# Patient Record
Sex: Male | Born: 1960 | Race: White | Hispanic: No | Smoking: Former smoker
Health system: Southern US, Community
[De-identification: ages and names within clinical notes are randomized; demographics above are authoritative.]

## PROBLEM LIST (undated history)

## (undated) DIAGNOSIS — F32A Depression, unspecified: Secondary | ICD-10-CM

## (undated) DIAGNOSIS — G8929 Other chronic pain: Secondary | ICD-10-CM

## (undated) DIAGNOSIS — E1142 Type 2 diabetes mellitus with diabetic polyneuropathy: Secondary | ICD-10-CM

## (undated) DIAGNOSIS — F419 Anxiety disorder, unspecified: Secondary | ICD-10-CM

## (undated) DIAGNOSIS — K219 Gastro-esophageal reflux disease without esophagitis: Secondary | ICD-10-CM

## (undated) DIAGNOSIS — M545 Low back pain, unspecified: Secondary | ICD-10-CM

## (undated) DIAGNOSIS — Z8679 Personal history of other diseases of the circulatory system: Secondary | ICD-10-CM

## (undated) DIAGNOSIS — I10 Essential (primary) hypertension: Secondary | ICD-10-CM

## (undated) DIAGNOSIS — I499 Cardiac arrhythmia, unspecified: Secondary | ICD-10-CM

## (undated) DIAGNOSIS — F410 Panic disorder [episodic paroxysmal anxiety] without agoraphobia: Secondary | ICD-10-CM

## (undated) DIAGNOSIS — K5900 Constipation, unspecified: Secondary | ICD-10-CM

## (undated) DIAGNOSIS — R7303 Prediabetes: Secondary | ICD-10-CM

## (undated) DIAGNOSIS — E559 Vitamin D deficiency, unspecified: Secondary | ICD-10-CM

## (undated) DIAGNOSIS — F909 Attention-deficit hyperactivity disorder, unspecified type: Secondary | ICD-10-CM

## (undated) DIAGNOSIS — M6702 Short Achilles tendon (acquired), left ankle: Secondary | ICD-10-CM

## (undated) DIAGNOSIS — M1990 Unspecified osteoarthritis, unspecified site: Secondary | ICD-10-CM

## (undated) DIAGNOSIS — M2550 Pain in unspecified joint: Secondary | ICD-10-CM

## (undated) DIAGNOSIS — I209 Angina pectoris, unspecified: Secondary | ICD-10-CM

## (undated) DIAGNOSIS — R519 Headache, unspecified: Secondary | ICD-10-CM

## (undated) DIAGNOSIS — E781 Pure hyperglyceridemia: Secondary | ICD-10-CM

## (undated) DIAGNOSIS — Z9889 Other specified postprocedural states: Secondary | ICD-10-CM

## (undated) DIAGNOSIS — K594 Anal spasm: Secondary | ICD-10-CM

## (undated) DIAGNOSIS — E119 Type 2 diabetes mellitus without complications: Secondary | ICD-10-CM

## (undated) DIAGNOSIS — R51 Headache: Secondary | ICD-10-CM

## (undated) DIAGNOSIS — F329 Major depressive disorder, single episode, unspecified: Secondary | ICD-10-CM

## (undated) DIAGNOSIS — G629 Polyneuropathy, unspecified: Secondary | ICD-10-CM

## (undated) DIAGNOSIS — E7800 Pure hypercholesterolemia, unspecified: Secondary | ICD-10-CM

## (undated) DIAGNOSIS — G4733 Obstructive sleep apnea (adult) (pediatric): Secondary | ICD-10-CM

## (undated) DIAGNOSIS — Z9989 Dependence on other enabling machines and devices: Secondary | ICD-10-CM

## (undated) DIAGNOSIS — F319 Bipolar disorder, unspecified: Secondary | ICD-10-CM

## (undated) DIAGNOSIS — K611 Rectal abscess: Secondary | ICD-10-CM

## (undated) HISTORY — DX: Pain in unspecified joint: M25.50

## (undated) HISTORY — DX: Anal spasm: K59.4

## (undated) HISTORY — DX: Short Achilles tendon (acquired), left ankle: M67.02

## (undated) HISTORY — PX: COLONOSCOPY: SHX174

## (undated) HISTORY — DX: Vitamin D deficiency, unspecified: E55.9

## (undated) HISTORY — DX: Essential (primary) hypertension: I10

## (undated) HISTORY — DX: Attention-deficit hyperactivity disorder, unspecified type: F90.9

## (undated) HISTORY — DX: Rectal abscess: K61.1

## (undated) HISTORY — PX: HERNIA REPAIR: SHX51

## (undated) HISTORY — DX: Type 2 diabetes mellitus with diabetic polyneuropathy: E11.42

## (undated) HISTORY — DX: Prediabetes: R73.03

## (undated) HISTORY — PX: SHOULDER ARTHROSCOPY: SHX128

## (undated) HISTORY — DX: Constipation, unspecified: K59.00

---

## 1982-12-28 HISTORY — PX: APPENDECTOMY: SHX54

## 1999-12-29 DIAGNOSIS — K594 Anal spasm: Secondary | ICD-10-CM

## 1999-12-29 HISTORY — DX: Anal spasm: K59.4

## 2000-05-27 ENCOUNTER — Emergency Department (HOSPITAL_COMMUNITY): Admission: EM | Admit: 2000-05-27 | Discharge: 2000-05-27 | Payer: Self-pay | Admitting: *Deleted

## 2000-08-05 ENCOUNTER — Ambulatory Visit (HOSPITAL_COMMUNITY): Admission: RE | Admit: 2000-08-05 | Discharge: 2000-08-05 | Payer: Self-pay | Admitting: General Surgery

## 2000-08-05 ENCOUNTER — Encounter (INDEPENDENT_AMBULATORY_CARE_PROVIDER_SITE_OTHER): Payer: Self-pay | Admitting: Specialist

## 2000-08-05 HISTORY — PX: ANAL FISSURE REPAIR: SHX2312

## 2000-10-01 ENCOUNTER — Encounter: Admission: RE | Admit: 2000-10-01 | Discharge: 2000-10-01 | Payer: Self-pay | Admitting: Gastroenterology

## 2000-10-01 ENCOUNTER — Encounter: Payer: Self-pay | Admitting: Gastroenterology

## 2000-10-25 ENCOUNTER — Ambulatory Visit (HOSPITAL_COMMUNITY): Admission: RE | Admit: 2000-10-25 | Discharge: 2000-10-25 | Payer: Self-pay | Admitting: Gastroenterology

## 2000-10-25 ENCOUNTER — Encounter (INDEPENDENT_AMBULATORY_CARE_PROVIDER_SITE_OTHER): Payer: Self-pay

## 2000-12-09 ENCOUNTER — Encounter: Payer: Self-pay | Admitting: Gastroenterology

## 2000-12-09 ENCOUNTER — Ambulatory Visit (HOSPITAL_COMMUNITY): Admission: RE | Admit: 2000-12-09 | Discharge: 2000-12-09 | Payer: Self-pay | Admitting: Gastroenterology

## 2001-02-14 ENCOUNTER — Encounter: Payer: Self-pay | Admitting: Family Medicine

## 2001-02-14 ENCOUNTER — Ambulatory Visit (HOSPITAL_COMMUNITY): Admission: RE | Admit: 2001-02-14 | Discharge: 2001-02-14 | Payer: Self-pay | Admitting: Family Medicine

## 2002-09-16 ENCOUNTER — Encounter: Payer: Self-pay | Admitting: Specialist

## 2002-09-16 ENCOUNTER — Ambulatory Visit (HOSPITAL_COMMUNITY): Admission: RE | Admit: 2002-09-16 | Discharge: 2002-09-16 | Payer: Self-pay | Admitting: Specialist

## 2002-11-22 ENCOUNTER — Encounter: Payer: Self-pay | Admitting: Family Medicine

## 2002-11-22 ENCOUNTER — Ambulatory Visit (HOSPITAL_COMMUNITY): Admission: RE | Admit: 2002-11-22 | Discharge: 2002-11-22 | Payer: Self-pay | Admitting: Family Medicine

## 2003-01-23 ENCOUNTER — Encounter: Payer: Self-pay | Admitting: Family Medicine

## 2003-01-23 ENCOUNTER — Ambulatory Visit (HOSPITAL_COMMUNITY): Admission: RE | Admit: 2003-01-23 | Discharge: 2003-01-23 | Payer: Self-pay | Admitting: Family Medicine

## 2003-02-10 ENCOUNTER — Ambulatory Visit (HOSPITAL_COMMUNITY): Admission: RE | Admit: 2003-02-10 | Discharge: 2003-02-10 | Payer: Self-pay | Admitting: Orthopedic Surgery

## 2003-03-13 ENCOUNTER — Ambulatory Visit (HOSPITAL_COMMUNITY): Admission: RE | Admit: 2003-03-13 | Discharge: 2003-03-13 | Payer: Self-pay | Admitting: Orthopedic Surgery

## 2003-03-13 ENCOUNTER — Encounter: Payer: Self-pay | Admitting: Orthopedic Surgery

## 2003-06-14 ENCOUNTER — Emergency Department (HOSPITAL_COMMUNITY): Admission: EM | Admit: 2003-06-14 | Discharge: 2003-06-15 | Payer: Self-pay

## 2007-03-10 ENCOUNTER — Ambulatory Visit (HOSPITAL_BASED_OUTPATIENT_CLINIC_OR_DEPARTMENT_OTHER): Admission: RE | Admit: 2007-03-10 | Discharge: 2007-03-10 | Payer: Self-pay | Admitting: Otolaryngology

## 2007-03-13 ENCOUNTER — Ambulatory Visit: Payer: Self-pay | Admitting: Internal Medicine

## 2007-03-14 ENCOUNTER — Encounter: Admission: RE | Admit: 2007-03-14 | Discharge: 2007-03-14 | Payer: Self-pay | Admitting: Otolaryngology

## 2007-04-14 ENCOUNTER — Emergency Department (HOSPITAL_COMMUNITY): Admission: EM | Admit: 2007-04-14 | Discharge: 2007-04-15 | Payer: Self-pay | Admitting: Emergency Medicine

## 2007-08-08 ENCOUNTER — Ambulatory Visit (HOSPITAL_COMMUNITY): Admission: RE | Admit: 2007-08-08 | Discharge: 2007-08-09 | Payer: Self-pay | Admitting: Orthopedic Surgery

## 2007-08-08 HISTORY — PX: SHOULDER ARTHROSCOPY W/ LABRAL REPAIR: SHX2399

## 2007-08-10 ENCOUNTER — Emergency Department (HOSPITAL_COMMUNITY): Admission: EM | Admit: 2007-08-10 | Discharge: 2007-08-10 | Payer: Self-pay | Admitting: Emergency Medicine

## 2008-09-19 ENCOUNTER — Ambulatory Visit: Payer: Self-pay | Admitting: Orthopedic Surgery

## 2008-09-19 DIAGNOSIS — M546 Pain in thoracic spine: Secondary | ICD-10-CM | POA: Insufficient documentation

## 2008-09-19 DIAGNOSIS — M545 Low back pain, unspecified: Secondary | ICD-10-CM | POA: Insufficient documentation

## 2008-09-19 DIAGNOSIS — M542 Cervicalgia: Secondary | ICD-10-CM

## 2008-09-19 DIAGNOSIS — M479 Spondylosis, unspecified: Secondary | ICD-10-CM | POA: Insufficient documentation

## 2008-09-19 HISTORY — DX: Spondylosis, unspecified: M47.9

## 2008-09-19 HISTORY — DX: Pain in thoracic spine: M54.6

## 2008-09-19 HISTORY — DX: Low back pain, unspecified: M54.50

## 2008-09-21 ENCOUNTER — Telehealth: Payer: Self-pay | Admitting: Orthopedic Surgery

## 2008-09-24 ENCOUNTER — Encounter: Payer: Self-pay | Admitting: Orthopedic Surgery

## 2008-10-04 ENCOUNTER — Ambulatory Visit: Payer: Self-pay | Admitting: Orthopedic Surgery

## 2008-10-04 ENCOUNTER — Telehealth: Payer: Self-pay | Admitting: Orthopedic Surgery

## 2008-10-09 ENCOUNTER — Encounter: Payer: Self-pay | Admitting: Orthopedic Surgery

## 2008-10-09 ENCOUNTER — Telehealth: Payer: Self-pay | Admitting: Orthopedic Surgery

## 2008-10-09 ENCOUNTER — Encounter: Admission: RE | Admit: 2008-10-09 | Discharge: 2008-12-07 | Payer: Self-pay | Admitting: Orthopedic Surgery

## 2008-10-10 ENCOUNTER — Encounter: Payer: Self-pay | Admitting: Orthopedic Surgery

## 2008-10-11 ENCOUNTER — Encounter: Admission: RE | Admit: 2008-10-11 | Discharge: 2008-10-11 | Payer: Self-pay | Admitting: Orthopedic Surgery

## 2008-10-11 ENCOUNTER — Telehealth: Payer: Self-pay | Admitting: Orthopedic Surgery

## 2008-10-12 ENCOUNTER — Encounter: Payer: Self-pay | Admitting: Orthopedic Surgery

## 2008-10-15 ENCOUNTER — Telehealth: Payer: Self-pay | Admitting: Orthopedic Surgery

## 2008-10-16 ENCOUNTER — Telehealth: Payer: Self-pay | Admitting: Orthopedic Surgery

## 2008-10-17 ENCOUNTER — Telehealth: Payer: Self-pay | Admitting: Orthopedic Surgery

## 2008-10-22 ENCOUNTER — Telehealth: Payer: Self-pay | Admitting: Orthopedic Surgery

## 2008-10-23 ENCOUNTER — Telehealth: Payer: Self-pay | Admitting: Orthopedic Surgery

## 2008-10-24 ENCOUNTER — Encounter: Payer: Self-pay | Admitting: Orthopedic Surgery

## 2008-10-26 ENCOUNTER — Encounter: Payer: Self-pay | Admitting: Orthopedic Surgery

## 2008-11-02 ENCOUNTER — Telehealth: Payer: Self-pay | Admitting: Orthopedic Surgery

## 2008-11-07 ENCOUNTER — Encounter: Payer: Self-pay | Admitting: Orthopedic Surgery

## 2008-11-16 ENCOUNTER — Encounter: Payer: Self-pay | Admitting: Orthopedic Surgery

## 2008-11-23 ENCOUNTER — Encounter: Payer: Self-pay | Admitting: Orthopedic Surgery

## 2008-12-03 ENCOUNTER — Ambulatory Visit (HOSPITAL_COMMUNITY): Admission: RE | Admit: 2008-12-03 | Discharge: 2008-12-03 | Payer: Self-pay | Admitting: Neurological Surgery

## 2008-12-07 ENCOUNTER — Encounter: Payer: Self-pay | Admitting: Orthopedic Surgery

## 2009-01-08 ENCOUNTER — Encounter: Admission: RE | Admit: 2009-01-08 | Discharge: 2009-01-08 | Payer: Self-pay | Admitting: Neurological Surgery

## 2009-01-15 ENCOUNTER — Telehealth: Payer: Self-pay | Admitting: Orthopedic Surgery

## 2009-01-24 ENCOUNTER — Telehealth: Payer: Self-pay | Admitting: Orthopedic Surgery

## 2009-02-04 ENCOUNTER — Telehealth: Payer: Self-pay | Admitting: Orthopedic Surgery

## 2009-02-07 ENCOUNTER — Encounter: Payer: Self-pay | Admitting: Orthopedic Surgery

## 2009-02-20 ENCOUNTER — Encounter: Payer: Self-pay | Admitting: Orthopedic Surgery

## 2009-03-13 ENCOUNTER — Encounter: Payer: Self-pay | Admitting: Orthopedic Surgery

## 2009-10-24 ENCOUNTER — Encounter: Payer: Self-pay | Admitting: Orthopedic Surgery

## 2009-10-31 ENCOUNTER — Telehealth: Payer: Self-pay | Admitting: Orthopedic Surgery

## 2009-12-24 ENCOUNTER — Ambulatory Visit: Payer: Self-pay | Admitting: Diagnostic Radiology

## 2009-12-24 ENCOUNTER — Emergency Department (HOSPITAL_BASED_OUTPATIENT_CLINIC_OR_DEPARTMENT_OTHER): Admission: EM | Admit: 2009-12-24 | Discharge: 2009-12-24 | Payer: Self-pay | Admitting: Emergency Medicine

## 2009-12-28 HISTORY — PX: COLONOSCOPY: SHX174

## 2010-05-11 ENCOUNTER — Encounter: Payer: Self-pay | Admitting: Orthopedic Surgery

## 2010-10-02 ENCOUNTER — Telehealth: Payer: Self-pay | Admitting: Orthopedic Surgery

## 2010-10-08 ENCOUNTER — Ambulatory Visit: Payer: Self-pay | Admitting: Orthopedic Surgery

## 2010-10-08 ENCOUNTER — Encounter (INDEPENDENT_AMBULATORY_CARE_PROVIDER_SITE_OTHER): Payer: Self-pay | Admitting: *Deleted

## 2010-10-09 ENCOUNTER — Encounter: Payer: Self-pay | Admitting: Orthopedic Surgery

## 2010-10-15 ENCOUNTER — Telehealth: Payer: Self-pay | Admitting: Orthopedic Surgery

## 2010-10-20 ENCOUNTER — Telehealth (INDEPENDENT_AMBULATORY_CARE_PROVIDER_SITE_OTHER): Payer: Self-pay | Admitting: *Deleted

## 2010-10-21 ENCOUNTER — Telehealth: Payer: Self-pay | Admitting: Orthopedic Surgery

## 2010-10-24 ENCOUNTER — Encounter: Admission: RE | Admit: 2010-10-24 | Discharge: 2010-10-24 | Payer: Self-pay | Admitting: Orthopedic Surgery

## 2010-10-27 ENCOUNTER — Encounter: Payer: Self-pay | Admitting: Orthopedic Surgery

## 2010-10-27 ENCOUNTER — Ambulatory Visit (HOSPITAL_COMMUNITY): Admission: RE | Admit: 2010-10-27 | Discharge: 2010-10-27 | Payer: Self-pay | Admitting: General Surgery

## 2010-10-27 HISTORY — PX: UMBILICAL HERNIA REPAIR: SHX196

## 2010-10-28 ENCOUNTER — Telehealth: Payer: Self-pay | Admitting: Orthopedic Surgery

## 2010-11-03 ENCOUNTER — Ambulatory Visit: Payer: Self-pay | Admitting: Diagnostic Radiology

## 2010-11-03 ENCOUNTER — Encounter: Payer: Self-pay | Admitting: Emergency Medicine

## 2010-11-03 ENCOUNTER — Observation Stay (HOSPITAL_COMMUNITY): Admission: EM | Admit: 2010-11-03 | Discharge: 2010-11-04 | Payer: Self-pay | Admitting: Emergency Medicine

## 2010-11-04 ENCOUNTER — Encounter (INDEPENDENT_AMBULATORY_CARE_PROVIDER_SITE_OTHER): Payer: Self-pay | Admitting: Emergency Medicine

## 2010-11-12 ENCOUNTER — Encounter: Admission: RE | Admit: 2010-11-12 | Discharge: 2010-11-12 | Payer: Self-pay | Admitting: Orthopedic Surgery

## 2011-01-18 ENCOUNTER — Encounter: Payer: Self-pay | Admitting: Family Medicine

## 2011-01-27 NOTE — Progress Notes (Signed)
Summary: followup on MRI  Phone Note Call from Patient   Summary of Call: Gary Grimes left a message for you to call him to followup on MRI results. Per carol, report is on your desk. Gary Grimes has the film. His # [id number] Initial call taken by: Gary Grimes,  October 15, 2010 2:14 PM

## 2011-01-27 NOTE — Progress Notes (Signed)
Summary: Having severe back pain  Phone Note Call from Patient   Caller: Patient Call For: Dr. Romeo Apple Summary of Call: Patient is having a bad back episode since Monday, with alot of pain. He has been taking Ibuprofen, Tramadol, and using a Tens unit. He has [employer] and he thinks that Dr. Danielle Dess needs a new referral. I told him to call Dr. Verlee Rossetti office to see if they could do anything for him until you could. His # is [phone]. I told him you would not be in the office until Monday. Initial call taken by: Waldon Reining,  October 02, 2010 4:54 PM

## 2011-01-27 NOTE — Progress Notes (Signed)
Summary: call back from patient about order for [facility]  Phone Note Call from Patient   Caller: Patient Summary of Call: Patient called to relay that per Dr Harrison's call to him over the weekend, Dr Romeo Apple advised  "call to have the order writen for epidural injections"  at [facility].   Please advise. Patient contact phone/Cell ph [phone] Initial call taken by: Cammie Sickle,  October 20, 2010 1:32 PM  Follow-up for Phone Call        schedule for Lumbar ESI series as needed @ L2-3  (To schedule with [facility] for F/U ref back   nd neck) Follow-up by: Fuller Canada MD,  October 20, 2010 1:59 PM  Additional Follow-up for Phone Call Additional follow up Details #1::        Okey Regal, I don't know or have any info to refer to [facility], do you have a book? Additional Follow-up by: Waldon Reining,  October 21, 2010 10:49 AM    Additional Follow-up for Phone Call Additional follow up Details #2::    On 10/21/10 - I ordered a [facility] referral book.  Also printed information from from website [facility], Neurosurgery Dept. Also contacted patient on 10/21/10 to inquire about any particular specialists there, as Dr Romeo Apple noted that patient may know which physician he wants to see.  Patient said no certain physician.  10/22/10, message left w/ Neuro clinic, ph [phone] re: referral process. Also tried Ph [phone] - direct referral #, person out of office until tomorrow. Follow-up by: Cammie Sickle,  October 22, 2010 4:34 PM  Additional Follow-up for Phone Call Additional follow up Details #3:: Details for Additional Follow-up Action Taken: 10/24/10 spoke with new patient referral coordinator, Shama.  Faxed referral and notes to [phone] to her attn, at Ph [phone].  Their nurse reviewer will contact patient fol'g review of notes. Additional Follow-up by: Cammie Sickle,  October 24, 2010 12:44 PM

## 2011-01-27 NOTE — Progress Notes (Signed)
Summary: ESI referral.  Phone Note Outgoing Call   Call placed by: Waldon Reining,  October 21, 2010 11:12 AM Call placed to: Specialist Action Taken: Information Sent Summary of Call: I faxed a referral to [facility] for patient to have ESI's.

## 2011-01-27 NOTE — Miscellaneous (Signed)
Summary: called insurer MRI pre-cert rec'd,order faxed to [facility]  Clinical Lists Changes  Contacted [employer] to request pre-cert for MRI. ph [phone]; spoke w/Ms. Jean Rosenthal, clinical reviewer support rep, e:  MRI C-spine CPT [id number] - approved Notif # [phone]                       MRI L-spine CPT [phone] - approved, Notif # [phone]      pre-auth expiration 11/23/10  Patient requests MRI schedule at either [facility] or [facility]. I called and scheduled both at [facility] (pt has had imaging there previously) ph [phone] / fax [phone]: Order faxed.   Appointment today, 10/09/10 at 8:00pm for both.  Called patient and left voice msg. [facility] [phone])  ** Patient returned call.  Reminded to obtain copy of films. [facility] to call or fax results.

## 2011-01-27 NOTE — Miscellaneous (Signed)
  called in c/o severe back pain bilateral leg pain weakness and inability to stand up   pain radiating to c spine   Clinical Lists Changes

## 2011-01-27 NOTE — Letter (Signed)
Summary: [employer] CMN TENS NMES device  [employer] CM[employer] TENS NMES device   Imported By: Cammie Sickle 05/22/2010 19:16:06  _____________________________________________________________________  External Attachment:    Type:   Image     Comment:   External Document

## 2011-01-27 NOTE — Letter (Signed)
Summary: [facility] Dept Neurosurgery notice  [facility] Dept Neurosurgery notice   Imported By: Cammie Sickle 10/27/2010 13:08:54  _____________________________________________________________________  External Attachment:    Type:   Image     Comment:   External Document

## 2011-01-27 NOTE — Letter (Signed)
Summary: Records and film transfer request  Records and film transfer request   Imported By: Cammie Sickle 04/24/2010 19:15:58  _____________________________________________________________________  External Attachment:    Type:   Image     Comment:   External Document

## 2011-01-27 NOTE — Letter (Signed)
Summary: [employer] prior notification  [facility] prior notification   Imported By: Cammie Sickle 10/10/2010 10:13:12  _____________________________________________________________________  External Attachment:    Type:   Image     Comment:   External Document

## 2011-01-27 NOTE — Letter (Signed)
Summary: MRI order  MRI order   Imported By: Cammie Sickle 10/08/2010 19:39:41  _____________________________________________________________________  External Attachment:    Type:   Image     Comment:   External Document

## 2011-01-27 NOTE — Progress Notes (Signed)
Summary: call to patient response to referral  Phone Note Outgoing Call   Call placed to: Patient Summary of Call: Called patient to relay referral response from [facility]) Neurosurgery following review. Lft voice mail message cell #[phone]. Copy of notice scanned into chart.  States referral reviewed by nurse.  No appointment scheduled.  Their recommendation is physical therapy and/or medication.  Dr Romeo Apple reviewed and signed their note.   Initial call taken by: Cammie Sickle,  October 28, 2010 10:03 AM  Follow-up for Phone Call        Patient returned call.  I relayed information that no referral appointment is being scheduled per [facility].  Patient states he had his first epidural injection Fri 10/24/10 at [facility] imaging and is feeling much relief, "like having a new back."  States he is scheduled for 2nd injection 11/10/10.   Follow-up by: Cammie Sickle,  October 28, 2010 10:30 AM

## 2011-03-10 LAB — POCT CARDIAC MARKERS
CKMB, poc: 2.3 ng/mL (ref 1.0–8.0)
CKMB, poc: 3.2 ng/mL (ref 1.0–8.0)
CKMB, poc: 2.6 ng/mL (ref 1.0–8.0)
Myoglobin, poc: 131 ng/mL (ref 12–200)
Myoglobin, poc: 137 ng/mL (ref 12–200)
Myoglobin, poc: 120 ng/mL (ref 12–200)
Troponin i, poc: <0.05 ng/mL (ref 0.00–0.09)
Troponin i, poc: <0.05 ng/mL (ref 0.00–0.09)
Troponin i, poc: <0.05 ng/mL (ref 0.00–0.09)

## 2011-03-10 LAB — BASIC METABOLIC PANEL
BUN: 19 mg/dL (ref 6–23)
Chloride: 101 mEq/L (ref 96–112)
GFR calc non Af Amer: >60 mL/min (ref >60)
Glucose, Bld: 105 mg/dL — ABNORMAL HIGH (ref 70–99)
Potassium: 4.6 mEq/L (ref 3.5–5.1)

## 2011-03-10 LAB — D-DIMER, QUANTITATIVE: D-Dimer, Quant: <0.22 ug/mL-FEU (ref 0.00–0.48)

## 2011-03-10 LAB — CBC
HCT: 45.8 % (ref 39.0–52.0)
MCHC: 35.3 g/dL (ref 30.0–36.0)
MCV: 89.7 fL (ref 78.0–100.0)
RDW: 12.2 % (ref 11.5–15.5)

## 2011-03-11 LAB — BASIC METABOLIC PANEL
Chloride: 101 mEq/L (ref 96–112)
GFR calc Af Amer: >60 mL/min (ref >60)
Potassium: 3.8 mEq/L (ref 3.5–5.1)

## 2011-03-11 LAB — SURGICAL PCR SCREEN: MRSA, PCR: NEGATIVE

## 2011-04-28 ENCOUNTER — Other Ambulatory Visit: Payer: Self-pay | Admitting: Neurological Surgery

## 2011-04-28 DIAGNOSIS — M542 Cervicalgia: Secondary | ICD-10-CM

## 2011-04-29 ENCOUNTER — Ambulatory Visit
Admission: RE | Admit: 2011-04-29 | Discharge: 2011-04-29 | Disposition: A | Discharge status: Home / Self Care | Payer: 59 | Source: Ambulatory Visit | Attending: Neurological Surgery | Admitting: Neurological Surgery

## 2011-04-29 DIAGNOSIS — M542 Cervicalgia: Secondary | ICD-10-CM

## 2011-05-12 NOTE — Op Note (Signed)
Gary Grimes, Gary Grimes               ACCOUNT NO.:  [id number]   MEDICAL RECORD NO.:  [id number]          PATIENT TYPE:  OIB   LOCATION:  5040                         FACILITY:  [facility]   PHYSICIAN:  Almedia Balls. Ranell Patrick, M.D. DATE OF BIRTH:  08/02/1961   DATE OF PROCEDURE:  08/08/2007  DATE OF DISCHARGE:                               OPERATIVE REPORT   PREOPERATIVE DIAGNOSIS:  Left shoulder impingement, osteoarthritis and  acromioclavicular joint arthritis.   POSTOPERATIVE DIAGNOSIS:  Left shoulder impingement, partial-thickness  rotator cuff tear, superior labral tear anterior to posterior,  acromioclavicular joint degenerative joint disease, glenohumeral joint  degenerative joint disease.   PROCEDURE PERFORMED:  Left shoulder examination under anesthesia,  shoulder arthroscopy with extensive intra-articular debridement  including debridement of partial-thickness rotator cuff tear, labral  debridement including SLAP debridement as well as debridement of  posterior inferior labral tear, arthroscopic biceps tenotomy,  arthroscopic subacromial decompression, mini open biceps tenodesis in  the groove, followed by open distal clavicle excision.   ASSISTANT SURGEON:  Almedia Balls. Ranell Patrick, M.D.   ASSISTANT SURGEON:  Donnie Coffin. Durwin Nora, P.A.   ANESTHESIA:  General anesthesia plus interscalene block anesthesia was  used.   ESTIMATED BLOOD LOSS:  Minimal.   FLUID REPLACEMENT:  1200 mL crystalloid.   SURGICAL COUNTS:  Instrument counts were correct.   COMPLICATIONS:  None.   MEDICATIONS UTILIZED:  Perioperative antibiotics were given.   INDICATIONS FOR PROCEDURE:  The patient is a 50 year old male with a  history of worsening left shoulder pain secondary to osteoarthritis in  the [facility] joint as well as some impingement findings.  The patient has  failed conservative management consisting of multiple injections,  activity modifications, antiinflammatories, and the patient presents now  for  operative treatment given his continued debilitating pain and  impaired function.  Informed consent was obtained.   DESCRIPTION OF PROCEDURE:  After an adequate level of anesthesia was  achieved, the patient was positioned in the modified beach-chair  position.  All neurovascular structures were padded appropriately.  The  left shoulder was sterilely prepped and draped in the usual manner.  The  patient was noted on EUA to have normal passive range of motion of the  shoulder with no instability, no undue tightness.  Three standard  arthroscopic portals were created in the usual manner, including  infiltration of the skin with 0.25% Marcaine with epinephrine, followed  by incision with the #11 blade scalpel and introduction of a cannula  into the joint using blunt obturators.   Diagnostic arthroscopy revealed torn superior labrum, anterior to  posterior, with extensive tearing in the biceps.  The biceps was  released using basket forceps and a motorized shaver.  A thorough labral  debridement was performed using basket forceps and the motorized shaver.  The patient had tearing into the anterior inferior labrum and extensive  posterior labral tearing, all debrided using the motorized shaver and  basket forceps.  The middle glenohumeral ligament was normal.  The  subscapularis was normal.  The rotator cuff had a tear which was an  undersurface articular-sided tear of less than 25% of  the thickness of  the tendon.  We performed debridement of that tendon tear, including  scuffing up the rotator cuff footprint to encourage healing.  The  posterior portion of the cuff was normal.   The glenohumeral articular cartilage showed grade III and grade IV  changes with some complete eburnated bone on the posterior inferior  portion of the glenoid, adjacent a degenerative tear in the posterior  inferior labrum which was debrided using basket forceps and the  motorized shaver.  Following completion of  the labral debridement,  biceps tenotomy and the debridement of the partial-thickness rotator  cuff tear, we placed the scope in the subacromial space.  Thorough  bursectomy and acromioplasty were performed, including release of the CA  ligament.  The rotator cuff appeared pristine from the bursal surface.  We then performed a saber incision overlying the AC joint.  Dissection  was carried sharply down through subcutaneous tissues.  The  deltotrapezius fascia was split along with the distal clavicle.  Subperiosteal dissection of the distal clavicle was performed followed  by excision of the distal 5 mm of the clavicle using the oscillating  saw.  Thorough irrigation of the AC interval was performed, followed by  application of bone wax in clavicle.  We then repaired the  deltotrapezius fascia using interrupted figure-of-eight 0 Vicryl suture;  followed by 2-0 Vicryl for the subcutaneous closure; and 4-0 Monocryl  for the skin.   Next the deltoid was split in its raphe between the anterolateral heads.  Dissection was carried sharply down through subcutaneous tissues to the  biceps groove where we identified the soft tissue overlying the biceps  groove and incised that using Bovie electrocautery, delivered the  tendon, whipstitched the biceps tendon using FiberWire suture; and then  tenodesed at mid tension with the elbow at 90 degrees using a 7 x 23 mm  Arthrex arthrodesis screw, gaining purchase on the tendon.  Next we went  ahead and thoroughly irrigated and then closed the deltoid to itself  with interrupted 0 Vicryl suture in the simple fashion, followed by 2-0  Vicryl subcutaneous closure and 4-0 Monocryl for the skin.  Steri-Strips  were applied, followed by a sterile dressing.  The patient tolerated the  surgery well.      Almedia Balls. Ranell Patrick, M.D.  Electronically Signed     SRN/MEDQ  D:  08/08/2007  T:  08/08/2007  Job:  [id number]

## 2011-05-12 NOTE — Discharge Summary (Signed)
Gary Grimes, Gary Grimes               ACCOUNT NO.:  [id number]   MEDICAL RECORD NO.:  [id number]          PATIENT TYPE:  OIB   LOCATION:  5040                         FACILITY:  [facility]   PHYSICIAN:  Almedia Balls. Ranell Patrick, M.D. DATE OF BIRTH:  07/12/1961   DATE OF ADMISSION:  08/08/2007  DATE OF DISCHARGE:                               DISCHARGE SUMMARY   ADMISSION DIAGNOSIS:  Left shoulder pain and impingement.   DISCHARGE DIAGNOSES:  1. Left shoulder impingement.  2. Partial-thickness rotator cuff tear.  3. Superior labrum from anterior to posterior lesion.   BRIEF HISTORY:  The patient is a 50 year old male with worsening pain to  the left shoulder.  The patient has elected to have a left shoulder  arthroscopy by Dr. Almedia Balls. Norris.   PROCEDURES:  1. The patient had a left shoulder arthroscopy.  2. Biceps tenesmus and debridement of a partial-thickness rotator cuff      tear on August 08, 2007.   ASSISTANT:  Donnie Coffin. Dixon, P.A.-C.   ANESTHESIA:  General anesthesia was used with an interscalene block.   COMPLICATIONS:  None.   ESTIMATED BLOOD LOSS:  Minimal.   HOSPITAL COURSE:  The patient was admitted on August 08, 2007, for the  above stated procedure which is tolerated well after adequate post  anesthesia care and he was transferred up to 5000.  On postoperative day  number one, the patient complained about some moderate pain to that left  shoulder but, otherwise, was doing okay.  He had a fairly restful night.  Neurovascularly he was intact.  Sling and dressing were in place.  He  went through gentle range of motion before discharge home.   DISCHARGE PLAN:  The patient will be discharged home on August 09, 2007.   CONDITION ON DISCHARGE:  Stable.   DIET:  Regular.   The patient had no known drug allergies.   DISCHARGE MEDICATIONS:  1. Wellbutrin 150 mg p.o. q.d.  2. Robaxin 500 mg p.o. q.6h.  3. Trazodone 75 mg p.o. q. h.s.  4. Percocet one to two tabs q.4-6h.  p.r.n. pain and the dose is 5/325.      Thomas B. Durwin Nora, P.A.      Almedia Balls. Ranell Patrick, M.D.  Electronically Signed    TBD/MEDQ  D:  08/09/2007  T:  08/09/2007  Job:  [id number]

## 2011-05-13 ENCOUNTER — Emergency Department (HOSPITAL_BASED_OUTPATIENT_CLINIC_OR_DEPARTMENT_OTHER)
Admission: EM | Admit: 2011-05-13 | Discharge: 2011-05-13 | Disposition: A | Discharge status: Home / Self Care | Payer: 59 | Attending: Emergency Medicine | Admitting: Emergency Medicine

## 2011-05-13 ENCOUNTER — Emergency Department (INDEPENDENT_AMBULATORY_CARE_PROVIDER_SITE_OTHER): Payer: 59

## 2011-05-13 DIAGNOSIS — R079 Chest pain, unspecified: Secondary | ICD-10-CM | POA: Insufficient documentation

## 2011-05-13 DIAGNOSIS — M79609 Pain in unspecified limb: Secondary | ICD-10-CM | POA: Insufficient documentation

## 2011-05-13 DIAGNOSIS — I10 Essential (primary) hypertension: Secondary | ICD-10-CM | POA: Insufficient documentation

## 2011-05-13 LAB — DIFFERENTIAL
Basophils Relative: 0 % (ref 0–1)
Eosinophils Relative: 1 % (ref 0–5)
Lymphocytes Relative: 19 % (ref 12–46)
Monocytes Relative: 10 % (ref 3–12)
Neutrophils Relative %: 70 % (ref 43–77)

## 2011-05-13 LAB — COMPREHENSIVE METABOLIC PANEL
ALT: 24 U/L (ref 0–53)
Albumin: 3.8 g/dL (ref 3.5–5.2)
Calcium: 9.3 mg/dL (ref 8.4–10.5)
Glucose, Bld: 131 mg/dL — ABNORMAL HIGH (ref 70–99)
Potassium: 3.9 mEq/L (ref 3.5–5.1)
Sodium: 137 mEq/L (ref 135–145)
Total Protein: 6.5 g/dL (ref 6.0–8.3)

## 2011-05-13 LAB — LIPASE, BLOOD: Lipase: 55 U/L (ref 11–59)

## 2011-05-13 LAB — CARDIAC PANEL(CRET KIN+CKTOT+MB+TROPI)
Relative Index: 1.9 (ref 0.0–2.5)
Total CK: 338 U/L — ABNORMAL HIGH (ref 7–232)
Troponin I: <0.3 ng/mL (ref <0.30)

## 2011-05-13 LAB — CBC
HCT: 41.7 % (ref 39.0–52.0)
MCV: 86 fL (ref 78.0–100.0)
Platelets: 292 10*3/uL (ref 150–400)
RBC: 4.85 MIL/uL (ref 4.22–5.81)
WBC: 11.2 10*3/uL — ABNORMAL HIGH (ref 4.0–10.5)

## 2011-05-13 LAB — D-DIMER, QUANTITATIVE: D-Dimer, Quant: <0.22 ug/mL-FEU (ref 0.00–0.48)

## 2011-05-15 NOTE — Procedures (Signed)
NAME:  Gary Grimes, Gary Grimes NO.:  [id number]   MEDICAL RECORD NO.:  [id number]          PATIENT TYPE:  OUT   LOCATION:  SLEEP CENTER                 FACILITY:  [facility]   PHYSICIAN:  Clinton D. Maple Hudson, MD, FCCP, FACPDATE OF BIRTH:  10/31/[birth year]   DATE OF STUDY:                            NOCTURNAL POLYSOMNOGRAM   REFERRING PHYSICIAN:  Cristal Deer E. Ezzard Standing, M.D.   INDICATION FOR STUDY:  Hypersomnia with sleep apnea.  Epworth's  sleepiness score 7/24.   SLEEP ARCHITECTURE:  Total sleep time 314 minutes with sleep efficiency  69%.  Stage I was 12%, stage II 63%, stages III and IV 4%, REM 21% of  total sleep time.  Sleep latency 7 minutes, REM latency 215 minutes,  awake after sleep onset 136 minutes.  Arousal index 11.6.  NyQuil was  taken at 9:30 p.m.   RESPIRATORY DATA:  Split study protocol.  Apnea/hypopnea index (AHI,  RDI) 89.3 obstructive events per hour indicating severe obstructive  sleep apnea/hypopnea syndrome before CPAP.  There were 105 obstructive  apneas and 81 hypopneas before CPAP.  Events were not positional.  REM  AHI 2.7.  CPAP was titrated to 19 CWP, AHI 2.2 per hour.  The patient  chose a large Respironics comfort full mask with heated humidifier.   OXYGEN DATA:  Moderate to loud snoring with oxygen desaturation to a  nadir of 79%.  Mean oxygen saturation after CPAP control was 95% on room  air.   CARDIAC DATA:  Normal sinus rhythm.   MOVEMENT/PARASOMNIA:  Occasional limb jerk with little effect on sleep.   IMPRESSION/RECOMMENDATIONS:  1. Severe obstructive sleep apnea/hypopnea syndrome, AHI 89.3 per hour      with nonpositional events, moderate to loud snoring, and oxygen      desaturation to 79%.  2. Successful CPAP titration to 19 CWP, AHI 2.2 per hour.  A large      comfort full mask was used with heated humidifier.      Clinton D. Maple Hudson, MD, [facility], FACP  Diplomate, Biomedical engineer of Sleep Medicine  Electronically Signed     CDY/MEDQ   D:  03/13/2007 15:52:33  T:  03/14/2007 09:43:03  Job:  [id number]

## 2011-05-15 NOTE — Procedures (Signed)
[facility]  Patient:    Gary Grimes, Gary Grimes                      MRN: [id number] Proc. Date: 10/25/00 Adm. Date:  [id number] Attending:  Rich Brave CC:         Sheppard Plumber. Earlene Plater, M.D.  Robert A. Eliezer Lofts., M.D.   Procedure Report  PROCEDURE:  Colonoscopy with biopsies.  INDICATION:  A 50 year old with intermittent diarrhea suggestive of irritable bowel syndrome.  FINDINGS:  Normal examination to the terminal ileum.  DESCRIPTION OF PROCEDURE:  The patient provided written consent for the procedure.  Sedation was droperidol 5 mg, fentanyl 75 mcg, and Versed 10 mg IV, without arrhythmias or desaturation.  The Olympus adult video colonoscope was advanced without difficulty to the terminal ileum, which had a normal appearance.  Pullback was then performed. The quality of the prep was excellent, and it was felt that all areas were well-seen.  Random mucosal biopsies were obtained from the terminal ileum and also along the length of the colon.  Retroflexion in the rectum was unremarkable.  This was a normal examination.  No polyps, cancer, colitis, vascular malformations, or diverticular disease were observed.  The patient tolerated the procedure well, and there were no apparent complications.  IMPRESSION:  Normal colonoscopic evaluation, without source of patients diarrhea or proctalgia symptoms endoscopically evident.  PLAN:  Await pathology on random mucosal biopsies. DD:  10/25/00 TD:  10/26/00 Job: [id number]

## 2011-05-15 NOTE — Op Note (Signed)
[facility]  Patient:    Gary Grimes, Gary Grimes                      MRN: [id number] Proc. Date: 08/05/00 Adm. Date:  [id number] Attending:  Carson Myrtle                           Operative Report  PREOPERATIVE DIAGNOSIS:  Anal fissure, rule out abscess.  POSTOPERATIVE DIAGNOSIS:  Anal fissure.  OPERATION PERFORMED:  Examination under anesthesia, proctoscopy and repair of anal fissure.  SURGEON:  Kendrick Ranch, M.D.  ANESTHESIA:  General.  HISTORY OF PRESENT ILLNESS:  Mr. Karge is a very stressed and busy 50 year old Caucasian male who has had an anal fissure intermittently for the past 6 months. He had an extreme increase in pain and discomfort in the past 2 weeks. When seen in the office with minimal examination, it was felt he had an abscess probably complicating an anal fissure. He was advised to have surgery urgently and started on Cipro and Percocet and presents now for definitive surgery.  DESCRIPTION OF PROCEDURE:  The patient was brought to the operating room, placed supine, LMA anesthesia provided. He was placed in lithotomy, perianal area inspected, proctoscopy accomplished to 20 cm and was normal except for solid stool some of which was removed. The scope was withdrawn. The area was prepped and draped in the usual fashion. A deep angry posterior anal fissure was present but there was no evidence of abscesses. The fissure was debrided and well cauterized out to the perianal skin. A left posterior internal sphincterotomy accomplished percutaneously with a 15 blade. The external sphincter was intact. There was no complication. No other pathology. Gelfoam gauze and dry sterile dressing applied. A 0.5% Marcaine with epinephrine had been used throughout for local anesthesia. A dry sterile dressing applied. He was awakened and taken to the recovery room in good condition.  Careful written and verbal instructions were given to him and his wife.  He has Percocet and Cipro which he will continue and he will be followed in the office. DD:  08/05/00 TD:  08/05/00 Job: [id number]

## 2011-07-23 ENCOUNTER — Encounter (INDEPENDENT_AMBULATORY_CARE_PROVIDER_SITE_OTHER): Payer: Self-pay | Admitting: General Surgery

## 2011-07-23 ENCOUNTER — Ambulatory Visit (INDEPENDENT_AMBULATORY_CARE_PROVIDER_SITE_OTHER): Payer: 59 | Admitting: General Surgery

## 2011-07-23 VITALS — BP 138/86 | HR 78 | Temp 98.2°F | Ht 71.0 in | Wt 259.2 lb

## 2011-07-23 DIAGNOSIS — K439 Ventral hernia without obstruction or gangrene: Secondary | ICD-10-CM | POA: Insufficient documentation

## 2011-07-23 NOTE — Progress Notes (Signed)
Patient returns for followup of his recurrent ventral hernia with history of open umbilical hernia repair with ventral patch. He thinks the hernia is getting gradually a little bit larger.He would like to get this repaired of this fall as we had previously planned.  On examination today is a small to moderate sized ventral hernia presenting above the umbilicus. It is soft nontender and reducible.  Assessment and plan: Recurrent ventral hernia. I have recommended laparoscopic repair with a larger piece of mesh. We discussed the nature of the surgery and risks of anesthetic complications, bleeding, infection, intestinal injury, and recurrent hernia. We will schedule this at his convenience.

## 2011-07-23 NOTE — Patient Instructions (Signed)
Office will call schedule surgery

## 2011-08-05 ENCOUNTER — Other Ambulatory Visit (HOSPITAL_COMMUNITY): Payer: 59

## 2011-08-11 ENCOUNTER — Ambulatory Visit (HOSPITAL_COMMUNITY): Admission: RE | Admit: 2011-08-11 | Payer: 59 | Source: Ambulatory Visit | Admitting: General Surgery

## 2011-10-12 LAB — DIFFERENTIAL
Lymphocytes Relative: 19
Lymphs Abs: 2
Neutrophils Relative %: 68

## 2011-10-12 LAB — CBC
MCHC: 34.6
Platelets: 318
RBC: 5.07
RBC: 5.39
WBC: 10.3
WBC: 9.9

## 2011-10-12 LAB — BASIC METABOLIC PANEL
BUN: 17
Calcium: 9.6
Creatinine, Ser: 1.17
Creatinine, Ser: 1.21
GFR calc Af Amer: >60
GFR calc Af Amer: >60
GFR calc non Af Amer: >60
GFR calc non Af Amer: >60
Potassium: 4.3

## 2011-10-12 LAB — PROTIME-INR
INR: 1
Prothrombin Time: 12.9

## 2011-10-12 LAB — D-DIMER, QUANTITATIVE: D-Dimer, Quant: 0.26

## 2011-10-12 LAB — APTT: aPTT: 28

## 2012-01-26 ENCOUNTER — Telehealth: Payer: Self-pay | Admitting: Orthopedic Surgery

## 2012-01-26 NOTE — Telephone Encounter (Signed)
Patient called to relay that he has had a flare-up with his low back, no injury; states he is not resting at night.  Asking about scheduling an appointment here and possibly having repeat epidural steroid injection. He was last seen 11/12/10 and had ESI on 04/29/11 at [facility].  His cel # is [phone].   Please advise.

## 2012-02-18 ENCOUNTER — Encounter (HOSPITAL_COMMUNITY)
Admission: AD | Disposition: A | Discharge status: Home Health | Payer: Self-pay | Source: Ambulatory Visit | Attending: Surgery

## 2012-02-18 ENCOUNTER — Ambulatory Visit (INDEPENDENT_AMBULATORY_CARE_PROVIDER_SITE_OTHER): Payer: Medicare HMO | Admitting: Surgery

## 2012-02-18 ENCOUNTER — Other Ambulatory Visit: Payer: Self-pay

## 2012-02-18 ENCOUNTER — Ambulatory Visit (HOSPITAL_COMMUNITY): Payer: Managed Care, Other (non HMO) | Admitting: Anesthesiology

## 2012-02-18 ENCOUNTER — Encounter (HOSPITAL_COMMUNITY): Payer: Self-pay | Admitting: *Deleted

## 2012-02-18 ENCOUNTER — Encounter (HOSPITAL_COMMUNITY): Payer: Self-pay | Admitting: Anesthesiology

## 2012-02-18 ENCOUNTER — Ambulatory Visit (HOSPITAL_COMMUNITY)
Admission: AD | Admit: 2012-02-18 | Discharge: 2012-02-18 | Disposition: A | Discharge status: Home Health | Payer: Managed Care, Other (non HMO) | Source: Ambulatory Visit | Attending: Surgery | Admitting: Surgery

## 2012-02-18 ENCOUNTER — Encounter (INDEPENDENT_AMBULATORY_CARE_PROVIDER_SITE_OTHER): Payer: Self-pay | Admitting: Surgery

## 2012-02-18 VITALS — BP 132/90 | HR 72 | Temp 97.9°F | Resp 18 | Ht 72.0 in | Wt 255.4 lb

## 2012-02-18 DIAGNOSIS — R609 Edema, unspecified: Secondary | ICD-10-CM | POA: Insufficient documentation

## 2012-02-18 DIAGNOSIS — Z01810 Encounter for preprocedural cardiovascular examination: Secondary | ICD-10-CM | POA: Insufficient documentation

## 2012-02-18 DIAGNOSIS — K612 Anorectal abscess: Secondary | ICD-10-CM | POA: Insufficient documentation

## 2012-02-18 DIAGNOSIS — I10 Essential (primary) hypertension: Secondary | ICD-10-CM | POA: Insufficient documentation

## 2012-02-18 DIAGNOSIS — Z79899 Other long term (current) drug therapy: Secondary | ICD-10-CM | POA: Insufficient documentation

## 2012-02-18 DIAGNOSIS — K6289 Other specified diseases of anus and rectum: Secondary | ICD-10-CM | POA: Insufficient documentation

## 2012-02-18 DIAGNOSIS — K611 Rectal abscess: Secondary | ICD-10-CM | POA: Insufficient documentation

## 2012-02-18 HISTORY — PX: IRRIGATION AND DEBRIDEMENT ABSCESS: SHX5252

## 2012-02-18 LAB — BASIC METABOLIC PANEL
BUN: 14 mg/dL (ref 6–23)
Creatinine, Ser: 1.06 mg/dL (ref 0.50–1.35)
GFR calc Af Amer: >90 mL/min (ref >90)
GFR calc non Af Amer: 79 mL/min — ABNORMAL LOW (ref >90)
Glucose, Bld: 89 mg/dL (ref 70–99)
Potassium: 4.1 mEq/L (ref 3.5–5.1)

## 2012-02-18 LAB — DIFFERENTIAL
Basophils Absolute: 0 10*3/uL (ref 0.0–0.1)
Basophils Relative: 0 % (ref 0–1)
Eosinophils Absolute: 0.3 10*3/uL (ref 0.0–0.7)
Monocytes Absolute: 1 10*3/uL (ref 0.1–1.0)
Monocytes Relative: 10 % (ref 3–12)
Neutrophils Relative %: 66 % (ref 43–77)

## 2012-02-18 LAB — CBC
Hemoglobin: 14.5 g/dL (ref 13.0–17.0)
MCH: 31 pg (ref 26.0–34.0)
MCHC: 35.1 g/dL (ref 30.0–36.0)
RDW: 13.1 % (ref 11.5–15.5)

## 2012-02-18 SURGERY — INCISION AND DRAINAGE, ABSCESS, PERIRECTAL
Anesthesia: General | Site: Anus | Wound class: Dirty or Infected

## 2012-02-18 MED ORDER — BUPIVACAINE LIPOSOME 1.3 % IJ SUSP
INTRAMUSCULAR | Status: DC | PRN
  Administered 2012-02-18: 20 mL

## 2012-02-18 MED ORDER — BUPIVACAINE LIPOSOME 1.3 % IJ SUSP
20.0000 mL | Freq: Once | INTRAMUSCULAR | Status: DC
  Filled 2012-02-18: qty 20

## 2012-02-18 MED ORDER — ACETAMINOPHEN 10 MG/ML IV SOLN
INTRAVENOUS | Status: DC | PRN
  Administered 2012-02-18: 1000 mg via INTRAVENOUS

## 2012-02-18 MED ORDER — FENTANYL CITRATE 0.05 MG/ML IJ SOLN: 25.0000 ug | INTRAMUSCULAR | Status: DC | PRN

## 2012-02-18 MED ORDER — OXYCODONE HCL 5 MG PO TABS
5.0000 mg | ORAL_TABLET | ORAL | Status: DC | PRN
Start: 2012-02-18 — End: 2012-02-19
  Administered 2012-02-18: 5 mg via ORAL

## 2012-02-18 MED ORDER — PROPOFOL 10 MG/ML IV EMUL
INTRAVENOUS | Status: DC | PRN
  Administered 2012-02-18: 200 mg via INTRAVENOUS

## 2012-02-18 MED ORDER — LACTATED RINGERS IV SOLN
INTRAVENOUS | Status: DC | PRN
  Administered 2012-02-18: 19:00:00 via INTRAVENOUS

## 2012-02-18 MED ORDER — LIDOCAINE HCL 1 % IJ SOLN
INTRAMUSCULAR | Status: DC | PRN
  Administered 2012-02-18: 100 mg via INTRADERMAL

## 2012-02-18 MED ORDER — METOCLOPRAMIDE HCL 5 MG/ML IJ SOLN
INTRAMUSCULAR | Status: DC | PRN
  Administered 2012-02-18: 5 mg via INTRAVENOUS

## 2012-02-18 MED ORDER — MIDAZOLAM HCL 5 MG/5ML IJ SOLN
INTRAMUSCULAR | Status: DC | PRN
  Administered 2012-02-18: 1 mg via INTRAVENOUS

## 2012-02-18 MED ORDER — BUPIVACAINE HCL (PF) 0.5 % IJ SOLN
INTRAMUSCULAR | Status: AC
  Filled 2012-02-18: qty 30

## 2012-02-18 MED ORDER — ACETAMINOPHEN 10 MG/ML IV SOLN
INTRAVENOUS | Status: AC
  Filled 2012-02-18: qty 100

## 2012-02-18 MED ORDER — ERTAPENEM SODIUM 1 G IJ SOLR: 1.0000 g | INTRAMUSCULAR | Status: DC

## 2012-02-18 MED ORDER — FENTANYL CITRATE 0.05 MG/ML IJ SOLN
INTRAMUSCULAR | Status: DC | PRN
  Administered 2012-02-18 (×2): 50 ug via INTRAVENOUS

## 2012-02-18 MED ORDER — PROMETHAZINE HCL 25 MG/ML IJ SOLN: 6.2500 mg | INTRAMUSCULAR | Status: DC | PRN

## 2012-02-18 MED ORDER — HYDROCODONE-ACETAMINOPHEN 7.5-325 MG PO TABS: 1.0000 | ORAL_TABLET | ORAL | Status: AC | PRN

## 2012-02-18 MED ORDER — DEXAMETHASONE SODIUM PHOSPHATE 10 MG/ML IJ SOLN
INTRAMUSCULAR | Status: DC | PRN
  Administered 2012-02-18: 10 mg via INTRAVENOUS

## 2012-02-18 MED ORDER — ONDANSETRON HCL 4 MG/2ML IJ SOLN
INTRAMUSCULAR | Status: DC | PRN
  Administered 2012-02-18 (×2): 2 mg via INTRAVENOUS

## 2012-02-18 MED ORDER — OXYCODONE HCL 5 MG PO TABS
ORAL_TABLET | ORAL | Status: AC
  Filled 2012-02-18: qty 1

## 2012-02-18 MED ORDER — LACTATED RINGERS IV SOLN
INTRAVENOUS | Status: DC
  Administered 2012-02-18: 20:00:00 via INTRAVENOUS

## 2012-02-18 SURGICAL SUPPLY — 34 items
BLADE HEX COATED 2.75 (ELECTRODE) ×2 IMPLANT
BLADE SURG 15 STRL LF DISP TIS (BLADE) ×1 IMPLANT
BLADE SURG 15 STRL SS (BLADE) ×2
CANISTER SUCTION 2500CC (MISCELLANEOUS) ×2 IMPLANT
CLOTH BEACON ORANGE TIMEOUT ST (SAFETY) ×2 IMPLANT
DRESSING ADAPTIC 1/2  N-ADH (PACKING) ×1 IMPLANT
DRSG PAD ABDOMINAL 8X10 ST (GAUZE/BANDAGES/DRESSINGS) ×1 IMPLANT
ELECT REM PT RETURN 9FT ADLT (ELECTROSURGICAL) ×2
ELECTRODE REM PT RTRN 9FT ADLT (ELECTROSURGICAL) ×1 IMPLANT
GAUZE PACKING 1/2 X5 YD (GAUZE/BANDAGES/DRESSINGS) ×1 IMPLANT
GAUZE PACKING IODOFORM 1/4X5 (PACKING) IMPLANT
GAUZE SPONGE 4X4 12PLY STRL LF (GAUZE/BANDAGES/DRESSINGS) ×1 IMPLANT
GAUZE SPONGE 4X4 16PLY XRAY LF (GAUZE/BANDAGES/DRESSINGS) ×2 IMPLANT
GLOVE BIO SURGEON STRL SZ7 (GLOVE) ×4 IMPLANT
GLOVE BIOGEL PI IND STRL 7.0 (GLOVE) ×1 IMPLANT
GLOVE BIOGEL PI INDICATOR 7.0 (GLOVE) ×1
GOWN STRL NON-REIN LRG LVL3 (GOWN DISPOSABLE) ×2 IMPLANT
GOWN STRL REIN XL XLG (GOWN DISPOSABLE) ×4 IMPLANT
KIT BASIN OR (CUSTOM PROCEDURE TRAY) ×2 IMPLANT
LUBRICANT JELLY K Y 4OZ (MISCELLANEOUS) ×2 IMPLANT
NDL HYPO 25X1 1.5 SAFETY (NEEDLE) IMPLANT
NEEDLE HYPO 25X1 1.5 SAFETY (NEEDLE) IMPLANT
PACK LITHOTOMY IV (CUSTOM PROCEDURE TRAY) ×2 IMPLANT
PAD ABD 7.5X8 STRL (GAUZE/BANDAGES/DRESSINGS) IMPLANT
PENCIL BUTTON HOLSTER BLD 10FT (ELECTRODE) ×2 IMPLANT
SOL PREP PROV IODINE SCRUB 4OZ (MISCELLANEOUS) ×2 IMPLANT
SPONGE GAUZE 4X4 12PLY (GAUZE/BANDAGES/DRESSINGS) ×2 IMPLANT
SUT CHROMIC 2 0 SH (SUTURE) IMPLANT
SUT CHROMIC 3 0 SH 27 (SUTURE) IMPLANT
SWAB COLLECTION DEVICE MRSA (MISCELLANEOUS) IMPLANT
SYR CONTROL 10ML LL (SYRINGE) IMPLANT
TOWEL OR 17X26 10 PK STRL BLUE (TOWEL DISPOSABLE) ×2 IMPLANT
UNDERPAD 30X30 INCONTINENT (UNDERPADS AND DIAPERS) IMPLANT
YANKAUER SUCT BULB TIP 10FT TU (MISCELLANEOUS) ×2 IMPLANT

## 2012-02-18 NOTE — H&P (View-Only) (Signed)
Chief Complaint  Patient presents with  . Rectal Problems    perirectal abscess - referral from [facility]    HISTORY: Patient is a 51-year-old white male referred from urgent care with a 5 day history of perirectal abscess. Patient first developed pain 5 days ago. This has gradually increased. He is noted swelling in the right buttock. He has noted pain with sitting and with ambulation.  He denies any drainage. He denies any bleeding per rectum. He denies any prior anorectal abscesses.  Patient is now referred for surgical management of perirectal abscess.  Past Medical History  Diagnosis Date  . Hypertension   . Levator syndrome 2001    history   . Perirectal abscess      Current Outpatient Prescriptions  Medication Sig Dispense Refill  . cephALEXin (KEFLEX) 500 MG capsule Three times a day.      . HYDROcodone-acetaminophen (NORCO) 7.5-325 MG per tablet as needed.      . ABILIFY 5 MG tablet       . albuterol (PROVENTIL) (2.5 MG/3ML) 0.083% nebulizer solution       . amitriptyline (ELAVIL) 25 MG tablet       . amoxicillin-clavulanate (AUGMENTIN) 875-125 MG per tablet       . amphetamine-dextroamphetamine (ADDERALL) 10 MG tablet       . cefUROXime (CEFTIN) 500 MG tablet       . citalopram (CELEXA) 40 MG tablet       . fluticasone (FLONASE) 50 MCG/ACT nasal spray       . lamoTRIgine (LAMICTAL) 200 MG tablet       . losartan-hydrochlorothiazide (HYZAAR) 100-25 MG per tablet       . methocarbamol (ROBAXIN) 500 MG tablet       . methylPREDNIsolone (MEDROL DOSPACK) 4 MG tablet       . naproxen sodium (ANAPROX) 550 MG tablet       . QUEtiapine (SEROQUEL) 25 MG tablet       . STRATTERA 60 MG capsule       . traMADol (ULTRAM) 50 MG tablet          No Known Allergies   History reviewed. No pertinent family history.   History   Social History  . Marital Status: Widowed    Spouse Name: N/A    Number of Children: N/A  . Years of Education: N/A   Social History  Main Topics  . Smoking status: Never Smoker   . Smokeless tobacco: Never Used  . Alcohol Use: 0.5 oz/week    1 drink(s) per week  . Drug Use: No  . Sexually Active: None   Other Topics Concern  . None   Social History Narrative  . None     REVIEW OF SYSTEMS - PERTINENT POSITIVES ONLY: Moderate to severe pain, worse with ambulation and sitting. No drainage. Denies fevers or chills. Normal bowel movement yesterday.  EXAM: Filed Vitals:   02/18/12 1600  BP: 132/90  Pulse: 72  Temp: 97.9 F (36.6 C)  Resp: 18    HEENT: normocephalic; pupils equal and reactive; sclerae clear; dentition good; mucous membranes moist NECK:  symmetric on extension; no palpable anterior or posterior cervical lymphadenopathy; no supraclavicular masses; no tenderness CHEST: clear to auscultation bilaterally without rales, rhonchi, or wheezes CARDIAC: regular rate and rhythm without significant murmur; peripheral pulses are full ABDOMEN: soft without distension; bowel sounds present; no mass; no hepatosplenomegaly; no hernia RECTAL: Area of induration and superficial ulceration right medial buttock. Small   purulent drainage. Exquisite tenderness. EXT:  non-tender without edema; no deformity NEURO: no gross focal deficits; no sign of tremor   LABORATORY RESULTS: See [facility] (CHL-Epic) for most recent results   RADIOLOGY RESULTS: See [facility] (CHL-Epic) for most recent results  Procedure: Under aseptic conditions, local anesthetic is infiltrated into the area around the ulceration and drainage. This is poorly tolerated by the patient. After established in an area of anesthesia, the wound is probed with a Q-tip and a hemostat. Again the patient experienced exquisite tenderness and is unable to tolerate the procedure here in the office without anesthesia. Procedure is discontinued at this time and a dressing was placed.  IMPRESSION: Right perirectal abscess  PLAN: An attempt was made  to perform incision and drainage here in the office. This was poorly tolerated by the patient and he experienced significant discomfort. The procedure was stopped and the patient will be scheduled for surgery this evening at the hospital with general anesthesia. I have discussed the case with my partner who is on call at the hospital and we'll perform the procedure.  The risks and benefits of the procedure have been discussed at length with the patient.  The patient understands the proposed procedure, potential alternative treatments, and the course of recovery to be expected.  All of the patient's questions have been answered at this time.  The patient wishes to proceed with surgery and will schedule a date for the procedure through our office staff.   Shiron Whetsel M. Kirstin Kugler, MD, FACS General & Endocrine Surgery [facility]   Visit Diagnoses: 1. Perirectal abscess     Primary Care Physician: HARRIS, WILLIAM, MD, MD  [facility]:  Dr. Samuel Kelly  

## 2012-02-18 NOTE — Preoperative (Signed)
Beta Blockers   Reason not to administer Beta Blockers:Not Applicable 

## 2012-02-18 NOTE — Op Note (Signed)
02/18/2012  7:37 PM  PATIENT:  Gary Grimes  51 y.o. male  PRE-OPERATIVE DIAGNOSIS:  perirectal abscess  POST-OPERATIVE DIAGNOSIS:  * No post-op diagnosis entered *  PROCEDURE:  Procedure(s) (LRB): IRRIGATION AND DEBRIDEMENT PERIRECTAL ABSCESS (N/A)  SURGEON:  Surgeon(s) and Role:    * Shelly Rubenstein, MD - Primary  PHYSICIAN ASSISTANT:   ASSISTANTS: none   ANESTHESIA:   general  EBL:     BLOOD ADMINISTERED:none  DRAINS: none   LOCAL MEDICATIONS USED:  BUPIVICAINE   SPECIMEN:  No Specimen  DISPOSITION OF SPECIMEN:  N/A  COUNTS:  YES  TOURNIQUET:  * No tourniquets in log *  DICTATION: Reubin Milan Dictation Indications: This is a 51 year old gentleman that presented to our urgent office with a perirectal abscess. An attempt was made to drain this in the office. Because of his discomfort, this was abandoned and plans were made to drain the abscess in the operating room.  Findings: All of the purulence or drainage from the abscess cavity. It did not appear to extend into the sphincter mechanism. It appeared superficial.  Procedure: The patient was brought to the operating room and identified as the correct patient. He was placed supine on the operating room table and general anesthesia was induced. The patient was then placed in the lithotomy position. His perianal area was then prepped and draped in the usual sterile fashion. The patient our had an open incision at the 7:00 position. I made this incision larger with the cautery. I then probed deeply with a hemostat in all directions. There was a moderate sized abscess cavity but no purulence remaining. It did not appear to traverse the sphincter mechanisms. There was a question of a small opening in the anal canal but it was difficult to tell. I irrigated with saline. I then anesthetized the wound with bupivacaine. I then packed the wound with a small piece of Gelfoam and half-inch packing gauze. Dry gauze and mesh underwear  were then placed over the wound. The patient tolerated the procedure well. All the counts were correct at the end of the procedure. The patient was then extubated in the operating room and taken in stable condition to the recovery room. PLAN OF CARE: Discharge to home after PACU  PATIENT DISPOSITION:  PACU - hemodynamically stable.   Delay start of Pharmacological VTE agent (>24hrs) due to surgical blood loss or risk of bleeding: not applicable

## 2012-02-18 NOTE — Anesthesia Preprocedure Evaluation (Addendum)
Anesthesia Evaluation  Patient identified by MRN, date of birth, ID band Patient awake    Reviewed: Allergy & Precautions, H&P , NPO status , Patient's Chart, lab work & pertinent test results, reviewed documented beta blocker date and time   Airway Mallampati: II TM Distance: >3 FB Neck ROM: full    Dental No notable dental hx. (+) Teeth Intact and Dental Advisory Given   Pulmonary sleep apnea ,  clear to auscultation  Pulmonary exam normal       Cardiovascular Exercise Tolerance: Good hypertension, Pt. on medications regular Normal    Neuro/Psych Negative Neurological ROS  Negative Psych ROS   GI/Hepatic negative GI ROS, Neg liver ROS,   Endo/Other  Negative Endocrine ROS  Renal/GU negative Renal ROS  Genitourinary negative   Musculoskeletal   Abdominal   Peds  Hematology negative hematology ROS (+)   Anesthesia Other Findings   Reproductive/Obstetrics negative OB ROS                          Anesthesia Physical Anesthesia Plan  ASA: III  Anesthesia Plan: General   Post-op Pain Management:    Induction:   Airway Management Planned: LMA  Additional Equipment:   Intra-op Plan:   Post-operative Plan:   Informed Consent: I have reviewed the patients History and Physical, chart, labs and discussed the procedure including the risks, benefits and alternatives for the proposed anesthesia with the patient or authorized representative who has indicated his/her understanding and acceptance.   Dental Advisory Given  Plan Discussed with: CRNA and Surgeon  Anesthesia Plan Comments:        Anesthesia Quick Evaluation

## 2012-02-18 NOTE — Patient Instructions (Signed)
For incision and drainage in OR at [facility].  tmg

## 2012-02-18 NOTE — Discharge Instructions (Signed)
[facility] _______[facility] ° °RECTAL SURGERY POST OP INSTRUCTIONS: POST OP INSTRUCTIONS ° °Always review your discharge instruction sheet given to you by the facility where your surgery was performed. °IF YOU HAVE DISABILITY OR FAMILY LEAVE FORMS, YOU MUST BRING THEM TO THE OFFICE FOR PROCESSING.   °DO NOT GIVE THEM TO YOUR DOCTOR. ° °1. A  prescription for pain medication may be given to you upon discharge.  Take your pain medication as prescribed, if needed.  If narcotic pain medicine is not needed, then you may take acetaminophen (Tylenol) or ibuprofen (Advil) as needed. °2. Take your usually prescribed medications unless otherwise directed. °3. If you need a refill on your pain medication, please contact your pharmacy.  They will contact our office to request authorization. Prescriptions will not be filled after 5 pm or on week-ends. °4. You should follow a light diet the first 48 hours after arrival home, such as soup and crackers, etc.  Be sure to include lots of fluids daily.  Resume your normal diet 2-3 days after surgery.. °5. Most patients will experience some swelling and discomfort in the rectal area. Ice packs, reclining and warm tub soaks will help.  Swelling and discomfort can take several days to resolve.  °6. It is common to experience some constipation if taking pain medication after surgery.  Increasing fluid intake and taking a stool softener (such as Colace) will usually help or prevent this problem from occurring.  A mild laxative (Milk of Magnesia or Miralax) should be taken according to package directions if there are no bowel movements after 48 hours. °7. Unless discharge instructions indicate otherwise, leave your bandage dry and in place for 24 hours, or remove the bandage if you have a bowel movement. You may notice a small amount of bleeding with bowel movements for the first few days. You may have some packing in the rectum which will come out over the first day or two. You  will need to wear an absorbent pad or soft cotton gauze in your underwear until the drainage stops.it. °8. ACTIVITIES:  You may resume regular (light) daily activities beginning the next day--such as daily self-care, walking, climbing stairs--gradually increasing activities as tolerated.  You may have sexual intercourse when it is comfortable.  Refrain from any heavy lifting or straining until approved by your doctor. °a. You may drive when you are no longer taking prescription pain medication, you can comfortably wear a seatbelt, and you can safely maneuver your car and apply brakes. °b. RETURN TO WORK: : ____________________ °c.  °9. You should see your doctor in the office for a follow-up appointment approximately 2-3 weeks after your surgery.  Make sure that you call for this appointment within a day or two after you arrive home to insure a convenient appointment time. °10. OTHER INSTRUCTIONS:  __________________________________________________________________________________________________________________________________________________________________________________________  °WHEN TO CALL YOUR DOCTOR: °1. Fever over 101.0 °2. Inability to urinate °3. Nausea and/or vomiting °4. Extreme swelling or bruising °5. Continued bleeding from rectum. °6. Increased pain, redness, or drainage from the incision °7. Constipation ° °The clinic staff is available to answer your questions during regular business hours.  Please don’t hesitate to call and ask to speak to one of the nurses for clinical concerns.  If you have a medical emergency, go to the nearest emergency room or call 911.  A surgeon from [facility] is always on call at the hospital ° ° °[address], [address], [place], [place]  [address] ? °   [address] [address], [place], [place]   [address] °[phone] ? [phone] ? FAX [phone] °Web site: [email or web address] ° °

## 2012-02-18 NOTE — Progress Notes (Signed)
Chief Complaint  Patient presents with  . Rectal Problems    perirectal abscess - referral from [facility]    HISTORY: Patient is a 51 year old white male referred from urgent care with a 5 day history of perirectal abscess. Patient first developed pain 5 days ago. This has gradually increased. He is noted swelling in the right buttock. He has noted pain with sitting and with ambulation.  He denies any drainage. He denies any bleeding per rectum. He denies any prior anorectal abscesses.  Patient is now referred for surgical management of perirectal abscess.  Past Medical History  Diagnosis Date  . Hypertension   . Levator syndrome 2001    history   . Perirectal abscess      Current Outpatient Prescriptions  Medication Sig Dispense Refill  . cephALEXin (KEFLEX) 500 MG capsule Three times a day.      Marland Kitchen HYDROcodone-acetaminophen (NORCO) 7.5-325 MG per tablet as needed.      . ABILIFY 5 MG tablet       . albuterol (PROVENTIL) (2.5 MG/3ML) 0.083% nebulizer solution       . amitriptyline (ELAVIL) 25 MG tablet       . amoxicillin-clavulanate (AUGMENTIN) 875-125 MG per tablet       . amphetamine-dextroamphetamine (ADDERALL) 10 MG tablet       . cefUROXime (CEFTIN) 500 MG tablet       . citalopram (CELEXA) 40 MG tablet       . fluticasone (FLONASE) 50 MCG/ACT nasal spray       . lamoTRIgine (LAMICTAL) 200 MG tablet       . losartan-hydrochlorothiazide (HYZAAR) 100-25 MG per tablet       . methocarbamol (ROBAXIN) 500 MG tablet       . methylPREDNIsolone (MEDROL DOSPACK) 4 MG tablet       . naproxen sodium (ANAPROX) 550 MG tablet       . QUEtiapine (SEROQUEL) 25 MG tablet       . STRATTERA 60 MG capsule       . traMADol (ULTRAM) 50 MG tablet          No Known Allergies   History reviewed. No pertinent family history.   History   Social History  . Marital Status: Widowed    Spouse Name: N/A    Number of Children: N/A  . Years of Education: N/A   Social History  Main Topics  . Smoking status: Never Smoker   . Smokeless tobacco: Never Used  . Alcohol Use: 0.5 oz/week    1 drink(s) per week  . Drug Use: No  . Sexually Active: None   Other Topics Concern  . None   Social History Narrative  . None     REVIEW OF SYSTEMS - PERTINENT POSITIVES ONLY: Moderate to severe pain, worse with ambulation and sitting. No drainage. Denies fevers or chills. Normal bowel movement yesterday.  EXAM: Filed Vitals:   02/18/12 1600  BP: 132/90  Pulse: 72  Temp: 97.9 F (36.6 C)  Resp: 18    HEENT: normocephalic; pupils equal and reactive; sclerae clear; dentition good; mucous membranes moist NECK:  symmetric on extension; no palpable anterior or posterior cervical lymphadenopathy; no supraclavicular masses; no tenderness CHEST: clear to auscultation bilaterally without rales, rhonchi, or wheezes CARDIAC: regular rate and rhythm without significant murmur; peripheral pulses are full ABDOMEN: soft without distension; bowel sounds present; no mass; no hepatosplenomegaly; no hernia RECTAL: Area of induration and superficial ulceration right medial buttock. Small  purulent drainage. Exquisite tenderness. EXT:  non-tender without edema; no deformity NEURO: no gross focal deficits; no sign of tremor   LABORATORY RESULTS: See [facility] (CHL-Epic) for most recent results   RADIOLOGY RESULTS: See [facility] (CHL-Epic) for most recent results  Procedure: Under aseptic conditions, local anesthetic is infiltrated into the area around the ulceration and drainage. This is poorly tolerated by the patient. After established in an area of anesthesia, the wound is probed with a Q-tip and a hemostat. Again the patient experienced exquisite tenderness and is unable to tolerate the procedure here in the office without anesthesia. Procedure is discontinued at this time and a dressing was placed.  IMPRESSION: Right perirectal abscess  PLAN: An attempt was made  to perform incision and drainage here in the office. This was poorly tolerated by the patient and he experienced significant discomfort. The procedure was stopped and the patient will be scheduled for surgery this evening at the hospital with general anesthesia. I have discussed the case with my partner who is on call at the hospital and we'll perform the procedure.  The risks and benefits of the procedure have been discussed at length with the patient.  The patient understands the proposed procedure, potential alternative treatments, and the course of recovery to be expected.  All of the patient's questions have been answered at this time.  The patient wishes to proceed with surgery and will schedule a date for the procedure through our office staff.   Velora Heckler, MD, FACS General & Endocrine Surgery [facility]   Visit Diagnoses: 1. Perirectal abscess     Primary Care Physician: Johny Blamer, MD, MD  [facility]:  Dr. Jenita Seashore

## 2012-02-18 NOTE — Interval H&P Note (Signed)
History and Physical Interval Note:  He has had no change in his history or exam.  I have discussed this with Dr. Gerrit Friends  02/18/2012 5:56 PM  Gary Grimes  has presented today for surgery, with the diagnosis of perirectal abscess  The various methods of treatment have been discussed with the patient and family. After consideration of risks, benefits and other options for treatment, the patient has consented to  Procedure(s) (LRB): IRRIGATION AND DEBRIDEMENT PERIRECTAL ABSCESS (N/A) as a surgical intervention .  The patients' history has been reviewed, patient examined, no change in status, stable for surgery.  I have reviewed the patients' chart and labs.  Questions were answered to the patient's satisfaction.     [name] A

## 2012-02-18 NOTE — Anesthesia Postprocedure Evaluation (Signed)
  Anesthesia Post-op Note  Patient: [name]  Procedure(s) Performed: Procedure(s) (LRB): IRRIGATION AND DEBRIDEMENT PERIRECTAL ABSCESS (N/A)  Patient Location: PACU  Anesthesia Type: General  Level of Consciousness: awake and alert   Airway and Oxygen Therapy: Patient Spontanous Breathing  Post-op Pain: mild  Post-op Assessment: Post-op Vital signs reviewed, Patient's Cardiovascular Status Stable, Respiratory Function Stable, Patent Airway and No signs of Nausea or vomiting  Post-op Vital Signs: stable  Complications: No apparent anesthesia complications

## 2012-02-18 NOTE — Transfer of Care (Signed)
Immediate Anesthesia Transfer of Care Note  Patient: Gary Grimes  Procedure(s) Performed: Procedure(s) (LRB): IRRIGATION AND DEBRIDEMENT PERIRECTAL ABSCESS (N/A)  Patient Location: PACU  Anesthesia Type: General  Level of Consciousness: awake, alert , oriented and patient cooperative  Airway & Oxygen Therapy: Patient Spontanous Breathing and Patient connected to face mask oxygen  Post-op Assessment: Report given to PACU RN, Post -op Vital signs reviewed and stable and Patient moving all extremities  Post vital signs: Reviewed and stable  Complications: No apparent anesthesia complications

## 2012-02-19 ENCOUNTER — Encounter (INDEPENDENT_AMBULATORY_CARE_PROVIDER_SITE_OTHER): Payer: Self-pay | Admitting: Surgery

## 2012-02-19 MED FILL — Mupirocin Oint 2%: CUTANEOUS | Qty: 22 | Status: AC

## 2012-02-22 ENCOUNTER — Ambulatory Visit (INDEPENDENT_AMBULATORY_CARE_PROVIDER_SITE_OTHER): Payer: Managed Care, Other (non HMO) | Admitting: Surgery

## 2012-02-22 ENCOUNTER — Encounter (INDEPENDENT_AMBULATORY_CARE_PROVIDER_SITE_OTHER): Payer: Self-pay | Admitting: Surgery

## 2012-02-22 VITALS — BP 142/100 | HR 68 | Temp 98.2°F | Resp 16 | Ht 72.0 in | Wt 256.0 lb

## 2012-02-22 DIAGNOSIS — Z09 Encounter for follow-up examination after completed treatment for conditions other than malignant neoplasm: Secondary | ICD-10-CM

## 2012-02-22 NOTE — Progress Notes (Signed)
Subjective:     Patient ID: Gary Grimes, male   DOB: [birth year], 51 y.o.   MRN: [id number]  HPI  He is here for his first postoperative visit status post incision and drainage of a perirectal abscess. He is doing very well and has minimal discomfort. He is finishing his course of antibiotics. He was able to allow for packing with minimal discomfort Review of Systems     Objective:   Physical Exam    On exam, the abscess cavity appears to be healing. There is minimal induration and no purulence Assessment:     Patient status post incision and drainage of perirectal abscess    Plan:     He will finish his course of antibiotics. He will come back and see [name] as needed

## 2012-02-29 ENCOUNTER — Encounter (HOSPITAL_COMMUNITY): Payer: Self-pay | Admitting: Surgery

## 2012-04-12 ENCOUNTER — Emergency Department (HOSPITAL_COMMUNITY)
Admission: EM | Admit: 2012-04-12 | Discharge: 2012-04-14 | Disposition: A | Discharge status: Home / Self Care | Payer: Managed Care, Other (non HMO) | Attending: Emergency Medicine | Admitting: Emergency Medicine

## 2012-04-12 ENCOUNTER — Emergency Department (HOSPITAL_COMMUNITY): Payer: Managed Care, Other (non HMO)

## 2012-04-12 ENCOUNTER — Encounter (HOSPITAL_COMMUNITY): Payer: Self-pay

## 2012-04-12 DIAGNOSIS — M25519 Pain in unspecified shoulder: Secondary | ICD-10-CM | POA: Insufficient documentation

## 2012-04-12 DIAGNOSIS — F4322 Adjustment disorder with anxiety: Secondary | ICD-10-CM | POA: Insufficient documentation

## 2012-04-12 DIAGNOSIS — M25559 Pain in unspecified hip: Secondary | ICD-10-CM | POA: Insufficient documentation

## 2012-04-12 DIAGNOSIS — M25579 Pain in unspecified ankle and joints of unspecified foot: Secondary | ICD-10-CM | POA: Insufficient documentation

## 2012-04-12 DIAGNOSIS — I10 Essential (primary) hypertension: Secondary | ICD-10-CM | POA: Insufficient documentation

## 2012-04-12 DIAGNOSIS — R079 Chest pain, unspecified: Secondary | ICD-10-CM | POA: Insufficient documentation

## 2012-04-12 DIAGNOSIS — R000 Tachycardia, unspecified: Secondary | ICD-10-CM | POA: Insufficient documentation

## 2012-04-12 DIAGNOSIS — Z79899 Other long term (current) drug therapy: Secondary | ICD-10-CM | POA: Insufficient documentation

## 2012-04-12 DIAGNOSIS — IMO0002 Reserved for concepts with insufficient information to code with codable children: Secondary | ICD-10-CM | POA: Insufficient documentation

## 2012-04-12 HISTORY — DX: Pure hyperglyceridemia: E78.1

## 2012-04-12 HISTORY — DX: Pure hypercholesterolemia, unspecified: E78.00

## 2012-04-12 LAB — DIFFERENTIAL
Basophils Relative: 0 % (ref 0–1)
Eosinophils Absolute: 0.1 10*3/uL (ref 0.0–0.7)
Eosinophils Relative: 1 % (ref 0–5)
Lymphs Abs: 1.6 10*3/uL (ref 0.7–4.0)
Monocytes Relative: 10 % (ref 3–12)
Neutrophils Relative %: 72 % (ref 43–77)

## 2012-04-12 LAB — BASIC METABOLIC PANEL
BUN: 21 mg/dL (ref 6–23)
CO2: 26 mEq/L (ref 19–32)
Glucose, Bld: 144 mg/dL — ABNORMAL HIGH (ref 70–99)
Potassium: 3.8 mEq/L (ref 3.5–5.1)
Sodium: 141 mEq/L (ref 135–145)

## 2012-04-12 LAB — CBC
MCH: 31 pg (ref 26.0–34.0)
MCHC: 35.5 g/dL (ref 30.0–36.0)
MCV: 87.3 fL (ref 78.0–100.0)
Platelets: 302 10*3/uL (ref 150–400)
RBC: 4.87 MIL/uL (ref 4.22–5.81)

## 2012-04-12 LAB — CARDIAC PANEL(CRET KIN+CKTOT+MB+TROPI): Total CK: 1475 U/L — ABNORMAL HIGH (ref 7–232)

## 2012-04-12 MED ORDER — KETOROLAC TROMETHAMINE 30 MG/ML IJ SOLN
30.0000 mg | Freq: Once | INTRAMUSCULAR | Status: AC
  Administered 2012-04-12: 30 mg via INTRAVENOUS
  Filled 2012-04-12: qty 1

## 2012-04-12 MED ORDER — MORPHINE SULFATE 4 MG/ML IJ SOLN
4.0000 mg | Freq: Once | INTRAMUSCULAR | Status: AC
  Administered 2012-04-12: 4 mg via INTRAVENOUS
  Filled 2012-04-12: qty 1

## 2012-04-12 MED ORDER — SODIUM CHLORIDE 0.9 % IV BOLUS (SEPSIS)
1000.0000 mL | Freq: Once | INTRAVENOUS | Status: AC
  Administered 2012-04-12: 1000 mL via INTRAVENOUS

## 2012-04-12 NOTE — ED Notes (Signed)
Patient refused to get in a gown, or allow me to do an ekg.  Dr. Adriana Simas notifed.  Patient states he wants his son called and wants an attorney.

## 2012-04-12 NOTE — ED Notes (Signed)
Son's phone number if staff need to speak with him. Adam [phone]. Son does not want pt calling him or knowing that he has spoken with staff

## 2012-04-12 NOTE — ED Notes (Signed)
Telepsych complete and waiting fax for recommendation.
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
## 2012-04-12 NOTE — ED Notes (Signed)
Telepsych called Dr. Preston Fleeting and are ready to evaluate the pt.

## 2012-04-12 NOTE — ED Notes (Signed)
MD at bedside. 
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
## 2012-04-12 NOTE — ED Notes (Signed)
Nurse explained to patient the need for a urine specimen for a urinalysis and urine drug screen.  Verbalized understanding.

## 2012-04-12 NOTE — ED Notes (Signed)
Patient stated need additional pain medication. EDP notified.  Nurse entered room with pain medication patient and attorny verbalized understanding. Patient stated wait for pain medication until finishing speaking with his attorney who just arrived to ED.

## 2012-04-12 NOTE — ED Notes (Signed)
Telepsych report received and handed to Dr. Preston Fleeting.

## 2012-04-12 NOTE — ED Notes (Signed)
Patient states pain now 7/10 chest pain

## 2012-04-12 NOTE — ED Notes (Signed)
Patient states ready for pain medication.

## 2012-04-12 NOTE — ED Notes (Signed)
Telepsych called to follow up on delay of report, was told that the Dr. Was faxing over in a few minutes.

## 2012-04-12 NOTE — ED Notes (Signed)
Social work to  [employer]

## 2012-04-12 NOTE — ED Notes (Signed)
Pt is on the phone talking to the law firm where his son works and is not answering any questions by this Char[employer] fundraiser. Dr. Adriana Simas made aware.
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
## 2012-04-12 NOTE — ED Notes (Signed)
House coverage made aware of plans for IVC

## 2012-04-12 NOTE — ED Notes (Signed)
Telepsych called and paperwork faxed. 

## 2012-04-13 LAB — RAPID URINE DRUG SCREEN, HOSP PERFORMED
Amphetamines: NOT DETECTED
Benzodiazepines: NOT DETECTED
Cocaine: NOT DETECTED
Opiates: POSITIVE — AB
Tetrahydrocannabinol: NOT DETECTED

## 2012-04-13 LAB — URINALYSIS, ROUTINE W REFLEX MICROSCOPIC
Bilirubin Urine: NEGATIVE
Glucose, UA: NEGATIVE mg/dL
Hgb urine dipstick: NEGATIVE
Specific Gravity, Urine: 1.02 (ref 1.005–1.030)

## 2012-04-13 LAB — CARDIAC PANEL(CRET KIN+CKTOT+MB+TROPI)
CK, MB: 8.1 ng/mL (ref 0.3–4.0)
Troponin I: <0.3 ng/mL (ref <0.30)

## 2012-04-13 MED ORDER — SODIUM CHLORIDE 0.9 % IV BOLUS (SEPSIS)
1000.0000 mL | Freq: Once | INTRAVENOUS | Status: AC
  Administered 2012-04-13: 1000 mL via INTRAVENOUS

## 2012-04-13 MED ORDER — LOSARTAN POTASSIUM-HCTZ 100-25 MG PO TABS: 1.0000 | ORAL_TABLET | Freq: Every day | ORAL | Status: DC

## 2012-04-13 MED ORDER — ZOLPIDEM TARTRATE 5 MG PO TABS
5.0000 mg | ORAL_TABLET | Freq: Every evening | ORAL | Status: DC | PRN
  Administered 2012-04-13 (×2): 5 mg via ORAL
  Filled 2012-04-13 (×2): qty 1

## 2012-04-13 MED ORDER — AMITRIPTYLINE HCL 25 MG PO TABS
25.0000 mg | ORAL_TABLET | Freq: Every day | ORAL | Status: DC
  Administered 2012-04-13: 25 mg via ORAL
  Filled 2012-04-13: qty 1

## 2012-04-13 MED ORDER — ALUM & MAG HYDROXIDE-SIMETH 200-200-20 MG/5ML PO SUSP: 30.0000 mL | ORAL | Status: DC | PRN

## 2012-04-13 MED ORDER — IBUPROFEN 200 MG PO TABS
600.0000 mg | ORAL_TABLET | Freq: Three times a day (TID) | ORAL | Status: DC | PRN
  Administered 2012-04-13: 600 mg via ORAL
  Filled 2012-04-13: qty 3

## 2012-04-13 MED ORDER — LORAZEPAM 1 MG PO TABS
1.0000 mg | ORAL_TABLET | Freq: Three times a day (TID) | ORAL | Status: DC | PRN
  Administered 2012-04-13 (×2): 1 mg via ORAL
  Filled 2012-04-13 (×2): qty 1

## 2012-04-13 MED ORDER — AMLODIPINE BESYLATE 5 MG PO TABS
5.0000 mg | ORAL_TABLET | Freq: Every day | ORAL | Status: DC
  Administered 2012-04-13 – 2012-04-14 (×2): 5 mg via ORAL
  Filled 2012-04-13 (×3): qty 1

## 2012-04-13 MED ORDER — ONDANSETRON HCL 8 MG PO TABS: 4.0000 mg | ORAL_TABLET | Freq: Three times a day (TID) | ORAL | Status: DC | PRN

## 2012-04-13 MED ORDER — FLUTICASONE PROPIONATE 50 MCG/ACT NA SUSP
2.0000 | Freq: Every day | NASAL | Status: DC
  Administered 2012-04-13 – 2012-04-14 (×2): 2 via NASAL
  Filled 2012-04-13 (×2): qty 16

## 2012-04-13 MED ORDER — HYDROCHLOROTHIAZIDE 25 MG PO TABS
25.0000 mg | ORAL_TABLET | Freq: Every day | ORAL | Status: DC
  Administered 2012-04-13 – 2012-04-14 (×2): 25 mg via ORAL
  Filled 2012-04-13 (×3): qty 1

## 2012-04-13 MED ORDER — ACETAMINOPHEN 325 MG PO TABS
650.0000 mg | ORAL_TABLET | ORAL | Status: DC | PRN
  Administered 2012-04-13: 650 mg via ORAL
  Filled 2012-04-13: qty 2

## 2012-04-13 MED ORDER — LOSARTAN POTASSIUM 50 MG PO TABS
100.0000 mg | ORAL_TABLET | Freq: Every day | ORAL | Status: DC
  Administered 2012-04-13 – 2012-04-14 (×2): 100 mg via ORAL
  Filled 2012-04-13 (×3): qty 2
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
## 2012-04-13 NOTE — ED Notes (Signed)
Brother called and wanted to leave number. [phone]

## 2012-04-13 NOTE — ED Notes (Signed)
Dinner tray ordered, [employer] nonsharp.
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
## 2012-04-13 NOTE — ED Notes (Signed)
Dinner tray delivered @1600 

## 2012-04-13 NOTE — ED Notes (Signed)
Patient requesting reevaluation by ACT team so that he could go home.  ACT team notified, patient was evaluated today, will be reevaluated tomorrow.  Patient's brother very concerned of patient's behavior in last two weeks.  Patient having behavior of being okay one day, then having behavior that is inappropriate and aggressive in nature the next.  Patient has been calm and cooperative here in department at this time.

## 2012-04-14 LAB — BASIC METABOLIC PANEL
BUN: 13 mg/dL (ref 6–23)
CO2: 27 mEq/L (ref 19–32)
GFR calc non Af Amer: >90 mL/min (ref >90)
Glucose, Bld: 101 mg/dL — ABNORMAL HIGH (ref 70–99)
Potassium: 4.1 mEq/L (ref 3.5–5.1)

## 2012-04-14 LAB — CARDIAC PANEL(CRET KIN+CKTOT+MB+TROPI)
CK, MB: 5.6 ng/mL — ABNORMAL HIGH (ref 0.3–4.0)
Total CK: 762 U/L — ABNORMAL HIGH (ref 7–232)

## 2012-04-14 MED ORDER — CITALOPRAM HYDROBROMIDE 20 MG PO TABS: 20.0000 mg | ORAL_TABLET | Freq: Every day | ORAL | Status: AC

## 2012-04-14 NOTE — ED Notes (Signed)
Pt to 4100 with sitter and security to take a shower.

## 2012-04-14 NOTE — ED Notes (Signed)
Dinner tray ordered, reg nonsharp 
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
## 2012-04-14 NOTE — ED Notes (Signed)
Pt waiting for his ride to arrive.

## 2012-04-14 NOTE — ED Notes (Signed)
Pt has been allowed to make several calls this morning.  Pt has also received multiple calls.  Informed pt that next call needed to be limited to 5 minutes.  Pt upset at this news and requests nurses first and last name.  Pt given nurse's first name and initial.

## 2012-04-14 NOTE — Discharge Instructions (Signed)
Adjustment Disorder  Most changes in life can cause stress. Getting used to changes may take a few months or longer. If feelings of stress, hopelessness, or worry continue, you may have an adjustment disorder. This stress-related mental health problem may affect your feelings, thinking and how you act. It occurs in both sexes and happens at any age.  SYMPTOMS   Some of the following problems may be seen and vary from person to person:   Sadness or depression.   Loss of enjoyment.   Thoughts of suicide.   Fighting.   Avoiding family and friends.   Poor school performance.   Hopelessness, sense of loss.   Trouble sleeping.   Vandalism.   Worry, weight loss or gain.   Crying spells.   Anxiety   Reckless driving.   Skipping school.   Poor work performance.   Nervousness.   Ignoring bills.   Poor attitude.  DIAGNOSIS   Your caregiver will ask what has happened in your life and do a physical exam. They will make a diagnosis of an adjustment disorder when they are sure another problem or medical illness causing your feelings does not exist.  TREATMENT   When problems caused by stress interfere with you daily life or last longer than a few months, you may need counseling for an adjustment disorder. Early treatment may diminish problems and help you to better cope with the stressful events in your life. Sometimes medication is necessary. Individual counseling and or support groups can be very helpful.  PROGNOSIS   Adjustment disorders usually last less than 3 to 6 months. The condition may persist if there is long lasting stress. This could include health problems, relationship problems, or job difficulties where you can not easily escape from what is causing the problem.  PREVENTION   Even the most mentally healthy, highly functioning people can suffer from an adjustment disorder given a significant blow from a life-changing event. There is no way to prevent pain and loss. Most people need help from time  to time. You are not alone.  SEEK MEDICAL CARE IF:   Your feelings or symptoms listed above do not improve or worsen.  Document Released: 08/18/2006 Document Revised: 12/03/2011 Document Reviewed: 11/09/2007  [employer] Patient Information 2012 [employer].

## 2012-04-14 NOTE — ED Provider Notes (Addendum)
Pt brought in for delusional and aggressive behavior.  Pt remains calm.  No distress this am.  Stating that he wants to go home to nursing staff.  Filed Vitals:   04/14/12 0632  BP: 134/87  Pulse: 69  Temp: 98.2 F (36.8 C)  Resp: 17   Car RRR  Lungs CTA Plan for reassessment.  Possible telepsych.  Pt's CK is decreasing as expected    Celene Kras, MD 04/14/12 1102  Pt has been requesting to go home. Tele psych consult was requested.  Pt at this time does not meet criteria to continue the involuntary hold.  Will dc.  Consult by Dr Jacky Kindle. Pt given rx for celexa.  Celene Kras, MD 04/14/12 [id number]

## 2012-04-14 NOTE — ED Notes (Signed)
Pt back from shower.  Sitter at bedside.

## 2012-04-14 NOTE — ED Notes (Signed)
Dinner tray delivered.
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
## 2012-04-14 NOTE — ED Notes (Signed)
Pt inquiring as to whether he will be arrested when he leaves.  Pt states he's not saying he's leaving now, but when he does will he be arrested?  I informed pt I was not arrow of his status with [employer].  I also reminded pt he is currently IVC and cannot leave at this time.  Pt is ready to go home and states he doesn't need to be here, but will be a "law abiding citizen" at this time.  Pt states he has not talked to his attorney, but will need to if he is being arrested.  According to notes on 04/16, pt had an attorney in his room in [facility].

## 2012-07-05 ENCOUNTER — Encounter (HOSPITAL_BASED_OUTPATIENT_CLINIC_OR_DEPARTMENT_OTHER): Payer: Self-pay | Admitting: *Deleted

## 2012-07-05 ENCOUNTER — Other Ambulatory Visit: Payer: Self-pay | Admitting: Orthopedic Surgery

## 2012-07-06 ENCOUNTER — Encounter (HOSPITAL_BASED_OUTPATIENT_CLINIC_OR_DEPARTMENT_OTHER)
Admission: RE | Admit: 2012-07-06 | Discharge: 2012-07-06 | Disposition: A | Discharge status: Home / Self Care | Payer: Managed Care, Other (non HMO) | Source: Ambulatory Visit | Attending: Orthopedic Surgery | Admitting: Orthopedic Surgery

## 2012-07-06 ENCOUNTER — Encounter (HOSPITAL_BASED_OUTPATIENT_CLINIC_OR_DEPARTMENT_OTHER): Payer: Self-pay | Admitting: *Deleted

## 2012-07-06 LAB — BASIC METABOLIC PANEL
BUN: 15 mg/dL (ref 6–23)
Chloride: 99 mEq/L (ref 96–112)
Creatinine, Ser: 1.15 mg/dL (ref 0.50–1.35)
GFR calc Af Amer: 82 mL/min — ABNORMAL LOW (ref >90)
Glucose, Bld: 114 mg/dL — ABNORMAL HIGH (ref 70–99)
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
## 2012-07-07 ENCOUNTER — Encounter (HOSPITAL_BASED_OUTPATIENT_CLINIC_OR_DEPARTMENT_OTHER): Payer: Self-pay | Admitting: *Deleted

## 2012-07-08 ENCOUNTER — Encounter (HOSPITAL_BASED_OUTPATIENT_CLINIC_OR_DEPARTMENT_OTHER): Payer: Self-pay

## 2012-07-08 ENCOUNTER — Encounter (HOSPITAL_BASED_OUTPATIENT_CLINIC_OR_DEPARTMENT_OTHER): Payer: Self-pay | Admitting: Anesthesiology

## 2012-07-08 ENCOUNTER — Ambulatory Visit (HOSPITAL_BASED_OUTPATIENT_CLINIC_OR_DEPARTMENT_OTHER)
Admission: RE | Admit: 2012-07-08 | Discharge: 2012-07-08 | Disposition: A | Discharge status: Home / Self Care | Payer: Managed Care, Other (non HMO) | Source: Ambulatory Visit | Attending: Orthopedic Surgery | Admitting: Orthopedic Surgery

## 2012-07-08 ENCOUNTER — Ambulatory Visit (HOSPITAL_BASED_OUTPATIENT_CLINIC_OR_DEPARTMENT_OTHER): Payer: Managed Care, Other (non HMO) | Admitting: Anesthesiology

## 2012-07-08 ENCOUNTER — Encounter (HOSPITAL_BASED_OUTPATIENT_CLINIC_OR_DEPARTMENT_OTHER)
Admission: RE | Disposition: A | Discharge status: Home / Self Care | Payer: Self-pay | Source: Ambulatory Visit | Attending: Orthopedic Surgery

## 2012-07-08 DIAGNOSIS — Z01812 Encounter for preprocedural laboratory examination: Secondary | ICD-10-CM | POA: Insufficient documentation

## 2012-07-08 DIAGNOSIS — I10 Essential (primary) hypertension: Secondary | ICD-10-CM | POA: Insufficient documentation

## 2012-07-08 DIAGNOSIS — G4730 Sleep apnea, unspecified: Secondary | ICD-10-CM | POA: Insufficient documentation

## 2012-07-08 DIAGNOSIS — M67919 Unspecified disorder of synovium and tendon, unspecified shoulder: Secondary | ICD-10-CM | POA: Insufficient documentation

## 2012-07-08 DIAGNOSIS — M719 Bursopathy, unspecified: Secondary | ICD-10-CM | POA: Insufficient documentation

## 2012-07-08 HISTORY — DX: Major depressive disorder, single episode, unspecified: F32.9

## 2012-07-08 HISTORY — DX: Unspecified osteoarthritis, unspecified site: M19.90

## 2012-07-08 HISTORY — DX: Depression, unspecified: F32.A

## 2012-07-08 HISTORY — DX: Anxiety disorder, unspecified: F41.9

## 2012-07-08 LAB — POCT HEMOGLOBIN-HEMACUE: Hemoglobin: 16.4 g/dL (ref 13.0–17.0)

## 2012-07-08 SURGERY — SHOULDER ARTHROSCOPY WITH SUBACROMIAL DECOMPRESSION
Anesthesia: General | Site: Shoulder | Laterality: Right | Wound class: Clean

## 2012-07-08 MED ORDER — ATROPINE SULFATE 0.4 MG/ML IJ SOLN: 0.4000 mg | Freq: Once | INTRAMUSCULAR | Status: DC | PRN

## 2012-07-08 MED ORDER — DEXAMETHASONE SODIUM PHOSPHATE 4 MG/ML IJ SOLN
INTRAMUSCULAR | Status: DC | PRN
  Administered 2012-07-08: 10 mg via INTRAVENOUS

## 2012-07-08 MED ORDER — SUCCINYLCHOLINE CHLORIDE 20 MG/ML IJ SOLN
INTRAMUSCULAR | Status: DC | PRN
  Administered 2012-07-08: 100 mg via INTRAVENOUS

## 2012-07-08 MED ORDER — EPHEDRINE SULFATE 50 MG/ML IJ SOLN
INTRAMUSCULAR | Status: DC | PRN
  Administered 2012-07-08 (×2): 10 mg via INTRAVENOUS
  Administered 2012-07-08: 5 mg via INTRAVENOUS

## 2012-07-08 MED ORDER — ROPIVACAINE HCL 5 MG/ML IJ SOLN
INTRAMUSCULAR | Status: DC | PRN
  Administered 2012-07-08: 25 mL

## 2012-07-08 MED ORDER — CEFAZOLIN SODIUM-DEXTROSE 2-3 GM-% IV SOLR: 2.0000 g | INTRAVENOUS | Status: DC

## 2012-07-08 MED ORDER — POVIDONE-IODINE 7.5 % EX SOLN: Freq: Once | CUTANEOUS | Status: DC

## 2012-07-08 MED ORDER — PROPOFOL 10 MG/ML IV EMUL
INTRAVENOUS | Status: DC | PRN
  Administered 2012-07-08: 250 mg via INTRAVENOUS

## 2012-07-08 MED ORDER — EPINEPHRINE HCL 1 MG/ML IJ SOLN
INTRAMUSCULAR | Status: DC | PRN
  Administered 2012-07-08: 3 mg

## 2012-07-08 MED ORDER — GLYCOPYRROLATE 0.2 MG/ML IJ SOLN
INTRAMUSCULAR | Status: DC | PRN
  Administered 2012-07-08: 0.2 mg via INTRAVENOUS

## 2012-07-08 MED ORDER — OXYCODONE-ACETAMINOPHEN 5-325 MG PO TABS: 1.0000 | ORAL_TABLET | Freq: Four times a day (QID) | ORAL | Status: AC | PRN

## 2012-07-08 MED ORDER — FENTANYL CITRATE 0.05 MG/ML IJ SOLN
50.0000 ug | INTRAMUSCULAR | Status: DC | PRN
  Administered 2012-07-08: 100 ug via INTRAVENOUS

## 2012-07-08 MED ORDER — LIDOCAINE HCL 1 % IJ SOLN
INTRAMUSCULAR | Status: DC | PRN
  Administered 2012-07-08: 2 mL via INTRADERMAL

## 2012-07-08 MED ORDER — LACTATED RINGERS IV SOLN
INTRAVENOUS | Status: DC
  Administered 2012-07-08 (×2): via INTRAVENOUS

## 2012-07-08 MED ORDER — MIDAZOLAM HCL 2 MG/2ML IJ SOLN
1.0000 mg | INTRAMUSCULAR | Status: DC | PRN
  Administered 2012-07-08: 2 mg via INTRAVENOUS

## 2012-07-08 MED ORDER — ONDANSETRON HCL 4 MG/2ML IJ SOLN
INTRAMUSCULAR | Status: DC | PRN
  Administered 2012-07-08: 4 mg via INTRAVENOUS

## 2012-07-08 MED ORDER — LIDOCAINE HCL (CARDIAC) 20 MG/ML IV SOLN
INTRAVENOUS | Status: DC | PRN
  Administered 2012-07-08: 50 mg via INTRAVENOUS

## 2012-07-08 MED ORDER — SODIUM CHLORIDE 0.9 % IR SOLN
Status: DC | PRN
  Administered 2012-07-08: 9000 mL

## 2012-07-08 SURGICAL SUPPLY — 77 items
APL SKNCLS STERI-STRIP NONHPOA (GAUZE/BANDAGES/DRESSINGS)
BENZOIN TINCTURE PRP APPL 2/3 (GAUZE/BANDAGES/DRESSINGS) IMPLANT
BLADE SURG 15 STRL LF DISP TIS (BLADE) IMPLANT
BLADE SURG 15 STRL SS (BLADE)
BLADE VORTEX 6.0 (BLADE) ×3 IMPLANT
CANISTER OMNI JUG 16 LITER (MISCELLANEOUS) ×3 IMPLANT
CANISTER SUCTION 2500CC (MISCELLANEOUS) IMPLANT
CANNULA 5.75X71 LONG (CANNULA) IMPLANT
CANNULA TWIST IN 8.25X7CM (CANNULA) IMPLANT
CLOTH BEACON ORANGE TIMEOUT ST (SAFETY) ×3 IMPLANT
CUTTER MENISCUS  4.2MM (BLADE) ×1
CUTTER MENISCUS 4.2MM (BLADE) ×2 IMPLANT
DECANTER SPIKE VIAL GLASS SM (MISCELLANEOUS) IMPLANT
DRAPE INCISE IOBAN 66X45 STRL (DRAPES) ×3 IMPLANT
DRAPE STERI 35X30 U-POUCH (DRAPES) ×3 IMPLANT
DRAPE SURG 17X23 STRL (DRAPES) ×3 IMPLANT
DRAPE U-SHAPE 47X51 STRL (DRAPES) ×3 IMPLANT
DRAPE U-SHAPE 76X120 STRL (DRAPES) ×6 IMPLANT
DRSG EMULSION OIL 3X3 NADH (GAUZE/BANDAGES/DRESSINGS) ×3 IMPLANT
DRSG PAD ABDOMINAL 8X10 ST (GAUZE/BANDAGES/DRESSINGS) ×6 IMPLANT
DURAPREP 26ML APPLICATOR (WOUND CARE) ×3 IMPLANT
ELECT REM PT RETURN 9FT ADLT (ELECTROSURGICAL) ×3
ELECTRODE REM PT RTRN 9FT ADLT (ELECTROSURGICAL) ×2 IMPLANT
FIBERSTICK 2 (SUTURE) IMPLANT
GLOVE BIOGEL M STRL SZ7.5 (GLOVE) ×2 IMPLANT
GLOVE BIOGEL PI IND STRL 8 (GLOVE) ×4 IMPLANT
GLOVE BIOGEL PI INDICATOR 8 (GLOVE) ×2
GLOVE ECLIPSE 7.5 STRL STRAW (GLOVE) ×6 IMPLANT
GOWN BRE IMP PREV XXLGXLNG (GOWN DISPOSABLE) ×5 IMPLANT
GOWN PREVENTION PLUS XLARGE (GOWN DISPOSABLE) ×3 IMPLANT
GOWN PREVENTION PLUS XXLARGE (GOWN DISPOSABLE) ×3 IMPLANT
KIT BIO-TENODESIS 3X8 DISP (MISCELLANEOUS)
KIT INSRT BABSR STRL DISP BTN (MISCELLANEOUS) IMPLANT
LASSO SUT 90 DEGREE (SUTURE) IMPLANT
NDL 1/2 CIR CATGUT .05X1.09 (NEEDLE) IMPLANT
NDL HYPO 18GX1.5 BLUNT FILL (NEEDLE) ×1 IMPLANT
NDL SUT 6 .5 CRC .975X.05 MAYO (NEEDLE) IMPLANT
NEEDLE 1/2 CIR CATGUT .05X1.09 (NEEDLE) IMPLANT
NEEDLE HYPO 18GX1.5 BLUNT FILL (NEEDLE) ×3 IMPLANT
NEEDLE MAYO TAPER (NEEDLE)
NS IRRIG 1000ML POUR BTL (IV SOLUTION) IMPLANT
PACK ARTHROSCOPY DSU (CUSTOM PROCEDURE TRAY) ×3 IMPLANT
PACK BASIN DAY SURGERY FS (CUSTOM PROCEDURE TRAY) ×3 IMPLANT
PASSER SUT SWANSON 36MM LOOP (INSTRUMENTS) IMPLANT
PENCIL BUTTON HOLSTER BLD 10FT (ELECTRODE) IMPLANT
SET IRRIG Y TYPE TUR BLADDER L (SET/KITS/TRAYS/PACK) ×3 IMPLANT
SLEEVE SCD COMPRESS KNEE MED (MISCELLANEOUS) ×3 IMPLANT
SLING ARM FOAM STRAP LRG (SOFTGOODS) IMPLANT
SLING ARM FOAM STRAP MED (SOFTGOODS) IMPLANT
SLING ARM FOAM STRAP XLG (SOFTGOODS) ×2 IMPLANT
SLING ARM IMMOBILIZER LRG (SOFTGOODS) IMPLANT
SPONGE GAUZE 4X4 12PLY (GAUZE/BANDAGES/DRESSINGS) ×3 IMPLANT
SPONGE LAP 4X18 X RAY DECT (DISPOSABLE) IMPLANT
STRIP CLOSURE SKIN 1/2X4 (GAUZE/BANDAGES/DRESSINGS) IMPLANT
SUCTION FRAZIER TIP 10 FR DISP (SUCTIONS) IMPLANT
SUT ETHIBOND 2 OS 4 DA (SUTURE) IMPLANT
SUT ETHILON 4 0 PS 2 18 (SUTURE) IMPLANT
SUT LASSO 45 DEGREE (SUTURE) IMPLANT
SUT LASSO 45 DEGREE LEFT (SUTURE) IMPLANT
SUT LASSO 45D RIGHT (SUTURE) IMPLANT
SUT MNCRL AB 3-0 PS2 18 (SUTURE) IMPLANT
SUT PDS AB 0 CT 36 (SUTURE) ×2 IMPLANT
SUT PROLENE 3 0 PS 2 (SUTURE) IMPLANT
SUT TICRON 1 T 12 (SUTURE) IMPLANT
SUT VIC AB 0 CT1 27 (SUTURE)
SUT VIC AB 0 CT1 27XBRD ANBCTR (SUTURE) IMPLANT
SUT VIC AB 1 CT1 27 (SUTURE)
SUT VIC AB 1 CT1 27XBRD ANBCTR (SUTURE) IMPLANT
SUT VIC AB 2-0 SH 27 (SUTURE)
SUT VIC AB 2-0 SH 27XBRD (SUTURE) IMPLANT
SYR 5ML LL (SYRINGE) ×3 IMPLANT
TOWEL OR 17X24 6PK STRL BLUE (TOWEL DISPOSABLE) ×3 IMPLANT
TOWEL OR NON WOVEN STRL DISP B (DISPOSABLE) ×3 IMPLANT
TUBE CONNECTING 20X1/4 (TUBING) IMPLANT
WAND STAR VAC 90 (SURGICAL WAND) ×3 IMPLANT
WATER STERILE IRR 1000ML POUR (IV SOLUTION) ×3 IMPLANT
YANKAUER SUCT BULB TIP NO VENT (SUCTIONS) IMPLANT

## 2012-07-08 NOTE — Transfer of Care (Signed)
Immediate Anesthesia Transfer of Care Note  Patient: Gary Grimes  Procedure(s) Performed: Procedure(s) (LRB): SHOULDER ARTHROSCOPY WITH SUBACROMIAL DECOMPRESSION (Right)  Patient Location: PACU  Anesthesia Type: GA combined with regional for post-op pain  Level of Consciousness: sedated and patient cooperative  Airway & Oxygen Therapy: Patient Spontanous Breathing and Patient connected to face mask oxygen  Post-op Assessment: Report given to PACU RN and Post -op Vital signs reviewed and stable  Post vital signs: Reviewed and stable  Complications: No apparent anesthesia complications

## 2012-07-08 NOTE — Anesthesia Postprocedure Evaluation (Signed)
Anesthesia Post Note  Patient: Gary Grimes  Procedure(s) Performed: Procedure(s) (LRB): SHOULDER ARTHROSCOPY WITH SUBACROMIAL DECOMPRESSION (Right) SHOULDER ARTHROSCOPY WITH DISTAL CLAVICLE RESECTION (Right)  Anesthesia type: General  Patient location: PACU  Post pain: Pain level controlled  Post assessment: Patient's Cardiovascular Status Stable  Last Vitals:  Filed Vitals:   07/08/12 1515  BP: 110/56  Pulse: 75  Temp:   Resp: 19    Post vital signs: Reviewed and stable  Level of consciousness: alert  Complications: No apparent anesthesia complications

## 2012-07-08 NOTE — Brief Op Note (Signed)
07/08/2012  2:59 PM  PATIENT:  Gary Grimes  51 y.o. male  PRE-OPERATIVE DIAGNOSIS:  right shoulder lateral tear and bicep problem  POST-OPERATIVE DIAGNOSIS:  right shoulder lateral tear and bicep problem  PROCEDURE:  Procedure(s) (LRB): SHOULDER ARTHROSCOPY WITH SUBACROMIAL DECOMPRESSION (Right) SHOULDER ARTHROSCOPY WITH DISTAL CLAVICLE RESECTION (Right)  SURGEON:  Surgeon(s) and Role:    * Harvie Junior, MD - Primary  PHYSICIAN ASSISTANT:   ASSISTANTS: bethune   ANESTHESIA:   general  EBL:  Total I/O In: 1800 [I.V.:1800] Out: -   BLOOD ADMINISTERED:none  DRAINS: none   LOCAL MEDICATIONS USED:  MARCAINE     SPECIMEN:  No Specimen  DISPOSITION OF SPECIMEN:  N/A  COUNTS:  YES  TOURNIQUET:  * No tourniquets in log *  DICTATION: .Other Dictation: Dictation Number [id number]  PLAN OF CARE: Discharge to home after PACU  PATIENT DISPOSITION:  PACU - hemodynamically stable.   Delay start of Pharmacological VTE agent (>24hrs) due to surgical blood loss or risk of bleeding: not applicable

## 2012-07-08 NOTE — Anesthesia Preprocedure Evaluation (Signed)
Anesthesia Evaluation  Patient identified by MRN, date of birth, ID band Patient awake    Reviewed: Allergy & Precautions, H&P , NPO status , Patient's Chart, lab work & pertinent test results, reviewed documented beta blocker date and time   Airway Mallampati: II TM Distance: >3 FB Neck ROM: full    Dental   Pulmonary sleep apnea ,          Cardiovascular hypertension,     Neuro/Psych PSYCHIATRIC DISORDERS  Neuromuscular disease    GI/Hepatic negative GI ROS, Neg liver ROS,   Endo/Other  negative endocrine ROS  Renal/GU negative Renal ROS  negative genitourinary   Musculoskeletal   Abdominal   Peds  Hematology negative hematology ROS (+)   Anesthesia Other Findings See surgeon's H&P   Reproductive/Obstetrics negative OB ROS                           Anesthesia Physical Anesthesia Plan  ASA: II  Anesthesia Plan: General   Post-op Pain Management:    Induction: Intravenous  Airway Management Planned: Oral ETT  Additional Equipment:   Intra-op Plan:   Post-operative Plan:   Informed Consent: I have reviewed the patients History and Physical, chart, labs and discussed the procedure including the risks, benefits and alternatives for the proposed anesthesia with the patient or authorized representative who has indicated his/her understanding and acceptance.   Dental Advisory Given  Plan Discussed with: CRNA and Surgeon  Anesthesia Plan Comments:         Anesthesia Quick Evaluation

## 2012-07-08 NOTE — Progress Notes (Signed)
Assisted Dr. Frederick with right, ultrasound guided, interscalene  block. Side rails up, monitors on throughout procedure. See vital signs in flow sheet. Tolerated Procedure well. 

## 2012-07-08 NOTE — H&P (Signed)
PREOPERATIVE H&P  Chief Complaint: r. Shoulder pain  HPI: Gary Grimes is a 51 y.o. male who presents for evaluation of r. Shoulder pain. It has been present for 3 mon and has been worsening. He has failed conservative measures. Pain is rated as severe.  Past Medical History  Diagnosis Date  . Hypertension   . Levator syndrome 2001    history   . Perirectal abscess   . High triglycerides   . High cholesterol   . Anxiety   . Depression     did get seen in er 4/13 for evaluation-psyc  . Arthritis   . Sleep apnea     does use a cpap-will bring dos   Past Surgical History  Procedure Date  . Appendectomy 1984  . Anal fissure repair 08/05/2000    proctoscopy  . Irrigation and debridement abscess 02/18/2012    peri-rectal  . Umbilical hernia repair 10/27/2010  . Shoulder arthroscopy w/ labral repair 08/08/2007    left   History   Social History  . Marital Status: Widowed    Spouse Name: N/A    Number of Children: N/A  . Years of Education: N/A   Social History Main Topics  . Smoking status: Former Smoker    Quit date: 07/06/1977  . Smokeless tobacco: Never Used  . 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 Drug Use: No  . Sexually Active: None   Other Topics Concern  . None   Social History Narrative  . None   Family History  Problem Relation Age of Onset  . Cancer Mother     breast and ovarian   No Known Allergies Prior to Admission medications   Medication Sig Start Date End Date Taking? Authorizing Provider  amphetamine-dextroamphetamine (ADDERALL) 10 MG tablet Take 10 mg by mouth 3 (three) times daily.  06/15/11  Yes Historical Provider, MD  losartan-hydrochlorothiazide (HYZAAR) 100-25 MG per tablet Take 1 tablet by mouth daily.  06/08/11  Yes Historical Provider, MD  amitriptyline (ELAVIL) 25 MG tablet Take 25 mg by mouth at bedtime as needed. For sleep 07/08/11   Historical Provider, MD  amLODipine (NORVASC) 5 MG tablet Take 5  mg by mouth daily.    Historical Provider, MD  citalopram (CELEXA) 20 MG tablet Take 1 tablet (20 mg total) by mouth daily. 04/14/12 04/14/13  Celene Kras, MD  fluticasone (FLONASE) 50 MCG/ACT nasal spray Place 2 sprays into the nose daily as needed. For congestion 04/19/11   Historical Provider, MD  methocarbamol (ROBAXIN) 500 MG tablet Take 500 mg by mouth every 6 (six) hours as needed. For sleep 05/17/11   Historical Provider, MD     Positive ROS: none  All other systems have been reviewed and were otherwise negative with the exception of those mentioned in the HPI and as above.  Physical Exam: Filed Vitals:   07/08/12 1151  BP: 137/84  Pulse: 62  Temp: 98.7 F (37.1 C)  Resp: 20    General: Alert, no acute distress Cardiovascular: No pedal edema Respiratory: No cyanosis, no use of accessory musculature GI: No organomegaly, abdomen is soft and non-tender Skin: No lesions in the area of chief complaint Neurologic: Sensation intact distally Psychiatric: Patient is competent for consent with normal mood and affect Lymphatic: No axillary or cervical lymphadenopathy  MUSCULOSKELETAL: TTP over AC joint // +impingement good ER strength  Assessment/Plan: right shoulder lateral tear and bicep problem Plan for Procedure(s): SHOULDER ARTHROSCOPY  WITH LABRAL REPAIR possible bicepts tenodesis  The risks benefits and alternatives were discussed with the patient including but not limited to the risks of nonoperative treatment, versus surgical intervention including infection, bleeding, nerve injury, malunion, nonunion, hardware prominence, hardware failure, need for hardware removal, blood clots, cardiopulmonary complications, morbidity, mortality, among others, and they were willing to proceed.  Predicted outcome is good, although there will be at least a six to nine month expected recovery.  Harvie Junior, MD 07/08/2012 12:42 PM

## 2012-07-08 NOTE — Anesthesia Procedure Notes (Addendum)
Anesthesia Regional Block:  Interscalene brachial plexus block  Pre-Anesthetic Checklist: ,, timeout performed, Correct Patient, Correct Site, Correct Laterality, Correct Procedure, Correct Position, site marked, Risks and benefits discussed,  Surgical consent,  Pre-op evaluation,  At surgeon's request and post-op pain management  Laterality: Right  Prep: chloraprep       Needles:   Needle Type: Other   (Arrow Echogenic)   Needle Length: 9cm  Needle Gauge: 21    Additional Needles:  Procedures: ultrasound guided Interscalene brachial plexus block Narrative:  Start time: 07/08/2012 12:33 PM End time: 07/08/2012 12:40 PM Injection made incrementally with aspirations every 5 mL.  Performed by: Personally  Anesthesiologist: Aldona Lento, MD  Additional Notes: Ultrasound guidance used to: id relevant anatomy, confirm needle position, local anesthetic spread, avoidance of vascular puncture. Picture saved. No complications. Block performed personally by Janetta Hora. Gelene Mink, MD    Interscalene brachial plexus block Procedure Name: Intubation Date/Time: 07/08/2012 1:01 PM Performed by: Gar Gibbon Pre-anesthesia Checklist: Patient identified, Emergency Drugs available, Suction available and Patient being monitored Patient Re-evaluated:Patient Re-evaluated prior to inductionOxygen Delivery Method: Circle System Utilized Preoxygenation: Pre-oxygenation with 100% oxygen Intubation Type: IV induction Ventilation: Mask ventilation without difficulty Laryngoscope Size: Miller and 2 Grade View: Grade II Tube type: Oral Tube size: 8.0 mm Number of attempts: 1 Airway Equipment and Method: stylet and oral airway Placement Confirmation: ETT inserted through vocal cords under direct vision,  positive ETCO2 and breath sounds checked- equal and bilateral Secured at: 23 cm Tube secured with: Tape Dental Injury: Teeth and Oropharynx as per pre-operative assessment
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
## 2012-07-14 ENCOUNTER — Encounter (HOSPITAL_BASED_OUTPATIENT_CLINIC_OR_DEPARTMENT_OTHER): Payer: Self-pay

## 2013-06-15 ENCOUNTER — Other Ambulatory Visit: Payer: Self-pay | Admitting: *Deleted

## 2013-06-15 ENCOUNTER — Telehealth: Payer: Self-pay | Admitting: Orthopedic Surgery

## 2013-06-15 DIAGNOSIS — M1990 Unspecified osteoarthritis, unspecified site: Secondary | ICD-10-CM

## 2013-06-15 MED ORDER — PREDNISONE 10 MG PO KIT: 10.0000 mg | PACK | ORAL | Status: DC

## 2013-06-15 MED ORDER — METHOCARBAMOL 750 MG PO TABS: 750.0000 mg | ORAL_TABLET | Freq: Three times a day (TID) | ORAL | Status: DC

## 2013-06-15 NOTE — Telephone Encounter (Signed)
Gary Grimes has had a flare of of severe neck  and low back pain, requesting to see you or have you send an order to [facility] for another series of  Injections.  Please advise His # [phone]

## 2013-06-15 NOTE — Telephone Encounter (Signed)
When was last mri ?  Call him in a steroid dose pack 12 days 10 mg  Call him in robaxin 800 tid

## 2013-06-19 NOTE — Telephone Encounter (Signed)
Called in medication 06/15/13 per Dr. Romeo Apple. Patient aware.

## 2013-06-20 ENCOUNTER — Telehealth: Payer: Self-pay | Admitting: Orthopedic Surgery

## 2013-06-20 NOTE — Telephone Encounter (Signed)
Patient has called back per last phone note, states the Prednisone dose pack is helping, however, he said that the other medication prescribed per Dr. Romeo Apple, the methocarbamol (ROBAXIN) 750 MG tablet [[id number]], comes in a 500mg  tablet, which he is asking about receiving, and "doubling" if needed?  [facility] is [facility], as noted in system.  Secondly, he said he would like to "move forward with an appointment, and MRI if possible."     - His Last Office Visit here was: 10/04/08.  He had been referred to neuro [facility])  He had, afterward, self-referred to [facility] neuro.   - His Last MRI, ordered by Dr. Romeo Apple, was:  10/09/12, at [facility] in [place], which [facility] does not show under imaging, but is in scanned documents.    Patient states he is not currently seeing a back specialist or neurosurgeon. His PCP in system is Johny Blamer, ph# [phone].   Please review and advise.

## 2013-06-20 NOTE — Telephone Encounter (Signed)
Ill see him when i get back do not double medicine

## 2013-06-20 NOTE — Telephone Encounter (Signed)
Forwarding to Dr. Harrison 

## 2013-06-20 NOTE — Telephone Encounter (Signed)
Left message for patient to return my call.

## 2013-06-21 NOTE — Telephone Encounter (Signed)
Appointment scheduled.  Also, nurse spoke with patient regarding medication.  Done.

## 2013-06-21 NOTE — Telephone Encounter (Signed)
Spoke with patient and made him an appointment for 07/05/13 at 10:45

## 2013-07-05 ENCOUNTER — Other Ambulatory Visit: Payer: Self-pay | Admitting: *Deleted

## 2013-07-05 ENCOUNTER — Ambulatory Visit (INDEPENDENT_AMBULATORY_CARE_PROVIDER_SITE_OTHER): Payer: BC Managed Care – PPO | Admitting: Orthopedic Surgery

## 2013-07-05 ENCOUNTER — Encounter: Payer: Self-pay | Admitting: Orthopedic Surgery

## 2013-07-05 ENCOUNTER — Telehealth: Payer: Self-pay | Admitting: *Deleted

## 2013-07-05 ENCOUNTER — Ambulatory Visit (INDEPENDENT_AMBULATORY_CARE_PROVIDER_SITE_OTHER): Payer: BC Managed Care – PPO

## 2013-07-05 VITALS — BP 140/86 | Ht 72.0 in | Wt 258.0 lb

## 2013-07-05 DIAGNOSIS — K439 Ventral hernia without obstruction or gangrene: Secondary | ICD-10-CM

## 2013-07-05 DIAGNOSIS — M5136 Other intervertebral disc degeneration, lumbar region: Secondary | ICD-10-CM

## 2013-07-05 DIAGNOSIS — M549 Dorsalgia, unspecified: Secondary | ICD-10-CM

## 2013-07-05 DIAGNOSIS — M5137 Other intervertebral disc degeneration, lumbosacral region: Secondary | ICD-10-CM

## 2013-07-05 DIAGNOSIS — M5030 Other cervical disc degeneration, unspecified cervical region: Secondary | ICD-10-CM

## 2013-07-05 MED ORDER — NAPROXEN 500 MG PO TABS: 500.0000 mg | ORAL_TABLET | Freq: Two times a day (BID) | ORAL | Status: DC

## 2013-07-05 NOTE — Telephone Encounter (Signed)
Faxed referral and office notes to Dr. Danielle Dess with [facility]. Awaiting appointment.

## 2013-07-05 NOTE — Progress Notes (Signed)
Patient ID: Gary Grimes, male   DOB: 05/14/1961, 52 y.o.   MRN: [id number] Chief Complaint  Patient presents with  . Back Pain    Back pain.   The patient complains of extreme lower back pain and pain at cervical 5-6 disc level. The patient complains of pain in his lower back when he stands for long periods or sits for long periods. The symptoms seem to have started 5 years ago and came on gradually with no history of injury. He's had medications for this in the past including naproxen oral steroids, methocarbamol and tramadol. He is currently on methocarbamol. The pain is sharp, throbbing, stabbing and burning and rates it as a 10. The pain is constant at times seems to come and go at times. His long periods of pain when he is on his feet or sits for a long time he said traction sometimes medication will make it better. He said tingling locking and catching.  Review of systems heartburn and joint pain with stiffness and muscle pain numbness and tingling with seasonal allergies other review of systems are negative including constitutional, blurred vision double vision, chest pain, shortness of breath, frequency, urgency, urination problems painful urination flank pain, skin changes poor healing, anxiety or depression easy bleeding or bruising excessive thirst or urination  Has no medical allergies  He has medical problems of hypertension and ADHD  He's had an appendectomy, he's had surgery on both shoulders.  His medications are Adderall, Strattera, citalopram, methocarbamol  Has a family history of asthma and diabetes  Social history he is widowed for several years. 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 He does not smoke or drink he does not use any street drugs.  VS BP 140/86  Ht 6' (1.829 m)  Wt 258 lb (117.028 kg)  BMI 34.98 kg/m2  General and hygiene are normal. Development and nutrition are normal. Body habitus mesomorphic Mood Affect are normal The patient is alert and  oriented x3 Ambulatory status normal  RUE (include skin) Inspection reveals no tenderness or swelling,  range of motion is normal. The joints are located without subluxation or laxity. Motor exam reveals grade 5 strength and the skin is without rash lesion or ulcer  LUE Inspection reveals no tenderness or swelling,  range of motion is normal. The joints are located without subluxation or laxity. Motor exam reveals grade 5 strength;  skin is without rash lesion or ulcer  RLE Inspection reveals no tenderness or swelling;  range of motion is normal. The joints are located without subluxation or laxity. Motor exam reveals grade 5 strength and the skin is without rash lesion or ulcer  LLE Inspection reveals no tenderness or swelling,  range of motion is normal. The joints are located without subluxation or laxity. Motor exam reveals grade 5 strength and  the skin is without rash lesion or ulcer  CDV peripheral pulses are intact without swelling or varicose veins  LYMPH are normal in all 4 extremities with no palpable nodes  DTR are equal and symmetric Balance  is normal  The lumbar spine is tender over the L4-5 interspace with negative straight leg raise. Spinal flexion was to just above the level of the toes and his tiptoe walking was normal. The cervical spine is tender at the base of the cervical spine with decreased range of motion.  The concerning thing for and he is that he has bilateral radicular leg pain down the backs of his legs into his feet with  numbness and tingling despite x-rays that show mild degenerative changes in the facet joints of his lumbar spine. He's had previous CT myelogram neck and lumbar spine by Dr. Danielle Dess.   At this time I can recommend symptomatic treatment with naproxen, muscle relaxers but this is out of the scope of my practice and I referred him to Dr. Danielle Dess again for further management  But did start physical therapy for him start him on Naprosyn as  well.   X-rays show degenerative facet joint changes nothing significant. He does have some listing to the right away from his left side.  Impression degenerative disc disease spine and lumbar with worsening symptoms.  Addendum the patient had an umbilical hernia repair a few years back just above the umbilicus he asked me to check a protuberance that was there and is depressed at he could hear bowel content rumbling. It was tender for him. I advised him to see Dr. Malvin Johns general surgery.

## 2013-07-05 NOTE — Patient Instructions (Addendum)
Referral to Dr Danielle Dess (CT done 2013; CT Myelogram 2009)   Back Pain, Adult Low back pain is very common. About 1 in 5 people have back pain.The cause of low back pain is rarely dangerous. The pain often gets better over time.About half of people with a sudden onset of back pain feel better in just 2 weeks. About 8 in 10 people feel better by 6 weeks.  CAUSES Some common causes of back pain include:  Strain of the muscles or ligaments supporting the spine.  Wear and tear (degeneration) of the spinal discs.  Arthritis.  Direct injury to the back. DIAGNOSIS Most of the time, the direct cause of low back pain is not known.However, back pain can be treated effectively even when the exact cause of the pain is unknown.Answering your caregiver's questions about your overall health and symptoms is one of the most accurate ways to make sure the cause of your pain is not dangerous. If your caregiver needs more information, he or she may order lab work or imaging tests (X-rays or MRIs).However, even if imaging tests show changes in your back, this usually does not require surgery. HOME CARE INSTRUCTIONS For many people, back pain returns.Since low back pain is rarely dangerous, it is often a condition that people can learn to [facility] their own.   Remain active. It is stressful on the back to sit or stand in one place. Do not sit, drive, or stand in one place for more than 30 minutes at a time. Take short walks on level surfaces as soon as pain allows.Try to increase the length of time you walk each day.  Do not stay in bed.Resting more than 1 or 2 days can delay your recovery.  Do not avoid exercise or work.Your body is made to move.It is not dangerous to be active, even though your back may hurt.Your back will likely heal faster if you return to being active before your pain is gone.  Pay attention to your body when you bend and lift. Many people have less discomfortwhen lifting if they  bend their knees, keep the load close to their bodies,and avoid twisting. Often, the most comfortable positions are those that put less stress on your recovering back.  Find a comfortable position to sleep. Use a firm mattress and lie on your side with your knees slightly bent. If you lie on your back, put a pillow under your knees.  Only take over-the-counter or prescription medicines as directed by your caregiver. Over-the-counter medicines to reduce pain and inflammation are often the most helpful.Your caregiver may prescribe muscle relaxant drugs.These medicines help dull your pain so you can more quickly return to your normal activities and healthy exercise.  Put ice on the injured area.  Put ice in a plastic bag.  Place a towel between your skin and the bag.  Leave the ice on for 15-20 minutes, 3-4 times a day for the first 2 to 3 days. After that, ice and heat may be alternated to reduce pain and spasms.  Ask your caregiver about trying back exercises and gentle massage. This may be of some benefit.  Avoid feeling anxious or stressed.Stress increases muscle tension and can worsen back pain.It is important to recognize when you are anxious or stressed and learn ways to manage it.Exercise is a great option. SEEK MEDICAL CARE IF:  You have pain that is not relieved with rest or medicine.  You have pain that does not improve in 1 week.  You have new symptoms.  You are generally not feeling well. SEEK IMMEDIATE MEDICAL CARE IF:   You have pain that radiates from your back into your legs.  You develop new bowel or bladder control problems.  You have unusual weakness or numbness in your arms or legs.  You develop nausea or vomiting.  You develop abdominal pain.  You feel faint. Document Released: 12/14/2005 Document Revised: 06/14/2012 Document Reviewed: 05/04/2011 [facility] Patient Information 2014 [place], [place]. Back Exercises These exercises may help you when  beginning to rehabilitate your injury. Your symptoms may resolve with or without further involvement from your physician, physical therapist or athletic trainer. While completing these exercises, remember:  Do 10 repetitions of each exercise, hold the position for 2-3 seconds perform one to 2 times per day  Restoring tissue flexibility helps normal motion to return to the joints. This allows healthier, less painful movement and activity.  An effective stretch should be held for at least 30 seconds.  A stretch should never be painful. You should only feel a gentle lengthening or release in the stretched tissue. STRETCH  Extension, Prone on Elbows   Lie on your stomach on the floor, a bed will be too soft. Place your palms about shoulder width apart and at the height of your head.  Place your elbows under your shoulders. If this is too painful, stack pillows under your chest.  Allow your body to relax so that your hips drop lower and make contact more completely with the floor.  Hold this position for __________ seconds.  Slowly return to lying flat on the floor. Repeat __________ times. Complete this exercise __________ times per day.  RANGE OF MOTION  Extension, Prone Press Ups   Lie on your stomach on the floor, a bed will be too soft. Place your palms about shoulder width apart and at the height of your head.  Keeping your back as relaxed as possible, slowly straighten your elbows while keeping your hips on the floor. You may adjust the placement of your hands to maximize your comfort. As you gain motion, your hands will come more underneath your shoulders.  Hold this position __________ seconds.  Slowly return to lying flat on the floor. Repeat __________ times. Complete this exercise __________ times per day.  RANGE OF MOTION- Quadruped, Neutral Spine   Assume a hands and knees position on a firm surface. Keep your hands under your shoulders and your knees under your hips. You may  place padding under your knees for comfort.  Drop your head and point your tail bone toward the ground below you. This will round out your low back like an angry cat. Hold this position for __________ seconds.  Slowly lift your head and release your tail bone so that your back sags into a large arch, like an old horse.  Hold this position for __________ seconds.  Repeat this until you feel limber in your low back.  Now, find your "sweet spot." This will be the most comfortable position somewhere between the two previous positions. This is your neutral spine. Once you have found this position, tense your stomach muscles to support your low back.  Hold this position for __________ seconds. Repeat __________ times. Complete this exercise __________ times per day.  STRETCH  Flexion, Single Knee to Chest   Lie on a firm bed or floor with both legs extended in front of you.  Keeping one leg in contact with the floor, bring your opposite knee to  your chest. Hold your leg in place by either grabbing behind your thigh or at your knee.  Pull until you feel a gentle stretch in your low back. Hold __________ seconds.  Slowly release your grasp and repeat the exercise with the opposite side. Repeat __________ times. Complete this exercise __________ times per day.  STRETCH - Hamstrings, Standing  Stand or sit and extend your right / left leg, placing your foot on a chair or foot stool  Keeping a slight arch in your low back and your hips straight forward.  Lead with your chest and lean forward at the waist until you feel a gentle stretch in the back of your right / left knee or thigh. (When done correctly, this exercise requires leaning only a small distance.)  Hold this position for __________ seconds. Repeat __________ times. Complete this stretch __________ times per day. STRENGTHENING  Deep Abdominals, Pelvic Tilt   Lie on a firm bed or floor. Keeping your legs in front of you, bend your  knees so they are both pointed toward the ceiling and your feet are flat on the floor.  Tense your lower abdominal muscles to press your low back into the floor. This motion will rotate your pelvis so that your tail bone is scooping upwards rather than pointing at your feet or into the floor.  With a gentle tension and even breathing, hold this position for __________ seconds. Repeat __________ times. Complete this exercise __________ times per day.  STRENGTHENING  Abdominals, Crunches   Lie on a firm bed or floor. Keeping your legs in front of you, bend your knees so they are both pointed toward the ceiling and your feet are flat on the floor. Cross your arms over your chest.  Slightly tip your chin down without bending your neck.  Tense your abdominals and slowly lift your trunk high enough to just clear your shoulder blades. Lifting higher can put excessive stress on the low back and does not further strengthen your abdominal muscles.  Control your return to the starting position. Repeat __________ times. Complete this exercise __________ times per day.  STRENGTHENING  Quadruped, Opposite UE/LE Lift   Assume a hands and knees position on a firm surface. Keep your hands under your shoulders and your knees under your hips. You may place padding under your knees for comfort.  Find your neutral spine and gently tense your abdominal muscles so that you can maintain this position. Your shoulders and hips should form a rectangle that is parallel with the floor and is not twisted.  Keeping your trunk steady, lift your right hand no higher than your shoulder and then your left leg no higher than your hip. Make sure you are not holding your breath. Hold this position __________ seconds.  Continuing to keep your abdominal muscles tense and your back steady, slowly return to your starting position. Repeat with the opposite arm and leg. Repeat __________ times. Complete this exercise __________ times  per day. Document Released: 01/01/2006 Document Revised: 03/07/2012 Document Reviewed: 03/28/2009 [facility] Patient Information 2014 [place], [place]. Current literature does not support the use of narcotic pain medications for back pain. Tylenol and anti-inflammatories have proven to work just as good without the potential for addiction or abuse

## 2013-07-25 ENCOUNTER — Telehealth: Payer: Self-pay | Admitting: Orthopedic Surgery

## 2013-07-25 ENCOUNTER — Ambulatory Visit: Payer: BC Managed Care – PPO | Attending: Orthopedic Surgery | Admitting: Physical Therapy

## 2013-07-25 DIAGNOSIS — M545 Low back pain, unspecified: Secondary | ICD-10-CM | POA: Insufficient documentation

## 2013-07-25 DIAGNOSIS — M542 Cervicalgia: Secondary | ICD-10-CM | POA: Insufficient documentation

## 2013-07-25 DIAGNOSIS — IMO0001 Reserved for inherently not codable concepts without codable children: Secondary | ICD-10-CM | POA: Insufficient documentation

## 2013-07-25 NOTE — Telephone Encounter (Signed)
Call from [place] at [facility]), ph # [phone], about referral appointment to Dr. Danielle Dess, stating that they have made multiple attempts to reach patient - states they have left 4 messages, and call has not been returned, therefore, no appointment has been scheduled.

## 2013-08-01 ENCOUNTER — Ambulatory Visit: Payer: BC Managed Care – PPO | Attending: Orthopedic Surgery | Admitting: Physical Therapy

## 2013-08-01 DIAGNOSIS — M542 Cervicalgia: Secondary | ICD-10-CM | POA: Insufficient documentation

## 2013-08-01 DIAGNOSIS — M545 Low back pain, unspecified: Secondary | ICD-10-CM | POA: Insufficient documentation

## 2013-08-01 DIAGNOSIS — IMO0001 Reserved for inherently not codable concepts without codable children: Secondary | ICD-10-CM | POA: Insufficient documentation

## 2013-08-03 ENCOUNTER — Ambulatory Visit: Payer: BC Managed Care – PPO | Admitting: Physical Therapy

## 2013-08-04 ENCOUNTER — Ambulatory Visit: Payer: BC Managed Care – PPO | Admitting: Physical Therapy

## 2013-08-09 ENCOUNTER — Ambulatory Visit: Payer: BC Managed Care – PPO | Admitting: Physical Therapy

## 2013-11-22 ENCOUNTER — Encounter (HOSPITAL_COMMUNITY): Payer: Self-pay | Admitting: Emergency Medicine

## 2013-11-22 ENCOUNTER — Emergency Department (HOSPITAL_COMMUNITY)
Admission: EM | Admit: 2013-11-22 | Discharge: 2013-11-22 | Disposition: A | Discharge status: Home / Self Care | Payer: BC Managed Care – PPO | Attending: Emergency Medicine | Admitting: Emergency Medicine

## 2013-11-22 DIAGNOSIS — Z8739 Personal history of other diseases of the musculoskeletal system and connective tissue: Secondary | ICD-10-CM | POA: Insufficient documentation

## 2013-11-22 DIAGNOSIS — F411 Generalized anxiety disorder: Secondary | ICD-10-CM | POA: Insufficient documentation

## 2013-11-22 DIAGNOSIS — Z87891 Personal history of nicotine dependence: Secondary | ICD-10-CM | POA: Insufficient documentation

## 2013-11-22 DIAGNOSIS — F3289 Other specified depressive episodes: Secondary | ICD-10-CM | POA: Insufficient documentation

## 2013-11-22 DIAGNOSIS — I10 Essential (primary) hypertension: Secondary | ICD-10-CM | POA: Insufficient documentation

## 2013-11-22 DIAGNOSIS — K089 Disorder of teeth and supporting structures, unspecified: Secondary | ICD-10-CM | POA: Insufficient documentation

## 2013-11-22 DIAGNOSIS — H9209 Otalgia, unspecified ear: Secondary | ICD-10-CM | POA: Insufficient documentation

## 2013-11-22 DIAGNOSIS — Z872 Personal history of diseases of the skin and subcutaneous tissue: Secondary | ICD-10-CM | POA: Insufficient documentation

## 2013-11-22 DIAGNOSIS — G8911 Acute pain due to trauma: Secondary | ICD-10-CM | POA: Insufficient documentation

## 2013-11-22 DIAGNOSIS — F329 Major depressive disorder, single episode, unspecified: Secondary | ICD-10-CM | POA: Insufficient documentation

## 2013-11-22 DIAGNOSIS — Z862 Personal history of diseases of the blood and blood-forming organs and certain disorders involving the immune mechanism: Secondary | ICD-10-CM | POA: Insufficient documentation

## 2013-11-22 DIAGNOSIS — Z8639 Personal history of other endocrine, nutritional and metabolic disease: Secondary | ICD-10-CM | POA: Insufficient documentation

## 2013-11-22 DIAGNOSIS — Z79899 Other long term (current) drug therapy: Secondary | ICD-10-CM | POA: Insufficient documentation

## 2013-11-22 DIAGNOSIS — K0889 Other specified disorders of teeth and supporting structures: Secondary | ICD-10-CM

## 2013-11-22 MED ORDER — PENICILLIN V POTASSIUM 500 MG PO TABS: 500.0000 mg | ORAL_TABLET | Freq: Four times a day (QID) | ORAL | Status: AC

## 2013-11-22 MED ORDER — HYDROCODONE-ACETAMINOPHEN 5-325 MG PO TABS: 1.0000 | ORAL_TABLET | Freq: Four times a day (QID) | ORAL | Status: DC | PRN

## 2013-11-22 NOTE — ED Provider Notes (Signed)
CSN: [id number]     Arrival date & time 11/22/13  1310 History   This chart was scribed for non-physician practitioner Raymon Mutton, PA-C, working with Toy Baker, MD by Dorothey Baseman, ED Scribe. This patient was seen in room WTR5/WTR5 and the patient's care was started at 2:18 PM.    Chief Complaint  Patient presents with  . Dental Pain   The history is provided by the patient. No language interpreter was used.   HPI Comments: Gary Grimes is a 52 y.o. male who presents to the Emergency Department complaining of a constant, severe, throbbing pain to the left, lower dentition secondary to a dental fracture that occurred a few weeks ago and has been progressively worsening over the past few days that is exacerbated with eating. He reports associated left-sided facial swelling and jaw pain with pain radiation into the left ear onset this morning. He reports using OTC Orajel and Goody Powders at home without relief. He reports that he was last seen by his dentist (Dr. Rozanna Boer) in August, 2014. He reports that he has attempted to contact his dentist and other oral surgeons, but was told he could not be seen until the first week of December, but that his pain became too severe. He denies fever, chills, diaphoresis, sore throat, trouble swallowing, chest pain, shortness of breath. Patient also has a history of HTN, Levator syndrome, and arthritis.   Past Medical History  Diagnosis Date  . Hypertension   . Levator syndrome 2001    history   . Perirectal abscess   . High triglycerides   . High cholesterol   . Anxiety   . Depression     did get seen in er 4/13 for evaluation-psyc  . Arthritis   . Sleep apnea     does use a cpap-will bring dos   Past Surgical History  Procedure Laterality Date  . Appendectomy  1984  . Anal fissure repair  08/05/2000    proctoscopy  . Irrigation and debridement abscess  02/18/2012    peri-rectal  . Umbilical hernia repair  10/27/2010  . Shoulder  arthroscopy w/ labral repair  08/08/2007    left   Family History  Problem Relation Age of Onset  . Cancer Mother     breast and ovarian   History  Substance Use Topics  . Smoking status: Former Smoker    Quit date: 07/06/1977  . Smokeless tobacco: Never Used  . 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  HENT: Positive for dental problem, ear pain and facial swelling. Negative for sore throat and trouble swallowing.   Respiratory: Negative for shortness of breath.   Cardiovascular: Negative for chest pain.  All other systems reviewed and are negative.    Allergies  Review of patient's allergies indicates no known allergies.  Home Medications   Current Outpatient Rx  Name  Route  Sig  Dispense  Refill  . amitriptyline (ELAVIL) 25 MG tablet   Oral   Take 25 mg by mouth at bedtime as needed. For sleep         . amLODipine (NORVASC) 5 MG tablet   Oral   Take 5 mg by mouth daily.         Marland Kitchen amphetamine-dextroamphetamine (ADDERALL) 10 MG tablet   Oral   Take 10 mg by mouth 3 (three) times daily.          Marland Kitchen  atomoxetine (STRATTERA) 60 MG capsule   Oral   Take 60 mg by mouth 2 (two) times daily with a meal.         . clonazePAM (KLONOPIN) 2 MG tablet   Oral   Take 2 mg by mouth daily as needed for anxiety.         Marland Kitchen losartan-hydrochlorothiazide (HYZAAR) 100-25 MG per tablet   Oral   Take 1 tablet by mouth daily.          . methocarbamol (ROBAXIN) 750 MG tablet   Oral   Take 1 tablet (750 mg total) by mouth 3 (three) times daily.   64 tablet   5   . traZODone (DESYREL) 100 MG tablet   Oral   Take 200 mg by mouth at bedtime.         Marland Kitchen HYDROcodone-acetaminophen (NORCO/VICODIN) 5-325 MG per tablet   Oral   Take 1 tablet by mouth every 6 (six) hours as needed.   7 tablet   0   . penicillin v potassium (VEETID) 500 MG tablet    Oral   Take 1 tablet (500 mg total) by mouth 4 (four) times daily.   40 tablet   0    Triage Vitals: BP 172/95  Pulse 92  Temp(Src) 98.6 F (37 C) (Oral)  Resp 17  SpO2 100%  Physical Exam  Nursing note and vitals reviewed. Constitutional: He is oriented to person, place, and time. He appears well-developed and well-nourished. No distress.  HENT:  Head: Normocephalic and atraumatic.  Mouth/Throat: Oropharynx is clear and moist. No oropharyngeal exudate.    Negative facial swelling.  Negative swelling, erythema, inflammation, lesions, sores noted to the posterior oropharynx and tonsils bilaterally. Uvula midline, symmetrical elevation. Negative sublingual lesions. Negative trismus.  Dentition identified, negative decay processes identified. Small tooth missing the 2nd molar of the left mandibular region.   Eyes: Conjunctivae are normal. Pupils are equal, round, and reactive to light.  Neck: Normal range of motion. Neck supple. No tracheal deviation present.  No C spine tenderness to palpation.   Cardiovascular: Normal rate, regular rhythm, normal heart sounds and intact distal pulses.  Exam reveals no gallop and no friction rub.   No murmur heard. Pulses:      Radial pulses are 2+ on the right side, and 2+ on the left side.  Pulmonary/Chest: Effort normal and breath sounds normal. No respiratory distress. He has no wheezes. He has no rales.  Patient is able to speak in full sentences without difficulty.   Abdominal: He exhibits no distension.  Musculoskeletal: Normal range of motion.  Lymphadenopathy:    He has no cervical adenopathy.  Neurological: He is alert and oriented to person, place, and time.  Skin: Skin is warm and dry.  Psychiatric: He has a normal mood and affect. His behavior is normal.    ED Course  Procedures (including critical care time)  DIAGNOSTIC STUDIES: Oxygen Saturation is 100% on room air, normal by my interpretation.    COORDINATION OF  CARE: 2:25 PM- Will discharge patient with antibiotics and pain medication to manage symptoms. Advised patient to follow up with the referred oral surgeon. Advised patient to return to the ED if there are any new or worsening symptoms, especially signs of infection. Discussed treatment plan with patient at bedside and patient verbalized agreement.     Labs Review Labs Reviewed - No data to display Imaging Review No results found.  EKG Interpretation   None  MDM   1. Pain, dental    Filed Vitals:   11/22/13 1316  BP: 172/95  Pulse: 92  Temp: 98.6 F (37 C)  TempSrc: Oral  Resp: 17  SpO2: 100%   I personally performed the services described in this documentation, which was scribed in my presence. The recorded information has been reviewed and is accurate.  Patient presenting to emergency department with dental pain that has been ongoing for the past couple of days. Alert and oriented. GCS 15. Negative facial swelling noted. Negative trismus. Uvula midline, symmetrical elevation. Unremarkable oral exam. Dentition in stable condition. Diagrammed second molar of left mandibular jawline noted with discomfort upon palpation-negative findings of abscess or cyst formation. Negative sublingual lesions. Doubt peritonsillar abscess. Doubt Ludwig's angina. Suspicion of dental pain secondary to trauma to tooth, diagrammed. Patient stable, afebrile. Discharged patient with antibiotics and pain medications-discussed course, precautions, disposal technique. Referred patient to dentist and oral surgeon. Dental service referral sheet given in discharge paperwork. Discussed with patient to rest and stay hydrated. Discussed with patient to closely monitor symptoms and if symptoms are to worsen or change report back to emergency department-strict return instructions given. Patient agreed to plan of care, understood, all questions answered.    Raymon Mutton, PA-C 11/23/13 1205

## 2013-11-22 NOTE — ED Notes (Signed)
Pt escorted to discharge window. Pt verbalized understanding discharge instructions. In no acute distress.  

## 2013-11-22 NOTE — ED Notes (Signed)
Bed: WTR5 Expected date:  Expected time:  Means of arrival:  Comments: 

## 2013-11-22 NOTE — ED Notes (Signed)
He c/o lower left back toothache.  He states he has had "partial root canal surgery" on this tooth in the past; and states recently one of the "roots broke".  He is in no distress.  He states "I just with they'd pull this stupid thing".

## 2013-11-24 NOTE — ED Provider Notes (Signed)
Medical screening examination/treatment/procedure(s) were performed by non-physician practitioner and as supervising physician I was immediately available for consultation/collaboration.   Aulton T Abbagale Goguen, MD 11/24/13 1536 

## 2015-01-17 ENCOUNTER — Other Ambulatory Visit (INDEPENDENT_AMBULATORY_CARE_PROVIDER_SITE_OTHER): Payer: Self-pay | Admitting: General Surgery

## 2015-01-25 ENCOUNTER — Encounter (HOSPITAL_COMMUNITY)
Admission: RE | Admit: 2015-01-25 | Discharge: 2015-01-25 | Disposition: A | Discharge status: Home / Self Care | Payer: BLUE CROSS/BLUE SHIELD | Source: Ambulatory Visit | Attending: General Surgery | Admitting: General Surgery

## 2015-01-25 ENCOUNTER — Ambulatory Visit (HOSPITAL_COMMUNITY)
Admission: RE | Admit: 2015-01-25 | Discharge: 2015-01-25 | Disposition: A | Discharge status: Home / Self Care | Payer: BLUE CROSS/BLUE SHIELD | Source: Ambulatory Visit | Attending: Anesthesiology | Admitting: Anesthesiology

## 2015-01-25 ENCOUNTER — Encounter (HOSPITAL_COMMUNITY): Payer: Self-pay

## 2015-01-25 DIAGNOSIS — Z01812 Encounter for preprocedural laboratory examination: Secondary | ICD-10-CM | POA: Diagnosis not present

## 2015-01-25 DIAGNOSIS — I10 Essential (primary) hypertension: Secondary | ICD-10-CM | POA: Insufficient documentation

## 2015-01-25 DIAGNOSIS — Z87891 Personal history of nicotine dependence: Secondary | ICD-10-CM | POA: Insufficient documentation

## 2015-01-25 DIAGNOSIS — Z01818 Encounter for other preprocedural examination: Secondary | ICD-10-CM | POA: Diagnosis not present

## 2015-01-25 DIAGNOSIS — K469 Unspecified abdominal hernia without obstruction or gangrene: Secondary | ICD-10-CM | POA: Insufficient documentation

## 2015-01-25 DIAGNOSIS — Z01810 Encounter for preprocedural cardiovascular examination: Secondary | ICD-10-CM | POA: Insufficient documentation

## 2015-01-25 HISTORY — DX: Gastro-esophageal reflux disease without esophagitis: K21.9

## 2015-01-25 HISTORY — DX: Angina pectoris, unspecified: I20.9

## 2015-01-25 LAB — CBC
HCT: 47.4 % (ref 39.0–52.0)
HEMOGLOBIN: 16.3 g/dL (ref 13.0–17.0)
MCH: 30.7 pg (ref 26.0–34.0)
MCHC: 34.4 g/dL (ref 30.0–36.0)
MCV: 89.3 fL (ref 78.0–100.0)
PLATELETS: 287 10*3/uL (ref 150–400)
RBC: 5.31 MIL/uL (ref 4.22–5.81)
RDW: 13.4 % (ref 11.5–15.5)
WBC: 7.3 10*3/uL (ref 4.0–10.5)

## 2015-01-25 LAB — BASIC METABOLIC PANEL
ANION GAP: 6 (ref 5–15)
BUN: 15 mg/dL (ref 6–23)
CALCIUM: 9.6 mg/dL (ref 8.4–10.5)
CHLORIDE: 106 mmol/L (ref 96–112)
CO2: 29 mmol/L (ref 19–32)
CREATININE: 0.9 mg/dL (ref 0.50–1.35)
Glucose, Bld: 103 mg/dL — ABNORMAL HIGH (ref 70–99)
Potassium: 4.3 mmol/L (ref 3.5–5.1)
Sodium: 141 mmol/L (ref 135–145)

## 2015-01-25 NOTE — Patient Instructions (Signed)
[place]  01/25/2015   Your procedure is scheduled on:    Tuesday January 29, 2015  Report to [facility] Main Entrance and follow signs to  [facility] aarive at 10:45 AM  Call this number if you have problems the morning of surgery [phone] or Presurgical Testing [phone].   Remember:  Do not eat food or drink liquids :After Midnight.  For Living Will and/or Health Care Power Attorney Forms: please provide copy for your medical record, may bring AM of surgery (forms should be already notarized-we do not provide this service).  For Cpap use: bring mask and tubing only.     Take these medicines the morning of surgery with A SIP OF WATER: NONE                               You may not have any metal on your body including hair pins and piercings  Do not wear jewelry, lotions, powders, colognes or deodorant.  Men may shave face and neck.               Do not bring valuables to the hospital. [place].  Contacts, dentures or bridgework may not be worn into surgery.  Leave suitcase in the car. After surgery it may be brought to your room.  For patients admitted to the hospital, checkout time is 11:00 AM the day of discharge.  ________________________________________________________________________  [facility] - Preparing for Surgery Before surgery, you can play an important role.  Because skin is not sterile, your skin needs to be as free of germs as possible.  You can reduce the number of germs on your skin by washing with CHG (chlorahexidine gluconate) soap before surgery.  CHG is an antiseptic cleaner which kills germs and bonds with the skin to continue killing germs even after washing. Please DO NOT use if you have an allergy to CHG or antibacterial soaps.  If your skin becomes reddened/irritated stop using the CHG and inform your nurse when you arrive at Short Stay. Do not shave (including legs and underarms) for at  least 48 hours prior to the first CHG shower.  You may shave your face/neck. Please follow these instructions carefully:  1.  Shower with CHG Soap the night before surgery and the  morning of Surgery.  2.  If you choose to wash your hair, wash your hair first as usual with your  normal  shampoo.  3.  After you shampoo, rinse your hair and body thoroughly to remove the  shampoo.                           4.  Use CHG as you would any other liquid soap.  You can apply chg directly  to the skin and wash                       Gently with a scrungie or clean washcloth.  5.  Apply the CHG Soap to your body ONLY FROM THE NECK DOWN.   Do not use on face/ open                           Wound or open sores. Avoid contact with eyes, ears mouth and genitals (private parts).  Wash face,  Genitals (private parts) with your normal soap.             6.  Wash thoroughly, paying special attention to the area where your surgery  will be performed.  7.  Thoroughly rinse your body with warm water from the neck down.  8.  DO NOT shower/wash with your normal soap after using and rinsing off  the CHG Soap.                9.  Pat yourself dry with a clean towel.            10.  Wear clean pajamas.            11.  Place clean sheets on your bed the night of your first shower and do not  sleep with pets. Day of Surgery : Do not apply any lotions/deodorants the morning of surgery.  Please wear clean clothes to the hospital/surgery center.  FAILURE TO FOLLOW THESE INSTRUCTIONS MAY RESULT IN THE CANCELLATION OF YOUR SURGERY PATIENT SIGNATURE_________________________________  NURSE SIGNATURE__________________________________  ________________________________________________________________________

## 2015-01-28 MED ORDER — DEXTROSE 5 % IV SOLN
3.0000 g | INTRAVENOUS | Status: AC
  Administered 2015-01-29: 3 g via INTRAVENOUS
  Filled 2015-01-28: qty 3000

## 2015-01-29 ENCOUNTER — Observation Stay (HOSPITAL_COMMUNITY)
Admission: RE | Admit: 2015-01-29 | Discharge: 2015-01-30 | Disposition: A | Discharge status: Home / Self Care | Payer: BLUE CROSS/BLUE SHIELD | Source: Ambulatory Visit | Attending: General Surgery | Admitting: General Surgery

## 2015-01-29 ENCOUNTER — Encounter (HOSPITAL_COMMUNITY)
Admission: RE | Disposition: A | Discharge status: Home / Self Care | Payer: Self-pay | Source: Ambulatory Visit | Attending: General Surgery

## 2015-01-29 ENCOUNTER — Encounter (HOSPITAL_COMMUNITY): Payer: Self-pay | Admitting: *Deleted

## 2015-01-29 ENCOUNTER — Ambulatory Visit (HOSPITAL_COMMUNITY): Payer: BLUE CROSS/BLUE SHIELD | Admitting: Certified Registered Nurse Anesthetist

## 2015-01-29 DIAGNOSIS — I10 Essential (primary) hypertension: Secondary | ICD-10-CM | POA: Diagnosis not present

## 2015-01-29 DIAGNOSIS — Z87891 Personal history of nicotine dependence: Secondary | ICD-10-CM | POA: Insufficient documentation

## 2015-01-29 DIAGNOSIS — K219 Gastro-esophageal reflux disease without esophagitis: Secondary | ICD-10-CM | POA: Diagnosis not present

## 2015-01-29 DIAGNOSIS — K432 Incisional hernia without obstruction or gangrene: Secondary | ICD-10-CM

## 2015-01-29 DIAGNOSIS — G4730 Sleep apnea, unspecified: Secondary | ICD-10-CM | POA: Diagnosis not present

## 2015-01-29 DIAGNOSIS — K436 Other and unspecified ventral hernia with obstruction, without gangrene: Secondary | ICD-10-CM | POA: Diagnosis not present

## 2015-01-29 DIAGNOSIS — K439 Ventral hernia without obstruction or gangrene: Secondary | ICD-10-CM | POA: Diagnosis present

## 2015-01-29 HISTORY — PX: INSERTION OF MESH: SHX5868

## 2015-01-29 HISTORY — PX: VENTRAL HERNIA REPAIR: SHX424

## 2015-01-29 HISTORY — DX: Incisional hernia without obstruction or gangrene: K43.2

## 2015-01-29 SURGERY — REPAIR, HERNIA, VENTRAL, LAPAROSCOPIC
Anesthesia: General | Site: Abdomen

## 2015-01-29 MED ORDER — SUCCINYLCHOLINE CHLORIDE 20 MG/ML IJ SOLN
INTRAMUSCULAR | Status: DC | PRN
  Administered 2015-01-29: 140 mg via INTRAVENOUS

## 2015-01-29 MED ORDER — ESCITALOPRAM OXALATE 20 MG PO TABS
20.0000 mg | ORAL_TABLET | Freq: Every day | ORAL | Status: DC
  Administered 2015-01-30: 20 mg via ORAL
  Filled 2015-01-29 (×2): qty 1

## 2015-01-29 MED ORDER — ROCURONIUM BROMIDE 100 MG/10ML IV SOLN
INTRAVENOUS | Status: DC | PRN
  Administered 2015-01-29 (×2): 10 mg via INTRAVENOUS
  Administered 2015-01-29: 40 mg via INTRAVENOUS

## 2015-01-29 MED ORDER — LIDOCAINE HCL (CARDIAC) 20 MG/ML IV SOLN
INTRAVENOUS | Status: DC | PRN
  Administered 2015-01-29: 100 mg via INTRAVENOUS

## 2015-01-29 MED ORDER — MORPHINE SULFATE 2 MG/ML IJ SOLN
2.0000 mg | INTRAMUSCULAR | Status: DC | PRN
  Administered 2015-01-29 – 2015-01-30 (×2): 2 mg via INTRAVENOUS
  Filled 2015-01-29 (×3): qty 1

## 2015-01-29 MED ORDER — KETOROLAC TROMETHAMINE 30 MG/ML IJ SOLN
30.0000 mg | Freq: Four times a day (QID) | INTRAMUSCULAR | Status: AC
Start: 2015-01-29 — End: 2015-01-30
  Administered 2015-01-29 – 2015-01-30 (×3): 30 mg via INTRAVENOUS
  Filled 2015-01-29 (×3): qty 1

## 2015-01-29 MED ORDER — LACTATED RINGERS IR SOLN
Status: DC | PRN
  Administered 2015-01-29: 1000 mL

## 2015-01-29 MED ORDER — OXYCODONE-ACETAMINOPHEN 5-325 MG PO TABS
1.0000 | ORAL_TABLET | ORAL | Status: DC | PRN
  Administered 2015-01-30: 2 via ORAL
  Filled 2015-01-29: qty 2

## 2015-01-29 MED ORDER — CHLORHEXIDINE GLUCONATE 4 % EX LIQD: 1.0000 "application " | Freq: Once | CUTANEOUS | Status: DC

## 2015-01-29 MED ORDER — GLYCOPYRROLATE 0.2 MG/ML IJ SOLN
INTRAMUSCULAR | Status: DC | PRN
  Administered 2015-01-29: .8 mg via INTRAVENOUS

## 2015-01-29 MED ORDER — PROPOFOL 10 MG/ML IV BOLUS
INTRAVENOUS | Status: AC
  Filled 2015-01-29: qty 20

## 2015-01-29 MED ORDER — DEXTROSE IN LACTATED RINGERS 5 % IV SOLN
INTRAVENOUS | Status: DC
  Administered 2015-01-29: 18:00:00 via INTRAVENOUS
  Administered 2015-01-30: 75 mL/h via INTRAVENOUS

## 2015-01-29 MED ORDER — MEPERIDINE HCL 50 MG/ML IJ SOLN: 6.2500 mg | INTRAMUSCULAR | Status: DC | PRN

## 2015-01-29 MED ORDER — LACTATED RINGERS IV SOLN
INTRAVENOUS | Status: DC
  Administered 2015-01-29: 1000 mL via INTRAVENOUS

## 2015-01-29 MED ORDER — PROMETHAZINE HCL 25 MG/ML IJ SOLN: 6.2500 mg | INTRAMUSCULAR | Status: DC | PRN

## 2015-01-29 MED ORDER — KETOROLAC TROMETHAMINE 30 MG/ML IJ SOLN
INTRAMUSCULAR | Status: AC
  Filled 2015-01-29: qty 1

## 2015-01-29 MED ORDER — BUPIVACAINE-EPINEPHRINE 0.25% -1:200000 IJ SOLN
INTRAMUSCULAR | Status: DC | PRN
  Administered 2015-01-29: 20 mL

## 2015-01-29 MED ORDER — MIDAZOLAM HCL 2 MG/2ML IJ SOLN
INTRAMUSCULAR | Status: AC
  Filled 2015-01-29: qty 2

## 2015-01-29 MED ORDER — ENOXAPARIN SODIUM 40 MG/0.4ML ~~LOC~~ SOLN
40.0000 mg | SUBCUTANEOUS | Status: DC
  Administered 2015-01-30: 40 mg via SUBCUTANEOUS
  Filled 2015-01-29 (×2): qty 0.4

## 2015-01-29 MED ORDER — MIDAZOLAM HCL 5 MG/5ML IJ SOLN
INTRAMUSCULAR | Status: DC | PRN
  Administered 2015-01-29: 2 mg via INTRAVENOUS

## 2015-01-29 MED ORDER — AMITRIPTYLINE HCL 25 MG PO TABS
25.0000 mg | ORAL_TABLET | Freq: Every evening | ORAL | Status: DC | PRN
  Filled 2015-01-29: qty 1

## 2015-01-29 MED ORDER — EPHEDRINE SULFATE 50 MG/ML IJ SOLN
INTRAMUSCULAR | Status: DC | PRN
  Administered 2015-01-29: 5 mg via INTRAVENOUS

## 2015-01-29 MED ORDER — FENTANYL CITRATE 0.05 MG/ML IJ SOLN
INTRAMUSCULAR | Status: DC | PRN
  Administered 2015-01-29 (×4): 50 ug via INTRAVENOUS

## 2015-01-29 MED ORDER — NEOSTIGMINE METHYLSULFATE 10 MG/10ML IV SOLN
INTRAVENOUS | Status: DC | PRN
  Administered 2015-01-29: 4 mg via INTRAVENOUS

## 2015-01-29 MED ORDER — ONDANSETRON HCL 4 MG/2ML IJ SOLN
INTRAMUSCULAR | Status: AC
  Filled 2015-01-29: qty 2

## 2015-01-29 MED ORDER — LOSARTAN POTASSIUM 50 MG PO TABS
100.0000 mg | ORAL_TABLET | Freq: Every evening | ORAL | Status: DC
  Filled 2015-01-29 (×2): qty 2

## 2015-01-29 MED ORDER — 0.9 % SODIUM CHLORIDE (POUR BTL) OPTIME
TOPICAL | Status: DC | PRN
  Administered 2015-01-29: 1000 mL

## 2015-01-29 MED ORDER — PROPOFOL 10 MG/ML IV BOLUS
INTRAVENOUS | Status: DC | PRN
  Administered 2015-01-29: 200 mg via INTRAVENOUS

## 2015-01-29 MED ORDER — TRAZODONE HCL 100 MG PO TABS
100.0000 mg | ORAL_TABLET | Freq: Every day | ORAL | Status: DC
  Administered 2015-01-30: 200 mg via ORAL
  Filled 2015-01-29 (×2): qty 2

## 2015-01-29 MED ORDER — KETOROLAC TROMETHAMINE 30 MG/ML IJ SOLN
INTRAMUSCULAR | Status: DC | PRN
  Administered 2015-01-29: 30 mg via INTRAVENOUS

## 2015-01-29 MED ORDER — FENTANYL CITRATE 0.05 MG/ML IJ SOLN
INTRAMUSCULAR | Status: AC
  Filled 2015-01-29: qty 5

## 2015-01-29 MED ORDER — BUPIVACAINE-EPINEPHRINE 0.25% -1:200000 IJ SOLN
INTRAMUSCULAR | Status: AC
  Filled 2015-01-29: qty 1

## 2015-01-29 MED ORDER — ONDANSETRON HCL 4 MG/2ML IJ SOLN: 4.0000 mg | Freq: Four times a day (QID) | INTRAMUSCULAR | Status: DC | PRN

## 2015-01-29 MED ORDER — AMPHETAMINE-DEXTROAMPHETAMINE 10 MG PO TABS
10.0000 mg | ORAL_TABLET | Freq: Three times a day (TID) | ORAL | Status: DC
  Administered 2015-01-30: 10 mg via ORAL
  Filled 2015-01-29: qty 1

## 2015-01-29 MED ORDER — GLYCOPYRROLATE 0.2 MG/ML IJ SOLN
INTRAMUSCULAR | Status: AC
  Filled 2015-01-29: qty 4

## 2015-01-29 MED ORDER — HYDROMORPHONE HCL 1 MG/ML IJ SOLN: 0.2500 mg | INTRAMUSCULAR | Status: DC | PRN

## 2015-01-29 MED ORDER — ONDANSETRON HCL 4 MG PO TABS: 4.0000 mg | ORAL_TABLET | Freq: Four times a day (QID) | ORAL | Status: DC | PRN

## 2015-01-29 SURGICAL SUPPLY — 59 items
ADH SKN CLS APL DERMABOND .7 (GAUZE/BANDAGES/DRESSINGS) ×1
APL SKNCLS STERI-STRIP NONHPOA (GAUZE/BANDAGES/DRESSINGS)
APPLIER CLIP 5 13 M/L LIGAMAX5 (MISCELLANEOUS)
APR CLP MED LRG 5 ANG JAW (MISCELLANEOUS)
BENZOIN TINCTURE PRP APPL 2/3 (GAUZE/BANDAGES/DRESSINGS) IMPLANT
BINDER ABDOMINAL 12 ML 46-62 (SOFTGOODS) ×1 IMPLANT
CHLORAPREP W/TINT 26ML (MISCELLANEOUS) ×2 IMPLANT
CLIP APPLIE 5 13 M/L LIGAMAX5 (MISCELLANEOUS) IMPLANT
COVER SURGICAL LIGHT HANDLE (MISCELLANEOUS) ×2 IMPLANT
DECANTER SPIKE VIAL GLASS SM (MISCELLANEOUS) ×2 IMPLANT
DERMABOND ADVANCED (GAUZE/BANDAGES/DRESSINGS) ×1
DERMABOND ADVANCED .7 DNX12 (GAUZE/BANDAGES/DRESSINGS) IMPLANT
DEVICE RELIATACK FIXATION (MISCELLANEOUS) ×1 IMPLANT
DEVICE SECURE STRAP 25 ABSORB (INSTRUMENTS) IMPLANT
DEVICE TROCAR PUNCTURE CLOSURE (ENDOMECHANICALS) IMPLANT
DISSECTOR BLUNT TIP ENDO 5MM (MISCELLANEOUS) IMPLANT
DRAPE LAPAROSCOPIC ABDOMINAL (DRAPES) ×2 IMPLANT
DRAPE UTILITY XL STRL (DRAPES) ×2 IMPLANT
DRSG PAD ABDOMINAL 8X10 ST (GAUZE/BANDAGES/DRESSINGS) ×1 IMPLANT
ELECT REM PT RETURN 9FT ADLT (ELECTROSURGICAL) ×2
ELECTRODE REM PT RTRN 9FT ADLT (ELECTROSURGICAL) ×1 IMPLANT
GLOVE BIO SURGEON STRL SZ7.5 (GLOVE) ×1 IMPLANT
GLOVE BIOGEL PI IND STRL 7.0 (GLOVE) ×1 IMPLANT
GLOVE BIOGEL PI IND STRL 7.5 (GLOVE) IMPLANT
GLOVE BIOGEL PI INDICATOR 7.0 (GLOVE) ×1
GLOVE BIOGEL PI INDICATOR 7.5 (GLOVE) ×3
GLOVE SURG SS PI 7.5 STRL IVOR (GLOVE) ×4 IMPLANT
GOWN STRL REUS W/TWL LRG LVL3 (GOWN DISPOSABLE) ×3 IMPLANT
GOWN STRL REUS W/TWL XL LVL3 (GOWN DISPOSABLE) ×4 IMPLANT
IV LACTATED RINGERS 1000ML (IV SOLUTION) ×1 IMPLANT
KIT BASIN OR (CUSTOM PROCEDURE TRAY) ×2 IMPLANT
LIQUID BAND (GAUZE/BANDAGES/DRESSINGS) ×1 IMPLANT
MARKER SKIN DUAL TIP RULER LAB (MISCELLANEOUS) ×2 IMPLANT
MESH VENTRALIGHT ST 4X6IN (Mesh General) ×1 IMPLANT
NDL SPNL 22GX3.5 QUINCKE BK (NEEDLE) ×1 IMPLANT
NEEDLE SPNL 22GX3.5 QUINCKE BK (NEEDLE) ×2 IMPLANT
NS IRRIG 1000ML POUR BTL (IV SOLUTION) ×2 IMPLANT
PENCIL BUTTON HOLSTER BLD 10FT (ELECTRODE) IMPLANT
SCISSORS LAP 5X35 DISP (ENDOMECHANICALS) ×1 IMPLANT
SET IRRIG TUBING LAPAROSCOPIC (IRRIGATION / IRRIGATOR) ×1 IMPLANT
SHEARS HARMONIC ACE PLUS 36CM (ENDOMECHANICALS) ×1 IMPLANT
SLEEVE ADV FIXATION 5X100MM (TROCAR) ×1 IMPLANT
SLEEVE Z-THREAD 5X100MM (TROCAR) IMPLANT
SOLUTION ANTI FOG 6CC (MISCELLANEOUS) ×2 IMPLANT
STRIP CLOSURE SKIN 1/2X4 (GAUZE/BANDAGES/DRESSINGS) IMPLANT
SUT MNCRL AB 4-0 PS2 18 (SUTURE) ×2 IMPLANT
SUT NOVA NAB GS-21 0 18 T12 DT (SUTURE) IMPLANT
SUT PROLENE 0 CT 1 CR/8 (SUTURE) IMPLANT
TACKER 5MM HERNIA 3.5CML NAB (ENDOMECHANICALS) ×1 IMPLANT
TOWEL OR 17X26 10 PK STRL BLUE (TOWEL DISPOSABLE) ×2 IMPLANT
TRAY FOLEY CATH 14FRSI W/METER (CATHETERS) IMPLANT
TRAY LAPAROSCOPIC (CUSTOM PROCEDURE TRAY) ×2 IMPLANT
TROCAR ADV FIXATION 11X100MM (TROCAR) IMPLANT
TROCAR ADV FIXATION 5X100MM (TROCAR) ×1 IMPLANT
TROCAR BLADELESS OPT 5 100 (ENDOMECHANICALS) ×2 IMPLANT
TROCAR BLADELESS OPT 5 75 (ENDOMECHANICALS) ×1 IMPLANT
TROCAR XCEL NON-BLD 11X100MML (ENDOMECHANICALS) IMPLANT
TROCAR XCEL UNIV SLVE 11M 100M (ENDOMECHANICALS) IMPLANT
TUBING INSUFFLATION 10FT LAP (TUBING) ×2 IMPLANT

## 2015-01-29 NOTE — H&P (Signed)
Gary Grimes 01/17/2015 9:57 AM Location: [facility] Patient #: [id number] DOB: [birth year] Widowed / Language: Gary Grimes / Race: White Male  History of Present Illness [name] Lurlean Kernen MD; 01/17/2015 10:17 AM) Patient words: hernia.  The patient is a 54 year old male who presents with an incisional hernia. He returns to the office with a history of open umbilical hernia repair performed with a 6 cm ventral patch in 2011. About a year later he presented with a recurrent hernia superior to this above the umbilicus. It was minimally symptomatic and he elected to forego repair. It however has gotten gradually bigger and a couple of weeks ago he had a significant episode of pain and swelling with a hernia. He says he has some occasional nausea associated with the discomfort and also some diarrhea and urgency of bowel movements. He had a colonoscopy and polypectomy about 5 years ago. No bleeding. No vomiting. It is worse with physical activity.   Other Problems [name], CMA; 9/38/1017 5:10 AM) Umbilical Hernia Repair  Past Surgical History [name], CMA; 01/17/2015 9:57 AM) Anal Fissure Repair Appendectomy Oral Surgery Vasectomy  Diagnostic Studies History [name], CMA; 01/17/2015 9:57 AM) Colonoscopy 1-5 years ago  Allergies [name], CMA; 01/17/2015 9:58 AM) No Known Drug Allergies01/21/2016  Medication History ([name], CMA; 01/17/2015 9:59 AM) Losartan Potassium (100MG  Tablet, Oral) Active.  Social History [name], [place]; 01/17/2015 9:57 AM) Alcohol use Occasional alcohol use. Caffeine use Coffee. No drug use Tobacco use Former smoker.  Family History [name], [place]; 01/17/2015 9:57 AM) Cancer Mother.  Review of Systems ([name]; 01/17/2015 9:57 AM) General Present- Weight Gain. Not Present- Appetite Loss, Chills, Fatigue, Fever, Night Sweats and Weight Loss. Skin Not Present- Change in Wart/Mole, Dryness, Hives,  Jaundice, New Lesions, Non-Healing Wounds, Rash and Ulcer. HEENT Not Present- Earache, Hearing Loss, Hoarseness, Nose Bleed, Oral Ulcers, Ringing in the Ears, Seasonal Allergies, Sinus Pain, Sore Throat, Visual Disturbances, Wears glasses/contact lenses and Yellow Eyes. Respiratory Not Present- Bloody sputum, Chronic Cough, Difficulty Breathing, Snoring and Wheezing. Breast Not Present- Breast Mass, Breast Pain, Nipple Discharge and Skin Changes. Cardiovascular Not Present- Chest Pain, Difficulty Breathing Lying Down, Leg Cramps, Palpitations, Rapid Heart Rate, Shortness of Breath and Swelling of Extremities. Gastrointestinal Present- Abdominal Pain and Change in Bowel Habits. Not Present- Bloating, Bloody Stool, Chronic diarrhea, Constipation, Difficulty Swallowing, Excessive gas, Gets full quickly at meals, Hemorrhoids, Indigestion, Nausea, Rectal Pain and Vomiting. Male Genitourinary Not Present- Blood in Urine, Change in Urinary Stream, Frequency, Impotence, Nocturia, Painful Urination, Urgency and Urine Leakage. Neurological Not Present- Decreased Memory, Fainting, Headaches, Numbness, Seizures, Tingling, Tremor, Trouble walking and Weakness. Endocrine Not Present- Cold Intolerance, Excessive Hunger, Hair Changes, Heat Intolerance, Hot flashes and New Diabetes. Hematology Not Present- Easy Bruising, Excessive bleeding, Gland problems, HIV and Persistent Infections.   Vitals ([name] CMA; 01/17/2015 9:58 AM) 01/17/2015 9:58 AM Weight: 263.8 lb Height: 72in Body Surface Area: 2.47 m Body Mass Index: 35.78 kg/m Pulse: 81 (Regular)  BP: 134/77 (Sitting, Left Arm, Standard)    Physical Exam [name] Natasja Niday MD; 01/17/2015 10:19 AM) The physical exam findings are as follows: Note:General: Alert, moderately overweight Caucasian male, in no distress Skin: Warm and dry without rash or infection. HEENT: No palpable masses or thyromegaly. Sclera nonicteric. Pupils equal round and  reactive. Lungs: Breath sounds clear and equal. No wheezing or increased work of breathing. Cardiovascular: Regular rate and rhythm without murmer. No JVD or edema. Peripheral pulses intact.  No carotid bruits. Abdomen: Nondistended. Soft and nontender. No masses palpable. No organomegaly. There is a partially reducible hernia several centimeters in size above the umbilicus several centimeters. It is mildly tender. Extremities: No edema or joint swelling or deformity. No chronic venous stasis changes. Neurologic: Alert and fully oriented. Gait normal. No focal weakness. Psychiatric: Normal mood and affect. Thought content appropriate with normal judgement and insight    Assessment & Plan [name] [name] MD; 01/17/2015 10:21 AM) HERNIA, VENTRAL ([phone]  K43.9) Impression: He has a ventral hernia that is presenting probably just above his previous umbilical hernia repair. As we had discussed a couple of years ago I think he would be best served with a laparoscopic repair will be to place a larger piece of mesh to cover a majority of the epigastrium. I discussed the indication for this and the nature of the repair as well as expected recovery and risks of general anesthesia, bleeding, infection, bowel injury and recurrence. All his questions were answered. He would like to proceed. Current Plans  Schedule for Surgery  Laparoscopic repair of ventral hernia with mesh and plan overnight hospitalization   Signed by Edward Jolly, MD (01/17/2015 10:24 AM)

## 2015-01-29 NOTE — Anesthesia Procedure Notes (Signed)
Procedure Name: Intubation Date/Time: 01/29/2015 2:32 PM Performed by: Montel Clock Pre-anesthesia Checklist: Patient identified, Emergency Drugs available, Suction available, Patient being monitored and Timeout performed Patient Re-evaluated:Patient Re-evaluated prior to inductionOxygen Delivery Method: Circle system utilized Preoxygenation: Pre-oxygenation with 100% oxygen Intubation Type: IV induction and Rapid sequence Ventilation: Mask ventilation without difficulty Laryngoscope Size: 3 and Glidescope Grade View: Grade I Tube type: Oral Tube size: 7.5 mm Number of attempts: 1 Airway Equipment and Method: Stylet Placement Confirmation: ETT inserted through vocal cords under direct vision,  positive ETCO2 and breath sounds checked- equal and bilateral Secured at: 24 cm Tube secured with: Tape Dental Injury: Teeth and Oropharynx as per pre-operative assessment  Comments: Elective glidescope

## 2015-01-29 NOTE — Anesthesia Postprocedure Evaluation (Signed)
Anesthesia Post Note  Patient: Gary Grimes  Procedure(s) Performed: Procedure(s) (LRB): LAPAROSCOPIC VENTRAL HERNIA (N/A) INSERTION OF MESH (N/A)  Anesthesia type: General  Patient location: PACU  Post pain: Pain level controlled  Post assessment: Post-op Vital signs reviewed  Last Vitals: BP 108/70 mmHg  Pulse 68  Temp(Src) 36.4 C (Oral)  Resp 18  Ht 6' (1.829 m)  Wt 267 lb 4 oz (121.224 kg)  BMI 36.24 kg/m2  SpO2 96%  Post vital signs: Reviewed  Level of consciousness: sedated  Complications: No apparent anesthesia complications

## 2015-01-29 NOTE — Transfer of Care (Signed)
Immediate Anesthesia Transfer of Care Note  Patient: Gary Grimes  Procedure(s) Performed: Procedure(s) (LRB): LAPAROSCOPIC VENTRAL HERNIA (N/A)  Patient Location: PACU  Anesthesia Type: General  Level of Consciousness: sedated, patient cooperative and responds to stimulation  Airway & Oxygen Therapy: Patient Spontanous Breathing and Patient connected to face mask oxgen  Post-op Assessment: Report given to PACU RN and Post -op Vital signs reviewed and stable  Post vital signs: Reviewed and stable  Complications: No apparent anesthesia complications

## 2015-01-29 NOTE — Anesthesia Preprocedure Evaluation (Signed)
Anesthesia Evaluation  Patient identified by MRN, date of birth, ID band Patient awake    Reviewed: Allergy & Precautions, H&P , NPO status , Patient's Chart, lab work & pertinent test results, reviewed documented beta blocker date and time   Airway Mallampati: II  TM Distance: >3 FB Neck ROM: full    Dental   Pulmonary sleep apnea , former smoker,          Cardiovascular hypertension, Pt. on medications + angina     Neuro/Psych PSYCHIATRIC DISORDERS Anxiety Depression  Neuromuscular disease    GI/Hepatic Neg liver ROS, GERD-  Medicated,  Endo/Other  negative endocrine ROS  Renal/GU negative Renal ROS  negative genitourinary   Musculoskeletal  (+) Arthritis -,   Abdominal   Peds  Hematology negative hematology ROS (+)   Anesthesia Other Findings See surgeon's H&P   Reproductive/Obstetrics negative OB ROS                             Anesthesia Physical  Anesthesia Plan  ASA: II  Anesthesia Plan: General   Post-op Pain Management:    Induction: Intravenous  Airway Management Planned: Oral ETT  Additional Equipment:   Intra-op Plan:   Post-operative Plan: Extubation in OR  Informed Consent: I have reviewed the patients History and Physical, chart, labs and discussed the procedure including the risks, benefits and alternatives for the proposed anesthesia with the patient or authorized representative who has indicated his/her understanding and acceptance.   Dental Advisory Given  Plan Discussed with: CRNA  Anesthesia Plan Comments:         Anesthesia Quick Evaluation

## 2015-01-29 NOTE — Op Note (Signed)
Preoperative Diagnosis: ventral hernia  Postoprative Diagnosis: ventral hernia  Procedure: Procedure(s): LAPAROSCOPIC VENTRAL HERNIA   Surgeon: Excell Seltzer T   Assistants: None  Anesthesia:  General endotracheal anesthesia  Indications: Patient is a 54 year old male who is status post open repair of an umbilical hernia with a 6 cm ventral patch approximately 3 years ago. He now presents with a new hernia in the midline several centimeters above the umbilicus. It is enlarging and symptomatic. There is a palpable nonreducible mass. I have recommended proceeding with laparoscopic repair so we can reinforce a larger area with an intra-abdominal piece of mesh. We discussed options and the nature of the surgery and indications and expected recovery as well as risks of anesthetic complications, bleeding, infection, intestinal injury and recurrence. He understands and desires to proceed.  Procedure Detail:  Patient was brought to the operating room, placed in the supine position on the operating table and general endotracheal anesthesia induced. The abdomen was widely sterilely prepped and draped. He received preoperative IV antibiotics. Patient timeout was performed and correct procedure verified. PAS were in place. Access was obtained without difficulty laterally in the left upper quadrant 5 mm Optiview trocar and pneumoperitoneum established. Under direct vision a 5 mm and 11 mm trocar were placed laterally in the left abdomen and left lower quadrant. There was omentum chronically incarcerated up through a defect just superior to the previously placed piece of mesh. Using blunt dissection a fairly large portion of omentum was reduced from the hernia defect which was quite small, 1-2 cm. Some further omental adhesions around the repair were taken down with the Harmonic scalpel and complete hemostasis obtained and the omentum. I chose a 15 x 20 cm Ventralex ST mesh for repair. 8 stay sutures of 0  Novafil were placed circumferentially around the mesh. The mesh was laid over the anterior abdominal wall with the hernia in the center of the mesh and a small corresponding stab incisions were made in the skin about a centimeter wide to the edge of the mesh to allow taut deployment intra-abdominally. The mesh was moistened and rolled and inserted into the abdomen and unrolled and oriented with the gel side towards the viscera. The sutures were then retrieved through each of the stab wounds with the Endo Close device and the mesh brought up to the anterior abdominal wall with nice taut broad deployment in all directions. The mesh was then tacked circumferentially and additionally with an inner ring. Absorbable tacks were used. This appeared to provide very broad coverage of the small defect. There was no bleeding. No evidence of injury or other problems. All CO2 was evacuated and trochars removed. Skin incisions were closed with subcutaneous Monocryl and Dermabond. Sponge needle and instrument counts were correct.    Findings: As above  Estimated Blood Loss:  Minimal         Drains: nnone  Blood Given: none          Specimens: None        Complications:  * No complications entered in OR log *         Disposition: PACU - hemodynamically stable.         Condition: stable

## 2015-01-29 NOTE — Interval H&P Note (Signed)
History and Physical Interval Note:  01/29/2015 1:12 PM  Gary Grimes  has presented today for surgery, with the diagnosis of ventral hernia  The various methods of treatment have been discussed with the patient and family. After consideration of risks, benefits and other options for treatment, the patient has consented to  Procedure(s): LAPAROSCOPIC VENTRAL HERNIA (N/A) as a surgical intervention .  The patient's history has been reviewed, patient examined, no change in status, stable for surgery.  I have reviewed the patient's chart and labs.  Questions were answered to the patient's satisfaction.     [name]

## 2015-01-30 ENCOUNTER — Encounter (HOSPITAL_COMMUNITY): Payer: Self-pay | Admitting: General Surgery

## 2015-01-30 DIAGNOSIS — K436 Other and unspecified ventral hernia with obstruction, without gangrene: Secondary | ICD-10-CM | POA: Diagnosis not present

## 2015-01-30 MED ORDER — OXYCODONE-ACETAMINOPHEN 5-325 MG PO TABS: 1.0000 | ORAL_TABLET | ORAL | Status: DC | PRN

## 2015-01-30 NOTE — Discharge Summary (Signed)
   Patient ID: Gary Grimes [id number] 54 y.o. 1961-01-03  01/29/2015  Discharge date and time: 01/30/2015   Admitting Physician: Excell Seltzer T  Discharge Physician: Excell Seltzer T  Admission Diagnoses: ventral hernia  Discharge Diagnoses: Same  Operations: Procedure(s): LAPAROSCOPIC VENTRAL HERNIA INSERTION OF MESH  Admission Condition: good  Discharged Condition: good  Indication for Admission: Patient has a history of open umbilical hernia repair several years ago with a ventral patch. He now has a new hernia above the umbilicus several centimeters enlarging and symptomatic. After extensive discussion detailed elsewhere he is electively admitted for overnight observation after laparoscopic repair of his hernia  Hospital Course: Patient underwent an uneventful laparoscopic repair of his ventral hernia with a 20 x 15 cm piece of Ventralex light mesh. Postoperatively he did well without complication. The following morning he has expected discomfort but is up and ambulatory, voiding well. Vital signs are normal. Abdomen is soft with appropriate tenderness and wounds are clean and dry he is felt ready for discharge   Disposition: Home  Patient Instructions:    Medication List    TAKE these medications        amitriptyline 25 MG tablet  Commonly known as:  ELAVIL  Take 25 mg by mouth at bedtime as needed for sleep. For sleep     amphetamine-dextroamphetamine 10 MG tablet  Commonly known as:  ADDERALL  Take 10 mg by mouth 3 (three) times daily.     BUDESONIDE NA  Place 1 each into the nose every morning.     escitalopram 20 MG tablet  Commonly known as:  LEXAPRO  Take 20 mg by mouth at bedtime.     losartan 100 MG tablet  Commonly known as:  COZAAR  Take 100 mg by mouth every evening.     oxyCODONE-acetaminophen 5-325 MG per tablet  Commonly known as:  PERCOCET/ROXICET  Take 1-2 tablets by mouth every 4 (four) hours as needed for moderate pain.     traZODone 100 MG tablet  Commonly known as:  DESYREL  Take 100-200 mg by mouth at bedtime.        Activity: no heavy lifting for 4 weeks Diet: regular diet Wound Care: none needed  Follow-up:  With Dr. Excell Seltzer in 3 weeks.  Signed: Edward Jolly MD, FACS  01/30/2015, 8:04 AM

## 2015-01-30 NOTE — Discharge Instructions (Signed)
[facility] _______[facility]  UMBILICAL OR INGUINAL HERNIA REPAIR: POST OP INSTRUCTIONS  Always review your discharge instruction sheet given to you by the facility where your surgery was performed. IF YOU HAVE DISABILITY OR FAMILY LEAVE FORMS, YOU MUST BRING THEM TO THE OFFICE FOR PROCESSING.   DO NOT GIVE THEM TO YOUR DOCTOR.  1. A  prescription for pain medication may be given to you upon discharge.  Take your pain medication as prescribed, if needed.  If narcotic pain medicine is not needed, then you may take acetaminophen (Tylenol) or ibuprofen (Advil) as needed. 2. Take your usually prescribed medications unless otherwise directed. 3. If you need a refill on your pain medication, please contact your pharmacy.  They will contact our office to request authorization. Prescriptions will not be filled after 5 pm or on week-ends. 4. You should follow a light diet the first 24 hours after arrival home, such as soup and crackers, etc.  Be sure to include lots of fluids daily.  Resume your normal diet the day after surgery. 5. Most patients will experience some swelling and bruising around the umbilicus or in the groin and scrotum.  Ice packs and reclining will help.  Swelling and bruising can take several days to resolve.  6. It is common to experience some constipation if taking pain medication after surgery.  Increasing fluid intake and taking a stool softener (such as Colace) will usually help or prevent this problem from occurring.  A mild laxative (Milk of Magnesia or Miralax) should be taken according to package directions if there are no bowel movements after 48 hours. 7. Unless discharge instructions indicate otherwise, you may remove your bandages 24-48 hours after surgery, and you may shower at that time.  You may have steri-strips (small skin tapes) in place directly over the incision.  These strips should be left on the skin for 7-10 days.  If your surgeon used skin glue on the  incision, you may shower in 24 hours.  The glue will flake off over the next 2-3 weeks.  Any sutures or staples will be removed at the office during your follow-up visit. 8. ACTIVITIES:  You may resume regular (light) daily activities beginning the next day--such as daily self-care, walking, climbing stairs--gradually increasing activities as tolerated.  You may have sexual intercourse when it is comfortable.  Refrain from any heavy lifting or straining until approved by your doctor. a. You may drive when you are no longer taking prescription pain medication, you can comfortably wear a seatbelt, and you can safely maneuver your car and apply brakes. b. RETURN TO WORK:  __________________________________________________________ 9. You should see your doctor in the office for a follow-up appointment approximately 2-3 weeks after your surgery.  Make sure that you call for this appointment within a day or two after you arrive home to insure a convenient appointment time. 10. OTHER INSTRUCTIONS:  __________________________________________________________________________________________________________________________________________________________________________________________  WHEN TO CALL YOUR DOCTOR: 1. Fever over 101.0 2. Inability to urinate 3. Nausea and/or vomiting 4. Extreme swelling or bruising 5. Continued bleeding from incision. 6. Increased pain, redness, or drainage from the incision  The clinic staff is available to answer your questions during regular business hours.  Please don't hesitate to call and ask to speak to one of the nurses for clinical concerns.  If you have a medical emergency, go to the nearest emergency room or call 911.  A surgeon from [facility] is always on call at the hospital     [address], [address], [place], [place]  [address] ?  [address] [address], [place], [place]   [address] [phone] ? [phone] ? FAX [phone] Web site:  [email or web address]  

## 2015-01-30 NOTE — Progress Notes (Signed)
UR completed 

## 2015-01-30 NOTE — Progress Notes (Signed)
Discharge instructions given along with prescription.  Questions answered 

## 2015-12-26 ENCOUNTER — Encounter (HOSPITAL_COMMUNITY): Payer: Self-pay

## 2015-12-26 ENCOUNTER — Emergency Department (HOSPITAL_COMMUNITY)
Admission: EM | Admit: 2015-12-26 | Discharge: 2015-12-27 | Disposition: A | Discharge status: Other Acute Inpatient Hospital | Payer: BLUE CROSS/BLUE SHIELD | Attending: Emergency Medicine | Admitting: Emergency Medicine

## 2015-12-26 DIAGNOSIS — Z79899 Other long term (current) drug therapy: Secondary | ICD-10-CM | POA: Insufficient documentation

## 2015-12-26 DIAGNOSIS — F329 Major depressive disorder, single episode, unspecified: Secondary | ICD-10-CM | POA: Insufficient documentation

## 2015-12-26 DIAGNOSIS — Z8669 Personal history of other diseases of the nervous system and sense organs: Secondary | ICD-10-CM | POA: Insufficient documentation

## 2015-12-26 DIAGNOSIS — Z87891 Personal history of nicotine dependence: Secondary | ICD-10-CM | POA: Insufficient documentation

## 2015-12-26 DIAGNOSIS — Z8719 Personal history of other diseases of the digestive system: Secondary | ICD-10-CM | POA: Insufficient documentation

## 2015-12-26 DIAGNOSIS — Z8639 Personal history of other endocrine, nutritional and metabolic disease: Secondary | ICD-10-CM | POA: Insufficient documentation

## 2015-12-26 DIAGNOSIS — M1990 Unspecified osteoarthritis, unspecified site: Secondary | ICD-10-CM | POA: Insufficient documentation

## 2015-12-26 DIAGNOSIS — R45851 Suicidal ideations: Secondary | ICD-10-CM

## 2015-12-26 DIAGNOSIS — F419 Anxiety disorder, unspecified: Secondary | ICD-10-CM | POA: Insufficient documentation

## 2015-12-26 DIAGNOSIS — F32A Depression, unspecified: Secondary | ICD-10-CM

## 2015-12-26 DIAGNOSIS — I10 Essential (primary) hypertension: Secondary | ICD-10-CM | POA: Insufficient documentation

## 2015-12-26 HISTORY — DX: Panic disorder (episodic paroxysmal anxiety): F41.0

## 2015-12-26 LAB — COMPREHENSIVE METABOLIC PANEL
ALT: 23 U/L (ref 17–63)
AST: 34 U/L (ref 15–41)
Albumin: 4.4 g/dL (ref 3.5–5.0)
Alkaline Phosphatase: 74 U/L (ref 38–126)
Anion gap: 10 (ref 5–15)
BUN: 12 mg/dL (ref 6–20)
CHLORIDE: 102 mmol/L (ref 101–111)
CO2: 27 mmol/L (ref 22–32)
CREATININE: 1.24 mg/dL (ref 0.61–1.24)
Calcium: 9.8 mg/dL (ref 8.9–10.3)
GFR calc non Af Amer: >60 mL/min (ref >60)
Glucose, Bld: 111 mg/dL — ABNORMAL HIGH (ref 65–99)
Potassium: 4.4 mmol/L (ref 3.5–5.1)
SODIUM: 139 mmol/L (ref 135–145)
Total Bilirubin: 1.2 mg/dL (ref 0.3–1.2)
Total Protein: 7 g/dL (ref 6.5–8.1)

## 2015-12-26 LAB — CBC
HCT: 46.4 % (ref 39.0–52.0)
HEMOGLOBIN: 16.1 g/dL (ref 13.0–17.0)
MCH: 30.3 pg (ref 26.0–34.0)
MCHC: 34.7 g/dL (ref 30.0–36.0)
MCV: 87.4 fL (ref 78.0–100.0)
Platelets: 274 10*3/uL (ref 150–400)
RBC: 5.31 MIL/uL (ref 4.22–5.81)
RDW: 13.5 % (ref 11.5–15.5)
WBC: 8.5 10*3/uL (ref 4.0–10.5)

## 2015-12-26 LAB — RAPID URINE DRUG SCREEN, HOSP PERFORMED
AMPHETAMINES: NOT DETECTED
BENZODIAZEPINES: NOT DETECTED
Barbiturates: NOT DETECTED
COCAINE: NOT DETECTED
OPIATES: NOT DETECTED
TETRAHYDROCANNABINOL: NOT DETECTED

## 2015-12-26 LAB — ETHANOL: Alcohol, Ethyl (B): <5 mg/dL (ref <5)

## 2015-12-26 LAB — ACETAMINOPHEN LEVEL: Acetaminophen (Tylenol), Serum: <10 ug/mL — ABNORMAL LOW (ref 10–30)

## 2015-12-26 LAB — SALICYLATE LEVEL

## 2015-12-26 MED ORDER — NICOTINE 21 MG/24HR TD PT24
21.0000 mg | MEDICATED_PATCH | Freq: Every day | TRANSDERMAL | Status: DC
  Filled 2015-12-26: qty 1

## 2015-12-26 MED ORDER — ACETAMINOPHEN 325 MG PO TABS: 650.0000 mg | ORAL_TABLET | ORAL | Status: DC | PRN

## 2015-12-26 MED ORDER — LORAZEPAM 1 MG PO TABS
1.0000 mg | ORAL_TABLET | Freq: Three times a day (TID) | ORAL | Status: DC | PRN
  Administered 2015-12-27: 1 mg via ORAL
  Filled 2015-12-26: qty 1

## 2015-12-26 MED ORDER — QUETIAPINE FUMARATE 25 MG PO TABS
300.0000 mg | ORAL_TABLET | Freq: Every day | ORAL | Status: DC
  Administered 2015-12-27: 300 mg via ORAL
  Filled 2015-12-26: qty 4

## 2015-12-26 MED ORDER — ONDANSETRON HCL 4 MG PO TABS: 4.0000 mg | ORAL_TABLET | Freq: Three times a day (TID) | ORAL | Status: DC | PRN

## 2015-12-26 MED ORDER — LOSARTAN POTASSIUM 50 MG PO TABS
100.0000 mg | ORAL_TABLET | Freq: Every evening | ORAL | Status: DC
  Administered 2015-12-27: 100 mg via ORAL
  Filled 2015-12-26 (×2): qty 2

## 2015-12-26 NOTE — ED Notes (Signed)
Pt has been wanded by security.

## 2015-12-26 NOTE — ED Notes (Signed)
Sitter at the bedside at this time.

## 2015-12-26 NOTE — ED Notes (Signed)
Pt here because he has been depressed for a while. He is widowed and under a lot of financial stress. Has filed for bankruptcy and having trouble paying that. States he feels so hopeless and helpless. Has 3 sons that live here and the oldest knows about the financial situation. He states he doesn't want to die but he doesn't want to live either. Pt is very tearful in triage. Pt says his plan would be to either walk out in traffic or hang himself. States his wife died 16 years ago.

## 2015-12-26 NOTE — ED Provider Notes (Signed)
CSN: [id number]     Arrival date & time 12/26/15  1901 History   First MD Initiated Contact with Patient 12/26/15 2203-04-11     Chief Complaint  Patient presents with  . Suicidal  . Depression     (Consider location/radiation/quality/duration/timing/severity/associated sxs/prior Treatment) HPI Comments: Patient is a 54 year old male with a history of hypertension, depression, sleep apnea, and anxiety. He presents to the emergency department for worsening depression and suicidal ideation. He states that his symptoms have been worsening over the past 2 months, but mostly over the past few days. He reports suicidal ideation with a plan to walk into traffic or hang himself. He reports a history of behavioral health hospitalization in 04/10/05 after his wife passed away. He was in treatment for approximately 23 weeks. He states that he is under a lot of financial stress and has recently filed for bankruptcy. He expresses feelings of helplessness and hopelessness. He states he has little desire to live anymore. No reported alcohol or illicit drug use. He states that he has been compliant with his citalopram and Seroquel. No reported alcohol or illicit drug use. No homicidal thoughts.  Patient is a 54 y.o. male presenting with depression. The history is provided by the patient. No language interpreter was used.  Depression    Past Medical History  Diagnosis Date  . Hypertension   . 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 Perirectal abscess   . High triglycerides   . High cholesterol   . Depression     did get seen in er 4/13 for evaluation-psyc  . Arthritis   . Anginal pain ([place])     ER visit 01/14/2014 visit on chart   . Sleep apnea     pt has CPAP machine but does not use due to mask   . Anxiety     pt denies  . GERD (gastroesophageal reflux disease)    Past Surgical History  Procedure Laterality Date  . Appendectomy  Apr 11, 1983  . Anal fissure repair  08/05/2000    proctoscopy  . Irrigation and  debridement abscess  02/18/2012    peri-rectal  . Umbilical hernia repair  10/27/2010  . Shoulder arthroscopy w/ labral repair  08/08/2007    left  . Colonscopy     . Ventral hernia repair N/A 01/29/2015    Procedure: LAPAROSCOPIC VENTRAL HERNIA;  Surgeon: Excell Seltzer, MD;  Location: [facility];  Service: General;  Laterality: N/A;  . Insertion of mesh N/A 01/29/2015    Procedure: INSERTION OF MESH;  Surgeon: Excell Seltzer, MD;  Location: [facility];  Service: General;  Laterality: N/A;   Family History  Problem Relation Age of Onset  . Cancer Mother     breast and ovarian   Social History  Substance Use Topics  . Smoking status: Former Smoker -- 0.50 packs/day for 1 years    Types: Cigarettes    Quit date: 07/06/1977  . Smokeless tobacco: Never Used  . 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  All other systems reviewed and are negative.   Allergies  Review of patient's allergies indicates no known allergies.  Home Medications   Prior to Admission medications   Medication Sig Start Date End Date Taking? Authorizing Provider  CITALOPRAM HYDROBROMIDE PO Take 1 tablet by mouth every evening.   Yes  Historical Provider, MD  losartan (COZAAR) 100 MG tablet Take 100 mg by mouth every evening.   Yes Historical Provider, MD  QUEtiapine (SEROQUEL) 300 MG tablet Take 300 mg by mouth at bedtime.   Yes Historical Provider, MD  oxyCODONE-acetaminophen (PERCOCET/ROXICET) 5-325 MG per tablet Take 1-2 tablets by mouth every 4 (four) hours as needed for moderate pain. Patient not taking: Reported on 12/26/2015 01/30/15   Excell Seltzer, MD   BP 138/102 mmHg  Pulse 72  Temp(Src) 98.5 F (36.9 C) (Oral)  Resp 22  Ht 6' (1.829 m)  Wt 122.471 kg  BMI 36.61 kg/m2  SpO2 97%   Physical Exam  Constitutional: He is oriented  to person, place, and time. He appears well-developed and well-nourished. No distress.  HENT:  Head: Normocephalic and atraumatic.  Eyes: Conjunctivae and EOM are normal. No scleral icterus.  Neck: Normal range of motion.  Pulmonary/Chest: Effort normal. No respiratory distress.  Musculoskeletal: Normal range of motion.  Neurological: He is alert and oriented to person, place, and time. He exhibits normal muscle tone. Coordination normal.  Skin: Skin is warm and dry. No rash noted. He is not diaphoretic. No erythema. No pallor.  Psychiatric: His speech is normal and behavior is normal. His mood appears anxious. He exhibits a depressed mood. He expresses suicidal ideation. He expresses no homicidal ideation. He expresses suicidal plans. He expresses no homicidal plans.  Nursing note and vitals reviewed.   ED Course  Procedures (including critical care time) Labs Review Labs Reviewed  COMPREHENSIVE METABOLIC PANEL - Abnormal; Notable for the following:    Glucose, Bld 111 (*)    All other components within normal limits  ACETAMINOPHEN LEVEL - Abnormal; Notable for the following:    Acetaminophen (Tylenol), Serum <10 (*)    All other components within normal limits  ETHANOL  SALICYLATE LEVEL  CBC  URINE RAPID DRUG SCREEN, HOSP PERFORMED    Imaging Review No results found.   I have personally reviewed and evaluated these images and lab results as part of my medical decision-making.   EKG Interpretation None      MDM   Final diagnoses:  Depression with suicidal ideation    Patient medically cleared. He is pending TTS evaluation. Anticipate inpatient placement for further management of the patient's depression and SI.    Antonietta Breach, PA-C 12/27/15 0006  Courteney Julio Alm, MD 12/27/15 [phone]
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
## 2015-12-26 NOTE — ED Notes (Signed)
Staffing called for a sitter 

## 2015-12-26 NOTE — ED Notes (Signed)
Security called to come wand pt  

## 2015-12-27 ENCOUNTER — Encounter (HOSPITAL_COMMUNITY): Payer: Self-pay | Admitting: *Deleted

## 2015-12-27 ENCOUNTER — Inpatient Hospital Stay (HOSPITAL_COMMUNITY)
Admission: AD | Admit: 2015-12-27 | Discharge: 2016-01-08 | DRG: 885 | Disposition: A | Discharge status: Home / Self Care | Payer: Federal, State, Local not specified - Other | Source: Intra-hospital | Attending: Psychiatry | Admitting: Psychiatry

## 2015-12-27 ENCOUNTER — Encounter (HOSPITAL_COMMUNITY): Payer: Self-pay | Admitting: Emergency Medicine

## 2015-12-27 DIAGNOSIS — R45851 Suicidal ideations: Secondary | ICD-10-CM | POA: Diagnosis not present

## 2015-12-27 DIAGNOSIS — Z87891 Personal history of nicotine dependence: Secondary | ICD-10-CM | POA: Diagnosis not present

## 2015-12-27 DIAGNOSIS — I10 Essential (primary) hypertension: Secondary | ICD-10-CM | POA: Diagnosis present

## 2015-12-27 DIAGNOSIS — F329 Major depressive disorder, single episode, unspecified: Secondary | ICD-10-CM | POA: Diagnosis present

## 2015-12-27 DIAGNOSIS — F332 Major depressive disorder, recurrent severe without psychotic features: Principal | ICD-10-CM | POA: Diagnosis present

## 2015-12-27 HISTORY — DX: Major depressive disorder, recurrent severe without psychotic features: F33.2

## 2015-12-27 MED ORDER — QUETIAPINE FUMARATE 300 MG PO TABS
300.0000 mg | ORAL_TABLET | Freq: Every day | ORAL | Status: DC
  Administered 2015-12-27 – 2015-12-30 (×4): 300 mg via ORAL
  Filled 2015-12-27 (×8): qty 1

## 2015-12-27 MED ORDER — ALUM & MAG HYDROXIDE-SIMETH 200-200-20 MG/5ML PO SUSP: 30.0000 mL | ORAL | Status: DC | PRN

## 2015-12-27 MED ORDER — LORAZEPAM 1 MG PO TABS
1.0000 mg | ORAL_TABLET | Freq: Three times a day (TID) | ORAL | Status: DC | PRN
  Administered 2015-12-27 – 2015-12-28 (×2): 1 mg via ORAL
  Filled 2015-12-27: qty 1

## 2015-12-27 MED ORDER — HYDROXYZINE HCL 25 MG PO TABS
25.0000 mg | ORAL_TABLET | Freq: Four times a day (QID) | ORAL | Status: DC | PRN
  Administered 2015-12-27 – 2016-01-06 (×11): 25 mg via ORAL
  Filled 2015-12-27 (×11): qty 1

## 2015-12-27 MED ORDER — LOSARTAN POTASSIUM 50 MG PO TABS
100.0000 mg | ORAL_TABLET | Freq: Every evening | ORAL | Status: DC
  Administered 2015-12-27 – 2016-01-07 (×13): 100 mg via ORAL
  Filled 2015-12-27: qty 2
  Filled 2015-12-27: qty 14
  Filled 2015-12-27 (×10): qty 2
  Filled 2015-12-27: qty 14
  Filled 2015-12-27: qty 2
  Filled 2015-12-27: qty 14
  Filled 2015-12-27 (×4): qty 2

## 2015-12-27 MED ORDER — CITALOPRAM HYDROBROMIDE 20 MG PO TABS
20.0000 mg | ORAL_TABLET | Freq: Every day | ORAL | Status: DC
  Administered 2015-12-27 – 2015-12-28 (×2): 20 mg via ORAL
  Filled 2015-12-27 (×5): qty 1

## 2015-12-27 MED ORDER — MAGNESIUM HYDROXIDE 400 MG/5ML PO SUSP: 30.0000 mL | Freq: Every day | ORAL | Status: DC | PRN

## 2015-12-27 MED ORDER — ACETAMINOPHEN 325 MG PO TABS
650.0000 mg | ORAL_TABLET | Freq: Four times a day (QID) | ORAL | Status: DC | PRN
  Administered 2016-01-01 – 2016-01-05 (×2): 650 mg via ORAL
  Filled 2015-12-27 (×2): qty 2

## 2015-12-27 MED ORDER — INFLUENZA VAC SPLIT QUAD 0.5 ML IM SUSY
0.5000 mL | PREFILLED_SYRINGE | INTRAMUSCULAR | Status: AC
  Administered 2015-12-29: 0.5 mL via INTRAMUSCULAR
  Filled 2015-12-27: qty 0.5

## 2015-12-27 MED ORDER — CITALOPRAM HYDROBROMIDE 10 MG PO TABS: 10.0000 mg | ORAL_TABLET | Freq: Every day | ORAL | Status: DC

## 2015-12-27 MED ORDER — LORAZEPAM 1 MG PO TABS
ORAL_TABLET | ORAL | Status: AC
  Filled 2015-12-27: qty 1

## 2015-12-27 NOTE — ED Notes (Signed)
Pt sleeping soundly.

## 2015-12-27 NOTE — Progress Notes (Signed)
CSW engaged with Patient at his bedside. CSW provided supportive counseling and emotional support. Patient reports feeling that his life is not worth living due to his current financial situation. Patient reports that he has filed bankrupty and fearful that he is going to lose his house and his car due to his inability to make the monthly payments. Patient reports that his suicidal ideation started within the last couple of days, particularly triggered by the holidays. Patient reports having 2 of his 3 sons as supports as well as his church family, grandchildren, and his girlfriend. 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 Patient reports an estranged relationship with his youngest son. Patient identified that his pride prevents him from asking his family for help. CSW encouraged patient to utilize positive reframing that consists of identifying and then disputing irrational or maladaptive thoughts. CSW and patient discussed with effects suicide would have on his family and supports. Patient agreed that he still has much to live for. Patient also agreed to focus on getting himself better at this time Patient reports that his biggest concern is the thought of being homeless and without transportation. CSW provided resources to the [employer], particularly information regarding their homelessness prevention program and their foreclosure prevention program. CSW also provided resources for the [employer]. Patient was very appreciative of CSW's assistance. CSW signing off at this time. Please contact if new need(s) arise.    Holly Bodily, [place]

## 2015-12-27 NOTE — Progress Notes (Signed)
Patient accepted to [facility]. Room 405-1.  Bed should be available this afternoon. Clayborne Dana, RN

## 2015-12-27 NOTE — ED Notes (Signed)
Pt is crying hysterically -- states "I just can't stop thinking about things, things are just going round and round, constantly thinking." Gary Grimes, [place] notified-- will talk with pt.

## 2015-12-27 NOTE — Progress Notes (Signed)
This Probation officer spoke with Levada Dy, RN to inform her of the accepting [facility] information to bed 405-1 and attending is Dr. Parke Poisson.  Call report to [phone].  Per Claiborne Billings, [facility] the patient can transfer now.      Chesley Noon, MSW, Darlyn Read [facility] Triage Specialist [phone]

## 2015-12-27 NOTE — Progress Notes (Signed)
D: Patient met lying in bed awake with his visitors. Patient stated he is not feeling all right. " I am just tired and weak" Appear depressed and sad. Endorses anxiety, depression and feelings of hopelessness. Denies pain, SI, AH/VH at this time. Patient stated all he wants do is to sleep.  A: Patient encouraged to participate in the group tonight. Offered support as needed. Due/PRN medications given as ordered. Every 15 minutes check for safety maintained. Will continue to monitor patient for safety and stability.  R: Patient verbalizes concerns and was receptive to nursing interventions.

## 2015-12-27 NOTE — ED Notes (Signed)
Report given to patty, rn at [facility].

## 2015-12-27 NOTE — ED Notes (Signed)
Patient was given a snack and drink. A regular diet order ordered with patient.

## 2015-12-27 NOTE — Tx Team (Signed)
Initial Interdisciplinary Treatment Plan   PATIENT STRESSORS: Educational concerns Financial difficulties Health problems Legal issue   PATIENT STRENGTHS: Ability for insight Active sense of humor Average or above average intelligence Capable of independent living Communication skills   PROBLEM LIST: Problem List/Patient Goals Date to be addressed Date deferred Reason deferred Estimated date of resolution  Depression with suicidality  12/27/2015     Anxiety 12/27/2015                 " Im tired of feeling like this" 12/27/2015     " Im o tired" 12/27/2015                        DISCHARGE CRITERIA:  Ability to meet basic life and health needs Adequate post-discharge living arrangements Improved stabilization in mood, thinking, and/or behavior Medical problems require only outpatient monitoring Motivation to continue treatment in a less acute level of care  PRELIMINARY DISCHARGE PLAN: Attend aftercare/continuing care group Attend PHP/IOP Outpatient therapy Participate in family therapy  PATIENT/FAMIILY INVOLVEMENT: This treatment plan has been presented to and reviewed with the patient, Gary Grimes, and/or family member, .  The patient and family have been given the opportunity to ask questions and make suggestions.  [name] 12/27/2015, 6:20 PM

## 2015-12-27 NOTE — ED Provider Notes (Signed)
Patient has been accepted to [facility] by Dr. Parke Poisson.  BP 104/48 mmHg  Pulse 61  Temp(Src) 98.8 F (37.1 C) (Oral)  Resp 18  Ht 6' (1.829 m)  Wt 270 lb (122.471 kg)  BMI 36.61 kg/m2  SpO2 97%   Ezequiel Essex, MD 12/27/15 1330

## 2015-12-27 NOTE — Progress Notes (Signed)
Adult Psychoeducational Group Note  Date:  12/27/2015 Time:  9:12 PM  Group Topic/Focus:  Wrap-Up Group:   The focus of this group is to help patients review their daily goal of treatment and discuss progress on daily workbooks.  Participation Level:  Active  Participation Quality:  Appropriate  Affect:  Appropriate  Cognitive:  Alert and Appropriate  Insight: Appropriate  Engagement in Group:  Engaged  Modes of Intervention:  Discussion  Additional Comments:  Patient stated having a rough day. Patient stated he was happy to see is brother and sister in law today.  Alane Hanssen L Tylee Yum 12/27/2015, 9:12 PM

## 2015-12-28 ENCOUNTER — Encounter (HOSPITAL_COMMUNITY): Payer: Self-pay | Admitting: Psychiatry

## 2015-12-28 MED ORDER — LORAZEPAM 1 MG PO TABS
ORAL_TABLET | ORAL | Status: AC
  Administered 2015-12-28: 14:00:00
  Filled 2015-12-28: qty 1

## 2015-12-28 MED ORDER — LORAZEPAM 1 MG PO TABS
1.0000 mg | ORAL_TABLET | ORAL | Status: DC | PRN
  Administered 2015-12-28 – 2015-12-29 (×4): 1 mg via ORAL
  Filled 2015-12-28 (×3): qty 1

## 2015-12-28 MED ORDER — CITALOPRAM HYDROBROMIDE 20 MG PO TABS
30.0000 mg | ORAL_TABLET | Freq: Every day | ORAL | Status: DC
  Administered 2015-12-29 – 2015-12-31 (×3): 30 mg via ORAL
  Filled 2015-12-28 (×5): qty 1

## 2015-12-28 NOTE — [facility] Suicide Risk Assessment (Signed)
[facility] Admission Suicide Risk Assessment   Nursing information obtained from:  Patient Demographic factors:  Male, Divorced or widowed, Caucasian, Low socioeconomic status, Living alone, Unemployed Current Mental Status:  Suicidal ideation indicated by patient Loss Factors:  Decrease in vocational status, Loss of significant relationship Historical Factors:  Prior suicide attempts, Family history of mental illness or substance abuse, Anniversary of important loss, Impulsivity Risk Reduction Factors:  Sense of responsibility to family, Positive social support, Positive therapeutic relationship Total Time spent with patient: 45 minutes Principal Problem: <principal problem not specified> Diagnosis:   Patient Active Problem List   Diagnosis Date Noted  . MDD (major depressive disorder), recurrent severe, without psychosis ([place]) [F33.2] 12/27/2015  . Ventral incisional hernia [K43.2] 01/29/2015  . Perirectal abscess [K61.1] 02/18/2012  . Ventral hernia [K43.9] 07/23/2011  . SPONDYLOSIS [M47.9] 09/19/2008  . CERVICALGIA [M54.2] 09/19/2008  . LOW BACK PAIN [M54.5] 09/19/2008     Continued Clinical Symptoms:  Alcohol Use Disorder Identification Test Final Score (AUDIT): 0 The "Alcohol Use Disorders Identification Test", Guidelines for Use in Primary Care, Second Edition.  World Pharmacologist [facility]). Score between 0-7:  no or low risk or alcohol related problems. Score between 8-15:  moderate risk of alcohol related problems. Score between 16-19:  high risk of alcohol related problems. Score 20 or above:  warrants further diagnostic evaluation for alcohol dependence and treatment.   CLINICAL FACTORS:   Depression:   Severe   Musculoskeletal: Strength & Muscle Tone: within normal limits Gait & Station: normal Patient leans: normal  Psychiatric Specialty Exam: Physical Exam  Review of Systems  Constitutional: Positive for malaise/fatigue.  HENT: Negative.   Eyes: Negative.    Respiratory: Negative.   Cardiovascular: Negative.   Gastrointestinal: Positive for heartburn and nausea.  Genitourinary: Negative.   Musculoskeletal: Positive for back pain.  Skin: Negative.   Neurological: Positive for weakness.  Endo/Heme/Allergies: Negative.   Psychiatric/Behavioral: Positive for depression and suicidal ideas. The patient is nervous/anxious and has insomnia.     Blood pressure 118/65, pulse 72, temperature 98.1 F (36.7 C), temperature source Oral, resp. rate 18, height 6' (1.829 m), weight 122.471 kg (270 lb), SpO2 100 %.Body mass index is 36.61 kg/(m^2).  General Appearance: Disheveled  Eye Sport and exercise psychologist::  Fair  Speech:  Clear and Coherent  Volume:  fluctuates  Mood:  Depressed and Dysphoric  Affect:  Depressed and Tearful  Thought Process:  Coherent and Goal Directed  Orientation:  Full (Time, Place, and Person)  Thought Content:  symptoms events worries concerns  Suicidal Thoughts:  Yes.  with intent/plan, can contract for safety  Homicidal Thoughts:  No  Memory:  Immediate;   Fair Recent;   Fair Remote;   Fair  Judgement:  Fair  Insight:  Present  Psychomotor Activity:  Restlessness  Concentration:  Fair  Recall:  [employer] of Knowledge:Fair  Language: Fair  Akathisia:  No  Handed:  Right  AIMS (if indicated):     Assets:  Desire for Improvement  Sleep:  Number of Hours: 6.75  Cognition: WNL  ADL's:  Intact     COGNITIVE FEATURES THAT CONTRIBUTE TO RISK:  Closed-mindedness, Polarized thinking and Thought constriction (tunnel vision)    SUICIDE RISK:   Moderate:  Frequent suicidal ideation with limited intensity, and duration, some specificity in terms of plans, no associated intent, good self-control, limited dysphoria/symptomatology, some risk factors present, and identifiable protective factors, including available and accessible social support. 54 Y/o male who has gotten into financial difficulties  after losing his job at the end of  September. He has declared bankruptcy and is close to losing his house and his car. Got very depressed suicidal. He is in training with [employer] but he has not been cleared to start working so he does not have any income no savings no retirement accounts. To make things worst he just found out from a friend that his GF of 7 years just broke up with him. He states that they live side by side. States he had been struggling for year and a half. Keeps applying to jobs, goes to interviews he is not chosen what increases his depression. He has past history of depression. He did well on Lexapro and Abilify but could not afford to stay on Abilify.  Has 3 grown up sons 82 27 59. Has been on citalopram for 6 weeks and he had been taking samples of Seroquel.  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 PLAN OF CARE: Supportive approach/coping skills Depression; will increase the Celexa to 30 mg daily Will continue to augment with Seroquel at is seems to be helping the anxiety the ruminations Will use Ativan to help with the acute anxiety Will work with CBT/mindfulness/help explore other options He is contracting for safety Medical Decision Making:  Review of Psycho-Social Stressors (1), Review or order clinical lab tests (1), Review of Medication Regimen & Side Effects (2) and Review of New Medication or Change in Dosage (2)  I certify that inpatient services furnished can reasonably be expected to improve the patient's condition.   Gary Grimes A 12/28/2015, 1:32 PM
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
## 2015-12-28 NOTE — [facility] Group Notes (Signed)
Adult Psychoeducational Group Note  Date:  12/28/2015 Time:  9:36 PM  Group Topic/Focus:  Wrap-Up Group:   The focus of this group is to help patients review their daily goal of treatment and discuss progress on daily workbooks.  Participation Level:  Minimal  Participation Quality:  Appropriate  Affect:  Flat  Cognitive:  Appropriate  Insight: Good  Engagement in Group:  Limited  Modes of Intervention:  Discussion  Additional Comments:  Patient stated his day was better than yesterday.  He stated he is getting acclimated to the unit and that he is fighting his depression.  He has no discharge plans yet.  His goal for the new year is for his life to come together and his depression to be beaten.  Gary Grimes A 12/28/2015, 9:36 PM
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
## 2015-12-28 NOTE — BHH Group Notes (Signed)
[facility] Group Notes:  (Nursing/MHT/Case Management/Adjunct)  Date:  12/28/2015  Time:  2:29 PM  Type of Therapy:  Psychoeducational Skills  Participation Level:  Active  Participation Quality:  Appropriate  Affect:  Appropriate  Cognitive:  Appropriate  Insight:  Appropriate  Engagement in Group:  Engaged  Modes of Intervention:  Discussion  Summary of Progress/Problems: Pt did attend self inventory group.   Benancio Deeds Shanta 12/28/2015, 2:29 PM

## 2015-12-28 NOTE — BHH Group Notes (Signed)
[facility] Group Notes:  (Clinical Social Work)   12/28/2015 10:00-11:00AM  Summary of Progress/Problems: In today's process group a decisional balance exercise was used to explore in depth the perceived benefits and costs of unhealthy coping techniques, as well as the benefits and costs of replacing these with healthy coping skills. Among the unhealthy coping techniques listed by the group were drinking, isolating, overtaking pain medications, directing anger inward, trying to control everything, stopping medications, denying grief, rumination, and anger outbursts. Motivational Interviewing and the whiteboard were utilized for the exercises. The patient stated that he needs to learn how to avoid isolation, stating that it leads to depression and to a hospitalization such as this one.  He was called out of the room by a practitioner about 20 minutes after group started.  Type of Therapy:  Group Therapy - Process   Participation Level:  Active  Participation Quality:  Attentive  Affect:  Depressed and Flat  Cognitive:  Appropriate  Insight:  Developing/Improving  Engagement in Therapy:  Engaged  Modes of Intervention:  Education, Motivational Interviewing  Selmer Dominion, LCSW 12/28/2015, 12:33 PM

## 2015-12-28 NOTE — Progress Notes (Signed)
D Feliberto is  having a   diff time tolerating his high levels  Of anxiety  . He remains stuck on the fact that his GF broke up with him and that " when I go home.Marland Kitchenibuprofen'll be all alone". He cries throughout the day...periodically and  He has gotten so emotional that his prn ativan was upped to  q 4 hrs.    He completed his daily assessment and on it he wrote he denied SI and he rated his depression, hopelessness and anxiety 8/8/9.   R Safety in place.

## 2015-12-28 NOTE — Clinical Social Work Note (Signed)
Clinical Social Work Note  CSW was informed by Therapist, sports that pt had just received notice via telephone that his girlfriend of 8 years was breaking up with him.  Spent about 15 minutes processing with him and getting him to agree to try to go to lunch with his peers.  He stated girlfriend said she "can't handle my depression anymore."  He repeatedly said "I want to die, I want to die" interspersed with "I shouldn't have come here and we'd still be together."  CSW pointed out that his suicidal plan was very specific and available to him, so his survival may well have depended on him coming to the hospital, and he was able to verbalize agreement.  He was able to calm down and verbalize also that he feels safe in the hospital and will not act on suicidal thoughts but will go to staff with such.  Selmer Dominion, LCSW 12/28/2015, 12:51 PM
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
## 2015-12-29 DIAGNOSIS — R45851 Suicidal ideations: Secondary | ICD-10-CM

## 2015-12-29 DIAGNOSIS — F332 Major depressive disorder, recurrent severe without psychotic features: Principal | ICD-10-CM

## 2015-12-29 MED ORDER — LORAZEPAM 0.5 MG PO TABS
0.5000 mg | ORAL_TABLET | Freq: Four times a day (QID) | ORAL | Status: DC | PRN
  Administered 2015-12-29 – 2015-12-31 (×6): 0.5 mg via ORAL
  Filled 2015-12-29 (×7): qty 1

## 2015-12-29 NOTE — Plan of Care (Signed)
Problem: Consults Goal: Anxiety Disorder Patient Education See Patient Education Module for eduction specifics.  Outcome: Progressing Nurse discussed anxiety/coping skills with patient.        

## 2015-12-29 NOTE — Progress Notes (Addendum)
D:  Patient's self inventory sheet, patient sleeps good, sleep medication is helpful.  Good appetite, low energy level, poor concentration.  Rated depression 8, anxiety 9.  Denied withdrawals.  SI, contracts for safety.  Denied physical problems.  Denied pain.  Goal is "to have peace".  Plans to "try to surrender".  No discharge plans. A:  Medications administered per MD orders.  Emotional support and encouragement. R:  SI, contracts for safety.  Denied HI.  Denied A/V hallucinations.  Safety maintained with 15 minute checks.  Patient tearful this afternoon, stated he has financial problems, fears he is all alone, no one to help him through his personal problems.  Also having problems with his girlfriend.  Patient has been cooperative throughout day.  Has been sitting in dayroom with peers to be around others and watch tv.  Emotional support and encouragement given patient.  Safety maintained with 15 minute checks.

## 2015-12-29 NOTE — Progress Notes (Signed)
D Pt. Denies SI and HI, no complaints of pain or discomfort noted at present time.    A Writer offered support and encouragement, discussed coping skills with pt.  R Pt.  Remains safe on the unit,  Rates his day a 6, his depression an 8 and his anxiety an 8,  Pt. States his learned coping skills at present is to learn to be in the moment instead of stressing about the future.

## 2015-12-29 NOTE — Progress Notes (Signed)
Writer has observed patient up in the dayroom watching tv with minimal interaction with peers. Writer spoke with him and he reports that he is trying to keep his emotions together and work on his depression. Support given and safety maintained on unit with 15 min checks.

## 2015-12-29 NOTE — BHH Group Notes (Signed)
[facility] Group Notes:  (Clinical Social Work)   12/29/2015    1:15-2:15PM  Summary of Progress/Problems:   The main focus of today's process group was to   1)  Discuss any resolutions or goals for the new year  2)  Identify current unhealthy supports and discuss how to set limits with them  3)  Discuss the importance of adding supports  An emphasis was placed on using counselor, doctor, therapy groups, and problem-specific support groups to expand supports.    The patient expressed full comprehension of the concepts presented, and agreed that there is a need to add more supports.  The patient stated that today has been a pretty hard day for him, as he has had a lot to deal with.  He stated he wants to work on "trusting the process."  He spoke little otherwise, and toward the end of group, left stating that his stomach was upset.  Type of Therapy:  Process Group with Motivational Interviewing  Participation Level:  Active  Participation Quality:  Attentive  Affect:  Blunted and Depressed  Cognitive:  Oriented  Insight:  Developing/Improving  Engagement in Therapy:  Engaged  Modes of Intervention:   Education, Support and Processing, Activity  Selmer Dominion, LCSW 12/29/2015    3:19 PM

## 2015-12-29 NOTE — Plan of Care (Signed)
Problem: Diagnosis: Increased Risk For Suicide Attempt Goal: STG-Patient Will Comply With Medication Regime Outcome: Progressing Patient is compliant with scheduled medications.     

## 2015-12-29 NOTE — BHH Group Notes (Signed)

## 2015-12-29 NOTE — Progress Notes (Signed)
[facility] MD Progress Note  12/29/2015 2:33 PM Gary Grimes  MRN:  [id number] Subjective:   Patient states "I'm very depressed. I just found out that my girlfriend broke up with me yesterday. I still have thoughts of walking into traffic. Everything started falling apart in September. I don't even know what car I'll drive or where I will live. 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 I'm not sure what is going to happen. My depression is a nine. I was doing pretty good before all this happened. My anxiety is so bad that I have had to ask for prn doses of ativan a lot."   Objective:  Patient seen and chart is reviewed. Mechel continues to report severe depressive symptoms with suicidal ideation. Johnnathan reports increasing anxiety about his stressors related to recent break up and finances. He appears very hopeless about his life situation improving anytime soon. His affect is very depressed throughout the assessment. Patient is noted to still be in bed during the late morning. He reports that ativan for anxiety has been making him sleepy. The patient from results of UDS indicate that he was not taking benzos before admission. He has been requesting this medication frequently during the day. Is requesting to have scheduled medication to help with anxiety and also has chronic back pain. Will decrease the dose/frequency of prn ativan and initiate low dose neurontin. Patient has numerous psychosocial stressors that are exacerbating his depression that also increase his risk for self harm.   Principal Problem: MDD (major depressive disorder), recurrent severe, without psychosis ([place]) Diagnosis:   Patient Active Problem List   Diagnosis Date Noted  . MDD (major depressive disorder), recurrent severe, without psychosis ([place]) [F33.2] 12/27/2015  . Ventral incisional hernia [K43.2] 01/29/2015  . Perirectal abscess [K61.1] 02/18/2012  . Ventral hernia [K43.9] 07/23/2011  . SPONDYLOSIS [M47.9] 09/19/2008  .  CERVICALGIA [M54.2] 09/19/2008  . LOW BACK PAIN [M54.5] 09/19/2008   Total Time spent with patient: 30 minutes  Past Psychiatric History: See H & P  Past Medical History:  Past Medical History  Diagnosis Date  . Hypertension   . Levator syndrome 2001    history   . Perirectal abscess   . High triglycerides   . High cholesterol   . Depression     did get seen in er 4/13 for evaluation-psyc  . Arthritis   . Anginal pain ([place])     ER visit 01/14/2014 visit on chart   . Sleep apnea     pt has CPAP machine but does not use due to mask   . Anxiety     pt denies  . GERD (gastroesophageal reflux disease)   . Panic attacks     Past Surgical History  Procedure Laterality Date  . Appendectomy  1984  . Anal fissure repair  08/05/2000    proctoscopy  . Irrigation and debridement abscess  02/18/2012    peri-rectal  . Umbilical hernia repair  10/27/2010  . Shoulder arthroscopy w/ labral repair  08/08/2007    left  . Colonscopy     . Ventral hernia repair N/A 01/29/2015    Procedure: LAPAROSCOPIC VENTRAL HERNIA;  Surgeon: Excell Seltzer, MD;  Location: [facility];  Service: General;  Laterality: N/A;  . Insertion of mesh N/A 01/29/2015    Procedure: INSERTION OF MESH;  Surgeon: Excell Seltzer, MD;  Location: [facility];  Service: General;  Laterality: N/A;   Family History:  Family History  Problem Relation Age of  Onset  . 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 Marital Status: Widowed    Spouse Name: N/A  . Number of Children: N/A  . Years of Education: N/A   Social History Main Topics  . Smoking status: Former Smoker -- 0.50 packs/day for 1 years    Types: Cigarettes    Quit date: 07/06/1977  . Smokeless tobacco: Never Used  . 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 Drug Use: No  . Sexual Activity: Not Asked   Other Topics Concern  . None   Social History Narrative   Additional Social History:    Pain Medications: none Prescriptions:  (N/A) History of alcohol / drug use?: No history of alcohol / drug abuse Longest period of sobriety (when/how long): N/A Negative Consequences of Use: Personal relationships Withdrawal Symptoms:  (denies previous withdrawal)                    Sleep: Fair  Appetite:  Fair  Current Medications: Current Facility-Administered Medications  Medication Dose Route Frequency Provider Last Rate Last Dose  . acetaminophen (TYLENOL) tablet 650 mg  650 mg Oral Q6H PRN Benjamine Mola, FNP      . alum & mag hydroxide-simeth (MAALOX/MYLANTA) 200-200-20 MG/5ML suspension 30 mL  30 mL Oral Q4H PRN Benjamine Mola, FNP      . citalopram (CELEXA) tablet 30 mg  30 mg Oral Daily Nicholaus Bloom, MD   30 mg at 12/29/15 [phone]  . hydrOXYzine (ATARAX/VISTARIL) tablet 25 mg  25 mg Oral Q6H PRN Benjamine Mola, FNP   25 mg at 12/28/15 2107  . LORazepam (ATIVAN) tablet 0.5 mg  0.5 mg Oral Q6H PRN Niel Hummer, NP      . losartan (COZAAR) tablet 100 mg  100 mg Oral QPM Benjamine Mola, FNP   100 mg at 12/28/15 1655  . magnesium hydroxide (MILK OF MAGNESIA) suspension 30 mL  30 mL Oral Daily PRN Benjamine Mola, FNP      . QUEtiapine (SEROQUEL) tablet 300 mg  300 mg Oral QHS Benjamine Mola, FNP   300 mg at 12/28/15 2107    Lab Results: No results found for this or any previous visit (from the past 48 hour(s)).  Physical Findings: AIMS: Facial and Oral Movements Muscles of Facial Expression: None, normal Lips and Perioral Area: None, normal Jaw: None, normal Tongue: None, normal,Extremity Movements Upper (arms, wrists, hands, fingers): None, normal Lower (legs, knees, ankles, toes): None, normal, Trunk Movements Neck, shoulders, hips: None, normal, Overall  Severity Severity of abnormal movements (highest score from questions above): None, normal Incapacitation due to abnormal movements: None, normal Patient's awareness of abnormal movements (rate only patient's report): No Awareness, Dental Status Current problems with teeth and/or dentures?: No Does patient usually wear dentures?: No  CIWA:  CIWA-Ar Total: 1 COWS:  COWS Total Score: 1  Musculoskeletal: Strength & Muscle Tone: within normal limits Gait & Station: normal Patient leans: N/A  Psychiatric Specialty Exam: Review of Systems  Constitutional: Positive for malaise/fatigue.  HENT: Negative.   Eyes: Negative.   Respiratory: Negative.  Cardiovascular: Negative.   Gastrointestinal: Negative.   Genitourinary: Negative.   Musculoskeletal: Positive for back pain.  Skin: Negative.   Neurological: Negative.   Endo/Heme/Allergies: Negative.   Psychiatric/Behavioral: Positive for depression and suicidal ideas. The patient is nervous/anxious and has insomnia.     Blood pressure 133/66, pulse 79, temperature 98.9 F (37.2 C), temperature source Oral, resp. rate 16, height 6' (1.829 m), weight 122.471 kg (270 lb), SpO2 100 %.Body mass index is 36.61 kg/(m^2).  General Appearance: Disheveled  Eye Sport and exercise psychologist::  Fair  Speech:  Clear and Coherent  Volume:  Decreased  Mood:  Dysphoric, Hopeless and Worthless  Affect:  Flat  Thought Process:  Coherent and Intact  Orientation:  Full (Time, Place, and Person)  Thought Content:  Rumination  Suicidal Thoughts:  Yes.  with intent/plan  Homicidal Thoughts:  No  Memory:  Immediate;   Good Recent;   Good Remote;   Good  Judgement:  Fair  Insight:  Fair  Psychomotor Activity:  Decreased  Concentration:  Fair  Recall:  [place] of Knowledge:Fair  Language: Good  Akathisia:  No  Handed:  Right  AIMS (if indicated):     Assets:  Communication Skills Desire for Improvement Leisure Time Physical  Health Resilience Vocational/Educational  ADL's:  Intact  Cognition: WNL  Sleep:  Number of Hours: 6.75   Treatment Plan Summary: Daily contact with patient to assess and evaluate symptoms and progress in treatment and Medication management  -Continue Celexa 30 mg daily for depression -Continue Seroquel 300 mg hs for mood control/insomnia  -Continue vistaril 25 mg every six hours as needed for anxiety -Decrease Ativan to 0.5 mg every six hours as needed as it is causing excessive sedation during the day and to decrease risk of patient developing a dependency  -Continue Cozaar 100 mg daily for Hypertension  -Start Neurontin 100 mg TID for anxiety/chronic pain   DAVIS, LAURA, NP-C 12/29/2015, 2:33 PM I agree with assessment and plan Geralyn Flash A. [name] Heck, M.D.

## 2015-12-30 MED ORDER — TRAZODONE HCL 50 MG PO TABS: 50.0000 mg | ORAL_TABLET | Freq: Every evening | ORAL | Status: DC | PRN

## 2015-12-30 MED ORDER — GABAPENTIN 100 MG PO CAPS
100.0000 mg | ORAL_CAPSULE | Freq: Three times a day (TID) | ORAL | Status: DC
  Administered 2015-12-30 – 2016-01-01 (×5): 100 mg via ORAL
  Filled 2015-12-30 (×11): qty 1

## 2015-12-30 MED ORDER — TRAZODONE HCL 50 MG PO TABS
50.0000 mg | ORAL_TABLET | Freq: Every evening | ORAL | Status: DC | PRN
Start: 2015-12-30 — End: 2015-12-31
  Administered 2015-12-30: 50 mg via ORAL
  Filled 2015-12-30 (×7): qty 1

## 2015-12-30 NOTE — BHH Group Notes (Signed)
[facility] LCSW Aftercare Discharge Planning Group Note  12/30/2015 8:45 AM  Participation Quality: Alert, Appropriate and Oriented  Mood/Affect: Flat and "Hopeless"  Depression Rating: 9  Anxiety Rating: 9  Thoughts of Suicide: Pt endorses passive SI "off and on"  Will you contract for safety? Yes  Current AVH: Pt denies  Plan for Discharge/Comments: Pt attended discharge planning group and actively participated in group. CSW discussed suicide prevention education with the group and encouraged them to discuss discharge planning and any relevant barriers. Pt reports that he is not doing well today; describes feeling anxious about stressors.  Transportation Means: Pt reports access to transportation  Supports: No supports mentioned at this time  Peri Maris, [place] 12/30/2015 10:09 AM

## 2015-12-30 NOTE — Progress Notes (Addendum)
10:25: Spoke with patient 1:1. Flat, sad and depressed both in affect, mood. Complaining of anxiety and reports worrying "about everything. It's not just one thing." Patient given support, reassurance. Medicated per orders and ativan prn given per his request. Reports some relief on reassess. Patient denies SI, thoughts to self harm. Verbally contracts with this Probation officer should those thoughts develop. No HI. Will continue to monitor every 15 mins, provide support as needed. Patient safe. [place]: patient at nurses's station. Expressing hopelessness. "I'm about to lose everything. I just don't know what to do. I have nothing. I don't want to live this life." Processed with patient 1:1 offering support. Vistaril prn given. Spiritual consult ordered as patient indicates prayer is helpful for him. Patient acknowledges he does have people who love him and that they are reasons to live. He continues to contract verbally for safety. Will continue to monitor closely. Jamie Kato

## 2015-12-30 NOTE — BHH Group Notes (Signed)
[place] LCSW Group Therapy  12/30/2015 1:15pm  Type of Therapy:  Group Therapy vercoming Obstacles  Participation Level:  Reserved  Participation Quality:  Appropriate   Affect:  Flat  Cognitive:  Appropriate and Oriented  Insight:  Developing/Improving and Improving  Engagement in Therapy:  Improving  Modes of Intervention:  Discussion, Exploration, Problem-solving and Support  Description of Group:   In this group patients will be encouraged to explore what they see as obstacles to their own wellness and recovery. They will be guided to discuss their thoughts, feelings, and behaviors related to these obstacles. The group will process together ways to cope with barriers, with attention given to specific choices patients can make. Each patient will be challenged to identify changes they are motivated to make in order to overcome their obstacles. This group will be process-oriented, with patients participating in exploration of their own experiences as well as giving and receiving support and challenge from other group members.  Summary of Patient Progress: Pt observed to be reserved during group discussion, however did identify his financial struggles as his biggest obstacle currently. Pt inquired of group, "what do you do when you have no motivation." Pt was receptive to feedback and thanked his peers for providing suggestions.   Therapeutic Modalities:   Cognitive Behavioral Therapy Solution Focused Therapy Motivational Interviewing Relapse Prevention Therapy   Peri Maris, LCSWA 12/30/2015 3:01 PM

## 2015-12-30 NOTE — Tx Team (Signed)
Interdisciplinary Treatment Plan Update (Adult) Date: 12/30/2015   Date: 12/30/2015 10:11 AM  Progress in Treatment:  Attending groups: Yes  Participating in groups: Yes  Taking medication as prescribed: Yes  Tolerating medication: Yes  Family/Significant othe contact made: No, CSW attempting to make contact with son Patient understands diagnosis: Yes AEB seeking help with depression Discussing patient identified problems/goals with staff: Yes  Medical problems stabilized or resolved: Yes  Denies suicidal/homicidal ideation: No endorses passive SI Patient has not harmed self or Others: Yes   New problem(s) identified: None identified at this time.   Discharge Plan or Barriers: Pt will discharge home and follow-up with [facility].  Additional comments:  Patient and CSW reviewed pt's identified goals and treatment plan. Patient verbalized understanding and agreed to treatment plan. CSW reviewed [facility] "Discharge Process and Patient Involvement" Form. Pt verbalized understanding of information provided and signed form.   Reason for Continuation of Hospitalization:  Anxiety Depression Medication stabilization Suicidal ideation  Estimated length of stay: 3-5 days  Review of initial/current patient goals per problem list:   1.  Goal(s): Patient will participate in aftercare plan  Met:  Yes  Target date: 3-5 days from date of admission   As evidenced by: Patient will participate within aftercare plan AEB aftercare provider and housing plan at discharge being identified.   12/30/15: Pt will DC home and follow-up with [facility]  2.  Goal (s): Patient will exhibit decreased depressive symptoms and suicidal ideations.  Met:  No  Target date: 3-5 days from date of admission   As evidenced by: Patient will utilize self rating of depression at 3 or below and demonstrate decreased signs of depression or be deemed stable for discharge by MD.  12/30/15: Pt rates depression at 9/10; endorses passive  SI  3.  Goal(s): Patient will demonstrate decreased signs and symptoms of anxiety.  Met:  No  Target date: 3-5 days from date of admission   As evidenced by: Patient will utilize self rating of anxiety at 3 or below and demonstrated decreased signs of anxiety, or be deemed stable for discharge by MD  12/30/15: Pt rates anxiety at 9/10; appears withdrawn in the milieu.  Attendees:  Patient:    Family:    Physician: Dr. Parke Poisson, MD  12/30/2015 10:11 AM  Nursing: Lars Pinks, RN Case manager  12/30/2015 10:11 AM  Clinical Social Worker Peri Maris, [place] 12/30/2015 10:11 AM  Other: Tilden Fossa, [place] 12/30/2015 10:11 AM  Clinical: Loletta Specter, RN 12/30/2015 10:11 AM  Other: , RN Charge Nurse 12/30/2015 10:11 AM  Other:     Peri Maris, [place] Social Work [phone]

## 2015-12-30 NOTE — Progress Notes (Signed)
[facility] MD Progress Note  12/30/2015  Gary Grimes  MRN:  [id number] Subjective:   Patient states "I'm so overwhelmed with everything going on. Bankruptcy alongside a breakup"  Objective: Pt seen and chart reviewed. Pt is alert/oriented x4, calm, cooperative, and appropriate to situation. Pt denies homicidal ideation and psychosis and does not appear to be responding to internal stimuli. Pt does report suicidal ideation without plan or intent.   Principal Problem: MDD (major depressive disorder), recurrent severe, without psychosis ([place]) Diagnosis:   Patient Active Problem List   Diagnosis Date Noted  . MDD (major depressive disorder), recurrent severe, without psychosis ([place]) [F33.2] 12/27/2015  . Ventral incisional hernia [K43.2] 01/29/2015  . Perirectal abscess [K61.1] 02/18/2012  . Ventral hernia [K43.9] 07/23/2011  . SPONDYLOSIS [M47.9] 09/19/2008  . CERVICALGIA [M54.2] 09/19/2008  . LOW BACK PAIN [M54.5] 09/19/2008   Total Time spent with patient: 15 minutes  Past Psychiatric History: See H & P  Past Medical History:  Past Medical History  Diagnosis Date  . Hypertension   . Levator syndrome 2001    history   . Perirectal abscess   . High triglycerides   . High cholesterol   . Depression     did get seen in er 4/13 for evaluation-psyc  . Arthritis   . Anginal pain ([place])     ER visit 01/14/2014 visit on chart   . Sleep apnea     pt has CPAP machine but does not use due to mask   . Anxiety     pt denies  . GERD (gastroesophageal reflux disease)   . Panic attacks     Past Surgical History  Procedure Laterality Date  . Appendectomy  1984  . Anal fissure repair  08/05/2000    proctoscopy  . Irrigation and debridement abscess  02/18/2012    peri-rectal  . Umbilical hernia repair  10/27/2010  . Shoulder arthroscopy w/ labral repair  08/08/2007    left  . Colonscopy     . Ventral hernia repair N/A 01/29/2015    Procedure: LAPAROSCOPIC VENTRAL HERNIA;  Surgeon: Excell Seltzer, MD;  Location: [facility];  Service: General;  Laterality: N/A;  . Insertion of mesh N/A 01/29/2015    Procedure: INSERTION OF MESH;  Surgeon: Excell Seltzer, MD;  Location: [facility];  Service: General;  Laterality: N/A;   Family History:  Family History  Problem Relation Age of Onset  . 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 Marital Status: Widowed    Spouse Name: N/A  . Number of Children: N/A  . Years of Education: N/A   Social History Main Topics  . Smoking status: Former Smoker -- 0.50 packs/day for 1 years    Types: Cigarettes    Quit date: 07/06/1977  . Smokeless tobacco: Never Used  . 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 Drug Use: No  . Sexual Activity: Not Asked   Other Topics Concern  . None   Social History Narrative   Additional Social History:    Pain Medications: none Prescriptions:  (N/A) History of alcohol / drug use?: No history of alcohol / drug  abuse Longest period of sobriety (when/how long): N/A Negative Consequences of Use: Personal relationships Withdrawal Symptoms:  (denies previous withdrawal)                    Sleep: Fair  Appetite:  Fair  Current Medications: Current Facility-Administered Medications  Medication Dose Route Frequency Provider Last Rate Last Dose  . acetaminophen (TYLENOL) tablet 650 mg  650 mg Oral Q6H PRN Benjamine Mola, FNP      . alum & mag hydroxide-simeth (MAALOX/MYLANTA) 200-200-20 MG/5ML suspension 30 mL  30 mL Oral Q4H PRN Benjamine Mola, FNP      . citalopram (CELEXA) tablet 30 mg  30 mg Oral Daily Nicholaus Bloom, MD   30 mg at 12/30/15 [id number]  . hydrOXYzine (ATARAX/VISTARIL) tablet 25 mg  25 mg Oral Q6H PRN Benjamine Mola,  FNP   25 mg at 12/30/15 1114  . LORazepam (ATIVAN) tablet 0.5 mg  0.5 mg Oral Q6H PRN Niel Hummer, NP   0.5 mg at 12/30/15 1534  . losartan (COZAAR) tablet 100 mg  100 mg Oral QPM Benjamine Mola, FNP   100 mg at 12/29/15 1711  . magnesium hydroxide (MILK OF MAGNESIA) suspension 30 mL  30 mL Oral Daily PRN Benjamine Mola, FNP      . QUEtiapine (SEROQUEL) tablet 300 mg  300 mg Oral QHS Benjamine Mola, FNP   300 mg at 12/29/15 2107    Lab Results: No results found for this or any previous visit (from the past 48 hour(s)).  Physical Findings: AIMS: Facial and Oral Movements Muscles of Facial Expression: None, normal Lips and Perioral Area: None, normal Jaw: None, normal Tongue: None, normal,Extremity Movements Upper (arms, wrists, hands, fingers): None, normal Lower (legs, knees, ankles, toes): None, normal, Trunk Movements Neck, shoulders, hips: None, normal, Overall Severity Severity of abnormal movements (highest score from questions above): None, normal Incapacitation due to abnormal movements: None, normal Patient's awareness of abnormal movements (rate only patient's report): No Awareness, Dental Status Current problems with teeth and/or dentures?: No Does patient usually wear dentures?: No  CIWA:  CIWA-Ar Total: 1 COWS:  COWS Total Score: 1  Musculoskeletal: Strength & Muscle Tone: within normal limits Gait & Station: normal Patient leans: N/A  Psychiatric Specialty Exam: Review of Systems  Constitutional: Positive for malaise/fatigue.  HENT: Negative.   Eyes: Negative.   Respiratory: Negative.   Cardiovascular: Negative.   Gastrointestinal: Negative.   Genitourinary: Negative.   Musculoskeletal: Positive for back pain.  Skin: Negative.   Neurological: Negative.   Endo/Heme/Allergies: Negative.   Psychiatric/Behavioral: Positive for depression and suicidal ideas. The patient is nervous/anxious and has insomnia.     Blood pressure 149/97, pulse 82, temperature 98.8  F (37.1 C), temperature source Oral, resp. rate 20, height 6' (1.829 m), weight 122.471 kg (270 lb), SpO2 100 %.Body mass index is 36.61 kg/(m^2).  General Appearance: Disheveled  Eye Contact::  Good  Speech:  Clear and Coherent  Volume:  Decreased  Mood:  Dysphoric, Hopeless and Worthless  Affect:  Depressed, tearful  Thought Process:  Coherent and Intact  Orientation:  Full (Time, Place, and Person)  Thought Content:  Rumination yet improving  Suicidal Thoughts:  Yes, but generalized, no plan/intent  Homicidal Thoughts:  No  Memory:  Immediate;   Good Recent;   Good Remote;   Good  Judgement:  Fair  Insight:  Fair  Psychomotor Activity:  Normal  Concentration:  Fair  Recall:  [place]  Language: Good  Akathisia:  No  Handed:  Right  AIMS (if indicated):     Assets:  Communication Skills Desire for Improvement Leisure Time Physical Health Resilience Vocational/Educational  ADL's:  Intact  Cognition: WNL  Sleep:  Number of Hours: 6.75   Treatment Plan Summary: Daily contact with patient to assess and evaluate symptoms and progress in treatment and Medication management  -Continue Celexa 30 mg daily for depression -Continue Seroquel 300 mg hs for mood control/insomnia  -Continue vistaril 25 mg every six hours as needed for anxiety -Decrease Ativan to 0.5 mg every six hours as needed as it is causing excessive sedation during the day and to decrease risk of patient developing a dependency  -Continue Cozaar 100 mg daily for Hypertension  -Continue Neurontin 100 mg TID for anxiety/chronic pain  -Start Trazodone 50mg  qhs prn insomnia  Benjamine Mola, FNP-BC 12/30/2015, 2:30PM

## 2015-12-30 NOTE — Progress Notes (Signed)
Adult Psychoeducational Group Note  Date:  12/30/2015 Time:  8:20 PM  Group Topic/Focus:  Wrap-Up Group:   The focus of this group is to help patients review their daily goal of treatment and discuss progress on daily workbooks.  Participation Level:  Active  Participation Quality:  Appropriate  Affect:  Appropriate  Cognitive:  Appropriate  Insight: Appropriate  Engagement in Group:  Engaged  Modes of Intervention:  Discussion  Additional Comments:  Pt rated overall day a 3 out of 10 because "it was a rough day". Pt reported that he did not achieve his goal for the day, which was to attend all groups. Pt reported that positive interaction and positive communication with other patients on the unit throughout the day was the highlight of his day.   Lincoln Brigham 12/30/2015, 9:21 PM

## 2015-12-30 NOTE — BHH Suicide Risk Assessment (Signed)
[place] INPATIENT:  Family/Significant Other Suicide Prevention Education  Suicide Prevention Education:  Education Completed; Diedrich Iacono, Pt's son [phone],  (name of family member/significant other) has been identified by the patient as the family member/significant other with whom the patient will be residing, and identified as the person(s) who will aid the patient in the event of a mental health crisis (suicidal ideations/suicide attempt).  With written consent from the patient, the family member/significant other has been provided the following suicide prevention education, prior to the and/or following the discharge of the patient.  The suicide prevention education provided includes the following:  Suicide risk factors  Suicide prevention and interventions  National Suicide Hotline telephone number  [facility] assessment telephone number  [facility] Emergency Assistance [place] and/or Residential Mobile Crisis Unit telephone number  Request made of family/significant other to:  Remove weapons (e.g., guns, rifles, knives), all items previously/currently identified as safety concern.    Remove drugs/medications (over-the-counter, prescriptions, illicit drugs), all items previously/currently identified as a safety concern.  The family member/significant other verbalizes understanding of the suicide prevention education information provided.  The family member/significant other agrees to remove the items of safety concern listed above.  Bo Mcclintock 12/30/2015, 1:14 PM

## 2015-12-30 NOTE — Plan of Care (Signed)
Problem: Alteration in mood Goal: STG-Patient reports thoughts of self-harm to staff Outcome: Progressing Patient denying SI, thoughts to self harm.  Problem: Ineffective individual coping Goal: STG: Patient will remain free from self harm Outcome: Progressing Patient has not engaged in self harm, denies thoughts to.

## 2015-12-30 NOTE — Progress Notes (Signed)
Recreation Therapy Notes  Date: 01.02.2017 Time: 9:30am Location: 300 Group Room   Group Topic: Stress Management  Goal Area(s) Addresses:  Patient will actively participate in stress management techniques presented during session.   Behavioral Response: Did not attend.   Laureen Ochs Ragena Fiola, LRT/CTRS        Jia Mohamed L 12/30/2015 10:22 AM

## 2015-12-31 MED ORDER — LORAZEPAM 0.5 MG PO TABS
0.5000 mg | ORAL_TABLET | Freq: Three times a day (TID) | ORAL | Status: DC | PRN
  Administered 2016-01-01 – 2016-01-06 (×6): 0.5 mg via ORAL
  Filled 2015-12-31 (×6): qty 1

## 2015-12-31 MED ORDER — MIRTAZAPINE 7.5 MG PO TABS
7.5000 mg | ORAL_TABLET | Freq: Every day | ORAL | Status: DC
Start: 2015-12-31 — End: 2016-01-01
  Administered 2015-12-31: 7.5 mg via ORAL
  Filled 2015-12-31 (×3): qty 1

## 2015-12-31 MED ORDER — LORAZEPAM 0.5 MG PO TABS: 0.5000 mg | ORAL_TABLET | Freq: Two times a day (BID) | ORAL | Status: DC | PRN

## 2015-12-31 MED ORDER — FLUOXETINE HCL 20 MG PO CAPS
20.0000 mg | ORAL_CAPSULE | Freq: Every day | ORAL | Status: DC
  Administered 2015-12-31 – 2016-01-06 (×7): 20 mg via ORAL
  Filled 2015-12-31 (×4): qty 1
  Filled 2015-12-31: qty 7
  Filled 2015-12-31 (×6): qty 1

## 2015-12-31 NOTE — Progress Notes (Addendum)
Patient ID: Gary Grimes, male   DOB: 01/17/1961, 55 y.o.   MRN: [id number] [facility] MD Progress Note  12/31/2015  Gary Grimes  MRN:  [id number] Subjective:   Patient reports ongoing depression, sadness, and feeling overwhelmed by stressors, mainly financial stressors. He states he is concerned that " I am going to end up being homeless ".   Objective: Patient seen , case discussed with staff. Patient reports ongoing depression, although does feel there has been some improvement compared to admission. Denies medication side effects at present . Continues to ruminate about stressors, mainly financial concerns. States that he had been unemployed for a period of time and recently got a job, but that it pays very little. Feels hopeless and overwhelmed. Also describes relationship stressors with GF. At present denies medication side effects but does not feel medications are helping . He has been on Seroquel ( " for sleep") and on Celexa up to 30 mgrs QDAY . Of note, he has gained about 20 lbs since he has been on Seroquel. Denies history of mania /hypomania, or psychosis.  States " they are not helping ". We discussed options and he agrees to new antidepressant trial . He states " I need it to be an affordable medication because otherwise I don't think I can afford it" Patient visible in milieu, day room. No disruptive or agitated behaviors on unit.   Principal Problem: MDD (major depressive disorder), recurrent severe, without psychosis ([place]) Diagnosis:   Patient Active Problem List   Diagnosis Date Noted  . MDD (major depressive disorder), recurrent severe, without psychosis ([place]) [F33.2] 12/27/2015  . Ventral incisional hernia [K43.2] 01/29/2015  . Perirectal abscess [K61.1] 02/18/2012  . Ventral hernia [K43.9] 07/23/2011  . SPONDYLOSIS [M47.9] 09/19/2008  . CERVICALGIA [M54.2] 09/19/2008  . LOW BACK PAIN [M54.5] 09/19/2008   Total Time spent with patient: 20 minutes   Past Psychiatric  History: See H & P  Past Medical History:  Past Medical History  Diagnosis Date  . Hypertension   . Levator syndrome 2001    history   . Perirectal abscess   . High triglycerides   . High cholesterol   . Depression     did get seen in er 4/13 for evaluation-psyc  . Arthritis   . Anginal pain ([place])     ER visit 01/14/2014 visit on chart   . Sleep apnea     pt has CPAP machine but does not use due to mask   . Anxiety     pt denies  . GERD (gastroesophageal reflux disease)   . Panic attacks     Past Surgical History  Procedure Laterality Date  . Appendectomy  1984  . Anal fissure repair  08/05/2000    proctoscopy  . Irrigation and debridement abscess  02/18/2012    peri-rectal  . Umbilical hernia repair  10/27/2010  . Shoulder arthroscopy w/ labral repair  08/08/2007    left  . Colonscopy     . Ventral hernia repair N/A 01/29/2015    Procedure: LAPAROSCOPIC VENTRAL HERNIA;  Surgeon: Excell Seltzer, MD;  Location: [facility] ORS;  Service: General;  Laterality: N/A;  . Insertion of mesh N/A 01/29/2015    Procedure: INSERTION OF MESH;  Surgeon: Excell Seltzer, MD;  Location: [facility] ORS;  Service: General;  Laterality: N/A;   Family History:  Family History  Problem Relation Age of Onset  . 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 Marital Status: Widowed    Spouse Name: N/A  . Number of Children: N/A  . Years of Education: N/A   Social History Main Topics  . Smoking status: Former Smoker -- 0.50 packs/day for 1 years    Types: Cigarettes    Quit date: 07/06/1977  . Smokeless tobacco: Never Used  . 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 Drug Use: No  . Sexual Activity: Not Asked   Other Topics Concern   . None   Social History Narrative   Additional Social History:    Pain Medications: none Prescriptions:  (N/A) History of alcohol / drug use?: No history of alcohol / drug abuse Longest period of sobriety (when/how long): N/A Negative Consequences of Use: Personal relationships Withdrawal Symptoms:  (denies previous withdrawal)  Sleep: Fair  Appetite:  Fair  Current Medications: Current Facility-Administered Medications  Medication Dose Route Frequency Provider Last Rate Last Dose  . acetaminophen (TYLENOL) tablet 650 mg  650 mg Oral Q6H PRN Benjamine Mola, FNP      . alum & mag hydroxide-simeth (MAALOX/MYLANTA) 200-200-20 MG/5ML suspension 30 mL  30 mL Oral Q4H PRN Benjamine Mola, FNP      . FLUoxetine (PROZAC) capsule 20 mg  20 mg Oral Daily Myer Peer Cobos, MD      . gabapentin (NEURONTIN) capsule 100 mg  100 mg Oral TID Benjamine Mola, FNP   100 mg at 12/31/15 1140  . hydrOXYzine (ATARAX/VISTARIL) tablet 25 mg  25 mg Oral Q6H PRN Benjamine Mola, FNP   25 mg at 12/31/15 1518  . LORazepam (ATIVAN) tablet 0.5 mg  0.5 mg Oral Q6H PRN Niel Hummer, NP   0.5 mg at 12/31/15 1002  . losartan (COZAAR) tablet 100 mg  100 mg Oral QPM Benjamine Mola, FNP   100 mg at 12/30/15 1706  . magnesium hydroxide (MILK OF MAGNESIA) suspension 30 mL  30 mL Oral Daily PRN Benjamine Mola, FNP      . mirtazapine (REMERON) tablet 7.5 mg  7.5 mg Oral QHS Myer Peer Cobos, MD      . traZODone (DESYREL) tablet 50 mg  50 mg Oral QHS,MR X 1 Benjamine Mola, FNP   50 mg at 12/30/15 2140    Lab Results: No results found for this or any previous visit (from the past 48 hour(s)).  Physical Findings: AIMS: Facial and Oral Movements Muscles of Facial Expression: None, normal Lips and Perioral Area: None, normal Jaw: None, normal Tongue: None, normal,Extremity Movements Upper (arms, wrists, hands, fingers): None, normal Lower (legs, knees, ankles, toes): None, normal, Trunk Movements Neck, shoulders, hips:  None, normal, Overall Severity Severity of abnormal movements (highest score from questions above): None, normal Incapacitation due to abnormal movements: None, normal Patient's awareness of abnormal movements (rate only patient's report): No Awareness, Dental Status Current problems with teeth and/or dentures?: No Does patient usually wear dentures?: No  CIWA:  CIWA-Ar Total: 1 COWS:  COWS Total Score: 1  Musculoskeletal: Strength & Muscle Tone: within normal limits Gait & Station: normal Patient leans: N/A  Psychiatric Specialty Exam: Review of Systems  Constitutional: Positive for malaise/fatigue.  HENT: Negative.  Eyes: Negative.   Respiratory: Negative.   Cardiovascular: Negative.   Gastrointestinal: Negative.   Genitourinary: Negative.   Musculoskeletal: Positive for back pain.  Skin: Negative.   Neurological: Negative.   Endo/Heme/Allergies: Negative.   Psychiatric/Behavioral: Positive for depression and suicidal ideas. The patient is nervous/anxious and has insomnia.     Blood pressure 103/67, pulse 78, temperature 98.3 F (36.8 C), temperature source Oral, resp. rate 12, height 6' (1.829 m), weight 270 lb (122.471 kg), SpO2 100 %.Body mass index is 36.61 kg/(m^2).  General Appearance: Fairly Groomed  Engineer, water::  Good  Speech:  Clear and Coherent  Volume:  Decreased  Mood:  Depressed   Affect:  Constricted, tearful when discussing stressors   Thought Process:  Coherent and Intact  Orientation:  Full (Time, Place, and Person)  Thought Content:   Ruminations about stressors, no psychotic symptoms  Suicidal Thoughts:   Today denies plan or intention of SI or of hurting himself and contracts for safety on the unit   Homicidal Thoughts:  No  Memory:  Immediate;   Good Recent;   Good Remote;   Good  Judgement:  Fair  Insight:  Fair  Psychomotor Activity:  Normal  Concentration:  Fair  Recall:  [place] of Knowledge:Fair  Language: Good  Akathisia:  No   Handed:  Right  AIMS (if indicated):     Assets:  Communication Skills Desire for Improvement Leisure Time Physical Health Resilience Vocational/Educational  ADL's:  Intact  Cognition: WNL  Sleep:  Number of Hours: 6.75  Assessment - patient remains depressed, constricted, ruminative and subjectively overwhelmed by his stressors. Not suicidal and able to contract for safety at this time. Has been on Celexa/Seroquel for several weeks/months, but reports little improvement and there has been significant weight gain on Seroquel. We discussed options and he is agreeing to medication change as does not think current ones are helping much. Agrees to PROZAC/REMERON trial. Side effects discussed . Of  Note, patient not currently endorsing history of psychosis or of mania . Treatment Plan Summary: Daily contact with patient to assess and evaluate symptoms and progress in treatment and Medication management -Encourage group participation, milieu participation to work on coping skills and symptom reduction. -D/C Celexa  -D/C Seroquel  - Start PROZAC 20 mgrs QDAY for depression -Start REMERON 7.5 mgrs QHS for depression and for insomnia  -Continue  vistaril 25 mg every six hours as needed for anxiety -Decrease Ativan to 0.5 mg every 8 hours  PRN as needed for severe anxiety -Continue Cozaar 100 mg daily for Hypertension  -Continue Neurontin 100 mg TID for anxiety/chronic pain    COBOS, Felicita Gage, MD  12/31/2015, 2:30PM

## 2015-12-31 NOTE — Progress Notes (Signed)
Adult Psychoeducational Group Note  Date:  12/31/2015 Time:  10:21 PM  Group Topic/Focus:  Wrap-Up Group:   The focus of this group is to help patients review their daily goal of treatment and discuss progress on daily workbooks.  Participation Level:  Active  Participation Quality:  Appropriate  Affect:  Appropriate  Cognitive:  Alert  Insight: Appropriate  Engagement in Group:  Engaged  Modes of Intervention:  Discussion  Additional Comments:  Patient stated his day started off rough, to where he wanted to sleep a lot. Patient stated other patient's made him feel a lot better by talking with him. Patient stated something positive that happened today was his doctor changing his medicine.   Philmore Lepore L Rhianne Soman 12/31/2015, 10:21 PM

## 2015-12-31 NOTE — Progress Notes (Signed)
   D: Pt approached the writer and stated, "It's been a long day". Asked the writer if he could receive his ativan. Stated that he has "a lot on his mind". Pt states he's wondering how he's going to cope when he's discharged. Pt mentioned his responsibilities.   A:  Support and encouragement was offered. 15 min checks continued for safety.  R: Pt remains safe.

## 2015-12-31 NOTE — B[facility] Group Notes (Signed)
[place] Group Notes:  (Nursing/MHT/Case Management/Adjunct)  Date:  12/31/2015  Time:  11:00 AM  Type of Therapy:  Psychoeducational Skills  Participation Level:  Minimal  Participation Quality:  Appropriate and Attentive  Affect:  Anxious and Appropriate  Cognitive:  Alert and Oriented  Insight:  Lacking  Engagement in Group:  Supportive  Modes of Intervention:  Discussion  Summary of Progress/Problems:  Pt. Attended group but made no comments or asked any questions.   He was alert and attentive and listened with interest as peers talked.  Pt. Appeared to have improved spirits after the group. Oretha Milch 12/31/2015, 11:00 AM

## 2015-12-31 NOTE — Progress Notes (Signed)
Recreation Therapy Notes  Animal-Assisted Activity (AAA) Program Checklist/Progress Notes Patient Eligibility Criteria Checklist & Daily Group note for Rec Tx Intervention  Date: 01.03.2017 Time: 2:45pm Location: [address]    AAA/T Program Assumption of Risk Form signed by Patient/ or Parent Legal Guardian yes  Patient is free of allergies or sever asthma yes  Patient reports no fear of animals yes  Patient reports no history of cruelty to animals yes  Patient understands his/her participation is voluntary yes  Patient washes hands before animal contact yes  Patient washes hands after animal contact yes  Behavioral Response: Appropriate   Education: Hand Washing, Appropriate Animal Interaction   Education Outcome: Acknowledges education.   Clinical Observations/Feedback: Patient engaged appropriately with therapy dog and peers during session. Additionally patient asked appropriate questions about therapy dog and his training.   Laureen Ochs Neelie Welshans, LRT/CTRS        Nitasha Jewel L 12/31/2015 2:07 PM

## 2015-12-31 NOTE — B[facility] Group Notes (Signed)
[place] LCSW Group Therapy 12/31/2015 1:15 PM  Type of Therapy: Group Therapy- Feelings about Diagnosis  Participation Level: Minimal  Participation Quality:  Reserved  Affect:  Appropriate  Cognitive: Alert and Oriented   Insight:  Developing   Engagement in Therapy: Developing/Improving   Modes of Intervention: Clarification, Confrontation, Discussion, Education, Exploration, Limit-setting, Orientation, Problem-solving, Rapport Building, Art therapist, Socialization and Support  Description of Group:   This group will allow patients to explore their thoughts and feelings about diagnoses they have received. Patients will be guided to explore their level of understanding and acceptance of these diagnoses. Facilitator will encourage patients to process their thoughts and feelings about the reactions of others to their diagnosis, and will guide patients in identifying ways to discuss their diagnosis with significant others in their lives. This group will be process-oriented, with patients participating in exploration of their own experiences as well as giving and receiving support and challenge from other group members.  Summary of Progress/Problems:  Pt expressed that having mental health concerns is difficult. He also reports that he does not know what his diagnosis is and CSW encouraged him to speak with the doctor about his diagnosis.   Therapeutic Modalities:   Cognitive Behavioral Therapy Solution Focused Therapy Motivational Interviewing Relapse Prevention Therapy  Peri Maris, LCSWA 12/31/2015 3:13 PM

## 2016-01-01 MED ORDER — IBUPROFEN 400 MG PO TABS
400.0000 mg | ORAL_TABLET | Freq: Four times a day (QID) | ORAL | Status: DC | PRN
  Administered 2016-01-01 – 2016-01-03 (×2): 400 mg via ORAL
  Filled 2016-01-01: qty 1

## 2016-01-01 MED ORDER — MIRTAZAPINE 15 MG PO TABS
15.0000 mg | ORAL_TABLET | Freq: Every day | ORAL | Status: DC
  Administered 2016-01-01 – 2016-01-07 (×7): 15 mg via ORAL
  Filled 2016-01-01: qty 1
  Filled 2016-01-01: qty 7
  Filled 2016-01-01 (×3): qty 1
  Filled 2016-01-01: qty 7
  Filled 2016-01-01 (×3): qty 1
  Filled 2016-01-01: qty 7

## 2016-01-01 MED ORDER — TRAZODONE HCL 50 MG PO TABS
50.0000 mg | ORAL_TABLET | Freq: Every evening | ORAL | Status: DC | PRN
  Administered 2016-01-01 – 2016-01-02 (×2): 50 mg via ORAL
  Filled 2016-01-01 (×8): qty 1

## 2016-01-01 MED ORDER — TRAZODONE HCL 50 MG PO TABS: 50.0000 mg | ORAL_TABLET | Freq: Every evening | ORAL | Status: DC | PRN

## 2016-01-01 MED ORDER — IBUPROFEN 400 MG PO TABS
ORAL_TABLET | ORAL | Status: AC
  Filled 2016-01-01: qty 1

## 2016-01-01 MED ORDER — GABAPENTIN 100 MG PO CAPS
200.0000 mg | ORAL_CAPSULE | Freq: Two times a day (BID) | ORAL | Status: DC
  Administered 2016-01-01 – 2016-01-06 (×10): 200 mg via ORAL
  Filled 2016-01-01 (×5): qty 2
  Filled 2016-01-01: qty 28
  Filled 2016-01-01 (×8): qty 2
  Filled 2016-01-01: qty 28

## 2016-01-01 NOTE — Progress Notes (Signed)
Recreation Therapy Notes  Date: 01.04.2017 Time: 9:30am Location: [address] Group Room   Group Topic: Stress Management  Goal Area(s) Addresses:  Patient will actively participate in stress management techniques presented during session.   Behavioral Response: Appropriate   Intervention: Stress management techniques  Activity :  Deep Breathing and Guided Imagery. LRT provided instruction and demonstration on practice of Guided Imagery. Technique was coupled with deep breathing.   Education:  Stress Management, Discharge Planning.   Education Outcome: Acknowledges education  Clinical Observations/Feedback: Patient actively engaged in technique introduced, expressed no concerns and demonstrated ability to practice independently post d/c.   Laureen Ochs Nikoloz Huy, LRT/CTRS  Lane Hacker 01/01/2016 12:51 PM

## 2016-01-01 NOTE — Progress Notes (Signed)
Adult Psychoeducational Group Note  Date:  01/01/2016 Time:  10:26 PM  Group Topic/Focus:  Wrap-Up Group:   The focus of this group is to help patients review their daily goal of treatment and discuss progress on daily workbooks.  Participation Level:  Active  Participation Quality:  Appropriate  Affect:  Appropriate  Cognitive:  Alert  Insight: Appropriate  Engagement in Group:  Engaged  Modes of Intervention:  Discussion  Additional Comments:  Patient stated his goal for today was to attend all groups. Patient rated his day as a 1, but as the day continued it got better. Patient stated the doctor tweaked his medicine.   Ayona Yniguez L Zimal Weisensel 01/01/2016, 10:26 PM

## 2016-01-01 NOTE — B[facility] Group Notes (Signed)
[facility] LCSW Aftercare Discharge Planning Group Note  01/01/2016 8:45 AM  Participation Quality: Alert, Appropriate and Oriented  Mood/Affect: Flat  Depression Rating: 6  Anxiety Rating: 6  Thoughts of Suicide: Pt denies SI/HI  Will you contract for safety? Yes  Current AVH: Pt denies  Plan for Discharge/Comments: Pt attended discharge planning group and actively participated in group. CSW discussed suicide prevention education with the group and encouraged them to discuss discharge planning and any relevant barriers. Pt reports that he is "doing okay" and describes not sleeping well last night. No other needs identified.  Transportation Means: Pt reports access to transportation  Supports: No supports mentioned at this time  Peri Maris, [place] 01/01/2016 9:53 AM

## 2016-01-01 NOTE — Progress Notes (Signed)
   D: Pt still appears to be flat and depressed. Stated, "my day wasn't that great". When asked if there was anything that we could have done to make it better, pt stated, "No, it's situational circumstances". Pt has no other questions or concerns.   A:  Support and encouragement was offered. 15 min checks continued for safety.  R: Pt remains safe.

## 2016-01-01 NOTE — Progress Notes (Signed)
Patient ID: Gary Grimes, male   DOB: 10-May-1961, 55 y.o.   MRN: [id number] D: Client seen dayroom this evening watching TV. Client reports anxiety and depression "3" of 10. "later part of the day better with increased medications. A: Writer provided emotional support, reviewed medications, administered as ordered. Staff will monitor q89mn for safety. R:Client is safe on the unit, attended group.               a

## 2016-01-01 NOTE — Plan of Care (Signed)
Problem: Diagnosis: Increased Risk For Suicide Attempt Goal: STG-Patient will be able to identify reason for suicidal STG-Patient will be able to identify reason for suicidal ideation  Outcome: Progressing Patient able to identify current stressors, situational in nature.  Problem: Alteration in mood; excessive anxiety as evidenced by: Goal: STG-Pt will report an absence of self-harm thoughts/actions (Patient will report an absence of self-harm thoughts or actions)  Outcome: Progressing Patient denying SI, thoughts to self harm.

## 2016-01-01 NOTE — Progress Notes (Signed)
Patient remains flat, depressed and expressed continued hopelessness over current situation. Less tearful today however. Rates his depression and anxiety both at a 6/10. Denies pain, physical problems however reports poor sleep last night. Patient medicated per orders, support and reassurance given. Discussed with tx team. Denies SI/HI and remains safe on level III obs. Jamie Kato

## 2016-01-01 NOTE — Progress Notes (Signed)
Patient up at NS tearful expressing hopelessness and helplessness. "I'm going to lose everything. That ativan didn't work. I need to go home but I know I'm not ready. I just don't know what to do." Patient medicated with vistaril and motrin for his persistent headache (8/10) as tylenol was ineffective. Ice pack given. Encouraged coping skills, suggested he reach out to son as he states his now ex-gf is unsupportive and angry. He is minimally receptive but did reach out to son. No SI expressed and verbally contracts should that status change. Patient remains safe, will continue to monitor closely. Jamie Kato

## 2016-01-01 NOTE — Progress Notes (Signed)
Patient ID: Gary Grimes, male   DOB: 1961-07-29, 55 y.o.   MRN: [id number] [facility] MD Progress Note  01/01/2016  Gary Grimes  MRN:  [id number] Subjective:   Continues to ruminate about stressors, mainly financial. States his current job does not " come close " to being able to pay for monthly expenditures, and fears bankruptcy, loss of home and vehicle .  Denies medication side effects . Reports insomnia.   Objective: Patient seen , case discussed with staff. Patient remains ruminative about psychosocial stressors, financial difficulties . Tearful when discussing these. He does present with overall improved range of affect, and smiles at times appropriately. Denies medication side effects thus far- on new medication regimen- Prozac/ Remeron.  States he did not sleep well last night, in spite of Remeron. Had been sleeping better on Seroquel . No disruptive or agitated behaviors on unit, going to groups , participating . Patient visible in milieu, day room. No disruptive or agitated behaviors on unit.   Principal Problem: MDD (major depressive disorder), recurrent severe, without psychosis ([place]) Diagnosis:   Patient Active Problem List   Diagnosis Date Noted  . MDD (major depressive disorder), recurrent severe, without psychosis ([place]) [F33.2] 12/27/2015  . Ventral incisional hernia [K43.2] 01/29/2015  . Perirectal abscess [K61.1] 02/18/2012  . Ventral hernia [K43.9] 07/23/2011  . SPONDYLOSIS [M47.9] 09/19/2008  . CERVICALGIA [M54.2] 09/19/2008  . LOW BACK PAIN [M54.5] 09/19/2008   Total Time spent with patient: 20 minutes   Past Psychiatric History: See H & P  Past Medical History:  Past Medical History  Diagnosis Date  . Hypertension   . Levator syndrome 2001    history   . Perirectal abscess   . High triglycerides   . High cholesterol   . Depression     did get seen in er 4/13 for evaluation-psyc  . Arthritis   . Anginal pain ([place])     ER visit 01/14/2014 visit on chart    . Sleep apnea     pt has CPAP machine but does not use due to mask   . Anxiety     pt denies  . GERD (gastroesophageal reflux disease)   . Panic attacks     Past Surgical History  Procedure Laterality Date  . Appendectomy  1984  . Anal fissure repair  08/05/2000    proctoscopy  . Irrigation and debridement abscess  02/18/2012    peri-rectal  . Umbilical hernia repair  10/27/2010  . Shoulder arthroscopy w/ labral repair  08/08/2007    left  . Colonscopy     . Ventral hernia repair N/A 01/29/2015    Procedure: LAPAROSCOPIC VENTRAL HERNIA;  Surgeon: Excell Seltzer, MD;  Location: [facility];  Service: General;  Laterality: N/A;  . Insertion of mesh N/A 01/29/2015    Procedure: INSERTION OF MESH;  Surgeon: Excell Seltzer, MD;  Location: [facility];  Service: General;  Laterality: N/A;   Family History:  Family History  Problem Relation Age of Onset  . 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 Marital Status: Widowed    Spouse Name: N/A  . Number of Children: N/A  . Years of Education: N/A   Social History  Main Topics  . Smoking status: Former Smoker -- 0.50 packs/day for 1 years    Types: Cigarettes    Quit date: 07/06/1977  . Smokeless tobacco: Never Used  . 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 Drug Use: No  . Sexual Activity: Not Asked   Other Topics Concern  . None   Social History Narrative   Additional Social History:    Pain Medications: none Prescriptions:  (N/A) History of alcohol / drug use?: No history of alcohol / drug abuse Longest period of sobriety (when/how long): N/A Negative Consequences of Use: Personal relationships Withdrawal Symptoms:  (denies previous withdrawal)  Sleep:  Fair  Appetite:   improved  Current Medications: Current Facility-Administered Medications  Medication Dose Route Frequency Provider Last Rate Last Dose  . acetaminophen (TYLENOL) tablet 650 mg  650 mg Oral Q6H PRN Benjamine Mola, FNP      . alum & mag hydroxide-simeth (MAALOX/MYLANTA) 200-200-20 MG/5ML suspension 30 mL  30 mL Oral Q4H PRN Benjamine Mola, FNP      . FLUoxetine (PROZAC) capsule 20 mg  20 mg Oral Daily Jenne Campus, MD   20 mg at 01/01/16 0850  . gabapentin (NEURONTIN) capsule 200 mg  200 mg Oral BID Jenne Campus, MD      . hydrOXYzine (ATARAX/VISTARIL) tablet 25 mg  25 mg Oral Q6H PRN Benjamine Mola, FNP   25 mg at 01/01/16 0851  . LORazepam (ATIVAN) tablet 0.5 mg  0.5 mg Oral Q8H PRN Jenne Campus, MD   0.5 mg at 01/01/16 [id number]  . losartan (COZAAR) tablet 100 mg  100 mg Oral QPM Benjamine Mola, FNP   100 mg at 12/31/15 1903  . magnesium hydroxide (MILK OF MAGNESIA) suspension 30 mL  30 mL Oral Daily PRN Benjamine Mola, FNP      . mirtazapine (REMERON) tablet 15 mg  15 mg Oral QHS Myer Peer Desare Duddy, MD      . traZODone (DESYREL) tablet 50 mg  50 mg Oral QHS,MR X 1 Jenne Campus, MD        Lab Results: No results found for this or any previous visit (from the past 48 hour(s)).  Physical Findings: AIMS: Facial and Oral Movements Muscles of Facial Expression: None, normal Lips and Perioral Area: None, normal Jaw: None, normal Tongue: None, normal,Extremity Movements Upper (arms, wrists, hands, fingers): None, normal Lower (legs, knees, ankles, toes): None, normal, Trunk Movements Neck, shoulders, hips: None, normal, Overall Severity Severity of abnormal movements (highest score from questions above): None, normal Incapacitation due to abnormal movements: None, normal Patient's awareness of abnormal movements (rate only patient's report): No Awareness, Dental Status Current problems with teeth and/or dentures?: No Does patient usually wear dentures?: No   CIWA:  CIWA-Ar Total: 1 COWS:  COWS Total Score: 1  Musculoskeletal: Strength & Muscle Tone: within normal limits Gait & Station: normal Patient leans: N/A  Psychiatric Specialty Exam: Review of Systems  Constitutional: Positive for malaise/fatigue.  HENT: Negative.   Eyes: Negative.   Respiratory: Negative.   Cardiovascular: Negative.   Gastrointestinal: Negative.   Genitourinary: Negative.   Musculoskeletal: Positive for back pain.  Skin: Negative.   Neurological: Negative.   Endo/Heme/Allergies: Negative.   Psychiatric/Behavioral: Positive for depression and suicidal ideas. The patient is nervous/anxious and has insomnia.     Blood pressure 119/77, pulse 77, temperature 99.3 F (37.4 C), temperature source Oral, resp.  rate 16, height 6' (1.829 m), weight 270 lb (122.471 kg), SpO2 100 %.Body mass index is 36.61 kg/(m^2).  General Appearance:  Grooming is improved   Eye Contact::  Good  Speech:  Clear and Coherent  Volume:  Decreased  Mood:  Remains depressed   Affect:  Still constricted, more reactive, but still  tearful when discussing stressors   Thought Process:  Coherent and Intact  Orientation:  Full (Time, Place, and Person)  Thought Content:   Ruminations about stressors, no psychotic symptoms  Suicidal Thoughts:   Today denies plan or intention of SI or of hurting himself and contracts for safety on the unit   Homicidal Thoughts:  No  Memory:  Immediate;   Good Recent;   Good Remote;   Good  Judgement:  Fair  Insight:  Fair  Psychomotor Activity:  Normal  Concentration:  Fair  Recall:  [place] of Knowledge:Fair  Language: Good  Akathisia:  No  Handed:  Right  AIMS (if indicated):     Assets:  Communication Skills Desire for Improvement Leisure Time Physical Health Resilience Vocational/Educational  ADL's:  Intact  Cognition: WNL  Sleep:  Number of Hours: 6.5  Assessment - patient continues to present  depressed and ruminative. Range of affect  has improved . Thus far tolerating Prozac/Remeron trial well, but describes increased insomnia off Seroquel.  Treatment Plan Summary: Daily contact with patient to assess and evaluate symptoms and progress in treatment and Medication management Encourage group participation, milieu participation to work on coping skills and symptom reduction. Continue PROZAC 20 mgrs QDAY for depression Increase  REMERON  To 15  mgrs QHS for depression and for insomnia  Start TRAZODONE 50 mgrs QHS for insomnia as needed . Continue  Vistaril 25 mg every six hours as needed for anxiety Decrease Ativan to 0.5 mg every 8 hours  PRN as needed for severe anxiety Continue Cozaar 100 mg daily for Hypertension  -Increase NEURONTIN  To 200 mgrs BID for anxiety/chronic pain    Neita Garnet, MD  01/01/2016, 2:30PM

## 2016-01-01 NOTE — BHH Group Notes (Signed)
[place] LCSW Group Therapy 01/01/2016 1:15 PM  Type of Therapy: Group Therapy- Emotion Regulation  Participation Level: Minimal  Participation Quality:  Reserved  Affect: Flat  Cognitive: Alert and Oriented   Insight:  Improving  Engagement in Therapy: Limited   Modes of Intervention: Clarification, Confrontation, Discussion, Education, Exploration, Limit-setting, Orientation, Problem-solving, Rapport Building, Art therapist, Socialization and Support  Summary of Progress/Problems: The topic for group today was emotional regulation. This group focused on both positive and negative emotion identification and allowed group members to process ways to identify feelings, regulate negative emotions, and find healthy ways to manage internal/external emotions. Group members were asked to reflect on a time when their reaction to an emotion led to a negative outcome and explored how alternative responses using emotion regulation would have benefited them. Group members were also asked to discuss a time when emotion regulation was utilized when a negative emotion was experienced. Pt participated minimally in group discussion. He identified sadness and the accompanying irrational thoughts as difficult to regulate. He remains attentive throughout group discussion.   Peri Maris, [place] 01/01/2016 3:23 PM

## 2016-01-02 MED ORDER — GUAIFENESIN ER 600 MG PO TB12
600.0000 mg | ORAL_TABLET | Freq: Two times a day (BID) | ORAL | Status: DC | PRN
Start: 2016-01-02 — End: 2016-01-03
  Administered 2016-01-02 – 2016-01-03 (×2): 600 mg via ORAL
  Filled 2016-01-02 (×2): qty 1

## 2016-01-02 NOTE — BHH Group Notes (Signed)
[facility] Mental [facility] Association Group Therapy 01/02/2016 1:15pm  Type of Therapy: Mental Health Association Presentation  Participation Level: Active  Participation Quality: Attentive  Affect: Appropriate  Cognitive: Oriented  Insight: Developing/Improving  Engagement in Therapy: Engaged  Modes of Intervention: Discussion, Education and Socialization  Summary of Progress/Problems: Mental Health Association ([place]) Speaker came to talk about his personal journey with substance abuse and addiction. The pt processed ways by which to relate to the speaker. [name] speaker provided handouts and educational information pertaining to groups and services offered by the [facility]. Pt was engaged in speaker's presentation and was receptive to resources provided.    Peri Maris, LCSWA 01/02/2016 1:32 PM

## 2016-01-02 NOTE — Progress Notes (Signed)
Patient ID: Gary Grimes, male   DOB: 12-Feb-1961, 55 y.o.   MRN: [id number] D: Client has visitors this evening, appeared to be very supportive overheard them offering words of encouragement. Client acknowledges "I'm having a very hard time" "I've lost my house and they going to take my car" "my girlfriend left me, I been going with her for 8 years and she left me"  Client sobs as he talks about his loss, ask for antianxiety medication. A: Client encouraged to consider what he does have as he said he has two sons, furniture, etc. Client ask to consider some of the things he maybe able to sell so he can down size. Medications reviewed and administered as ordered. Staff will monitor q10min for safety. R: client is safe on the unit, attended group.

## 2016-01-02 NOTE — Progress Notes (Signed)
Patient continues to have tearful episodes during which time he expresses extreme hopelessness and helplessness. Ruminating about life stressors. States he hates his job and is worried about how he will pay for meds. "I'm just terrified of being homeless." Rates his depression and hopelessness both at a 5/10, anxiety at a 6/10. Spoke with patient regarding available coping skills. Suggested some self talk exercises. SW available and reassured patient [facility] should be able to assist with meds. 1:1 emotional support provided, medicated per orders with ativan prn given. Gentle suggestion made to decrease monthly expenses, downsize housing. Patient responded that a smaller home wouldn't accomodate his nice furniture. Suggested patient may need to sell some of these items and make some of these changes to avoid homelessness. Patient somewhat receptive. Continued to encourage patient to reach out to family and patient states brother has been supportive. Ativan and emotional processing helpful, patient calmer on reassess. Patient continues to deny SI/HI and remains safe on level III obs. Jamie Kato

## 2016-01-02 NOTE — Plan of Care (Signed)
Problem: Alteration in mood Goal: LTG-Pt's behavior demonstrates decreased signs of depression (Patient's behavior demonstrates decreased signs of depression to the point the patient is safe to return home and continue treatment in an outpatient setting)  Outcome: Progressing Client reports decreased symptoms of depression AEB reporting depression at "3" of 10. Client also participating in groups.

## 2016-01-02 NOTE — Plan of Care (Signed)
Problem: Diagnosis: Increased Risk For Suicide Attempt Goal: STG-Patient Will Report Suicidal Feelings to Staff Outcome: Progressing Patient denies SI, thoughts to harm self.  Problem: Ineffective individual coping Goal: STG: Pt will be able to identify effective and ineffective STG: Pt will be able to identify effective and ineffective coping patterns  Outcome: Not Progressing Patient having difficulty using coping skills however was receptive to teaching.

## 2016-01-02 NOTE — Progress Notes (Addendum)
Patient ID: JAYCE GUARDIA, male   DOB: 28-Apr-1961, 55 y.o.   MRN: [id number] [facility] MD Progress Note  01/02/2016  THIELEN SHELHAMER  MRN:  [id number] Subjective:   Patient reports modest improvement but continues to report depression, and continues to ruminate about his significant psychosocial stressors- these revolve around financial difficulties, concerns about bankruptcy, feeling unhappy in his current job, recent break up with GF.Marland Kitchen Denies medication side effects   Objective: Patient seen , case discussed with staff. Patient presents with some improvement- appears better groomed, and although still ruminative, presents somewhat better able to discuss options such as adapting to decreased income, trying to seek other sources for help. He expresses some interest in having a meeting with his GF and /or brother, but is unsure if they would come . Will speak with CSW . No disruptive or agitated behaviors on unit. He has been going to some  groups . No medication side effects- currently on Prozac and Remeron. Also, reports sleep partially improved on Trazodone  Principal Problem: MDD (major depressive disorder), recurrent severe, without psychosis ([place]) Diagnosis:   Patient Active Problem List   Diagnosis Date Noted  . MDD (major depressive disorder), recurrent severe, without psychosis ([place]) [F33.2] 12/27/2015  . Ventral incisional hernia [K43.2] 01/29/2015  . Perirectal abscess [K61.1] 02/18/2012  . Ventral hernia [K43.9] 07/23/2011  . SPONDYLOSIS [M47.9] 09/19/2008  . CERVICALGIA [M54.2] 09/19/2008  . LOW BACK PAIN [M54.5] 09/19/2008   Total Time spent with patient: 20 minutes   Past Psychiatric History: See H & P  Past Medical History:  Past Medical History  Diagnosis Date  . Hypertension   . Levator syndrome 2001    history   . Perirectal abscess   . High triglycerides   . High cholesterol   . Depression     did get seen in er 4/13 for evaluation-psyc  . Arthritis   . Anginal  pain ([place])     ER visit 01/14/2014 visit on chart   . Sleep apnea     pt has CPAP machine but does not use due to mask   . Anxiety     pt denies  . GERD (gastroesophageal reflux disease)   . Panic attacks     Past Surgical History  Procedure Laterality Date  . Appendectomy  1984  . Anal fissure repair  08/05/2000    proctoscopy  . Irrigation and debridement abscess  02/18/2012    peri-rectal  . Umbilical hernia repair  10/27/2010  . Shoulder arthroscopy w/ labral repair  08/08/2007    left  . Colonscopy     . Ventral hernia repair N/A 01/29/2015    Procedure: LAPAROSCOPIC VENTRAL HERNIA;  Surgeon: Excell Seltzer, MD;  Location: [facility];  Service: General;  Laterality: N/A;  . Insertion of mesh N/A 01/29/2015    Procedure: INSERTION OF MESH;  Surgeon: Excell Seltzer, MD;  Location: [facility];  Service: General;  Laterality: N/A;   Family History:  Family History  Problem Relation Age of Onset  . 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 Marital Status: Widowed    Spouse Name: N/A  . Number of Children: N/A  . Years of Education: N/A  Social History Main Topics  . Smoking status: Former Smoker -- 0.50 packs/day for 1 years    Types: Cigarettes    Quit date: 07/06/1977  . Smokeless tobacco: Never Used  . 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 Drug Use: No  . Sexual Activity: Not Asked   Other Topics Concern  . None   Social History Narrative   Additional Social History:    Pain Medications: none Prescriptions:  (N/A) History of alcohol / drug use?: No history of alcohol / drug abuse Longest period of sobriety (when/how long): N/A Negative Consequences of Use: Personal relationships Withdrawal  Symptoms:  (denies previous withdrawal)  Sleep: Fair  Appetite:   improved  Current Medications: Current Facility-Administered Medications  Medication Dose Route Frequency Provider Last Rate Last Dose  . acetaminophen (TYLENOL) tablet 650 mg  650 mg Oral Q6H PRN Benjamine Mola, FNP   650 mg at 01/01/16 1314  . alum & mag hydroxide-simeth (MAALOX/MYLANTA) 200-200-20 MG/5ML suspension 30 mL  30 mL Oral Q4H PRN Benjamine Mola, FNP      . FLUoxetine (PROZAC) capsule 20 mg  20 mg Oral Daily Jenne Campus, MD   20 mg at 01/02/16 0808  . gabapentin (NEURONTIN) capsule 200 mg  200 mg Oral BID Jenne Campus, MD   200 mg at 01/02/16 1550  . guaiFENesin (MUCINEX) 12 hr tablet 600 mg  600 mg Oral BID PRN Jenne Campus, MD   600 mg at 01/02/16 1549  . hydrOXYzine (ATARAX/VISTARIL) tablet 25 mg  25 mg Oral Q6H PRN Benjamine Mola, FNP   25 mg at 01/02/16 1437  . ibuprofen (ADVIL,MOTRIN) tablet 400 mg  400 mg Oral Q6H PRN Encarnacion Slates, NP   400 mg at 01/01/16 1539  . LORazepam (ATIVAN) tablet 0.5 mg  0.5 mg Oral Q8H PRN Jenne Campus, MD   0.5 mg at 01/02/16 1112  . losartan (COZAAR) tablet 100 mg  100 mg Oral QPM Benjamine Mola, FNP   100 mg at 01/01/16 1718  . magnesium hydroxide (MILK OF MAGNESIA) suspension 30 mL  30 mL Oral Daily PRN Benjamine Mola, FNP      . mirtazapine (REMERON) tablet 15 mg  15 mg Oral QHS Jenne Campus, MD   15 mg at 01/01/16 2125  . traZODone (DESYREL) tablet 50 mg  50 mg Oral QHS,MR X 1 Jenne Campus, MD   50 mg at 01/01/16 2125    Lab Results: No results found for this or any previous visit (from the past 48 hour(s)).  Physical Findings: AIMS: Facial and Oral Movements Muscles of Facial Expression: None, normal Lips and Perioral Area: None, normal Jaw: None, normal Tongue: None, normal,Extremity Movements Upper (arms, wrists, hands, fingers): None, normal Lower (legs, knees, ankles, toes): None, normal, Trunk Movements Neck, shoulders, hips: None,  normal, Overall Severity Severity of abnormal movements (highest score from questions above): None, normal Incapacitation due to abnormal movements: None, normal Patient's awareness of abnormal movements (rate only patient's report): No Awareness, Dental Status Current problems with teeth and/or dentures?: No Does patient usually wear dentures?: No  CIWA:  CIWA-Ar Total: 1 COWS:  COWS Total Score: 1  Musculoskeletal: Strength & Muscle Tone: within normal limits Gait & Station: normal Patient leans: N/A  Psychiatric Specialty Exam: Review of Systems  Constitutional: Positive for malaise/fatigue.  HENT: Negative.   Eyes: Negative.   Respiratory: Negative.  Cardiovascular: Negative.   Gastrointestinal: Negative.   Genitourinary: Negative.   Musculoskeletal: Positive for back pain.  Skin: Negative.   Neurological: Negative.   Endo/Heme/Allergies: Negative.   Psychiatric/Behavioral: Positive for depression and suicidal ideas. The patient is nervous/anxious and has insomnia.   Reports feeling " congested". No coughing, no SOB, no fever   Blood pressure 128/79, pulse 83, temperature 97.7 F (36.5 C), temperature source Oral, resp. rate 18, height 6' (1.829 m), weight 270 lb (122.471 kg), SpO2 100 %.Body mass index is 36.61 kg/(m^2).  General Appearance:  Grooming is improved   Eye Contact::  Good  Speech:  Clear and Coherent  Volume:  Decreased  Mood:  Still depressed, but improved   Affect:  Constricted, but  more reactive   Thought Process:  Coherent and Intact  Orientation:  Full (Time, Place, and Person)  Thought Content:   Ruminations about stressors, no psychotic symptoms  Suicidal Thoughts:   Denies  SI, denies current plans or intentions of  hurting himself and contracts for safety on the unit at this time  Homicidal Thoughts:  No  Memory:  Immediate;   Good Recent;   Good Remote;   Good  Judgement:  Improving   Insight:  Fair  Psychomotor Activity:  Normal   Concentration:  Good  Recall:  Good  Fund of Knowledge:Good  Language: Good  Akathisia:  No  Handed:  Right  AIMS (if indicated):     Assets:  Communication Skills Desire for Improvement Leisure Time Physical Health Resilience Vocational/Educational  ADL's:  Intact  Cognition: WNL  Sleep:  Number of Hours: 6  Assessment -  Patient is partially improved compared to admission , but continues to present with ruminations about his severe psychosocial stressors. Although depressed, seems less hopeless and better able to be future oriented. He is tolerating medications well. Reports some degree of congestion secondary to " having a cold "- of note, no fever, no systemic symptoms. Treatment Plan Summary: Daily contact with patient to assess and evaluate symptoms and progress in treatment and Medication management Encourage group participation, milieu participation to work on coping skills and symptom reduction. Continue PROZAC 20 mgrs QDAY for depression Continue  REMERON   15  mgrs QHS for depression and for insomnia  Continue TRAZODONE 50 mgrs QHS for insomnia as needed . Continue  Vistaril 25 mg every six hours as needed for anxiety Continue  Ativan  0.5 mg every 8 hours  PRN as needed for severe anxiety Continue Cozaar 100 mg daily for Hypertension  Continue NEURONTIN  200 mgrs BID for anxiety/chronic pain  Patient expressing some interest in family meeting, but unsure if family/GF would attend- have reviewed this with CSW, will follow. Mucinex  PRN BID for congestion as needed   Neita Garnet, MD  01/02/2016, 2:30PM

## 2016-01-02 NOTE — Progress Notes (Addendum)
Adult Psychoeducational Group Note  Date:  01/02/2016 Time:  10:08 PM  Group Topic/Focus:  Wrap-Up Group:   The focus of this group is to help patients review their daily goal of treatment and discuss progress on daily workbooks.  Participation Level:  Active  Participation Quality:  Appropriate  Affect:  Appropriate  Cognitive:  Alert  Insight: Appropriate  Engagement in Group:  Engaged  Modes of Intervention:  Discussion  Additional Comments:  Patient stated he didn't have a good day. Patient stated his goal for today was to attend all groups.  Lurie Mullane L Debbie Yearick 01/02/2016, 10:08 PM

## 2016-01-03 MED ORDER — TRAZODONE HCL 100 MG PO TABS
100.0000 mg | ORAL_TABLET | Freq: Every evening | ORAL | Status: DC | PRN
  Administered 2016-01-03 – 2016-01-05 (×4): 100 mg via ORAL
  Filled 2016-01-03: qty 1
  Filled 2016-01-03: qty 14
  Filled 2016-01-03 (×2): qty 1
  Filled 2016-01-03: qty 14
  Filled 2016-01-03 (×6): qty 1

## 2016-01-03 MED ORDER — ALBUTEROL SULFATE (2.5 MG/3ML) 0.083% IN NEBU
2.5000 mg | INHALATION_SOLUTION | RESPIRATORY_TRACT | Status: DC
  Filled 2016-01-03 (×4): qty 3

## 2016-01-03 MED ORDER — ALBUTEROL SULFATE (2.5 MG/3ML) 0.083% IN NEBU
2.5000 mg | INHALATION_SOLUTION | Freq: Four times a day (QID) | RESPIRATORY_TRACT | Status: DC | PRN
  Administered 2016-01-03 – 2016-01-07 (×7): 2.5 mg via RESPIRATORY_TRACT
  Filled 2016-01-03 (×3): qty 3

## 2016-01-03 MED ORDER — GUAIFENESIN ER 600 MG PO TB12
1200.0000 mg | ORAL_TABLET | Freq: Two times a day (BID) | ORAL | Status: DC | PRN
  Administered 2016-01-03 – 2016-01-08 (×10): 1200 mg via ORAL
  Filled 2016-01-03 (×9): qty 2

## 2016-01-03 NOTE — Progress Notes (Signed)
D) Pt. Rated his depression as a 5, hopelessness as a 9 and his anxiety as an 8. Checked the box that he had thoughts of suicide in the last 24 hours and also wrote on the bottom of the sheet "really struggling". In a 1:1 Pt. Stated that he felt hopeless and didn't know what he was going to do. "I have lost everything: my car, my girlfriend. Everything".  A) Provided Pt with a 1:1  Given support. Gentle confrontation with what Pt has control over and what he doesn't. What he does have control over, Pt is throwing away. Made aware that there are numerous jobs for him to have and that there are numerous females that he could be friends with. Pt contracts for his safety while on the unit R) Pt contracting for his safety while on the unit.

## 2016-01-03 NOTE — Tx Team (Signed)
Interdisciplinary Treatment Plan Update (Adult) Date: 01/03/2016   Date: 01/03/2016 1:19 PM  Progress in Treatment:  Attending groups: Yes  Participating in groups: Yes  Taking medication as prescribed: Yes  Tolerating medication: Yes  Family/Significant othe contact made: Yes with son Patient understands diagnosis: Yes AEB seeking help with depression Discussing patient identified problems/goals with staff: Yes  Medical problems stabilized or resolved: Yes  Denies suicidal/homicidal ideation: No endorses passive SI Patient has not harmed self or Others: Yes   New problem(s) identified: None identified at this time.   Discharge Plan or Barriers: Pt will discharge home and follow-up with [facility].  Additional comments:  Patient and CSW reviewed pt's identified goals and treatment plan. Patient verbalized understanding and agreed to treatment plan. CSW reviewed [facility] "Discharge Process and Patient Involvement" Form. Pt verbalized understanding of information provided and signed form.   Reason for Continuation of Hospitalization:  Anxiety Depression Medication stabilization Suicidal ideation  Estimated length of stay: 2-3 days  Review of initial/current patient goals per problem list:   1.  Goal(s): Patient will participate in aftercare plan  Met:  Yes  Target date: 3-5 days from date of admission   As evidenced by: Patient will participate within aftercare plan AEB aftercare provider and housing plan at discharge being identified.   12/30/15: Pt will DC home and follow-up with [facility]  2.  Goal (s): Patient will exhibit decreased depressive symptoms and suicidal ideations.  Met:  No  Target date: 3-5 days from date of admission   As evidenced by: Patient will utilize self rating of depression at 3 or below and demonstrate decreased signs of depression or be deemed stable for discharge by MD.  12/30/15: Pt rates depression at 9/10; endorses passive SI  01/03/16: Pt continues to rate  depression at high rate; endorses passive SI  3.  Goal(s): Patient will demonstrate decreased signs and symptoms of anxiety.  Met:  No  Target date: 3-5 days from date of admission   As evidenced by: Patient will utilize self rating of anxiety at 3 or below and demonstrated decreased signs of anxiety, or be deemed stable for discharge by MD  12/30/15: Pt rates anxiety at 9/10; appears withdrawn in the milieu.  01/03/16: Pt continues to endorse anxiety at high rates.  Attendees:  Patient:    Family:    Physician: Dr. [name] Heck, MD  01/03/2016 1:19 PM  Nursing: Lars Pinks, RN Case manager  01/03/2016 1:19 PM  Clinical Social Worker Peri Maris, [place] 01/03/2016 1:19 PM  Other: ,  01/03/2016 1:19 PM  Clinical: Bryson Dames, RN 01/03/2016 1:19 PM  Other: , RN Charge Nurse 01/03/2016 1:19 PM  Other:     Peri Maris, [place] Social Work [phone]

## 2016-01-03 NOTE — BHH Group Notes (Signed)
[facility] LCSW Aftercare Discharge Planning Group Note  01/03/2016 8:45 AM  Pt did not attend, declined invitation.   Peri Maris, [place] 01/03/2016 1:18 PM

## 2016-01-03 NOTE — BHH Group Notes (Signed)
Pt attended wrap up group.  Pt stated his day started out rough but it got better once he met with the chaplin.  His goal was to attend all the groups which he did.  He stated he may discharge next week.  Victorino Sparrow, MHT

## 2016-01-03 NOTE — Progress Notes (Addendum)
[facility] MD Progress Note  01/03/2016  Gary Grimes  MRN:  [id number]  Subjective:  Concerned about sleep.  He reported slight improvement but would like to try increased dose.  Still reporting feeling hopeless. Denies medication side effects.   Objective: Patient seen , case discussed with staff. No disruptive or agitated behaviors on unit. He has been going to some  groups per nursing. No medication side effects- currently on Prozac and Remeron. Also, reports sleep partially improved on Trazodone but still lacking.  Increased trazodone dose.  Principal Problem: MDD (major depressive disorder), recurrent severe, without psychosis ([place]) Diagnosis:   Patient Active Problem List   Diagnosis Date Noted  . MDD (major depressive disorder), recurrent severe, without psychosis ([place]) [F33.2] 12/27/2015  . Ventral incisional hernia [K43.2] 01/29/2015  . Perirectal abscess [K61.1] 02/18/2012  . Ventral hernia [K43.9] 07/23/2011  . SPONDYLOSIS [M47.9] 09/19/2008  . CERVICALGIA [M54.2] 09/19/2008  . LOW BACK PAIN [M54.5] 09/19/2008   Total Time spent with patient: 20 minutes   Past Psychiatric History: See H & P  Past Medical History:  Past Medical History  Diagnosis Date  . Hypertension   . Levator syndrome 2001    history   . Perirectal abscess   . High triglycerides   . High cholesterol   . Depression     did get seen in er 4/13 for evaluation-psyc  . Arthritis   . Anginal pain ([place])     ER visit 01/14/2014 visit on chart   . Sleep apnea     pt has CPAP machine but does not use due to mask   . Anxiety     pt denies  . GERD (gastroesophageal reflux disease)   . Panic attacks     Past Surgical History  Procedure Laterality Date  . Appendectomy  1984  . Anal fissure repair  08/05/2000    proctoscopy  . Irrigation and debridement abscess  02/18/2012    peri-rectal  . Umbilical hernia repair  10/27/2010  . Shoulder arthroscopy w/ labral repair  08/08/2007    left  . Colonscopy      . Ventral hernia repair N/A 01/29/2015    Procedure: LAPAROSCOPIC VENTRAL HERNIA;  Surgeon: Excell Seltzer, MD;  Location: [facility];  Service: General;  Laterality: N/A;  . Insertion of mesh N/A 01/29/2015    Procedure: INSERTION OF MESH;  Surgeon: Excell Seltzer, MD;  Location: [facility];  Service: General;  Laterality: N/A;   Family History:  Family History  Problem Relation Age of Onset  . 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 Marital Status: Widowed    Spouse Name: N/A  . Number of Children: N/A  . Years of Education: N/A   Social History Main Topics  . Smoking status: Former Smoker -- 0.50 packs/day for 1 years    Types: Cigarettes    Quit date: 07/06/1977  . Smokeless tobacco: Never Used  . 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 Drug Use: No  . Sexual Activity: Not Asked   Other Topics Concern  . None   Social History Narrative   Additional Social History:  Pain Medications: none Prescriptions:  (N/A) History of alcohol / drug use?: No history of alcohol / drug abuse Longest period of sobriety (when/how long): N/A Negative Consequences of Use: Personal relationships Withdrawal Symptoms:  (denies previous withdrawal)  Sleep: Fair  Appetite:   improved  Current Medications: Current Facility-Administered Medications  Medication Dose Route Frequency Provider Last Rate Last Dose  . acetaminophen (TYLENOL) tablet 650 mg  650 mg Oral Q6H PRN Benjamine Mola, FNP   650 mg at 01/01/16 1314  . albuterol (PROVENTIL) (2.5 MG/3ML) 0.083% nebulizer solution 2.5 mg  2.5 mg Nebulization Q6H PRN Kerrie Buffalo, NP      . alum & mag hydroxide-simeth (MAALOX/MYLANTA) 200-200-20 MG/5ML suspension 30 mL   30 mL Oral Q4H PRN Benjamine Mola, FNP      . FLUoxetine (PROZAC) capsule 20 mg  20 mg Oral Daily Jenne Campus, MD   20 mg at 01/03/16 0736  . gabapentin (NEURONTIN) capsule 200 mg  200 mg Oral BID Jenne Campus, MD   200 mg at 01/03/16 1648  . guaiFENesin (MUCINEX) 12 hr tablet 1,200 mg  1,200 mg Oral BID PRN Kerrie Buffalo, NP      . hydrOXYzine (ATARAX/VISTARIL) tablet 25 mg  25 mg Oral Q6H PRN Benjamine Mola, FNP   25 mg at 01/02/16 1437  . ibuprofen (ADVIL,MOTRIN) tablet 400 mg  400 mg Oral Q6H PRN Encarnacion Slates, NP   400 mg at 01/03/16 1303  . LORazepam (ATIVAN) tablet 0.5 mg  0.5 mg Oral Q8H PRN Jenne Campus, MD   0.5 mg at 01/03/16 1303  . losartan (COZAAR) tablet 100 mg  100 mg Oral QPM Benjamine Mola, FNP   100 mg at 01/03/16 1648  . magnesium hydroxide (MILK OF MAGNESIA) suspension 30 mL  30 mL Oral Daily PRN Benjamine Mola, FNP      . mirtazapine (REMERON) tablet 15 mg  15 mg Oral QHS Jenne Campus, MD   15 mg at 01/02/16 2135  . traZODone (DESYREL) tablet 100 mg  100 mg Oral QHS,MR X 1 Kerrie Buffalo, NP        Lab Results: No results found for this or any previous visit (from the past 48 hour(s)).  Physical Findings: AIMS: Facial and Oral Movements Muscles of Facial Expression: None, normal Lips and Perioral Area: None, normal Jaw: None, normal Tongue: None, normal,Extremity Movements Upper (arms, wrists, hands, fingers): None, normal Lower (legs, knees, ankles, toes): None, normal, Trunk Movements Neck, shoulders, hips: None, normal, Overall Severity Severity of abnormal movements (highest score from questions above): None, normal Incapacitation due to abnormal movements: None, normal Patient's awareness of abnormal movements (rate only patient's report): No Awareness, Dental Status Current problems with teeth and/or dentures?: No Does patient usually wear dentures?: No  CIWA:  CIWA-Ar Total: 1 COWS:  COWS Total Score: 1  Musculoskeletal: Strength &  Muscle Tone: within normal limits Gait & Station: normal Patient leans: N/A  Psychiatric Specialty Exam: Review of Systems  Constitutional: Positive for malaise/fatigue.  HENT: Negative.   Eyes: Negative.   Respiratory: Positive for cough.   Cardiovascular: Negative.   Gastrointestinal: Negative.   Genitourinary: Negative.   Musculoskeletal: Positive for back pain.  Skin: Negative.   Neurological: Negative.   Endo/Heme/Allergies: Negative.   Psychiatric/Behavioral: Positive for depression. The patient is nervous/anxious and has insomnia.   Reports feeling " congested". No coughing, no SOB, no fever   Blood pressure 127/79, pulse 97, temperature 98.9 F (  37.2 C), temperature source Oral, resp. rate 16, height 6' (1.829 m), weight 122.471 kg (270 lb), SpO2 100 %.Body mass index is 36.61 kg/(m^2).  General Appearance:  Neat  Eye Contact::  Good  Speech:  Clear and Coherent  Volume:  Decreased  Mood:  Still depressed, but improved   Affect:  Constricted, but  more reactive   Thought Process:  Coherent and Intact  Orientation:  Full (Time, Place, and Person)  Thought Content:   Ruminations about stressors, no psychotic symptoms  Suicidal Thoughts:   Denies  SI, denies current plans or intentions of  hurting himself and contracts for safety on the unit at this time  Homicidal Thoughts:  No  Memory:  Immediate;   Good Recent;   Good Remote;   Good  Judgement:  Improving   Insight:  Fair  Psychomotor Activity:  Normal  Concentration:  Good  Recall:  Good  Fund of Knowledge:Good  Language: Good  Akathisia:  No  Handed:  Right  AIMS (if indicated):     Assets:  Communication Skills Desire for Improvement Leisure Time Physical Health Resilience Vocational/Educational  ADL's:  Intact  Cognition: WNL  Sleep:  Number of Hours: 6.75  Assessment -  Patient is partially improved compared to admission , but continues to present with ruminations about his severe psychosocial  stressors. Although depressed, seems less hopeless and better able to be future oriented. He is tolerating medications well. Reports some degree of congestion secondary to " having a cold "- of note, no fever, no systemic symptoms.  Treatment Plan Summary: Daily contact with patient to assess and evaluate symptoms and progress in treatment and Medication management Encourage group participation, milieu participation to work on coping skills and symptom reduction. Continue PROZAC 20 mgrs QDAY for depression Continue  REMERON   15  mgrs QHS for depression and for insomnia  Increased TRAZODONE 100 mgrs QHS for insomnia as needed . Continue  Vistaril 25 mg every six hours as needed for anxiety Continue  Ativan  0.5 mg every 8 hours  PRN as needed for severe anxiety Continue Cozaar 100 mg daily for Hypertension  Continue NEURONTIN  200 mgrs BID for anxiety/chronic pain  Patient expressing some interest in family meeting, but unsure if family/GF would attend- have reviewed this with CSW, will follow. Mucinex  PRN BID for congestion as needed   [place] AGNP-BC 01/03/2016, 2:30PM  Reviewed the information documented and agree with the treatment plan.  Gary Grimes,Gary R. 01/05/2016 11:46 AM

## 2016-01-03 NOTE — Progress Notes (Signed)
D: Client affect brighter today, reports of day "got better in the afternoon, after I talked to chaplin" Client complains of congestion. A: Writer administered medications and albuterol inhaler. Client encouraged to continue spiritual quest as he finds solace. Staff will monitor q53min for safety. R: client is safe on the unit, attended group.

## 2016-01-03 NOTE — BHH Group Notes (Signed)
[facility] LCSW Group Therapy 01/03/2016 1:15pm  Type of Therapy: Group Therapy- Feelings Around Relapse and Recovery  Participation Level: Active   Participation Quality:  Appropriate  Affect:  Tearful  Cognitive: Alert and Oriented   Insight:  Developing   Engagement in Therapy: Developing/Improving and Engaged   Modes of Intervention: Clarification, Confrontation, Discussion, Education, Exploration, Limit-setting, Orientation, Problem-solving, Rapport Building, Art therapist, Socialization and Support  Summary of Progress/Problems: The topic for today was feelings about relapse. The group discussed what relapse prevention is to them and identified triggers that they are on the path to relapse. Members also processed their feeling towards relapse and were able to relate to common experiences. Group also discussed coping skills that can be used for relapse prevention.  Pt observed to be more active in group discussion today and began to express painful emotions related to financial and relationship stressors. Although Pt was more active in discussion, he continues to ruminate over financial situation and insist that there is no option to fix the situation. He also expresses that he is "stuck." Pt nods his head as if he is receptive to feedback and encouragement but follows this with more comments about his helplessness.   Therapeutic Modalities:   Cognitive Behavioral Therapy Solution-Focused Therapy Assertiveness Training Relapse Prevention Therapy    Norman Clay [phone] 01/03/2016 4:03 PM

## 2016-01-04 MED ORDER — BENZONATATE 100 MG PO CAPS
100.0000 mg | ORAL_CAPSULE | Freq: Three times a day (TID) | ORAL | Status: DC | PRN
  Administered 2016-01-04 – 2016-01-07 (×5): 100 mg via ORAL
  Filled 2016-01-04 (×5): qty 1

## 2016-01-04 NOTE — BHH Group Notes (Signed)
Adult Therapy Group Note  Date:  01/04/2016 Time:  1:45-2:45 PM  Group Topic/Focus:  Managing Feelings:   The focus of this group was to identify what feelings patients have difficulty handling and develop a plan to handle them in a healthier way upon discharge.  Focus was on anger outbursts and an Anger Evaluation tool was used to elicit thought and motivate toward change.  Participation Level:  Active  Participation Quality:  Attentive  Affect:  Depressed and Flat  Cognitive:  Appropriate  Insight: Good  Engagement in Group:  Developing/Improving  Modes of Intervention:  Discussion and Motivational Interviewing  Additional Comments:  Gary Grimes participated minimally in group, seemed to be bored and did not want to think about the questions being asked.  Lysle Dingwall 01/04/2016, 3:46 PM

## 2016-01-04 NOTE — Progress Notes (Signed)
[facility] MD Progress Note  01/04/2016  Gary Grimes  MRN:  [id number] Subjective:   Concerned about sleep.  He reported slight improvement but would like to try increased dose.  Still reporting feeling hopeless. Denies medication side effects.   Objective: Patient seen , case discussed with staff. No disruptive or agitated behaviors on unit. He has been going to some  groups per nursing. No medication side effects- currently on Prozac and Remeron. Also, reports sleep partially improved on Trazodone but still lacking.  Increased trazodone dose.  Principal Problem: MDD (major depressive disorder), recurrent severe, without psychosis ([place]) Diagnosis:   Patient Active Problem List   Diagnosis Date Noted  . MDD (major depressive disorder), recurrent severe, without psychosis ([place]) [F33.2] 12/27/2015  . Ventral incisional hernia [K43.2] 01/29/2015  . Perirectal abscess [K61.1] 02/18/2012  . Ventral hernia [K43.9] 07/23/2011  . SPONDYLOSIS [M47.9] 09/19/2008  . CERVICALGIA [M54.2] 09/19/2008  . LOW BACK PAIN [M54.5] 09/19/2008   Total Time spent with patient: 20 minutes   Past Psychiatric History: See H & P  Past Medical History:  Past Medical History  Diagnosis Date  . Hypertension   . Levator syndrome 2001    history   . Perirectal abscess   . High triglycerides   . High cholesterol   . Depression     did get seen in er 4/13 for evaluation-psyc  . Arthritis   . Anginal pain ([place])     ER visit 01/14/2014 visit on chart   . Sleep apnea     pt has CPAP machine but does not use due to mask   . Anxiety     pt denies  . GERD (gastroesophageal reflux disease)   . Panic attacks     Past Surgical History  Procedure Laterality Date  . Appendectomy  1984  . Anal fissure repair  08/05/2000    proctoscopy  . Irrigation and debridement abscess  02/18/2012    peri-rectal  . Umbilical hernia repair  10/27/2010  . Shoulder arthroscopy w/ labral repair  08/08/2007    left  . Colonscopy      . Ventral hernia repair N/A 01/29/2015    Procedure: LAPAROSCOPIC VENTRAL HERNIA;  Surgeon: Excell Seltzer, MD;  Location: [facility];  Service: General;  Laterality: N/A;  . Insertion of mesh N/A 01/29/2015    Procedure: INSERTION OF MESH;  Surgeon: Excell Seltzer, MD;  Location: [facility];  Service: General;  Laterality: N/A;   Family History:  Family History  Problem Relation Age of Onset  . 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 Marital Status: Widowed    Spouse Name: N/A  . Number of Children: N/A  . Years of Education: N/A   Social History Main Topics  . Smoking status: Former Smoker -- 0.50 packs/day for 1 years    Types: Cigarettes    Quit date: 07/06/1977  . Smokeless tobacco: Never Used  . 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 Drug Use: No  . Sexual Activity: Not Asked   Other Topics Concern  . None   Social History Narrative   Additional Social History:  Pain Medications: none Prescriptions:  (N/A) History of alcohol / drug use?: No history of alcohol / drug abuse Longest period of sobriety (when/how long): N/A Negative Consequences of Use: Personal relationships Withdrawal Symptoms:  (denies previous withdrawal)  Sleep: Fair  Appetite:   improved  Current Medications: Current Facility-Administered Medications  Medication Dose Route Frequency Provider Last Rate Last Dose  . acetaminophen (TYLENOL) tablet 650 mg  650 mg Oral Q6H PRN Benjamine Mola, FNP   650 mg at 01/01/16 1314  . albuterol (PROVENTIL) (2.5 MG/3ML) 0.083% nebulizer solution 2.5 mg  2.5 mg Nebulization Q6H PRN Kerrie Buffalo, NP   2.5 mg at 01/03/16 2046  . alum & mag hydroxide-simeth (MAALOX/MYLANTA) 200-200-20  MG/5ML suspension 30 mL  30 mL Oral Q4H PRN Benjamine Mola, FNP      . FLUoxetine (PROZAC) capsule 20 mg  20 mg Oral Daily Jenne Campus, MD   20 mg at 01/04/16 0825  . gabapentin (NEURONTIN) capsule 200 mg  200 mg Oral BID Jenne Campus, MD   200 mg at 01/04/16 0825  . guaiFENesin (MUCINEX) 12 hr tablet 1,200 mg  1,200 mg Oral BID PRN Kerrie Buffalo, NP   1,200 mg at 01/04/16 0827  . hydrOXYzine (ATARAX/VISTARIL) tablet 25 mg  25 mg Oral Q6H PRN Benjamine Mola, FNP   25 mg at 01/02/16 1437  . ibuprofen (ADVIL,MOTRIN) tablet 400 mg  400 mg Oral Q6H PRN Encarnacion Slates, NP   400 mg at 01/03/16 1303  . LORazepam (ATIVAN) tablet 0.5 mg  0.5 mg Oral Q8H PRN Jenne Campus, MD   0.5 mg at 01/03/16 1303  . losartan (COZAAR) tablet 100 mg  100 mg Oral QPM Benjamine Mola, FNP   100 mg at 01/03/16 1648  . magnesium hydroxide (MILK OF MAGNESIA) suspension 30 mL  30 mL Oral Daily PRN Benjamine Mola, FNP      . mirtazapine (REMERON) tablet 15 mg  15 mg Oral QHS Jenne Campus, MD   15 mg at 01/03/16 2202  . traZODone (DESYREL) tablet 100 mg  100 mg Oral QHS,MR X 1 Kerrie Buffalo, NP   100 mg at 01/03/16 2202    Lab Results: No results found for this or any previous visit (from the past 48 hour(s)).  Physical Findings: AIMS: Facial and Oral Movements Muscles of Facial Expression: None, normal Lips and Perioral Area: None, normal Jaw: None, normal Tongue: None, normal,Extremity Movements Upper (arms, wrists, hands, fingers): None, normal Lower (legs, knees, ankles, toes): None, normal, Trunk Movements Neck, shoulders, hips: None, normal, Overall Severity Severity of abnormal movements (highest score from questions above): None, normal Incapacitation due to abnormal movements: None, normal Patient's awareness of abnormal movements (rate only patient's report): No Awareness, Dental Status Current problems with teeth and/or dentures?: No Does patient usually wear dentures?: No  CIWA:  CIWA-Ar  Total: 1 COWS:  COWS Total Score: 1  Musculoskeletal: Strength & Muscle Tone: within normal limits Gait & Station: normal Patient leans: N/A  Psychiatric Specialty Exam: Review of Systems  Constitutional: Positive for malaise/fatigue.  Eyes: Negative.   Respiratory: Negative.   Cardiovascular: Negative.   Gastrointestinal: Negative.   Genitourinary: Negative.   Musculoskeletal: Positive for back pain.  Skin: Negative.   Neurological: Positive for headaches.  Endo/Heme/Allergies: Negative.   Psychiatric/Behavioral: Positive for depression. The patient is nervous/anxious.   Reports feeling " congested". No coughing, no SOB, no fever   Blood pressure 120/75, pulse 85, temperature 99.3  F (37.4 C), temperature source Oral, resp. rate 16, height 6' (1.829 m), weight 122.471 kg (270 lb), SpO2 97 %.Body mass index is 36.61 kg/(m^2).  General Appearance:  Neat  Eye Contact::  Good  Speech:  Clear and Coherent  Volume:  Decreased  Mood:  Still depressed, but improved   Affect:  Constricted, but  more reactive   Thought Process:  Coherent and Intact  Orientation:  Full (Time, Place, and Person)  Thought Content:   Ruminations about stressors, no psychotic symptoms  Suicidal Thoughts:   Denies  SI, denies current plans or intentions of  hurting himself and contracts for safety on the unit at this time  Homicidal Thoughts:  No  Memory:  Immediate;   Good Recent;   Good Remote;   Good  Judgement:  Improving   Insight:  Fair  Psychomotor Activity:  Normal  Concentration:  Good  Recall:  Good  Fund of Knowledge:Good  Language: Good  Akathisia:  No  Handed:  Right  AIMS (if indicated):     Assets:  Communication Skills Desire for Improvement Leisure Time Physical Health Resilience Vocational/Educational  ADL's:  Intact  Cognition: WNL  Sleep:  Number of Hours: 6.25  Assessment -  Patient is partially improved compared to admission , but continues to present with ruminations  about his severe psychosocial stressors. Although depressed, seems less hopeless and better able to be future oriented. He is tolerating medications well. Reports some degree of congestion secondary to " having a cold "- of note, no fever, no systemic symptoms.  Treatment Plan Summary: Daily contact with patient to assess and evaluate symptoms and progress in treatment and Medication management Encourage group participation, milieu participation to work on coping skills and symptom reduction.  Continue PROZAC 20 mgrs QDAY for depression Continue  REMERON  15  mgrs QHS for depression and for insomnia  Increased TRAZODONE 100 mgrs QHS for insomnia as needed . Continue  Vistaril 25 mg every six hours as needed for anxiety Continue  Ativan  0.5 mg every 8 hours  PRN as needed for severe anxiety Continue Cozaar 100 mg daily for Hypertension  Continue NEURONTIN  200 mgrs BID for anxiety/chronic pain  Patient expressing some interest in family meeting, but unsure if family/GF would attend- have reviewed this with CSW, will follow. Continue Mucinex 1200 mg PRN BID for congestion as needed  Continue Tessalon PRN cough Added Doxycycline 100 mg BID x 7 days for resp infection  [place], AGNP-BC  01/04/2016, 2:30PM  Reviewed the information documented and agree with the treatment plan.  Ayinde Swim,JANARDHAHA R. 01/05/2016 11:47 AM

## 2016-01-04 NOTE — Progress Notes (Signed)
Adult Psychoeducational Group Note  Date:  01/04/2016 Time:  10:37 PM  Group Topic/Focus:  Wrap-Up Group:   The focus of this group is to help patients review their daily goal of treatment and discuss progress on daily workbooks.  Participation Level:  Active  Participation Quality:  Appropriate and Attentive  Affect:  Appropriate  Cognitive:  Appropriate  Insight: Appropriate and Good  Engagement in Group:  Engaged  Modes of Intervention:  Education  Additional Comments:  Pt overall had a good day. Pt goal for tomorrow is to attend all groups and prepare for discharge.   Jerline Pain 01/04/2016, 10:37 PM

## 2016-01-04 NOTE — Progress Notes (Signed)
D) Pt has been attending the groups and interacting with his peers. Pt continues to focus on his cold symptoms stating he feels terrible and he fears that the cold is going into his chest. States "it's ironic that I start to feel better emotionally and get this cold that makes me feel terrible. Rates his depression, hopelessness and helplessness all as a 2. Denies SI and HI. A) Pt given support, provided with a 1:1 and given homework to work on his strengths rather than focus on what he doesn't have. Encouragement given. R) Pt denies SI and HI.

## 2016-01-04 NOTE — Progress Notes (Deleted)
Adult Psychoeducational Group Note  Date:  01/04/2016 Time:  10:55 PM  Group Topic/Focus:  Wrap-Up Group:   The focus of this group is to help patients review their daily goal of treatment and discuss progress on daily workbooks.  Participation Level:  Active  Participation Quality:  Appropriate and Attentive  Affect:  Appropriate  Cognitive:  Appropriate  Insight: Appropriate and Good  Engagement in Group:  Engaged  Modes of Intervention:  Education  Additional Comments:  Pt overall had a good day. Pt goal for tomorrow is to attend all groups and prepare to discharge.  Jerline Pain 01/04/2016, 10:55 PM

## 2016-01-05 MED ORDER — DOXYCYCLINE HYCLATE 100 MG PO TABS
100.0000 mg | ORAL_TABLET | Freq: Two times a day (BID) | ORAL | Status: DC
  Administered 2016-01-05 – 2016-01-08 (×7): 100 mg via ORAL
  Filled 2016-01-05 (×4): qty 1
  Filled 2016-01-05: qty 11
  Filled 2016-01-05: qty 1
  Filled 2016-01-05: qty 11
  Filled 2016-01-05: qty 1
  Filled 2016-01-05 (×2): qty 11
  Filled 2016-01-05: qty 1
  Filled 2016-01-05 (×2): qty 11
  Filled 2016-01-05: qty 1

## 2016-01-05 NOTE — Progress Notes (Signed)
Patient has been up and active on the unit, attended group this evening and complained of congestion and cough. Patient currently denies having pain, -si/hi/a/v hall. Support and encouragement offered, safety maintained on unit, will continue to monitor.

## 2016-01-05 NOTE — [facility] Group Notes (Addendum)
[facility] Group Notes:  (Nursing/MHT/Case Management/Adjunct)  Date:  01/05/2016  Time:  1100  Type of Therapy:  Nurse Education  /  Life SKills : the group is focused on teaching patients how to develop healthy support systems.   Participation Level:  Active  Participation Quality:  Appropriate  Affect:  Appropriate  Cognitive:  Alert  Insight:  Appropriate  Engagement in Group:  Engaged  Modes of Intervention:  Education  Summary of Progress/Problems:  Gary Grimes 01/05/2016, 1:41 PM

## 2016-01-05 NOTE — Plan of Care (Signed)
Problem: Alteration in mood Goal: LTG-Patient reports reduction in suicidal thoughts (Patient reports reduction in suicidal thoughts and is able to verbalize a safety plan for whenever patient is feeling suicidal)  Outcome: Progressing Patient currently denies suicidal ideation and will seek a staff member to talk with thoughts come.

## 2016-01-05 NOTE — Progress Notes (Signed)
[facility] MD Progress Note  01/05/2016  Gary Grimes  MRN:  [id number] Subjective:   Patient reports modest improvement but continues to report depression.  He also has a cough that he feels in worsening.  He also continues to ruminate about his significant psychosocial stressors- these revolve around financial difficulties, concerns about bankruptcy, feeling unhappy in his current job, recent break up with GF.Marland Kitchen Denies medication side effects  Objective: Patient seen , case discussed with staff. Patient presents with some improvement- appears better groomed, and although still ruminative, presents somewhat better able to discuss options such as adapting to decreased income, trying to seek other sources for help. He expresses some interest in having a meeting with his GF and /or brother, but is unsure if they would come . Will speak with CSW . No disruptive or agitated behaviors on unit. He has been going to some  Groups per nursing. No medication side effects- currently on Prozac and Remeron. Also, reports sleep partially improved on Trazodone but needed to be increased in dose.  Principal Problem: MDD (major depressive disorder), recurrent severe, without psychosis ([place]) Diagnosis:   Patient Active Problem List   Diagnosis Date Noted  . MDD (major depressive disorder), recurrent severe, without psychosis ([place]) [F33.2] 12/27/2015  . Ventral incisional hernia [K43.2] 01/29/2015  . Perirectal abscess [K61.1] 02/18/2012  . Ventral hernia [K43.9] 07/23/2011  . SPONDYLOSIS [M47.9] 09/19/2008  . CERVICALGIA [M54.2] 09/19/2008  . LOW BACK PAIN [M54.5] 09/19/2008   Total Time spent with patient: 20 minutes   Past Psychiatric History: See H & P  Past Medical History:  Past Medical History  Diagnosis Date  . Hypertension   . Levator syndrome 2001    history   . Perirectal abscess   . High triglycerides   . High cholesterol   . Depression     did get seen in er 4/13 for evaluation-psyc  .  Arthritis   . Anginal pain ([place])     ER visit 01/14/2014 visit on chart   . Sleep apnea     pt has CPAP machine but does not use due to mask   . Anxiety     pt denies  . GERD (gastroesophageal reflux disease)   . Panic attacks     Past Surgical History  Procedure Laterality Date  . Appendectomy  1984  . Anal fissure repair  08/05/2000    proctoscopy  . Irrigation and debridement abscess  02/18/2012    peri-rectal  . Umbilical hernia repair  10/27/2010  . Shoulder arthroscopy w/ labral repair  08/08/2007    left  . Colonscopy     . Ventral hernia repair N/A 01/29/2015    Procedure: LAPAROSCOPIC VENTRAL HERNIA;  Surgeon: Excell Seltzer, MD;  Location: [facility];  Service: General;  Laterality: N/A;  . Insertion of mesh N/A 01/29/2015    Procedure: INSERTION OF MESH;  Surgeon: Excell Seltzer, MD;  Location: [facility];  Service: General;  Laterality: N/A;   Family History:  Family History  Problem Relation Age of Onset  . 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 Marital Status: Widowed    Spouse Name: N/A  . Number of Children: N/A  .  Years of Education: N/A   Social History Main Topics  . Smoking status: Former Smoker -- 0.50 packs/day for 1 years    Types: Cigarettes    Quit date: 07/06/1977  . Smokeless tobacco: Never Used  . 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 Drug Use: No  . Sexual Activity: Not Asked   Other Topics Concern  . None   Social History Narrative   Additional Social History:    Pain Medications: none Prescriptions:  (N/A) History of alcohol / drug use?: No history of alcohol / drug abuse Longest period of sobriety (when/how long): N/A Negative Consequences of Use: Personal  relationships Withdrawal Symptoms:  (denies previous withdrawal)  Sleep: Fair  Appetite:   improved  Current Medications: Current Facility-Administered Medications  Medication Dose Route Frequency Provider Last Rate Last Dose  . acetaminophen (TYLENOL) tablet 650 mg  650 mg Oral Q6H PRN Benjamine Mola, FNP   650 mg at 01/01/16 1314  . albuterol (PROVENTIL) (2.5 MG/3ML) 0.083% nebulizer solution 2.5 mg  2.5 mg Nebulization Q6H PRN Kerrie Buffalo, NP   2.5 mg at 01/04/16 2032  . alum & mag hydroxide-simeth (MAALOX/MYLANTA) 200-200-20 MG/5ML suspension 30 mL  30 mL Oral Q4H PRN Benjamine Mola, FNP      . benzonatate (TESSALON) capsule 100 mg  100 mg Oral TID PRN Kerrie Buffalo, NP   100 mg at 01/05/16 [id number]  . doxycycline (VIBRA-TABS) tablet 100 mg  100 mg Oral Q12H Kerrie Buffalo, NP   100 mg at 01/05/16 0819  . FLUoxetine (PROZAC) capsule 20 mg  20 mg Oral Daily Jenne Campus, MD   20 mg at 01/05/16 0819  . gabapentin (NEURONTIN) capsule 200 mg  200 mg Oral BID Jenne Campus, MD   200 mg at 01/05/16 0819  . guaiFENesin (MUCINEX) 12 hr tablet 1,200 mg  1,200 mg Oral BID PRN Kerrie Buffalo, NP   1,200 mg at 01/05/16 [id number]  . hydrOXYzine (ATARAX/VISTARIL) tablet 25 mg  25 mg Oral Q6H PRN Benjamine Mola, FNP   25 mg at 01/02/16 1437  . ibuprofen (ADVIL,MOTRIN) tablet 400 mg  400 mg Oral Q6H PRN Encarnacion Slates, NP   400 mg at 01/03/16 1303  . LORazepam (ATIVAN) tablet 0.5 mg  0.5 mg Oral Q8H PRN Jenne Campus, MD   0.5 mg at 01/03/16 1303  . losartan (COZAAR) tablet 100 mg  100 mg Oral QPM Benjamine Mola, FNP   100 mg at 01/04/16 1711  . magnesium hydroxide (MILK OF MAGNESIA) suspension 30 mL  30 mL Oral Daily PRN Benjamine Mola, FNP      . mirtazapine (REMERON) tablet 15 mg  15 mg Oral QHS Jenne Campus, MD   15 mg at 01/04/16 2116  . traZODone (DESYREL) tablet 100 mg  100 mg Oral QHS,MR X 1 Kerrie Buffalo, NP   100 mg at 01/04/16 2116    Lab Results: No results found for this or any  previous visit (from the past 48 hour(s)).  Physical Findings: AIMS: Facial and Oral Movements Muscles of Facial Expression: None, normal Lips and Perioral Area: None, normal Jaw: None, normal Tongue: None, normal,Extremity Movements Upper (arms, wrists, hands, fingers): None, normal Lower (legs, knees, ankles, toes): None, normal, Trunk Movements Neck, shoulders, hips: None, normal, Overall Severity Severity of abnormal movements (highest score from questions above): None, normal Incapacitation due to abnormal movements: None, normal Patient's  awareness of abnormal movements (rate only patient's report): No Awareness, Dental Status Current problems with teeth and/or dentures?: No Does patient usually wear dentures?: No  CIWA:  CIWA-Ar Total: 1 COWS:  COWS Total Score: 1  Musculoskeletal: Strength & Muscle Tone: within normal limits Gait & Station: normal Patient leans: N/A  Psychiatric Specialty Exam: Review of Systems  Constitutional: Positive for malaise/fatigue.  HENT: Negative.   Eyes: Negative.   Respiratory: Negative.   Cardiovascular: Negative.   Gastrointestinal: Negative.   Genitourinary: Negative.   Musculoskeletal: Positive for back pain.  Skin: Negative.   Neurological: Negative.   Endo/Heme/Allergies: Negative.   Psychiatric/Behavioral: Positive for depression and suicidal ideas. The patient is nervous/anxious and has insomnia.   Reports feeling " congested". No coughing, no SOB, no fever   Blood pressure 130/71, pulse 80, temperature 97.8 F (36.6 C), temperature source Oral, resp. rate 14, height 6' (1.829 m), weight 122.471 kg (270 lb), SpO2 97 %.Body mass index is 36.61 kg/(m^2).  General Appearance:  Grooming is improved   Eye Contact::  Good  Speech:  Clear and Coherent  Volume:  Decreased  Mood:  Still depressed, but improved   Affect:  Constricted, but  more reactive   Thought Process:  Coherent and Intact  Orientation:  Full (Time, Place, and  Person)  Thought Content:   Ruminations about stressors, no psychotic symptoms  Suicidal Thoughts:   Denies  SI, denies current plans or intentions of  hurting himself and contracts for safety on the unit at this time  Homicidal Thoughts:  No  Memory:  Immediate;   Good Recent;   Good Remote;   Good  Judgement:  Improving   Insight:  Fair  Psychomotor Activity:  Normal  Concentration:  Good  Recall:  Good  Fund of Knowledge:Good  Language: Good  Akathisia:  No  Handed:  Right  AIMS (if indicated):     Assets:  Communication Skills Desire for Improvement Leisure Time Physical Health Resilience Vocational/Educational  ADL's:  Intact  Cognition: WNL  Sleep:  Number of Hours: 6.5  Assessment -  Patient is partially improved compared to admission , but continues to present with ruminations about his severe psychosocial stressors. Although depressed, seems less hopeless and better able to be future oriented. He is tolerating medications well. Reports some degree of congestion secondary to " having a cold "- of note, no fever, no systemic symptoms. Treatment Plan Summary: Daily contact with patient to assess and evaluate symptoms and progress in treatment and Medication management Encourage group participation, milieu participation to work on coping skills and symptom reduction. Continue PROZAC 20 mgrs QDAY for depression Continue  REMERON   15  mgrs QHS for depression and for insomnia  Increase TRAZODONE to 100 mgrs QHS for insomnia as needed . Continue  Vistaril 25 mg every six hours as needed for anxiety Continue  Ativan  0.5 mg every 8 hours  PRN as needed for severe anxiety Continue Cozaar 100 mg daily for Hypertension  Continue NEURONTIN  200 mgrs BID for anxiety/chronic pain  Patient expressing some interest in family meeting, but unsure if family/GF would attend- have reviewed this with CSW, will follow. Mucinex increase dose to 1200 PRN BID for congestion as needed    [place], AGNP-BC 01/05/2016, 2:30PM

## 2016-01-05 NOTE — Progress Notes (Addendum)
D) Pt's mood and affect are much improved. States "I had a conversation with my girlfriend last night. She was telling me I was worthless and good for nothing. That I couldn't keep a job. I know that I lost my job, but I have been depressed and it has been difficult for me. Right now I am feeling much better. Just wish I could get rid of this cold". Pt rates his depression at a 1, hopelessness at a 2 and anxiety at a 1. Denies SI and HI. A) Given support, reassurance and praise along with encouragement. Provided with a 1:1.  R) Denies SI and HI.

## 2016-01-05 NOTE — Progress Notes (Signed)
[facility] Group Notes:  (Nursing/MHT/Case Management/Adjunct)  Date:  01/05/2016  Time:  11:22 PM  Type of Therapy:  Psychoeducational Skills  Participation Level:  Active  Participation Quality:  Appropriate  Affect:  Appropriate  Cognitive:  Appropriate  Insight:  Appropriate  Engagement in Group:  Engaged  Modes of Intervention:  Education  Summary of Progress/Problems: Patient had little to offer in group this evening except to say that he had a good day and that he was taking medication for his cold/cough. The patient did not state who is support system is going to be (support system).   Shaleka Brines S 01/05/2016, 11:22 PM

## 2016-01-05 NOTE — [facility] Group Notes (Signed)
[place] Group Notes:  (Clinical Social Work)   01/05/2016 1:15-2:15PM  Summary of Progress/Problems:   The main focus of today's process group was to   1)  discuss the importance of adding supports  2)  define health supports versus unhealthy supports  3)  identify the patient's current unhealthy supports and plan how to handle them  4)  Identify the patient's current healthy supports and plan what to add.  An emphasis was placed on using counselor, doctor, therapy groups, 12-step groups, and problem-specific support groups to expand supports.    The patient expressed full comprehension of the concepts presented, and agreed that there is a need to add more supports.  The patient stated his mother, brother, and middle/oldest sons are healthy supports while his youngest son and ex-girlfriend are unhealthy ones.  He talked about how difficult it is to accept this.    Type of Therapy:  Process Group with Motivational Interviewing  Participation Level:  Active  Participation Quality:  Attentive and Sharing  Affect:  Blunted and Depressed  Cognitive:  Appropriate and Oriented  Insight:  Engaged  Engagement in Therapy:  Engaged  Modes of Intervention:   Education, Support and Processing, Activity  Selmer Dominion, LCSW 01/05/2016   4:32 PM

## 2016-01-05 NOTE — [facility] Group Notes (Deleted)
[place] Group Notes:  (Nursing/MHT/Case Management/Adjunct)  Date:  01/05/2016  Time:  1100  Type of Therapy:  Nurse Education  Life Skills  : The group is focused on teaching patients how to identify healthy support systems.   Participation Level:  Active  Participation Quality:  Appropriate  Affect:  Appropriate  Cognitive:  Alert  Insight:  Improving  Engagement in Group:  Engaged  Modes of Intervention:  Education  Summary of Progress/Problems:  [name] 01/05/2016, 2:08 PM

## 2016-01-06 MED ORDER — GABAPENTIN 300 MG PO CAPS
300.0000 mg | ORAL_CAPSULE | Freq: Two times a day (BID) | ORAL | Status: DC
  Administered 2016-01-06 – 2016-01-08 (×4): 300 mg via ORAL
  Filled 2016-01-06: qty 14
  Filled 2016-01-06 (×4): qty 1
  Filled 2016-01-06 (×3): qty 14

## 2016-01-06 MED ORDER — LORAZEPAM 1 MG PO TABS: 1.0000 mg | ORAL_TABLET | Freq: Three times a day (TID) | ORAL | Status: DC | PRN

## 2016-01-06 MED ORDER — MIRTAZAPINE 15 MG PO TABS: 15.0000 mg | ORAL_TABLET | Freq: Every day | ORAL | Status: DC

## 2016-01-06 MED ORDER — LOSARTAN POTASSIUM 100 MG PO TABS: 100.0000 mg | ORAL_TABLET | Freq: Every evening | ORAL | Status: DC

## 2016-01-06 MED ORDER — TRAZODONE HCL 100 MG PO TABS: 100.0000 mg | ORAL_TABLET | Freq: Every evening | ORAL | Status: DC | PRN

## 2016-01-06 MED ORDER — TRAZODONE HCL 100 MG PO TABS
100.0000 mg | ORAL_TABLET | Freq: Every evening | ORAL | Status: DC | PRN
  Administered 2016-01-06 – 2016-01-07 (×2): 100 mg via ORAL
  Filled 2016-01-06: qty 7
  Filled 2016-01-06: qty 1

## 2016-01-06 MED ORDER — DOXYCYCLINE HYCLATE 100 MG PO TABS: 100.0000 mg | ORAL_TABLET | Freq: Two times a day (BID) | ORAL | Status: DC

## 2016-01-06 MED ORDER — FLUOXETINE HCL 20 MG PO CAPS
40.0000 mg | ORAL_CAPSULE | Freq: Every day | ORAL | Status: DC
  Administered 2016-01-07 – 2016-01-08 (×2): 40 mg via ORAL
  Filled 2016-01-06 (×2): qty 2
  Filled 2016-01-06 (×2): qty 14

## 2016-01-06 MED ORDER — GABAPENTIN 100 MG PO CAPS: 200.0000 mg | ORAL_CAPSULE | Freq: Two times a day (BID) | ORAL | Status: DC

## 2016-01-06 MED ORDER — FLUOXETINE HCL 20 MG PO CAPS: 20.0000 mg | ORAL_CAPSULE | Freq: Every day | ORAL | Status: DC

## 2016-01-06 NOTE — Progress Notes (Signed)
Patient ID: Gary Grimes, male   DOB: 1961-08-05, 55 y.o.   MRN: [id number] [facility] MD Progress Note  01/06/2016  KASHIUS CALVEY  MRN:  [id number] Subjective:   Patient reports he is feeling overwhelmed by stressors, very anxious, not feeling able to " face my problems right now". Denies medication side effects.  Objective: Patient seen , case discussed with staff. Chart notes indicate patient had been feeling better and had reported improving mood.  Discharge was considered for today. Patient presented calm initially, but developed quickly  worsening symptoms as he focused on discharge planning and recommendations. He developed symptoms of an acute panic attack, with severe anxiety,  uncontrollable sobbing/  crying , and tremors. He reported he felt he could not face his stressors, mainly financial concerns and worries he might eventually lose car and home . Patient gradually calmed down  with support, empathy,  deep breathing , but reported ongoing anxiety, depression. Although he denied any suicidal ideations at this time, he stated  he did not feel safe for discharge , and felt overwhelmed with the prospect of leaving unit at this time.  Denies medication side effects and does think these are helping .   Principal Problem: MDD (major depressive disorder), recurrent severe, without psychosis ([place]) Diagnosis:   Patient Active Problem List   Diagnosis Date Noted  . MDD (major depressive disorder), recurrent severe, without psychosis ([place]) [F33.2] 12/27/2015  . Ventral incisional hernia [K43.2] 01/29/2015  . Perirectal abscess [K61.1] 02/18/2012  . Ventral hernia [K43.9] 07/23/2011  . SPONDYLOSIS [M47.9] 09/19/2008  . CERVICALGIA [M54.2] 09/19/2008  . LOW BACK PAIN [M54.5] 09/19/2008   Total Time spent with patient: 30 minutes   Past Psychiatric History: See H & P  Past Medical History:  Past Medical History  Diagnosis Date  . Hypertension   . Levator syndrome 2001    history   .  Perirectal abscess   . High triglycerides   . High cholesterol   . Depression     did get seen in er 4/13 for evaluation-psyc  . Arthritis   . Anginal pain ([place])     ER visit 01/14/2014 visit on chart   . Sleep apnea     pt has CPAP machine but does not use due to mask   . Anxiety     pt denies  . GERD (gastroesophageal reflux disease)   . Panic attacks     Past Surgical History  Procedure Laterality Date  . Appendectomy  1984  . Anal fissure repair  08/05/2000    proctoscopy  . Irrigation and debridement abscess  02/18/2012    peri-rectal  . Umbilical hernia repair  10/27/2010  . Shoulder arthroscopy w/ labral repair  08/08/2007    left  . Colonscopy     . Ventral hernia repair N/A 01/29/2015    Procedure: LAPAROSCOPIC VENTRAL HERNIA;  Surgeon: Excell Seltzer, MD;  Location: [facility];  Service: General;  Laterality: N/A;  . Insertion of mesh N/A 01/29/2015    Procedure: INSERTION OF MESH;  Surgeon: Excell Seltzer, MD;  Location: [facility];  Service: General;  Laterality: N/A;   Family History:  Family History  Problem Relation Age of Onset  . 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 Marital Status: Widowed    Spouse Name: N/A  . Number of Children: N/A  . Years of Education: N/A   Social History Main Topics  . Smoking status: Former Smoker -- 0.50 packs/day for 1 years    Types: Cigarettes    Quit date: 07/06/1977  . Smokeless tobacco: Never Used  . 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 Drug Use: No  . Sexual Activity: Not Asked   Other Topics Concern  . None   Social History Narrative   Additional Social History:    Pain Medications: none Prescriptions:  (N/A) History of alcohol / drug  use?: No history of alcohol / drug abuse Longest period of sobriety (when/how long): N/A Negative Consequences of Use: Personal relationships Withdrawal Symptoms:  (denies previous withdrawal)  Sleep: good   Appetite:   improved  Current Medications: Current Facility-Administered Medications  Medication Dose Route Frequency Provider Last Rate Last Dose  . acetaminophen (TYLENOL) tablet 650 mg  650 mg Oral Q6H PRN Benjamine Mola, FNP   650 mg at 01/05/16 2300  . albuterol (PROVENTIL) (2.5 MG/3ML) 0.083% nebulizer solution 2.5 mg  2.5 mg Nebulization Q6H PRN Kerrie [place], NP   2.5 mg at 01/06/16 1559  . alum & mag hydroxide-simeth (MAALOX/MYLANTA) 200-200-20 MG/5ML suspension 30 mL  30 mL Oral Q4H PRN Benjamine Mola, FNP      . benzonatate (TESSALON) capsule 100 mg  100 mg Oral TID PRN Kerrie [place], NP   100 mg at 01/05/16 1716  . doxycycline (VIBRA-TABS) tablet 100 mg  100 mg Oral Q12H Kerrie [place], NP   100 mg at 01/06/16 0823  . FLUoxetine (PROZAC) capsule 20 mg  20 mg Oral Daily Jenne Campus, MD   20 mg at 01/06/16 [id number]  . gabapentin (NEURONTIN) capsule 200 mg  200 mg Oral BID Jenne Campus, MD   200 mg at 01/06/16 [id number]  . guaiFENesin (MUCINEX) 12 hr tablet 1,200 mg  1,200 mg Oral BID PRN Kerrie [place], NP   1,200 mg at 01/06/16 0824  . hydrOXYzine (ATARAX/VISTARIL) tablet 25 mg  25 mg Oral Q6H PRN Benjamine Mola, FNP   25 mg at 01/02/16 1437  . ibuprofen (ADVIL,MOTRIN) tablet 400 mg  400 mg Oral Q6H PRN Encarnacion Slates, NP   400 mg at 01/03/16 1303  . LORazepam (ATIVAN) tablet 0.5 mg  0.5 mg Oral Q8H PRN Jenne Campus, MD   0.5 mg at 01/06/16 1501  . losartan (COZAAR) tablet 100 mg  100 mg Oral QPM Benjamine Mola, FNP   100 mg at 01/05/16 1716  . magnesium hydroxide (MILK OF MAGNESIA) suspension 30 mL  30 mL Oral Daily PRN Benjamine Mola, FNP      . mirtazapine (REMERON) tablet 15 mg  15 mg Oral QHS Jenne Campus, MD   15 mg at 01/05/16 2131  . traZODone (DESYREL)  tablet 100 mg  100 mg Oral QHS,MR X 1 Kerrie [place], NP   100 mg at 01/05/16 2301    Lab Results: No results found for this or any previous visit (from the past 48 hour(s)).  Physical Findings: AIMS: Facial and Oral Movements Muscles of Facial Expression: None, normal Lips and Perioral Area: None, normal Jaw: None, normal Tongue: None, normal,Extremity Movements Upper (arms, wrists, hands, fingers): None, normal Lower (legs, knees, ankles, toes): None, normal, Trunk Movements  Neck, shoulders, hips: None, normal, Overall Severity Severity of abnormal movements (highest score from questions above): None, normal Incapacitation due to abnormal movements: None, normal Patient's awareness of abnormal movements (rate only patient's report): No Awareness, Dental Status Current problems with teeth and/or dentures?: No Does patient usually wear dentures?: No  CIWA:  CIWA-Ar Total: 1 COWS:  COWS Total Score: 1  Musculoskeletal: Strength & Muscle Tone: within normal limits Gait & Station: normal Patient leans: N/A  Psychiatric Specialty Exam: Review of Systems  Constitutional: Positive for malaise/fatigue.  Eyes: Negative.   Respiratory: Negative.   Cardiovascular: Negative.   Gastrointestinal: Negative.   Genitourinary: Negative.   Musculoskeletal: Positive for back pain.  Skin: Negative.   Neurological: Positive for headaches.  Endo/Heme/Allergies: Negative.   Psychiatric/Behavioral: Positive for depression. The patient is nervous/anxious.   Reports feeling " congested". No coughing, no SOB, no fever   Blood pressure 99/64, pulse 84, temperature 98.1 F (36.7 C), temperature source Oral, resp. rate 16, height 6' (1.829 m), weight 270 lb (122.471 kg), SpO2 97 %.Body mass index is 36.61 kg/(m^2).  General Appearance:  Neat  Eye Contact::  Good  Speech:  Clear and Coherent  Volume:  Decreased  Mood:  Has improved but today  Anxious, tearful  Affect:  Severe anxiety  Thought  Process:  Coherent and Intact  Orientation:  Full (Time, Place, and Person)  Thought Content:   Ruminations about stressors, no psychotic symptoms  Suicidal Thoughts:  Denies SI on unit, but stated did not feel safe for discharge from unit at this time  Homicidal Thoughts:  No  Memory:  Immediate;   Good Recent;   Good Remote;   Good  Judgement:  Improving   Insight:  Fair  Psychomotor Activity:  Normal  Concentration:  Good  Recall:  Good  Fund of Knowledge:Good  Language: Good  Akathisia:  No  Handed:  Right  AIMS (if indicated):     Assets:  Communication Skills Desire for Improvement Leisure Time Physical Health Resilience Vocational/Educational  ADL's:  Intact  Cognition: WNL  Sleep:  Number of Hours: 6.25  Assessment -  Patient's mood has improved over recent days, as per notes. Today, however, became severely anxious and developed a panic attack when discussing discharge planning . For a period of time was sobbing uncontrollably and reporting feeling overwhelmed by his stressors. Gradually calmed down with support and deep breathing. Although denied any SI , stated he did not feel safe for discharge at this time. As discussed with staff, D/C cancelled due to above. Continue inpatient treatment . Patient feels medications are helping and are well tolerated, will titrate Prozac and also titrate Neurontin for anxiety.   Treatment Plan Summary: Daily contact with patient to assess and evaluate symptoms and progress in treatment and Medication management Encourage group participation, milieu participation to work on coping skills and symptom reduction. Increase  PROZAC to 40  mgrs QDAY for depression Continue  REMERON  15  mgrs QHS for depression and for insomnia  Continue  TRAZODONE 100 mgrs QHS  PRN  for insomnia . Continue  Vistaril 25 mg every six hours as needed for anxiety Increase   Ativan  To 1 mg every 8 hours  PRN as needed for severe anxiety Continue Cozaar 100 mg  daily for Hypertension  Increase NEURONTIN  To 300  mgrs BID for anxiety/chronic pain  Continue Mucinex 1200 mg PRN BID for congestion as needed  Continue Tessalon PRN cough Added Doxycycline 100 mg  BID x 7 days for resp infection   Branton Einstein 01/06/2016 4:37 PM

## 2016-01-06 NOTE — Tx Team (Signed)
Interdisciplinary Treatment Plan Update (Adult) Date: 01/06/2016   Date: 01/06/2016 10:01 AM  Progress in Treatment:  Attending groups: Yes  Participating in groups: Yes  Taking medication as prescribed: Yes  Tolerating medication: Yes  Family/Significant othe contact made: Yes with son Patient understands diagnosis: Yes AEB seeking help with depression Discussing patient identified problems/goals with staff: Yes  Medical problems stabilized or resolved: Yes  Denies suicidal/homicidal ideation: Yes Patient has not harmed self or Others: Yes   New problem(s) identified: None identified at this time.   Discharge Plan or Barriers: Pt will discharge home and follow-up with [facility].  Additional comments:  Patient and CSW reviewed pt's identified goals and treatment plan. Patient verbalized understanding and agreed to treatment plan. CSW reviewed [facility] "Discharge Process and Patient Involvement" Form. Pt verbalized understanding of information provided and signed form.   Reason for Continuation of Hospitalization:  Anxiety Depression Medication stabilization Suicidal ideation  Estimated length of stay: 0 days; Pt stable for DC  Review of initial/current patient goals per problem list:   1.  Goal(s): Patient will participate in aftercare plan  Met:  Yes  Target date: 3-5 days from date of admission   As evidenced by: Patient will participate within aftercare plan AEB aftercare provider and housing plan at discharge being identified.   12/30/15: Pt will DC home and follow-up with [facility]  2.  Goal (s): Patient will exhibit decreased depressive symptoms and suicidal ideations.  Met:  Yes  Target date: 3-5 days from date of admission   As evidenced by: Patient will utilize self rating of depression at 3 or below and demonstrate decreased signs of depression or be deemed stable for discharge by MD.  12/30/15: Pt rates depression at 9/10; endorses passive SI  01/03/16: Pt continues to rate  depression at high rate; endorses passive SI  01/06/16: Pt denies depression and depression  3.  Goal(s): Patient will demonstrate decreased signs and symptoms of anxiety.  Met:  Yes  Target date: 3-5 days from date of admission   As evidenced by: Patient will utilize self rating of anxiety at 3 or below and demonstrated decreased signs of anxiety, or be deemed stable for discharge by MD  12/30/15: Pt rates anxiety at 9/10; appears withdrawn in the milieu.  01/03/16: Pt continues to endorse anxiety at high rates.  01/06/16: Pt denies anxiety; affect improved  Attendees:  Patient:    Family:    Physician: Dr. Parke Poisson, MD  01/06/2016 10:01 AM  Nursing: Lars Pinks, RN Case manager  01/06/2016 10:01 AM  Clinical Social Worker Peri Maris, [place] 01/06/2016 10:01 AM  Other: ,  01/06/2016 10:01 AM  Clinical: Darrol Angel, RN 01/06/2016 10:01 AM  Other: , RN Charge Nurse 01/06/2016 10:01 AM  Other:     Peri Maris, [place] Social Work [phone]

## 2016-01-06 NOTE — Progress Notes (Addendum)
  [facility] Adult Case Management Discharge Plan :  Will you be returning to the same living situation after discharge:  Yes,  Pt returning home At discharge, do you have transportation home?: Yes,  Pt car is at the hospital; will provide his own transportation Do you have the ability to pay for your medications: Yes,  Pt provided with 7-day supply and prescriptions  Release of information consent forms completed and in the chart;  Patient's signature needed at discharge.  Patient to Follow up at: Follow-up Information    Follow up with [facility] On 01/09/2016.   Why:  at 12:30pm for your hospital discharge appointment. Please ask for Dub Mikes when you arrive.   Contact information:   [address] [place], [place] [address] Phone: [phone] Fax: [phone]      Next level of care provider has access to [place] and Suicide Prevention discussed: Yes,  with son; see SPE note for further details  Have you used any form of tobacco in the last 30 days? (Cigarettes, Smokeless Tobacco, Cigars, and/or Pipes): No  Has patient been referred to the Quitline?: N/A patient is not a smoker  Patient has been referred for addiction treatment: N/A  Bo Mcclintock 01/06/2016, 10:03 AM

## 2016-01-06 NOTE — Progress Notes (Signed)
D: Pt is alert and oriented x4. Pt denies any form of depression, anxiety, pain, SI, HI and AVH. He states, "I feel just fine." Pt was in bed ready for sleep at the time of assessment. A: Medications offered as prescribed.  Support, encouragement, and safe environment provided.  15-minute safety checks continue. R: Pt was med compliant.  Pt attended group. Safety checks continue.

## 2016-01-06 NOTE — Progress Notes (Signed)
D: Pt presents flat. Pt rates depression 1/10. Anxiety 3/10. Hopeless 4/10. Pt denies suicidal thoughts. Pt verbally contracts for safety. Pt compliant with taking meds and attending groups. Pt reported feeling anxious and requested prn ativan. Ativan given at pt request. Pt c/o intermittent nonproductive cough. Prn med Mucinex given to pt for cough. A: Medications administered as ordered per MD. Verbal support provided. Pt encouraged to attend groups. 15 minute checks performed for safety. R: Pt receptive to tx.

## 2016-01-06 NOTE — Progress Notes (Signed)
Recreation Therapy Notes  Date: 01.09.2017 Time: 9:30am Location: [address] Dayroom   Group Topic: Stress Management  Goal Area(s) Addresses:  Patient will actively participate in stress management techniques presented during session.   Behavioral Response: Appropriate, Engaged   Intervention: Stress management techniques  Activity :  Deep Breathing and Progressive Muscle Relaxation. LRT provided instruction and demonstration on practice of Progressive Muscle Relaxation. Technique was coupled with deep breathing.   Education:  Stress Management, Discharge Planning.   Education Outcome: Acknowledges education  Clinical Observations/Feedback: Patient actively engaged in technique introduced, expressed no concerns and demonstrated ability to practice independently post d/c.   Laureen Ochs Sevan Mcbroom, LRT/CTRS        Roc Streett L 01/06/2016 2:54 PM

## 2016-01-06 NOTE — [facility] Group Notes (Signed)
[place] LCSW Group Therapy  01/06/2016 1:15pm  Type of Therapy:  Group Therapy vercoming Obstacles  Participation Level:  Active  Participation Quality:  Appropriate   Affect:  Appropriate  Cognitive:  Appropriate and Oriented  Insight:  Developing/Improving and Improving  Engagement in Therapy:  Improving  Modes of Intervention:  Discussion, Exploration, Problem-solving and Support  Description of Group:   In this group patients will be encouraged to explore what they see as obstacles to their own wellness and recovery. They will be guided to discuss their thoughts, feelings, and behaviors related to these obstacles. The group will process together ways to cope with barriers, with attention given to specific choices patients can make. Each patient will be challenged to identify changes they are motivated to make in order to overcome their obstacles. This group will be process-oriented, with patients participating in exploration of their own experiences as well as giving and receiving support and challenge from other group members.  Summary of Patient Progress: Pt reports that he is feeling ready for DC today and feels prepared to face his obstacles. Pt reports a desire to "stay busy and find a job" at discharge in order to be able to cope with his stress and isolation.   Therapeutic Modalities:   Cognitive Behavioral Therapy Solution Focused Therapy Motivational Interviewing Relapse Prevention Therapy   Peri Maris, LCSWA 01/06/2016 2:52 PM

## 2016-01-07 NOTE — Progress Notes (Signed)
Writer spoke with pt in regards to MD recommendation for pt to continue taking antibiotics along with Mucinex. Writer have not observed pt coughing up mucus.  Pt have a nonproductive cough (subjective). Afebrile. Pt verbalized understanding of MD recommendation and verbalized that he would like to be sent home with the rest of his antibiotic meds and Mucinex.

## 2016-01-07 NOTE — Progress Notes (Addendum)
Patient ID: Gary Grimes, male   DOB: [birth year], 55 y.o.   MRN: [id number] [facility] MD Progress Note  01/07/2016  Gary Grimes  MRN:  [id number] Subjective:   Patient continues to feel worried and apprehensive , but states today feeling " better". Denies medication side effects. Denies medication side effects.  Objective: Patient seen , case discussed with staff. Patient had severe anxiety, panic attack yesterday when discussing discharge, and reported feeling overwhelmed by stressors. Today, although still anxious, presents significantly improved compared to yesterday. He states he feels a little more hopeful and a little more sure of himself. 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  Overall , presents with increased self confidence and less severe anxiety today. Denies medication side effects.  Reports medications are helping. No disruptive or agitated behaviors on unit. Patient has had symptoms of upper respiratory infection-  ( " cold " symptoms ) - he states these are now improved, better . He is not presenting with fever, chills, or systemic symptoms.   Principal Problem: MDD (major depressive disorder), recurrent severe, without psychosis ([place]) Diagnosis:   Patient Active Problem List   Diagnosis Date Noted  . MDD (major depressive disorder), recurrent severe, without psychosis ([place]) [F33.2] 12/27/2015  . Ventral incisional hernia [K43.2] 01/29/2015  . Perirectal abscess [K61.1] 02/18/2012  . Ventral hernia [K43.9] 07/23/2011  . SPONDYLOSIS [M47.9] 09/19/2008  . CERVICALGIA [M54.2] 09/19/2008  . LOW BACK PAIN [M54.5] 09/19/2008   Total Time spent with patient: 20 minutes   Past Psychiatric History: See H & P  Past Medical History:  Past Medical History  Diagnosis Date  . Hypertension   . Levator syndrome 2001    history   . Perirectal  abscess   . High triglycerides   . High cholesterol   . Depression     did get seen in er 4/13 for evaluation-psyc  . Arthritis   . Anginal pain ([place])     ER visit 01/14/2014 visit on chart   . Sleep apnea     pt has CPAP machine but does not use due to mask   . Anxiety     pt denies  . GERD (gastroesophageal reflux disease)   . Panic attacks     Past Surgical History  Procedure Laterality Date  . Appendectomy  1984  . Anal fissure repair  08/05/2000    proctoscopy  . Irrigation and debridement abscess  02/18/2012    peri-rectal  . Umbilical hernia repair  10/27/2010  . Shoulder arthroscopy w/ labral repair  08/08/2007    left  . Colonscopy     . Ventral hernia repair N/A 01/29/2015    Procedure: LAPAROSCOPIC VENTRAL HERNIA;  Surgeon: Excell Seltzer, MD;  Location: [facility];  Service: General;  Laterality: N/A;  . Insertion of mesh N/A 01/29/2015    Procedure: INSERTION OF MESH;  Surgeon: Excell Seltzer, MD;  Location: [facility];  Service: General;  Laterality: N/A;   Family History:  Family History  Problem Relation Age of Onset  . 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 Marital Status: Widowed    Spouse Name: N/A  . Number of Children: N/A  . Years of Education: N/A   Social History Main Topics  . Smoking status: Former Smoker -- 0.50 packs/day for 1 years    Types: Cigarettes    Quit date: 07/06/1977  . Smokeless tobacco: Never Used  . 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 Drug Use: No  . Sexual Activity: Not Asked   Other Topics Concern  . None   Social History Narrative   Additional Social History:    Pain Medications: none Prescriptions:  (N/A) History of alcohol / drug use?: No  history of alcohol / drug abuse Longest period of sobriety (when/how long): N/A Negative Consequences of Use: Personal relationships Withdrawal Symptoms:  (denies previous withdrawal)  Sleep: good   Appetite:   improved  Current Medications: Current Facility-Administered Medications  Medication Dose Route Frequency Provider Last Rate Last Dose  . acetaminophen (TYLENOL) tablet 650 mg  650 mg Oral Q6H PRN Benjamine Mola, FNP   650 mg at 01/05/16 2300  . albuterol (PROVENTIL) (2.5 MG/3ML) 0.083% nebulizer solution 2.5 mg  2.5 mg Nebulization Q6H PRN Kerrie Buffalo, NP   2.5 mg at 01/06/16 1559  . alum & mag hydroxide-simeth (MAALOX/MYLANTA) 200-200-20 MG/5ML suspension 30 mL  30 mL Oral Q4H PRN Benjamine Mola, FNP      . benzonatate (TESSALON) capsule 100 mg  100 mg Oral TID PRN Kerrie Buffalo, NP   100 mg at 01/05/16 1716  . doxycycline (VIBRA-TABS) tablet 100 mg  100 mg Oral Q12H Kerrie Buffalo, NP   100 mg at 01/07/16 0804  . FLUoxetine (PROZAC) capsule 40 mg  40 mg Oral Daily Jenne Campus, MD   40 mg at 01/07/16 0804  . gabapentin (NEURONTIN) capsule 300 mg  300 mg Oral BID Jenne Campus, MD   300 mg at 01/07/16 0804  . guaiFENesin (MUCINEX) 12 hr tablet 1,200 mg  1,200 mg Oral BID PRN Kerrie Buffalo, NP   1,200 mg at 01/07/16 0807  . hydrOXYzine (ATARAX/VISTARIL) tablet 25 mg  25 mg Oral Q6H PRN Benjamine Mola, FNP   25 mg at 01/06/16 2145  . ibuprofen (ADVIL,MOTRIN) tablet 400 mg  400 mg Oral Q6H PRN Encarnacion Slates, NP   400 mg at 01/03/16 1303  . LORazepam (ATIVAN) tablet 1 mg  1 mg Oral Q8H PRN Jenne Campus, MD      . losartan (COZAAR) tablet 100 mg  100 mg Oral QPM Benjamine Mola, FNP   100 mg at 01/06/16 1704  . magnesium hydroxide (MILK OF MAGNESIA) suspension 30 mL  30 mL Oral Daily PRN Benjamine Mola, FNP      . mirtazapine (REMERON) tablet 15 mg  15 mg Oral QHS Jenne Campus, MD   15 mg at 01/06/16 2142  . traZODone (DESYREL) tablet 100 mg  100 mg Oral QHS PRN  Jenne Campus, MD   100 mg at 01/06/16 2142    Lab Results: No results found for this or any previous visit (from the past 48 hour(s)).  Physical Findings: AIMS: Facial and Oral Movements Muscles of Facial Expression: None, normal Lips and Perioral Area: None, normal Jaw: None, normal Tongue: None, normal,Extremity Movements Upper (arms, wrists, hands, fingers): None, normal Lower (legs, knees, ankles, toes): None, normal, Trunk Movements Neck, shoulders, hips:  None, normal, Overall Severity Severity of abnormal movements (highest score from questions above): None, normal Incapacitation due to abnormal movements: None, normal Patient's awareness of abnormal movements (rate only patient's report): No Awareness, Dental Status Current problems with teeth and/or dentures?: No Does patient usually wear dentures?: No  CIWA:  CIWA-Ar Total: 1 COWS:  COWS Total Score: 1  Musculoskeletal: Strength & Muscle Tone: within normal limits Gait & Station: normal Patient leans: N/A  Psychiatric Specialty Exam: Review of Systems  Constitutional: Positive for malaise/fatigue.  Eyes: Negative.   Respiratory: Negative.   Cardiovascular: Negative.   Gastrointestinal: Negative.   Genitourinary: Negative.   Musculoskeletal: Positive for back pain.  Skin: Negative.   Neurological: Positive for headaches.  Endo/Heme/Allergies: Negative.   Psychiatric/Behavioral: Positive for depression. The patient is nervous/anxious.   Reports feeling " congested". No coughing, no SOB, no fever   Blood pressure 122/73, pulse 84, temperature 98.7 F (37.1 C), temperature source Oral, resp. rate 16, height 6' (1.829 m), weight 270 lb (122.471 kg), SpO2 97 %.Body mass index is 36.61 kg/(m^2).  General Appearance:  Improved grooming   Eye Contact::  Good  Speech:  Normal Rate  Volume: Normal   Mood:  Improved today compared to yesterday- less severely anxious, less depressed   Affect:  (+)  Anxiety but improved  compared to yesterday  Thought Process:  Coherent and Intact  Orientation:  Full (Time, Place, and Person)  Thought Content:   Ruminations about stressors, no psychotic symptoms  Suicidal Thoughts:  Denies SI on unit, but stated did not feel safe for discharge from unit at this time  Homicidal Thoughts:  No  Memory:  Immediate;   Good Recent;   Good Remote;   Good  Judgement:  Improving   Insight:  Fair  Psychomotor Activity:  Normal  Concentration:  Good  Recall:  Good  Fund of Knowledge:Good  Language: Good  Akathisia:  No  Handed:  Right  AIMS (if indicated):     Assets:  Communication Skills Desire for Improvement Leisure Time Physical Health Resilience Vocational/Educational  ADL's:  Intact  Cognition: WNL  Sleep:  Number of Hours: 6.75  Assessment -  Patient had experienced severe anxiety, panic symptoms yesterday when contemplating discharge and returning to stressors - today presents improved. Still anxious, but to lesser degree,  Less affective lability, and reporting an improved sense of self worth and confidence. Denies medication side effects. Has tolerated Prozac/Neurontin titration well . Behavior on unit in good control.   Treatment Plan Summary: Daily contact with patient to assess and evaluate symptoms and progress in treatment and Medication management Encourage group participation, milieu participation to work on coping skills and symptom reduction. Continue PROZAC  40  mgrs QDAY for depression Continue  REMERON  15  mgrs QHS for depression and for insomnia  Continue  TRAZODONE 100 mgrs QHS  PRN  for insomnia . Continue  Vistaril 25 mg every six hours as needed for anxiety Continue   Ativan   1 mg every 8 hours  PRN as needed for severe anxiety Continue Cozaar 100 mg daily for Hypertension  Continue NEURONTIN  300  mgrs BID for anxiety/chronic pain  Continue Mucinex 1200 mg PRN BID for congestion as needed  Continue Tessalon PRN cough Check TSH to rule  out potential hypothyroidism contributing to depression Treatment team working on disposition planning  On Doxycycline 100 mg BID x 7 days for resp infection   Gary Grimes, Gary Grimes 01/07/2016 12:40 PM

## 2016-01-07 NOTE — Progress Notes (Signed)
D:Pt presents with flat affect and depressed mood. Pt rates depression 6/10. Anxiety  7/10. Hopeless 6/10. Pt denies suicidal thoughts. Pt stated that he's feeling increasingly depressed d/t recent stressors. Pt girlfriend recently broke up with him during his hospitalization.  A: Medications administered as ordered per MD. Verbal support provided. Pt encouraged to attend groups. 15 minute checks performed for safety.  R: Pt receptive to tx.

## 2016-01-07 NOTE — Progress Notes (Signed)
Adult Psychoeducational Group Note  Date:  01/07/2016 Time:  2:58 AM  Group Topic/Focus:  Wrap-Up Group:   The focus of this group is to help patients review their daily goal of treatment and discuss progress on daily workbooks.  Participation Level:  Active  Participation Quality:  Appropriate  Affect:  Appropriate  Cognitive:  Appropriate  Insight: Good  Engagement in Group:  Engaged  Modes of Intervention:  Activity  Additional Comments:  Patient rated his day a 4. He stated that he was supposed to go home but did not because of a panic attack he had. Goal is to get better.  Gary Grimes 01/07/2016, 2:58 AM

## 2016-01-07 NOTE — BHH Group Notes (Signed)

## 2016-01-07 NOTE — BHH Group Notes (Signed)
[facility] LCSW Group Therapy  01/07/2016   1:15 PM   Type of Therapy:  Group Therapy  Participation Level:  Active  Participation Quality:  Attentive  Affect: Appropriate  Cognitive:  Alert and Oriented  Insight:  Developing/Improving and Engaged  Engagement in Therapy:  Developing/Improving and Engaged  Modes of Intervention:  Clarification, Confrontation, Discussion, Education, Exploration, Limit-setting, Orientation, Problem-solving, Rapport Building, Art therapist, Socialization and Support  Summary of Progress/Problems: The topic for group therapy was feelings about diagnosis.  Pt actively participated in group discussion on their past and current diagnosis and how they feel towards this.  Pt also identified how society and family members judge them, based on their diagnosis as well as stereotypes and stigmas.  Patient discussed how 2 people in his life have abandoned him due to not understanding his mental illness which has been discouraging and a reminder of his illness. He shared that his hospitalization has been helpful to him in preparing to handle obstacles in the future and sates "I do matter."  Tilden Fossa, MSW, [place] Worker [facility] [phone]

## 2016-01-07 NOTE — Progress Notes (Signed)
D: Patient alert and oriented x 4. Patient denies pain/SI/HI/AVH. Patient reports he was having an ok day until he was about to discharge and he could not do it. Patient states one stressor is ex-gf that lives next door.  Patient started to cry saying she broke up with him since his admission here at [facility], her reason is him being weak. Patient states he does not wont to be alone and right now here he is not alone but when he gets home he will be. This Probation officer gave patient words of encouragement.  A: Staff to monitor Q 15 mins for safety. Encouragement and support offered. Scheduled medications administered per orders. R: Patient remains safe on the unit. Patient attended group tonight. Patient visible on hte unit and interacting with peers. Patient taking administered medications.

## 2016-01-08 LAB — TSH: TSH: 0.874 u[IU]/mL (ref 0.350–4.500)

## 2016-01-08 MED ORDER — MIRTAZAPINE 15 MG PO TABS: 15.0000 mg | ORAL_TABLET | Freq: Every day | ORAL | Status: DC

## 2016-01-08 MED ORDER — TRAZODONE HCL 100 MG PO TABS: 100.0000 mg | ORAL_TABLET | Freq: Every evening | ORAL | Status: DC | PRN

## 2016-01-08 MED ORDER — GUAIFENESIN ER 600 MG PO TB12: 1200.0000 mg | ORAL_TABLET | Freq: Two times a day (BID) | ORAL | Status: DC | PRN

## 2016-01-08 MED ORDER — DOXYCYCLINE HYCLATE 100 MG PO TABS: 100.0000 mg | ORAL_TABLET | Freq: Two times a day (BID) | ORAL | Status: DC

## 2016-01-08 MED ORDER — GABAPENTIN 300 MG PO CAPS: 300.0000 mg | ORAL_CAPSULE | Freq: Two times a day (BID) | ORAL | Status: DC

## 2016-01-08 MED ORDER — FLUOXETINE HCL 40 MG PO CAPS: 40.0000 mg | ORAL_CAPSULE | Freq: Every day | ORAL | Status: DC

## 2016-01-08 NOTE — Progress Notes (Addendum)
D: Patient is safe on the unit. Patient rates Anxiety and Depression 1/10. He is minimally interactive on the unit and has been resting in his room for most of the evening. He is still endorsing continuous non productive cough. No other concerns or issues at this time. Denies SI/HI/AVH at this time.  A: Q 15 minute checks for patient safety continue. Encouragement and support given. Medications administered as prescribed.  R: Continue to monitor for patient safety and medication effectiveness.

## 2016-01-08 NOTE — Progress Notes (Signed)
Discharge note: Pt received both written and verbal discharge instructions. Pt verbalized understanding of discharged instructions. Pt agreed to f/u appt and med regimen. Pt received sample meds, prescriptions and belongings.

## 2016-01-08 NOTE — BHH Group Notes (Signed)
[facility] LCSW Aftercare Discharge Planning Group Note  01/08/2016 8:45 AM  Participation Quality: Alert, Appropriate and Oriented  Mood/Affect: Appropriate  Depression Rating: 1  Anxiety Rating: 1  Thoughts of Suicide: Pt denies SI/HI  Will you contract for safety? Yes  Current AVH: Pt denies  Plan for Discharge/Comments: Pt attended discharge planning group and actively participated in group. CSW discussed suicide prevention education with the group and encouraged them to discuss discharge planning and any relevant barriers. Pt reports that he is feeling prepared for DC today. No needs expressed at this time.   Transportation Means: Pt reports access to transportation  Supports: No supports mentioned at this time  Peri Maris, Latanya Presser 01/08/2016 9:33 AM

## 2016-01-08 NOTE — Discharge Summary (Signed)
Physician Discharge Summary Note  Patient:  Gary Grimes is an 55 y.o., male MRN:  [id number] DOB:  [birth year] Patient phone:  [phone] (home)  Patient address:   [place] [address],  Total Time spent with patient: 45 minutes  Date of Admission:  12/27/2015 Date of Discharge: 01/07/2015  Reason for Admission:  Depression  Principal Problem: MDD (major depressive disorder), recurrent severe, without psychosis [facility]) Discharge Diagnoses: Patient Active Problem List   Diagnosis Date Noted  . MDD (major depressive disorder), recurrent severe, without psychosis ([place]) [F33.2] 12/27/2015  . Ventral incisional hernia [K43.2] 01/29/2015  . Perirectal abscess [K61.1] 02/18/2012  . Ventral hernia [K43.9] 07/23/2011  . SPONDYLOSIS [M47.9] 09/19/2008  . CERVICALGIA [M54.2] 09/19/2008  . LOW BACK PAIN [M54.5] 09/19/2008    Past Psychiatric History:  MDD  Past Medical History:  Past Medical History  Diagnosis Date  . Hypertension   . Levator syndrome 2001    history   . Perirectal abscess   . High triglycerides   . High cholesterol   . Depression     did get seen in er 4/13 for evaluation-psyc  . Arthritis   . Anginal pain ([place])     ER visit 01/14/2014 visit on chart   . Sleep apnea     pt has CPAP machine but does not use due to mask   . Anxiety     pt denies  . GERD (gastroesophageal reflux disease)   . Panic attacks     Past Surgical History  Procedure Laterality Date  . Appendectomy  1984  . Anal fissure repair  08/05/2000    proctoscopy  . Irrigation and debridement abscess  02/18/2012    peri-rectal  . Umbilical hernia repair  10/27/2010  . Shoulder arthroscopy w/ labral repair  08/08/2007    left  . Colonscopy     . Ventral hernia repair N/A 01/29/2015    Procedure: LAPAROSCOPIC VENTRAL HERNIA;  Surgeon: Excell Seltzer, MD;  Location: [facility];  Service: General;  Laterality: N/A;  . Insertion of mesh N/A 01/29/2015    Procedure: INSERTION  OF MESH;  Surgeon: Excell Seltzer, MD;  Location: [facility];  Service: General;  Laterality: N/A;   Family History:  Family History  Problem Relation Age of Onset  . 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 Marital Status: Widowed    Spouse Name: N/A  . Number of Children: N/A  . Years of Education: N/A   Social History Main Topics  . Smoking status: Former Smoker -- 0.50 packs/day for 1 years    Types: Cigarettes    Quit date: 07/06/1977  . Smokeless tobacco: Never Used  . 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 Drug Use: No  . Sexual Activity: Not Asked   Other Topics Concern  . None   Social History Narrative    Hospital Course:  Gary Grimes was admitted for MDD (major depressive disorder), recurrent severe, without psychosis ([place]) and crisis management.  He was treated with the following medications as listed below.  Gary Grimes was discharged with current medication and was instructed on how to  take medications as prescribed; (details listed below under Medication List).  Medical problems were identified and treated as needed.  Home medications were restarted as appropriate.  Improvement was monitored by observation and Gary Grimes.  Emotional and mental status was monitored by daily self-inventory reports completed by Gary Grimes and clinical staff.         Gary Grimes was evaluated by the treatment team for stability and plans for continued recovery upon discharge.  Gary Grimes motivation was an integral factor for scheduling further treatment.  Employment, transportation, bed availability, health status, family support, and any pending legal  issues were also considered during his hospital stay.  He was offered further treatment options upon discharge including but not limited to Residential, Intensive Outpatient, and Outpatient treatment.  Gary Grimes will follow up with the services as listed below under Follow Up Information.     Upon completion of this admission the Gary Grimes was both mentally and medically stable for discharge denying suicidal/homicidal ideation, auditory/visual/tactile hallucinations, delusional thoughts and paranoia.     Physical Findings: AIMS: Facial and Oral Movements Muscles of Facial Expression: None, normal Lips and Perioral Area: None, normal Jaw: None, normal Tongue: None, normal,Extremity Movements Upper (arms, wrists, hands, fingers): None, normal Lower (legs, knees, ankles, toes): None, normal, Trunk Movements Neck, shoulders, hips: None, normal, Overall Severity Severity of abnormal movements (highest score from questions above): None, normal Incapacitation due to abnormal movements: None, normal Patient's awareness of abnormal movements (rate only patient's report): No Awareness, Dental Status Current problems with teeth and/or dentures?: No Does patient usually wear dentures?: No  CIWA:  CIWA-Ar Total: 1 COWS:  COWS Total Score: 1  Musculoskeletal: Strength & Muscle Tone: within normal limits Gait & Station: normal Patient leans: N/A  Psychiatric Specialty Exam:  SEE MD SRA Review of Systems  All other systems reviewed and are negative.   Blood pressure 109/55, pulse 80, temperature 98.5 F (36.9 C), temperature source Oral, resp. rate 16, height 6' (1.829 m), weight 122.471 kg (270 lb), SpO2 97 %.Body mass index is 36.61 kg/(m^2).  Have you used any form of tobacco in the last 30 days? (Cigarettes, Smokeless Tobacco, Cigars, and/or Pipes): No  Has this patient used any form of tobacco in the last 30 days? (Cigarettes, Smokeless Tobacco, Cigars, and/or Pipes) Yes,  N/A  Metabolic Disorder Labs:  No results found for: HGBA1C, MPG No results found for: PROLACTIN No results found for: CHOL, TRIG, HDL, CHOLHDL, VLDL, LDLCALC  See Psychiatric Specialty Exam and Suicide Risk Assessment completed by Attending Physician prior to discharge.  Discharge destination:  Home  Is patient on multiple antipsychotic therapies at discharge:  No   Has Patient had three or more failed trials of antipsychotic monotherapy by history:  No  Recommended Plan for Multiple Antipsychotic Therapies: NA  Discharge Instructions    Discharge instructions    Complete by:  As directed   Please follow up with your Primary Care Provider for further management of Hypertension and if symptoms of respiratory infection persist after course of antibiotic.            Medication List    STOP taking these medications        citalopram 20 MG tablet  Commonly known as:  CELEXA     oxyCODONE-acetaminophen 5-325 MG tablet  Commonly known as:  PERCOCET/ROXICET     QUEtiapine 300 MG tablet  Commonly known as:  SEROQUEL  TAKE these medications      Indication   doxycycline 100 MG tablet  Commonly known as:  VIBRA-TABS  Take 1 tablet (100 mg total) by mouth every 12 (twelve) hours.   Indication:  Infection of the Respiratory Tract, Samples to be provided by pharmacy.  Resp infection     FLUoxetine 40 MG capsule  Commonly known as:  PROZAC  Take 1 capsule (40 mg total) by mouth daily.   Indication:  Major Depressive Disorder     gabapentin 300 MG capsule  Commonly known as:  NEURONTIN  Take 1 capsule (300 mg total) by mouth 2 (two) times daily.   Indication:  Agitation     guaiFENesin 600 MG 12 hr tablet  Commonly known as:  MUCINEX  Take 2 tablets (1,200 mg total) by mouth 2 (two) times daily as needed for cough or to loosen phlegm.   Indication:  Cough     losartan 100 MG tablet  Commonly known as:  COZAAR  Take 1 tablet (100 mg total) by mouth every evening.    Indication:  High Blood Pressure     mirtazapine 15 MG tablet  Commonly known as:  REMERON  Take 1 tablet (15 mg total) by mouth at bedtime.   Indication:  Trouble Sleeping, Major Depressive Disorder     traZODone 100 MG tablet  Commonly known as:  DESYREL  Take 1 tablet (100 mg total) by mouth at bedtime as needed for sleep.   Indication:  Trouble Sleeping           Follow-up Information    Follow up with [facility] On 01/09/2016.   Why:  at 12:30pm for your hospital discharge appointment. Please ask for Dub Mikes when you arrive.   Contact information:   [address], [place] [address] Phone: [phone] Fax: [phone]      Follow-up recommendations:  Activity:  as tol Diet:  as tol  Comments:  1.  Take all your medications as prescribed.              2.  Report any adverse side effects to outpatient provider.                       3.  Patient instructed to not use alcohol or illegal drugs while on prescription medicines.            4.  In the event of worsening symptoms, instructed patient to call 911, the crisis hotline or go to nearest emergency room for evaluation of symptoms.  Signed: Freda Munro May Agustin AGNP-BC 01/08/2016, 2:14 PM  Patient seen, Suicide Assessment Completed.  Disposition Plan Reviewed

## 2016-01-08 NOTE — [facility] Suicide Risk Assessment (Signed)
[facility] Discharge Suicide Risk Assessment   Demographic Factors:  55 year old male , widowed, three adult sons, lives alone , employed   Total Time spent with patient: 30 minutes  Musculoskeletal: Strength & Muscle Tone: within normal limits Gait & Station: normal Patient leans: N/A  Psychiatric Specialty Exam: Physical Exam  ROS patient has reported recent upper respiratory symptoms / " cold " symptoms- no fever, no chills, no SOB   Blood pressure 109/55, pulse 80, temperature 98.5 F (36.9 C), temperature source Oral, resp. rate 16, height 6' (1.829 m), weight 270 lb (122.471 kg), SpO2 97 %.Body mass index is 36.61 kg/(m^2).  General Appearance: Well Groomed  Eye Contact::  Good  Speech:  Normal Rate409  Volume:  Normal  Mood:  improved, less depressed   Affect:  less constricted, more reactive   Thought Process:  Linear  Orientation:  Full (Time, Place, and Person)  Thought Content:  denies any hallucinations, no delusions   Suicidal Thoughts:  No- denies any suicidal ideations, denies self injurious ideations  Homicidal Thoughts:  No  Memory:  recent and remote grossly intact   Judgement:  Other:  improved   Insight:  Present  Psychomotor Activity:  Normal  Concentration:  Good  Recall:  Good  Fund of Knowledge:Good  Language: Good  Akathisia:  Negative  Handed:  Right  AIMS (if indicated):     Assets:  Communication Skills Desire for Improvement Resilience  Sleep:  Number of Hours: 6.5  Cognition: WNL  ADL's:  Intact   Have you used any form of tobacco in the last 30 days? (Cigarettes, Smokeless Tobacco, Cigars, and/or Pipes): No  Has this patient used any form of tobacco in the last 30 days? (Cigarettes, Smokeless Tobacco, Cigars, and/or Pipes) No  Mental Status Per Nursing Assessment::   On Admission:  Suicidal ideation indicated by patient  Current Mental Status by Physician: At this time patient improved, presents with improved mood and improved range of  affect- no thought disorder, denies any suicidal ideations, denies any homicidal ideations, no psychotic symptoms, more optimistic and future oriented- states he plans to continue looking for a job and feels more confident that he will get a better job soon.   Loss Factors: Financial stressors .   Historical Factors: Depression, no history of suicide attempts   Risk Reduction Factors:   Sense of responsibility to family and Positive coping skills or problem solving skills  Continued Clinical Symptoms:  As noted currently improved compared to admission- not currently suicidal , more future oriented, planning to continue looking for a job that is more advantageous for him  Cognitive Features That Contribute To Risk:  No gross cognitive deficits noted upon discharge. Is alert , attentive, and oriented x 3   Suicide Risk:  Mild:  Suicidal ideation of limited frequency, intensity, duration, and specificity.  There are no identifiable plans, no associated intent, mild dysphoria and related symptoms, good self-control (both objective and subjective assessment), few other risk factors, and identifiable protective factors, including available and accessible social support.  Principal Problem: MDD (major depressive disorder), recurrent severe, without psychosis [facility]) Discharge Diagnoses:  Patient Active Problem List   Diagnosis Date Noted  . MDD (major depressive disorder), recurrent severe, without psychosis ([place]) [F33.2] 12/27/2015  . Ventral incisional hernia [K43.2] 01/29/2015  . Perirectal abscess [K61.1] 02/18/2012  . Ventral hernia [K43.9] 07/23/2011  . SPONDYLOSIS [M47.9] 09/19/2008  . CERVICALGIA [M54.2] 09/19/2008  . LOW BACK PAIN [M54.5] 09/19/2008  Follow-up Information    Follow up with [facility] On 01/09/2016.   Why:  at 12:30pm for your hospital discharge appointment. Please ask for Dub Mikes when you arrive.   Contact information:   [address] [place], [place] [address] Phone: [phone] Fax: [phone]      Plan Of Care/Follow-up recommendations:  Activity:  as tolerated  Diet:  Heart Healthy Tests:  NA Other:  see below  Is patient on multiple antipsychotic therapies at discharge:  No   Has Patient had three or more failed trials of antipsychotic monotherapy by history:  No  Recommended Plan for Multiple Antipsychotic Therapies: NA  At this time leaving unit in good spirits . Plans to return home. Follow up as above .  COBOS, FERNANDO 01/08/2016, 11:35 AM

## 2016-01-08 NOTE — Progress Notes (Signed)
Adult Psychoeducational Group Note  Date:  01/08/2016 Time:  1:39 AM  Group Topic/Focus:  Wrap-Up Group:   The focus of this group is to help patients review their daily goal of treatment and discuss progress on daily workbooks.  Participation Level:  Active  Participation Quality:  Appropriate  Affect:  Appropriate  Cognitive:  Appropriate  Insight: Good  Engagement in Group:  Engaged  Modes of Intervention:  Activity  Additional Comments:  Patient rated his day a 46. Goal is to embrace his days and get ready for discharge tomorrow.  Gloriajean [name] Shiron Whetsel 01/08/2016, 1:39 AM

## 2016-02-26 ENCOUNTER — Other Ambulatory Visit: Payer: Self-pay | Admitting: Family Medicine

## 2016-02-26 ENCOUNTER — Ambulatory Visit
Admission: RE | Admit: 2016-02-26 | Discharge: 2016-02-26 | Disposition: A | Discharge status: Home / Self Care | Payer: No Typology Code available for payment source | Source: Ambulatory Visit | Attending: Family Medicine | Admitting: Family Medicine

## 2016-02-26 DIAGNOSIS — R059 Cough, unspecified: Secondary | ICD-10-CM

## 2016-02-26 DIAGNOSIS — R05 Cough: Secondary | ICD-10-CM

## 2016-05-29 DIAGNOSIS — F3110 Bipolar disorder, current episode manic without psychotic features, unspecified: Secondary | ICD-10-CM

## 2016-05-29 HISTORY — DX: Bipolar disorder, current episode manic without psychotic features, unspecified: F31.10

## 2016-09-17 ENCOUNTER — Other Ambulatory Visit (HOSPITAL_BASED_OUTPATIENT_CLINIC_OR_DEPARTMENT_OTHER): Payer: Self-pay

## 2016-09-17 DIAGNOSIS — G4730 Sleep apnea, unspecified: Secondary | ICD-10-CM

## 2016-09-17 DIAGNOSIS — R5383 Other fatigue: Secondary | ICD-10-CM

## 2016-09-17 DIAGNOSIS — G4710 Hypersomnia, unspecified: Secondary | ICD-10-CM

## 2016-09-17 DIAGNOSIS — R0683 Snoring: Secondary | ICD-10-CM

## 2016-09-25 ENCOUNTER — Ambulatory Visit (HOSPITAL_BASED_OUTPATIENT_CLINIC_OR_DEPARTMENT_OTHER): Payer: BLUE CROSS/BLUE SHIELD | Attending: Family Medicine | Admitting: Internal Medicine

## 2016-09-25 VITALS — Ht 72.0 in | Wt 270.0 lb

## 2016-09-25 DIAGNOSIS — R5383 Other fatigue: Secondary | ICD-10-CM | POA: Insufficient documentation

## 2016-09-25 DIAGNOSIS — R0683 Snoring: Secondary | ICD-10-CM | POA: Diagnosis present

## 2016-09-25 DIAGNOSIS — G4733 Obstructive sleep apnea (adult) (pediatric): Secondary | ICD-10-CM | POA: Diagnosis not present

## 2016-09-25 DIAGNOSIS — G4710 Hypersomnia, unspecified: Secondary | ICD-10-CM

## 2016-09-25 DIAGNOSIS — R400 Somnolence: Secondary | ICD-10-CM | POA: Diagnosis not present

## 2016-09-25 DIAGNOSIS — G4730 Sleep apnea, unspecified: Secondary | ICD-10-CM

## 2016-10-10 NOTE — Procedures (Signed)
Patient Name: Gary Grimes, Gary Grimes Date: 09/25/2016 Gender: Male D.O.B: [birth year] Age (years): 55 Referring Provider: Shirline Frees Height (inches): 1 Interpreting Physician: Baird Lyons MD, ABSM Weight (lbs): 270 RPSGT: Baxter Flattery BMI: 39 MRN: Neck Size: 18.00 CLINICAL INFORMATION Sleep Study Type: Split Night CPAP Indication for sleep study: Excessive Daytime Sleepiness, Fatigue, Obesity, OSA, Snoring, Witnessed Apneas Epworth Sleepiness Score: 6  SLEEP STUDY TECHNIQUE As per the AASM Manual for the Scoring of Sleep and Associated Events v2.3 (April 2016) with a hypopnea requiring 4% desaturations. The channels recorded and monitored were frontal, central and occipital EEG, electrooculogram (EOG), submentalis EMG (chin), nasal and oral airflow, thoracic and abdominal wall motion, anterior tibialis EMG, snore microphone, electrocardiogram, and pulse oximetry. Continuous positive airway pressure (CPAP) was initiated when the patient met split night criteria and was titrated according to treat sleep-disordered breathing.  MEDICATIONS Medications taken by the patient charted for review Medications administered by patient during sleep study : Sleep medicine administered - Unspecified at 09:00:16 PM, TRAZODONE  RESPIRATORY PARAMETERS Diagnostic Total AHI (/hr): 19.2 RDI (/hr): 23.0 OA Index (/hr): 7.8 CA Index (/hr): 0.0 REM AHI (/hr): 80.0 NREM AHI (/hr): 13.9 Supine AHI (/hr): 19.2 Non-supine AHI (/hr): N/A Min O2 Sat (%): 71.00 Mean O2 (%): 94.29 Time below 88% (min): 9.9   Titration Optimal Pressure (cm): 13 AHI at Optimal Pressure (/hr): 0.0 Min O2 at Optimal Pressure (%): 86.0 Supine % at Optimal (%): 100 Sleep % at Optimal (%): 100    SLEEP ARCHITECTURE The recording time for the entire night was 395.8 minutes. During a baseline period of 217.4 minutes, the patient slept for 206.5 minutes in REM and nonREM, yielding a sleep efficiency of 95.0%. Sleep onset after  lights out was 3.5 minutes with a REM latency of 189.0 minutes. The patient spent 2.66% of the night in stage N1 sleep, 89.35% in stage N2 sleep, 0.00% in stage N3 and 7.99% in REM. During the titration period of 173.4 minutes, the patient slept for 160.3 minutes in REM and nonREM, yielding a sleep efficiency of 92.4%. Sleep onset after CPAP initiation was 9.6 minutes with a REM latency of 85.0 minutes. The patient spent 4.99% of the night in stage N1 sleep, 46.48% in stage N2 sleep, 0.00% in stage N3 and 48.53% in REM.  CARDIAC DATA The 2 lead EKG demonstrated sinus rhythm. The mean heart rate was 50.56 beats per minute. Other EKG findings include: None.  LEG MOVEMENT DATA The total Periodic Limb Movements of Sleep (PLMS) were 0. The PLMS index was 0.00 .  IMPRESSIONS - Moderate obstructive sleep apnea occurred during the diagnostic portion of the study(AHI = 19.2/hour). An optimal PAP pressure was selected for this patient ( 13 cm of water) - No significant central sleep apnea occurred during the diagnostic portion of the study (CAI = 0.0/hour). - Moderate oxygen desaturation was noted during the diagnostic portion of the study (Min O2 =71.00%). - Moderate snoring was audible during the diagnostic portion of the study. - No cardiac abnormalities were noted during this study. - Clinically significant periodic limb movements did not occur during sleep.  DIAGNOSIS - Obstructive Sleep Apnea (327.23 [G47.33 ICD-10])  RECOMMENDATIONS - Trial of CPAP therapy on 13 cm H2O with a Large size Resmed Full Face Mask AirFit F20 mask and heated humidification. - Avoid alcohol, sedatives and other CNS depressants that may worsen sleep apnea and disrupt normal sleep architecture. - Sleep hygiene should be reviewed to assess factors that may improve sleep quality. -  Weight management and regular exercise should be initiated or continued.  [Electronically signed] 10/10/2016 10:44 AM  Baird Lyons MD,  ABSM Diplomate, American Board of Sleep Medicine   NPI: [id number]  [place], American Board of Sleep Medicine  ELECTRONICALLY SIGNED ON:  10/10/2016, 10:45 AM [place] PH: [phone]   FX: [phone] [place]

## 2017-04-05 DIAGNOSIS — L03311 Cellulitis of abdominal wall: Secondary | ICD-10-CM | POA: Insufficient documentation

## 2017-08-26 ENCOUNTER — Encounter: Payer: BLUE CROSS/BLUE SHIELD | Attending: Family Medicine | Admitting: Dietician

## 2017-08-26 ENCOUNTER — Encounter: Payer: Self-pay | Admitting: Dietician

## 2017-08-26 DIAGNOSIS — Z713 Dietary counseling and surveillance: Secondary | ICD-10-CM | POA: Insufficient documentation

## 2017-08-26 DIAGNOSIS — E119 Type 2 diabetes mellitus without complications: Secondary | ICD-10-CM | POA: Insufficient documentation

## 2017-08-26 NOTE — Progress Notes (Signed)
Patient was seen on 08/26/17 for the first of a series of three diabetes self-management courses at [facility].  Patient Education Plan per assessed needs and concerns is to attend four course education program for Diabetes Self Management Education.  The following learning objectives were met by the patient during this class:  Describe diabetes  State some common risk factors for diabetes  Defines the role of glucose and insulin  Identifies type of diabetes and pathophysiology  Describe the relationship between diabetes and cardiovascular risk  State the members of the Healthcare Team  States the rationale for glucose monitoring  State when to test glucose  State their individual Target Range  State the importance of logging glucose readings  Describe how to interpret glucose readings  Identifies A1C target  Explain the correlation between A1c and eAG values  State symptoms and treatment of high blood glucose  State symptoms and treatment of low blood glucose  Explain proper technique for glucose testing  Identifies proper sharps disposal  Handouts given during class include:  Living Well with Diabetes book  Carb Counting and Meal Planning book  Meal Plan Card  Carbohydrate guide  Meal planning worksheet  Low Sodium Flavoring Tips  The diabetes portion plate  E4L to eAG Conversion Chart  Diabetes Medications  Diabetes Recommended Care Schedule  Support Group  Diabetes Success Plan  Core Class Satisfaction Survey   . The patient's Newest Vital Sign Health Literacy Assessment score was 1. . The patient scored 72% on the Diabetes Knowledge pre-test.  Research packet #57. . Educational strategies utilized during class included repetition, teach-back, eliminating medical jargon, and being open to questions.   Follow-Up Plan:  Attend core 2

## 2017-09-02 ENCOUNTER — Encounter: Payer: BLUE CROSS/BLUE SHIELD | Attending: Family Medicine | Admitting: Dietician

## 2017-09-02 DIAGNOSIS — E119 Type 2 diabetes mellitus without complications: Secondary | ICD-10-CM | POA: Insufficient documentation

## 2017-09-02 DIAGNOSIS — Z713 Dietary counseling and surveillance: Secondary | ICD-10-CM | POA: Insufficient documentation

## 2017-09-02 NOTE — Progress Notes (Signed)
Patient was seen on 09/02/17 for the second of a series of three diabetes self-management courses at the [facility]. The following learning objectives were met by the patient during this class:   Describe the role of different macronutrients on glucose  Explain how carbohydrates affect blood glucose  State what foods contain the most carbohydrates  Demonstrate carbohydrate counting  Demonstrate how to read Nutrition Facts food label  Describe effects of various fats on heart health  Describe the importance of good nutrition for health and healthy eating strategies  Describe techniques for managing your shopping, cooking and meal planning  List strategies to follow meal plan when dining out  Describe the effects of alcohol on glucose and how to use it safely  Goals:  Follow Diabetes Meal Plan as instructed  Aim to spread carbs evenly throughout the day  Aim for 3 meals per day and snacks as needed Include lean protein foods to meals/snacks  Monitor glucose levels as instructed by your doctor   Follow-Up Plan:  Attend Core 3  Work towards following your personal food plan.   

## 2017-09-09 ENCOUNTER — Ambulatory Visit: Payer: Self-pay

## 2017-09-30 ENCOUNTER — Encounter: Payer: Self-pay | Admitting: Dietician

## 2017-09-30 ENCOUNTER — Encounter: Payer: BLUE CROSS/BLUE SHIELD | Attending: Family Medicine | Admitting: Dietician

## 2017-09-30 DIAGNOSIS — E119 Type 2 diabetes mellitus without complications: Secondary | ICD-10-CM | POA: Insufficient documentation

## 2017-09-30 DIAGNOSIS — Z713 Dietary counseling and surveillance: Secondary | ICD-10-CM | POA: Insufficient documentation

## 2017-09-30 NOTE — Progress Notes (Signed)
Patient was seen on 09/30/17 for the third of a series of three diabetes self-management courses at the [facility].   Catalina Gravel the amount of activity recommended for healthy living . Describe activities suitable for individual needs . Identify ways to regularly incorporate activity into daily life . Identify barriers to activity and ways to over come these barriers  Identify diabetes medications being personally used and their primary action for lowering glucose and possible side effects . Describe role of stress on blood glucose and develop strategies to address psychosocial issues . Identify diabetes complications and ways to prevent them  Explain how to manage diabetes during illness . Evaluate success in meeting personal goal . Establish 2-3 goals that they will plan to diligently work on until they return for the  6-month follow-up visit  Goals:   I will count my carb choices at most meals and snacks  I will take my diabetes medications as scheduled  I will test my glucose at least 3 days a week  Your patient has identified these potential barriers to change:  Motivation  Your patient has identified their diabetes self-care support plan as  Family Support    Plan:  Attend Support Group as desired

## 2018-06-06 ENCOUNTER — Ambulatory Visit (INDEPENDENT_AMBULATORY_CARE_PROVIDER_SITE_OTHER): Payer: BLUE CROSS/BLUE SHIELD | Admitting: Cardiology

## 2018-06-06 ENCOUNTER — Encounter: Payer: Self-pay | Admitting: Cardiology

## 2018-06-06 ENCOUNTER — Ambulatory Visit (HOSPITAL_BASED_OUTPATIENT_CLINIC_OR_DEPARTMENT_OTHER)
Admission: RE | Admit: 2018-06-06 | Discharge: 2018-06-06 | Disposition: A | Discharge status: Home / Self Care | Payer: Self-pay | Source: Ambulatory Visit | Attending: Cardiology | Admitting: Cardiology

## 2018-06-06 ENCOUNTER — Telehealth: Payer: Self-pay

## 2018-06-06 VITALS — BP 132/68 | HR 62 | Ht 72.0 in | Wt 293.0 lb

## 2018-06-06 DIAGNOSIS — E1140 Type 2 diabetes mellitus with diabetic neuropathy, unspecified: Secondary | ICD-10-CM | POA: Insufficient documentation

## 2018-06-06 DIAGNOSIS — E088 Diabetes mellitus due to underlying condition with unspecified complications: Secondary | ICD-10-CM

## 2018-06-06 DIAGNOSIS — E663 Overweight: Secondary | ICD-10-CM

## 2018-06-06 DIAGNOSIS — Z01810 Encounter for preprocedural cardiovascular examination: Secondary | ICD-10-CM

## 2018-06-06 DIAGNOSIS — I209 Angina pectoris, unspecified: Secondary | ICD-10-CM

## 2018-06-06 DIAGNOSIS — E1165 Type 2 diabetes mellitus with hyperglycemia: Secondary | ICD-10-CM | POA: Insufficient documentation

## 2018-06-06 DIAGNOSIS — I10 Essential (primary) hypertension: Secondary | ICD-10-CM | POA: Insufficient documentation

## 2018-06-06 DIAGNOSIS — E119 Type 2 diabetes mellitus without complications: Secondary | ICD-10-CM | POA: Insufficient documentation

## 2018-06-06 DIAGNOSIS — Z8679 Personal history of other diseases of the circulatory system: Secondary | ICD-10-CM | POA: Insufficient documentation

## 2018-06-06 DIAGNOSIS — I480 Paroxysmal atrial fibrillation: Secondary | ICD-10-CM | POA: Insufficient documentation

## 2018-06-06 DIAGNOSIS — G4730 Sleep apnea, unspecified: Secondary | ICD-10-CM

## 2018-06-06 DIAGNOSIS — E1159 Type 2 diabetes mellitus with other circulatory complications: Secondary | ICD-10-CM | POA: Insufficient documentation

## 2018-06-06 DIAGNOSIS — I482 Chronic atrial fibrillation, unspecified: Secondary | ICD-10-CM | POA: Insufficient documentation

## 2018-06-06 DIAGNOSIS — Z794 Long term (current) use of insulin: Secondary | ICD-10-CM

## 2018-06-06 DIAGNOSIS — Z01818 Encounter for other preprocedural examination: Secondary | ICD-10-CM | POA: Insufficient documentation

## 2018-06-06 HISTORY — DX: Personal history of other diseases of the circulatory system: Z86.79

## 2018-06-06 HISTORY — DX: Overweight: E66.3

## 2018-06-06 HISTORY — DX: Sleep apnea, unspecified: G47.30

## 2018-06-06 HISTORY — DX: Type 2 diabetes mellitus with diabetic neuropathy, unspecified: E11.40

## 2018-06-06 HISTORY — DX: Type 2 diabetes mellitus with hyperglycemia: E11.65

## 2018-06-06 HISTORY — DX: Long term (current) use of insulin: Z79.4

## 2018-06-06 HISTORY — DX: Type 2 diabetes mellitus without complications: E11.9

## 2018-06-06 HISTORY — DX: Essential (primary) hypertension: I10

## 2018-06-06 HISTORY — DX: Chronic atrial fibrillation, unspecified: I48.20

## 2018-06-06 MED ORDER — NITROGLYCERIN 0.4 MG SL SUBL: 0.4000 mg | SUBLINGUAL_TABLET | SUBLINGUAL | 11 refills | Status: DC | PRN

## 2018-06-06 NOTE — Addendum Note (Signed)
Addended by: Stevan Born on: 06/06/2018 10:18 AM   Modules accepted: Orders

## 2018-06-06 NOTE — Patient Instructions (Signed)
Medication Instructions:  Your physician has recommended you make the following change in your medication:  START: Nitroglycerin 0.4mg  sublingual (under the tongue) as needed for chest pain  When having chest pain, stop what you are doing and sit down. Take 1 nitro, wait 5 minutes. Still having chest pain, take 1 nitro, wait 5 minutes. Still having chest pain, take 1 nitro, dial 911. Total of 3 nitro in 15 minutes.     Labwork: Your physician recommends that you have the following labs drawn:CBC and BMP  Testing/Procedures: A chest x-ray takes a picture of the organs and structures inside the chest, including the heart, lungs, and blood vessels. This test can show several things, including, whether the heart is enlarges; whether fluid is building up in the lungs; and whether pacemaker / defibrillator leads are still in place.  Your physician has requested that you have an echocardiogram. Echocardiography is a painless test that uses sound waves to create images of your heart. It provides your doctor with information about the size and shape of your heart and how well your heart's chambers and valves are working. This procedure takes approximately one hour. There are no restrictions for this procedure.  Your physician has recommended that you wear an event monitor. Event monitors are medical devices that record the heart's electrical activity. Doctors most often [name] these monitors to diagnose arrhythmias. Arrhythmias are problems with the speed or rhythm of the heartbeat. The monitor is a small, portable device. You can wear one while you do your normal daily activities. This is usually used to diagnose what is causing palpitations/syncope (passing out).   Your physician has requested that you have a cardiac catheterization. Cardiac catheterization is used to diagnose and/or treat various heart conditions. Doctors may recommend this procedure for a number of different reasons. The most common  reason is to evaluate chest pain. Chest pain can be a symptom of coronary artery disease (CAD), and cardiac catheterization can show whether plaque is narrowing or blocking your heart's arteries. This procedure is also used to evaluate the valves, as well as measure the blood flow and oxygen levels in different parts of your heart. For further information please visit [email or web address][employer][email or web address]. Please follow instruction sheet, as given.    [facility] [address], [place] [address] Dept: [phone] Loc: [place]  06/06/2018  You are scheduled for a Cardiac Catheterization on Wednesday, June 12 with Dr. Glenetta Hew.  1. Please arrive at the [facility] (Main Entrance A) at [facility]: [address] [place], [place] [address] at 6:30 AM (two hours before your procedure to ensure your preparation). Free valet parking service is available.   Special note: Every effort is made to have your procedure done on time. Please understand that emergencies sometimes delay scheduled procedures.  2. Diet: Do not eat or drink anything after midnight prior to your procedure except sips of water to take medications.  3. Labs: None needed. You will have done today  4. Medication instructions in preparation for your procedure:  Stop taking Eliquis (Apixiban) on Monday, June 10.  Stop taking, glimeperide on Wednesday, June 12.     On the morning of your procedure, take your Aspirin and any morning medicines NOT listed above.  You may use sips of water.  5. Plan for one night stay--bring personal belongings. 6. Bring a current list of your medications and current  insurance cards. 7. You MUST have a responsible person to drive you home. 8. Someone MUST be with you the first 24 hours after you arrive home or your discharge will be delayed. 9. Please wear clothes that are easy to  get on and off and wear slip-on shoes.  Thank you for allowing [name] to care for you!   -- [facility]     Follow-Up: Your physician recommends that you schedule a follow-up appointment in:    Any Other Special Instructions Will Be Listed Below (If Applicable).     If you need a refill on your cardiac medications before your next appointment, please call your pharmacy.

## 2018-06-06 NOTE — Telephone Encounter (Signed)
Patient advised that due to his GFR being 60 he should hold losartan on Tues 6-11 and Wed 6-12 prior to [facility].  This was confirmed with procedure nurse.  Patient agrees to plan, and verbalized understanding.

## 2018-06-06 NOTE — Telephone Encounter (Signed)
Patient called back to inform that he failed to mention he is taking Humalog (sliding scale).  He is concerned with instructions for holding Humalog prior to left heart cath.  Patient advised to hold Humalog on the morning of June 08, 2018 along with oral dose of glimeperide.    Patient did not have sliding scale dosage nearby to verify dose so that we could document Humalog doseage.  Patient advised to bring Humalog sliding scale to hospital on the morning of the procedure for proper documentation.  Patient verbalized understanding.

## 2018-06-06 NOTE — Progress Notes (Signed)
Cardiology Office Note:    Date:  06/06/2018   ID:  Gary Grimes, DOB [birth year], MRN [id number]  PCP:  Shirline Frees, MD  Cardiologist:  Jenean Lindau, MD   Referring MD: Shirline Frees, MD    ASSESSMENT:    1. Angina pectoris ([place])   2. Paroxysmal atrial fibrillation ([place])   3. Essential hypertension   4. Diabetes mellitus due to underlying condition with complication, without long-term current use of insulin ([place])   5. Sleep apnea, unspecified type   6. Overweight    PLAN:    In order of problems listed above:  1. Primary prevention stressed with the patient.  Importance of compliance with diet and medications stressed and he vocalized understanding. 2. I discussed with the patient atrial fibrillation, disease process. Management and therapy including rate and rhythm control, anticoagulation benefits and potential risks were discussed extensively with the patient. Patient had multiple questions which were answered to patient's satisfaction. 3. We are in the process of obtaining that EKG which revealed atrial fibrillation. 4. His blood pressure is stable.  Diet was discussed for obesity, diabetes mellitus.  His primary care doctor is following his lipids and he will need to be on statin because he is a diabetic. 5. I will try to do an event monitoring on him to assess his atrial fibrillation. 6. Echocardiogram will be done to assess murmur heard on auscultation. 7. In view of the patient's symptoms, I discussed with the patient options for evaluation. Invasive and noninvasive options were given to the patient. I discussed stress testing and coronary angiography and left heart catheterization at length. Benefits, pros and cons of each approach were discussed at length. Patient had multiple questions which were answered to the patient's satisfaction. Patient opted for invasive evaluation and we will set up for coronary angiography and left heart catheterization. Further  recommendations will be made based on the findings with coronary angiography. In the interim if the patient has any significant symptoms in hospital to the nearest emergency room. 8. The patient's symptoms are very concerning.  In addition coronary angiography will help me decide appropriately on treatment and management plans for his atrial fibrillation.  I also told him that he needs to be in touch with his sleep health doctor to be compliant with that therapy as it will affect his atrial fibrillation therapy significantly.  He vocalized understanding. 9. Patient will be seen in follow-up appointment in 6 months or earlier if the patient has any concerns    Medication Adjustments/Labs and Tests Ordered: Current medicines are reviewed at length with the patient today.  Concerns regarding medicines are outlined above.  Orders Placed This Encounter  Procedures  . EKG 12-Lead   No orders of the defined types were placed in this encounter.    History of Present Illness:    Gary Grimes is a 56 y.o. male who is being seen today for the evaluation of paroxysmal atrial fibrillation at the request of Shirline Frees, MD.  Patient is a pleasant 57 year old male.  He has past medical history of essential hypertension, diabetes mellitus, obesity and recently diagnosed paroxysmal atrial fibrillation.  He also has history of sleep apnea.  He has not been using his sleep apnea machine regularly.  He has been on anticoagulation since his diagnosis.  We are trying to get a copy of the EKG.  Upon further discussion he mentions to me that he leads a sedentary lifestyle and occasionally has chest tightness going to  the neck.  No orthopnea or PND.  He does not exercise on a regular basis.  He is here for the evaluation of these problems.  At the time of my evaluation, the patient is alert awake oriented and in no distress.  Past Medical History:  Diagnosis Date  . Anginal pain ([place])    ER visit 01/14/2014  visit on chart   . Anxiety    pt denies  . Arthritis   . Depression    did get seen in er 4/13 for evaluation-psyc  . Diabetes mellitus without complication ([place])   . GERD (gastroesophageal reflux disease)   . High cholesterol   . High triglycerides   . Hypertension   . Levator syndrome 2001   history   . Panic attacks   . Perirectal abscess   . Sleep apnea    pt has CPAP machine but does not use due to mask     Past Surgical History:  Procedure Laterality Date  . ANAL FISSURE REPAIR  08/05/2000   proctoscopy  . APPENDECTOMY  1984  . colonscopy     . INSERTION OF MESH N/A 01/29/2015   Procedure: INSERTION OF MESH;  Surgeon: Excell Seltzer, MD;  Location: [facility];  Service: General;  Laterality: N/A;  . IRRIGATION AND DEBRIDEMENT ABSCESS  02/18/2012   peri-rectal  . SHOULDER ARTHROSCOPY W/ LABRAL REPAIR  08/08/2007   left  . UMBILICAL HERNIA REPAIR  10/27/2010  . VENTRAL HERNIA REPAIR N/A 01/29/2015   Procedure: LAPAROSCOPIC VENTRAL HERNIA;  Surgeon: Excell Seltzer, MD;  Location: [facility];  Service: General;  Laterality: N/A;    Current Medications: Current Meds  Medication Sig  . apixaban (ELIQUIS) 5 MG TABS tablet Take 5 mg by mouth 2 (two) times daily.  Marland Kitchen gabapentin (NEURONTIN) 300 MG capsule Take 1 capsule (300 mg total) by mouth 2 (two) times daily. (Patient taking differently: Take 300 mg by mouth daily. )  . glimepiride (AMARYL) 2 MG tablet Take 2 mg by mouth daily with breakfast.  . guaiFENesin (MUCINEX) 600 MG 12 hr tablet Take 2 tablets (1,200 mg total) by mouth 2 (two) times daily as needed for cough or to loosen phlegm.  Marland Kitchen lithium carbonate (LITHOBID) 300 MG CR tablet Take 3 tablets by mouth at bedtime.  Marland Kitchen LORazepam (ATIVAN) 1 MG tablet Take 1 tablet by mouth daily.  Marland Kitchen losartan (COZAAR) 100 MG tablet Take 1 tablet (100 mg total) by mouth every evening.  . metoprolol tartrate (LOPRESSOR) 25 MG tablet Take 1 tablet by mouth 2 (two) times daily with a meal.  .  omeprazole (PRILOSEC) 40 MG capsule Take 1 capsule by mouth as needed.  . sertraline (ZOLOFT) 50 MG tablet Take 50 mg by mouth daily.  . traZODone (DESYREL) 100 MG tablet Take 1 tablet (100 mg total) by mouth at bedtime as needed for sleep.  . [DISCONTINUED] FLUoxetine (PROZAC) 40 MG capsule Take 1 capsule (40 mg total) by mouth daily.     Allergies:   Morphine   Social History   Socioeconomic History  . Marital status: Widowed    Spouse name: Not on file  . Number of children: Not on file  . Years of education: Not on file  . Highest education level: Not on file  Occupational History  . Not on file  Social Needs  . Financial resource strain: Not on file  . Food insecurity:    Worry: Not on file    Inability: Not on file  .  Transportation needs:    Medical: Not on file    Non-medical: Not on file  Tobacco Use  . Smoking status: Former Smoker    Packs/day: 0.50    Years: 1.00    Pack years: 0.50    Types: Cigarettes    Last attempt to quit: 07/06/1977    Years since quitting: 40.9  . Smokeless tobacco: Never Used  Substance and Sexual Activity  . 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 Drug use: No  . Sexual activity: Not on file  Lifestyle  . Physical activity:    Days per week: Not on file    Minutes per session: Not on file  . Stress: Not on file  Relationships  . Social connections:    Talks on phone: Not on file    Gets together: Not on file    Attends religious service: Not on file    Active member of club or organization: Not on file    Attends meetings of clubs or organizations: Not on file    Relationship status: Not on file  Other Topics Concern  . Not on file  Social History Narrative  . Not on file     Family History: The patient's family history includes Cancer in his mother.  ROS:   Please see the history of present illness.    All other systems reviewed and are  negative.  EKGs/Labs/Other Studies Reviewed:    The following studies were reviewed today: EKG done today reveals sinus rhythm and nonspecific ST-T changes.  We will try to obtain a copy of the EKG from his primary care physician.   Recent Labs: No results found for requested labs within last 8760 hours.  Recent Lipid Panel No results found for: CHOL, TRIG, HDL, CHOLHDL, VLDL, LDLCALC, LDLDIRECT  Physical Exam:    VS:  BP 132/68 (BP Location: Left Arm, Patient Position: Sitting, Cuff Size: Normal)   Pulse 62   Ht 6' (1.829 m)   Wt 293 lb (132.9 kg)   SpO2 98%   BMI 39.74 kg/m     Wt Readings from Last 3 Encounters:  06/06/18 293 lb (132.9 kg)  09/25/16 270 lb (122.5 kg)  12/26/15 270 lb (122.5 kg)     GEN: Patient is in no acute distress HEENT: Normal NECK: No JVD; No carotid bruits LYMPHATICS: No lymphadenopathy CARDIAC: S1 S2 regular, 2/6 systolic murmur at the apex. RESPIRATORY:  Clear to auscultation without rales, wheezing or rhonchi  ABDOMEN: Soft, non-tender, non-distended MUSCULOSKELETAL:  No edema; No deformity  SKIN: Warm and dry NEUROLOGIC:  Alert and oriented x 3 PSYCHIATRIC:  Normal affect    Signed, Jenean Lindau, MD  06/06/2018 9:31 AM    [place]

## 2018-06-06 NOTE — H&P (View-Only) (Signed)
Cardiology Office Note:    Date:  06/06/2018   ID:  Gary Grimes, DOB 1961/09/24, MRN [id number]  PCP:  Shirline Frees, MD  Cardiologist:  Jenean Lindau, MD   Referring MD: Shirline Frees, MD    ASSESSMENT:    1. Angina pectoris ([place])   2. Paroxysmal atrial fibrillation ([place])   3. Essential hypertension   4. 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 Sleep apnea, unspecified type   6. Overweight    PLAN:    In order of problems listed above:  1. Primary prevention stressed with the patient.  Importance of compliance with diet and medications stressed and he vocalized understanding. 2. I discussed with the patient atrial fibrillation, disease process. Management and therapy including rate and rhythm control, anticoagulation benefits and potential risks were discussed extensively with the patient. Patient had multiple questions which were answered to patient's satisfaction. 3. We are in the process of obtaining that EKG which revealed atrial fibrillation. 4. His blood pressure is stable.  Diet was discussed for obesity, diabetes mellitus.  His primary care doctor is following his lipids and he will need to be on statin because he is a diabetic. 5. I will try to do an event monitoring on him to assess his atrial fibrillation. 6. Echocardiogram will be done to assess murmur heard on auscultation. 7. In view of the patient's symptoms, I discussed with the patient options for evaluation. Invasive and noninvasive options were given to the patient. I discussed stress testing and coronary angiography and left heart catheterization at length. Benefits, pros and cons of each approach were discussed at length. Patient had multiple questions which were answered to the patient's satisfaction. Patient opted for invasive evaluation and we will set up for coronary angiography and left heart catheterization. Further  recommendations will be made based on the findings with coronary angiography. In the interim if the patient has any significant symptoms in hospital to the nearest emergency room. 8. The patient's symptoms are very concerning.  In addition coronary angiography will help me decide appropriately on treatment and management plans for his atrial fibrillation.  I also told him that he needs to be in touch with his sleep health doctor to be compliant with that therapy as it will affect his atrial fibrillation therapy significantly.  He vocalized understanding. 9. Patient will be seen in follow-up appointment in 6 months or earlier if the patient has any concerns    Medication Adjustments/Labs and Tests Ordered: Current medicines are reviewed at length with the patient today.  Concerns regarding medicines are outlined above.  Orders Placed This Encounter  Procedures  . EKG 12-Lead   No orders of the defined types were placed in this encounter.    History of Present Illness:    Gary Grimes is a 57 y.o. male who is being seen today for the evaluation of paroxysmal atrial fibrillation at the request of Shirline Frees, MD.  Patient is a pleasant 57 year old male.  He has past medical history of essential hypertension, diabetes mellitus, obesity and recently diagnosed paroxysmal atrial fibrillation.  He also has history of sleep apnea.  He has not been using his sleep apnea machine regularly.  He has been on anticoagulation since his diagnosis.  We are trying to get a copy of the EKG.  Upon further discussion he mentions to me that he leads a sedentary lifestyle and occasionally has chest tightness going to  the neck.  No orthopnea or PND.  He does not exercise on a regular basis.  He is here for the evaluation of these problems.  At the time of my evaluation, the patient is alert awake oriented and in no distress.  Past Medical History:  Diagnosis Date  . Anginal pain ([place])    ER visit 01/14/2014  visit on chart   . Anxiety    pt denies  . Arthritis   . Depression    did get seen in er 4/13 for evaluation-psyc  . Diabetes mellitus without complication ([place])   . GERD (gastroesophageal reflux disease)   . High cholesterol   . High triglycerides   . Hypertension   . Levator syndrome 2001   history   . Panic attacks   . Perirectal abscess   . Sleep apnea    pt has CPAP machine but does not use due to mask     Past Surgical History:  Procedure Laterality Date  . ANAL FISSURE REPAIR  08/05/2000   proctoscopy  . APPENDECTOMY  1984  . colonscopy     . INSERTION OF MESH N/A 01/29/2015   Procedure: INSERTION OF MESH;  Surgeon: Excell Seltzer, MD;  Location: [facility];  Service: General;  Laterality: N/A;  . IRRIGATION AND DEBRIDEMENT ABSCESS  02/18/2012   peri-rectal  . SHOULDER ARTHROSCOPY W/ LABRAL REPAIR  08/08/2007   left  . UMBILICAL HERNIA REPAIR  10/27/2010  . VENTRAL HERNIA REPAIR N/A 01/29/2015   Procedure: LAPAROSCOPIC VENTRAL HERNIA;  Surgeon: Excell Seltzer, MD;  Location: [facility];  Service: General;  Laterality: N/A;    Current Medications: Current Meds  Medication Sig  . apixaban (ELIQUIS) 5 MG TABS tablet Take 5 mg by mouth 2 (two) times daily.  Marland Kitchen gabapentin (NEURONTIN) 300 MG capsule Take 1 capsule (300 mg total) by mouth 2 (two) times daily. (Patient taking differently: Take 300 mg by mouth daily. )  . glimepiride (AMARYL) 2 MG tablet Take 2 mg by mouth daily with breakfast.  . guaiFENesin (MUCINEX) 600 MG 12 hr tablet Take 2 tablets (1,200 mg total) by mouth 2 (two) times daily as needed for cough or to loosen phlegm.  Marland Kitchen lithium carbonate (LITHOBID) 300 MG CR tablet Take 3 tablets by mouth at bedtime.  Marland Kitchen LORazepam (ATIVAN) 1 MG tablet Take 1 tablet by mouth daily.  Marland Kitchen losartan (COZAAR) 100 MG tablet Take 1 tablet (100 mg total) by mouth every evening.  . metoprolol tartrate (LOPRESSOR) 25 MG tablet Take 1 tablet by mouth 2 (two) times daily with a meal.  .  omeprazole (PRILOSEC) 40 MG capsule Take 1 capsule by mouth as needed.  . sertraline (ZOLOFT) 50 MG tablet Take 50 mg by mouth daily.  . traZODone (DESYREL) 100 MG tablet Take 1 tablet (100 mg total) by mouth at bedtime as needed for sleep.  . [DISCONTINUED] FLUoxetine (PROZAC) 40 MG capsule Take 1 capsule (40 mg total) by mouth daily.     Allergies:   Morphine   Social History   Socioeconomic History  . Marital status: Widowed    Spouse name: Not on file  . Number of children: Not on file  . Years of education: Not on file  . Highest education level: Not on file  Occupational History  . Not on file  Social Needs  . Financial resource strain: Not on file  . Food insecurity:    Worry: Not on file    Inability: Not on file  .  Transportation needs:    Medical: Not on file    Non-medical: Not on file  Tobacco Use  . Smoking status: Former Smoker    Packs/day: 0.50    Years: 1.00    Pack years: 0.50    Types: Cigarettes    Last attempt to quit: 07/06/1977    Years since quitting: 40.9  . Smokeless tobacco: Never Used  Substance and Sexual Activity  . 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 Drug use: No  . Sexual activity: Not on file  Lifestyle  . Physical activity:    Days per week: Not on file    Minutes per session: Not on file  . Stress: Not on file  Relationships  . Social connections:    Talks on phone: Not on file    Gets together: Not on file    Attends religious service: Not on file    Active member of club or organization: Not on file    Attends meetings of clubs or organizations: Not on file    Relationship status: Not on file  Other Topics Concern  . Not on file  Social History Narrative  . Not on file     Family History: The patient's family history includes Cancer in his mother.  ROS:   Please see the history of present illness.    All other systems reviewed and are  negative.  EKGs/Labs/Other Studies Reviewed:    The following studies were reviewed today: EKG done today reveals sinus rhythm and nonspecific ST-T changes.  We will try to obtain a copy of the EKG from his primary care physician.   Recent Labs: No results found for requested labs within last 8760 hours.  Recent Lipid Panel No results found for: CHOL, TRIG, HDL, CHOLHDL, VLDL, LDLCALC, LDLDIRECT  Physical Exam:    VS:  BP 132/68 (BP Location: Left Arm, Patient Position: Sitting, Cuff Size: Normal)   Pulse 62   Ht 6' (1.829 m)   Wt 293 lb (132.9 kg)   SpO2 98%   BMI 39.74 kg/m     Wt Readings from Last 3 Encounters:  06/06/18 293 lb (132.9 kg)  09/25/16 270 lb (122.5 kg)  12/26/15 270 lb (122.5 kg)     GEN: Patient is in no acute distress HEENT: Normal NECK: No JVD; No carotid bruits LYMPHATICS: No lymphadenopathy CARDIAC: S1 S2 regular, 2/6 systolic murmur at the apex. RESPIRATORY:  Clear to auscultation without rales, wheezing or rhonchi  ABDOMEN: Soft, non-tender, non-distended MUSCULOSKELETAL:  No edema; No deformity  SKIN: Warm and dry NEUROLOGIC:  Alert and oriented x 3 PSYCHIATRIC:  Normal affect    Signed, Jenean Lindau, MD  06/06/2018 9:31 AM    [place]

## 2018-06-07 ENCOUNTER — Telehealth: Payer: Self-pay | Admitting: *Deleted

## 2018-06-07 NOTE — Telephone Encounter (Signed)
Pt contacted pre-catheterization scheduled at [facility] for: Wednesday June 08, 2018 8:30 AM Verified arrival time and place: [facility] at: 6:30 AM  No solid food after midnight prior to cath, clear liquids until 5 AM day of procedure. Verified allergies in Epic  Hold: Glimepiride AM of procedure. Actos AM of procedure.  Insulin AM of procedure.  Losartan 06/07/18 and AM of procedure-pt had already taken today.   Apixaban--pt states last dose was 7 AM 06/07/18--I did make Dr Ellyn Hack aware that patient took his Apixaban this morning at 7 AM. Per [place]/ Dr Richardo Priest Ellyn Hack  will go ahead and cath tomorrow at 8:30 AM. Pt is aware not to take any more Apixaban until after procedure.  AM meds can be  taken pre-cath with sip of water including: ASA 81 mg  Confirmed patient has responsible person to drive home post procedure and observe patient for 24 hours: yes

## 2018-06-07 NOTE — Progress Notes (Signed)
Spoke with Dr Allison Quarry office nurse and told her we need correct/updated orders for tomorrow's first case heart cath.

## 2018-06-08 ENCOUNTER — Ambulatory Visit (HOSPITAL_COMMUNITY)
Admission: RE | Admit: 2018-06-08 | Discharge: 2018-06-08 | Disposition: A | Discharge status: Home / Self Care | Payer: Self-pay | Source: Ambulatory Visit | Attending: Cardiology | Admitting: Cardiology

## 2018-06-08 ENCOUNTER — Encounter (HOSPITAL_COMMUNITY): Payer: Self-pay | Admitting: Cardiology

## 2018-06-08 ENCOUNTER — Encounter (HOSPITAL_COMMUNITY)
Admission: RE | Disposition: A | Discharge status: Home / Self Care | Payer: Self-pay | Source: Ambulatory Visit | Attending: Cardiology

## 2018-06-08 DIAGNOSIS — Z87891 Personal history of nicotine dependence: Secondary | ICD-10-CM | POA: Insufficient documentation

## 2018-06-08 DIAGNOSIS — F419 Anxiety disorder, unspecified: Secondary | ICD-10-CM | POA: Insufficient documentation

## 2018-06-08 DIAGNOSIS — I25119 Atherosclerotic heart disease of native coronary artery with unspecified angina pectoris: Secondary | ICD-10-CM | POA: Insufficient documentation

## 2018-06-08 DIAGNOSIS — E088 Diabetes mellitus due to underlying condition with unspecified complications: Secondary | ICD-10-CM

## 2018-06-08 DIAGNOSIS — E118 Type 2 diabetes mellitus with unspecified complications: Secondary | ICD-10-CM | POA: Insufficient documentation

## 2018-06-08 DIAGNOSIS — Z7984 Long term (current) use of oral hypoglycemic drugs: Secondary | ICD-10-CM | POA: Insufficient documentation

## 2018-06-08 DIAGNOSIS — E669 Obesity, unspecified: Secondary | ICD-10-CM | POA: Insufficient documentation

## 2018-06-08 DIAGNOSIS — Z6839 Body mass index (BMI) 39.0-39.9, adult: Secondary | ICD-10-CM | POA: Insufficient documentation

## 2018-06-08 DIAGNOSIS — E663 Overweight: Secondary | ICD-10-CM

## 2018-06-08 DIAGNOSIS — Z8679 Personal history of other diseases of the circulatory system: Secondary | ICD-10-CM | POA: Diagnosis present

## 2018-06-08 DIAGNOSIS — I480 Paroxysmal atrial fibrillation: Secondary | ICD-10-CM | POA: Diagnosis present

## 2018-06-08 DIAGNOSIS — I10 Essential (primary) hypertension: Secondary | ICD-10-CM | POA: Insufficient documentation

## 2018-06-08 DIAGNOSIS — F329 Major depressive disorder, single episode, unspecified: Secondary | ICD-10-CM | POA: Insufficient documentation

## 2018-06-08 DIAGNOSIS — Z9889 Other specified postprocedural states: Secondary | ICD-10-CM | POA: Insufficient documentation

## 2018-06-08 DIAGNOSIS — E781 Pure hyperglyceridemia: Secondary | ICD-10-CM | POA: Insufficient documentation

## 2018-06-08 DIAGNOSIS — Z885 Allergy status to narcotic agent status: Secondary | ICD-10-CM | POA: Insufficient documentation

## 2018-06-08 DIAGNOSIS — Z7901 Long term (current) use of anticoagulants: Secondary | ICD-10-CM | POA: Insufficient documentation

## 2018-06-08 DIAGNOSIS — K219 Gastro-esophageal reflux disease without esophagitis: Secondary | ICD-10-CM | POA: Insufficient documentation

## 2018-06-08 DIAGNOSIS — E7800 Pure hypercholesterolemia, unspecified: Secondary | ICD-10-CM | POA: Insufficient documentation

## 2018-06-08 DIAGNOSIS — Z79899 Other long term (current) drug therapy: Secondary | ICD-10-CM | POA: Insufficient documentation

## 2018-06-08 DIAGNOSIS — I209 Angina pectoris, unspecified: Secondary | ICD-10-CM

## 2018-06-08 DIAGNOSIS — G4730 Sleep apnea, unspecified: Secondary | ICD-10-CM | POA: Insufficient documentation

## 2018-06-08 HISTORY — PX: LEFT HEART CATH AND CORONARY ANGIOGRAPHY: CATH118249

## 2018-06-08 LAB — GLUCOSE, CAPILLARY
Glucose-Capillary: 153 mg/dL — ABNORMAL HIGH (ref 65–99)
Glucose-Capillary: 133 mg/dL — ABNORMAL HIGH (ref 65–99)

## 2018-06-08 SURGERY — LEFT HEART CATH AND CORONARY ANGIOGRAPHY
Anesthesia: LOCAL

## 2018-06-08 MED ORDER — ONDANSETRON HCL 4 MG/2ML IJ SOLN
4.0000 mg | Freq: Four times a day (QID) | INTRAMUSCULAR | Status: DC | PRN
Start: 2018-06-08 — End: 2018-06-08

## 2018-06-08 MED ORDER — FENTANYL CITRATE (PF) 100 MCG/2ML IJ SOLN
INTRAMUSCULAR | Status: DC | PRN
  Administered 2018-06-08: 25 ug via INTRAVENOUS

## 2018-06-08 MED ORDER — SODIUM CHLORIDE 0.9 % WEIGHT BASED INFUSION: 1.0000 mL/kg/h | INTRAVENOUS | Status: DC

## 2018-06-08 MED ORDER — ACETAMINOPHEN 325 MG PO TABS: 650.0000 mg | ORAL_TABLET | ORAL | Status: DC | PRN

## 2018-06-08 MED ORDER — LIDOCAINE HCL (PF) 1 % IJ SOLN
INTRAMUSCULAR | Status: AC
  Filled 2018-06-08: qty 30

## 2018-06-08 MED ORDER — FENTANYL CITRATE (PF) 100 MCG/2ML IJ SOLN
INTRAMUSCULAR | Status: AC
  Filled 2018-06-08: qty 2

## 2018-06-08 MED ORDER — SODIUM CHLORIDE 0.9% FLUSH: 3.0000 mL | INTRAVENOUS | Status: DC | PRN

## 2018-06-08 MED ORDER — HEPARIN SODIUM (PORCINE) 1000 UNIT/ML IJ SOLN
INTRAMUSCULAR | Status: AC
  Filled 2018-06-08: qty 1

## 2018-06-08 MED ORDER — SODIUM CHLORIDE 0.9 % IV SOLN: 250.0000 mL | INTRAVENOUS | Status: DC | PRN

## 2018-06-08 MED ORDER — SODIUM CHLORIDE 0.9 % WEIGHT BASED INFUSION
3.0000 mL/kg/h | INTRAVENOUS | Status: DC
  Administered 2018-06-08: 3 mL/kg/h via INTRAVENOUS

## 2018-06-08 MED ORDER — IOHEXOL 350 MG/ML SOLN
INTRAVENOUS | Status: DC | PRN
  Administered 2018-06-08: 85 mL via INTRA_ARTERIAL

## 2018-06-08 MED ORDER — LIDOCAINE HCL (PF) 1 % IJ SOLN
INTRAMUSCULAR | Status: DC | PRN
  Administered 2018-06-08: 2 mL

## 2018-06-08 MED ORDER — MIDAZOLAM HCL 2 MG/2ML IJ SOLN
INTRAMUSCULAR | Status: AC
  Filled 2018-06-08: qty 2

## 2018-06-08 MED ORDER — SODIUM CHLORIDE 0.9 % IV SOLN: INTRAVENOUS | Status: DC

## 2018-06-08 MED ORDER — MIDAZOLAM HCL 2 MG/2ML IJ SOLN
INTRAMUSCULAR | Status: DC | PRN
  Administered 2018-06-08: 1 mg via INTRAVENOUS

## 2018-06-08 MED ORDER — HEPARIN (PORCINE) IN NACL 2-0.9 UNITS/ML
INTRAMUSCULAR | Status: AC | PRN
  Administered 2018-06-08 (×2): 500 mL

## 2018-06-08 MED ORDER — HEPARIN (PORCINE) IN NACL 1000-0.9 UT/500ML-% IV SOLN
INTRAVENOUS | Status: AC
  Filled 2018-06-08: qty 1000

## 2018-06-08 MED ORDER — VERAPAMIL HCL 2.5 MG/ML IV SOLN
INTRAVENOUS | Status: DC | PRN
  Administered 2018-06-08: 10 mL via INTRA_ARTERIAL

## 2018-06-08 MED ORDER — HEPARIN SODIUM (PORCINE) 1000 UNIT/ML IJ SOLN
INTRAMUSCULAR | Status: DC | PRN
  Administered 2018-06-08: 6000 [IU] via INTRAVENOUS

## 2018-06-08 MED ORDER — ASPIRIN 81 MG PO CHEW: 81.0000 mg | CHEWABLE_TABLET | ORAL | Status: DC

## 2018-06-08 MED ORDER — SODIUM CHLORIDE 0.9% FLUSH: 3.0000 mL | Freq: Two times a day (BID) | INTRAVENOUS | Status: DC

## 2018-06-08 MED ORDER — VERAPAMIL HCL 2.5 MG/ML IV SOLN
INTRAVENOUS | Status: AC
  Filled 2018-06-08: qty 2

## 2018-06-08 SURGICAL SUPPLY — 13 items
CATH INFINITI 5FR ANG PIGTAIL (CATHETERS) ×1 IMPLANT
CATH OPTITORQUE TIG 4.0 5F (CATHETERS) ×1 IMPLANT
COVER PRB 48X5XTLSCP FOLD TPE (BAG) IMPLANT
COVER PROBE 5X48 (BAG) ×2
DEVICE RAD COMP TR BAND LRG (VASCULAR PRODUCTS) ×1 IMPLANT
GLIDESHEATH SLEND A-KIT 6F 22G (SHEATH) ×1 IMPLANT
GUIDEWIRE INQWIRE 1.5J.035X260 (WIRE) IMPLANT
INQWIRE 1.5J .035X260CM (WIRE) ×2
KIT HEART LEFT (KITS) ×2 IMPLANT
PACK CARDIAC CATHETERIZATION (CUSTOM PROCEDURE TRAY) ×2 IMPLANT
SYR MEDRAD MARK V 150ML (SYRINGE) ×2 IMPLANT
TRANSDUCER W/STOPCOCK (MISCELLANEOUS) ×2 IMPLANT
TUBING CIL FLEX 10 FLL-RA (TUBING) ×2 IMPLANT

## 2018-06-08 NOTE — Discharge Instructions (Signed)
Drink plenty of fluids over next 48 hours and keep right wrist elevated at heart level for 24 hours ° °Radial Site Care °Refer to this sheet in the next few weeks. These instructions provide you with information about caring for yourself after your procedure. Your health care provider may also give you more specific instructions. Your treatment has been planned according to current medical practices, but problems sometimes occur. Call your health care provider if you have any problems or questions after your procedure. °What can I expect after the procedure? °After your procedure, it is typical to have the following: °· Bruising at the radial site that usually fades within 1-2 weeks. °· Blood collecting in the tissue (hematoma) that may be painful to the touch. It should usually decrease in size and tenderness within 1-2 weeks. ° °Follow these instructions at home: °· Take medicines only as directed by your health care provider. °· You may shower 24-48 hours after the procedure or as directed by your health care provider. Remove the bandage (dressing) and gently wash the site with plain soap and water. Pat the area dry with a clean towel. Do not rub the site, because this may cause bleeding. °· Do not take baths, swim, or use a hot tub until your health care provider approves. °· Check your insertion site every day for redness, swelling, or drainage. °· Do not apply powder or lotion to the site. °· Do not flex or bend the affected arm for 24 hours or as directed by your health care provider. °· Do not push or pull heavy objects with the affected arm for 24 hours or as directed by your health care provider. °· Do not lift over 10 lb (4.5 kg) for 5 days after your procedure or as directed by your health care provider. °· Ask your health care provider when it is okay to: °? Return to work or school. °? Resume usual physical activities or sports. °? Resume sexual activity. °· Do not drive home if you are discharged the  same day as the procedure. Have someone else drive you. °· You may drive 24 hours after the procedure unless otherwise instructed by your health care provider. °· Do not operate machinery or power tools for 24 hours after the procedure. °· If your procedure was done as an outpatient procedure, which means that you went home the same day as your procedure, a responsible adult should be with you for the first 24 hours after you arrive home. °· Keep all follow-up visits as directed by your health care provider. This is important. °Contact a health care provider if: °· You have a fever. °· You have chills. °· You have increased bleeding from the radial site. Hold pressure on the site. °Get help right away if: °· You have unusual pain at the radial site. °· You have redness, warmth, or swelling at the radial site. °· You have drainage (other than a small amount of blood on the dressing) from the radial site. °· The radial site is bleeding, and the bleeding does not stop after 30 minutes of holding steady pressure on the site. °· Your arm or hand becomes pale, cool, tingly, or numb. °This information is not intended to replace advice given to you by your health care provider. Make sure you discuss any questions you have with your health care provider. °Document Released: 01/16/2011 Document Revised: 05/21/2016 Document Reviewed: 07/02/2014 °Elsevier Interactive Patient Education © 2018 [employer] ° °

## 2018-06-08 NOTE — Interval H&P Note (Signed)
History and Physical Interval Note:  06/08/2018 8:28 AM  Gary Grimes  has presented today for surgery, with the diagnosis of angina.   The various methods of treatment have been discussed with the patient and family. After consideration of risks, benefits and other options for treatment, the patient has consented to  Procedure(s): LEFT HEART CATH AND CORONARY ANGIOGRAPHY (N/A) with possible PERCUTANEOUS CORONARY INTERVENTION as a surgical intervention .  The patient's history has been reviewed, patient examined, no change in status, stable for surgery.  I have reviewed the patient's chart and labs.  Questions were answered to the patient's satisfaction.     Cath Lab Visit (complete for each Cath Lab visit)  Clinical Evaluation Leading to the Procedure:   ACS: No.  Non-ACS:    Anginal Classification: CCS III  Anti-ischemic medical therapy: Minimal Therapy (1 class of medications)  Non-Invasive Test Results: No non-invasive testing performed  Prior CABG: No previous CABG   Glenetta Hew

## 2018-06-09 MED FILL — Heparin Sod (Porcine)-NaCl IV Soln 1000 Unit/500ML-0.9%: INTRAVENOUS | Qty: 1000 | Status: AC

## 2018-06-10 ENCOUNTER — Telehealth: Payer: Self-pay

## 2018-06-10 NOTE — Telephone Encounter (Signed)
Patient called stating he is self pay and cannot afford his eliquis. Patient assistance form has been printed for patient to come in the office and fill out/sign. Samples will be given to maintain patient in the mean time.

## 2018-06-15 ENCOUNTER — Telehealth: Payer: Self-pay | Admitting: Cardiology

## 2018-06-15 NOTE — Telephone Encounter (Signed)
Patient returned your call. Please call cell at [phone]

## 2018-06-15 NOTE — Telephone Encounter (Signed)
Returned call with no answer. Attempting to call patient to inform him his patient assistance has been faxed off to the company.

## 2018-06-16 NOTE — Telephone Encounter (Signed)
Informed by Ledell Noss that assistance forms were sent yesterday.

## 2018-06-28 ENCOUNTER — Ambulatory Visit: Payer: Self-pay

## 2018-06-28 ENCOUNTER — Ambulatory Visit (HOSPITAL_BASED_OUTPATIENT_CLINIC_OR_DEPARTMENT_OTHER)
Admission: RE | Admit: 2018-06-28 | Discharge: 2018-06-28 | Disposition: A | Discharge status: Home / Self Care | Payer: Self-pay | Source: Ambulatory Visit | Attending: Cardiology | Admitting: Cardiology

## 2018-06-28 DIAGNOSIS — I10 Essential (primary) hypertension: Secondary | ICD-10-CM | POA: Insufficient documentation

## 2018-06-28 DIAGNOSIS — I480 Paroxysmal atrial fibrillation: Secondary | ICD-10-CM | POA: Insufficient documentation

## 2018-06-28 DIAGNOSIS — G4730 Sleep apnea, unspecified: Secondary | ICD-10-CM | POA: Insufficient documentation

## 2018-06-28 DIAGNOSIS — E669 Obesity, unspecified: Secondary | ICD-10-CM | POA: Insufficient documentation

## 2018-06-28 DIAGNOSIS — E119 Type 2 diabetes mellitus without complications: Secondary | ICD-10-CM | POA: Insufficient documentation

## 2018-06-28 DIAGNOSIS — E088 Diabetes mellitus due to underlying condition with unspecified complications: Secondary | ICD-10-CM

## 2018-06-28 DIAGNOSIS — E663 Overweight: Secondary | ICD-10-CM | POA: Insufficient documentation

## 2018-06-28 DIAGNOSIS — Z6839 Body mass index (BMI) 39.0-39.9, adult: Secondary | ICD-10-CM | POA: Insufficient documentation

## 2018-06-28 DIAGNOSIS — I209 Angina pectoris, unspecified: Secondary | ICD-10-CM | POA: Insufficient documentation

## 2018-06-28 NOTE — Progress Notes (Signed)
  Echocardiogram 2D Echocardiogram has been performed.  Joelene Millin 06/28/2018, 11:33 AM

## 2018-07-01 ENCOUNTER — Encounter: Payer: Self-pay | Admitting: *Deleted

## 2018-07-01 ENCOUNTER — Telehealth: Payer: Self-pay | Admitting: *Deleted

## 2018-07-01 NOTE — Telephone Encounter (Signed)
Left message to return call regarding echocardiogram results.

## 2018-07-06 ENCOUNTER — Ambulatory Visit: Payer: Self-pay | Admitting: Cardiology

## 2018-07-25 ENCOUNTER — Ambulatory Visit: Payer: Self-pay | Admitting: Cardiology

## 2018-08-01 ENCOUNTER — Ambulatory Visit (INDEPENDENT_AMBULATORY_CARE_PROVIDER_SITE_OTHER): Payer: Self-pay | Admitting: Cardiology

## 2018-08-01 ENCOUNTER — Encounter: Payer: Self-pay | Admitting: Cardiology

## 2018-08-01 VITALS — BP 128/80 | HR 52 | Ht 72.0 in | Wt 299.0 lb

## 2018-08-01 DIAGNOSIS — I10 Essential (primary) hypertension: Secondary | ICD-10-CM

## 2018-08-01 DIAGNOSIS — I4892 Unspecified atrial flutter: Secondary | ICD-10-CM | POA: Insufficient documentation

## 2018-08-01 DIAGNOSIS — I480 Paroxysmal atrial fibrillation: Secondary | ICD-10-CM

## 2018-08-01 DIAGNOSIS — E088 Diabetes mellitus due to underlying condition with unspecified complications: Secondary | ICD-10-CM

## 2018-08-01 NOTE — Progress Notes (Signed)
Cardiology Office Note:    Date:  08/01/2018   ID:  ADIS STURGILL, DOB [birth year], MRN [id number]  PCP:  Shirline Frees, MD  Cardiologist:  Jenean Lindau, MD   Referring MD: Shirline Frees, MD    ASSESSMENT:    1. Paroxysmal atrial fibrillation (HCC)   2. Essential hypertension   3. 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 Paroxysmal atrial flutter (HCC)    PLAN:    In order of problems listed above:  1. Primary prevention stressed with the patient.  Importance of compliance with diet and medications stressed and he vocalized understanding.  Diet was discussed for dyslipidemia, diabetes mellitus and obesity.  Risks of obesity explained and he vocalized understanding. 2. His blood pressure is stable. 3. He experiences palpitations and he does have paroxysmal atrial flutter on the tracings.  In view of this I would like to send him to our electrophysiology colleagues for an evaluation.  There are multiple questions in his management which include the fact that if his flutter is ablated then his palpitations may resolve significantly.  Also there are questions about long-term anticoagulation in this young gentleman in such.  This is best handled by somebody with specialized training in this area and the patient is agreeable on this. 4. Patient will be seen in follow-up appointment in 6 months or earlier if the patient has any concerns    Medication Adjustments/Labs and Tests Ordered: Current medicines are reviewed at length with the patient today.  Concerns regarding medicines are outlined above.  No orders of the defined types were placed in this encounter.  No orders of the defined types were placed in this encounter.    Chief Complaint  Patient presents with  . Follow-up     History of Present Illness:    CHILD CAMPOY is a 57 y.o. male.  The patient was evaluated for symptoms of chest  tightness and has had unremarkable coronary angiography and echocardiogram.  He is very happy about it.  He denies any chest pain orthopnea or PND.  His monitor reveals suggestions of atrial flutter which is paroxysmal.  Patient does experience palpitations at times.  Past Medical History:  Diagnosis Date  . Anginal pain ([place])    ER visit 01/14/2014 visit on chart   . Anxiety    pt denies  . Arthritis   . Depression    did get seen in er 4/13 for evaluation-psyc  . Diabetes mellitus without complication ([place])   . GERD (gastroesophageal reflux disease)   . High cholesterol   . High triglycerides   . Hypertension   . Levator syndrome 2001   history   . Panic attacks   . Perirectal abscess   . Sleep apnea    pt has CPAP machine but does not use due to mask     Past Surgical History:  Procedure Laterality Date  . ANAL FISSURE REPAIR  08/05/2000   proctoscopy  . APPENDECTOMY  1984  . colonscopy     . INSERTION OF MESH N/A 01/29/2015   Procedure: INSERTION OF MESH;  Surgeon: Excell Seltzer, MD;  Location: [facility];  Service: General;  Laterality: N/A;  . IRRIGATION AND DEBRIDEMENT ABSCESS  02/18/2012   peri-rectal  . LEFT HEART CATH AND CORONARY ANGIOGRAPHY N/A 06/08/2018   Procedure: LEFT HEART CATH AND CORONARY ANGIOGRAPHY;  Surgeon: Leonie Man, MD;  Location: [facility];  Service: Cardiovascular;  Laterality: N/A;  . SHOULDER ARTHROSCOPY W/ LABRAL REPAIR  08/08/2007   left  . UMBILICAL HERNIA REPAIR  10/27/2010  . VENTRAL HERNIA REPAIR N/A 01/29/2015   Procedure: LAPAROSCOPIC VENTRAL HERNIA;  Surgeon: Excell Seltzer, MD;  Location: [facility];  Service: General;  Laterality: N/A;    Current Medications: Current Meds  Medication Sig  . apixaban (ELIQUIS) 5 MG TABS tablet Take 5 mg by mouth 2 (two) times daily.  Marland Kitchen gabapentin (NEURONTIN) 300 MG capsule Take 1 capsule (300 mg total) by mouth 2 (two) times daily. (Patient taking differently: Take 300 mg by mouth daily. )    . glimepiride (AMARYL) 2 MG tablet Take 2 mg by mouth daily with breakfast.  . insulin lispro (HUMALOG) 100 UNIT/ML injection Inject 4 Units into the skin 2 (two) times daily as needed for high blood sugar.   . lithium carbonate (LITHOBID) 300 MG CR tablet Take 3 tablets by mouth at bedtime.  Marland Kitchen LORazepam (ATIVAN) 1 MG tablet Take 1 tablet by mouth at bedtime.   Marland Kitchen losartan (COZAAR) 100 MG tablet Take 1 tablet (100 mg total) by mouth every evening.  . metoprolol tartrate (LOPRESSOR) 25 MG tablet Take 1 tablet by mouth 2 (two) times daily with a meal.  . omeprazole (PRILOSEC) 40 MG capsule Take 1 capsule by mouth daily as needed (HEARTBURN).   . pioglitazone (ACTOS) 30 MG tablet Take 30 mg by mouth every morning.   . risperiDONE (RISPERDAL) 2 MG tablet Take 2 mg by mouth at bedtime.  . sertraline (ZOLOFT) 50 MG tablet Take 50 mg by mouth daily.  . traZODone (DESYREL) 100 MG tablet Take 1 tablet (100 mg total) by mouth at bedtime as needed for sleep. (Patient taking differently: Take 100 mg by mouth at bedtime. )     Allergies:   Morphine   Social History   Socioeconomic History  . Marital status: Widowed    Spouse name: Not on file  . Number of children: Not on file  . Years of education: Not on file  . Highest education level: Not on file  Occupational History  . Not on file  Social Needs  . Financial resource strain: Not on file  . Food insecurity:    Worry: Not on file    Inability: Not on file  . Transportation needs:    Medical: Not on file    Non-medical: Not on file  Tobacco Use  . Smoking status: Former Smoker    Packs/day: 0.50    Years: 1.00    Pack years: 0.50    Types: Cigarettes    Last attempt to quit: 07/06/1977    Years since quitting: 41.0  . Smokeless tobacco: Never Used  Substance and Sexual Activity  . 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 Drug use: No  . Sexual activity: Not on file  Lifestyle  .  Physical activity:    Days per week: Not on file    Minutes per session: Not on file  . Stress: Not on file  Relationships  . Social connections:    Talks on phone: Not on file    Gets together: Not on file    Attends religious service: Not on file    Active member of club or organization: Not on file    Attends meetings of clubs or organizations: Not on file    Relationship status: Not on file  Other Topics  Concern  . Not on file  Social History Narrative  . Not on file     Family History: The patient's family history includes Cancer in his mother.  ROS:   Please see the history of present illness.    All other systems reviewed and are negative.  EKGs/Labs/Other Studies Reviewed:    The following studies were reviewed today: I discussed the findings of the cardiac cath, echocardiogram and monitor findings with him at length.   Recent Labs: No results found for requested labs within last 8760 hours.  Recent Lipid Panel No results found for: CHOL, TRIG, HDL, CHOLHDL, VLDL, LDLCALC, LDLDIRECT  Physical Exam:    VS:  BP 128/80   Pulse (!) 52   Ht 6' (1.829 m)   Wt 299 lb (135.6 kg)   SpO2 95%   BMI 40.55 kg/m     Wt Readings from Last 3 Encounters:  08/01/18 299 lb (135.6 kg)  06/08/18 293 lb (132.9 kg)  06/06/18 293 lb (132.9 kg)     GEN: Patient is in no acute distress HEENT: Normal NECK: No JVD; No carotid bruits LYMPHATICS: No lymphadenopathy CARDIAC: Hear sounds regular, 2/6 systolic murmur at the apex. RESPIRATORY:  Clear to auscultation without rales, wheezing or rhonchi  ABDOMEN: Soft, non-tender, non-distended MUSCULOSKELETAL:  No edema; No deformity  SKIN: Warm and dry NEUROLOGIC:  Alert and oriented x 3 PSYCHIATRIC:  Normal affect   Signed, Jenean Lindau, MD  08/01/2018 4:18 PM    [facility]

## 2018-08-01 NOTE — Patient Instructions (Signed)
Medication Instructions:  Your physician recommends that you continue on your current medications as directed. Please refer to the Current Medication list given to you today.  Labwork: None ordered  Testing/Procedures: None ordered  Follow-Up: Your physician recommends that you schedule a follow-up appointment in: 6 months with Dr. [name]   Any Other Special Instructions Will Be Listed Below (If Applicable).  You have been referred to see EP. You will be contacted to schedule an appointment with Dr. Curt Bears.   If you need a refill on your cardiac medications before your next appointment, please call your pharmacy.

## 2018-08-02 ENCOUNTER — Encounter: Payer: Self-pay | Admitting: *Deleted

## 2018-08-02 NOTE — Addendum Note (Signed)
Addended by: Austin Miles on: 08/02/2018 08:28 AM   Modules accepted: Orders

## 2018-10-10 ENCOUNTER — Encounter: Payer: Self-pay | Admitting: Cardiology

## 2018-10-10 ENCOUNTER — Ambulatory Visit (INDEPENDENT_AMBULATORY_CARE_PROVIDER_SITE_OTHER): Payer: Self-pay | Admitting: Cardiology

## 2018-10-10 VITALS — BP 124/70 | HR 55 | Ht 72.0 in | Wt 296.0 lb

## 2018-10-10 DIAGNOSIS — I480 Paroxysmal atrial fibrillation: Secondary | ICD-10-CM

## 2018-10-10 DIAGNOSIS — Z01812 Encounter for preprocedural laboratory examination: Secondary | ICD-10-CM

## 2018-10-10 DIAGNOSIS — I10 Essential (primary) hypertension: Secondary | ICD-10-CM

## 2018-10-10 DIAGNOSIS — G4733 Obstructive sleep apnea (adult) (pediatric): Secondary | ICD-10-CM

## 2018-10-10 NOTE — Patient Instructions (Addendum)
Medication Instructions:  Your physician recommends that you continue on your current medications as directed. Please refer to the Current Medication list given to you today.  If you need a refill on your cardiac medications before your next appointment, please call your pharmacy.   Lab work: Pre procedure labs today: BMP & CBC If you have labs (blood work) drawn today and your tests are completely normal, you will receive your results only by: Marland Kitchen MyChart Message (if you have MyChart) OR . A paper copy in the mail If you have any lab test that is abnormal or we need to change your treatment, we will call you to review the results.  Testing/Procedures: Your physician has requested that you have cardiac CT within 7 days prior to your ablation. Cardiac computed tomography (CT) is a painless test that uses an x-ray machine to take clear, detailed pictures of your heart. For further information please visit [employer]. Please follow instruction located under special instructions area below.  You are scheduled for   Your physician has recommended that you have an ablation. Catheter ablation is a medical procedure used to treat some cardiac arrhythmias (irregular heartbeats). During catheter ablation, a long, thin, flexible tube is put into a blood vessel in your groin (upper thigh), or neck. This tube is called an ablation catheter. It is then guided to your heart through the blood vessel. Radio frequency waves destroy small areas of heart tissue where abnormal heartbeats may cause an arrhythmia to start.   Instructions for your ablation: 1. Please arrive at the [facility], Main Entrance "A", of [facility] at 9:30 a.m. on 10/28/2018. 2. Do not eat or drink after midnight the night prior to the procedure. 3. Do not miss any doses of ELIQUIS prior to the morning of the procedure.  4. Do not take any medications the morning of the procedure. 5. Both of your groins will need to be  shaved for this procedure (if needed). We ask that you do this yourself at home 1-2 days prior to the procedure.  If you are unable/uncomfortable to do yourself, the hospital staff will shave you the day of your procedure (if needed). 6. Plan for an overnight stay in the hospital. 7. You will need someone to drive you home at discharge.   Follow-Up: Your physician recommends that you schedule a follow-up appointment in: 4 weeks, after your procedure on 10/28/2018, with Roderic Palau in the AFib clinic.  You are scheduled to follow up with her on 11/28/2018 @ 1:30 pm  At [facility], you and your health needs are our priority.  As part of our continuing mission to provide you with exceptional heart care, we have created designated Provider Care Teams.  These Care Teams include your primary Cardiologist (physician) and Advanced Practice Providers (APPs -  Physician Assistants and Nurse Practitioners) who all work together to provide you with the care you need, when you need it. . You will need a follow up appointment in 3 months, after your procedure on 10/28/18,  with Dr. Curt Bears.  Thank you for choosing [facility]!!   Trinidad Curet, RN [phone]  Any Other Special Instructions Will Be Listed Below (If Applicable).  CT INSTRUCTIONS Please arrive at the [facility] main entrance of [facility] at 1:00 PM (30-45 minutes prior to test start time)  [facility] [place], [place] [address] [phone]  Proceed to the [facility] Radiology Department (First Floor).  Please  follow these instructions carefully (unless otherwise directed):  Hold all erectile dysfunction medications at least 48 hours prior to test.  On the Night Before the Test: . Be sure to Drink plenty of water. . Do not consume any caffeinated/decaffeinated beverages or chocolate 12 hours prior to your test. . Do not take any antihistamines 12 hours prior to your test. . If  you take Metformin do not take 24 hours prior to test.  On the Day of the Test: . Drink plenty of water. Do not drink any water within one hour of the test. . Do not eat any food 4 hours prior to the test. . You may take your regular medications prior to the test.  . Take metoprolol (Lopressor) two hours prior to test. o If your heart rate is less than 55bpm, then no Lopressor o If you heart rate is greater than 55 bpm, then take Lopressor 100 mg . HOLD Furosemide/Hydrochorothiazide morning of the test.       After the Test: . Drink plenty of water. . After receiving IV contrast, you may experience a mild flushed feeling. This is normal. . On occasion, you may experience a mild rash up to 24 hours after the test. This is not dangerous. If this occurs, you can take Benadryl 25 mg and increase your fluid intake. . If you experience trouble breathing, this can be serious. If it is severe call 911 IMMEDIATELY. If it is mild, please call our office. . If you take any of these medications: Glipizide/Metformin, Avandament, Glucavance, please do not take 48 hours after completing test.     Cardiac Ablation Cardiac ablation is a procedure to disable (ablate) a small amount of heart tissue in very specific places. The heart has many electrical connections. Sometimes these connections are abnormal and can cause the heart to beat very fast or irregularly. Ablating some of the problem areas can improve the heart rhythm or return it to normal. Ablation may be done for people who:  Have Wolff-Parkinson-White syndrome.  Have fast heart rhythms (tachycardia).  Have taken medicines for an abnormal heart rhythm (arrhythmia) that were not effective or caused side effects.  Have a high-risk heartbeat that may be life-threatening.  During the procedure, a small incision is made in the neck or the groin, and a long, thin, flexible tube (catheter) is inserted into the incision and moved to the heart. Small  devices (electrodes) on the tip of the catheter will send out electrical currents. A type of X-ray (fluoroscopy) will be used to help guide the catheter and to provide images of the heart. Tell a health care provider about:  Any allergies you have.  All medicines you are taking, including vitamins, herbs, eye drops, creams, and over-the-counter medicines.  Any problems you or family members have had with anesthetic medicines.  Any blood disorders you have.  Any surgeries you have had.  Any medical conditions you have, such as kidney failure.  Whether you are pregnant or may be pregnant. What are the risks? Generally, this is a safe procedure. However, problems may occur, including:  Infection.  Bruising and bleeding at the catheter insertion site.  Bleeding into the chest, especially into the sac that surrounds the heart. This is a serious complication.  Stroke or blood clots.  Damage to other structures or organs.  Allergic reaction to medicines or dyes.  Need for a permanent pacemaker if the normal electrical system is damaged. A pacemaker is a small computer that sends  electrical signals to the heart and helps your heart beat normally.  The procedure not being fully effective. This may not be recognized until months later. Repeat ablation procedures are sometimes required.  What happens before the procedure?  Follow instructions from your health care provider about eating or drinking restrictions.  Ask your health care provider about: ? Changing or stopping your regular medicines. This is especially important if you are taking diabetes medicines or blood thinners. ? Taking medicines such as aspirin and ibuprofen. These medicines can thin your blood. Do not take these medicines before your procedure if your health care provider instructs you not to.  Plan to have someone take you home from the hospital or clinic.  If you will be going home right after the procedure,  plan to have someone with you for 24 hours. What happens during the procedure?  To lower your risk of infection: ? Your health care team will wash or sanitize their hands. ? Your skin will be washed with soap. ? Hair may be removed from the incision area.  An IV tube will be inserted into one of your veins.  You will be given a medicine to help you relax (sedative).  The skin on your neck or groin will be numbed.  An incision will be made in your neck or your groin.  A needle will be inserted through the incision and into a large vein in your neck or groin.  A catheter will be inserted into the needle and moved to your heart.  Dye may be injected through the catheter to help your surgeon see the area of the heart that needs treatment.  Electrical currents will be sent from the catheter to ablate heart tissue in desired areas. There are three types of energy that may be used to ablate heart tissue: ? Heat (radiofrequency energy). ? Laser energy. ? Extreme cold (cryoablation).  When the necessary tissue has been ablated, the catheter will be removed.  Pressure will be held on the catheter insertion area to prevent excessive bleeding.  A bandage (dressing) will be placed over the catheter insertion area. The procedure may vary among health care providers and hospitals. What happens after the procedure?  Your blood pressure, heart rate, breathing rate, and blood oxygen level will be monitored until the medicines you were given have worn off.  Your catheter insertion area will be monitored for bleeding. You will need to lie still for a few hours to ensure that you do not bleed from the catheter insertion area.  Do not drive for 24 hours or as long as directed by your health care provider. Summary  Cardiac ablation is a procedure to disable (ablate) a small amount of heart tissue in very specific places. Ablating some of the problem areas can improve the heart rhythm or return it  to normal.  During the procedure, electrical currents will be sent from the catheter to ablate heart tissue in desired areas. This information is not intended to replace advice given to you by your health care provider. Make sure you discuss any questions you have with your health care provider. Document Released: 05/02/2009 Document Revised: 11/02/2016 Document Reviewed: 11/02/2016 Elsevier Interactive Patient Education  [employer].

## 2018-10-10 NOTE — Progress Notes (Signed)
Electrophysiology Office Note   Date:  10/10/2018   ID:  Gary Grimes, DOB [birth year], MRN [id number]  PCP:  Shirline Frees, MD  Cardiologist:  Revankar Primary Electrophysiologist:  Tinamarie Przybylski Meredith Leeds, MD    No chief complaint on file.    History of Present Illness: Gary Grimes is a 57 y.o. male who is being seen today for the evaluation of atrial fibrillation/flutter at the request of Sunny Schlein Revankar. Presenting today for electrophysiology evaluation.  He has a history of hypertension, hyperlipidemia, OSA on CPAP, atrial fibrillation.  He has had a cath that showed no major abnormalities.  He has had a paroxysmal atrial fibrillation.  His atrial fibrillation up to 6 times per week.  His episodes last up to 20 minutes at a time.  He has weakness, fatigue, shortness of breath, and palpitations.  When he is not in atrial fibrillation he feels well without complaint.  There are no exacerbating or alleviating factors.  Today, he denies symptoms of palpitations, chest pain, shortness of breath, orthopnea, PND, lower extremity edema, claudication, dizziness, presyncope, syncope, bleeding, or neurologic sequela. The patient is tolerating medications without difficulties.    Past Medical History:  Diagnosis Date  . Anginal pain ([place])    ER visit 01/14/2014 visit on chart   . Anxiety    pt denies  . Arthritis   . Depression    did get seen in er 4/13 for evaluation-psyc  . Diabetes mellitus without complication ([place])   . GERD (gastroesophageal reflux disease)   . High cholesterol   . High triglycerides   . Hypertension   . Levator syndrome 2001   history   . Panic attacks   . Perirectal abscess   . Sleep apnea    pt has CPAP machine but does not use due to mask    Past Surgical History:  Procedure Laterality Date  . ANAL FISSURE REPAIR  08/05/2000   proctoscopy  . APPENDECTOMY  1984  . colonscopy     . INSERTION OF MESH N/A 01/29/2015   Procedure: INSERTION OF MESH;   Surgeon: Excell Seltzer, MD;  Location: [facility];  Service: General;  Laterality: N/A;  . IRRIGATION AND DEBRIDEMENT ABSCESS  02/18/2012   peri-rectal  . LEFT HEART CATH AND CORONARY ANGIOGRAPHY N/A 06/08/2018   Procedure: LEFT HEART CATH AND CORONARY ANGIOGRAPHY;  Surgeon: Leonie Man, MD;  Location: [facility];  Service: Cardiovascular;  Laterality: N/A;  . SHOULDER ARTHROSCOPY W/ LABRAL REPAIR  08/08/2007   left  . UMBILICAL HERNIA REPAIR  10/27/2010  . VENTRAL HERNIA REPAIR N/A 01/29/2015   Procedure: LAPAROSCOPIC VENTRAL HERNIA;  Surgeon: Excell Seltzer, MD;  Location: [facility];  Service: General;  Laterality: N/A;     Current Outpatient Medications  Medication Sig Dispense Refill  . apixaban (ELIQUIS) 5 MG TABS tablet Take 5 mg by mouth 2 (two) times daily.    Marland Kitchen glimepiride (AMARYL) 2 MG tablet Take 2 mg by mouth daily with breakfast.    . insulin lispro (HUMALOG) 100 UNIT/ML injection Inject 4 Units into the skin 2 (two) times daily as needed for high blood sugar.     . lithium carbonate (LITHOBID) 300 MG CR tablet Take 3 tablets by mouth at bedtime.  2  . LORazepam (ATIVAN) 1 MG tablet Take 1 tablet by mouth at bedtime.   5  . losartan (COZAAR) 100 MG tablet Take 1 tablet (100 mg total) by mouth every evening.    Marland Kitchen  metoprolol tartrate (LOPRESSOR) 25 MG tablet Take 1 tablet by mouth 2 (two) times daily with a meal.  2  . omeprazole (PRILOSEC) 40 MG capsule Take 1 capsule by mouth daily as needed (HEARTBURN).     . pioglitazone (ACTOS) 30 MG tablet Take 30 mg by mouth every morning.     . risperiDONE (RISPERDAL) 2 MG tablet Take 2 mg by mouth at bedtime.    . sertraline (ZOLOFT) 50 MG tablet Take 50 mg by mouth daily.  2  . traZODone (DESYREL) 100 MG tablet Take 1 tablet (100 mg total) by mouth at bedtime as needed for sleep. 30 tablet 0  . nitroGLYCERIN (NITROSTAT) 0.4 MG SL tablet Place 1 tablet (0.4 mg total) under the tongue every 5 (five) minutes as needed for up to 25  days for chest pain. (Patient not taking: Reported on 10/10/2018) 25 tablet 11   No current facility-administered medications for this visit.     Allergies:   Morphine   Social History:  The patient  reports that he quit smoking about 41 years ago. His smoking use included cigarettes. He has a 0.50 pack-year smoking history. He has never used smokeless tobacco. He reports that he does not drink alcohol or use drugs.   Family History:  The patient's family history includes Cancer in his mother.    ROS:  Please see the history of present illness.   Otherwise, review of systems is positive for none.   All other systems are reviewed and negative.    PHYSICAL EXAM: VS:  BP 124/70   Pulse (!) 55   Ht 6' (1.829 m)   Wt 296 lb (134.3 kg)   BMI 40.14 kg/m  , BMI Body mass index is 40.14 kg/m. GEN: Well nourished, well developed, in no acute distress  HEENT: normal  Neck: no JVD, carotid bruits, or masses Cardiac: RRR; no murmurs, rubs, or gallops,no edema  Respiratory:  clear to auscultation bilaterally, normal work of breathing GI: soft, nontender, nondistended, + BS MS: no deformity or atrophy  Skin: warm and dry Neuro:  Strength and sensation are intact Psych: euthymic mood, full affect  EKG:  EKG is ordered today. Personal review of the ekg ordered shows anus rhythm, rate 55  Recent Labs: No results found for requested labs within last 8760 hours.    Lipid Panel  No results found for: CHOL, TRIG, HDL, CHOLHDL, VLDL, LDLCALC, LDLDIRECT   Wt Readings from Last 3 Encounters:  10/10/18 296 lb (134.3 kg)  08/01/18 299 lb (135.6 kg)  06/08/18 293 lb (132.9 kg)      Other studies Reviewed: Additional studies/ records that were reviewed today include: TTE 06/28/18  Review of the above records today demonstrates:  - Left ventricle: The cavity size was normal. Wall thickness was   normal. Systolic function was normal. The estimated ejection   fraction was in the range of 60%  to 65%. Wall motion was normal;   there were no regional wall motion abnormalities. - Left atrium: The atrium was mildly dilated.  LHC 06/08/18  Minimal - non-obstructive CAD  LV end diastolic pressure is moderately elevated.  LVEF ~ 55-65% - Normal with no Regional Wall Motion Abnormalities  ETT 08/01/18 -personally reviewed Baseline rhythm: Sinus with heart rate at 50  Minimum heart rate: 70 BPM.  Maximal heart rate 115 BPM.  This was atrial flutter  Atrial arrhythmia: Patient had multiple episodes of atrial flutter with fairly well-controlled ventricular rate.  There was one  episode which appeared to be atrial fibrillation with well-controlled ventricular rate.   ASSESSMENT AND PLAN:  1.  Paroxysmal atrial fibrillation: Currently on Eliquis.  He is symptomatic with weakness, fatigue, shortness of breath, and palpitations.  I talked to him about the possibility of medical management versus ablation.  At this point, since he is young, he would like to avoid medical management.  We Taleisha Kaczynski thus plan for ablation.  Risks and benefits were discussed and include bleeding, tamponade, heart block, stroke, damage to surrounding organs.  He understands these risks and has agreed to the procedure.  This patients CHA2DS2-VASc Score and unadjusted Ischemic Stroke Rate (% per year) is equal to 2.2 % stroke rate/year from a score of 2  Above score calculated as 1 point each if present [CHF, HTN, DM, Vascular=MI/PAD/Aortic Plaque, Age if 65-74, or Male] Above score calculated as 2 points each if present [Age > 75, or Stroke/TIA/TE]  2.  Hypertension: Well-controlled today.  No changes.  3.  Obstructive sleep apnea: Encouraged CPAP compliance  4.  Morbid obesity: Diet and weight loss encouraged.  Discussed with him activity levels of 150 minutes of moderate activity per week.  Current medicines are reviewed at length with the patient today.   The patient does not have concerns regarding his  medicines.  The following changes were made today:  none  Labs/ tests ordered today include:  Orders Placed This Encounter  Procedures  . CT CARDIAC MORPH/PULM VEIN W/CM&W/O CA SCORE  . CT CORONARY FRACTIONAL FLOW RESERVE DATA PREP  . CT CORONARY FRACTIONAL FLOW RESERVE FLUID ANALYSIS  . Basic metabolic panel  . CBC  . EKG 12-Lead   Case discussed with primary cardiology  Disposition:   FU with Esperanza Madrazo 3 months  Signed, Anaika Santillano Meredith Leeds, MD  10/10/2018 3:05 PM     [facility] [address] [place] [address] [phone] (office) [phone] (fax)

## 2018-10-11 LAB — CBC
Hematocrit: 42.2 % (ref 37.5–51.0)
Hemoglobin: 14.3 g/dL (ref 13.0–17.7)
MCH: 30.6 pg (ref 26.6–33.0)
MCHC: 33.9 g/dL (ref 31.5–35.7)
MCV: 90 fL (ref 79–97)
PLATELETS: 296 10*3/uL (ref 150–450)
RBC: 4.68 x10E6/uL (ref 4.14–5.80)
RDW: 14.1 % (ref 12.3–15.4)
WBC: 8.8 10*3/uL (ref 3.4–10.8)

## 2018-10-12 ENCOUNTER — Telehealth: Payer: Self-pay | Admitting: *Deleted

## 2018-10-12 NOTE — Telephone Encounter (Signed)
Advised pt to arrive at 8:30 am for his ablation on 11/1. Pt agreeable to plan

## 2018-10-25 ENCOUNTER — Ambulatory Visit (HOSPITAL_COMMUNITY): Payer: Self-pay

## 2018-10-25 ENCOUNTER — Ambulatory Visit (HOSPITAL_COMMUNITY)
Admission: RE | Admit: 2018-10-25 | Discharge: 2018-10-25 | Disposition: A | Discharge status: Home / Self Care | Payer: Self-pay | Source: Ambulatory Visit | Attending: Cardiology | Admitting: Cardiology

## 2018-10-25 DIAGNOSIS — I480 Paroxysmal atrial fibrillation: Secondary | ICD-10-CM | POA: Insufficient documentation

## 2018-10-25 LAB — POCT I-STAT CREATININE: CREATININE: 1.2 mg/dL (ref 0.61–1.24)

## 2018-10-25 MED ORDER — IOPAMIDOL (ISOVUE-370) INJECTION 76%
100.0000 mL | Freq: Once | INTRAVENOUS | Status: AC | PRN
  Administered 2018-10-25: 100 mL via INTRAVENOUS

## 2018-10-27 NOTE — Anesthesia Preprocedure Evaluation (Addendum)
Anesthesia Evaluation  Patient identified by MRN, date of birth, ID band Patient awake    Reviewed: Allergy & Precautions, NPO status , Patient's Chart, lab work & pertinent test results  History of Anesthesia Complications Negative for: history of anesthetic complications  Airway Mallampati: III  TM Distance: >3 FB Neck ROM: Full    Dental no notable dental hx. (+) Teeth Intact, Dental Advisory Given   Pulmonary sleep apnea and Continuous Positive Airway Pressure Ventilation , former smoker,    Pulmonary exam normal breath sounds clear to auscultation       Cardiovascular hypertension, Pt. on medications Normal cardiovascular exam+ dysrhythmias Atrial Fibrillation  Rhythm:Regular Rate:Normal  TTE 06/2018: EF 60-65%, mild left atrial enlargement.   Neuro/Psych Anxiety Depression Bipolar Disorder negative neurological ROS     GI/Hepatic Neg liver ROS, GERD  Medicated,  Endo/Other  diabetes, Type 2, Oral Hypoglycemic AgentsMorbid obesity  Renal/GU negative Renal ROS     Musculoskeletal  (+) Arthritis , Osteoarthritis,    Abdominal   Peds  Hematology   Anesthesia Other Findings   Reproductive/Obstetrics                           Anesthesia Physical Anesthesia Plan  ASA: III  Anesthesia Plan: General   Post-op Pain Management:    Induction: Intravenous  PONV Risk Score and Plan: 2 and Ondansetron, Treatment may vary due to age or medical condition and Midazolam  Airway Management Planned: Oral ETT  Additional Equipment:   Intra-op Plan:   Post-operative Plan: Extubation in OR  Informed Consent: I have reviewed the patients History and Physical, chart, labs and discussed the procedure including the risks, benefits and alternatives for the proposed anesthesia with the patient or authorized representative who has indicated his/her understanding and acceptance.   Dental advisory  given  Plan Discussed with: CRNA and Surgeon  Anesthesia Plan Comments:        Anesthesia Quick Evaluation

## 2018-10-28 ENCOUNTER — Encounter (HOSPITAL_COMMUNITY)
Admission: RE | Disposition: A | Discharge status: Home / Self Care | Payer: Self-pay | Source: Ambulatory Visit | Attending: Cardiology

## 2018-10-28 ENCOUNTER — Ambulatory Visit (HOSPITAL_COMMUNITY): Payer: Self-pay | Admitting: Anesthesiology

## 2018-10-28 ENCOUNTER — Encounter (HOSPITAL_COMMUNITY): Payer: Self-pay | Admitting: Anesthesiology

## 2018-10-28 ENCOUNTER — Other Ambulatory Visit: Payer: Self-pay

## 2018-10-28 ENCOUNTER — Ambulatory Visit (HOSPITAL_COMMUNITY)
Admission: RE | Admit: 2018-10-28 | Discharge: 2018-10-29 | Disposition: A | Discharge status: Home / Self Care | Payer: Self-pay | Source: Ambulatory Visit | Attending: Cardiology | Admitting: Cardiology

## 2018-10-28 DIAGNOSIS — M1990 Unspecified osteoarthritis, unspecified site: Secondary | ICD-10-CM | POA: Insufficient documentation

## 2018-10-28 DIAGNOSIS — Z8679 Personal history of other diseases of the circulatory system: Secondary | ICD-10-CM | POA: Diagnosis present

## 2018-10-28 DIAGNOSIS — Z794 Long term (current) use of insulin: Secondary | ICD-10-CM | POA: Insufficient documentation

## 2018-10-28 DIAGNOSIS — I4892 Unspecified atrial flutter: Secondary | ICD-10-CM | POA: Diagnosis present

## 2018-10-28 DIAGNOSIS — F332 Major depressive disorder, recurrent severe without psychotic features: Secondary | ICD-10-CM | POA: Diagnosis present

## 2018-10-28 DIAGNOSIS — F329 Major depressive disorder, single episode, unspecified: Secondary | ICD-10-CM | POA: Insufficient documentation

## 2018-10-28 DIAGNOSIS — I10 Essential (primary) hypertension: Secondary | ICD-10-CM | POA: Diagnosis present

## 2018-10-28 DIAGNOSIS — Z885 Allergy status to narcotic agent status: Secondary | ICD-10-CM | POA: Insufficient documentation

## 2018-10-28 DIAGNOSIS — G4730 Sleep apnea, unspecified: Secondary | ICD-10-CM | POA: Diagnosis present

## 2018-10-28 DIAGNOSIS — I480 Paroxysmal atrial fibrillation: Secondary | ICD-10-CM

## 2018-10-28 DIAGNOSIS — Z23 Encounter for immunization: Secondary | ICD-10-CM | POA: Insufficient documentation

## 2018-10-28 DIAGNOSIS — Z9889 Other specified postprocedural states: Secondary | ICD-10-CM | POA: Insufficient documentation

## 2018-10-28 DIAGNOSIS — Z87891 Personal history of nicotine dependence: Secondary | ICD-10-CM | POA: Insufficient documentation

## 2018-10-28 DIAGNOSIS — G4733 Obstructive sleep apnea (adult) (pediatric): Secondary | ICD-10-CM | POA: Insufficient documentation

## 2018-10-28 DIAGNOSIS — E119 Type 2 diabetes mellitus without complications: Secondary | ICD-10-CM | POA: Insufficient documentation

## 2018-10-28 DIAGNOSIS — F3110 Bipolar disorder, current episode manic without psychotic features, unspecified: Secondary | ICD-10-CM | POA: Diagnosis present

## 2018-10-28 DIAGNOSIS — K219 Gastro-esophageal reflux disease without esophagitis: Secondary | ICD-10-CM | POA: Insufficient documentation

## 2018-10-28 DIAGNOSIS — Z7901 Long term (current) use of anticoagulants: Secondary | ICD-10-CM | POA: Insufficient documentation

## 2018-10-28 DIAGNOSIS — E1140 Type 2 diabetes mellitus with diabetic neuropathy, unspecified: Secondary | ICD-10-CM | POA: Diagnosis present

## 2018-10-28 DIAGNOSIS — Z79899 Other long term (current) drug therapy: Secondary | ICD-10-CM | POA: Insufficient documentation

## 2018-10-28 DIAGNOSIS — F419 Anxiety disorder, unspecified: Secondary | ICD-10-CM | POA: Insufficient documentation

## 2018-10-28 DIAGNOSIS — Z6839 Body mass index (BMI) 39.0-39.9, adult: Secondary | ICD-10-CM | POA: Insufficient documentation

## 2018-10-28 DIAGNOSIS — Z955 Presence of coronary angioplasty implant and graft: Secondary | ICD-10-CM | POA: Insufficient documentation

## 2018-10-28 DIAGNOSIS — E088 Diabetes mellitus due to underlying condition with unspecified complications: Secondary | ICD-10-CM | POA: Diagnosis present

## 2018-10-28 DIAGNOSIS — E785 Hyperlipidemia, unspecified: Secondary | ICD-10-CM | POA: Insufficient documentation

## 2018-10-28 HISTORY — DX: Type 2 diabetes mellitus without complications: E11.9

## 2018-10-28 HISTORY — DX: Headache: R51

## 2018-10-28 HISTORY — DX: Bipolar disorder, unspecified: F31.9

## 2018-10-28 HISTORY — DX: Headache, unspecified: R51.9

## 2018-10-28 HISTORY — DX: Dependence on other enabling machines and devices: Z99.89

## 2018-10-28 HISTORY — DX: Low back pain: M54.5

## 2018-10-28 HISTORY — DX: Obstructive sleep apnea (adult) (pediatric): G47.33

## 2018-10-28 HISTORY — DX: Other specified postprocedural states: Z98.890

## 2018-10-28 HISTORY — DX: Personal history of other diseases of the circulatory system: Z86.79

## 2018-10-28 HISTORY — DX: Other chronic pain: G89.29

## 2018-10-28 HISTORY — DX: Low back pain, unspecified: M54.50

## 2018-10-28 HISTORY — PX: ATRIAL FIBRILLATION ABLATION: EP1191

## 2018-10-28 LAB — BASIC METABOLIC PANEL
Anion gap: 5 (ref 5–15)
BUN: 18 mg/dL (ref 6–20)
CALCIUM: 9.2 mg/dL (ref 8.9–10.3)
CO2: 23 mmol/L (ref 22–32)
CREATININE: 1.25 mg/dL — AB (ref 0.61–1.24)
Chloride: 111 mmol/L (ref 98–111)
Glucose, Bld: 137 mg/dL — ABNORMAL HIGH (ref 70–99)
Potassium: 3.9 mmol/L (ref 3.5–5.1)
SODIUM: 139 mmol/L (ref 135–145)

## 2018-10-28 LAB — POCT ACTIVATED CLOTTING TIME
ACTIVATED CLOTTING TIME: 279 s
ACTIVATED CLOTTING TIME: 307 s
ACTIVATED CLOTTING TIME: 180 s
ACTIVATED CLOTTING TIME: 191 s
Activated Clotting Time: 241 seconds

## 2018-10-28 LAB — GLUCOSE, CAPILLARY
GLUCOSE-CAPILLARY: 165 mg/dL — AB (ref 70–99)
Glucose-Capillary: 139 mg/dL — ABNORMAL HIGH (ref 70–99)
Glucose-Capillary: 170 mg/dL — ABNORMAL HIGH (ref 70–99)
Glucose-Capillary: 210 mg/dL — ABNORMAL HIGH (ref 70–99)

## 2018-10-28 SURGERY — ATRIAL FIBRILLATION ABLATION
Anesthesia: General

## 2018-10-28 MED ORDER — LITHIUM CARBONATE ER 450 MG PO TBCR
900.0000 mg | EXTENDED_RELEASE_TABLET | Freq: Every day | ORAL | Status: DC
  Administered 2018-10-28: 900 mg via ORAL
  Filled 2018-10-28: qty 2

## 2018-10-28 MED ORDER — SERTRALINE HCL 50 MG PO TABS
50.0000 mg | ORAL_TABLET | Freq: Every day | ORAL | Status: DC
  Administered 2018-10-28: 50 mg via ORAL
  Filled 2018-10-28: qty 1

## 2018-10-28 MED ORDER — LOSARTAN POTASSIUM 50 MG PO TABS
100.0000 mg | ORAL_TABLET | Freq: Every day | ORAL | Status: DC
  Administered 2018-10-28 – 2018-10-29 (×2): 100 mg via ORAL
  Filled 2018-10-28 (×2): qty 2

## 2018-10-28 MED ORDER — METOPROLOL TARTRATE 25 MG PO TABS
25.0000 mg | ORAL_TABLET | Freq: Two times a day (BID) | ORAL | Status: DC
  Administered 2018-10-28 – 2018-10-29 (×2): 25 mg via ORAL
  Filled 2018-10-28 (×2): qty 1

## 2018-10-28 MED ORDER — INSULIN ASPART 100 UNIT/ML ~~LOC~~ SOLN
0.0000 [IU] | Freq: Three times a day (TID) | SUBCUTANEOUS | Status: DC
  Administered 2018-10-29: 2 [IU] via SUBCUTANEOUS

## 2018-10-28 MED ORDER — HEPARIN (PORCINE) IN NACL 1000-0.9 UT/500ML-% IV SOLN
INTRAVENOUS | Status: AC
  Filled 2018-10-28: qty 500

## 2018-10-28 MED ORDER — SODIUM CHLORIDE 0.9% FLUSH: 3.0000 mL | INTRAVENOUS | Status: DC | PRN

## 2018-10-28 MED ORDER — SODIUM CHLORIDE 0.9 % IV SOLN
INTRAVENOUS | Status: DC
  Administered 2018-10-28: 10:00:00 via INTRAVENOUS

## 2018-10-28 MED ORDER — SUCCINYLCHOLINE CHLORIDE 20 MG/ML IJ SOLN
INTRAMUSCULAR | Status: DC | PRN
  Administered 2018-10-28: 140 mg via INTRAVENOUS

## 2018-10-28 MED ORDER — HEPARIN SODIUM (PORCINE) 1000 UNIT/ML IJ SOLN
INTRAMUSCULAR | Status: DC | PRN
  Administered 2018-10-28: 6000 [IU] via INTRAVENOUS
  Administered 2018-10-28: 3000 [IU] via INTRAVENOUS
  Administered 2018-10-28: 16000 [IU] via INTRAVENOUS
  Administered 2018-10-28: 8000 [IU] via INTRAVENOUS

## 2018-10-28 MED ORDER — SODIUM CHLORIDE 0.9% FLUSH
3.0000 mL | Freq: Two times a day (BID) | INTRAVENOUS | Status: DC
  Administered 2018-10-28: 23:00:00 3 mL via INTRAVENOUS

## 2018-10-28 MED ORDER — NITROGLYCERIN 0.4 MG SL SUBL: 0.4000 mg | SUBLINGUAL_TABLET | SUBLINGUAL | Status: DC | PRN

## 2018-10-28 MED ORDER — LORAZEPAM 1 MG PO TABS
1.0000 mg | ORAL_TABLET | Freq: Every day | ORAL | Status: DC
  Administered 2018-10-28: 23:00:00 1 mg via ORAL
  Filled 2018-10-28: qty 1

## 2018-10-28 MED ORDER — HEPARIN (PORCINE) IN NACL 1000-0.9 UT/500ML-% IV SOLN
INTRAVENOUS | Status: DC | PRN
  Administered 2018-10-28 (×5): 500 mL

## 2018-10-28 MED ORDER — ROCURONIUM BROMIDE 100 MG/10ML IV SOLN
INTRAVENOUS | Status: DC | PRN
  Administered 2018-10-28 (×3): 20 mg via INTRAVENOUS

## 2018-10-28 MED ORDER — ONDANSETRON HCL 4 MG/2ML IJ SOLN
INTRAMUSCULAR | Status: DC | PRN
  Administered 2018-10-28: 4 mg via INTRAVENOUS

## 2018-10-28 MED ORDER — INFLUENZA VAC SPLIT QUAD 0.5 ML IM SUSY
0.5000 mL | PREFILLED_SYRINGE | INTRAMUSCULAR | Status: AC
  Administered 2018-10-29: 08:00:00 0.5 mL via INTRAMUSCULAR
  Filled 2018-10-28: qty 0.5

## 2018-10-28 MED ORDER — PNEUMOCOCCAL VAC POLYVALENT 25 MCG/0.5ML IJ INJ
0.5000 mL | INJECTION | INTRAMUSCULAR | Status: AC
  Administered 2018-10-29: 0.5 mL via INTRAMUSCULAR
  Filled 2018-10-28: qty 0.5

## 2018-10-28 MED ORDER — GLIMEPIRIDE 4 MG PO TABS
2.0000 mg | ORAL_TABLET | Freq: Two times a day (BID) | ORAL | Status: DC
  Administered 2018-10-28 – 2018-10-29 (×2): 2 mg via ORAL
  Filled 2018-10-28 (×2): qty 1

## 2018-10-28 MED ORDER — SUGAMMADEX SODIUM 200 MG/2ML IV SOLN
INTRAVENOUS | Status: DC | PRN
  Administered 2018-10-28: 200 mg via INTRAVENOUS

## 2018-10-28 MED ORDER — PROTAMINE SULFATE 10 MG/ML IV SOLN
INTRAVENOUS | Status: DC | PRN
  Administered 2018-10-28: 50 mg via INTRAVENOUS

## 2018-10-28 MED ORDER — HEPARIN SODIUM (PORCINE) 1000 UNIT/ML IJ SOLN
INTRAMUSCULAR | Status: AC
  Filled 2018-10-28: qty 1

## 2018-10-28 MED ORDER — VITAMIN C 500 MG PO TABS
3000.0000 mg | ORAL_TABLET | Freq: Every day | ORAL | Status: DC
  Administered 2018-10-28: 3000 mg via ORAL
  Filled 2018-10-28 (×2): qty 6

## 2018-10-28 MED ORDER — BUPIVACAINE HCL (PF) 0.25 % IJ SOLN
INTRAMUSCULAR | Status: DC | PRN
  Administered 2018-10-28: 30 mL

## 2018-10-28 MED ORDER — BUPIVACAINE HCL (PF) 0.25 % IJ SOLN
INTRAMUSCULAR | Status: AC
  Filled 2018-10-28: qty 30

## 2018-10-28 MED ORDER — TOPIRAMATE 25 MG PO TABS
100.0000 mg | ORAL_TABLET | Freq: Every day | ORAL | Status: DC
  Administered 2018-10-28: 100 mg via ORAL
  Filled 2018-10-28: qty 4

## 2018-10-28 MED ORDER — DOBUTAMINE IN D5W 4-5 MG/ML-% IV SOLN
INTRAVENOUS | Status: DC | PRN
  Administered 2018-10-28: 20 ug/kg/min via INTRAVENOUS

## 2018-10-28 MED ORDER — RISPERIDONE 2 MG PO TABS
2.0000 mg | ORAL_TABLET | Freq: Every day | ORAL | Status: DC
  Administered 2018-10-28: 23:00:00 2 mg via ORAL
  Filled 2018-10-28: qty 1

## 2018-10-28 MED ORDER — PANTOPRAZOLE SODIUM 40 MG PO TBEC
40.0000 mg | DELAYED_RELEASE_TABLET | Freq: Every day | ORAL | Status: DC
  Administered 2018-10-28 – 2018-10-29 (×2): 40 mg via ORAL
  Filled 2018-10-28 (×2): qty 1

## 2018-10-28 MED ORDER — DOBUTAMINE IN D5W 4-5 MG/ML-% IV SOLN
INTRAVENOUS | Status: AC
  Filled 2018-10-28: qty 250

## 2018-10-28 MED ORDER — ONDANSETRON HCL 4 MG/2ML IJ SOLN: 4.0000 mg | Freq: Four times a day (QID) | INTRAMUSCULAR | Status: DC | PRN

## 2018-10-28 MED ORDER — FENTANYL CITRATE (PF) 100 MCG/2ML IJ SOLN
INTRAMUSCULAR | Status: DC | PRN
  Administered 2018-10-28 (×2): 50 ug via INTRAVENOUS

## 2018-10-28 MED ORDER — INSULIN ASPART 100 UNIT/ML ~~LOC~~ SOLN
0.0000 [IU] | Freq: Every day | SUBCUTANEOUS | Status: DC
  Administered 2018-10-28: 23:00:00 2 [IU] via SUBCUTANEOUS

## 2018-10-28 MED ORDER — DEXAMETHASONE SODIUM PHOSPHATE 4 MG/ML IJ SOLN
INTRAMUSCULAR | Status: DC | PRN
  Administered 2018-10-28: 10 mg via INTRAVENOUS

## 2018-10-28 MED ORDER — OFF THE BEAT BOOK
Freq: Once | Status: AC
  Administered 2018-10-28: 23:00:00
  Filled 2018-10-28: qty 1

## 2018-10-28 MED ORDER — PROPOFOL 10 MG/ML IV BOLUS
INTRAVENOUS | Status: DC | PRN
  Administered 2018-10-28: 40 mg via INTRAVENOUS
  Administered 2018-10-28: 50 mg via INTRAVENOUS
  Administered 2018-10-28: 200 mg via INTRAVENOUS

## 2018-10-28 MED ORDER — LIDOCAINE 2% (20 MG/ML) 5 ML SYRINGE
INTRAMUSCULAR | Status: DC | PRN
  Administered 2018-10-28: 100 mg via INTRAVENOUS

## 2018-10-28 MED ORDER — SODIUM CHLORIDE 0.9 % IV SOLN: 250.0000 mL | INTRAVENOUS | Status: DC | PRN

## 2018-10-28 MED ORDER — PIOGLITAZONE HCL 30 MG PO TABS
30.0000 mg | ORAL_TABLET | Freq: Every day | ORAL | Status: DC
  Administered 2018-10-29: 07:00:00 30 mg via ORAL
  Filled 2018-10-28: qty 1

## 2018-10-28 MED ORDER — ACETAMINOPHEN 325 MG PO TABS
650.0000 mg | ORAL_TABLET | ORAL | Status: DC | PRN
  Administered 2018-10-28: 650 mg via ORAL
  Filled 2018-10-28: qty 2

## 2018-10-28 MED ORDER — TRAZODONE HCL 50 MG PO TABS
100.0000 mg | ORAL_TABLET | Freq: Every day | ORAL | Status: DC
  Administered 2018-10-28: 100 mg via ORAL
  Filled 2018-10-28: qty 2

## 2018-10-28 MED ORDER — HEPARIN SODIUM (PORCINE) 1000 UNIT/ML IJ SOLN
INTRAMUSCULAR | Status: DC | PRN
  Administered 2018-10-28: 1000 [IU] via INTRAVENOUS

## 2018-10-28 MED ORDER — MIDAZOLAM HCL 5 MG/5ML IJ SOLN
INTRAMUSCULAR | Status: DC | PRN
  Administered 2018-10-28: 2 mg via INTRAVENOUS

## 2018-10-28 MED ORDER — APIXABAN 5 MG PO TABS
5.0000 mg | ORAL_TABLET | Freq: Two times a day (BID) | ORAL | Status: DC
  Administered 2018-10-28 – 2018-10-29 (×2): 5 mg via ORAL
  Filled 2018-10-28 (×2): qty 1

## 2018-10-28 SURGICAL SUPPLY — 19 items
BLANKET WARM UNDERBOD FULL ACC (MISCELLANEOUS) ×1 IMPLANT
CATH MAPPNG PENTARAY F 2-6-2MM (CATHETERS) IMPLANT
CATH SMTCH THERMOCOOL SF DF (CATHETERS) ×1 IMPLANT
CATH SOUNDSTAR ECO REPROCESSED (CATHETERS) ×1 IMPLANT
CATH WEBSTER BI DIR CS D-F CRV (CATHETERS) ×1 IMPLANT
COVER SWIFTLINK CONNECTOR (BAG) ×1 IMPLANT
PACK EP LATEX FREE (CUSTOM PROCEDURE TRAY) ×2
PACK EP LF (CUSTOM PROCEDURE TRAY) ×1 IMPLANT
PAD PRO RADIOLUCENT 2001M-C (PAD) ×2 IMPLANT
PATCH CARTO3 (PAD) ×1 IMPLANT
PENTARAY F 2-6-2MM (CATHETERS) ×2
SHEATH AVANTI 11F 11CM (SHEATH) ×1 IMPLANT
SHEATH BAYLIS SUREFLEX  M 8.5 (SHEATH) ×2
SHEATH BAYLIS SUREFLEX M 8.5 (SHEATH) IMPLANT
SHEATH BAYLIS TRANSSEPTAL 98CM (NEEDLE) ×1 IMPLANT
SHEATH PINNACLE 7F 10CM (SHEATH) ×1 IMPLANT
SHEATH PINNACLE 8F 10CM (SHEATH) ×2 IMPLANT
SHEATH PINNACLE 9F 10CM (SHEATH) ×2 IMPLANT
TUBING SMART ABLATE COOLFLOW (TUBING) ×2 IMPLANT

## 2018-10-28 NOTE — Transfer of Care (Signed)
Immediate Anesthesia Transfer of Care Note  Patient: Gary Grimes  Procedure(s) Performed: ATRIAL FIBRILLATION ABLATION (N/A )  Patient Location: Cath Lab  Anesthesia Type:General  Level of Consciousness: awake  Airway & Oxygen Therapy: Patient Spontanous Breathing and Patient connected to nasal cannula oxygen  Post-op Assessment: Report given to RN and Post -op Vital signs reviewed and stable  Post vital signs: Reviewed and stable  Last Vitals:  Vitals Value Taken Time  BP    Temp    Pulse 77 10/28/2018  2:18 PM  Resp 13 10/28/2018  2:18 PM  SpO2 96 % 10/28/2018  2:18 PM  Vitals shown include unvalidated device data.  Last Pain:  Vitals:   10/28/18 0902  TempSrc: Oral         Complications: No apparent anesthesia complications

## 2018-10-28 NOTE — H&P (Signed)
Gary Grimes has presented today for surgery, with the diagnosis of atrial fibrillation.  The various methods of treatment have been discussed with the patient and family. After consideration of risks, benefits and other options for treatment, the patient has consented to  Procedure(s): Catheter ablation as a surgical intervention .  Risks include but not limited to bleeding, tamponade, heart block, stroke, damage to surrounding organs, among others. The patient's history has been reviewed, patient examined, no change in status, stable for surgery.  I have reviewed the patient's chart and labs.  Questions were answered to the patient's satisfaction.    Gary Trotta Curt Bears, MD 10/28/2018 10:57 AM

## 2018-10-28 NOTE — Anesthesia Procedure Notes (Signed)
Procedure Name: Intubation Date/Time: 10/28/2018 11:22 AM Performed by: Lieutenant Diego, CRNA Pre-anesthesia Checklist: Patient identified, Emergency Drugs available, Suction available and Patient being monitored Patient Re-evaluated:Patient Re-evaluated prior to induction Oxygen Delivery Method: Circle system utilized Preoxygenation: Pre-oxygenation with 100% oxygen Induction Type: IV induction Ventilation: Mask ventilation without difficulty Laryngoscope Size: Glidescope and 3 Grade View: Grade I Tube type: Oral Tube size: 7.5 mm Number of attempts: 1 Airway Equipment and Method: Stylet and Video-laryngoscopy Placement Confirmation: ETT inserted through vocal cords under direct vision,  positive ETCO2 and breath sounds checked- equal and bilateral Secured at: 23 cm Tube secured with: Tape Dental Injury: Teeth and Oropharynx as per pre-operative assessment  Difficulty Due To: Difficulty was anticipated, Difficult Airway- due to large tongue and Difficult Airway- due to limited oral opening Future Recommendations: Recommend- induction with short-acting agent, and alternative techniques readily available Comments: Elective glide, grade one view, ett passes easy through VC. Dr Daiva Huge at bedside. +etco2, =BBS

## 2018-10-28 NOTE — Discharge Instructions (Signed)
Post procedure care instructions No driving for 4 days. No lifting over 5 lbs for 1 week. No vigorous or sexual activity for 1 week. You may return to work on 11/04/18. Keep procedure site clean & dry. If you notice increased pain, swelling, bleeding or pus, call/return!  You may shower, but no soaking baths/hot tubs/pools for 1 week.

## 2018-10-28 NOTE — Care Management Note (Signed)
Case Management Note  Patient Details  Name: Gary Grimes MRN: [id number] Date of Birth: [birth year]  Subjective/Objective:  From home, has PCP, s/p Afib ablation, he gets meds thru med [facility] in [place] , but if he is put on something new will need match letter.                  Action/Plan: NCM will follow for transition of care needs.   Expected Discharge Date:                  Expected Discharge Plan:  Home/Self Care  In-House Referral:     Discharge planning Services  CM Consult  Post Acute Care Choice:    Choice offered to:     DME Arranged:    DME Agency:     HH Arranged:    HH Agency:     Status of Service:  In process, will continue to follow  If discussed at Long Length of Stay Meetings, dates discussed:    Additional Comments:  Zenon Mayo, RN 10/28/2018, 5:39 PM

## 2018-10-28 NOTE — Anesthesia Postprocedure Evaluation (Signed)
Anesthesia Post Note  Patient: Gary Grimes  Procedure(s) Performed: ATRIAL FIBRILLATION ABLATION (N/A )     Patient location during evaluation: PACU Anesthesia Type: General Level of consciousness: awake and alert Pain management: pain level controlled Vital Signs Assessment: post-procedure vital signs reviewed and stable Respiratory status: spontaneous breathing, nonlabored ventilation, respiratory function stable and patient connected to nasal cannula oxygen Cardiovascular status: blood pressure returned to baseline and stable Postop Assessment: no apparent nausea or vomiting Anesthetic complications: no    Last Vitals:  Vitals:   10/28/18 1450 10/28/18 1455  BP: (!) 143/66 (!) 143/67  Pulse: 70 67  Resp: 16 15  Temp:    SpO2: 98%     Last Pain:  Vitals:   10/28/18 1422  TempSrc: Temporal  PainSc: 0-No pain                 Athalie Newhard,W. EDMOND

## 2018-10-28 NOTE — Progress Notes (Addendum)
Site area: RFA x 2---RFV x 2 Site Prior to Removal:  Level 0/0 Pressure Applied For:20 min each Manual:   yes Patient Status During Pull:  stable Post Pull Site:  Level 0/0 Post Pull Instructions Given:  yes Post Pull Pulses Present: paplpable Dressing Applied:  clear Bedrest begins @1545  till 1945  Comments:

## 2018-10-29 ENCOUNTER — Encounter (HOSPITAL_COMMUNITY): Payer: Self-pay | Admitting: Physician Assistant

## 2018-10-29 DIAGNOSIS — Z9889 Other specified postprocedural states: Secondary | ICD-10-CM

## 2018-10-29 DIAGNOSIS — Z8679 Personal history of other diseases of the circulatory system: Secondary | ICD-10-CM

## 2018-10-29 LAB — GLUCOSE, CAPILLARY: GLUCOSE-CAPILLARY: 184 mg/dL — AB (ref 70–99)

## 2018-10-29 NOTE — Discharge Summary (Addendum)
Gary Grimes  Discharge Summary    Patient ID: Gary Grimes MRN: [id number]; DOB: 1961/08/09  Admit date: 10/28/2018 Discharge date: 10/29/2018  Primary Care Provider: Shirline Frees, MD  Primary Cardiologist: Dr. [name] Primary Electrophysiologist:  Dr. Curt Bears  Discharge Diagnoses    Principal Problem:   S/P ablation of atrial fibrillation Active Problems:   MDD (major depressive disorder), recurrent severe, without psychosis ([place])   Bipolar disorder, manic ([place])   Paroxysmal atrial fibrillation ([place])   Essential hypertension   Diabetes mellitus due to underlying condition with unspecified complications ([place])   Sleep apnea   Allergies Allergies  Allergen Reactions  . Morphine Other (See Comments)    PT BECAME DELIRIOUS     Diagnostic Studies/Procedures    Afib ablation 10/28/18 Conclusion  SURGEON:  Will Curt Bears, MD  PREPROCEDURE DIAGNOSES: 1. Paroxysmal atrial fibrillation.  POSTPROCEDURE DIAGNOSES: 1. Paroxysmal  atrial fibrillation.  PROCEDURES: 1. Comprehensive electrophysiologic study. 2. Coronary sinus pacing and recording. 3. Three-dimensional mapping of atrial fibrillation with additional mapping and ablation of a second discrete focus 4. Ablation of atrial fibrillation with additional mapping and ablation of a second discrete focus 5. Intracardiac echocardiography. 6. Transseptal puncture of an intact septum. 7. Arrhythmia induction with pacing with dobutamine infusion  CONCLUSIONS: 1. Sinus rhythm upon presentation.   2. Successful electrical isolation and anatomical encircling of all four pulmonary veins with radiofrequency current. 3. No inducible arrhythmias following ablation both on and off of dobutamine 4. No early apparent complications.     _____________   History of Present Illness     Gary Grimes is a 57 y.o. male with a history of hypertension, hyperlipidemia, OSA  on CPAP, morbid obesity and symptomatic atrial fibrillation who presented to [facility] on 10/28/18 for planned atrial fibrillation ablation.   The patient has been evaluated in the office by Dr. Curt Bears for symptomatic atrial fibrillation. Given young age, he was not interested in medical management and afib ablation was set up for 10/28/18.  Hospital Course     Consultants: none  Symptomatic atrial fibrillation: s/p successful electrical isolation and anatomical encircling of all four pulmonary veins with radiofrequency current. ECG today shows NSR. Groin site clear. Follow up as arranged below.  _____________  Discharge Vitals Blood pressure (!) 105/47, pulse (!) 57, temperature 97.8 F (36.6 C), resp. rate 11, height 6' (1.829 m), weight 135.3 kg, SpO2 96 %.  Filed Weights   10/28/18 0902 10/29/18 0412  Weight: 133.4 kg 135.3 kg    Labs & Radiologic Studies    CBC No results for input(s): WBC, NEUTROABS, HGB, HCT, MCV, PLT in the last 72 hours. Basic Metabolic Panel Recent Labs    10/28/18 0928  NA 139  K 3.9  CL 111  CO2 23  GLUCOSE 137*  BUN 18  CREATININE 1.25*  CALCIUM 9.2   Liver Function Tests No results for input(s): AST, ALT, ALKPHOS, BILITOT, PROT, ALBUMIN in the last 72 hours. No results for input(s): LIPASE, AMYLASE in the last 72 hours. Cardiac Enzymes No results for input(s): CKTOTAL, CKMB, CKMBINDEX, TROPONINI in the last 72 hours. BNP Invalid input(s): POCBNP D-Dimer No results for input(s): DDIMER in the last 72 hours. Hemoglobin A1C No results for input(s): HGBA1C in the last 72 hours. Fasting Lipid Panel No results for input(s): CHOL, HDL, LDLCALC, TRIG, CHOLHDL, LDLDIRECT in the last 72 hours. Thyroid Function Tests No results for input(s): TSH, T4TOTAL, T3FREE, THYROIDAB in the last 72  hours.  Invalid input(s): FREET3 _____________  Ct Cardiac Morph/pulm Vein W/cm&w/o Ca Score  Addendum Date: 10/25/2018   ADDENDUM REPORT: 10/25/2018 14:01  CLINICAL DATA:  Atrial fibrillation scheduled for an ablation. EXAM: Cardiac CT/CTA TECHNIQUE: The patient was scanned on a Siemens Force 664 slice  scanner. FINDINGS: A 120 kV prospective scan was triggered in the ascending thoracic aorta at 140 HU's. Gantry rotation speed was 250 msecs and collimation was .6 mm. No beta blockade and no NTG was given. The 3D data set was reconstructed for best systolic and diastolic phases along with delayed images of the LAA Images analyzed on a dedicated work station using MPR, MIP and VRT modes. The patient received 80 cc of contrast. There was moderate RAE and mild LAE. There was no LAA thrombus. There was no ASD/VSD/PFO. Normal PV anatomy including a rather large right middle PV. No pericardial effusion. Normal aortic root 3.5 cm. Esophagus courses closest to the RLPV ostium RUPV: Ostium 19.4 mm  area 3.78 cm2 RLPV:  Ostium 17.4 mm  area 2.4 cm2 LUPV:  Ostium 17.7 mm area 2.84 cm2 LLPV:  Ostium 23.7 mm  area 3.42 cm2    elliptical RMPV: Ostium 9 mm        area .5 cm2 Calcium score noted in RCA and LAD IMPRESSION: 1.  No LAA thrombus 2.  Moderate RAE and mild LAE 3.  Normal PV anatomy including a right middle PV 4.  Normal aortic root 3.5 cm 5.  No pericardial effusion 6.  Esophagus courses closest to the RLPV ostium 7. Coronary calcium score of 372 which is 89 th percentile for age and sex [place] Rouge Electronically Signed   By: Jenkins Rouge M.D.   On: 10/25/2018 14:01   Result Date: 10/25/2018 EXAM: OVER-READ INTERPRETATION  CT CHEST The following report is an over-read performed by radiologist Dr. Rolm Baptise of [facility], [place] on 10/25/2018. This over-read does not include interpretation of cardiac or coronary anatomy or pathology. The coronary CTA interpretation by the cardiologist is attached. COMPARISON:  04/12/2012 FINDINGS: Vascular: Heart is upper limits normal in size. Aorta is normal caliber. Mediastinum/Nodes: No adenopathy in the lower mediastinum or  hila. Lungs/Pleura: Visualized lungs clear.  No effusions. Upper Abdomen: Imaging into the upper abdomen shows no acute findings. Musculoskeletal: Chest wall soft tissues are unremarkable. No acute bony abnormality. IMPRESSION: No acute or significant extracardiac abnormality. Electronically Signed: By: Rolm Baptise M.D. On: 10/25/2018 13:49   Disposition   Pt is being discharged home today in good condition.  Follow-up Plans & Appointments    Follow-up Information    [facility] Follow up.   Specialty:  Cardiology Why:  11/28/18 @ 1:30PM Contact information: [address] [id number] [place] [address] [phone]       Constance Haw, MD Follow up.   Specialty:  Cardiology Why:  02/06/19 @ 3:30PM Contact information: [place] [address] [phone]            Discharge Medications   Allergies as of 10/29/2018      Reactions   Morphine Other (See Comments)   PT BECAME DELIRIOUS       Medication List    TAKE these medications   ACTOS 30 MG tablet Generic drug:  pioglitazone Take 30 mg by mouth every morning.   ELIQUIS 5 MG Tabs tablet Generic drug:  apixaban Take 5 mg by mouth 2 (two) times daily.  glimepiride 2 MG tablet Commonly known as:  AMARYL Take 2 mg by mouth 2 (two) times daily.   HUMALOG 100 UNIT/ML injection Generic drug:  insulin lispro Inject 2-10 Units into the skin 3 (three) times daily as needed for high blood sugar.   lithium carbonate 300 MG CR tablet Commonly known as:  LITHOBID Take 900 mg by mouth at bedtime.   LORazepam 1 MG tablet Commonly known as:  ATIVAN Take 1 tablet by mouth at bedtime.   losartan 100 MG tablet Commonly known as:  COZAAR Take 1 tablet (100 mg total) by mouth every evening. What changed:  when to take this   metoprolol tartrate 25 MG tablet Commonly known as:  LOPRESSOR Take 25 mg by mouth 2 (two) times daily  with a meal.   nitroGLYCERIN 0.4 MG SL tablet Commonly known as:  NITROSTAT Place 1 tablet (0.4 mg total) under the tongue every 5 (five) minutes as needed for up to 25 days for chest pain.   omeprazole 40 MG capsule Commonly known as:  PRILOSEC Take 40 mg by mouth daily as needed (HEARTBURN).   risperiDONE 2 MG tablet Commonly known as:  RISPERDAL Take 2 mg by mouth at bedtime.   sertraline 50 MG tablet Commonly known as:  ZOLOFT Take 50 mg by mouth at bedtime.   topiramate 25 MG tablet Commonly known as:  TOPAMAX Take 100 mg by mouth at bedtime.   traZODone 100 MG tablet Commonly known as:  DESYREL Take 1 tablet (100 mg total) by mouth at bedtime as needed for sleep. What changed:  when to take this   vitamin C 1000 MG tablet Take 3,000 mg by mouth daily.           Outstanding Labs/Studies   none  Duration of Discharge Encounter   Greater than 30 minutes including physician time.  Signed, Angelena Form, PA-C 10/29/2018, 8:04 AM [phone]  I have seen and examined this patient with Angelena Form.  Agree with above, note added to reflect my findings.  On exam, RRR, no murmurs, lungs clear.  Patient had AF ablation for paroxysmal atrial fibrillation. Tolerated the procedure well without complaint this morning. Plan for discharge today with follow up in EP clinic.  Will M. Camnitz MD 10/29/2018 8:20 AM

## 2018-10-31 ENCOUNTER — Telehealth: Payer: Self-pay | Admitting: Cardiology

## 2018-10-31 ENCOUNTER — Encounter (HOSPITAL_COMMUNITY): Payer: Self-pay | Admitting: Cardiology

## 2018-10-31 NOTE — Telephone Encounter (Signed)
° °  Patient has questions regarding care following ablation. Please call

## 2018-10-31 NOTE — Telephone Encounter (Signed)
S/p ablation 11/1 Pt asked if he could take the "bandage" off his groin now.  Advised pt he may take the bandage off.  Advised to avoid soaking groin area until healed. Patient verbalized understanding and agreeable to plan.

## 2018-11-28 ENCOUNTER — Ambulatory Visit (HOSPITAL_COMMUNITY): Payer: Self-pay | Admitting: Nurse Practitioner

## 2018-12-19 ENCOUNTER — Ambulatory Visit (HOSPITAL_COMMUNITY)
Admission: RE | Admit: 2018-12-19 | Discharge: 2018-12-19 | Disposition: A | Discharge status: Home / Self Care | Payer: Self-pay | Source: Ambulatory Visit | Attending: Nurse Practitioner | Admitting: Nurse Practitioner

## 2018-12-19 ENCOUNTER — Encounter (HOSPITAL_COMMUNITY): Payer: Self-pay | Admitting: Nurse Practitioner

## 2018-12-19 VITALS — BP 136/68 | HR 89 | Ht 72.0 in | Wt 298.2 lb

## 2018-12-19 DIAGNOSIS — M1990 Unspecified osteoarthritis, unspecified site: Secondary | ICD-10-CM | POA: Insufficient documentation

## 2018-12-19 DIAGNOSIS — Z7901 Long term (current) use of anticoagulants: Secondary | ICD-10-CM | POA: Insufficient documentation

## 2018-12-19 DIAGNOSIS — G8929 Other chronic pain: Secondary | ICD-10-CM | POA: Insufficient documentation

## 2018-12-19 DIAGNOSIS — E7800 Pure hypercholesterolemia, unspecified: Secondary | ICD-10-CM | POA: Insufficient documentation

## 2018-12-19 DIAGNOSIS — I10 Essential (primary) hypertension: Secondary | ICD-10-CM | POA: Insufficient documentation

## 2018-12-19 DIAGNOSIS — Z9889 Other specified postprocedural states: Secondary | ICD-10-CM

## 2018-12-19 DIAGNOSIS — Z8679 Personal history of other diseases of the circulatory system: Secondary | ICD-10-CM

## 2018-12-19 DIAGNOSIS — F319 Bipolar disorder, unspecified: Secondary | ICD-10-CM | POA: Insufficient documentation

## 2018-12-19 DIAGNOSIS — M545 Low back pain: Secondary | ICD-10-CM | POA: Insufficient documentation

## 2018-12-19 DIAGNOSIS — Z79899 Other long term (current) drug therapy: Secondary | ICD-10-CM | POA: Insufficient documentation

## 2018-12-19 DIAGNOSIS — G4733 Obstructive sleep apnea (adult) (pediatric): Secondary | ICD-10-CM | POA: Insufficient documentation

## 2018-12-19 DIAGNOSIS — E119 Type 2 diabetes mellitus without complications: Secondary | ICD-10-CM | POA: Insufficient documentation

## 2018-12-19 DIAGNOSIS — I480 Paroxysmal atrial fibrillation: Secondary | ICD-10-CM

## 2018-12-19 DIAGNOSIS — I4891 Unspecified atrial fibrillation: Secondary | ICD-10-CM | POA: Insufficient documentation

## 2018-12-19 DIAGNOSIS — Z87891 Personal history of nicotine dependence: Secondary | ICD-10-CM | POA: Insufficient documentation

## 2018-12-19 DIAGNOSIS — Z794 Long term (current) use of insulin: Secondary | ICD-10-CM | POA: Insufficient documentation

## 2018-12-19 MED ORDER — METOPROLOL TARTRATE 25 MG PO TABS: 25.0000 mg | ORAL_TABLET | Freq: Two times a day (BID) | ORAL | 2 refills | Status: DC

## 2018-12-19 NOTE — Progress Notes (Signed)
Primary Care Physician: Shirline Frees, MD Referring Physician: Dr. Blanchie Dessert is a 57 y.o. male with a h/o afib s/p ablation one month ago. He had some afib over the weekend, in [place] today. He has been out of BB since Tuesday, as there was miscommunication with his PCP. No groin or swallowing issues.  Today, he denies symptoms of palpitations, chest pain, shortness of breath, orthopnea, PND, lower extremity edema, dizziness, presyncope, syncope, or neurologic sequela. The patient is tolerating medications without difficulties and is otherwise without complaint today.   Past Medical History:  Diagnosis Date  . Anginal pain ([place])    ER visit 01/14/2014 visit on chart   . Anxiety    pt denies  . Arthritis    'q inch of my body" (10/28/2018)  . Bipolar disorder ([place])   . Chronic lower back pain   . Depression    did get seen in er 4/13 for evaluation-psyc  . GERD (gastroesophageal reflux disease)   . Headache    "bad ones lately; weekly" (10/28/2018)  . High cholesterol   . High triglycerides   . Hypertension   . Levator syndrome 2001   history   . OSA on CPAP   . Panic attacks   . Perirectal abscess   . S/P ablation of atrial fibrillation   . Type II diabetes mellitus ([place])    Past Surgical History:  Procedure Laterality Date  . ANAL FISSURE REPAIR  08/05/2000   proctoscopy  . APPENDECTOMY  1984  . ATRIAL FIBRILLATION ABLATION  10/28/2018  . ATRIAL FIBRILLATION ABLATION N/A 10/28/2018   Procedure: ATRIAL FIBRILLATION ABLATION;  Surgeon: Constance Haw, MD;  Location: [facility];  Service: Cardiovascular;  Laterality: N/A;  . COLONOSCOPY    . HERNIA REPAIR    . INSERTION OF MESH N/A 01/29/2015   Procedure: INSERTION OF MESH;  Surgeon: Excell Seltzer, MD;  Location: [facility];  Service: General;  Laterality: N/A;  . IRRIGATION AND DEBRIDEMENT ABSCESS  02/18/2012   peri-rectal  . LEFT HEART CATH AND CORONARY ANGIOGRAPHY N/A 06/08/2018   Procedure:  LEFT HEART CATH AND CORONARY ANGIOGRAPHY;  Surgeon: Leonie Man, MD;  Location: [facility];  Service: Cardiovascular;  Laterality: N/A;  . SHOULDER ARTHROSCOPY Left ?2009   "repaired  AC joint; reattached bicept tendon"  . SHOULDER ARTHROSCOPY W/ LABRAL REPAIR Left 08/08/2007  . UMBILICAL HERNIA REPAIR  10/27/2010  . VENTRAL HERNIA REPAIR N/A 01/29/2015   Procedure: LAPAROSCOPIC VENTRAL HERNIA;  Surgeon: Excell Seltzer, MD;  Location: [facility];  Service: General;  Laterality: N/A;    Current Outpatient Medications  Medication Sig Dispense Refill  . apixaban (ELIQUIS) 5 MG TABS tablet Take 5 mg by mouth 2 (two) times daily.    . Ascorbic Acid (VITAMIN C) 1000 MG tablet Take 3,000 mg by mouth daily.    Marland Kitchen glimepiride (AMARYL) 2 MG tablet Take 2 mg by mouth 2 (two) times daily.     . insulin lispro (HUMALOG) 100 UNIT/ML injection Inject 2-10 Units into the skin 3 (three) times daily as needed for high blood sugar.     . lithium carbonate (LITHOBID) 300 MG CR tablet Take 900 mg by mouth at bedtime.   2  . LORazepam (ATIVAN) 1 MG tablet Take 1 tablet by mouth at bedtime.   5  . losartan (COZAAR) 100 MG tablet Take 1 tablet (100 mg total) by mouth every evening. (Patient taking differently: Take 100 mg  by mouth daily. )    . metoprolol tartrate (LOPRESSOR) 25 MG tablet Take 25 mg by mouth 2 (two) times daily with a meal.   2  . nitroGLYCERIN (NITROSTAT) 0.4 MG SL tablet Place 1 tablet (0.4 mg total) under the tongue every 5 (five) minutes as needed for up to 25 days for chest pain. 25 tablet 11  . omeprazole (PRILOSEC) 40 MG capsule Take 40 mg by mouth daily as needed (HEARTBURN).     . pioglitazone (ACTOS) 30 MG tablet Take 30 mg by mouth every morning.     . risperiDONE (RISPERDAL) 2 MG tablet Take 2 mg by mouth at bedtime.    . sertraline (ZOLOFT) 50 MG tablet Take 50 mg by mouth at bedtime.   2  . topiramate (TOPAMAX) 25 MG tablet Take 100 mg by mouth at bedtime.  1  . traZODone  (DESYREL) 100 MG tablet Take 1 tablet (100 mg total) by mouth at bedtime as needed for sleep. (Patient taking differently: Take 100 mg by mouth at bedtime. ) 30 tablet 0   No current facility-administered medications for this encounter.     Allergies  Allergen Reactions  . Morphine Other (See Comments)    PT BECAME DELIRIOUS     Social History   Socioeconomic History  . Marital status: Widowed    Spouse name: Not on file  . Number of children: Not on file  . Years of education: Not on file  . Highest education level: Not on file  Occupational History  . Not on file  Social Needs  . Financial resource strain: Not on file  . Food insecurity:    Worry: Not on file    Inability: Not on file  . Transportation needs:    Medical: Not on file    Non-medical: Not on file  Tobacco Use  . Smoking status: Former Smoker    Packs/day: 0.50    Years: 1.00    Pack years: 0.50    Types: Cigarettes    Last attempt to quit: 07/06/1977    Years since quitting: 41.4  . Smokeless tobacco: Never Used  Substance and Sexual Activity  . 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 Drug use: Never  . Sexual activity: Not on file  Lifestyle  . Physical activity:    Days per week: Not on file    Minutes per session: Not on file  . Stress: Not on file  Relationships  . Social connections:    Talks on phone: Not on file    Gets together: Not on file    Attends religious service: Not on file    Active member of club or organization: Not on file    Attends meetings of clubs or organizations: Not on file    Relationship status: Not on file  . Intimate partner violence:    Fear of current or ex partner: Not on file    Emotionally abused: Not on file    Physically abused: Not on file    Forced sexual activity: Not on file  Other Topics Concern  . Not on file  Social History Narrative  . Not on file    Family History  Problem Relation Age  of Onset  . Cancer Mother        breast and ovarian    ROS- All systems are reviewed and negative except as per the HPI above  Physical Exam: Vitals:  12/19/18 0836  BP: 136/68  Pulse: 89  SpO2: 97%  Weight: 135.3 kg  Height: 6' (1.829 m)   Wt Readings from Last 3 Encounters:  12/19/18 135.3 kg  10/29/18 135.3 kg  10/10/18 134.3 kg    Labs: Lab Results  Component Value Date   NA 139 10/28/2018   K 3.9 10/28/2018   CL 111 10/28/2018   CO2 23 10/28/2018   GLUCOSE 137 (H) 10/28/2018   BUN 18 10/28/2018   CREATININE 1.25 (H) 10/28/2018   CALCIUM 9.2 10/28/2018   Lab Results  Component Value Date   INR 1.01 02/18/2012   No results found for: CHOL, HDL, LDLCALC, TRIG   GEN- The patient is well appearing, alert and oriented x 3 today.   Head- normocephalic, atraumatic Eyes-  Sclera clear, conjunctiva pink Ears- hearing intact Oropharynx- clear Neck- supple, no JVP Lymph- no cervical lymphadenopathy Lungs- Clear to ausculation bilaterally, normal work of breathing Heart- Regular rate and rhythm, no murmurs, rubs or gallops, PMI not laterally displaced GI- soft, NT, ND, + BS Extremities- no clubbing, cyanosis, or edema MS- no significant deformity or atrophy Skin- no rash or lesion Psych- euthymic mood, full affect Neuro- strength and sensation are intact  EKG-NSR at 88 bpm, PR int 156 ms, qrs int 82 ms, qtc 452 ms Epic records reviewed   Assessment and Plan: 1. Afib  S/p ablation and is dong well Has some afib over the weekend but is off BB's Will renew rx metoprolol tartrate at  25 mg bid  Continue eliquis 5 mg bid Advised not to interrupt anticoagulation x  2 more months  F/u with Dr. Curt Bears as scheduled 2/24  Geroge Baseman. Elisandro Jarrett, [facility] [address] [place], [place] [address] [phone]

## 2019-02-01 ENCOUNTER — Ambulatory Visit: Payer: Self-pay | Admitting: Cardiology

## 2019-02-06 ENCOUNTER — Ambulatory Visit: Payer: Self-pay | Admitting: Cardiology

## 2019-02-20 ENCOUNTER — Ambulatory Visit: Payer: Self-pay | Admitting: Cardiology

## 2019-02-27 ENCOUNTER — Ambulatory Visit (INDEPENDENT_AMBULATORY_CARE_PROVIDER_SITE_OTHER): Payer: PRIVATE HEALTH INSURANCE | Admitting: Podiatry

## 2019-02-27 ENCOUNTER — Ambulatory Visit (INDEPENDENT_AMBULATORY_CARE_PROVIDER_SITE_OTHER): Payer: PRIVATE HEALTH INSURANCE

## 2019-02-27 ENCOUNTER — Encounter: Payer: Self-pay | Admitting: Podiatry

## 2019-02-27 VITALS — BP 135/80 | HR 69

## 2019-02-27 DIAGNOSIS — M7751 Other enthesopathy of right foot: Secondary | ICD-10-CM

## 2019-02-27 DIAGNOSIS — E1149 Type 2 diabetes mellitus with other diabetic neurological complication: Secondary | ICD-10-CM | POA: Diagnosis not present

## 2019-02-27 DIAGNOSIS — M7750 Other enthesopathy of unspecified foot: Secondary | ICD-10-CM

## 2019-02-27 DIAGNOSIS — M7752 Other enthesopathy of left foot: Secondary | ICD-10-CM

## 2019-02-27 DIAGNOSIS — M722 Plantar fascial fibromatosis: Secondary | ICD-10-CM | POA: Diagnosis not present

## 2019-02-27 MED ORDER — DICLOFENAC SODIUM 1 % TD GEL: 2.0000 g | Freq: Four times a day (QID) | TRANSDERMAL | 2 refills | Status: DC

## 2019-02-27 MED ORDER — PREGABALIN 75 MG PO CAPS: 75.0000 mg | ORAL_CAPSULE | Freq: Two times a day (BID) | ORAL | 2 refills | Status: DC

## 2019-02-27 NOTE — Progress Notes (Signed)
Subjective:   Patient ID: Gary Grimes, male   DOB: 58 y.o.   MRN: [id number]   HPI 58 year old male presents the office today for concerns of bilateral heel pain which is been ongoing for the last few months.  States is progressively getting worse.  He denies any recent injury or trauma.  He denies any significant swelling that he has noticed.  He said no recent treatment.  He also has concerns of diabetic neuropathy which is been ongoing for 1 year.  He states the burning is been getting worse.  He tried gabapentin without relief. Denies any falls.  He is currently taking Eliquis.  His last A1c was 5.2.    Review of Systems  All other systems reviewed and are negative.   Past Medical History:  Diagnosis Date  . Anginal pain ([place])    ER visit 01/14/2014 visit on chart   . Anxiety    pt denies  . Arthritis    'q inch of my body" (10/28/2018)  . Bipolar disorder ([place])   . Chronic lower back pain   . Depression    did get seen in er 4/13 for evaluation-psyc  . GERD (gastroesophageal reflux disease)   . Headache    "bad ones lately; weekly" (10/28/2018)  . High cholesterol   . High triglycerides   . Hypertension   . Levator syndrome 2001   history   . OSA on CPAP   . Panic attacks   . Perirectal abscess   . S/P ablation of atrial fibrillation   . Type II diabetes mellitus ([place])     Past Surgical History:  Procedure Laterality Date  . ANAL FISSURE REPAIR  08/05/2000   proctoscopy  . APPENDECTOMY  1984  . ATRIAL FIBRILLATION ABLATION  10/28/2018  . ATRIAL FIBRILLATION ABLATION N/A 10/28/2018   Procedure: ATRIAL FIBRILLATION ABLATION;  Surgeon: Constance Haw, MD;  Location: [facility];  Service: Cardiovascular;  Laterality: N/A;  . COLONOSCOPY    . HERNIA REPAIR    . INSERTION OF MESH N/A 01/29/2015   Procedure: INSERTION OF MESH;  Surgeon: Excell Seltzer, MD;  [facility];  Service: General;  Laterality: N/A;  . IRRIGATION AND DEBRIDEMENT ABSCESS   02/18/2012   peri-rectal  . LEFT HEART CATH AND CORONARY ANGIOGRAPHY N/A 06/08/2018   Procedure: LEFT HEART CATH AND CORONARY ANGIOGRAPHY;  Surgeon: Leonie Man, MD;  Location: [facility];  Service: Cardiovascular;  Laterality: N/A;  . SHOULDER ARTHROSCOPY Left ?2009   "repaired  AC joint; reattached bicept tendon"  . SHOULDER ARTHROSCOPY W/ LABRAL REPAIR Left 08/08/2007  . UMBILICAL HERNIA REPAIR  10/27/2010  . VENTRAL HERNIA REPAIR N/A 01/29/2015   Procedure: LAPAROSCOPIC VENTRAL HERNIA;  Surgeon: Excell Seltzer, MD;  [facility];  Service: General;  Laterality: N/A;     Current Outpatient Medications:  .  albuterol (PROVENTIL HFA;VENTOLIN HFA) 108 (90 Base) MCG/ACT inhaler, Inhale 2 puffs into the lungs every 6 (six) hours as needed., Disp: , Rfl:  .  apixaban (ELIQUIS) 5 MG TABS tablet, Take 5 mg by mouth 2 (two) times daily., Disp: , Rfl:  .  Ascorbic Acid (VITAMIN C) 1000 MG tablet, Take 3,000 mg by mouth daily., Disp: , Rfl:  .  busPIRone (BUSPAR) 5 MG tablet, TAKE 1 Tablet BY MOUTH EVERY MORNING AND TAKE 1 Tablet BY MOUTH EVERY EVENING, Disp: , Rfl:  .  glimepiride (AMARYL) 2 MG tablet, Take 2 mg by mouth 2 (two) times  daily. , Disp: , Rfl:  .  insulin lispro (HUMALOG) 100 UNIT/ML injection, Inject 2-10 Units into the skin 3 (three) times daily as needed for high blood sugar. , Disp: , Rfl:  .  lithium carbonate (LITHOBID) 300 MG CR tablet, Take 900 mg by mouth at bedtime. , Disp: , Rfl: 2 .  LORazepam (ATIVAN) 1 MG tablet, Take 1 tablet by mouth at bedtime. , Disp: , Rfl: 5 .  losartan (COZAAR) 100 MG tablet, Take 1 tablet (100 mg total) by mouth every evening. (Patient taking differently: Take 100 mg by mouth daily. ), Disp: , Rfl:  .  metoprolol tartrate (LOPRESSOR) 25 MG tablet, Take 1 tablet (25 mg total) by mouth 2 (two) times daily with a meal., Disp: 180 tablet, Rfl: 2 .  nitroGLYCERIN (NITROSTAT) 0.4 MG SL tablet, Place 1 tablet (0.4 mg total) under the  tongue every 5 (five) minutes as needed for up to 25 days for chest pain., Disp: 25 tablet, Rfl: 11 .  omeprazole (PRILOSEC) 40 MG capsule, Take 40 mg by mouth daily as needed (HEARTBURN). , Disp: , Rfl:  .  pioglitazone (ACTOS) 30 MG tablet, Take 30 mg by mouth every morning. , Disp: , Rfl:  .  risperiDONE (RISPERDAL) 2 MG tablet, Take 2 mg by mouth at bedtime., Disp: , Rfl:  .  sertraline (ZOLOFT) 50 MG tablet, Take 50 mg by mouth at bedtime. , Disp: , Rfl: 2 .  topiramate (TOPAMAX) 25 MG tablet, Take 100 mg by mouth at bedtime., Disp: , Rfl: 1 .  traZODone (DESYREL) 100 MG tablet, Take 1 tablet (100 mg total) by mouth at bedtime as needed for sleep. (Patient taking differently: Take 100 mg by mouth at bedtime. ), Disp: 30 tablet, Rfl: 0  Allergies  Allergen Reactions  . Morphine Other (See Comments)    PT BECAME DELIRIOUS          Objective:  Physical Exam   General: AAO x3, NAD  Dermatological: Skin is warm, dry and supple bilateral. Nails x 10 are well manicured; remaining integument appears unremarkable at this time. There are no open sores, no preulcerative lesions, no rash or signs of infection present.  Vascular: Dorsalis Pedis artery and Posterior Tibial artery pedal pulses are 2/4 bilateral with immedate capillary fill time.  There is no pain with calf compression, swelling, warmth, erythema.   Neruologic: Sensation decreased with SWMF.  Musculoskeletal: Tenderness to palpation along the plantar medial tubercle of the calcaneus at the insertion of plantar fascia on the left and right foot. There is no pain along the course of the plantar fascia within the arch of the foot. Plantar fascia appears to be intact. There is no pain with lateral compression of the calcaneus or pain with vibratory sensation. There is no pain along the course or insertion of the achilles tendon. No other areas of tenderness to bilateral lower extremities. Muscular strength 5/5 in all groups tested  bilateral.  Gait: Unassisted, Nonantalgic.       Assessment:   58 year old male with bilateral heel pain, plantar fasciitis; neuropathy    Plan:  -Treatment options discussed including all alternatives, risks, and complications -Etiology of symptoms were discussed -X-rays were obtained and reviewed with the patient.  No evidence of acute fracture or stress fracture. -Steroid injections performed bilaterally.  See procedure note below. -Prescribed Voltaren gel.  -Plantar fascial braces dispensed x2. -Discussed stretching, icing exercises daily. -Discussed shoe modifications and orthotics. -As she is tried gabapentin previously retry  Lyrica.  Discussed side effects and success rates.  Procedure: Injection Tendon/Ligament Discussed alternatives, risks, complications and verbal consent was obtained.  Location: Bilateral plantar fascia at the glabrous junction; medial approach. Skin Prep: Alcohol Injectate: 0.5cc 0.5% marcaine plain, 0.5 cc 2% lidocaine plain and, 1 cc kenalog 10. Disposition: Patient tolerated procedure well. Injection site dressed with a band-aid.  Post-injection care was discussed and return precautions discussed.   Trula Slade DPM

## 2019-02-27 NOTE — Patient Instructions (Addendum)
Diabetes Mellitus and Foot Care Foot care is an important part of your health, especially when you have diabetes. Diabetes may cause you to have problems because of poor blood flow (circulation) to your feet and legs, which can cause your skin to:  Become thinner and drier.  Break more easily.  Heal more slowly.  Peel and crack. You may also have nerve damage (neuropathy) in your legs and feet, causing decreased feeling in them. This means that you may not notice minor injuries to your feet that could lead to more serious problems. Noticing and addressing any potential problems early is the best way to prevent future foot problems. How to care for your feet Foot hygiene  Wash your feet daily with warm water and mild soap. Do not use hot water. Then, pat your feet and the areas between your toes until they are completely dry. Do not soak your feet as this can dry your skin.  Trim your toenails straight across. Do not dig under them or around the cuticle. File the edges of your nails with an emery board or nail file.  Apply a moisturizing lotion or petroleum jelly to the skin on your feet and to dry, brittle toenails. Use lotion that does not contain alcohol and is unscented. Do not apply lotion between your toes. Shoes and socks  Wear clean socks or stockings every day. Make sure they are not too tight. Do not wear knee-high stockings since they may decrease blood flow to your legs.  Wear shoes that fit properly and have enough cushioning. Always look in your shoes before you put them on to be sure there are no objects inside.  To break in new shoes, wear them for just a few hours a day. This prevents injuries on your feet. Wounds, scrapes, corns, and calluses  Check your feet daily for blisters, cuts, bruises, sores, and redness. If you cannot see the bottom of your feet, use a mirror or ask someone for help.  Do not cut corns or calluses or try to remove them with medicine.  If you  find a minor scrape, cut, or break in the skin on your feet, keep it and the skin around it clean and dry. You may clean these areas with mild soap and water. Do not clean the area with peroxide, alcohol, or iodine.  If you have a wound, scrape, corn, or callus on your foot, look at it several times a day to make sure it is healing and not infected. Check for: ? Redness, swelling, or pain. ? Fluid or blood. ? Warmth. ? Pus or a bad smell. General instructions  Do not cross your legs. This may decrease blood flow to your feet.  Do not use heating pads or hot water bottles on your feet. They may burn your skin. If you have lost feeling in your feet or legs, you may not know this is happening until it is too late.  Protect your feet from hot and cold by wearing shoes, such as at the beach or on hot pavement.  Schedule a complete foot exam at least once a year (annually) or more often if you have foot problems. If you have foot problems, report any cuts, sores, or bruises to your health care provider immediately. Contact a health care provider if:  You have a medical condition that increases your risk of infection and you have any cuts, sores, or bruises on your feet.  You have an injury that is not   healing.  You have redness on your legs or feet.  You feel burning or tingling in your legs or feet.  You have pain or cramps in your legs and feet.  Your legs or feet are numb.  Your feet always feel cold.  You have pain around a toenail. Get help right away if:  You have a wound, scrape, corn, or callus on your foot and: ? You have pain, swelling, or redness that gets worse. ? You have fluid or blood coming from the wound, scrape, corn, or callus. ? Your wound, scrape, corn, or callus feels warm to the touch. ? You have pus or a bad smell coming from the wound, scrape, corn, or callus. ? You have a fever. ? You have a red line going up your leg. Summary  Check your feet every day  for cuts, sores, red spots, swelling, and blisters.  Moisturize feet and legs daily.  Wear shoes that fit properly and have enough cushioning.  If you have foot problems, report any cuts, sores, or bruises to your health care provider immediately.  Schedule a complete foot exam at least once a year (annually) or more often if you have foot problems. This information is not intended to replace advice given to you by your health care provider. Make sure you discuss any questions you have with your health care provider. Document Released: 12/11/2000 Document Revised: 01/26/2018 Document Reviewed: 01/15/2017 Elsevier Interactive Patient Education  2019 [employer]   Plantar Fasciitis (Heel Spur Syndrome) with Rehab The plantar fascia is a fibrous, ligament-like, soft-tissue structure that spans the bottom of the foot. Plantar fasciitis is a condition that causes pain in the foot due to inflammation of the tissue. SYMPTOMS   Pain and tenderness on the underneath side of the foot.  Pain that worsens with standing or walking. CAUSES  Plantar fasciitis is caused by irritation and injury to the plantar fascia on the underneath side of the foot. Common mechanisms of injury include:  Direct trauma to bottom of the foot.  Damage to a small nerve that runs under the foot where the main fascia attaches to the heel bone.  Stress placed on the plantar fascia due to bone spurs. RISK INCREASES WITH:   Activities that place stress on the plantar fascia (running, jumping, pivoting, or cutting).  Poor strength and flexibility.  Improperly fitted shoes.  Tight calf muscles.  Flat feet.  Failure to warm-up properly before activity.  Obesity. PREVENTION  Warm up and stretch properly before activity.  Allow for adequate recovery between workouts.  Maintain physical fitness:  Strength, flexibility, and endurance.  Cardiovascular fitness.  Maintain a health body weight.  Avoid  stress on the plantar fascia.  Wear properly fitted shoes, including arch supports for individuals who have flat feet.  PROGNOSIS  If treated properly, then the symptoms of plantar fasciitis usually resolve without surgery. However, occasionally surgery is necessary.  RELATED COMPLICATIONS   Recurrent symptoms that may result in a chronic condition.  Problems of the lower back that are caused by compensating for the injury, such as limping.  Pain or weakness of the foot during push-off following surgery.  Chronic inflammation, scarring, and partial or complete fascia tear, occurring more often from repeated injections.  TREATMENT  Treatment initially involves the use of ice and medication to help reduce pain and inflammation. The use of strengthening and stretching exercises may help reduce pain with activity, especially stretches of the Achilles tendon. These exercises may be  performed at home or with a therapist. Your caregiver may recommend that you use heel cups of arch supports to help reduce stress on the plantar fascia. Occasionally, corticosteroid injections are given to reduce inflammation. If symptoms persist for greater than 6 months despite non-surgical (conservative), then surgery may be recommended.   MEDICATION   If pain medication is necessary, then nonsteroidal anti-inflammatory medications, such as aspirin and ibuprofen, or other minor pain relievers, such as acetaminophen, are often recommended.  Do not take pain medication within 7 days before surgery.  Prescription pain relievers may be given if deemed necessary by your caregiver. Use only as directed and only as much as you need.  Corticosteroid injections may be given by your caregiver. These injections should be reserved for the most serious cases, because they may only be given a certain number of times.  HEAT AND COLD  Cold treatment (icing) relieves pain and reduces inflammation. Cold treatment should be  applied for 10 to 15 minutes every 2 to 3 hours for inflammation and pain and immediately after any activity that aggravates your symptoms. Use ice packs or massage the area with a piece of ice (ice massage).  Heat treatment may be used prior to performing the stretching and strengthening activities prescribed by your caregiver, physical therapist, or athletic trainer. Use a heat pack or soak the injury in warm water.  SEEK IMMEDIATE MEDICAL CARE IF:  Treatment seems to offer no benefit, or the condition worsens.  Any medications produce adverse side effects.  EXERCISES- RANGE OF MOTION (ROM) AND STRETCHING EXERCISES - Plantar Fasciitis (Heel Spur Syndrome) These exercises may help you when beginning to rehabilitate your injury. Your symptoms may resolve with or without further involvement from your physician, physical therapist or athletic trainer. While completing these exercises, remember:   Restoring tissue flexibility helps normal motion to return to the joints. This allows healthier, less painful movement and activity.  An effective stretch should be held for at least 30 seconds.  A stretch should never be painful. You should only feel a gentle lengthening or release in the stretched tissue.  RANGE OF MOTION - Toe Extension, Flexion  Sit with your right / left leg crossed over your opposite knee.  Grasp your toes and gently pull them back toward the top of your foot. You should feel a stretch on the bottom of your toes and/or foot.  Hold this stretch for 10 seconds.  Now, gently pull your toes toward the bottom of your foot. You should feel a stretch on the top of your toes and or foot.  Hold this stretch for 10 seconds. Repeat  times. Complete this stretch 3 times per day.   RANGE OF MOTION - Ankle Dorsiflexion, Active Assisted  Remove shoes and sit on a chair that is preferably not on a carpeted surface.  Place right / left foot under knee. Extend your opposite leg for  support.  Keeping your heel down, slide your right / left foot back toward the chair until you feel a stretch at your ankle or calf. If you do not feel a stretch, slide your bottom forward to the edge of the chair, while still keeping your heel down.  Hold this stretch for 10 seconds. Repeat 3 times. Complete this stretch 2 times per day.   STRETCH  Gastroc, Standing  Place hands on wall.  Extend right / left leg, keeping the front knee somewhat bent.  Slightly point your toes inward on your back foot.  Keeping your right / left heel on the floor and your knee straight, shift your weight toward the wall, not allowing your back to arch.  You should feel a gentle stretch in the right / left calf. Hold this position for 10 seconds. Repeat 3 times. Complete this stretch 2 times per day.  STRETCH  Soleus, Standing  Place hands on wall.  Extend right / left leg, keeping the other knee somewhat bent.  Slightly point your toes inward on your back foot.  Keep your right / left heel on the floor, bend your back knee, and slightly shift your weight over the back leg so that you feel a gentle stretch deep in your back calf.  Hold this position for 10 seconds. Repeat 3 times. Complete this stretch 2 times per day.  STRETCH  Gastrocsoleus, Standing  Note: This exercise can place a lot of stress on your foot and ankle. Please complete this exercise only if specifically instructed by your caregiver.   Place the ball of your right / left foot on a step, keeping your other foot firmly on the same step.  Hold on to the wall or a rail for balance.  Slowly lift your other foot, allowing your body weight to press your heel down over the edge of the step.  You should feel a stretch in your right / left calf.  Hold this position for 10 seconds.  Repeat this exercise with a slight bend in your right / left knee. Repeat 3 times. Complete this stretch 2 times per day.   STRENGTHENING EXERCISES  - Plantar Fasciitis (Heel Spur Syndrome)  These exercises may help you when beginning to rehabilitate your injury. They may resolve your symptoms with or without further involvement from your physician, physical therapist or athletic trainer. While completing these exercises, remember:   Muscles can gain both the endurance and the strength needed for everyday activities through controlled exercises.  Complete these exercises as instructed by your physician, physical therapist or athletic trainer. Progress the resistance and repetitions only as guided.  STRENGTH - Towel Curls  Sit in a chair positioned on a non-carpeted surface.  Place your foot on a towel, keeping your heel on the floor.  Pull the towel toward your heel by only curling your toes. Keep your heel on the floor. Repeat 3 times. Complete this exercise 2 times per day.  STRENGTH - Ankle Inversion  Secure one end of a rubber exercise band/tubing to a fixed object (table, pole). Loop the other end around your foot just before your toes.  Place your fists between your knees. This will focus your strengthening at your ankle.  Slowly, pull your big toe up and in, making sure the band/tubing is positioned to resist the entire motion.  Hold this position for 10 seconds.  Have your muscles resist the band/tubing as it slowly pulls your foot back to the starting position. Repeat 3 times. Complete this exercises 2 times per day.  Document Released: 12/14/2005 Document Revised: 03/07/2012 Document Reviewed: 03/28/2009 [facility] Patient Information 2014 [place], [place].  Pregabalin capsules What is this medicine? PREGABALIN (pre GAB a lin) is used to treat nerve pain from diabetes, shingles, spinal cord injury, and fibromyalgia. It is also used to control seizures in epilepsy. This medicine may be used for other purposes; ask your health care provider or pharmacist if you have questions. COMMON BRAND NAME(S): Lyrica What should I  tell my health care provider before I take this medicine? They need  to know if you have any of these conditions: -bleeding problems -heart disease, including heart failure -history of alcohol or drug abuse -kidney disease -suicidal thoughts, plans, or attempt; a previous suicide attempt by you or a family member -an unusual or allergic reaction to pregabalin, gabapentin, other medicines, foods, dyes, or preservatives -pregnant or trying to get pregnant or trying to conceive with your partner -breast-feeding How should I use this medicine? Take this medicine by mouth with a glass of water. Follow the directions on the prescription label. You can take it with or without food. If it upsets your stomach, take it with food. Take your medicine at regular intervals. Do not take it more often than directed. Do not stop taking except on your doctor's advice. A special MedGuide will be given to you by the pharmacist with each prescription and refill. Be sure to read this information carefully each time. Talk to your pediatrician regarding the use of this medicine in children. While this drug may be prescribed for children as young as 1 month for selected conditions, precautions do apply. Overdosage: If you think you have taken too much of this medicine contact a poison control center or emergency room at once. NOTE: This medicine is only for you. Do not share this medicine with others. What if I miss a dose? If you miss a dose, take it as soon as you can. If it is almost time for your next dose, take only that dose. Do not take double or extra doses. What may interact with this medicine? -alcohol -certain medicines for blood pressure like captopril, enalapril, or lisinopril -certain medicines for diabetes, like pioglitazone or rosiglitazone -certain medicines for anxiety or sleep -narcotic medicines for pain This list may not describe all possible interactions. Give your health care provider a list of  all the medicines, herbs, non-prescription drugs, or dietary supplements you use. Also tell them if you smoke, drink alcohol, or use illegal drugs. Some items may interact with your medicine. What should I watch for while using this medicine? Tell your doctor or healthcare professional if your symptoms do not start to get better or if they get worse. Visit your doctor or health care professional for regular checks on your progress. Do not stop taking except on your doctor's advice. You may develop a severe reaction. Your doctor will tell you how much medicine to take. Wear a medical identification bracelet or chain if you are taking this medicine for seizures, and carry a card that describes your disease and details of your medicine and dosage times. You may get drowsy or dizzy. Do not drive, use machinery, or do anything that needs mental alertness until you know how this medicine affects you. Do not stand or sit up quickly, especially if you are an older patient. This reduces the risk of dizzy or fainting spells. Alcohol may interfere with the effect of this medicine. Avoid alcoholic drinks. If you have a heart condition, like congestive heart failure, and notice that you are retaining water and have swelling in your hands or feet, contact your health care provider immediately. The use of this medicine may increase the chance of suicidal thoughts or actions. Pay special attention to how you are responding while on this medicine. Any worsening of mood, or thoughts of suicide or dying should be reported to your health care professional right away. This medicine has caused reduced sperm counts in some men. This may interfere with the ability to father a child. You should  talk to your doctor or health care professional if you are concerned about your fertility. Women who become pregnant while using this medicine for seizures may enroll in the [place] Pregnancy Registry by calling  ([phone]. This registry collects information about the safety of antiepileptic drug use during pregnancy. What side effects may I notice from receiving this medicine? Side effects that you should report to your doctor or health care professional as soon as possible: -allergic reactions like skin rash, itching or hives, swelling of the face, lips, or tongue -breathing problems -changes in vision -chest pain -confusion -jerking or unusual movements of any part of your body -loss of memory -muscle pain, tenderness, or weakness -suicidal thoughts or other mood changes -swelling of the ankles, feet, hands -unusual bruising or bleeding Side effects that usually do not require medical attention (report to your doctor or health care professional if they continue or are bothersome): -dizziness -drowsiness -dry mouth -headache -nausea -tremors -trouble sleeping -weight gain This list may not describe all possible side effects. Call your doctor for medical advice about side effects. You may report side effects to FDA at 1-800-FDA-1088. Where should I keep my medicine? Keep out of the reach of children. This medicine can be abused. Keep your medicine in a safe place to protect it from theft. Do not share this medicine with anyone. Selling or giving away this medicine is dangerous and against the law. This medicine may cause accidental overdose and death if it taken by other adults, children, or pets. Mix any unused medicine with a substance like cat litter or coffee grounds. Then throw the medicine away in a sealed container like a sealed bag or a coffee can with a lid. Do not use the medicine after the expiration date. Store at room temperature between 15 and 30 degrees C (59 and 86 degrees F). NOTE: This sheet is a summary. It may not cover all possible information. If you have questions about this medicine, talk to your doctor, pharmacist, or health care provider.  2019 Elsevier/Gold  Standard (2018-05-25 10:23:36)

## 2019-02-28 DIAGNOSIS — M722 Plantar fascial fibromatosis: Secondary | ICD-10-CM | POA: Insufficient documentation

## 2019-02-28 DIAGNOSIS — E1149 Type 2 diabetes mellitus with other diabetic neurological complication: Secondary | ICD-10-CM

## 2019-02-28 HISTORY — DX: Type 2 diabetes mellitus with other diabetic neurological complication: E11.49

## 2019-02-28 HISTORY — DX: Plantar fascial fibromatosis: M72.2

## 2019-03-16 ENCOUNTER — Encounter: Payer: Self-pay | Admitting: *Deleted

## 2019-03-17 ENCOUNTER — Encounter: Payer: Self-pay | Admitting: Cardiology

## 2019-03-20 ENCOUNTER — Ambulatory Visit (INDEPENDENT_AMBULATORY_CARE_PROVIDER_SITE_OTHER): Payer: PRIVATE HEALTH INSURANCE | Admitting: Podiatry

## 2019-03-20 ENCOUNTER — Other Ambulatory Visit: Payer: Self-pay

## 2019-03-20 DIAGNOSIS — M722 Plantar fascial fibromatosis: Secondary | ICD-10-CM | POA: Diagnosis not present

## 2019-03-20 DIAGNOSIS — E1149 Type 2 diabetes mellitus with other diabetic neurological complication: Secondary | ICD-10-CM | POA: Diagnosis not present

## 2019-03-20 MED ORDER — TRIAMCINOLONE ACETONIDE 10 MG/ML IJ SUSP
10.0000 mg | Freq: Once | INTRAMUSCULAR | Status: AC
  Administered 2019-03-20: 10 mg

## 2019-03-20 NOTE — Progress Notes (Signed)
Subjective: 58 year old male presents the office today for follow-up evaluation of bilateral heel pain, plantar fasciitis as well as neuropathy. He states that overall he is doing better. He states the steroid injection did help.  The plantar fascial brace did break on 1 of them.  He also get new inserts which is been helpful.  The Lyrica is also been helpful and is been tolerating well with any side effects. Denies any systemic complaints such as fevers, chills, nausea, vomiting. No acute changes since last appointment, and no other complaints at this time.   Objective: AAO x3, NAD DP/PT pulses palpable bilaterally, CRT less than 3 seconds There is improved but continued tenderness palpation along the plantar medial tubercle of the calcaneus insertion plantar fascia bilaterally.  Plantar fascial peers to be intact.  No pain with lateral compression of calcaneus.  No pain on the Achilles tendon. No open lesions or pre-ulcerative lesions.  No pain with calf compression, swelling, warmth, erythema  Assessment: Bilateral heel pain, plantar fasciitis, neuropathy  Plan: -All treatment options discussed with the patient including all alternatives, risks, complications.  -Would continue the current dose of Lyrica we discussed increasing if needed. -Second steroid injections performed bilateral heels.  See procedure note below.  Continue with stretching, icing to this daily.  I did give him a new plantar fascial brace as his other one broke -Patient encouraged to call the office with any questions, concerns, change in symptoms.   Procedure: Injection Tendon/Ligament Discussed alternatives, risks, complications and verbal consent was obtained.  Location: Bilateral plantar fascia at the glabrous junction; medial approach. Skin Prep:Alcohol  Injectate: 0.5cc 0.5% marcaine plain, 0.5 cc 2% lidocaine plain and, 1 cc kenalog 10. Disposition: Patient tolerated procedure well. Injection site dressed with a  band-aid.  Post-injection care was discussed and return precautions discussed.   Return in about 4 weeks (around 04/17/2019).  Trula Slade DPM

## 2019-03-20 NOTE — Patient Instructions (Signed)

## 2019-03-24 ENCOUNTER — Telehealth: Payer: Self-pay | Admitting: Cardiology

## 2019-03-24 NOTE — Telephone Encounter (Signed)
New message  Patient is returning call to speak to [facility].

## 2019-03-24 NOTE — Telephone Encounter (Signed)
Pt will download webex app over the weekend. Pt aware webex visit is scheduled for Monday.

## 2019-03-27 ENCOUNTER — Telehealth (INDEPENDENT_AMBULATORY_CARE_PROVIDER_SITE_OTHER): Payer: No Typology Code available for payment source | Admitting: Cardiology

## 2019-03-27 ENCOUNTER — Other Ambulatory Visit: Payer: Self-pay

## 2019-03-27 DIAGNOSIS — I480 Paroxysmal atrial fibrillation: Secondary | ICD-10-CM | POA: Diagnosis not present

## 2019-03-27 DIAGNOSIS — I10 Essential (primary) hypertension: Secondary | ICD-10-CM | POA: Diagnosis not present

## 2019-03-27 DIAGNOSIS — G4733 Obstructive sleep apnea (adult) (pediatric): Secondary | ICD-10-CM

## 2019-03-27 DIAGNOSIS — Z7901 Long term (current) use of anticoagulants: Secondary | ICD-10-CM

## 2019-03-27 NOTE — Patient Instructions (Signed)
Medication Instructions:  Your physician has recommended you make the following change in your medication:  1. STOP Metoprolol  * If you need a refill on your cardiac medications before your next appointment, please call your pharmacy.   Labwork: None ordered  Testing/Procedures: None ordered  Follow-Up: Your physician recommends that you schedule a follow-up appointment in: 3 months with Dr. Curt Bears.  The office will contact your to arrange this at a later date.  Thank you for choosing [facility]!!   Trinidad Curet, RN [phone]

## 2019-03-27 NOTE — Progress Notes (Signed)
Electrophysiology TeleHealth Note   Due to national recommendations of social distancing due to COVID 19, an audio/video telehealth visit is felt to be most appropriate for this patient at this time.  See MyChart message from today for the patient's consent to telehealth for [facility].   Date:  03/27/2019   ID:  Gary Grimes, DOB 06-22-1961, MRN [id number]  Location: patient's home  Provider location: [address] [place]  Evaluation Performed: Follow-up visit  PCP:  Shirline Frees, MD  Cardiologist:   Revankar Electrophysiologist:  Dr Curt Bears  Chief Complaint: Atrial fibrillation  History of Present Illness:    Gary Grimes is a 58 y.o. male who presents via audio/video conferencing for a telehealth visit today.  Since last being seen in our clinic, the patient reports doing very well.  Today, he denies symptoms of palpitations, chest pain, shortness of breath,  lower extremity edema, dizziness, presyncope, or syncope.  The patient is otherwise without complaint today.  The patient denies symptoms of fevers, chills, cough, or new SOB worrisome for COVID 19.  Today, denies symptoms of palpitations, chest pain, shortness of breath, orthopnea, PND, lower extremity edema, claudication, dizziness, presyncope, syncope, bleeding, or neurologic sequela. The patient is tolerating medications without difficulties.  Since last being seen he is done well.  He has had no chest pain or shortness of breath.  Is unaware of any further episodes of atrial fibrillation.  Past Medical History:  Diagnosis Date  . Anginal pain ([place])    ER visit 01/14/2014 visit on chart   . Anxiety    pt denies  . Arthritis    'q inch of my body" (10/28/2018)  . Bipolar disorder ([place])   . Chronic lower back pain   . Depression    did get seen in er 4/13 for evaluation-psyc  . GERD (gastroesophageal reflux disease)   . Headache    "bad ones lately; weekly" (10/28/2018)  . High  cholesterol   . High triglycerides   . Hypertension   . Levator syndrome 2001   history   . OSA on CPAP   . Panic attacks   . Perirectal abscess   . S/P ablation of atrial fibrillation   . Type II diabetes mellitus ([place])     Past Surgical History:  Procedure Laterality Date  . ANAL FISSURE REPAIR  08/05/2000   proctoscopy  . APPENDECTOMY  1984  . ATRIAL FIBRILLATION ABLATION  10/28/2018  . ATRIAL FIBRILLATION ABLATION N/A 10/28/2018   Procedure: ATRIAL FIBRILLATION ABLATION;  Surgeon: Constance Haw, MD;  Location: [facility];  Service: Cardiovascular;  Laterality: N/A;  . COLONOSCOPY    . HERNIA REPAIR    . INSERTION OF MESH N/A 01/29/2015   Procedure: INSERTION OF MESH;  Surgeon: Excell Seltzer, MD;  Location: [facility];  Service: General;  Laterality: N/A;  . IRRIGATION AND DEBRIDEMENT ABSCESS  02/18/2012   peri-rectal  . LEFT HEART CATH AND CORONARY ANGIOGRAPHY N/A 06/08/2018   Procedure: LEFT HEART CATH AND CORONARY ANGIOGRAPHY;  Surgeon: Leonie Man, MD;  Location: [facility];  Service: Cardiovascular;  Laterality: N/A;  . SHOULDER ARTHROSCOPY Left ?2009   "repaired  AC joint; reattached bicept tendon"  . SHOULDER ARTHROSCOPY W/ LABRAL REPAIR Left 08/08/2007  . UMBILICAL HERNIA REPAIR  10/27/2010  . VENTRAL HERNIA REPAIR N/A 01/29/2015   Procedure: LAPAROSCOPIC VENTRAL HERNIA;  Surgeon: Excell Seltzer, MD;  Location: [facility];  Service: General;  Laterality:  N/A;    Current Outpatient Medications  Medication Sig Dispense Refill  . albuterol (PROVENTIL HFA;VENTOLIN HFA) 108 (90 Base) MCG/ACT inhaler Inhale 2 puffs into the lungs every 6 (six) hours as needed.    Marland Kitchen apixaban (ELIQUIS) 5 MG TABS tablet Take 5 mg by mouth 2 (two) times daily.    . Ascorbic Acid (VITAMIN C) 1000 MG tablet Take 3,000 mg by mouth daily.    . busPIRone (BUSPAR) 5 MG tablet TAKE 1 Tablet BY MOUTH EVERY MORNING AND TAKE 1 Tablet BY MOUTH EVERY EVENING    . diclofenac sodium  (VOLTAREN) 1 % GEL Apply 2 g topically 4 (four) times daily. Rub into affected area of foot 2 to 4 times daily 100 g 2  . glimepiride (AMARYL) 2 MG tablet Take 2 mg by mouth 2 (two) times daily.     . insulin lispro (HUMALOG) 100 UNIT/ML injection Inject 2-10 Units into the skin 3 (three) times daily as needed for high blood sugar.     . lithium carbonate (LITHOBID) 300 MG CR tablet Take 900 mg by mouth at bedtime.   2  . LORazepam (ATIVAN) 1 MG tablet Take 1 tablet by mouth at bedtime.   5  . losartan (COZAAR) 100 MG tablet Take 1 tablet (100 mg total) by mouth every evening. (Patient taking differently: Take 100 mg by mouth daily. )    . metoprolol tartrate (LOPRESSOR) 25 MG tablet Take 1 tablet (25 mg total) by mouth 2 (two) times daily with a meal. 180 tablet 2  . nitroGLYCERIN (NITROSTAT) 0.4 MG SL tablet Place 1 tablet (0.4 mg total) under the tongue every 5 (five) minutes as needed for up to 25 days for chest pain. 25 tablet 11  . omeprazole (PRILOSEC) 40 MG capsule Take 40 mg by mouth daily as needed (HEARTBURN).     . pioglitazone (ACTOS) 30 MG tablet Take 30 mg by mouth every morning.     . pregabalin (LYRICA) 75 MG capsule Take 1 capsule (75 mg total) by mouth 2 (two) times daily. 60 capsule 2  . risperiDONE (RISPERDAL) 2 MG tablet Take 2 mg by mouth at bedtime.    . sertraline (ZOLOFT) 50 MG tablet Take 50 mg by mouth at bedtime.   2  . topiramate (TOPAMAX) 25 MG tablet Take 100 mg by mouth at bedtime.  1  . traZODone (DESYREL) 100 MG tablet Take 1 tablet (100 mg total) by mouth at bedtime as needed for sleep. (Patient taking differently: Take 100 mg by mouth at bedtime. ) 30 tablet 0   No current facility-administered medications for this visit.     Allergies:   Morphine   Social History:  The patient  reports that he quit smoking about 41 years ago. His smoking use included cigarettes. He has a 0.50 pack-year smoking history. He has never used smokeless tobacco. He reports that he  does not drink alcohol or use drugs.   Family History:  The patient's  family history includes Breast cancer in his mother; Ovarian cancer in his mother.   ROS:  Please see the history of present illness.   All other systems are personally reviewed and negative.    Exam:    Vital Signs:  There were no vitals taken for this visit.  Over the phone in no acute distress.  No shortness of breath.   Labs/Other Tests and Data Reviewed:    Recent Labs: 10/10/2018: Hemoglobin 14.3; Platelets 296 10/28/2018: BUN 18; Creatinine, Ser  1.25; Potassium 3.9; Sodium 139   Wt Readings from Last 3 Encounters:  12/19/18 298 lb 4 oz (135.3 kg)  10/29/18 298 lb 3.2 oz (135.3 kg)  10/10/18 296 lb (134.3 kg)     Other studies personally reviewed: Additional studies/ records that were reviewed today include: ECG 12/19/18 personally reviewed  Review of the above records today demonstrates: as above SR, rate 88   ASSESSMENT & PLAN:    1.  Paroxysmal atrial fibrillation. Currently on eliquis.  Currently, he is feeling well.  He has no chest pain or shortness of breath.  He is noted no further episodes of atrial fibrillation.  He is currently on metoprolol which he wishes to stop.  This patients CHA2DS2-VASc Score and unadjusted Ischemic Stroke Rate (% per year) is equal to 2.2 % stroke rate/year from a score of 2  Above score calculated as 1 point each if present [CHF, HTN, DM, Vascular=MI/PAD/Aortic Plaque, Age if 65-74, or Male] Above score calculated as 2 points each if present [Age > 75, or Stroke/TIA/TE]    2. Hypertension: Currently well controlled on most recent recordings.  He has not checked his blood pressure recently.  No changes.  3. OSA: encouraged CPAP compliance  4. Morbid obesity: diet and weight loss encouraged.  COVID 19 screen The patient denies symptoms of COVID 19 at this time.  The importance of social distancing was discussed today.  Follow-up: 3 months  Current  medicines are reviewed at length with the patient today.   The patient does not have concerns regarding his medicines.  The following changes were made today: Stop metoprolol  Labs/ tests ordered today include:  No orders of the defined types were placed in this encounter.    Patient Risk:  after full review of this patients clinical status, I feel that they are at moderate risk at this time.  Today, I have spent 13 minutes with the patient with telehealth technology discussing atrial fibrillation.    Signed, Berish Bohman Meredith Leeds, MD  03/27/2019 11:04 AM     [facility] [address] [place] [address] [phone] (office) [phone] (fax)

## 2019-04-05 ENCOUNTER — Encounter (HOSPITAL_COMMUNITY): Payer: Self-pay

## 2019-04-05 ENCOUNTER — Other Ambulatory Visit: Payer: Self-pay

## 2019-04-05 ENCOUNTER — Emergency Department (HOSPITAL_COMMUNITY)
Admission: EM | Admit: 2019-04-05 | Discharge: 2019-04-05 | Disposition: A | Discharge status: Home / Self Care | Payer: No Typology Code available for payment source | Attending: Emergency Medicine | Admitting: Emergency Medicine

## 2019-04-05 DIAGNOSIS — Z87891 Personal history of nicotine dependence: Secondary | ICD-10-CM | POA: Diagnosis not present

## 2019-04-05 DIAGNOSIS — Z79899 Other long term (current) drug therapy: Secondary | ICD-10-CM | POA: Insufficient documentation

## 2019-04-05 DIAGNOSIS — E119 Type 2 diabetes mellitus without complications: Secondary | ICD-10-CM | POA: Diagnosis not present

## 2019-04-05 DIAGNOSIS — R55 Syncope and collapse: Secondary | ICD-10-CM | POA: Insufficient documentation

## 2019-04-05 DIAGNOSIS — I10 Essential (primary) hypertension: Secondary | ICD-10-CM | POA: Diagnosis not present

## 2019-04-05 DIAGNOSIS — Z794 Long term (current) use of insulin: Secondary | ICD-10-CM | POA: Insufficient documentation

## 2019-04-05 LAB — CBC WITH DIFFERENTIAL/PLATELET
Abs Immature Granulocytes: 0.09 10*3/uL — ABNORMAL HIGH (ref 0.00–0.07)
Basophils Absolute: 0 10*3/uL (ref 0.0–0.1)
Basophils Relative: 0 %
Eosinophils Absolute: 0.2 10*3/uL (ref 0.0–0.5)
Eosinophils Relative: 2 %
HCT: 42.5 % (ref 39.0–52.0)
Hemoglobin: 13.7 g/dL (ref 13.0–17.0)
Immature Granulocytes: 1 %
Lymphocytes Relative: 34 %
Lymphs Abs: 2.8 10*3/uL (ref 0.7–4.0)
MCH: 31.6 pg (ref 26.0–34.0)
MCHC: 32.2 g/dL (ref 30.0–36.0)
MCV: 98.2 fL (ref 80.0–100.0)
Monocytes Absolute: 0.8 10*3/uL (ref 0.1–1.0)
Monocytes Relative: 10 %
Neutro Abs: 4.5 10*3/uL (ref 1.7–7.7)
Neutrophils Relative %: 53 %
Platelets: 288 10*3/uL (ref 150–400)
RBC: 4.33 MIL/uL (ref 4.22–5.81)
RDW: 14.1 % (ref 11.5–15.5)
WBC: 8.4 10*3/uL (ref 4.0–10.5)
nRBC: 0 % (ref 0.0–0.2)

## 2019-04-05 LAB — COMPREHENSIVE METABOLIC PANEL
ALT: 22 U/L (ref 0–44)
AST: 21 U/L (ref 15–41)
Albumin: 4 g/dL (ref 3.5–5.0)
Alkaline Phosphatase: 59 U/L (ref 38–126)
Anion gap: 6 (ref 5–15)
BUN: 20 mg/dL (ref 6–20)
CO2: 26 mmol/L (ref 22–32)
Calcium: 9.1 mg/dL (ref 8.9–10.3)
Chloride: 108 mmol/L (ref 98–111)
Creatinine, Ser: 1.1 mg/dL (ref 0.61–1.24)
GFR calc Af Amer: >60 mL/min (ref >60)
GFR calc non Af Amer: >60 mL/min (ref >60)
Glucose, Bld: 111 mg/dL — ABNORMAL HIGH (ref 70–99)
Potassium: 4 mmol/L (ref 3.5–5.1)
Sodium: 140 mmol/L (ref 135–145)
Total Bilirubin: 0.7 mg/dL (ref 0.3–1.2)
Total Protein: 6.9 g/dL (ref 6.5–8.1)

## 2019-04-05 LAB — URINALYSIS, ROUTINE W REFLEX MICROSCOPIC
Bilirubin Urine: NEGATIVE
Glucose, UA: NEGATIVE mg/dL
Hgb urine dipstick: NEGATIVE
Ketones, ur: NEGATIVE mg/dL
Leukocytes,Ua: NEGATIVE
Nitrite: NEGATIVE
Protein, ur: NEGATIVE mg/dL
Specific Gravity, Urine: 1.023 (ref 1.005–1.030)
pH: 5 (ref 5.0–8.0)

## 2019-04-05 LAB — CBG MONITORING, ED: Glucose-Capillary: 103 mg/dL — ABNORMAL HIGH (ref 70–99)

## 2019-04-05 NOTE — ED Triage Notes (Addendum)
Pt reports syncopal episode after getting out of the shower this am, with loss of consciousness. Pt reports generalized weakness and fatigue x3 days. Denies cough, fever, chest pain or SHOB.

## 2019-04-05 NOTE — ED Provider Notes (Signed)
[facility] DEPT Provider Note   CSN: [id number] Arrival date & time: 04/05/19  1240    History   Chief Complaint Chief Complaint  Patient presents with  . Loss of Consciousness    HPI Gary Grimes is a 58 y.o. male.     The history is provided by the patient and medical records. No language interpreter was used.  Loss of Consciousness     58 year old male with history of IDDM, bipolar, chronic back pain is, paroxysmal atrial fibrillation status post ablation presenting for evaluation of a syncopal episode.  Patient report for the past week he has been feeling fatigued.  States that he does not have much energy, feeling lightheadedness, and dizzy and today while showering he had an apparent syncopal episode.  He states that he slumped down on the top but did not injure himself.  He reached out to his PCP who recommend patient to come to ER for further evaluation.  Patient otherwise denies any recent sickness, no fever, chills, chest headache, confusion, neck pain, chest pain, trouble breathing, abdominal pain, back pain, nausea vomiting diarrhea or dysuria.  He denies any focal numbness or focal weakness.  He did recall that his blood sugar was in the 300s several weeks prior and his daughter recently changed his diabetic medication.  He is now taking a insulin 70/30 for the past week.  He also increase his supplementation and testosterone replacement.  He denies any recent sick contact with anyone that has COVID-19.  Denies any recent travel.  Past Medical History:  Diagnosis Date  . Anginal pain ([place])    ER visit 01/14/2014 visit on chart   . Anxiety    pt denies  . Arthritis    'q inch of my body" (10/28/2018)  . Bipolar disorder ([place])   . Chronic lower back pain   . Depression    did get seen in er 4/13 for evaluation-psyc  . GERD (gastroesophageal reflux disease)   . Headache    "bad ones lately; weekly" (10/28/2018)  . High cholesterol   .  High triglycerides   . Hypertension   . Levator syndrome 2001   history   . OSA on CPAP   . Panic attacks   . Perirectal abscess   . S/P ablation of atrial fibrillation   . Type II diabetes mellitus [facility])     Patient Active Problem List   Diagnosis Date Noted  . Plantar fasciitis 02/28/2019  . Type II diabetes mellitus with neurological manifestations ([place]) 02/28/2019  . S/P ablation of atrial fibrillation   . Paroxysmal atrial fibrillation ([place]) 06/06/2018  . Essential hypertension 06/06/2018  . Diabetes mellitus due to underlying condition with unspecified complications ([place]) 12/87/8676  . Sleep apnea 06/06/2018  . Overweight 06/06/2018  . Bipolar disorder, manic ([place]) 05/29/2016  . MDD (major depressive disorder), recurrent severe, without psychosis ([place]) 12/27/2015  . Ventral incisional hernia 01/29/2015  . Perirectal abscess 02/18/2012  . SPONDYLOSIS 09/19/2008    Past Surgical History:  Procedure Laterality Date  . ANAL FISSURE REPAIR  08/05/2000   proctoscopy  . APPENDECTOMY  1984  . ATRIAL FIBRILLATION ABLATION  10/28/2018  . ATRIAL FIBRILLATION ABLATION N/A 10/28/2018   Procedure: ATRIAL FIBRILLATION ABLATION;  Surgeon: Constance Haw, MD;  Location: [facility];  Service: Cardiovascular;  Laterality: N/A;  . COLONOSCOPY    . HERNIA REPAIR    . INSERTION OF MESH N/A 01/29/2015   Procedure: INSERTION OF MESH;  Surgeon: Excell Seltzer, MD;  [facility];  Service: General;  Laterality: N/A;  . IRRIGATION AND DEBRIDEMENT ABSCESS  02/18/2012   peri-rectal  . LEFT HEART CATH AND CORONARY ANGIOGRAPHY N/A 06/08/2018   Procedure: LEFT HEART CATH AND CORONARY ANGIOGRAPHY;  Surgeon: Leonie Man, MD;  Location: [facility];  Service: Cardiovascular;  Laterality: N/A;  . SHOULDER ARTHROSCOPY Left ?2009   "repaired  AC joint; reattached bicept tendon"  . SHOULDER ARTHROSCOPY W/ LABRAL REPAIR Left 08/08/2007  . UMBILICAL HERNIA REPAIR  10/27/2010  .  VENTRAL HERNIA REPAIR N/A 01/29/2015   Procedure: LAPAROSCOPIC VENTRAL HERNIA;  Surgeon: Excell Seltzer, MD;  Location: [facility];  Service: General;  Laterality: N/A;        Home Medications    Prior to Admission medications   Medication Sig Start Date End Date Taking? Authorizing Provider  albuterol (PROVENTIL HFA;VENTOLIN HFA) 108 (90 Base) MCG/ACT inhaler Inhale 2 puffs into the lungs every 6 (six) hours as needed. 01/07/19   [provider]  apixaban (ELIQUIS) 5 MG TABS tablet Take 5 mg by mouth 2 (two) times daily.    [provider]  Ascorbic Acid (VITAMIN C) 1000 MG tablet Take 3,000 mg by mouth daily.    [provider]  busPIRone (BUSPAR) 5 MG tablet TAKE 1 Tablet BY MOUTH EVERY MORNING AND TAKE 1 Tablet BY MOUTH EVERY EVENING 01/09/19   [provider]  diclofenac sodium (VOLTAREN) 1 % GEL Apply 2 g topically 4 (four) times daily. Rub into affected area of foot 2 to 4 times daily 02/27/19   Trula Slade, DPM  glimepiride (AMARYL) 2 MG tablet Take 2 mg by mouth 2 (two) times daily.     [provider]  insulin lispro (HUMALOG) 100 UNIT/ML injection Inject 2-10 Units into the skin 3 (three) times daily as needed for high blood sugar.  06/07/18   Revankar, Reita Cliche, MD  lithium carbonate (LITHOBID) 300 MG CR tablet Take 900 mg by mouth at bedtime.  05/31/18   [provider]  LORazepam (ATIVAN) 1 MG tablet Take 1 tablet by mouth at bedtime.  05/24/18   [provider]  losartan (COZAAR) 100 MG tablet Take 1 tablet (100 mg total) by mouth every evening. Patient taking differently: Take 100 mg by mouth daily.  01/06/16   Niel Hummer, NP  metoprolol tartrate (LOPRESSOR) 25 MG tablet Take 1 tablet (25 mg total) by mouth 2 (two) times daily with a meal. 12/19/18   Sherran Needs, NP  nitroGLYCERIN (NITROSTAT) 0.4 MG SL tablet Place 1 tablet (0.4 mg total) under the tongue every 5 (five) minutes as needed for up to 25 days for  chest pain. 06/06/18   Revankar, Reita Cliche, MD  omeprazole (PRILOSEC) 40 MG capsule Take 40 mg by mouth daily as needed (HEARTBURN).  12/29/16   [provider]  pioglitazone (ACTOS) 30 MG tablet Take 30 mg by mouth every morning.  06/07/18   Revankar, Reita Cliche, MD  pregabalin (LYRICA) 75 MG capsule Take 1 capsule (75 mg total) by mouth 2 (two) times daily. 02/27/19   Trula Slade, DPM  risperiDONE (RISPERDAL) 2 MG tablet Take 2 mg by mouth at bedtime.    [provider]  sertraline (ZOLOFT) 50 MG tablet Take 50 mg by mouth at bedtime.  05/31/18   [provider]  topiramate (TOPAMAX) 25 MG tablet Take 100 mg by mouth at bedtime. 09/28/18   [provider]  traZODone (DESYREL) 100 MG tablet Take 1 tablet (100 mg total) by mouth at bedtime as needed for sleep. Patient taking differently: Take 100 mg by mouth at bedtime.  01/08/16   Kerrie Buffalo, NP    Family History Family History  Problem Relation Age of Onset  . Breast cancer Mother   . Ovarian cancer Mother     Social History Social History   Tobacco Use  . Smoking status: Former Smoker    Packs/day: 0.50    Years: 1.00    Pack years: 0.50    Types: Cigarettes    Last attempt to quit: 07/06/1977    Years since quitting: 41.7  . Smokeless tobacco: Never Used  Substance Use Topics  . 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 Drug use: Never     Allergies   Morphine   Review of Systems Review of Systems  Cardiovascular: Positive for syncope.  All other systems reviewed and are negative.    Physical Exam Updated Vital Signs BP (!) 152/81 (BP Location: Left Arm)   Pulse 63   Temp 98.5 F (36.9 C) (Oral)   Resp 15   SpO2 100%   Physical Exam Vitals signs and nursing note reviewed.  Constitutional:      General: He is not in acute distress.    Appearance: He is well-developed.  HENT:     Head: Normocephalic and atraumatic.   Eyes:     Conjunctiva/sclera: Conjunctivae normal.  Neck:     Musculoskeletal: Normal range of motion and neck supple.  Cardiovascular:     Rate and Rhythm: Normal rate and regular rhythm.     Pulses: Normal pulses.     Heart sounds: Normal heart sounds.  Pulmonary:     Effort: Pulmonary effort is normal.     Breath sounds: Normal breath sounds.  Abdominal:     Palpations: Abdomen is soft.     Tenderness: There is no abdominal tenderness.  Musculoskeletal:     Comments: 5 out of 5 strength to all 4 extremities with intact distal pulses.  Skin:    Findings: No rash.  Neurological:     Mental Status: He is alert and oriented to person, place, and time.     GCS: GCS eye subscore is 4. GCS verbal subscore is 5. GCS motor subscore is 6.     Cranial Nerves: Cranial nerves are intact.     Motor: Weakness present.     Coordination: Coordination is intact.     Gait: Gait is intact.      ED Treatments / Results  Labs (all labs ordered are listed, but only abnormal results are displayed) Labs Reviewed  CBC WITH DIFFERENTIAL/PLATELET - Abnormal; Notable for the following components:      Result Value   Abs Immature Granulocytes 0.09 (*)    All other components within normal limits  COMPREHENSIVE METABOLIC PANEL - Abnormal; Notable for the following components:   Glucose, Bld 111 (*)    All other components within normal limits  URINALYSIS, ROUTINE W REFLEX MICROSCOPIC - Abnormal; Notable for the following components:   Color, Urine AMBER (*)    APPearance HAZY (*)    All other components within normal limits  CBG MONITORING, ED - Abnormal; Notable for the following components:   Glucose-Capillary 103 (*)    All other components within normal limits    EKG EKG Interpretation  Date/Time:  Wednesday April 05 2019  12:55:04 EDT Ventricular Rate:  61 PR Interval:    QRS Duration: 97 QT Interval:  432 QTC Calculation: 436 R Axis:   86 Text Interpretation:  Sinus rhythm  Borderline right axis deviation Confirmed by Virgel Manifold [phone]) on 04/05/2019 2:35:20 PM    Date: 04/05/2019  Rate: 61  Rhythm: normal sinus rhythm  QRS Axis: normal  Intervals: normal  ST/T Wave abnormalities: normal  Conduction Disutrbances: none  Narrative Interpretation: borderline RAD  Old EKG Reviewed: No significant changes noted     Radiology No results found.  Procedures Procedures (including critical care time)  Medications Ordered in ED Medications - No data to display   Initial Impression / Assessment and Plan / ED Course  I have reviewed the triage vital signs and the nursing notes.  Pertinent labs & imaging results that were available during my care of the patient were reviewed by me and considered in my medical decision making (see chart for details).        BP (!) 143/69 (BP Location: Right Arm)   Pulse (!) 58   Temp 98.5 F (36.9 C) (Oral)   Resp 20   SpO2 97%    Final Clinical Impressions(s) / ED Diagnoses   Final diagnoses:  Syncope and collapse    ED Discharge Orders    None     1:41 PM Patient here for syncopal episode.  He did report feeling lightheadedness prior to having syncope in the bathroom while showering this morning.  He also endorsed generalized fatigue for nearly a week.  Possible contributing factors include changing his diabetic medication.  No report of alcohol or drug use.  Work-up initiated.  3:12 PM Patient has normal orthostatic vital signs, normal H&H, normal WBC, normal electrolytes panel, CBC unremarkable, UA without signs of urinary tract infection, EKG without concerning arrhythmia.  No evidence of to suggest HCM, no evidence of Brugada syndrome or WPW.  Mild bradycardia on EKG.   I discussed outpt f/u with PCP for further testing including cardiac echo, thyroid level, vitamin D, testosterone, etc.. for his fatigue.  Return precaution given.  Care discussed with DR. Kohut. Pt otherwise felt back to his baseline  and comfortable going home.  Suspect vasovagal syncope.    Domenic Moras, PA-C 04/05/19 1522    Virgel Manifold, MD 04/06/19 [phone]

## 2019-04-05 NOTE — Discharge Instructions (Signed)
Please call and follow up closely with your primary care provider for further evaluation of your recent passing out episode.

## 2019-04-05 NOTE — ED Notes (Signed)
Pt given urinal and made aware of need for urine specimen 

## 2019-04-17 ENCOUNTER — Ambulatory Visit: Payer: PRIVATE HEALTH INSURANCE | Admitting: Podiatry

## 2019-04-27 ENCOUNTER — Other Ambulatory Visit: Payer: Self-pay

## 2019-04-27 DIAGNOSIS — I480 Paroxysmal atrial fibrillation: Secondary | ICD-10-CM

## 2019-04-27 MED ORDER — APIXABAN 5 MG PO TABS: 5.0000 mg | ORAL_TABLET | Freq: Two times a day (BID) | ORAL | 3 refills | Status: DC

## 2019-04-28 DIAGNOSIS — J324 Chronic pansinusitis: Secondary | ICD-10-CM | POA: Insufficient documentation

## 2019-04-28 HISTORY — DX: Chronic pansinusitis: J32.4

## 2019-05-03 ENCOUNTER — Other Ambulatory Visit: Payer: Self-pay | Admitting: *Deleted

## 2019-05-03 ENCOUNTER — Telehealth: Payer: Self-pay | Admitting: Podiatry

## 2019-05-03 ENCOUNTER — Other Ambulatory Visit: Payer: Self-pay | Admitting: Podiatry

## 2019-05-03 MED ORDER — PREGABALIN 75 MG PO CAPS: 75.0000 mg | ORAL_CAPSULE | Freq: Two times a day (BID) | ORAL | 0 refills | Status: DC

## 2019-05-03 MED ORDER — PREGABALIN 75 MG PO CAPS: 75.0000 mg | ORAL_CAPSULE | Freq: Two times a day (BID) | ORAL | 2 refills | Status: DC

## 2019-05-03 NOTE — Progress Notes (Signed)
Refilled Lyrica

## 2019-05-03 NOTE — Telephone Encounter (Signed)
Patient called and left a message stating that he would like a refill of lyrica and per Dr Jacqualyn Posey stated that patient could get a refill of Lyrica 75 mg and 60 tablets and 0 refills and left a message for the patient stating that we filled the medicine and to call the office at [phone]. Lattie Haw

## 2019-05-03 NOTE — Telephone Encounter (Signed)
Request a refill for Lyrica. Uses [facility] [phone].

## 2019-05-03 NOTE — Telephone Encounter (Signed)
Called and left a message for the patient stating that Dr Jacqualyn Posey sent over the medicine (lyrica) to the [facility] pharmacy [phone]. Lattie Haw

## 2019-05-08 ENCOUNTER — Other Ambulatory Visit: Payer: Self-pay | Admitting: Family Medicine

## 2019-05-08 DIAGNOSIS — R1013 Epigastric pain: Secondary | ICD-10-CM

## 2019-05-10 ENCOUNTER — Other Ambulatory Visit: Payer: Self-pay

## 2019-05-10 ENCOUNTER — Encounter (HOSPITAL_BASED_OUTPATIENT_CLINIC_OR_DEPARTMENT_OTHER): Payer: Self-pay | Admitting: *Deleted

## 2019-05-10 ENCOUNTER — Emergency Department (HOSPITAL_BASED_OUTPATIENT_CLINIC_OR_DEPARTMENT_OTHER): Payer: No Typology Code available for payment source

## 2019-05-10 ENCOUNTER — Emergency Department (HOSPITAL_BASED_OUTPATIENT_CLINIC_OR_DEPARTMENT_OTHER)
Admission: EM | Admit: 2019-05-10 | Discharge: 2019-05-10 | Disposition: A | Discharge status: Home / Self Care | Payer: No Typology Code available for payment source | Attending: Family Medicine | Admitting: Family Medicine

## 2019-05-10 DIAGNOSIS — E119 Type 2 diabetes mellitus without complications: Secondary | ICD-10-CM | POA: Diagnosis not present

## 2019-05-10 DIAGNOSIS — Z87891 Personal history of nicotine dependence: Secondary | ICD-10-CM | POA: Insufficient documentation

## 2019-05-10 DIAGNOSIS — R1013 Epigastric pain: Secondary | ICD-10-CM

## 2019-05-10 DIAGNOSIS — Z79899 Other long term (current) drug therapy: Secondary | ICD-10-CM | POA: Diagnosis not present

## 2019-05-10 DIAGNOSIS — B9681 Helicobacter pylori [H. pylori] as the cause of diseases classified elsewhere: Secondary | ICD-10-CM | POA: Diagnosis not present

## 2019-05-10 DIAGNOSIS — A048 Other specified bacterial intestinal infections: Secondary | ICD-10-CM

## 2019-05-10 DIAGNOSIS — Z794 Long term (current) use of insulin: Secondary | ICD-10-CM | POA: Diagnosis not present

## 2019-05-10 DIAGNOSIS — I10 Essential (primary) hypertension: Secondary | ICD-10-CM | POA: Diagnosis not present

## 2019-05-10 DIAGNOSIS — Z7901 Long term (current) use of anticoagulants: Secondary | ICD-10-CM | POA: Diagnosis not present

## 2019-05-10 LAB — URINALYSIS, ROUTINE W REFLEX MICROSCOPIC
Bilirubin Urine: NEGATIVE
Glucose, UA: NEGATIVE mg/dL
Hgb urine dipstick: NEGATIVE
Ketones, ur: NEGATIVE mg/dL
Leukocytes,Ua: NEGATIVE
Nitrite: NEGATIVE
Protein, ur: NEGATIVE mg/dL
Specific Gravity, Urine: 1.02 (ref 1.005–1.030)
pH: 6.5 (ref 5.0–8.0)

## 2019-05-10 LAB — CBC WITH DIFFERENTIAL/PLATELET
Abs Immature Granulocytes: 0.08 10*3/uL — ABNORMAL HIGH (ref 0.00–0.07)
Basophils Absolute: 0 10*3/uL (ref 0.0–0.1)
Basophils Relative: 0 %
Eosinophils Absolute: 0.2 10*3/uL (ref 0.0–0.5)
Eosinophils Relative: 3 %
HCT: 44.6 % (ref 39.0–52.0)
Hemoglobin: 14.6 g/dL (ref 13.0–17.0)
Immature Granulocytes: 1 %
Lymphocytes Relative: 36 %
Lymphs Abs: 3.1 10*3/uL (ref 0.7–4.0)
MCH: 31.7 pg (ref 26.0–34.0)
MCHC: 32.7 g/dL (ref 30.0–36.0)
MCV: 96.7 fL (ref 80.0–100.0)
Monocytes Absolute: 0.9 10*3/uL (ref 0.1–1.0)
Monocytes Relative: 11 %
Neutro Abs: 4.3 10*3/uL (ref 1.7–7.7)
Neutrophils Relative %: 49 %
Platelets: 254 10*3/uL (ref 150–400)
RBC: 4.61 MIL/uL (ref 4.22–5.81)
RDW: 14.1 % (ref 11.5–15.5)
WBC: 8.6 10*3/uL (ref 4.0–10.5)
nRBC: 0 % (ref 0.0–0.2)

## 2019-05-10 LAB — LIPASE, BLOOD: Lipase: 48 U/L (ref 11–51)

## 2019-05-10 LAB — COMPREHENSIVE METABOLIC PANEL
ALT: 32 U/L (ref 0–44)
AST: 29 U/L (ref 15–41)
Albumin: 4.2 g/dL (ref 3.5–5.0)
Alkaline Phosphatase: 52 U/L (ref 38–126)
Anion gap: 6 (ref 5–15)
BUN: 12 mg/dL (ref 6–20)
CO2: 27 mmol/L (ref 22–32)
Calcium: 9.4 mg/dL (ref 8.9–10.3)
Chloride: 105 mmol/L (ref 98–111)
Creatinine, Ser: 1.17 mg/dL (ref 0.61–1.24)
GFR calc Af Amer: >60 mL/min (ref >60)
GFR calc non Af Amer: >60 mL/min (ref >60)
Glucose, Bld: 145 mg/dL — ABNORMAL HIGH (ref 70–99)
Potassium: 4.1 mmol/L (ref 3.5–5.1)
Sodium: 138 mmol/L (ref 135–145)
Total Bilirubin: 0.9 mg/dL (ref 0.3–1.2)
Total Protein: 7 g/dL (ref 6.5–8.1)

## 2019-05-10 MED ORDER — HYDROMORPHONE HCL 1 MG/ML IJ SOLN
0.5000 mg | Freq: Once | INTRAMUSCULAR | Status: AC
  Administered 2019-05-10: 0.5 mg via INTRAVENOUS
  Filled 2019-05-10: qty 1

## 2019-05-10 MED ORDER — DICYCLOMINE HCL 20 MG PO TABS: 20.0000 mg | ORAL_TABLET | Freq: Two times a day (BID) | ORAL | 0 refills | Status: DC

## 2019-05-10 MED ORDER — SODIUM CHLORIDE 0.9 % IV BOLUS
1000.0000 mL | Freq: Once | INTRAVENOUS | Status: AC
  Administered 2019-05-10: 1000 mL via INTRAVENOUS

## 2019-05-10 MED ORDER — IOHEXOL 300 MG/ML  SOLN
100.0000 mL | Freq: Once | INTRAMUSCULAR | Status: AC | PRN
  Administered 2019-05-10: 100 mL via INTRAVENOUS

## 2019-05-10 NOTE — ED Notes (Signed)
ED Provider at bedside. 

## 2019-05-10 NOTE — Discharge Instructions (Addendum)
Please take medication as prescribed for your pain; continue taking your antibiotics and omeprazole as prescribed; please follow up with PCP regarding your ED visit today/need for endoscopy. Please also follow up with your gastroenterologist.

## 2019-05-10 NOTE — ED Triage Notes (Signed)
Pt c/o abd pain recent DX h pylori

## 2019-05-10 NOTE — ED Notes (Signed)
PT waiting in room for ride to arrive. Po fluids given. No c/o

## 2019-05-10 NOTE — ED Provider Notes (Signed)
[place] EMERGENCY DEPARTMENT Provider Note   CSN: [id number] Arrival date & time: 05/10/19  1358    History   Chief Complaint Chief Complaint  Patient presents with  . Abdominal Pain    HPI Gary Grimes is a 58 y.o. male with PMHx HTN, HLD, GERD, Type II DM, OSA on CPAP, and a fib s/p ablation who presents to the ED today complaining of diffuse abdominal pain greater in the epigastrium x several weeks. Pt was evaluated by PCP last week and tested positive for h pylori and is in the process of getting set up to have an endoscopy. He presents to the ED with worsening pain after starting his h pylro regimen with omeprazole, clindamycin, and amoxicillin 5 days ago. Pt states he called his PCP and was advised to come to the ED for further workup including a CT scan of his abdomen. He denies fever, chills, nausea, vomiting, constipation, urinary complaints, or any other associated symptoms. PSHx includes ventral hernia and appendectomy.        Past Medical History:  Diagnosis Date  . Anginal pain ([place])    ER visit 01/14/2014 visit on chart   . Anxiety    pt denies  . Arthritis    'q inch of my body" (10/28/2018)  . Bipolar disorder ([place])   . Chronic lower back pain   . Depression    did get seen in er 4/13 for evaluation-psyc  . GERD (gastroesophageal reflux disease)   . Headache    "bad ones lately; weekly" (10/28/2018)  . High cholesterol   . High triglycerides   . Hypertension   . Levator syndrome 2001   history   . OSA on CPAP   . Panic attacks   . Perirectal abscess   . S/P ablation of atrial fibrillation   . Type II diabetes mellitus [facility])     Patient Active Problem List   Diagnosis Date Noted  . Plantar fasciitis 02/28/2019  . Type II diabetes mellitus with neurological manifestations ([place]) 02/28/2019  . S/P ablation of atrial fibrillation   . Paroxysmal atrial fibrillation ([place]) 06/06/2018  . Essential hypertension 06/06/2018  . Diabetes  mellitus due to underlying condition with unspecified complications ([place]) [id number]/[id number]  . Sleep apnea 06/06/2018  . Overweight 06/06/2018  . Bipolar disorder, manic ([place]) 05/29/2016  . MDD (major depressive disorder), recurrent severe, without psychosis ([place]) 12/27/2015  . Ventral incisional hernia 01/29/2015  . Perirectal abscess 02/18/2012  . SPONDYLOSIS 09/19/2008    Past Surgical History:  Procedure Laterality Date  . ANAL FISSURE REPAIR  08/05/2000   proctoscopy  . APPENDECTOMY  1984  . ATRIAL FIBRILLATION ABLATION  10/28/2018  . ATRIAL FIBRILLATION ABLATION N/A 10/28/2018   Procedure: ATRIAL FIBRILLATION ABLATION;  Surgeon: Constance [place], MD;  Location: [place] CV LAB;  Service: Cardiovascular;  Laterality: N/A;  . COLONOSCOPY    . HERNIA REPAIR    . INSERTION OF MESH N/A 01/29/2015   Procedure: INSERTION OF MESH;  Surgeon: Excell Seltzer, MD;  Location: [facility];  Service: General;  Laterality: N/A;  . IRRIGATION AND DEBRIDEMENT ABSCESS  02/18/2012   peri-rectal  . LEFT HEART CATH AND CORONARY ANGIOGRAPHY N/A 06/08/2018   Procedure: LEFT HEART CATH AND CORONARY ANGIOGRAPHY;  Surgeon: Leonie Man, MD;  Location: [place] CV LAB;  Service: Cardiovascular;  Laterality: N/A;  . SHOULDER ARTHROSCOPY Left ?2009   "repaired  AC joint; reattached bicept tendon"  . SHOULDER ARTHROSCOPY W/ LABRAL REPAIR Left  08/08/2007  . UMBILICAL HERNIA REPAIR  10/27/2010  . VENTRAL HERNIA REPAIR N/A 01/29/2015   Procedure: LAPAROSCOPIC VENTRAL HERNIA;  Surgeon: Excell Seltzer, MD;  Location: [facility];  Service: General;  Laterality: N/A;        Home Medications    Prior to Admission medications   Medication Sig Start Date End Date Taking? Authorizing Provider  Semaglutide (OZEMPIC, 1 MG/DOSE, [place]) Inject into the skin.   Yes [provider]  amoxicillin (AMOXIL) 875 MG tablet TAKE 1 TABLET BY MOUTH EVERY 12 HOURS FOR 10 DAYS 04/10/19   [provider]  apixaban  (ELIQUIS) 5 MG TABS tablet Take 1 tablet (5 mg total) by mouth 2 (two) times daily. 04/27/19   Revankar, Reita Cliche, MD  Ascorbic Acid (VITAMIN C) 1000 MG tablet Take 4,000 mg by mouth daily.     [provider]  clindamycin (CLEOCIN) 300 MG capsule TAKE 1 CAPSULE (300 MG TOTAL) BY MOUTH 3 TIMES DAILY FOR 10 DAYS. 04/28/19   [provider]  diclofenac sodium (VOLTAREN) 1 % GEL Apply 2 g topically 4 (four) times daily. Rub into affected area of foot 2 to 4 times daily Patient not taking: Reported on 04/05/2019 02/27/19   Trula Slade, DPM  dicyclomine (BENTYL) 20 MG tablet Take 1 tablet (20 mg total) by mouth 2 (two) times daily. 05/10/19   Alroy Bailiff, Alasia Enge, PA-C  Insulin NPH Isophane & Regular (HUMULIN 70/30 [place]) Inject 14 Units into the skin 2 (two) times daily.    [provider]  lithium carbonate (LITHOBID) 300 MG CR tablet Take 900 mg by mouth at bedtime.  05/31/18   [provider]  LORazepam (ATIVAN) 1 MG tablet Take 1 tablet by mouth at bedtime.  05/24/18   [provider]  losartan (COZAAR) 100 MG tablet Take 1 tablet (100 mg total) by mouth every evening. Patient taking differently: Take 100 mg by mouth daily.  01/06/16   Niel Hummer, NP  nitroGLYCERIN (NITROSTAT) 0.4 MG SL tablet Place 1 tablet (0.4 mg total) under the tongue every 5 (five) minutes as needed for up to 25 days for chest pain. 06/06/18   Revankar, Reita Cliche, MD  omeprazole (PRILOSEC) 40 MG capsule Take 40 mg by mouth daily as needed (HEARTBURN).  12/29/16   [provider]  pioglitazone (ACTOS) 30 MG tablet Take 30 mg by mouth every morning.  06/07/18   Revankar, Reita Cliche, MD  pregabalin (LYRICA) 75 MG capsule Take 1 capsule (75 mg total) by mouth 2 (two) times daily. 05/03/19   Trula Slade, DPM  testosterone cypionate (DEPOTESTOSTERONE CYPIONATE) 200 MG/ML injection Inject 1 mL into the muscle once a week. 03/30/19   [provider]  traZODone (DESYREL) 100 MG tablet Take 1  tablet (100 mg total) by mouth at bedtime as needed for sleep. Patient taking differently: Take 100 mg by mouth at bedtime.  01/08/16   Kerrie Buffalo, NP    Family History Family History  Problem Relation Age of Onset  . Breast cancer Mother   . Ovarian cancer Mother     Social History Social History   Tobacco Use  . Smoking status: Former Smoker    Packs/day: 0.50    Years: 1.00    Pack years: 0.50    Types: Cigarettes    Last attempt to quit: 07/06/1977    Years since quitting: 41.8  . Smokeless tobacco: Never Used  Substance Use Topics  . 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 Drug use: Never     Allergies   Morphine   Review of Systems Review of Systems  Constitutional: Negative for chills and fever.  HENT: Negative for congestion.   Eyes: Negative for redness.  Respiratory: Negative for shortness of breath.   Cardiovascular: Negative for chest pain.  Gastrointestinal: Positive for abdominal pain and diarrhea. Negative for constipation, nausea and vomiting.  Genitourinary: Negative for dysuria, flank pain and frequency.  Musculoskeletal: Negative for myalgias.  Skin: Negative for rash.  Neurological: Negative for dizziness, light-headedness and headaches.     Physical Exam Updated Vital Signs BP (!) 131/111 (BP Location: Left Arm)   Pulse 73   Temp 98.2 F (36.8 C)   Resp 18   Ht 6' (1.829 m)   Wt (!) 140.2 kg   SpO2 98%   BMI 41.91 kg/m   Physical Exam Vitals signs and nursing note reviewed.  Constitutional:      Appearance: He is obese. He is not ill-appearing.  HENT:     Head: Normocephalic and atraumatic.  Eyes:     Conjunctiva/sclera: Conjunctivae normal.  Neck:     Musculoskeletal: Neck supple.  Cardiovascular:     Rate and Rhythm: Normal rate and regular rhythm.     Heart sounds: Normal heart sounds.  Pulmonary:     Effort: Pulmonary effort is normal.     Breath sounds: Normal  breath sounds. No wheezing, rhonchi or rales.  Abdominal:     General: Bowel sounds are normal. There is distension.     Palpations: Abdomen is soft.     Tenderness: There is generalized abdominal tenderness. There is no guarding or rebound.  Skin:    General: Skin is warm and dry.  Neurological:     Mental Status: He is alert.      ED Treatments / Results  Labs (all labs ordered are listed, but only abnormal results are displayed) Labs Reviewed  COMPREHENSIVE METABOLIC PANEL - Abnormal; Notable for the following components:      Result Value   Glucose, Bld 145 (*)    All other components within normal limits  CBC WITH DIFFERENTIAL/PLATELET - Abnormal; Notable for the following components:   Abs Immature Granulocytes 0.08 (*)    All other components within normal limits  LIPASE, BLOOD  URINALYSIS, ROUTINE W REFLEX MICROSCOPIC    EKG EKG Interpretation  Date/Time:  Wednesday May 10 2019 14:45:07 EDT Ventricular Rate:  63 PR Interval:    QRS Duration: 96 QT Interval:  415 QTC Calculation: 425 R Axis:   78 Text Interpretation:  Sinus rhythm Abnormal R-wave progression, early transition Baseline wander in lead(s) V3 No STEMI  Confirmed by Nanda Quinton [id number]) on 05/10/2019 3:00:14 PM   Radiology Ct Abdomen Pelvis W Contrast  Result Date: 05/10/2019 CLINICAL DATA:  Increasing abdominal pain, generalized EXAM: CT ABDOMEN AND PELVIS WITH CONTRAST TECHNIQUE: Multidetector CT imaging of the abdomen and pelvis was performed using the standard protocol following bolus administration of intravenous contrast. CONTRAST:  161mL OMNIPAQUE IOHEXOL 300 MG/ML  SOLN COMPARISON:  Report from 10/01/2000 FINDINGS: Lower chest: Right coronary artery atherosclerotic calcification. Hepatobiliary: Unremarkable Pancreas: Unremarkable Spleen: Unremarkable Adrenals/Urinary Tract: A 1.8 by 1.8 cm left adrenal mass has a relative washout of 40% compatible with adenoma. Fluid density lesions in both  kidneys favor cysts. Urinary bladder unremarkable. Stomach/Bowel: Sigmoid diverticulosis. Vascular/Lymphatic: Mild abdominal aortic atherosclerotic vascular calcification. Reproductive: Unremarkable Other: No supplemental non-categorized findings. Musculoskeletal:  Ventral hernia mesh noted. There is a 4.0 cm recurrent fat containing ventral hernia believed to be extending along the left inferior margin of the mesh. IMPRESSION: 1. A 4.0 cm recurrent fat containing ventral hernia is present in the umbilical region, likely extending around the left inferior margin of the hernia mesh. 2. Aortic Atherosclerosis (ICD10-I70.0). Right coronary artery atherosclerotic calcification. 3. Small left adrenal adenoma. 4. Bilateral renal cysts. 5. Sigmoid colon diverticulosis without findings of active diverticulitis. Electronically Signed   By: Van Clines M.D.   On: 05/10/2019 15:41    Procedures Procedures (including critical care time)  Medications Ordered in ED Medications  sodium chloride 0.9 % bolus 1,000 mL (1,000 mLs Intravenous New Bag/Given 05/10/19 1453)  HYDROmorphone (DILAUDID) injection 0.5 mg (0.5 mg Intravenous Given 05/10/19 1454)  iohexol (OMNIPAQUE) 300 MG/ML solution 100 mL (100 mLs Intravenous Contrast Given 05/10/19 1510)     Initial Impression / Assessment and Plan / ED Course  I have reviewed the triage vital signs and the nursing notes.  Pertinent labs & imaging results that were available during my care of the patient were reviewed by me and considered in my medical decision making (see chart for details).    Pt is a 58 year old male who was recently diagnosed with h pylori infection complaining of worsening diffuse abdominal pain; sent over from PCP's office for further eval. Has been on his antibiotic + PPI regimen for the past 5 days. Pt is diffusely tender on exam. Will order CT A/P as recommended by pt's PCP; screening labs ordered as well. 1 L NS bolus given as well as  Dilaudid for pain; pt reports adverse reaction to morphine but believes he has had dilaudid in the past without issue.   Bloodwork negative at this time including no leukocytosis to suggest infectious process, elevation in lipase, or electrolyte abnormalities. CT with incidentaloma of adrenal gland but no findings to suggest abdominal pain; pt likely experiencing discomfort related to his h pylori infection. Will discharge home with Bentyl for spasmodic type pain/watery stools. Advised continuation of his triple therapy for h pylori infection and follow up with both PCP and GI doctors regarding ED visit. Advised pt to mention adrenal adenoma to GI doctor when he has telemedicine visit with him tomorrow. Pt is in agreement with plan and stable for discharge at this time.        Final Clinical Impressions(s) / ED Diagnoses   Final diagnoses:  Epigastric pain  H. pylori infection    ED Discharge Orders         Ordered    dicyclomine (BENTYL) 20 MG tablet  2 times daily     05/10/19 1606           Eustaquio Maize, PA-C 05/10/19 1727    Long, Wonda Olds, MD 05/10/19 2025

## 2019-05-10 NOTE — ED Notes (Signed)
Pt still waiting for a ride to come

## 2019-05-10 NOTE — ED Notes (Signed)
Pt on monitor 

## 2019-05-11 ENCOUNTER — Other Ambulatory Visit: Payer: Self-pay | Admitting: Gastroenterology

## 2019-05-15 ENCOUNTER — Ambulatory Visit (INDEPENDENT_AMBULATORY_CARE_PROVIDER_SITE_OTHER): Payer: No Typology Code available for payment source | Admitting: Otolaryngology

## 2019-05-15 ENCOUNTER — Other Ambulatory Visit: Payer: Self-pay

## 2019-05-15 DIAGNOSIS — J343 Hypertrophy of nasal turbinates: Secondary | ICD-10-CM

## 2019-05-15 DIAGNOSIS — J342 Deviated nasal septum: Secondary | ICD-10-CM

## 2019-05-15 DIAGNOSIS — J310 Chronic rhinitis: Secondary | ICD-10-CM | POA: Diagnosis not present

## 2019-05-16 ENCOUNTER — Other Ambulatory Visit: Payer: Self-pay | Admitting: Otolaryngology

## 2019-05-17 ENCOUNTER — Other Ambulatory Visit: Payer: Self-pay

## 2019-05-17 ENCOUNTER — Telehealth (INDEPENDENT_AMBULATORY_CARE_PROVIDER_SITE_OTHER): Payer: No Typology Code available for payment source | Admitting: Cardiology

## 2019-05-17 ENCOUNTER — Encounter: Payer: Self-pay | Admitting: Cardiology

## 2019-05-17 VITALS — Ht 72.0 in | Wt 305.0 lb

## 2019-05-17 DIAGNOSIS — Z01810 Encounter for preprocedural cardiovascular examination: Secondary | ICD-10-CM | POA: Insufficient documentation

## 2019-05-17 DIAGNOSIS — I2510 Atherosclerotic heart disease of native coronary artery without angina pectoris: Secondary | ICD-10-CM

## 2019-05-17 DIAGNOSIS — I4891 Unspecified atrial fibrillation: Secondary | ICD-10-CM | POA: Diagnosis not present

## 2019-05-17 DIAGNOSIS — E119 Type 2 diabetes mellitus without complications: Secondary | ICD-10-CM

## 2019-05-17 DIAGNOSIS — F332 Major depressive disorder, recurrent severe without psychotic features: Secondary | ICD-10-CM

## 2019-05-17 DIAGNOSIS — E088 Diabetes mellitus due to underlying condition with unspecified complications: Secondary | ICD-10-CM

## 2019-05-17 DIAGNOSIS — I480 Paroxysmal atrial fibrillation: Secondary | ICD-10-CM

## 2019-05-17 DIAGNOSIS — G4730 Sleep apnea, unspecified: Secondary | ICD-10-CM

## 2019-05-17 DIAGNOSIS — I10 Essential (primary) hypertension: Secondary | ICD-10-CM

## 2019-05-17 DIAGNOSIS — G629 Polyneuropathy, unspecified: Secondary | ICD-10-CM | POA: Insufficient documentation

## 2019-05-17 DIAGNOSIS — Z8679 Personal history of other diseases of the circulatory system: Secondary | ICD-10-CM

## 2019-05-17 DIAGNOSIS — Z9861 Coronary angioplasty status: Secondary | ICD-10-CM

## 2019-05-17 DIAGNOSIS — E782 Mixed hyperlipidemia: Secondary | ICD-10-CM

## 2019-05-17 DIAGNOSIS — Z7901 Long term (current) use of anticoagulants: Secondary | ICD-10-CM

## 2019-05-17 DIAGNOSIS — E669 Obesity, unspecified: Secondary | ICD-10-CM

## 2019-05-17 DIAGNOSIS — E663 Overweight: Secondary | ICD-10-CM

## 2019-05-17 HISTORY — DX: Atherosclerotic heart disease of native coronary artery without angina pectoris: I25.10

## 2019-05-17 HISTORY — DX: Encounter for preprocedural cardiovascular examination: Z01.810

## 2019-05-17 NOTE — Patient Instructions (Signed)
Medication Instructions:  Your physician recommends that you continue on your current medications as directed. Please refer to the Current Medication list given to you today.  If you need a refill on your cardiac medications before your next appointment, please call your pharmacy.   Lab work: NONE If you have labs (blood work) drawn today and your tests are completely normal, you will receive your results only by: . MyChart Message (if you have MyChart) OR . A paper copy in the mail If you have any lab test that is abnormal or we need to change your treatment, we will call you to review the results.  Testing/Procedures: NONE  Follow-Up: At [facility], you and your health needs are our priority.  As part of our continuing mission to provide you with exceptional heart care, we have created designated Provider Care Teams.  These Care Teams include your primary Cardiologist (physician) and Advanced Practice Providers (APPs -  Physician Assistants and Nurse Practitioners) who all work together to provide you with the care you need, when you need it. You will need a follow up appointment in 6 months.    

## 2019-05-17 NOTE — Progress Notes (Signed)
Virtual Visit via Video Note   This visit type was conducted due to national recommendations for restrictions regarding the COVID-19 Pandemic (e.g. social distancing) in an effort to limit this patient's exposure and mitigate transmission in our community.  Due to his co-morbid illnesses, this patient is at least at moderate risk for complications without adequate follow up.  This format is felt to be most appropriate for this patient at this time.  All issues noted in this document were discussed and addressed.  A limited physical exam was performed with this format.  Please refer to the patient's chart for his consent to telehealth for [facility].   Date:  05/17/2019   ID:  Gary Grimes, DOB 08/06/1961, MRN [id number]  Patient Location: Home Provider Location: Home  PCP:  Shirline Frees, MD  Cardiologist:  No primary care provider on file.  Electrophysiologist:  Will Meredith Leeds, MD   Evaluation Performed:  Follow-Up Visit  Chief Complaint: Preoperative cardiovascular stratification  History of Present Illness:    Gary Grimes is a 58 y.o. male with past medical history of mild coronary artery disease by coronary angiography, approximately fibrillation post ablation and on anticoagulation, essential hypertension, dyslipidemia, diabetes mellitus and morbid obesity.  Patient mentions to me that he takes care of activities of daily living.  No chest pain orthopnea or PND.  He also mentions to me that he walks about a mile a day without any problems.  At the time of my evaluation, the patient is alert awake oriented and in no distress.  He continues to be on anticoagulation.  The patient does not have symptoms concerning for COVID-19 infection (fever, chills, cough, or new shortness of breath).    Past Medical History:  Diagnosis Date  . Anginal pain ([place])    ER visit 01/14/2014 visit on chart   . Anxiety    pt denies  . Arthritis    'q inch of my body" (10/28/2018)  .  Bipolar disorder ([place])   . Chronic lower back pain   . Depression    did get seen in er 4/13 for evaluation-psyc  . GERD (gastroesophageal reflux disease)   . Headache    "bad ones lately; weekly" (10/28/2018)  . High cholesterol   . High triglycerides   . Hypertension   . Levator syndrome 2001   history   . OSA on CPAP   . Panic attacks   . Perirectal abscess   . S/P ablation of atrial fibrillation   . Type II diabetes mellitus ([place])    Past Surgical History:  Procedure Laterality Date  . ANAL FISSURE REPAIR  08/05/2000   proctoscopy  . APPENDECTOMY  1984  . ATRIAL FIBRILLATION ABLATION  10/28/2018  . ATRIAL FIBRILLATION ABLATION N/A 10/28/2018   Procedure: ATRIAL FIBRILLATION ABLATION;  Surgeon: Constance [facility], MD;  Location: [place] CV LAB;  Service: Cardiovascular;  Laterality: N/A;  . COLONOSCOPY    . HERNIA REPAIR    . INSERTION OF MESH N/A 01/29/2015   Procedure: INSERTION OF MESH;  Surgeon: Excell Seltzer, MD;  [facility];  Service: General;  Laterality: N/A;  . IRRIGATION AND DEBRIDEMENT ABSCESS  02/18/2012   peri-rectal  . LEFT HEART CATH AND CORONARY ANGIOGRAPHY N/A 06/08/2018   Procedure: LEFT HEART CATH AND CORONARY ANGIOGRAPHY;  Surgeon: Leonie Man, MD;  Location: [place] CV LAB;  Service: Cardiovascular;  Laterality: N/A;  . SHOULDER ARTHROSCOPY Left ?2009   "repaired  AC joint; reattached  bicept tendon"  . SHOULDER ARTHROSCOPY W/ LABRAL REPAIR Left 08/08/2007  . UMBILICAL HERNIA REPAIR  10/27/2010  . VENTRAL HERNIA REPAIR N/A 01/29/2015   Procedure: LAPAROSCOPIC VENTRAL HERNIA;  Surgeon: Excell Seltzer, MD;  [facility];  Service: General;  Laterality: N/A;     Current Meds  Medication Sig  . amoxicillin (AMOXIL) 875 MG tablet TAKE 1 TABLET BY MOUTH EVERY 12 HOURS FOR 10 DAYS  . apixaban (ELIQUIS) 5 MG TABS tablet Take 1 tablet (5 mg total) by mouth 2 (two) times daily.  . Ascorbic Acid (VITAMIN C) 1000 MG tablet Take 4,000 mg  by mouth daily.   . diclofenac sodium (VOLTAREN) 1 % GEL Apply 2 g topically 4 (four) times daily. Rub into affected area of foot 2 to 4 times daily  . dicyclomine (BENTYL) 20 MG tablet Take 1 tablet (20 mg total) by mouth 2 (two) times daily.  . Insulin NPH Isophane & Regular (HUMULIN 70/30 [place]) Inject 14 Units into the skin 2 (two) times daily.  Marland Kitchen lithium carbonate (LITHOBID) 300 MG CR tablet Take 900 mg by mouth at bedtime.   Marland Kitchen LORazepam (ATIVAN) 1 MG tablet Take 1 tablet by mouth at bedtime.   Marland Kitchen losartan (COZAAR) 100 MG tablet Take 1 tablet (100 mg total) by mouth every evening. (Patient taking differently: Take 100 mg by mouth daily. )  . nitroGLYCERIN (NITROSTAT) 0.4 MG SL tablet Place 1 tablet (0.4 mg total) under the tongue every 5 (five) minutes as needed for up to 25 days for chest pain.  Marland Kitchen omeprazole (PRILOSEC) 40 MG capsule Take 40 mg by mouth daily as needed (HEARTBURN).   . pioglitazone (ACTOS) 30 MG tablet Take 30 mg by mouth every morning.   . pregabalin (LYRICA) 75 MG capsule Take 1 capsule (75 mg total) by mouth 2 (two) times daily.  . Semaglutide (OZEMPIC, 1 MG/DOSE, [place]) Inject into the skin.  Marland Kitchen testosterone cypionate (DEPOTESTOSTERONE CYPIONATE) 200 MG/ML injection Inject 1 mL into the muscle once a week.  . traZODone (DESYREL) 100 MG tablet Take 1 tablet (100 mg total) by mouth at bedtime as needed for sleep. (Patient taking differently: Take 100 mg by mouth at bedtime. )     Allergies:   Morphine   Social History   Tobacco Use  . Smoking status: Former Smoker    Packs/day: 0.50    Years: 1.00    Pack years: 0.50    Types: Cigarettes    Last attempt to quit: 07/06/1977    Years since quitting: 41.8  . Smokeless tobacco: Never Used  Substance Use Topics  . 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 Drug use: Never     Family Hx: The patient's family history includes Breast cancer in his mother; Ovarian cancer in  his mother.  ROS:   Please see the history of present illness.    As above All other systems reviewed and are negative.   Prior CV studies:   The following studies were reviewed today:  I reviewed coronary angiography report and echocardiograph report with the patient at extensive length  Labs/Other Tests and Data Reviewed:    EKG:  No ECG reviewed.  Recent Labs: 05/10/2019: ALT 32; BUN 12; Creatinine, Ser 1.17; Hemoglobin 14.6; Platelets 254; Potassium 4.1; Sodium 138   Recent Lipid Panel No results found for: CHOL, TRIG, HDL, CHOLHDL, LDLCALC, LDLDIRECT  Wt Readings from Last 3 Encounters:  05/17/19 (!) 305  lb (138.3 kg)  05/10/19 (!) 309 lb (140.2 kg)  12/19/18 298 lb 4 oz (135.3 kg)     Objective:    Vital Signs:  Ht 6' (1.829 m)   Wt (!) 305 lb (138.3 kg)   BMI 41.37 kg/m    VITAL SIGNS:  reviewed  ASSESSMENT & PLAN:    1. Preoperative cardiovascular stratification: The patient has mild coronary artery disease and is on anticoagulation for atrial fibrillation.  His effort tolerance is good and based on disease noted high risk for coronary events during the procedure she is planning to go namely colonoscopy and nasal surgery.  I told him to continue walking a mile a day on a daily basis and he is agreeable.  Meticulous hemodynamic monitoring will further reduce the risk of coronary events.  In view of his diabetes it might be worth getting an evaluation by his primary care physician also. 2. Coronary artery disease: This is very mild coronary angiography in 2019 3. Essential hypertension: Blood pressure is stable.  He tells me that he had it checked at primary care office recently and it was fine he was told about it. 4. Diabetes mellitus, mixed dyslipidemia and obesity: Diet was discussed at extensive length and he vocalized understanding and plans to enforce strict measures in place to do better. 5. Anticoagulation: I gave him choices of withholding anticoagulation  for a couple of days in the periprocedural period.  He is agreeable to this.  Bridging with Lovenox was also discussed and is not keen on it and I respect his wishes.  Benefits and potential risks were explained. 6. Patient will be seen in follow-up appointment in 6 months or earlier if the patient has any concerns   COVID-19 Education: The signs and symptoms of COVID-19 were discussed with the patient and how to seek care for testing (follow up with PCP or arrange E-visit).  The importance of social distancing was discussed today.  Time:   Today, I have spent 15 minutes with the patient with telehealth technology discussing the above problems.     Medication Adjustments/Labs and Tests Ordered: Current medicines are reviewed at length with the patient today.  Concerns regarding medicines are outlined above.   Tests Ordered: No orders of the defined types were placed in this encounter.   Medication Changes: No orders of the defined types were placed in this encounter.   Disposition:  Follow up in 4 month(s)  Signed, Jenean Lindau, MD  05/17/2019 4:47 PM    [place]

## 2019-05-19 ENCOUNTER — Other Ambulatory Visit (HOSPITAL_COMMUNITY)
Admission: RE | Admit: 2019-05-19 | Discharge: 2019-05-19 | Disposition: A | Discharge status: Home / Self Care | Payer: No Typology Code available for payment source | Source: Ambulatory Visit | Attending: Gastroenterology | Admitting: Gastroenterology

## 2019-05-19 DIAGNOSIS — Z01812 Encounter for preprocedural laboratory examination: Secondary | ICD-10-CM | POA: Diagnosis present

## 2019-05-19 DIAGNOSIS — Z1159 Encounter for screening for other viral diseases: Secondary | ICD-10-CM | POA: Diagnosis not present

## 2019-05-20 LAB — NOVEL CORONAVIRUS, NAA (HOSP ORDER, SEND-OUT TO REF LAB; TAT 18-24 HRS): SARS-CoV-2, NAA: NOT DETECTED

## 2019-05-23 ENCOUNTER — Encounter (HOSPITAL_COMMUNITY): Payer: Self-pay | Admitting: *Deleted

## 2019-05-23 ENCOUNTER — Other Ambulatory Visit: Payer: No Typology Code available for payment source

## 2019-05-23 ENCOUNTER — Other Ambulatory Visit: Payer: Self-pay

## 2019-05-24 ENCOUNTER — Ambulatory Visit (HOSPITAL_COMMUNITY): Payer: No Typology Code available for payment source | Admitting: Registered Nurse

## 2019-05-24 ENCOUNTER — Encounter (HOSPITAL_COMMUNITY): Payer: Self-pay | Admitting: *Deleted

## 2019-05-24 ENCOUNTER — Ambulatory Visit (HOSPITAL_COMMUNITY)
Admission: RE | Admit: 2019-05-24 | Discharge: 2019-05-24 | Disposition: A | Discharge status: Home / Self Care | Payer: No Typology Code available for payment source | Attending: Gastroenterology | Admitting: Gastroenterology

## 2019-05-24 ENCOUNTER — Encounter (HOSPITAL_COMMUNITY)
Admission: RE | Disposition: A | Discharge status: Home / Self Care | Payer: Self-pay | Source: Home / Self Care | Attending: Gastroenterology

## 2019-05-24 DIAGNOSIS — Z794 Long term (current) use of insulin: Secondary | ICD-10-CM | POA: Diagnosis not present

## 2019-05-24 DIAGNOSIS — Z79899 Other long term (current) drug therapy: Secondary | ICD-10-CM | POA: Diagnosis not present

## 2019-05-24 DIAGNOSIS — K219 Gastro-esophageal reflux disease without esophagitis: Secondary | ICD-10-CM | POA: Insufficient documentation

## 2019-05-24 DIAGNOSIS — M1990 Unspecified osteoarthritis, unspecified site: Secondary | ICD-10-CM | POA: Diagnosis not present

## 2019-05-24 DIAGNOSIS — E119 Type 2 diabetes mellitus without complications: Secondary | ICD-10-CM | POA: Diagnosis not present

## 2019-05-24 DIAGNOSIS — R1013 Epigastric pain: Secondary | ICD-10-CM | POA: Diagnosis present

## 2019-05-24 DIAGNOSIS — Z6841 Body Mass Index (BMI) 40.0 and over, adult: Secondary | ICD-10-CM | POA: Insufficient documentation

## 2019-05-24 DIAGNOSIS — I4891 Unspecified atrial fibrillation: Secondary | ICD-10-CM | POA: Insufficient documentation

## 2019-05-24 DIAGNOSIS — Z87891 Personal history of nicotine dependence: Secondary | ICD-10-CM | POA: Diagnosis not present

## 2019-05-24 DIAGNOSIS — G4733 Obstructive sleep apnea (adult) (pediatric): Secondary | ICD-10-CM | POA: Diagnosis not present

## 2019-05-24 DIAGNOSIS — G8929 Other chronic pain: Secondary | ICD-10-CM | POA: Diagnosis not present

## 2019-05-24 DIAGNOSIS — M545 Low back pain: Secondary | ICD-10-CM | POA: Insufficient documentation

## 2019-05-24 DIAGNOSIS — E7800 Pure hypercholesterolemia, unspecified: Secondary | ICD-10-CM | POA: Insufficient documentation

## 2019-05-24 DIAGNOSIS — K2950 Unspecified chronic gastritis without bleeding: Secondary | ICD-10-CM | POA: Diagnosis not present

## 2019-05-24 DIAGNOSIS — F319 Bipolar disorder, unspecified: Secondary | ICD-10-CM | POA: Diagnosis not present

## 2019-05-24 DIAGNOSIS — Z7901 Long term (current) use of anticoagulants: Secondary | ICD-10-CM | POA: Insufficient documentation

## 2019-05-24 DIAGNOSIS — I10 Essential (primary) hypertension: Secondary | ICD-10-CM | POA: Insufficient documentation

## 2019-05-24 HISTORY — PX: BIOPSY: SHX5522

## 2019-05-24 HISTORY — PX: ESOPHAGOGASTRODUODENOSCOPY (EGD) WITH PROPOFOL: SHX5813

## 2019-05-24 LAB — GLUCOSE, CAPILLARY: Glucose-Capillary: 155 mg/dL — ABNORMAL HIGH (ref 70–99)

## 2019-05-24 SURGERY — ESOPHAGOGASTRODUODENOSCOPY (EGD) WITH PROPOFOL
Anesthesia: Monitor Anesthesia Care

## 2019-05-24 MED ORDER — LACTATED RINGERS IV SOLN
INTRAVENOUS | Status: DC
  Administered 2019-05-24: 07:00:00 1000 mL via INTRAVENOUS

## 2019-05-24 MED ORDER — LACTATED RINGERS IV SOLN
INTRAVENOUS | Status: DC | PRN
  Administered 2019-05-24: 07:00:00 via INTRAVENOUS

## 2019-05-24 MED ORDER — PROPOFOL 10 MG/ML IV BOLUS
INTRAVENOUS | Status: AC
  Filled 2019-05-24: qty 20

## 2019-05-24 MED ORDER — SODIUM CHLORIDE 0.9 % IV SOLN: INTRAVENOUS | Status: DC

## 2019-05-24 MED ORDER — LIDOCAINE HCL (CARDIAC) PF 100 MG/5ML IV SOSY
PREFILLED_SYRINGE | INTRAVENOUS | Status: DC | PRN
  Administered 2019-05-24: 75 mg via INTRAVENOUS

## 2019-05-24 MED ORDER — PROPOFOL 500 MG/50ML IV EMUL
INTRAVENOUS | Status: DC | PRN
  Administered 2019-05-24: 300 ug/kg/min via INTRAVENOUS

## 2019-05-24 MED ORDER — MIDAZOLAM HCL 2 MG/2ML IJ SOLN
INTRAMUSCULAR | Status: AC
  Filled 2019-05-24: qty 2

## 2019-05-24 MED ORDER — GLYCOPYRROLATE 0.2 MG/ML IJ SOLN
INTRAMUSCULAR | Status: DC | PRN
  Administered 2019-05-24: 0.2 mg via INTRAVENOUS

## 2019-05-24 MED ORDER — PROPOFOL 10 MG/ML IV BOLUS
INTRAVENOUS | Status: AC
  Filled 2019-05-24: qty 60

## 2019-05-24 SURGICAL SUPPLY — 15 items

## 2019-05-24 NOTE — Transfer of Care (Signed)
Immediate Anesthesia Transfer of Care Note  Patient: Gary Grimes  Procedure(s) Performed: ESOPHAGOGASTRODUODENOSCOPY (EGD) WITH PROPOFOL (N/A )  Patient Location: PACU  Anesthesia Type:MAC  Level of Consciousness: awake, alert , oriented and patient cooperative  Airway & Oxygen Therapy: Patient Spontanous Breathing and Patient connected to nasal cannula oxygen  Post-op Assessment: Report given to RN, Post -op Vital signs reviewed and stable and Patient moving all extremities X 4  Post vital signs: stable  Last Vitals:  Vitals Value Taken Time  BP    Temp    Pulse    Resp    SpO2      Last Pain:  Vitals:   05/24/19 0830  TempSrc:   PainSc: 0-No pain         Complications: No apparent anesthesia complications

## 2019-05-24 NOTE — Anesthesia Procedure Notes (Signed)
Procedure Name: MAC Date/Time: 05/24/2019 7:37 AM Performed by: Lissa Morales, CRNA Pre-anesthesia Checklist: Patient identified, Emergency Drugs available, Suction available, Patient being monitored and Timeout performed Patient Re-evaluated:Patient Re-evaluated prior to induction Oxygen Delivery Method: Nasal cannula Placement Confirmation: positive ETCO2

## 2019-05-24 NOTE — Anesthesia Postprocedure Evaluation (Signed)
Anesthesia Post Note  Patient: Gary Grimes  Procedure(s) Performed: ESOPHAGOGASTRODUODENOSCOPY (EGD) WITH PROPOFOL (N/A )     Patient location during evaluation: Endoscopy Anesthesia Type: MAC Level of consciousness: awake and alert Pain management: pain level controlled Vital Signs Assessment: post-procedure vital signs reviewed and stable Respiratory status: spontaneous breathing, nonlabored ventilation, respiratory function stable and patient connected to nasal cannula oxygen Cardiovascular status: blood pressure returned to baseline and stable Postop Assessment: no apparent nausea or vomiting Anesthetic complications: no    Last Vitals:  Vitals:   05/24/19 0830 05/24/19 0835  BP: 138/81   Pulse: (!) 52 66  Resp: 15 15  Temp:    SpO2: 100% 98%    Last Pain:  Vitals:   05/24/19 0830  TempSrc:   PainSc: 0-No pain                 Ysenia Filice S

## 2019-05-24 NOTE — Anesthesia Preprocedure Evaluation (Signed)
Anesthesia Evaluation  Patient identified by MRN, date of birth, ID band Patient awake    Reviewed: Allergy & Precautions, H&P , NPO status , Patient's Chart, lab work & pertinent test results  Airway Mallampati: II   Neck ROM: full    Dental   Pulmonary sleep apnea , former smoker,    breath sounds clear to auscultation       Cardiovascular hypertension, + angina + CAD  + dysrhythmias Atrial Fibrillation  Rhythm:regular Rate:Normal  S/p EP ablation   Neuro/Psych  Headaches, PSYCHIATRIC DISORDERS Anxiety Depression Bipolar Disorder  Neuromuscular disease    GI/Hepatic GERD  ,  Endo/Other  diabetes, Type 2  Renal/GU      Musculoskeletal  (+) Arthritis ,   Abdominal   Peds  Hematology   Anesthesia Other Findings   Reproductive/Obstetrics                             Anesthesia Physical Anesthesia Plan  ASA: III  Anesthesia Plan: MAC   Post-op Pain Management:    Induction: Intravenous  PONV Risk Score and Plan: 1 and Propofol infusion and Treatment may vary due to age or medical condition  Airway Management Planned: Nasal Cannula  Additional Equipment:   Intra-op Plan:   Post-operative Plan:   Informed Consent: I have reviewed the patients History and Physical, chart, labs and discussed the procedure including the risks, benefits and alternatives for the proposed anesthesia with the patient or authorized representative who has indicated his/her understanding and acceptance.       Plan Discussed with: CRNA, Anesthesiologist and Surgeon  Anesthesia Plan Comments:         Anesthesia Quick Evaluation

## 2019-05-24 NOTE — Op Note (Signed)
[facility] Patient Name: Gary Grimes Procedure Date: 05/24/2019 MRN: [id number] Attending MD: Ronald Lobo , MD Date of Birth: 1961/08/08 CSN: [id number] Age: 58 Admit Type: Inpatient Procedure:                Upper GI endoscopy Indications:              Epigastric abdominal pain, Dyspepsia--CT negative,                            no response to antipeptic therapy or treatment for                            H. pylori (serologic test positive) Providers:                Ronald Lobo, MD, Cleda Daub, RN, Ladona Ridgel, Technician, Enrigue Catena, CRNA Referring MD:              Medicines:                Monitored Anesthesia Care Complications:            No immediate complications. Estimated Blood Loss:     Estimated blood loss was minimal. Procedure:                Pre-Anesthesia Assessment:                           - Prior to the procedure, a History and Physical                            was performed, and patient medications and                            allergies were reviewed. The patient's tolerance of                            previous anesthesia was also reviewed. The risks                            and benefits of the procedure and the sedation                            options and risks were discussed with the patient.                            All questions were answered, and informed consent                            was obtained. Prior Anticoagulants: The patient has                            taken Eliquis (apixaban), last dose was 2 days  prior to procedure. ASA Grade Assessment: III - A                            patient with severe systemic disease. After                            reviewing the risks and benefits, the patient was                            deemed in satisfactory condition to undergo the                            procedure.                           After obtaining  informed consent, the endoscope was                            passed under direct vision. Throughout the                            procedure, the patient's blood pressure, pulse, and                            oxygen saturations were monitored continuously. The                            GIF-H190 ([id number]) Olympus gastroscope was                            introduced through the mouth, and advanced to the                            second part of duodenum. The upper GI endoscopy was                            accomplished without difficulty. The patient                            tolerated the procedure well. Scope In: Scope Out: Findings:      The larynx was normal.      The examined esophagus was normal.      There is no endoscopic evidence of Barrett's esophagus, esophagitis,       hiatal hernia, stricture, varices or mass in the entire esophagus.      A medium amount of food (residue) was found in the gastric fundus and on       the greater curvature of the stomach despite a 12-hour fast. This       obscured visualization of a moderate amount of the fundic surface area.      The exam of the stomach was otherwise normal. Random biopsies were       obtained from the antrum and the fundus. NOTE: patient just finished (3       days ago) a 14-day course of amoxicillin/clarithromycin for a positive  H. pylori antibody test.      There is no endoscopic evidence of erythema or inflammatory changes       suggestive of gastritis, ulceration or mass in the entire examined       stomach.      The cardia and gastric fundus were normal on retroflexion.      The pylorus was widely patent.      Some normal-appearing antral contractility was noted, with stripping       waves moving toward the pylorus.      The examined duodenum was normal. Biopsies were taken with a cold       forceps for histology. Impression:               - Normal larynx.                           - Normal esophagus.                            - A medium amount of food (residue) in the proximal                            stomach, obscuring visualization of that area.                           - Normal examined duodenum. Biopsied.                           - The retained food in the stomach is suggestive of                            diabetic gastroparesis. Moderate Sedation:      This patient was sedated with monitored anesthesia care, not moderate       sedation. Recommendation:           - Await pathology results.                           - Trial of gastroparesis diet. Procedure Code(s):        --- Professional ---                           [phone], Esophagogastroduodenoscopy, flexible,                            transoral; with biopsy, single or multiple Diagnosis Code(s):        --- Professional ---                           R10.13, Epigastric pain CPT copyright 2019 [employer] Association. All rights reserved. The codes documented in this report are preliminary and upon coder review may  be revised to meet current compliance requirements. Ronald Lobo, MD 05/24/2019 8:09:03 AM This report has been signed electronically. Number of Addenda: 0

## 2019-05-24 NOTE — Discharge Instructions (Signed)

## 2019-05-24 NOTE — H&P (Signed)
Gary Grimes is an 58 y.o. male.   Chief Complaint: Epigastric pain HPI: Pleasant 58 year old gentleman with multiple medical problems including morbid obesity, atrial fibrillation on chronic anticoagulation, diabetes, and sleep apnea, who is wife died of gastric or duodenal cancer, has a several week history of more or less relentless epigastric symptoms, primarily postprandial abdominal fullness and some degree of epigastric discomfort, prompting emergency room evaluation where a CT scan was negative for any evidence of problems, and blood work was unremarkable.  He did test positive serologically for Helicobacter pylori, and has just completed a two-week course of antibiotic therapy for that organism, without any evident clinical benefit.  Because of his wife's unfortunate history, he is very anxious about his symptoms.  He is not aware of any definite weight loss although he does feel like his clothes are somewhat bigger on him than usual.  Past Medical History:  Diagnosis Date  . Anginal pain ([place])    ER visit 01/14/2014 visit on chart   . Anxiety    pt denies  . Arthritis    'q inch of my body" (10/28/2018)  . Bipolar disorder ([place])   . Chronic lower back pain   . Depression    did get seen in er 4/13 for evaluation-psyc  . GERD (gastroesophageal reflux disease)   . Headache    "bad ones lately; weekly" (10/28/2018)  . High cholesterol   . High triglycerides   . Hypertension   . Levator syndrome 2001   history   . OSA on CPAP   . Panic attacks   . Perirectal abscess   . S/P ablation of atrial fibrillation   . Type II diabetes mellitus ([place])     Past Surgical History:  Procedure Laterality Date  . ANAL FISSURE REPAIR  08/05/2000   proctoscopy  . APPENDECTOMY  1984  . ATRIAL FIBRILLATION ABLATION  10/28/2018  . ATRIAL FIBRILLATION ABLATION N/A 10/28/2018   Procedure: ATRIAL FIBRILLATION ABLATION;  Surgeon: Constance Haw, MD;  Location: [facility];  Service:  Cardiovascular;  Laterality: N/A;  . COLONOSCOPY    . HERNIA REPAIR    . INSERTION OF MESH N/A 01/29/2015   Procedure: INSERTION OF MESH;  Surgeon: Excell Seltzer, MD;  Location: [facility];  Service: General;  Laterality: N/A;  . IRRIGATION AND DEBRIDEMENT ABSCESS  02/18/2012   peri-rectal  . LEFT HEART CATH AND CORONARY ANGIOGRAPHY N/A 06/08/2018   Procedure: LEFT HEART CATH AND CORONARY ANGIOGRAPHY;  Surgeon: Leonie Man, MD;  Location: [facility];  Service: Cardiovascular;  Laterality: N/A;  . SHOULDER ARTHROSCOPY Left ?2009   "repaired  AC joint; reattached bicept tendon"  . SHOULDER ARTHROSCOPY W/ LABRAL REPAIR Left 08/08/2007  . UMBILICAL HERNIA REPAIR  10/27/2010  . VENTRAL HERNIA REPAIR N/A 01/29/2015   Procedure: LAPAROSCOPIC VENTRAL HERNIA;  Surgeon: Excell Seltzer, MD;  Location: [facility];  Service: General;  Laterality: N/A;    Family History  Problem Relation Age of Onset  . Breast cancer Mother   . Ovarian cancer Mother    Social History:  reports that he quit smoking about 41 years ago. His smoking use included cigarettes. He has a 0.50 pack-year smoking history. He has never used smokeless tobacco. He reports that he does not drink alcohol or use drugs.  Allergies:  Allergies  Allergen Reactions  . Morphine Other (See Comments)    PT BECAME DELIRIOUS     Medications Prior to Admission  Medication Sig Dispense Refill  .  apixaban (ELIQUIS) 5 MG TABS tablet Take 1 tablet (5 mg total) by mouth 2 (two) times daily. 180 tablet 3  . Ascorbic Acid (VITAMIN C) 1000 MG tablet Take 4,000 mg by mouth daily.     Marland Kitchen dicyclomine (BENTYL) 20 MG tablet Take 1 tablet (20 mg total) by mouth 2 (two) times daily. 20 tablet 0  . Insulin NPH Isophane & Regular (HUMULIN 70/30 [place]) Inject 14 Units into the skin 2 (two) times daily.    Marland Kitchen lithium carbonate (LITHOBID) 300 MG CR tablet Take 900 mg by mouth at bedtime.   2  . LORazepam (ATIVAN) 1 MG tablet Take 1 tablet by mouth at  bedtime.   5  . losartan (COZAAR) 100 MG tablet Take 1 tablet (100 mg total) by mouth every evening. (Patient taking differently: Take 100 mg by mouth daily. )    . omeprazole (PRILOSEC) 40 MG capsule Take 40 mg by mouth 2 (two) times a day.     . pioglitazone (ACTOS) 30 MG tablet Take 30 mg by mouth every morning.     . pregabalin (LYRICA) 75 MG capsule Take 1 capsule (75 mg total) by mouth 2 (two) times daily. 60 capsule 2  . Semaglutide (OZEMPIC, 1 MG/DOSE, [place]) Inject 0.25 mg into the skin once a week. On saturday    . testosterone cypionate (DEPOTESTOSTERONE CYPIONATE) 200 MG/ML injection Inject 1 mL into the muscle once a week.    . traZODone (DESYREL) 100 MG tablet Take 1 tablet (100 mg total) by mouth at bedtime as needed for sleep. (Patient taking differently: Take 100 mg by mouth at bedtime. ) 30 tablet 0  . nitroGLYCERIN (NITROSTAT) 0.4 MG SL tablet Place 1 tablet (0.4 mg total) under the tongue every 5 (five) minutes as needed for up to 25 days for chest pain. 25 tablet 11    Results for orders placed or performed during the hospital encounter of 05/24/19 (from the past 48 hour(s))  Glucose, capillary     Status: Abnormal   Collection Time: 05/24/19  7:01 AM  Result Value Ref Range   Glucose-Capillary 155 (H) 70 - 99 mg/dL   No results found.  ROS after a small meal, he will feel very full, as though he has had a large meal.  Blood pressure (!) 157/68, pulse 70, temperature 98.6 F (37 C), temperature source Oral, resp. rate 12, height 6' (1.829 m), weight (!) 138.3 kg, SpO2 97 %. Physical Exam large, significantly overweight, pleasant and articulate Caucasian male in no evident distress whatsoever.  Anicteric, no evident pallor, cognitively intact, no overt focal neurologic deficits.  Chest is clear.  Heart is without arrhythmia at present.  Abdomen is obese but without overt tenderness.  Assessment/Plan Persistent, unexplained upper abdominal discomfort with negative  radiographic findings and no response to antipeptic therapy or treatment for Helicobacter pylori.  We will proceed to upper endoscopy, the nature, purpose, and risks of which as well as limitations were reviewed with the patient and he is strongly desirous of proceeding.  Cleotis Nipper, MD 05/24/2019, 7:34 AM

## 2019-05-29 ENCOUNTER — Encounter (HOSPITAL_COMMUNITY): Payer: Self-pay

## 2019-05-29 ENCOUNTER — Other Ambulatory Visit (HOSPITAL_COMMUNITY)
Admission: RE | Admit: 2019-05-29 | Discharge: 2019-05-29 | Disposition: A | Discharge status: Home / Self Care | Payer: No Typology Code available for payment source | Source: Ambulatory Visit | Attending: Otolaryngology | Admitting: Otolaryngology

## 2019-05-29 ENCOUNTER — Other Ambulatory Visit: Payer: Self-pay

## 2019-05-29 ENCOUNTER — Encounter (HOSPITAL_COMMUNITY)
Admission: RE | Admit: 2019-05-29 | Discharge: 2019-05-29 | Disposition: A | Discharge status: Home / Self Care | Payer: No Typology Code available for payment source | Source: Ambulatory Visit | Attending: Otolaryngology | Admitting: Otolaryngology

## 2019-05-29 DIAGNOSIS — Z794 Long term (current) use of insulin: Secondary | ICD-10-CM | POA: Diagnosis not present

## 2019-05-29 DIAGNOSIS — K219 Gastro-esophageal reflux disease without esophagitis: Secondary | ICD-10-CM | POA: Diagnosis not present

## 2019-05-29 DIAGNOSIS — Z87891 Personal history of nicotine dependence: Secondary | ICD-10-CM | POA: Diagnosis not present

## 2019-05-29 DIAGNOSIS — G4733 Obstructive sleep apnea (adult) (pediatric): Secondary | ICD-10-CM | POA: Diagnosis not present

## 2019-05-29 DIAGNOSIS — I10 Essential (primary) hypertension: Secondary | ICD-10-CM | POA: Diagnosis not present

## 2019-05-29 DIAGNOSIS — F419 Anxiety disorder, unspecified: Secondary | ICD-10-CM | POA: Diagnosis not present

## 2019-05-29 DIAGNOSIS — J342 Deviated nasal septum: Secondary | ICD-10-CM | POA: Diagnosis present

## 2019-05-29 DIAGNOSIS — E119 Type 2 diabetes mellitus without complications: Secondary | ICD-10-CM | POA: Diagnosis not present

## 2019-05-29 DIAGNOSIS — Z01812 Encounter for preprocedural laboratory examination: Secondary | ICD-10-CM | POA: Insufficient documentation

## 2019-05-29 DIAGNOSIS — F319 Bipolar disorder, unspecified: Secondary | ICD-10-CM | POA: Diagnosis not present

## 2019-05-29 DIAGNOSIS — Z1159 Encounter for screening for other viral diseases: Secondary | ICD-10-CM | POA: Insufficient documentation

## 2019-05-29 DIAGNOSIS — M1990 Unspecified osteoarthritis, unspecified site: Secondary | ICD-10-CM | POA: Diagnosis not present

## 2019-05-29 DIAGNOSIS — J343 Hypertrophy of nasal turbinates: Secondary | ICD-10-CM | POA: Diagnosis not present

## 2019-05-29 LAB — CBC
HCT: 43.3 % (ref 39.0–52.0)
Hemoglobin: 14.6 g/dL (ref 13.0–17.0)
MCH: 32.4 pg (ref 26.0–34.0)
MCHC: 33.7 g/dL (ref 30.0–36.0)
MCV: 96.2 fL (ref 80.0–100.0)
Platelets: 251 10*3/uL (ref 150–400)
RBC: 4.5 MIL/uL (ref 4.22–5.81)
RDW: 14 % (ref 11.5–15.5)
WBC: 10.2 10*3/uL (ref 4.0–10.5)
nRBC: 0 % (ref 0.0–0.2)

## 2019-05-29 LAB — BASIC METABOLIC PANEL
Anion gap: 4 — ABNORMAL LOW (ref 5–15)
BUN: 13 mg/dL (ref 6–20)
CO2: 30 mmol/L (ref 22–32)
Calcium: 9.8 mg/dL (ref 8.9–10.3)
Chloride: 105 mmol/L (ref 98–111)
Creatinine, Ser: 1.18 mg/dL (ref 0.61–1.24)
GFR calc Af Amer: >60 mL/min (ref >60)
GFR calc non Af Amer: >60 mL/min (ref >60)
Glucose, Bld: 164 mg/dL — ABNORMAL HIGH (ref 70–99)
Potassium: 4.3 mmol/L (ref 3.5–5.1)
Sodium: 139 mmol/L (ref 135–145)

## 2019-05-29 LAB — HEMOGLOBIN A1C
Hgb A1c MFr Bld: 6 % — ABNORMAL HIGH (ref 4.8–5.6)
Mean Plasma Glucose: 125.5 mg/dL

## 2019-05-29 LAB — GLUCOSE, CAPILLARY: Glucose-Capillary: 159 mg/dL — ABNORMAL HIGH (ref 70–99)

## 2019-05-29 NOTE — Pre-Procedure Instructions (Signed)
Gary Grimes  05/29/2019      [id number] - [address] [address], [place] -  [address] [place] [address] Phone: [phone] Fax: [phone]    Your procedure is scheduled on May 31, 2019.  Report to [facility] at 800 AM.  Call this number if you have problems the morning of surgery:  [phone]   Remember:  Do not eat or drink after midnight.    Take these medicines the morning of surgery with A SIP OF WATER Dicyclomine (Bentyl) Omeprazole (prilosec) pregabalin (lyrica)   Follow your surgeon's instructions on when to hold/resume Eliquis.  If no instructions were given call the office to determine how they would like to you take Eliquis  7 days prior to surgery STOP taking any Aspirin (unless otherwise instructed by your surgeon), Aleve, Naproxen, Ibuprofen, Motrin, Advil, Goody's, BC's, all herbal medications, fish oil, and all vitamins  WHAT DO I DO ABOUT MY DIABETES MEDICATION?  Marland Kitchen Do not take oral diabetes medicines (pills) the morning of surgery-pioglitazone (Actos).  . THE NIGHT BEFORE SURGERY, take 10 units of NPH (Humulin 70/30) insulin. (70% of your normal dose of 14 units)     THE MORNING OF SURGERY, take NO INSULIN.  Marland Kitchen The day of surgery, do not take other diabetes injectables, including Byetta (exenatide), Bydureon (exenatide ER), Victoza (liraglutide), or Trulicity (dulaglutide).   Reviewed and Endorsed by [facility] Patient Education Committee, August 2015  How to Manage Your Diabetes Before and After Surgery  Why is it important to control my blood sugar before and after surgery? . Improving blood sugar levels before and after surgery helps healing and can limit problems. . A way of improving blood sugar control is eating a healthy diet by: o  Eating less sugar and carbohydrates o  Increasing activity/exercise o  Talking with your doctor about reaching your blood sugar goals . High blood sugars (greater than  180 mg/dL) can raise your risk of infections and slow your recovery, so you will need to focus on controlling your diabetes during the weeks before surgery. . Make sure that the doctor who takes care of your diabetes knows about your planned surgery including the date and location.  How do I manage my blood sugar before surgery? . Check your blood sugar at least 4 times a day, starting 2 days before surgery, to make sure that the level is not too high or low. o Check your blood sugar the morning of your surgery when you wake up and every 2 hours until you get to the Short Stay unit. . If your blood sugar is less than 70 mg/dL, you will need to treat for low blood sugar: o Do not take insulin. o Treat a low blood sugar (less than 70 mg/dL) with  cup of clear juice (cranberry or apple), 4 glucose tablets, OR glucose gel. Recheck blood sugar in 15 minutes after treatment (to make sure it is greater than 70 mg/dL). If your blood sugar is not greater than 70 mg/dL on recheck, call [phone] o  for further instructions. . Report your blood sugar to the short stay nurse when you get to Short Stay.  . If you are admitted to the hospital after surgery: o Your blood sugar will be checked by the staff and you will probably be given insulin after surgery (instead of oral diabetes medicines) to make sure you have good blood sugar levels. o The goal for blood sugar control after surgery  is 80-180 mg/dL.   [place]- Preparing For Surgery  Before surgery, you can play an important role. Because skin is not sterile, your skin needs to be as free of germs as possible. You can reduce the number of germs on your skin by washing with CHG (chlorahexidine gluconate) Soap before surgery.  CHG is an antiseptic cleaner which kills germs and bonds with the skin to continue killing germs even after washing.    Oral Hygiene is also important to reduce your risk of infection.  Remember - BRUSH YOUR TEETH THE MORNING  OF SURGERY WITH YOUR REGULAR TOOTHPASTE  Please do not use if you have an allergy to CHG or antibacterial soaps. If your skin becomes reddened/irritated stop using the CHG.  Do not shave (including legs and underarms) for at least 48 hours prior to first CHG shower. It is OK to shave your face.  Please follow these instructions carefully.   1. Shower the NIGHT BEFORE SURGERY and the MORNING OF SURGERY with CHG.   2. If you chose to wash your hair, wash your hair first as usual with your normal shampoo.  3. After you shampoo, rinse your hair and body thoroughly to remove the shampoo.  4. Use CHG as you would any other liquid soap. You can apply CHG directly to the skin and wash gently with a scrungie or a clean washcloth.   5. Apply the CHG Soap to your body ONLY FROM THE NECK DOWN.  Do not use on open wounds or open sores. Avoid contact with your eyes, ears, mouth and genitals (private parts). Wash Face and genitals (private parts)  with your normal soap.  6. Wash thoroughly, paying special attention to the area where your surgery will be performed.  7. Thoroughly rinse your body with warm water from the neck down.  8. DO NOT shower/wash with your normal soap after using and rinsing off the CHG Soap.  9. Pat yourself dry with a CLEAN TOWEL.  10. Wear CLEAN PAJAMAS to bed the night before surgery, wear comfortable clothes the morning of surgery  11. Place CLEAN SHEETS on your bed the night of your first shower and DO NOT SLEEP WITH PETS.   Day of Surgery:  Do not apply any deodorants/lotions.  Please wear clean clothes to the hospital/surgery center.   Remember to brush your teeth WITH YOUR REGULAR TOOTHPASTE.    Do not wear jewelry  Do not wear lotions, powders, or colognes, or deodorant.  Men may shave face and neck.  Do not bring valuables to the hospital.  [facility] is not responsible for any belongings or valuables.  Contacts, dentures or bridgework may not be worn  into surgery.  Leave your suitcase in the car.  After surgery it may be brought to your room.  For patients admitted to the hospital, discharge time will be determined by your treatment team.  Patients discharged the day of surgery will not be allowed to drive home.   Please read over the following fact sheets that you were given.

## 2019-05-29 NOTE — Progress Notes (Addendum)
PCP - Shirline Frees, MD Cardiologist - Jyl Heinz, MD  Chest x-ray - 06/06/2018 in [facility] EKG - 05/10/2019  Stress Test - pt denies recently ECHO - 06/28/2018  Cardiac Cath - 06/08/2018  Sleep Study - Yes CPAP - Yes  Fasting Blood Sugar - 140s Checks Blood Sugar 2 times a day  Blood Thinner Instructions: Eliquis 05/28/2019 Aspirin Instructions: n/a  Anesthesia review:  Yes, heart hx  Patient denies shortness of breath, fever, cough and chest pain at PAT appointment  Patient verbalized understanding of instructions that were given to them at the PAT appointment. Patient was also instructed that they will need to review over the PAT instructions again at home before surgery.   Coronavirus Screening  Have you experienced the following symptoms:  Cough yes/no: No Fever (>100.69F)  yes/no: No Runny nose yes/no: No Sore throat yes/no: No Difficulty breathing/shortness of breath  yes/no: No  Have you or a family member traveled in the last 14 days and where? yes/no: No

## 2019-05-30 ENCOUNTER — Telehealth: Payer: Self-pay | Admitting: Podiatry

## 2019-05-30 NOTE — Telephone Encounter (Signed)
Patient called and stated that he is experiencing severe pain due to his neuropathy and is currently taking Lyrica and it doesn't seem to be helping anymore. The pt said that he has started doubling up on the Lyrica and is almost out of the prescription now and will need a refill.  Pt did say that he is having surgery on his sinuses tomorrow so he will be unavailable for an in office visit.

## 2019-05-30 NOTE — Anesthesia Preprocedure Evaluation (Addendum)
Anesthesia Evaluation  Patient identified by MRN, date of birth, ID band Patient awake    Reviewed: Allergy & Precautions, NPO status , Patient's Chart, lab work & pertinent test results  Airway Mallampati: II  TM Distance: >3 FB Neck ROM: Full    Dental   Pulmonary sleep apnea , former smoker,    Pulmonary exam normal        Cardiovascular hypertension, Pt. on medications Normal cardiovascular exam     Neuro/Psych Anxiety Depression Bipolar Disorder    GI/Hepatic GERD  Medicated and Controlled,  Endo/Other  diabetes, Type 2, Insulin Dependent, Oral Hypoglycemic Agents  Renal/GU      Musculoskeletal   Abdominal   Peds  Hematology   Anesthesia Other Findings   Reproductive/Obstetrics                            Anesthesia Physical Anesthesia Plan  ASA: III  Anesthesia Plan: General   Post-op Pain Management:    Induction: Intravenous  PONV Risk Score and Plan: 2 and Ondansetron, Dexamethasone and Midazolam  Airway Management Planned: Oral ETT  Additional Equipment:   Intra-op Plan:   Post-operative Plan: Extubation in OR  Informed Consent: I have reviewed the patients History and Physical, chart, labs and discussed the procedure including the risks, benefits and alternatives for the proposed anesthesia with the patient or authorized representative who has indicated his/her understanding and acceptance.       Plan Discussed with: CRNA and Surgeon  Anesthesia Plan Comments: (Follows with Dr. [name] for afib s/p ablation 10/2018 and mild nonobstructive CAD. Cleared for surgery 05/17/19 stating "Preoperative cardiovascular stratification: The patient has mild coronary artery disease and is on anticoagulation for atrial fibrillation.  His effort tolerance is good and based on disease noted high risk for coronary events during the procedure she is planning to go namely colonoscopy and  nasal surgery.  I told him to continue walking a mile a day on a daily basis and he is agreeable.  Meticulous hemodynamic monitoring will further reduce the risk of coronary events."  TTE 06/28/18: Study Conclusions  - Left ventricle: The cavity size was normal. Wall thickness was   normal. Systolic function was normal. The estimated ejection   fraction was in the range of 60% to 65%. Wall motion was normal;   there were no regional wall motion abnormalities. - Left atrium: The atrium was mildly dilated.  Impressions:  - 1. Left ventricular systolic function is preserved visually   estimated ejection fraction 60 to 65%.   2. Mild left atrial enlargement.  LHC 06/08/18:  Minimal - non-obstructive CAD  LV end diastolic pressure is moderately elevated.  LVEF ~ 55-65% - Normal with no Regional Wall Motion Abnormalities   Essentially normal cardiac catheterization.)       Anesthesia Quick Evaluation

## 2019-05-31 ENCOUNTER — Ambulatory Visit (HOSPITAL_COMMUNITY): Payer: No Typology Code available for payment source | Admitting: Certified Registered"

## 2019-05-31 ENCOUNTER — Ambulatory Visit (HOSPITAL_COMMUNITY): Payer: No Typology Code available for payment source | Admitting: Physician Assistant

## 2019-05-31 ENCOUNTER — Other Ambulatory Visit: Payer: Self-pay

## 2019-05-31 ENCOUNTER — Encounter (HOSPITAL_COMMUNITY): Payer: Self-pay | Admitting: *Deleted

## 2019-05-31 ENCOUNTER — Encounter (HOSPITAL_COMMUNITY)
Admission: RE | Disposition: A | Discharge status: Home / Self Care | Payer: Self-pay | Source: Home / Self Care | Attending: Otolaryngology

## 2019-05-31 ENCOUNTER — Ambulatory Visit (HOSPITAL_COMMUNITY)
Admission: RE | Admit: 2019-05-31 | Discharge: 2019-06-01 | Disposition: A | Discharge status: Home / Self Care | Payer: No Typology Code available for payment source | Attending: Otolaryngology | Admitting: Otolaryngology

## 2019-05-31 DIAGNOSIS — Z9889 Other specified postprocedural states: Secondary | ICD-10-CM

## 2019-05-31 DIAGNOSIS — J342 Deviated nasal septum: Secondary | ICD-10-CM

## 2019-05-31 DIAGNOSIS — J343 Hypertrophy of nasal turbinates: Secondary | ICD-10-CM | POA: Insufficient documentation

## 2019-05-31 DIAGNOSIS — G4733 Obstructive sleep apnea (adult) (pediatric): Secondary | ICD-10-CM | POA: Insufficient documentation

## 2019-05-31 DIAGNOSIS — F319 Bipolar disorder, unspecified: Secondary | ICD-10-CM | POA: Insufficient documentation

## 2019-05-31 DIAGNOSIS — Z794 Long term (current) use of insulin: Secondary | ICD-10-CM | POA: Insufficient documentation

## 2019-05-31 DIAGNOSIS — M1990 Unspecified osteoarthritis, unspecified site: Secondary | ICD-10-CM | POA: Insufficient documentation

## 2019-05-31 DIAGNOSIS — F419 Anxiety disorder, unspecified: Secondary | ICD-10-CM | POA: Insufficient documentation

## 2019-05-31 DIAGNOSIS — K219 Gastro-esophageal reflux disease without esophagitis: Secondary | ICD-10-CM | POA: Insufficient documentation

## 2019-05-31 DIAGNOSIS — Z1159 Encounter for screening for other viral diseases: Secondary | ICD-10-CM | POA: Insufficient documentation

## 2019-05-31 DIAGNOSIS — Z87891 Personal history of nicotine dependence: Secondary | ICD-10-CM | POA: Insufficient documentation

## 2019-05-31 DIAGNOSIS — E119 Type 2 diabetes mellitus without complications: Secondary | ICD-10-CM | POA: Insufficient documentation

## 2019-05-31 DIAGNOSIS — I10 Essential (primary) hypertension: Secondary | ICD-10-CM | POA: Insufficient documentation

## 2019-05-31 HISTORY — DX: Other specified postprocedural states: Z98.890

## 2019-05-31 HISTORY — PX: NASAL SEPTOPLASTY W/ TURBINOPLASTY: SHX2070

## 2019-05-31 LAB — GLUCOSE, CAPILLARY
Glucose-Capillary: 198 mg/dL — ABNORMAL HIGH (ref 70–99)
Glucose-Capillary: 183 mg/dL — ABNORMAL HIGH (ref 70–99)
Glucose-Capillary: 215 mg/dL — ABNORMAL HIGH (ref 70–99)
Glucose-Capillary: 298 mg/dL — ABNORMAL HIGH (ref 70–99)
Glucose-Capillary: 300 mg/dL — ABNORMAL HIGH (ref 70–99)

## 2019-05-31 LAB — NOVEL CORONAVIRUS, NAA (HOSP ORDER, SEND-OUT TO REF LAB; TAT 18-24 HRS): SARS-CoV-2, NAA: NOT DETECTED

## 2019-05-31 LAB — PROTIME-INR
INR: 1 (ref 0.8–1.2)
Prothrombin Time: 13.1 seconds (ref 11.4–15.2)

## 2019-05-31 SURGERY — SEPTOPLASTY, NOSE, WITH NASAL TURBINATE REDUCTION
Anesthesia: General | Site: Nose | Laterality: Bilateral

## 2019-05-31 MED ORDER — OXYMETAZOLINE HCL 0.05 % NA SOLN
NASAL | Status: DC | PRN
  Administered 2019-05-31: 1

## 2019-05-31 MED ORDER — MIDAZOLAM HCL 5 MG/5ML IJ SOLN
INTRAMUSCULAR | Status: DC | PRN
  Administered 2019-05-31: 2 mg via INTRAVENOUS

## 2019-05-31 MED ORDER — ONDANSETRON HCL 4 MG/2ML IJ SOLN: 4.0000 mg | INTRAMUSCULAR | Status: DC | PRN

## 2019-05-31 MED ORDER — PREGABALIN 75 MG PO CAPS
75.0000 mg | ORAL_CAPSULE | Freq: Two times a day (BID) | ORAL | Status: DC
  Administered 2019-05-31 – 2019-06-01 (×3): 75 mg via ORAL
  Filled 2019-05-31 (×3): qty 1

## 2019-05-31 MED ORDER — INSULIN ASPART 100 UNIT/ML ~~LOC~~ SOLN
0.0000 [IU] | Freq: Every day | SUBCUTANEOUS | Status: DC
  Administered 2019-05-31: 3 [IU] via SUBCUTANEOUS

## 2019-05-31 MED ORDER — BACITRACIN ZINC 500 UNIT/GM EX OINT
TOPICAL_OINTMENT | CUTANEOUS | Status: DC | PRN
  Administered 2019-05-31: 1 via TOPICAL

## 2019-05-31 MED ORDER — AMOXICILLIN 875 MG PO TABS: 875.0000 mg | ORAL_TABLET | Freq: Two times a day (BID) | ORAL | 0 refills | Status: AC

## 2019-05-31 MED ORDER — LORAZEPAM 1 MG PO TABS
1.0000 mg | ORAL_TABLET | Freq: Every day | ORAL | Status: DC
  Administered 2019-05-31: 22:00:00 1 mg via ORAL
  Filled 2019-05-31: qty 2

## 2019-05-31 MED ORDER — FENTANYL CITRATE (PF) 250 MCG/5ML IJ SOLN
INTRAMUSCULAR | Status: DC | PRN
  Administered 2019-05-31 (×3): 50 ug via INTRAVENOUS

## 2019-05-31 MED ORDER — DEXTROSE 5 % IV SOLN
INTRAVENOUS | Status: DC | PRN
  Administered 2019-05-31: 3 g via INTRAVENOUS

## 2019-05-31 MED ORDER — PROPOFOL 10 MG/ML IV BOLUS
INTRAVENOUS | Status: DC | PRN
  Administered 2019-05-31: 120 mg via INTRAVENOUS

## 2019-05-31 MED ORDER — LIDOCAINE-EPINEPHRINE 1 %-1:100000 IJ SOLN
INTRAMUSCULAR | Status: DC | PRN
  Administered 2019-05-31: 2 mL

## 2019-05-31 MED ORDER — INSULIN ASPART 100 UNIT/ML ~~LOC~~ SOLN
0.0000 [IU] | Freq: Three times a day (TID) | SUBCUTANEOUS | Status: DC
  Administered 2019-05-31 – 2019-06-01 (×2): 5 [IU] via SUBCUTANEOUS

## 2019-05-31 MED ORDER — LIDOCAINE-EPINEPHRINE 1 %-1:100000 IJ SOLN
INTRAMUSCULAR | Status: AC
  Filled 2019-05-31: qty 1

## 2019-05-31 MED ORDER — DEXAMETHASONE SODIUM PHOSPHATE 10 MG/ML IJ SOLN
INTRAMUSCULAR | Status: DC | PRN
  Administered 2019-05-31: 5 mg via INTRAVENOUS

## 2019-05-31 MED ORDER — OXYMETAZOLINE HCL 0.05 % NA SOLN
NASAL | Status: AC
  Filled 2019-05-31: qty 30

## 2019-05-31 MED ORDER — BACITRACIN ZINC 500 UNIT/GM EX OINT
TOPICAL_OINTMENT | CUTANEOUS | Status: AC
  Filled 2019-05-31: qty 28.35

## 2019-05-31 MED ORDER — ONDANSETRON HCL 4 MG PO TABS: 4.0000 mg | ORAL_TABLET | ORAL | Status: DC | PRN

## 2019-05-31 MED ORDER — OXYCODONE-ACETAMINOPHEN 5-325 MG PO TABS
1.0000 | ORAL_TABLET | ORAL | Status: DC | PRN
  Administered 2019-05-31 – 2019-06-01 (×3): 2 via ORAL
  Filled 2019-05-31 (×3): qty 2

## 2019-05-31 MED ORDER — INSULIN NPH (HUMAN) (ISOPHANE) 100 UNIT/ML ~~LOC~~ SUSP
14.0000 [IU] | Freq: Two times a day (BID) | SUBCUTANEOUS | Status: DC
  Filled 2019-05-31: qty 10

## 2019-05-31 MED ORDER — SUGAMMADEX SODIUM 200 MG/2ML IV SOLN
INTRAVENOUS | Status: DC | PRN
  Administered 2019-05-31: 250 mg via INTRAVENOUS

## 2019-05-31 MED ORDER — TRAZODONE HCL 100 MG PO TABS
100.0000 mg | ORAL_TABLET | Freq: Every day | ORAL | Status: DC
  Administered 2019-05-31: 22:00:00 100 mg via ORAL
  Filled 2019-05-31: qty 1

## 2019-05-31 MED ORDER — OXYCODONE-ACETAMINOPHEN 5-325 MG PO TABS: 1.0000 | ORAL_TABLET | ORAL | 0 refills | Status: AC | PRN

## 2019-05-31 MED ORDER — LACTATED RINGERS IV SOLN
INTRAVENOUS | Status: DC
  Administered 2019-05-31: 09:00:00 via INTRAVENOUS

## 2019-05-31 MED ORDER — LITHIUM CARBONATE ER 450 MG PO TBCR
900.0000 mg | EXTENDED_RELEASE_TABLET | Freq: Every day | ORAL | Status: DC
  Administered 2019-05-31: 22:00:00 900 mg via ORAL
  Filled 2019-05-31: qty 2

## 2019-05-31 MED ORDER — 0.9 % SODIUM CHLORIDE (POUR BTL) OPTIME
TOPICAL | Status: DC | PRN
  Administered 2019-05-31: 1000 mL

## 2019-05-31 MED ORDER — LOSARTAN POTASSIUM 50 MG PO TABS
100.0000 mg | ORAL_TABLET | Freq: Every day | ORAL | Status: DC
  Administered 2019-05-31 – 2019-06-01 (×2): 100 mg via ORAL
  Filled 2019-05-31 (×2): qty 2

## 2019-05-31 MED ORDER — MIDAZOLAM HCL 2 MG/2ML IJ SOLN
INTRAMUSCULAR | Status: AC
  Filled 2019-05-31: qty 2

## 2019-05-31 MED ORDER — ONDANSETRON HCL 4 MG/2ML IJ SOLN
INTRAMUSCULAR | Status: DC | PRN
  Administered 2019-05-31: 4 mg via INTRAVENOUS

## 2019-05-31 MED ORDER — PROPOFOL 10 MG/ML IV BOLUS
INTRAVENOUS | Status: AC
  Filled 2019-05-31: qty 20

## 2019-05-31 MED ORDER — PIOGLITAZONE HCL 30 MG PO TABS
30.0000 mg | ORAL_TABLET | Freq: Every day | ORAL | Status: DC
  Administered 2019-06-01: 30 mg via ORAL
  Filled 2019-05-31: qty 1

## 2019-05-31 MED ORDER — INSULIN NPH ISOPHANE & REGULAR (70-30) 100 UNIT/ML ~~LOC~~ SUSP: 14.0000 [IU] | Freq: Two times a day (BID) | SUBCUTANEOUS | Status: DC

## 2019-05-31 MED ORDER — PANTOPRAZOLE SODIUM 40 MG PO TBEC
80.0000 mg | DELAYED_RELEASE_TABLET | Freq: Every day | ORAL | Status: DC
  Administered 2019-05-31 – 2019-06-01 (×2): 80 mg via ORAL
  Filled 2019-05-31 (×2): qty 2

## 2019-05-31 MED ORDER — ROCURONIUM BROMIDE 10 MG/ML (PF) SYRINGE
PREFILLED_SYRINGE | INTRAVENOUS | Status: DC | PRN
  Administered 2019-05-31: 60 mg via INTRAVENOUS

## 2019-05-31 MED ORDER — KCL IN DEXTROSE-NACL 20-5-0.45 MEQ/L-%-% IV SOLN
INTRAVENOUS | Status: DC
  Administered 2019-05-31: 14:00:00 via INTRAVENOUS
  Filled 2019-05-31: qty 1000

## 2019-05-31 MED ORDER — INSULIN ASPART PROT & ASPART (70-30 MIX) 100 UNIT/ML ~~LOC~~ SUSP
14.0000 [IU] | Freq: Two times a day (BID) | SUBCUTANEOUS | Status: DC
  Administered 2019-05-31 – 2019-06-01 (×2): 14 [IU] via SUBCUTANEOUS
  Filled 2019-05-31: qty 10

## 2019-05-31 MED ORDER — SUCCINYLCHOLINE CHLORIDE 200 MG/10ML IV SOSY
PREFILLED_SYRINGE | INTRAVENOUS | Status: DC | PRN
  Administered 2019-05-31: 160 mg via INTRAVENOUS

## 2019-05-31 MED ORDER — LIDOCAINE 2% (20 MG/ML) 5 ML SYRINGE
INTRAMUSCULAR | Status: DC | PRN
  Administered 2019-05-31: 100 mg via INTRAVENOUS

## 2019-05-31 MED ORDER — FENTANYL CITRATE (PF) 250 MCG/5ML IJ SOLN
INTRAMUSCULAR | Status: AC
  Filled 2019-05-31: qty 5

## 2019-05-31 SURGICAL SUPPLY — 31 items
BLADE TRICUT ROTATE M4 4 5PK (BLADE) ×1 IMPLANT
CANISTER SUCT 3000ML PPV (MISCELLANEOUS) ×2 IMPLANT
COAGULATOR SUCT SWTCH 10FR 6 (ELECTROSURGICAL) ×2 IMPLANT
COVER SURGICAL LIGHT HANDLE (MISCELLANEOUS) IMPLANT
COVER WAND RF STERILE (DRAPES) ×2 IMPLANT
DRAPE HALF SHEET 40X57 (DRAPES) IMPLANT
ELECT REM PT RETURN 9FT ADLT (ELECTROSURGICAL) ×2
ELECTRODE REM PT RTRN 9FT ADLT (ELECTROSURGICAL) ×1 IMPLANT
GAUZE SPONGE 2X2 8PLY STRL LF (GAUZE/BANDAGES/DRESSINGS) ×1 IMPLANT
GAUZE SPONGE 4X4 12PLY STRL (GAUZE/BANDAGES/DRESSINGS) ×1 IMPLANT
GLOVE ECLIPSE 7.5 STRL STRAW (GLOVE) ×2 IMPLANT
GOWN STRL REUS W/ TWL LRG LVL3 (GOWN DISPOSABLE) ×2 IMPLANT
GOWN STRL REUS W/TWL LRG LVL3 (GOWN DISPOSABLE) ×4
KIT BASIN OR (CUSTOM PROCEDURE TRAY) ×2 IMPLANT
KIT TURNOVER KIT B (KITS) ×2 IMPLANT
NDL HYPO 25GX1X1/2 BEV (NEEDLE) ×1 IMPLANT
NEEDLE HYPO 25GX1X1/2 BEV (NEEDLE) ×2 IMPLANT
NS IRRIG 1000ML POUR BTL (IV SOLUTION) ×2 IMPLANT
PAD ARMBOARD 7.5X6 YLW CONV (MISCELLANEOUS) ×4 IMPLANT
SPLINT NASAL DOYLE BI-VL (GAUZE/BANDAGES/DRESSINGS) ×2 IMPLANT
SPONGE GAUZE 2X2 STER 10/PKG (GAUZE/BANDAGES/DRESSINGS) ×1
SPONGE NEURO XRAY DETECT 1X3 (DISPOSABLE) ×2 IMPLANT
SUT CHROMIC 3 0 SH 27 (SUTURE) ×1 IMPLANT
SUT CHROMIC 4 0 SH 27 (SUTURE) ×1 IMPLANT
SUT PLAIN 4 0 ~~LOC~~ 1 (SUTURE) ×2 IMPLANT
SUT PROLENE 2 0 FS (SUTURE) ×1 IMPLANT
SUT PROLENE 3 0 PS 2 (SUTURE) ×2 IMPLANT
TRAY ENT MC OR (CUSTOM PROCEDURE TRAY) ×2 IMPLANT
TUBE CONNECTING 12X1/4 (SUCTIONS) ×1 IMPLANT
TUBE SALEM SUMP 16 FR W/ARV (TUBING) IMPLANT
TUBING EXTENTION W/L.L. (IV SETS) IMPLANT

## 2019-05-31 NOTE — Anesthesia Procedure Notes (Signed)
Procedure Name: Intubation Date/Time: 05/31/2019 10:03 AM Performed by: Imagene Riches, CRNA Pre-anesthesia Checklist: Patient identified, Emergency Drugs available, Suction available and Patient being monitored Patient Re-evaluated:Patient Re-evaluated prior to induction Oxygen Delivery Method: Circle System Utilized Preoxygenation: Pre-oxygenation with 100% oxygen Induction Type: IV induction Ventilation: Mask ventilation without difficulty Laryngoscope Size: Mac and 4 Grade View: Grade II Tube type: Oral Rae Tube size: 7.5 mm Number of attempts: 1 Airway Equipment and Method: Stylet and Oral airway Placement Confirmation: ETT inserted through vocal cords under direct vision,  positive ETCO2 and breath sounds checked- equal and bilateral Secured at: 21 cm Tube secured with: Tape Dental Injury: Teeth and Oropharynx as per pre-operative assessment

## 2019-05-31 NOTE — H&P (Signed)
Cc: Chronic nasal obstruction  HPI: The patient is a 58 year old male who presents today complaining of severe nasal congestion, facial pressure and facial pain.  The patient is seen in consultation requested by Dr. Mosetta Anis.  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  He underwent bilateral endoscopic sinus surgery and septoplasty 9 years ago in [place].  He was doing well until approximately 2 to 3 months ago, when he noted increasing nasal obstruction and facial pressure.  He was seen by his primary care physician, and was treated with 2 courses of oral antibiotics.  He continued to be symptomatic.  He was referred to see Dr. Janace Hoard.  He was placed on another course of antibiotics.  He subsequently underwent a sinus CT scan.  The CT showed no evidence of significant sinusitis.  Nasal septal deviation was noted on the CT scan.  The patient also underwent allergy evaluation by Dr. Donneta Romberg.  No significant allergy was noted.  The patient has a history of obstructive sleep apnea.  He is currently using CPAP at night.  The patient also has insulin dependent diabetes.  Currently the patient is on Dymista nasal spray daily.  He currently denies any purulent drainage, fever or visual change.   The patient's review of systems (constitutional, eyes, ENT, cardiovascular, respiratory, GI, musculoskeletal, skin, neurologic, psychiatric, endocrine, hematologic, allergic) is noted in the ROS questionnaire.  It is reviewed with the patient.   Family health history: No HTN, DM, CAD, hearing loss or bleeding disorder.  Major events: Appendectomy, hernia repair, shoulder surgery.  Ongoing medical problems: Diabetes, hypertension, depression, OSA on CPAP, arthritis, anxiety, depression, headaches, GERD.  Social history: The patient is single. He denies the use of tobacco, alcohol or illegal drugs.   Exam: General: Communicates without difficulty,  well nourished, no acute distress. Head: Normocephalic, no evidence injury, no tenderness, facial buttresses intact without stepoff. Face/sinus: No tenderness to palpation and percussion. Facial movement is normal and symmetric. Eyes: PERRL, EOMI. No scleral icterus, conjunctivae clear. Neuro: CN II exam reveals vision grossly intact.  No nystagmus at any point of gaze. Ears: Auricles well formed without lesions.  Ear canals are intact without mass or lesion.  No erythema or edema is appreciated.  The TMs are intact without fluid. Nose: External evaluation reveals normal support and skin without lesions.  Dorsum is intact.  Anterior rhinoscopy reveals congested mucosa over anterior aspect of inferior turbinates and deviated septum.  No purulence noted. Oral:  Oral cavity and oropharynx are intact, symmetric, without erythema or edema.  Mucosa is moist without lesions. Neck: Full range of motion without pain.  There is no significant lymphadenopathy.  No masses palpable.  Thyroid bed within normal limits to palpation.  Parotid glands and submandibular glands equal bilaterally without mass.  Trachea is midline. Neuro:  CN 2-12 grossly intact. Gait normal.   Procedure:  Flexible Nasal Endoscopy: Risks, benefits, and alternatives of flexible endoscopy were explained to the patient.  Specific mention was made of the risk of throat numbness with difficulty swallowing, possible bleeding from the nose and mouth, and pain from the procedure.  The patient gave oral consent to proceed.  The nasal cavities were decongested and anesthetised with a combination of oxymetazoline and 4% lidocaine solution.  The flexible scope was inserted into the right nasal cavity.  Endoscopy of the inferior and middle meatus was performed.  The edematous mucosa was as described above. NSD noted. The  sinus openings were patent. No polyp, mass, or lesion was appreciated.  Olfactory cleft was clear.  Nasopharynx was clear.  Turbinates were  hypertrophied but without mass.  Incomplete response to decongestion.  The procedure was repeated on the contralateral side with similar findings.  The patient tolerated the procedure well.  Instructions were given to avoid eating or drinking for 2 hours.   Assessment 1.  Chronic rhinitis with nasal mucosal congestion, nasal septal deviation, and bilateral inferior turbinate hypertrophy.  The obstruction is worse on the left side.  More than 95% of his nasal passageways are obstructed.  2.  The patient's sinus openings are noted to be patent bilaterally.  No significant sinusitis is noted on today's nasal endoscopy examination.   Plan  1.  The physical exam and nasal endoscopy findings are reviewed with the patient.  2.  The patient should continue with his Dymista nasal spray daily.  3.  Prednisone dosepak for 6 days.   4.  In light of his persistent symptoms, he may benefit from undergoing revision surgery with septoplasty and turbinate reduction.  The risks, benefits, and details of the procedure are reviewed with the patient.  Questions are invited and answered. The patient would like to proceed with the procedures.

## 2019-05-31 NOTE — Telephone Encounter (Signed)
Left message informing pt I would inform Dr. Jacqualyn Posey of his current increase of neuropathy type pain, and his increasing dosage of his Lyrica, that I could not change the dosage but would need instructions from Dr. Jacqualyn Posey. I also told pt if he was having increasing pain he would benefit from an appt.

## 2019-05-31 NOTE — Plan of Care (Signed)

## 2019-05-31 NOTE — Progress Notes (Addendum)
Inpatient Diabetes Program Recommendations  AACE/ADA: New Consensus Statement on Inpatient Glycemic Control (2015)  Target Ranges:  Prepandial:   less than 140 mg/dL      Peak postprandial:   less than 180 mg/dL (1-2 hours)      Critically ill patients:  140 - 180 mg/dL   Lab Results  Component Value Date   GLUCAP 183 (H) 05/31/2019   HGBA1C 6.0 (H) 05/29/2019    Review of Glycemic Control Results for Gary Grimes, Gary Grimes" (MRN [id number]) as of 05/31/2019 12:07  Ref. Range 05/29/2019 14:43 05/31/2019 08:17 05/31/2019 11:10  Glucose-Capillary Latest Ref Range: 70 - 99 mg/dL 159 (H) 198 (H) 183 (H)   Diabetes history: DM 2 Outpatient Diabetes medications: Humulin 70/30 14 units bid, Actos 30 mg daily, Ozempic 0.25 mg weekly Current orders for Inpatient glycemic control:  NPH 14 units bid Actos 30 mg  Inpatient Diabetes Program Recommendations:   Please consider adding Novolog sensitive correction tid with meals and HS. Also note that patient was on 70/30 insulin bid instead of NPH? Consider changing the NPH to 70/30 insulin?  Spoke with MD and orders received.   Thanks,  Adah Perl, RN, BC-ADM Inpatient Diabetes Coordinator Pager [phone] (8a-5p)

## 2019-05-31 NOTE — Op Note (Signed)
DATE OF PROCEDURE: 05/31/2019  OPERATIVE REPORT   SURGEON: Leta Baptist, MD   PREOPERATIVE DIAGNOSES:  1. Nasal septal deviation.  2. Bilateral inferior turbinate hypertrophy.  3. Chronic nasal obstruction.  POSTOPERATIVE DIAGNOSES:  1. N asal septal deviation.  2. Bilateral inferior turbinate hypertrophy.  3. Chronic nasal obstruction.  PROCEDURE PERFORMED:  1. Septoplasty.  2. Bilateral partial inferior turbinate resection.   ANESTHESIA: General endotracheal tube anesthesia.   COMPLICATIONS: None.   ESTIMATED BLOOD LOSS: 50 mL.   INDICATION FOR PROCEDURE: Gary Grimes is a 58 y.o. male with a history of chronic nasal obstruction. The patient was  treated with antihistamine, decongestant, and steroid nasal spray. However, the patient continued to be symptomatic. On examination, the patient was noted to have bilateral inferior turbinate hypertrophy and nasal septal deviation, causing significant nasal obstruction. Based on the above findings, the decision was made for the patient to undergo the above-stated procedures. The risks, benefits, alternatives, and details of the procedures were discussed with the patient. Questions were invited and answered. Informed consent was obtained.   DESCRIPTION OF PROCEDURE: The patient was taken to the operating room and placed supine on the operating table. General endotracheal tube anesthesia was administered by the anesthesiologist. The patient was positioned, and prepped and draped in the standard fashion for nasal surgery. Pledgets soaked with Afrin were placed in both nasal cavities for decongestion. The pledgets were subsequently removed.   Examination of the nasal cavity revealed nasal septal deviationd. 1% lidocaine with 1:100,000 epinephrine was injected onto the nasal septum bilaterally. A hemitransfixion incision was made on the left side. The mucosal flap was carefully elevated on the left side. A cartilaginous incision was made 1 cm superior  to the caudal margin of the nasal septum. Mucosal flap was also elevated on the right side in the similar fashion. It should be noted that due to the septal deviation, the deviated portion of the cartilaginous and bony septum had to be removed in piecemeal fashion. Once the deviated portions were removed, a straight midline septum was achieved. The septum was then quilted with 4-0 plain gut sutures.  The inferior one half of both hypertrophied inferior turbinate was crossclamped with a Kelly clamp. The inferior one half of each inferior turbinate was then resected with a pair of cross cutting scissors. Hemostasis was achieved with a suction cautery device. Doyle splints were applied to the nasal septum.  The care of the patient was turned over to the anesthesiologist. The patient was awakened from anesthesia without difficulty. The patient was extubated and transferred to the recovery room in good condition.   OPERATIVE FINDINGS: Nasal septal deviation and bilateral inferior turbinate hypertrophy.   SPECIMEN: None.   FOLLOWUP CARE: The patient be observed overnight due to his OSA. The patient will follow up in my office in approximately 1 week for splint removal.   Lylee Corrow Raynelle Bring, MD

## 2019-05-31 NOTE — Discharge Instructions (Signed)
POSTOPERATIVE INSTRUCTIONS FOR PATIENTS HAVING NASAL OR SINUS OPERATIONS °ACTIVITY: Restrict activity at home for the first two days, resting as much as possible. Light activity is best. You may usually return to work within a week. You should refrain from nose blowing, strenuous activity, or heavy lifting greater than 20lbs for a total of three weeks after your operation.  If sneezing cannot be avoided, sneeze with your mouth open. °DISCOMFORT: You may experience a dull headache and pressure along with nasal congestion and discharge. These symptoms may be worse during the first week after the operation but may last as long as two to four weeks.  Please take Tylenol or the pain medication that has been prescribed for you. Do not take aspirin or aspirin containing medications since they may cause bleeding.  You may experience symptoms of post nasal drainage, nasal congestion, headaches and fatigue for two or three months after your operation.  °BLEEDING: You may have some blood tinged nasal drainage for approximately two weeks after the operation.  The discharge will be worse for the first week.  Please call our office at [phone] or go to the nearest hospital emergency room if you experience any of the following: heavy, bright red blood from your nose or mouth that lasts longer than ten minutes or coughing up or vomiting bright red blood or blood clots. °GENERAL CONSIDERATIONS: °1. A gauze dressing will be placed on your upper lip to absorb any drainage after the operation. You may need to change this several times a day.  If you do not have very much drainage, you may remove the dressing.  Remember that you may gently wipe your nose with a tissue and sniff in, but DO NOT blow your nose. °2. Please keep all of your postoperative appointments.  Your final results after the operation will depend on proper follow-up.  The initial visit is usually four to seven days after the operation.  During this visit, the  remaining nasal packing and internal septal splints will be removed.  Your nasal and sinus cavities will be cleaned.  During the second visit, your nasal and sinus cavities will be cleaned again. Have someone drive you to your first two postoperative appointments. We suggest that you take your prescribed pain medication about ½ hour prior to each of these two appointments.  °3. How you care for your nose after the operation will influence the results that you obtain.  You should follow all directions, take your medication as prescribed, and call our office [phone] with any problems or questions. °4. You may be more comfortable sleeping with your head elevated on two pillows. °5. Do not take any medications that we have not prescribed or recommended. °WARNING SIGNS: if any of the following should occur, please call our office: °1. Bright red bleeding which lasts more than 10 minutes. °2. Persistent fever greater than 102F. °3. Persistent vomiting. °4. Severe and constant pain that is not relieved by prescribed pain medication. °5. Trauma to the nose. °6. Rash or unusual side effects from any medicines. ° °

## 2019-05-31 NOTE — Telephone Encounter (Signed)
Lets increase it to 150mg  BID and see how that does but I would like to see him in the office as soon as he can to evaluate. Thanks.

## 2019-05-31 NOTE — Anesthesia Postprocedure Evaluation (Signed)
Anesthesia Post Note  Patient: Gary Grimes  Procedure(s) Performed: NASAL SEPTOPLASTY WITH BILATERAL TURBINATE REDUCTION (Bilateral Nose)     Patient location during evaluation: PACU Anesthesia Type: General Level of consciousness: awake and alert Pain management: pain level controlled Vital Signs Assessment: post-procedure vital signs reviewed and stable Respiratory status: spontaneous breathing, nonlabored ventilation, respiratory function stable and patient connected to nasal cannula oxygen Cardiovascular status: blood pressure returned to baseline and stable Postop Assessment: no apparent nausea or vomiting Anesthetic complications: no    Last Vitals:  Vitals:   05/31/19 1150 05/31/19 1203  BP: (!) 147/83 (!) 156/95  Pulse: 63 64  Resp: 12 16  Temp: (!) 36.2 C (!) 36.3 C  SpO2: 94% 97%    Last Pain:  Vitals:   05/31/19 1338  TempSrc:   PainSc: 6                  Joselin Crandell DAVID

## 2019-05-31 NOTE — Transfer of Care (Signed)
Immediate Anesthesia Transfer of Care Note  Patient: Gary Grimes  Procedure(s) Performed: NASAL SEPTOPLASTY WITH BILATERAL TURBINATE REDUCTION (Bilateral Nose)  Patient Location: PACU  Anesthesia Type:General  Level of Consciousness: awake, alert  and oriented  Airway & Oxygen Therapy: Patient Spontanous Breathing and Patient connected to face mask oxygen  Post-op Assessment: Report given to RN and Post -op Vital signs reviewed and stable  Post vital signs: Reviewed and stable  Last Vitals:  Vitals Value Taken Time  BP    Temp    Pulse 73 05/31/2019 11:08 AM  Resp 12 05/31/2019 11:08 AM  SpO2 99 % 05/31/2019 11:08 AM  Vitals shown include unvalidated device data.  Last Pain:  Vitals:   05/31/19 0854  TempSrc:   PainSc: 0-No pain      Patients Stated Pain Goal: 3 (23/36/12 2449)  Complications: No apparent anesthesia complications

## 2019-06-01 ENCOUNTER — Encounter (HOSPITAL_COMMUNITY): Payer: Self-pay | Admitting: Otolaryngology

## 2019-06-01 DIAGNOSIS — J342 Deviated nasal septum: Secondary | ICD-10-CM | POA: Diagnosis not present

## 2019-06-01 LAB — GLUCOSE, CAPILLARY: Glucose-Capillary: 272 mg/dL — ABNORMAL HIGH (ref 70–99)

## 2019-06-01 NOTE — Discharge Summary (Signed)
Physician Discharge Summary  Patient ID: Gary Grimes MRN: [id number] DOB/AGE: 1960/12/29 58 y.o.  Admit date: 05/31/2019 Discharge date: 06/01/2019  Admission Diagnoses: Chronic nasal obstruction  Discharge Diagnoses: Chronic nasal obstruction Active Problems:   S/P nasal septoplasty   Discharged Condition: Good  Hospital Course: Pt had an uneventful overnight stay.  No bleeding. No stridor.  Consults: None  Significant Diagnostic Studies: None  Treatments: Septoplasty and turbinate reduction  Discharge Exam: Blood pressure 138/84, pulse 68, temperature 98.4 F (36.9 C), temperature source Oral, resp. rate 16, height 6' (1.829 m), weight (!) 139.3 kg, SpO2 96 %.    Disposition: Discharge disposition: 01-Home or Self Care       Discharge Instructions    Activity as tolerated - No restrictions   Complete by:  As directed    Diet general   Complete by:  As directed      Allergies as of 06/01/2019      Reactions   Morphine Other (See Comments)   PT BECAME DELIRIOUS       Medication List    STOP taking these medications   apixaban 5 MG Tabs tablet Commonly known as:  Eliquis     TAKE these medications   Actos 30 MG tablet Generic drug:  pioglitazone Take 30 mg by mouth every morning.   amoxicillin 875 MG tablet Commonly known as:  AMOXIL Take 1 tablet (875 mg total) by mouth 2 (two) times daily for 5 days.   dicyclomine 20 MG tablet Commonly known as:  BENTYL Take 1 tablet (20 mg total) by mouth 2 (two) times daily.   HUMULIN 70/30 [place] Inject 14 Units into the skin 2 (two) times daily.   lithium carbonate 300 MG CR tablet Commonly known as:  LITHOBID Take 900 mg by mouth at bedtime.   LORazepam 1 MG tablet Commonly known as:  ATIVAN Take 1 tablet by mouth at bedtime.   losartan 100 MG tablet Commonly known as:  COZAAR Take 1 tablet (100 mg total) by mouth every evening. What changed:  when to take this   nitroGLYCERIN 0.4 MG SL  tablet Commonly known as:  Nitrostat Place 1 tablet (0.4 mg total) under the tongue every 5 (five) minutes as needed for up to 25 days for chest pain.   omeprazole 40 MG capsule Commonly known as:  PRILOSEC Take 40 mg by mouth 2 (two) times a day.   oxyCODONE-acetaminophen 5-325 MG tablet Commonly known as:  PERCOCET/ROXICET Take 1 tablet by mouth every 4 (four) hours as needed for up to 5 days for severe pain.   OZEMPIC (1 MG/DOSE) [place] Inject 0.25 mg into the skin once a week. On saturday   pregabalin 75 MG capsule Commonly known as:  Lyrica Take 1 capsule (75 mg total) by mouth 2 (two) times daily.   testosterone cypionate 200 MG/ML injection Commonly known as:  DEPOTESTOSTERONE CYPIONATE Inject 1 mL into the muscle once a week.   traZODone 100 MG tablet Commonly known as:  DESYREL Take 1 tablet (100 mg total) by mouth at bedtime as needed for sleep. What changed:  when to take this   vitamin C 1000 MG tablet Take 4,000 mg by mouth daily.      Follow-up Information    Leta Baptist, MD On 06/05/2019.   Specialty:  Otolaryngology Why:  at 2:20pm Contact information: [address] [place] [address] [phone]           Signed: Jacquelin Hawking  Ulis Kaps 06/01/2019, 7:58 AM

## 2019-06-01 NOTE — Telephone Encounter (Signed)
I informed pt of Dr. Leigh Aurora orders and statement that we would change his lyrica to 150mg  bid, once he had recovered from his sinus surgery. Pt states understanding and will call next week.

## 2019-06-01 NOTE — Progress Notes (Signed)
Alert, able to make needs known. C/o soreness to nose. Up ad lib in room. Pt to discharge home in stable condition. Follow up with MD as prescribed.

## 2019-06-01 NOTE — Telephone Encounter (Signed)
Yes start that after he recovers from the sinus surgery and not right now. Thanks.

## 2019-06-05 ENCOUNTER — Ambulatory Visit (INDEPENDENT_AMBULATORY_CARE_PROVIDER_SITE_OTHER): Payer: No Typology Code available for payment source | Admitting: Otolaryngology

## 2019-06-14 ENCOUNTER — Other Ambulatory Visit: Payer: Self-pay | Admitting: Cardiology

## 2019-06-14 MED ORDER — APIXABAN 5 MG PO TABS: 5.0000 mg | ORAL_TABLET | Freq: Two times a day (BID) | ORAL | 1 refills | Status: DC

## 2019-06-14 NOTE — Telephone Encounter (Signed)
°  Patient was given samples prior but has insurance now   1. Which medications need to be refilled? (please list name of each medication and dose if known) Eliquis  2. Which pharmacy/location (including street and city if local pharmacy) is medication to be sent to?[facility] on [address]  3. Do they need a 30 day or 90 day supply? [place]

## 2019-06-14 NOTE — Telephone Encounter (Signed)
Called patient to confirm that he is still currently taking eliquis 5 mg twice daily as it was not listed on his medication list. Patient states he is taking this medication as prescribed. He had stopped eliquis briefly after his procedure on 05/31/2019 but restarted as instructed by the hospital on 06/02/2019. Prescription [facility] to [facility] [place] as requested. No further questions.

## 2019-06-19 ENCOUNTER — Ambulatory Visit (INDEPENDENT_AMBULATORY_CARE_PROVIDER_SITE_OTHER): Payer: No Typology Code available for payment source | Admitting: Otolaryngology

## 2019-06-20 ENCOUNTER — Other Ambulatory Visit (HOSPITAL_COMMUNITY): Payer: Self-pay | Admitting: Gastroenterology

## 2019-06-20 ENCOUNTER — Other Ambulatory Visit: Payer: Self-pay | Admitting: Gastroenterology

## 2019-06-20 DIAGNOSIS — R1013 Epigastric pain: Secondary | ICD-10-CM

## 2019-06-29 ENCOUNTER — Ambulatory Visit (INDEPENDENT_AMBULATORY_CARE_PROVIDER_SITE_OTHER): Payer: No Typology Code available for payment source | Admitting: Otolaryngology

## 2019-06-29 ENCOUNTER — Encounter (HOSPITAL_COMMUNITY)
Admission: RE | Admit: 2019-06-29 | Discharge: 2019-06-29 | Disposition: A | Discharge status: Home / Self Care | Payer: No Typology Code available for payment source | Source: Ambulatory Visit | Attending: Gastroenterology | Admitting: Gastroenterology

## 2019-06-29 ENCOUNTER — Other Ambulatory Visit: Payer: Self-pay

## 2019-06-29 DIAGNOSIS — R1013 Epigastric pain: Secondary | ICD-10-CM | POA: Diagnosis present

## 2019-06-29 MED ORDER — TECHNETIUM TC 99M SULFUR COLLOID
2.0000 | Freq: Once | INTRAVENOUS | Status: AC | PRN
  Administered 2019-06-29: 11:00:00 2 via ORAL

## 2019-07-06 ENCOUNTER — Ambulatory Visit (INDEPENDENT_AMBULATORY_CARE_PROVIDER_SITE_OTHER): Payer: No Typology Code available for payment source | Admitting: Otolaryngology

## 2019-07-31 ENCOUNTER — Other Ambulatory Visit: Payer: Self-pay | Admitting: Gastroenterology

## 2019-08-07 ENCOUNTER — Other Ambulatory Visit (HOSPITAL_COMMUNITY)
Admission: RE | Admit: 2019-08-07 | Discharge: 2019-08-07 | Disposition: A | Discharge status: Home / Self Care | Payer: No Typology Code available for payment source | Source: Ambulatory Visit | Attending: Gastroenterology | Admitting: Gastroenterology

## 2019-08-07 DIAGNOSIS — Z01812 Encounter for preprocedural laboratory examination: Secondary | ICD-10-CM | POA: Insufficient documentation

## 2019-08-07 DIAGNOSIS — Z20828 Contact with and (suspected) exposure to other viral communicable diseases: Secondary | ICD-10-CM | POA: Diagnosis not present

## 2019-08-07 LAB — SARS CORONAVIRUS 2 (TAT 6-24 HRS): SARS Coronavirus 2: NEGATIVE

## 2019-08-08 ENCOUNTER — Telehealth: Payer: Self-pay | Admitting: Podiatry

## 2019-08-08 NOTE — Telephone Encounter (Signed)
Pt called requesting a refill on his Lyrica. I instructed pt to contact his pharmacy so they can contact our office.

## 2019-08-09 ENCOUNTER — Encounter (HOSPITAL_COMMUNITY): Payer: Self-pay | Admitting: *Deleted

## 2019-08-09 ENCOUNTER — Other Ambulatory Visit: Payer: Self-pay

## 2019-08-09 NOTE — Progress Notes (Signed)
Called and left patient voice mail message to return rn call for medical history for 08-10-19 colonscopy

## 2019-08-10 ENCOUNTER — Ambulatory Visit (HOSPITAL_COMMUNITY): Payer: No Typology Code available for payment source | Admitting: Anesthesiology

## 2019-08-10 ENCOUNTER — Encounter (HOSPITAL_COMMUNITY): Payer: Self-pay | Admitting: Certified Registered Nurse Anesthetist

## 2019-08-10 ENCOUNTER — Encounter (HOSPITAL_COMMUNITY)
Admission: RE | Disposition: A | Discharge status: Home / Self Care | Payer: Self-pay | Source: Home / Self Care | Attending: Gastroenterology

## 2019-08-10 ENCOUNTER — Other Ambulatory Visit: Payer: Self-pay

## 2019-08-10 ENCOUNTER — Ambulatory Visit (HOSPITAL_COMMUNITY)
Admission: RE | Admit: 2019-08-10 | Discharge: 2019-08-10 | Disposition: A | Discharge status: Home / Self Care | Payer: No Typology Code available for payment source | Attending: Gastroenterology | Admitting: Gastroenterology

## 2019-08-10 DIAGNOSIS — F319 Bipolar disorder, unspecified: Secondary | ICD-10-CM | POA: Insufficient documentation

## 2019-08-10 DIAGNOSIS — R1084 Generalized abdominal pain: Secondary | ICD-10-CM | POA: Insufficient documentation

## 2019-08-10 DIAGNOSIS — F419 Anxiety disorder, unspecified: Secondary | ICD-10-CM | POA: Diagnosis not present

## 2019-08-10 DIAGNOSIS — Z87891 Personal history of nicotine dependence: Secondary | ICD-10-CM | POA: Insufficient documentation

## 2019-08-10 DIAGNOSIS — K648 Other hemorrhoids: Secondary | ICD-10-CM | POA: Insufficient documentation

## 2019-08-10 DIAGNOSIS — Z7901 Long term (current) use of anticoagulants: Secondary | ICD-10-CM | POA: Insufficient documentation

## 2019-08-10 DIAGNOSIS — R109 Unspecified abdominal pain: Secondary | ICD-10-CM | POA: Diagnosis present

## 2019-08-10 DIAGNOSIS — R197 Diarrhea, unspecified: Secondary | ICD-10-CM | POA: Insufficient documentation

## 2019-08-10 DIAGNOSIS — K219 Gastro-esophageal reflux disease without esophagitis: Secondary | ICD-10-CM | POA: Diagnosis not present

## 2019-08-10 DIAGNOSIS — I10 Essential (primary) hypertension: Secondary | ICD-10-CM | POA: Insufficient documentation

## 2019-08-10 DIAGNOSIS — D123 Benign neoplasm of transverse colon: Secondary | ICD-10-CM | POA: Diagnosis not present

## 2019-08-10 DIAGNOSIS — G8929 Other chronic pain: Secondary | ICD-10-CM | POA: Diagnosis not present

## 2019-08-10 DIAGNOSIS — K5730 Diverticulosis of large intestine without perforation or abscess without bleeding: Secondary | ICD-10-CM | POA: Insufficient documentation

## 2019-08-10 DIAGNOSIS — I2510 Atherosclerotic heart disease of native coronary artery without angina pectoris: Secondary | ICD-10-CM | POA: Diagnosis not present

## 2019-08-10 DIAGNOSIS — G4733 Obstructive sleep apnea (adult) (pediatric): Secondary | ICD-10-CM | POA: Insufficient documentation

## 2019-08-10 DIAGNOSIS — M545 Low back pain: Secondary | ICD-10-CM | POA: Insufficient documentation

## 2019-08-10 DIAGNOSIS — Z6841 Body Mass Index (BMI) 40.0 and over, adult: Secondary | ICD-10-CM | POA: Insufficient documentation

## 2019-08-10 DIAGNOSIS — E7800 Pure hypercholesterolemia, unspecified: Secondary | ICD-10-CM | POA: Insufficient documentation

## 2019-08-10 DIAGNOSIS — Z794 Long term (current) use of insulin: Secondary | ICD-10-CM | POA: Diagnosis not present

## 2019-08-10 DIAGNOSIS — D122 Benign neoplasm of ascending colon: Secondary | ICD-10-CM | POA: Insufficient documentation

## 2019-08-10 DIAGNOSIS — E669 Obesity, unspecified: Secondary | ICD-10-CM | POA: Diagnosis not present

## 2019-08-10 DIAGNOSIS — I4891 Unspecified atrial fibrillation: Secondary | ICD-10-CM | POA: Diagnosis not present

## 2019-08-10 DIAGNOSIS — Z79899 Other long term (current) drug therapy: Secondary | ICD-10-CM | POA: Diagnosis not present

## 2019-08-10 DIAGNOSIS — E119 Type 2 diabetes mellitus without complications: Secondary | ICD-10-CM | POA: Insufficient documentation

## 2019-08-10 HISTORY — PX: POLYPECTOMY: SHX5525

## 2019-08-10 HISTORY — PX: BIOPSY: SHX5522

## 2019-08-10 HISTORY — PX: COLONOSCOPY WITH PROPOFOL: SHX5780

## 2019-08-10 LAB — GLUCOSE, CAPILLARY: Glucose-Capillary: 168 mg/dL — ABNORMAL HIGH (ref 70–99)

## 2019-08-10 SURGERY — COLONOSCOPY WITH PROPOFOL
Anesthesia: Monitor Anesthesia Care

## 2019-08-10 MED ORDER — LACTATED RINGERS IV SOLN: INTRAVENOUS | Status: DC | PRN

## 2019-08-10 MED ORDER — LACTATED RINGERS IV SOLN
INTRAVENOUS | Status: DC
  Administered 2019-08-10: 10:00:00 via INTRAVENOUS

## 2019-08-10 MED ORDER — SODIUM CHLORIDE 0.9 % IV SOLN: INTRAVENOUS | Status: DC

## 2019-08-10 MED ORDER — LIDOCAINE 2% (20 MG/ML) 5 ML SYRINGE
INTRAMUSCULAR | Status: DC | PRN
  Administered 2019-08-10: 80 mg via INTRAVENOUS

## 2019-08-10 MED ORDER — PROPOFOL 10 MG/ML IV BOLUS
INTRAVENOUS | Status: DC | PRN
  Administered 2019-08-10 (×2): 20 mg via INTRAVENOUS

## 2019-08-10 MED ORDER — PROPOFOL 500 MG/50ML IV EMUL
INTRAVENOUS | Status: DC | PRN
  Administered 2019-08-10: 150 ug/kg/min via INTRAVENOUS

## 2019-08-10 MED ORDER — PROPOFOL 10 MG/ML IV BOLUS
INTRAVENOUS | Status: AC
  Filled 2019-08-10: qty 20

## 2019-08-10 MED ORDER — MIDAZOLAM HCL 2 MG/2ML IJ SOLN
INTRAMUSCULAR | Status: AC
  Filled 2019-08-10: qty 2

## 2019-08-10 MED ORDER — MIDAZOLAM HCL 5 MG/5ML IJ SOLN
INTRAMUSCULAR | Status: DC | PRN
  Administered 2019-08-10: 2 mg via INTRAVENOUS

## 2019-08-10 SURGICAL SUPPLY — 22 items

## 2019-08-10 NOTE — Anesthesia Preprocedure Evaluation (Addendum)
Anesthesia Evaluation  Patient identified by MRN, date of birth, ID band Patient awake    Reviewed: Allergy & Precautions, NPO status , Patient's Chart, lab work & pertinent test results  History of Anesthesia Complications Negative for: history of anesthetic complications  Airway Mallampati: III  TM Distance: >3 FB Neck ROM: Full    Dental  (+) Dental Advisory Given   Pulmonary sleep apnea and Continuous Positive Airway Pressure Ventilation , former smoker,    Pulmonary exam normal        Cardiovascular hypertension, Pt. on medications + CAD  Normal cardiovascular exam+ dysrhythmias (s/p ablation) Atrial Fibrillation      Neuro/Psych  Headaches, PSYCHIATRIC DISORDERS Anxiety Depression Bipolar Disorder    GI/Hepatic Neg liver ROS, GERD  Medicated and Controlled,  Endo/Other  diabetes, Poorly Controlled, Type 2, Insulin DependentMorbid obesity  Renal/GU negative Renal ROS     Musculoskeletal  (+) Arthritis ,   Abdominal (+) + obese,   Peds  Hematology negative hematology ROS (+)   Anesthesia Other Findings   Reproductive/Obstetrics                            Anesthesia Physical Anesthesia Plan  ASA: III  Anesthesia Plan: MAC   Post-op Pain Management:    Induction: Intravenous  PONV Risk Score and Plan: 1 and Propofol infusion and Treatment may vary due to age or medical condition  Airway Management Planned: Natural Airway and Simple Face Mask  Additional Equipment: None  Intra-op Plan:   Post-operative Plan:   Informed Consent: I have reviewed the patients History and Physical, chart, labs and discussed the procedure including the risks, benefits and alternatives for the proposed anesthesia with the patient or authorized representative who has indicated his/her understanding and acceptance.       Plan Discussed with: CRNA and Anesthesiologist  Anesthesia Plan Comments:         Anesthesia Quick Evaluation

## 2019-08-10 NOTE — H&P (Signed)
Gary Grimes is an 58 y.o. male.   Chief Complaint: Diarrhea and abdominal pain HPI: Pleasant, but highly anxious 58 year old gentleman has a several month history of ongoing abdominal symptoms, including both upper tract symptomatology suggestive of gastroparesis with postprandial abdominal fullness and bloating interfering with normal food consumption, and more recently, diarrhea with as many as 6 bowel movements per day.  He is also having abdominal pain which is rather diffuse, and has resulted in ER evaluation.  He has been seen in consultation at [facility] where evaluation is underway and random biopsies to check for microscopic colitis were recommended.  Past Medical History:  Diagnosis Date  . Anginal pain ([place])    ER visit 01/14/2014 visit on chart   . Anxiety    pt denies  . Bipolar disorder ([place])   . Chronic lower back pain   . Depression    did get seen in er 4/13 for evaluation-psyc situational none recent  . GERD (gastroesophageal reflux disease)   . Headache    none recent  . High cholesterol   . High triglycerides   . Hypertension   . Levator syndrome 2001   history   . OSA on CPAP   . Panic attacks   . Perirectal abscess   . S/P ablation of atrial fibrillation   . Type II diabetes mellitus ([place])     Past Surgical History:  Procedure Laterality Date  . ANAL FISSURE REPAIR  08/05/2000   proctoscopy  . APPENDECTOMY  1984  . ATRIAL FIBRILLATION ABLATION  10/28/2018  . ATRIAL FIBRILLATION ABLATION N/A 10/28/2018   Procedure: ATRIAL FIBRILLATION ABLATION;  Surgeon: Constance Haw, MD;  Location: [facility];  Service: Cardiovascular;  Laterality: N/A;  . BIOPSY  05/24/2019   Procedure: BIOPSY;  Surgeon: Ronald Lobo, MD;  Location: [facility];  Service: Endoscopy;;  . COLONOSCOPY  2011  . ESOPHAGOGASTRODUODENOSCOPY (EGD) WITH PROPOFOL N/A 05/24/2019   Procedure: ESOPHAGOGASTRODUODENOSCOPY (EGD) WITH PROPOFOL;  Surgeon: Ronald Lobo, MD;   Location: [facility];  Service: Endoscopy;  Laterality: N/A;  . HERNIA REPAIR    . INSERTION OF MESH N/A 01/29/2015   Procedure: INSERTION OF MESH;  Surgeon: Excell Seltzer, MD;  Location: [facility];  Service: General;  Laterality: N/A;  . IRRIGATION AND DEBRIDEMENT ABSCESS  02/18/2012   peri-rectal  . LEFT HEART CATH AND CORONARY ANGIOGRAPHY N/A 06/08/2018   Procedure: LEFT HEART CATH AND CORONARY ANGIOGRAPHY;  Surgeon: Leonie Man, MD;  Location: [facility];  Service: Cardiovascular;  Laterality: N/A;  . NASAL SEPTOPLASTY W/ TURBINOPLASTY  05/31/2019  . NASAL SEPTOPLASTY W/ TURBINOPLASTY Bilateral 05/31/2019   Procedure: NASAL SEPTOPLASTY WITH BILATERAL TURBINATE REDUCTION;  Surgeon: Leta Baptist, MD;  Location: [facility];  Service: ENT;  Laterality: Bilateral;  . SHOULDER ARTHROSCOPY Left ?2009   "repaired  AC joint; reattached bicept tendon"  . SHOULDER ARTHROSCOPY W/ LABRAL REPAIR Left 08/08/2007  . UMBILICAL HERNIA REPAIR  10/27/2010  . VENTRAL HERNIA REPAIR N/A 01/29/2015   Procedure: LAPAROSCOPIC VENTRAL HERNIA;  Surgeon: Excell Seltzer, MD;  Location: [facility];  Service: General;  Laterality: N/A;    Family History  Problem Relation Age of Onset  . Breast cancer Mother   . Ovarian cancer Mother    Social History:  reports that he has quit smoking. His smoking use included cigarettes. He has a 0.50 pack-year smoking history. He has never used smokeless tobacco. He reports current alcohol use. He reports that he does not use  drugs.  Allergies:  Allergies  Allergen Reactions  . Morphine Other (See Comments)    PT BECAME DELIRIOUS     Medications Prior to Admission  Medication Sig Dispense Refill  . dicyclomine (BENTYL) 20 MG tablet Take 1 tablet (20 mg total) by mouth 2 (two) times daily. 20 tablet 0  . lithium carbonate (LITHOBID) 300 MG CR tablet Take 900 mg by mouth at bedtime.   2  . losartan (COZAAR) 100 MG tablet Take 1 tablet (100 mg total) by mouth every evening.  (Patient taking differently: Take 100 mg by mouth daily. )    . omeprazole (PRILOSEC) 40 MG capsule Take 40 mg by mouth as needed (HEARTBURN).     . pioglitazone (ACTOS) 30 MG tablet Take 30 mg by mouth every morning.     . pregabalin (LYRICA) 75 MG capsule Take 1 capsule (75 mg total) by mouth 2 (two) times daily. 60 capsule 2  . testosterone cypionate (DEPOTESTOSTERONE CYPIONATE) 200 MG/ML injection Inject 1 mL into the muscle once a week.    . traZODone (DESYREL) 100 MG tablet Take 1 tablet (100 mg total) by mouth at bedtime as needed for sleep. (Patient taking differently: Take 100 mg by mouth at bedtime. ) 30 tablet 0  . apixaban (ELIQUIS) 5 MG TABS tablet Take 1 tablet (5 mg total) by mouth 2 (two) times daily. 180 tablet 1  . Ascorbic Acid (VITAMIN C) 1000 MG tablet Take 4,000 mg by mouth daily.     . Insulin NPH Isophane & Regular (HUMULIN 70/30 [place]) Inject 14 Units into the skin 2 (two) times daily.    Marland Kitchen LORazepam (ATIVAN) 1 MG tablet Take 1 tablet by mouth at bedtime.   5  . nitroGLYCERIN (NITROSTAT) 0.4 MG SL tablet Place 1 tablet (0.4 mg total) under the tongue every 5 (five) minutes as needed for up to 25 days for chest pain. 25 tablet 11  . Semaglutide (OZEMPIC, 1 MG/DOSE, [place]) Inject 0.25 mg into the skin once a week. On saturday      Results for orders placed or performed during the hospital encounter of 08/10/19 (from the past 48 hour(s))  Glucose, capillary     Status: Abnormal   Collection Time: 08/10/19  9:55 AM  Result Value Ref Range   Glucose-Capillary 168 (H) 70 - 99 mg/dL   No results found.  ROS see HPI  Blood pressure (!) 142/73, pulse 88, temperature 98.6 F (37 C), temperature source Oral, resp. rate 18, height 6' (1.829 m), weight 134.7 kg, SpO2 99 %. Physical Exam pleasant, cognitively intact Caucasian male, significantly overweight, no evident distress and in particular does not come across as significantly anxious at this time.  Anicteric, no pallor.  Chest  clear.  Heart without murmur or arrhythmia.  Abdomen adipose but without evident mass or tenderness.  No evident focal neurologic deficit.  Assessment/Plan Diffuse abdominal pain and recent diarrhea.  Proceed to colonoscopic evaluation with random mucosal biopsies.  Cleotis Nipper, MD 08/10/2019, 10:17 AM

## 2019-08-10 NOTE — Anesthesia Postprocedure Evaluation (Signed)
Anesthesia Post Note  Patient: Gary Grimes  Procedure(s) Performed: COLONOSCOPY WITH PROPOFOL (N/A ) BIOPSY POLYPECTOMY     Patient location during evaluation: PACU Anesthesia Type: MAC Level of consciousness: awake and alert Pain management: pain level controlled Vital Signs Assessment: post-procedure vital signs reviewed and stable Respiratory status: spontaneous breathing, nonlabored ventilation and respiratory function stable Cardiovascular status: stable and blood pressure returned to baseline Anesthetic complications: no    Last Vitals:  Vitals:   08/10/19 1105 08/10/19 1110  BP:  128/72  Pulse: (!) 105 82  Resp: 17 14  Temp:    SpO2: 94% 94%    Last Pain:  Vitals:   08/10/19 1110  TempSrc:   PainSc: 0-No pain                 Audry Pili

## 2019-08-10 NOTE — Transfer of Care (Signed)
Immediate Anesthesia Transfer of Care Note  Patient: Gary Grimes  Procedure(s) Performed: COLONOSCOPY WITH PROPOFOL (N/A ) BIOPSY POLYPECTOMY  Patient Location: Endoscopy Unit  Anesthesia Type:MAC  Level of Consciousness: drowsy and responds to stimulation  Airway & Oxygen Therapy: Patient Spontanous Breathing and Patient connected to face mask oxygen  Post-op Assessment: Report given to RN, Post -op Vital signs reviewed and stable and Patient moving all extremities  Post vital signs: Reviewed and stable  Last Vitals:  Vitals Value Taken Time  BP    Temp    Pulse    Resp    SpO2      Last Pain:  Vitals:   08/10/19 0937  TempSrc: Oral  PainSc: 0-No pain         Complications: No apparent anesthesia complications

## 2019-08-10 NOTE — Op Note (Signed)
[facility] Patient Name: Gary Grimes Procedure Date: 08/10/2019 MRN: [id number] Attending MD: Ronald Lobo , MD Date of Birth: [birth year] CSN: [id number] Age: 58 Admit Type: Outpatient Procedure:                Colonoscopy Indications:              Last colonoscopy: August 2010, Generalized                            abdominal pain, Clinically significant diarrhea of                            unexplained origin Providers:                Ronald Lobo, MD, Glori Bickers, RN, Ashley Jacobs, RN, Cherylynn Ridges, Technician, Caryl Pina CRNA Referring MD:              Medicines:                Monitored Anesthesia Care Complications:            No immediate complications. Estimated Blood Loss:     Estimated blood loss was minimal. Procedure:                Pre-Anesthesia Assessment:                           - Prior to the procedure, a History and Physical                            was performed, and patient medications and                            allergies were reviewed. The patient's tolerance of                            previous anesthesia was also reviewed. The risks                            and benefits of the procedure and the sedation                            options and risks were discussed with the patient.                            All questions were answered, and informed consent                            was obtained. Prior Anticoagulants: The patient has                            taken Eliquis (apixaban), last dose was 2  days                            prior to procedure. ASA Grade Assessment: III - A                            patient with severe systemic disease. After                            reviewing the risks and benefits, the patient was                            deemed in satisfactory condition to undergo the                            procedure.  After obtaining informed consent, the colonoscope                            was passed under direct vision. Throughout the                            procedure, the patient's blood pressure, pulse, and                            oxygen saturations were monitored continuously. The                            CF-HQ190L ([id number]) Olympus colonoscope was                            introduced through the anus and advanced to the the                            terminal ileum. The colonoscopy was performed                            without difficulty. The patient tolerated the                            procedure well. The quality of the bowel                            preparation was excellent. Scope In: 10:28:48 AM Scope Out: 10:53:34 AM Scope Withdrawal Time: 0 hours 21 minutes 11 seconds  Total Procedure Duration: 0 hours 24 minutes 46 seconds  Findings:      The perianal and digital rectal examinations were normal. Pertinent       negatives include normal prostate (size, shape, and consistency).      A 5 mm polyp was found in the ascending colon. The polyp was sessile.       The polyp was removed with a cold snare. Resection and retrieval were       complete. Estimated blood loss was minimal.      A 2 mm polyp was found in the hepatic flexure. The polyp was sessile.  Biopsies were taken with a cold forceps for histology. Estimated blood       loss was minimal. It appears excision was complete by this method.      Multiple medium-mouthed diverticula were found in the sigmoid colon.      No other significant abnormalities were identified in a careful       examination of the remainder of the colon. Specifically, there was no       evident inflammation to account for the patient's diarrhea      Nonetheless, biopsies for histology were taken with a cold forceps from       the entire colon for evaluation of possible microscopic colitis.      The terminal ileum appeared normal. Biopsies  were taken with a cold       forceps for histology.      The retroflexed view of the distal rectum and anal verge was normal and       showed no anal or rectal abnormalities.      Non-bleeding non-prolapsed internal hemorrhoids were found during       endoscopy. The hemorrhoids were moderate. Impression:               - No endoscopically-evident cause for patient's                            diarrhea or abdominal pain seen.                           - One 5 mm polyp in the ascending colon, removed                            with a cold snare. Resected and retrieved.                           - One 2 mm polyp at the hepatic flexure. Biopsied.                           - Diverticulosis in the sigmoid colon.                           - The examined portion of the ileum was normal.                            Biopsied.                           - The distal rectum and anal verge are normal on                            retroflexion view.                           - Non-bleeding non-prolapsed internal hemorrhoids.                           - Biopsies were taken with a cold forceps from the  entire colon for evaluation of possible microscopic                            colitis. Moderate Sedation:      This patient was sedated with monitored anesthesia care, not moderate       sedation. Recommendation:           - Await pathology results.                           - If the pathology report reveals adenomatous                            tissue, then repeat the colonoscopy for                            surveillance in 5-10 years. Procedure Code(s):        --- Professional ---                           [phone], Colonoscopy, flexible; with removal of                            tumor(s), polyp(s), or other lesion(s) by snare                            technique                           [id number], 43, Colonoscopy, flexible; with biopsy,                            single or  multiple Diagnosis Code(s):        --- Professional ---                           K63.5, Polyp of colon                           R10.84, Generalized abdominal pain                           R19.7, Diarrhea, unspecified                           K57.30, Diverticulosis of large intestine without                            perforation or abscess without bleeding CPT copyright 2019 American Medical Association. All rights reserved. The codes documented in this report are preliminary and upon coder review may  be revised to meet current compliance requirements. Ronald Lobo, MD 08/10/2019 11:03:34 AM This report has been signed electronically. Number of Addenda: 0

## 2019-08-11 ENCOUNTER — Encounter (HOSPITAL_COMMUNITY): Payer: Self-pay | Admitting: Gastroenterology

## 2019-08-21 ENCOUNTER — Ambulatory Visit (INDEPENDENT_AMBULATORY_CARE_PROVIDER_SITE_OTHER): Payer: PRIVATE HEALTH INSURANCE | Admitting: Podiatry

## 2019-08-21 ENCOUNTER — Other Ambulatory Visit: Payer: Self-pay

## 2019-08-21 DIAGNOSIS — M722 Plantar fascial fibromatosis: Secondary | ICD-10-CM | POA: Diagnosis not present

## 2019-08-21 DIAGNOSIS — E1149 Type 2 diabetes mellitus with other diabetic neurological complication: Secondary | ICD-10-CM

## 2019-08-21 NOTE — Progress Notes (Signed)
Subjective: 58 year old male presents the office today for follow-up evaluation of bilateral heel pain, plantar fasciitis as well as neuropathy. He states he is still on Lyrica 75 mg twice daily but is not been helpful he is asking about alternative treatments.  He states his feet are burning more. He is asking about alternative treatments including, PDC restored.  The injections of help previously for the plantar fasciitis requesting injections.  No recent injury.  He describes pain behind his heels.  No swelling or redness.  Denies any systemic complaints such as fevers, chills, nausea, vomiting. No acute changes since last appointment, and no other complaints at this time.   Objective: AAO x3, NAD DP/PT pulses palpable bilaterally, CRT less than 3 seconds There is tenderness palpation along the plantar medial tubercle of the calcaneus insertion plantar fascia bilaterally.  Plantar fascial peers to be intact.  No pain with lateral compression of calcaneus.  No pain on the Achilles tendon. No open lesions or pre-ulcerative lesions.  No pain with calf compression, swelling, warmth, erythema  Assessment: Bilateral heel pain, plantar fasciitis, neuropathy  Plan: -All treatment options discussed with the patient including all alternatives, risks, complications.  -Increased dose of Lyrica 50 mg twice a day.  We will look into alternative treatments such as PDC restored.  -Second steroid injections performed bilateral heels.  See procedure note below.  Continue with stretching, icing to this daily.  He added the inserts of his shoes but shoes are still very flexible.  We discussed change in shoe gear. Plantar fascial braces -Patient encouraged to call the office with any questions, concerns, change in symptoms.   Procedure: Injection Tendon/Ligament Discussed alternatives, risks, complications and verbal consent was obtained.  Location: Bilateral plantar fascia at the glabrous junction; medial  approach. Skin Prep:Alcohol  Injectate: 0.5cc 0.5% marcaine plain, 0.5 cc 2% lidocaine plain and, 1 cc kenalog 10. Disposition: Patient tolerated procedure well. Injection site dressed with a band-aid.  Post-injection care was discussed and return precautions discussed.   Return in about 4 weeks  Trula Slade DPM

## 2019-08-30 ENCOUNTER — Other Ambulatory Visit: Payer: Self-pay | Admitting: Podiatry

## 2019-08-30 ENCOUNTER — Telehealth: Payer: Self-pay | Admitting: Podiatry

## 2019-08-30 MED ORDER — PREGABALIN 75 MG PO CAPS: 75.0000 mg | ORAL_CAPSULE | Freq: Two times a day (BID) | ORAL | 2 refills | Status: DC

## 2019-08-30 NOTE — Telephone Encounter (Signed)
Left message informing pt the prescription had been called to the [facility] and reminded him of his 09/18/2019 11:15am appt.

## 2019-08-30 NOTE — Telephone Encounter (Signed)
Left message [id number] with lyrica orders.

## 2019-08-30 NOTE — Telephone Encounter (Signed)
Pt called and stated Dr. Jacqualyn Posey changed the lyrica prescription at the last visit.

## 2019-08-30 NOTE — Addendum Note (Signed)
Addended by: Harriett Sine D on: 08/30/2019 02:42 PM   Modules accepted: Orders

## 2019-08-30 NOTE — Telephone Encounter (Signed)
Left voicemail that we could reschedule his appointment to Thursday, 17 September at 2:45 pm. Told pt we would hold the slot for him until we heard back from him.

## 2019-08-30 NOTE — Telephone Encounter (Signed)
Pt states he will be out of his Lyrica tomorrow. Told pt he needed to also request a refill through his pharmacy.

## 2019-08-30 NOTE — Addendum Note (Signed)
Addended by: Harriett Sine D on: 08/30/2019 11:34 AM   Modules accepted: Orders

## 2019-08-30 NOTE — Telephone Encounter (Addendum)
I informed pt that at last appt 08/21/2019 his lyrica dose was 50mg  twice daily and what was requested as refillable was lyrica 75mg  twice daily. Pt states to be honest the lyrica is not working that great and he hates to take medicine that does not help and he would like to come in earlier than his scheduled appt to discuss the other modalities Dr. Jacqualyn Posey had mentioned.

## 2019-09-02 ENCOUNTER — Telehealth: Payer: Self-pay | Admitting: Podiatry

## 2019-09-02 NOTE — Telephone Encounter (Addendum)
This patient called to the office stating that he was having severe anxiety and he felt like the nerves in his body were all exposed.  He has been diagnosed with diabetes being bipolar and being a manic depressive.  He was seen by Dr. Jacqualyn Posey and provided injections for both heels.  He is very concerned about his severe anxiety and is having problems with his medicine.  I called and [facility].  She said she talked with him last night and he  was considering stopping the Lyrica.  She said he came to the pharmacy this morning requesting additional Lyrica.  He was requesting additional medication to help treat the neuropathy-like pain.  She was unable to give him additional medication due to the initial doctors instructions on the prescription.  Therefore I proceeded to prescribe Lyrica 100 mg 1 twice daily to be taken for 1 week.  He is to call the office and talk to Dr. Jacqualyn Posey and discuss the dosage of Lyrica to be taken in the future.    Gardiner Barefoot DPM

## 2019-09-05 ENCOUNTER — Telehealth: Payer: Self-pay | Admitting: *Deleted

## 2019-09-05 NOTE — Telephone Encounter (Signed)
Pt states he ran out of lyrica and was not aware of the side effects of being off of the medication and spoke with Dr. Prudence Davidson and he ordered 7 days of the Lyrica and directed him to contact our office today.

## 2019-09-05 NOTE — Telephone Encounter (Signed)
Pt states he was taking Lyrica 150mg  twice and really was not getting a lot of relief with it, but was not able to go without it this weekend, due to side effects of increased anxiety. Pt states he was not able to contact our office in time to get 09/14/2019 appt. I asked pt if he felt he would be able to wait until the 09/18/2019 if he took the Lyrica as he was previously taking Lyrica 150mg  twice daily. Pt stated yes.

## 2019-09-05 NOTE — Telephone Encounter (Signed)
Left message informing pt the lyrica had been sent to the pharmacy on 08/30/2019 and I reminded him that he had an appt set aside for 09/14/2019 2:45pm with Dr. Jacqualyn Posey to discuss future treatment options, and to call again if there was something I could help him with.

## 2019-09-06 ENCOUNTER — Other Ambulatory Visit: Payer: Self-pay | Admitting: Podiatry

## 2019-09-06 MED ORDER — PREGABALIN 150 MG PO CAPS: 150.0000 mg | ORAL_CAPSULE | Freq: Two times a day (BID) | ORAL | 0 refills | Status: DC

## 2019-09-06 NOTE — Telephone Encounter (Signed)
I informed pt of Dr. Leigh Aurora statement of 09/06/2019 12:29pm.

## 2019-09-06 NOTE — Telephone Encounter (Signed)
I sent the lyrica 150mg  BID to the pharmacy.  Also, please let him know that I have contacted [facility] in regards to the treatment he inquired about. I have not heard back. This is one of the alternatives he asked about.

## 2019-09-18 ENCOUNTER — Encounter: Payer: Self-pay | Admitting: Podiatry

## 2019-09-18 ENCOUNTER — Other Ambulatory Visit: Payer: Self-pay

## 2019-09-18 ENCOUNTER — Ambulatory Visit (INDEPENDENT_AMBULATORY_CARE_PROVIDER_SITE_OTHER): Payer: PRIVATE HEALTH INSURANCE | Admitting: Podiatry

## 2019-09-18 DIAGNOSIS — M722 Plantar fascial fibromatosis: Secondary | ICD-10-CM | POA: Diagnosis not present

## 2019-09-18 DIAGNOSIS — E1149 Type 2 diabetes mellitus with other diabetic neurological complication: Secondary | ICD-10-CM | POA: Diagnosis not present

## 2019-09-18 NOTE — Progress Notes (Signed)
Subjective: 58 year old male presents the office today for follow-up evaluation of bilateral heel pain, plantar fasciitis as well as neuropathy.  He has done Lyrica.  He is not sure how helpful it has been.  He states he still gets burning to both of his feet and feels his feet are on fire.  Also still gets pain on the heels but the changing shoes and inserts have been helpful as well as a steroid injection.  He is interested in surgical plan fasciitis atDenies any systemic complaints such as fevers, chills, nausea, vomiting. No acute changes since last appointment, and no other complaints at this time.   Objective: AAO x3, NAD DP/PT pulses palpable bilaterally, CRT less than 3 seconds There is continued tenderness palpation along the plantar medial tubercle of the calcaneus insertion plantar fascia bilaterally.  Plantar fascial appears to be intact.  No pain with lateral compression of calcaneus.  No pain on the Achilles tendon. No open lesions or pre-ulcerative lesions.  No pain with calf compression, swelling, warmth, erythema  Assessment: Bilateral heel pain, plantar fasciitis, neuropathy  Plan: -All treatment options discussed with the patient including all alternatives, risks, complications.  -Continue current dose of Lyrica.  We will start physical therapy to include neurogenix.  -Seroid injections performed bilateral heels.  See procedure note below.  Continue with stretching, icing to this daily.  He added the inserts of his shoes but shoes are still very flexible.  We discussed change in shoe gear. Plantar fascial braces.  Discussed surgical intervention.  Like for the neuropathy symptoms to improve before surgery if able. -Patient encouraged to call the office with any questions, concerns, change in symptoms.   Procedure: Injection Tendon/Ligament Discussed alternatives, risks, complications and verbal consent was obtained.  Location: Bilateral plantar fascia at the glabrous junction;  medial approach. Skin Prep:Alcohol  Injectate: 0.5cc 0.5% marcaine plain, 0.5 cc 2% lidocaine plain and, 1 cc kenalog 10. Disposition: Patient tolerated procedure well. Injection site dressed with a band-aid.  Post-injection care was discussed and return precautions discussed.   Return in about 4 weeks  Trula Slade DPM

## 2019-09-19 NOTE — Addendum Note (Signed)
Addended by: Harriett Sine D on: 09/19/2019 09:08 AM   Modules accepted: Orders

## 2019-10-05 ENCOUNTER — Ambulatory Visit: Payer: Self-pay

## 2019-10-05 ENCOUNTER — Ambulatory Visit (INDEPENDENT_AMBULATORY_CARE_PROVIDER_SITE_OTHER): Payer: PRIVATE HEALTH INSURANCE | Admitting: Podiatry

## 2019-10-05 ENCOUNTER — Other Ambulatory Visit: Payer: Self-pay

## 2019-10-05 DIAGNOSIS — E1149 Type 2 diabetes mellitus with other diabetic neurological complication: Secondary | ICD-10-CM

## 2019-10-05 DIAGNOSIS — M722 Plantar fascial fibromatosis: Secondary | ICD-10-CM

## 2019-10-05 DIAGNOSIS — M216X1 Other acquired deformities of right foot: Secondary | ICD-10-CM | POA: Diagnosis not present

## 2019-10-05 DIAGNOSIS — M216X2 Other acquired deformities of left foot: Secondary | ICD-10-CM

## 2019-10-05 MED ORDER — PREGABALIN 150 MG PO CAPS: 150.0000 mg | ORAL_CAPSULE | Freq: Two times a day (BID) | ORAL | 2 refills | Status: DC

## 2019-10-05 NOTE — Patient Instructions (Signed)

## 2019-10-11 ENCOUNTER — Telehealth: Payer: Self-pay | Admitting: *Deleted

## 2019-10-11 NOTE — Telephone Encounter (Signed)
"  I'm calling to cancel my surgery that is scheduled for November 11."  Would you like to reschedule it?  "No, I'm just going to hold off for right now."  I called [name] at the surgery center and canceled the surgery that was scheduled for 11/08/2019.Gary Grimes

## 2019-10-16 ENCOUNTER — Ambulatory Visit: Payer: PRIVATE HEALTH INSURANCE | Admitting: Podiatry

## 2019-10-17 ENCOUNTER — Encounter: Payer: Self-pay | Admitting: Family

## 2019-10-17 ENCOUNTER — Ambulatory Visit (INDEPENDENT_AMBULATORY_CARE_PROVIDER_SITE_OTHER): Payer: No Typology Code available for payment source | Admitting: Orthopedic Surgery

## 2019-10-17 ENCOUNTER — Ambulatory Visit: Payer: No Typology Code available for payment source | Admitting: Orthopedic Surgery

## 2019-10-17 VITALS — Ht 72.0 in | Wt 297.0 lb

## 2019-10-17 DIAGNOSIS — M6702 Short Achilles tendon (acquired), left ankle: Secondary | ICD-10-CM

## 2019-10-17 DIAGNOSIS — M722 Plantar fascial fibromatosis: Secondary | ICD-10-CM | POA: Diagnosis not present

## 2019-10-17 DIAGNOSIS — M6701 Short Achilles tendon (acquired), right ankle: Secondary | ICD-10-CM | POA: Diagnosis not present

## 2019-10-17 DIAGNOSIS — E1142 Type 2 diabetes mellitus with diabetic polyneuropathy: Secondary | ICD-10-CM

## 2019-10-18 ENCOUNTER — Other Ambulatory Visit: Payer: Self-pay | Admitting: Orthopedic Surgery

## 2019-10-18 ENCOUNTER — Telehealth: Payer: Self-pay | Admitting: Orthopedic Surgery

## 2019-10-18 ENCOUNTER — Encounter: Payer: Self-pay | Admitting: Orthopedic Surgery

## 2019-10-18 MED ORDER — HYDROCODONE-ACETAMINOPHEN 5-325 MG PO TABS: 1.0000 | ORAL_TABLET | Freq: Four times a day (QID) | ORAL | 0 refills | Status: DC | PRN

## 2019-10-18 NOTE — Telephone Encounter (Signed)
Dr Duda please advise, thank you. 

## 2019-10-18 NOTE — Progress Notes (Signed)
Office Visit Note   Patient: Gary Grimes           Date of Birth: Aug 20, 1961           MRN: [id number] Visit Date: 10/17/2019              Requested by: Shirline Frees, MD [place] [place],  [place] [address] PCP: Shirline Frees, MD  Chief Complaint  Patient presents with  . Left Foot - Pain  . Right Foot - Pain      HPI: Patient is a 58 year old gentleman with diabetic insensate neuropathy Achilles contracture chronic plantar fasciitis on the left greater than right.  Patient states he is undergone injections for the plantar fasciitis with temporary relief.  Patient states that he has tried orthotics without relief tried therapy with stretching without relief.  Patient states that the plantar fascial pain is worse with start up and worse after prolonged ambulation.  Pain primarily at the origin of the plantar fascia.  Patient states he was scheduled for surgical intervention 3 weeks ago but canceled this.  Patient does have diabetic insensate neuropathy he has sleep apnea and uses a CPAP machine.  Patient had a history of atrial fibrillation did have an ablation currently has normal sinus rhythm and states that he is still on the Eliquis but states he does not have any episodes of atrial fibrillation.  Assessment & Plan: Visit Diagnoses:  1. Diabetic polyneuropathy associated with type 2 diabetes mellitus ([place])   2. Achilles tendon contracture, right   3. Plantar fasciitis of right foot     Plan: Due to failure of conservative treatment discussed that surgical intervention would include a gastrocnemius recession and plantar fascial release on the right risk and benefits were discussed including persistent pain recurrent contracture neurovascular injury infection need for additional surgery.  Patient states he understands wished to proceed with surgery.  We will evaluate for surgery at [facility] day surgery versus [facility] main OR depending on recommendations from  anesthesia.  Patient was given instructions for Achilles stretching to be used before and after surgery.  Patient was told to get clearance to hold his Eliquis 5 days before surgery he was also told to stop the Keystone powders and he can try Voltaren gel.  Follow-Up Instructions: Return in about 1 week (around 10/24/2019).   Ortho Exam  Patient is alert, oriented, no adenopathy, well-dressed, normal affect, normal respiratory effort. Examination patient has a strong dorsalis pedis pulse bilaterally left foot has dorsiflexion 10 degrees short of neutral right foot has dorsiflexion to neutral.  Patient has good subtalar motion lateral compression of the calcaneus is nontender he has no pain to palpation over the tarsal tunnel.  He is point tender to palpation of the origin of the plantar fascia worse on the left than the right and has no plantar fibromatosis nodules.  Imaging: No results found. No images are attached to the encounter.  Labs: Lab Results  Component Value Date   HGBA1C 6.0 (H) 05/29/2019     Lab Results  Component Value Date   ALBUMIN 4.2 05/10/2019   ALBUMIN 4.0 04/05/2019   ALBUMIN 4.4 12/26/2015    No results found for: MG No results found for: VD25OH  No results found for: PREALBUMIN CBC EXTENDED Latest Ref Rng & Units 05/29/2019 05/10/2019 04/05/2019  WBC 4.0 - 10.5 K/uL 10.2 8.6 8.4  RBC 4.22 - 5.81 MIL/uL 4.50 4.61 4.33  HGB 13.0 - 17.0 g/dL 14.6 14.6  13.7  HCT 39.0 - 52.0 % 43.3 44.6 42.5  PLT 150 - 400 K/uL 251 254 288  NEUTROABS 1.7 - 7.7 K/uL - 4.3 4.5  LYMPHSABS 0.7 - 4.0 K/uL - 3.1 2.8     Body mass index is 40.28 kg/m.  Orders:  No orders of the defined types were placed in this encounter.  No orders of the defined types were placed in this encounter.    Procedures: No procedures performed  Clinical Data: No additional findings.  ROS:  All other systems negative, except as noted in the HPI. Review of Systems  Objective: Vital  Signs: Ht 6' (1.829 m)   Wt 297 lb (134.7 kg)   BMI 40.28 kg/m   Specialty Comments:  No specialty comments available.  PMFS History: Patient Active Problem List   Diagnosis Date Noted  . S/P nasal septoplasty 05/31/2019  . Preoperative cardiovascular examination 05/17/2019  . Mild CAD 05/17/2019  . Chronic pansinusitis 04/28/2019  . Plantar fasciitis 02/28/2019  . Type II diabetes mellitus with neurological manifestations ([place]) 02/28/2019  . S/P ablation of atrial fibrillation   . Paroxysmal atrial fibrillation ([place]) 06/06/2018  . Essential hypertension 06/06/2018  . Diabetes mellitus due to underlying condition with unspecified complications ([place]) [id number]  . Sleep apnea 06/06/2018  . Overweight 06/06/2018  . Bipolar disorder, manic ([place]) 05/29/2016  . MDD (major depressive disorder), recurrent severe, without psychosis ([place]) 12/27/2015  . Ventral incisional hernia 01/29/2015  . Perirectal abscess 02/18/2012  . SPONDYLOSIS 09/19/2008   Past Medical History:  Diagnosis Date  . Anginal pain ([place])    ER visit 01/14/2014 visit on chart   . Anxiety    pt denies  . Bipolar disorder ([place])   . Chronic lower back pain   . Depression    did get seen in er 4/13 for evaluation-psyc situational none recent  . GERD (gastroesophageal reflux disease)   . Headache    none recent  . High cholesterol   . High triglycerides   . Hypertension   . Levator syndrome 2001   history   . OSA on CPAP   . Panic attacks   . Perirectal abscess   . S/P ablation of atrial fibrillation   . Type II diabetes mellitus ([facility])     Family History  Problem Relation Age of Onset  . Breast cancer Mother   . Ovarian cancer Mother     Past Surgical History:  Procedure Laterality Date  . ANAL FISSURE REPAIR  08/05/2000   proctoscopy  . APPENDECTOMY  1984  . ATRIAL FIBRILLATION ABLATION  10/28/2018  . ATRIAL FIBRILLATION ABLATION N/A 10/28/2018   Procedure: ATRIAL FIBRILLATION ABLATION;  Surgeon:  Constance Haw, MD;  Location: [place] [facility];  Service: Cardiovascular;  Laterality: N/A;  . BIOPSY  05/24/2019   Procedure: BIOPSY;  Surgeon: Ronald Lobo, MD;  Location: [facility];  Service: Endoscopy;;  . BIOPSY  08/10/2019   Procedure: BIOPSY;  Surgeon: Ronald Lobo, MD;  Location: [facility];  Service: Endoscopy;;  . COLONOSCOPY  2011  . COLONOSCOPY WITH PROPOFOL N/A 08/10/2019   Procedure: COLONOSCOPY WITH PROPOFOL;  Surgeon: Ronald Lobo, MD;  Location: [facility];  Service: Endoscopy;  Laterality: N/A;  . ESOPHAGOGASTRODUODENOSCOPY (EGD) WITH PROPOFOL N/A 05/24/2019   Procedure: ESOPHAGOGASTRODUODENOSCOPY (EGD) WITH PROPOFOL;  Surgeon: Ronald Lobo, MD;  Location: [facility];  Service: Endoscopy;  Laterality: N/A;  . HERNIA REPAIR    . INSERTION OF MESH N/A 01/29/2015   Procedure: INSERTION OF MESH;  Surgeon: Excell Seltzer, MD;  Location: [facility];  Service: General;  Laterality: N/A;  . IRRIGATION AND DEBRIDEMENT ABSCESS  02/18/2012   peri-rectal  . LEFT HEART CATH AND CORONARY ANGIOGRAPHY N/A 06/08/2018   Procedure: LEFT HEART CATH AND CORONARY ANGIOGRAPHY;  Surgeon: Leonie Man, MD;  Location: [place];  Service: Cardiovascular;  Laterality: N/A;  . NASAL SEPTOPLASTY W/ TURBINOPLASTY  05/31/2019  . NASAL SEPTOPLASTY W/ TURBINOPLASTY Bilateral 05/31/2019   Procedure: NASAL SEPTOPLASTY WITH BILATERAL TURBINATE REDUCTION;  Surgeon: Leta Baptist, MD;  Location: [place];  Service: ENT;  Laterality: Bilateral;  . POLYPECTOMY  08/10/2019   Procedure: POLYPECTOMY;  Surgeon: Ronald Lobo, MD;  Location: [facility];  Service: Endoscopy;;  . SHOULDER ARTHROSCOPY Left ?2009   "repaired  AC joint; reattached bicept tendon"  . SHOULDER ARTHROSCOPY W/ LABRAL REPAIR Left 08/08/2007  . UMBILICAL HERNIA REPAIR  10/27/2010  . VENTRAL HERNIA REPAIR N/A 01/29/2015   Procedure: LAPAROSCOPIC VENTRAL HERNIA;  Surgeon: Excell Seltzer, MD;  Location: [facility];   Service: General;  Laterality: N/A;   Social History   Occupational History  . Not on file  Tobacco Use  . Smoking status: Former Smoker    Packs/day: 0.50    Years: 1.00    Pack years: 0.50    Types: Cigarettes  . Smokeless tobacco: Never Used  . Tobacco comment: quit 1983  Substance and Sexual Activity  . Alcohol use: Yes    Comment: rare wine  . Drug use: Never  . Sexual activity: Not on file

## 2019-10-18 NOTE — Telephone Encounter (Signed)
Pt called in said he is in a lot of pain and his tylenol isnt helping and is requesting something stronger for pain. [facility] [address]    [phone]

## 2019-10-18 NOTE — Telephone Encounter (Signed)
rx sent for vicodin.

## 2019-10-19 NOTE — Telephone Encounter (Signed)
Dr Sharol Given I spoke to patient and he want to know if he can get Tramadol as well so he can drive during the day and use Vicodin at night, please advise, thank you.

## 2019-10-19 NOTE — Telephone Encounter (Signed)
No, he cannot drive while taking tramedol or vicodin, so would only call in tramedol if he wanted this instead of vicodin.

## 2019-10-19 NOTE — Telephone Encounter (Signed)
Patient was called and understood, will keep the Vicodin instead.

## 2019-10-22 DIAGNOSIS — M216X1 Other acquired deformities of right foot: Secondary | ICD-10-CM | POA: Insufficient documentation

## 2019-10-22 HISTORY — DX: Other acquired deformities of right foot: M21.6X1

## 2019-10-22 NOTE — Progress Notes (Signed)
Subjective: 58-year-old male presents the office today for follow palpation of bilateral plantar fasciitis.  He states that pain is getting worse and he is having severe pain on the bottom of the heels.  This is been ongoing for numerous years has been worsening.  At this time he wants to proceed with surgery with the left side first for the plantar fasciitis.  He also states he is on the Lyrica.  He is not sure how much is helpful will.  He also wants to try to investigate other options for the neuropathy.  He is interested in [employer] (with medical modalities).  He did not do the neurogenix due to cost.  I tried to contact the company previously but never got a response. Denies any systemic complaints such as fevers, chills, nausea, vomiting. No acute changes since last appointment, and no other complaints at this time.   Objective: AAO x3, NAD DP/PT pulses palpable bilaterally, CRT less than 3 seconds There is continuation of tenderness palpation on plantar medial tubercle of the calcaneus at insertion of plantar fascia bilaterally.  Equinus is also present.  There is no pain with lateral compression of calcaneus.  There is no other areas of tenderness identified at this time. Still describing quite a bit of nerve pain to his feet as well as burning to his feet. No pain with calf compression, swelling, warmth, erythema  Assessment: 58 year old male with bilateral chronic foot pain, plantar fasciitis with neuropathy  Plan: -All treatment options discussed with the patient including all alternatives, risks, complications.  - Discussion regards to treatment options today.  He is also requesting a steroid injection today.  Steroid injection was performed bilateral heels.  Ultimately we discussed surgery at his request.  After discussion he wants to proceed with surgery.  This is been an ongoing issue for his feet for numerous years and has been worsening over last couple of years.  We discussed  endoscopic plantar fascial release as well as gastrocnemius recession the left side.  We discussed the surgical's postoperative course no guarantee that this will help with his pain.  We also need medical clearance clearance letter.  He will have to be off of Eliquis. -The incision placement as well as the postoperative course was discussed with the patient. I discussed risks of the surgery which include, but not limited to, infection, bleeding, pain, swelling, need for further surgery, delayed or nonhealing, painful or ugly scar, numbness or sensation changes, over/under correction, recurrence, transfer lesions, further deformity, hardware failure, DVT/PE, loss of toe/foot. Patient understands these risks and wishes to proceed with surgery. The surgical consent was reviewed with the patient all 3 pages were signed. No promises or guarantees were given to the outcome of the procedure. All questions were answered to the best of my ability. Before the surgery the patient was encouraged to call the office if there is any further questions. The surgery will be performed at the [facility] on an outpatient basis. -I will try to contact a representative for [employer] again.  -Patient encouraged to call the office with any questions, concerns, change in symptoms.   Trula Slade DPM

## 2019-10-28 NOTE — Addendum Note (Signed)
Addended by: [place] Mon R on: 10/28/2019 09:01 AM   Modules accepted: Orders

## 2019-10-30 ENCOUNTER — Other Ambulatory Visit: Payer: Self-pay | Admitting: Physician Assistant

## 2019-10-31 ENCOUNTER — Telehealth: Payer: Self-pay | Admitting: Radiology

## 2019-10-31 ENCOUNTER — Telehealth: Payer: Self-pay | Admitting: Orthopedic Surgery

## 2019-10-31 ENCOUNTER — Other Ambulatory Visit (HOSPITAL_COMMUNITY)
Admission: RE | Admit: 2019-10-31 | Discharge: 2019-10-31 | Disposition: A | Discharge status: Home / Self Care | Payer: No Typology Code available for payment source | Source: Ambulatory Visit | Attending: Orthopedic Surgery | Admitting: Orthopedic Surgery

## 2019-10-31 DIAGNOSIS — Z20828 Contact with and (suspected) exposure to other viral communicable diseases: Secondary | ICD-10-CM | POA: Insufficient documentation

## 2019-10-31 DIAGNOSIS — Z01812 Encounter for preprocedural laboratory examination: Secondary | ICD-10-CM | POA: Diagnosis present

## 2019-10-31 NOTE — Telephone Encounter (Signed)
Patient has called back again requesting pain medication due to severe pain, states he has question he wasn't answered before surgery on Friday.  Explained to patient that messages have been noted and sent, Dr. Sharol Given was in surgery this morning and has been seeing patients all afternoon.

## 2019-10-31 NOTE — Telephone Encounter (Signed)
Patient aware of the below message  

## 2019-10-31 NOTE — Telephone Encounter (Signed)
Pt is sch for a gastroc on Friday and is requesting pain medication today. Do you want to [facility] [address] or wait till Friday?

## 2019-10-31 NOTE — Telephone Encounter (Signed)
Pt called in requesting a refill on his pain medication doesn't remember the name of it, [facility] [address]. Pt is also requesting a call back in regards to some urgent post-op questions he has.    [phone]

## 2019-10-31 NOTE — Telephone Encounter (Signed)
Patient will need his fracture boot postoperatively and will be weightbearing as tolerated.  He will need to use crutches.  He can go up and down stairs but it may be easier to sleep downstairs postoperatively.  Will fill prescription for pain medicine after surgery.  With taking pain medicine before surgery the pain medicine will not work after surgery.

## 2019-10-31 NOTE — Telephone Encounter (Signed)
I called pt and he had questions about wtb and if he could bring his boot from home to use if needed after gastroc.  Also asked about movement in his house advised that if he is non weight bearing and will be using crutches that he will need to decide who safe it will be for him to navigate inside the house and if he has the option to sleep downstairs. Answered what questions that I could and will call with any questions.

## 2019-11-01 LAB — NOVEL CORONAVIRUS, NAA (HOSP ORDER, SEND-OUT TO REF LAB; TAT 18-24 HRS): SARS-CoV-2, NAA: NOT DETECTED

## 2019-11-02 ENCOUNTER — Encounter (HOSPITAL_COMMUNITY): Payer: Self-pay | Admitting: *Deleted

## 2019-11-02 ENCOUNTER — Other Ambulatory Visit: Payer: Self-pay

## 2019-11-02 MED ORDER — DEXTROSE 5 % IV SOLN
3.0000 g | INTRAVENOUS | Status: AC
  Administered 2019-11-03: 10:00:00 3 g via INTRAVENOUS
  Filled 2019-11-02: qty 3000
  Filled 2019-11-02: qty 3

## 2019-11-02 NOTE — Progress Notes (Addendum)
[name] denies chest pain or shortness of breath. Patient tested negative for covid and reports that he has been in quarantine since that time. [name] has type II diabetes,patient reports that CBC run 150 -200.  I instructed patient to take 70% of N 70/30 - 10 units Insulin tonight and no Insulin in am. I instructed patient to check CBG after awaking and every 2 hours until arrival  to the hospital.  I Instructed patient if CBG is less than 70 to drink  1/2 cup of a clear juice. Recheck CBG in 15 minutes then call pre- op desk at [phone] for further instructions.  [name] states he is not taking any medications in am prior to surgery.  [name] has Sleep apnea and uses a CPAP, I asked patient to bring  Mask with him, in case he is admitted; [name] prefers to not bring it in.  I instructed [name] to not eat after midnight, but drink clear liquids until 0545. I went over clear liquid options, patient said he will drink water.  "Last dose of Eliquis was Sat", per Dr Jess Barters instructions, [name] reported.

## 2019-11-03 ENCOUNTER — Encounter (HOSPITAL_COMMUNITY)
Admission: RE | Disposition: A | Discharge status: Home / Self Care | Payer: Self-pay | Source: Home / Self Care | Attending: Orthopedic Surgery

## 2019-11-03 ENCOUNTER — Ambulatory Visit (HOSPITAL_COMMUNITY)
Admission: RE | Admit: 2019-11-03 | Discharge: 2019-11-03 | Disposition: A | Discharge status: Home / Self Care | Payer: No Typology Code available for payment source | Attending: Orthopedic Surgery | Admitting: Orthopedic Surgery

## 2019-11-03 ENCOUNTER — Ambulatory Visit (HOSPITAL_COMMUNITY): Payer: No Typology Code available for payment source | Admitting: Anesthesiology

## 2019-11-03 ENCOUNTER — Encounter (HOSPITAL_COMMUNITY): Payer: Self-pay

## 2019-11-03 ENCOUNTER — Telehealth: Payer: Self-pay | Admitting: Orthopedic Surgery

## 2019-11-03 DIAGNOSIS — E781 Pure hyperglyceridemia: Secondary | ICD-10-CM | POA: Insufficient documentation

## 2019-11-03 DIAGNOSIS — I10 Essential (primary) hypertension: Secondary | ICD-10-CM | POA: Insufficient documentation

## 2019-11-03 DIAGNOSIS — M6702 Short Achilles tendon (acquired), left ankle: Secondary | ICD-10-CM | POA: Diagnosis present

## 2019-11-03 DIAGNOSIS — G8929 Other chronic pain: Secondary | ICD-10-CM | POA: Insufficient documentation

## 2019-11-03 DIAGNOSIS — M6701 Short Achilles tendon (acquired), right ankle: Secondary | ICD-10-CM | POA: Diagnosis not present

## 2019-11-03 DIAGNOSIS — Z885 Allergy status to narcotic agent status: Secondary | ICD-10-CM | POA: Insufficient documentation

## 2019-11-03 DIAGNOSIS — Z7901 Long term (current) use of anticoagulants: Secondary | ICD-10-CM | POA: Diagnosis not present

## 2019-11-03 DIAGNOSIS — E7800 Pure hypercholesterolemia, unspecified: Secondary | ICD-10-CM | POA: Insufficient documentation

## 2019-11-03 DIAGNOSIS — Z87891 Personal history of nicotine dependence: Secondary | ICD-10-CM | POA: Diagnosis not present

## 2019-11-03 DIAGNOSIS — M722 Plantar fascial fibromatosis: Secondary | ICD-10-CM | POA: Diagnosis not present

## 2019-11-03 DIAGNOSIS — F419 Anxiety disorder, unspecified: Secondary | ICD-10-CM | POA: Diagnosis not present

## 2019-11-03 DIAGNOSIS — Z79899 Other long term (current) drug therapy: Secondary | ICD-10-CM | POA: Diagnosis not present

## 2019-11-03 DIAGNOSIS — M545 Low back pain: Secondary | ICD-10-CM | POA: Insufficient documentation

## 2019-11-03 DIAGNOSIS — Z794 Long term (current) use of insulin: Secondary | ICD-10-CM | POA: Insufficient documentation

## 2019-11-03 DIAGNOSIS — Z86010 Personal history of colonic polyps: Secondary | ICD-10-CM | POA: Insufficient documentation

## 2019-11-03 DIAGNOSIS — Z8041 Family history of malignant neoplasm of ovary: Secondary | ICD-10-CM | POA: Diagnosis not present

## 2019-11-03 DIAGNOSIS — E1140 Type 2 diabetes mellitus with diabetic neuropathy, unspecified: Secondary | ICD-10-CM | POA: Insufficient documentation

## 2019-11-03 DIAGNOSIS — F319 Bipolar disorder, unspecified: Secondary | ICD-10-CM | POA: Diagnosis not present

## 2019-11-03 DIAGNOSIS — Z803 Family history of malignant neoplasm of breast: Secondary | ICD-10-CM | POA: Diagnosis not present

## 2019-11-03 DIAGNOSIS — G4733 Obstructive sleep apnea (adult) (pediatric): Secondary | ICD-10-CM | POA: Insufficient documentation

## 2019-11-03 DIAGNOSIS — K219 Gastro-esophageal reflux disease without esophagitis: Secondary | ICD-10-CM | POA: Diagnosis not present

## 2019-11-03 HISTORY — PX: GASTROCNEMIUS RECESSION: SHX863

## 2019-11-03 HISTORY — DX: Polyneuropathy, unspecified: G62.9

## 2019-11-03 HISTORY — PX: PLANTAR FASCIA RELEASE: SHX2239

## 2019-11-03 LAB — BASIC METABOLIC PANEL
Anion gap: 9 (ref 5–15)
BUN: 20 mg/dL (ref 6–20)
CO2: 21 mmol/L — ABNORMAL LOW (ref 22–32)
Calcium: 9.5 mg/dL (ref 8.9–10.3)
Chloride: 110 mmol/L (ref 98–111)
Creatinine, Ser: 1.18 mg/dL (ref 0.61–1.24)
GFR calc Af Amer: >60 mL/min (ref >60)
GFR calc non Af Amer: >60 mL/min (ref >60)
Glucose, Bld: 135 mg/dL — ABNORMAL HIGH (ref 70–99)
Potassium: 3.9 mmol/L (ref 3.5–5.1)
Sodium: 140 mmol/L (ref 135–145)

## 2019-11-03 LAB — CBC
HCT: 40.6 % (ref 39.0–52.0)
Hemoglobin: 13.5 g/dL (ref 13.0–17.0)
MCH: 32.8 pg (ref 26.0–34.0)
MCHC: 33.3 g/dL (ref 30.0–36.0)
MCV: 98.8 fL (ref 80.0–100.0)
Platelets: 245 10*3/uL (ref 150–400)
RBC: 4.11 MIL/uL — ABNORMAL LOW (ref 4.22–5.81)
RDW: 13.9 % (ref 11.5–15.5)
WBC: 6.2 10*3/uL (ref 4.0–10.5)
nRBC: 0 % (ref 0.0–0.2)

## 2019-11-03 LAB — PROTIME-INR
INR: 0.9 (ref 0.8–1.2)
Prothrombin Time: 12.5 seconds (ref 11.4–15.2)

## 2019-11-03 LAB — GLUCOSE, CAPILLARY
Glucose-Capillary: 129 mg/dL — ABNORMAL HIGH (ref 70–99)
Glucose-Capillary: 155 mg/dL — ABNORMAL HIGH (ref 70–99)

## 2019-11-03 SURGERY — RECESSION, MUSCLE, GASTROCNEMIUS
Anesthesia: General | Site: Foot | Laterality: Left

## 2019-11-03 MED ORDER — MIDAZOLAM HCL 2 MG/2ML IJ SOLN
INTRAMUSCULAR | Status: AC
  Administered 2019-11-03: 1 mg via INTRAVENOUS
  Filled 2019-11-03: qty 2

## 2019-11-03 MED ORDER — FENTANYL CITRATE (PF) 100 MCG/2ML IJ SOLN
INTRAMUSCULAR | Status: DC | PRN
  Administered 2019-11-03: 50 ug via INTRAVENOUS

## 2019-11-03 MED ORDER — ONDANSETRON HCL 4 MG/2ML IJ SOLN: 4.0000 mg | Freq: Once | INTRAMUSCULAR | Status: DC | PRN

## 2019-11-03 MED ORDER — FENTANYL CITRATE (PF) 100 MCG/2ML IJ SOLN
INTRAMUSCULAR | Status: AC
  Administered 2019-11-03: 50 ug via INTRAVENOUS
  Filled 2019-11-03: qty 2

## 2019-11-03 MED ORDER — DEXAMETHASONE SODIUM PHOSPHATE 10 MG/ML IJ SOLN
INTRAMUSCULAR | Status: DC | PRN
  Administered 2019-11-03: 4 mg via INTRAVENOUS

## 2019-11-03 MED ORDER — ONDANSETRON HCL 4 MG/2ML IJ SOLN
INTRAMUSCULAR | Status: AC
  Filled 2019-11-03: qty 2

## 2019-11-03 MED ORDER — HYDROMORPHONE HCL 1 MG/ML IJ SOLN: 0.2500 mg | INTRAMUSCULAR | Status: DC | PRN

## 2019-11-03 MED ORDER — 0.9 % SODIUM CHLORIDE (POUR BTL) OPTIME
TOPICAL | Status: DC | PRN
  Administered 2019-11-03: 1000 mL

## 2019-11-03 MED ORDER — CHLORHEXIDINE GLUCONATE 4 % EX LIQD: 60.0000 mL | Freq: Once | CUTANEOUS | Status: DC

## 2019-11-03 MED ORDER — FENTANYL CITRATE (PF) 100 MCG/2ML IJ SOLN
50.0000 ug | Freq: Once | INTRAMUSCULAR | Status: AC
  Administered 2019-11-03: 08:00:00 50 ug via INTRAVENOUS

## 2019-11-03 MED ORDER — OXYCODONE-ACETAMINOPHEN 5-325 MG PO TABS: 1.0000 | ORAL_TABLET | ORAL | 0 refills | Status: DC | PRN

## 2019-11-03 MED ORDER — MIDAZOLAM HCL 2 MG/2ML IJ SOLN
INTRAMUSCULAR | Status: AC
  Filled 2019-11-03: qty 2

## 2019-11-03 MED ORDER — MIDAZOLAM HCL 2 MG/2ML IJ SOLN
1.0000 mg | Freq: Once | INTRAMUSCULAR | Status: AC
  Administered 2019-11-03: 08:00:00 1 mg via INTRAVENOUS

## 2019-11-03 MED ORDER — POVIDONE-IODINE 10 % EX SWAB: 2.0000 "application " | Freq: Once | CUTANEOUS | Status: DC

## 2019-11-03 MED ORDER — MEPERIDINE HCL 25 MG/ML IJ SOLN: 6.2500 mg | INTRAMUSCULAR | Status: DC | PRN

## 2019-11-03 MED ORDER — FENTANYL CITRATE (PF) 250 MCG/5ML IJ SOLN
INTRAMUSCULAR | Status: AC
  Filled 2019-11-03: qty 5

## 2019-11-03 MED ORDER — LACTATED RINGERS IV SOLN
INTRAVENOUS | Status: DC
  Administered 2019-11-03 (×2): via INTRAVENOUS

## 2019-11-03 MED ORDER — LIDOCAINE 2% (20 MG/ML) 5 ML SYRINGE
INTRAMUSCULAR | Status: DC | PRN
  Administered 2019-11-03: 100 mg via INTRAVENOUS

## 2019-11-03 MED ORDER — DEXAMETHASONE SODIUM PHOSPHATE 10 MG/ML IJ SOLN
INTRAMUSCULAR | Status: AC
  Filled 2019-11-03: qty 1

## 2019-11-03 MED ORDER — PROPOFOL 10 MG/ML IV BOLUS
INTRAVENOUS | Status: AC
  Filled 2019-11-03: qty 20

## 2019-11-03 MED ORDER — BUPIVACAINE-EPINEPHRINE (PF) 0.5% -1:200000 IJ SOLN
INTRAMUSCULAR | Status: DC | PRN
  Administered 2019-11-03: 30 mL via PERINEURAL

## 2019-11-03 MED ORDER — LIDOCAINE-EPINEPHRINE (PF) 1.5 %-1:200000 IJ SOLN
INTRAMUSCULAR | Status: DC | PRN
  Administered 2019-11-03: 30 mL via PERINEURAL

## 2019-11-03 MED ORDER — ONDANSETRON HCL 4 MG/2ML IJ SOLN
INTRAMUSCULAR | Status: DC | PRN
  Administered 2019-11-03: 4 mg via INTRAVENOUS

## 2019-11-03 MED ORDER — LIDOCAINE 2% (20 MG/ML) 5 ML SYRINGE
INTRAMUSCULAR | Status: AC
  Filled 2019-11-03: qty 5

## 2019-11-03 MED ORDER — PROPOFOL 10 MG/ML IV BOLUS
INTRAVENOUS | Status: DC | PRN
  Administered 2019-11-03: 150 mg via INTRAVENOUS
  Administered 2019-11-03: 50 mg via INTRAVENOUS

## 2019-11-03 SURGICAL SUPPLY — 32 items
BLADE MINI RND TIP GREEN BEAV (BLADE) ×1 IMPLANT
BNDG COHESIVE 4X5 TAN STRL (GAUZE/BANDAGES/DRESSINGS) ×1 IMPLANT
BNDG COHESIVE 6X5 TAN STRL LF (GAUZE/BANDAGES/DRESSINGS) ×2 IMPLANT
BNDG GAUZE ELAST 4 BULKY (GAUZE/BANDAGES/DRESSINGS) ×1 IMPLANT
COVER MAYO STAND STRL (DRAPES) ×2 IMPLANT
COVER SURGICAL LIGHT HANDLE (MISCELLANEOUS) ×2 IMPLANT
COVER WAND RF STERILE (DRAPES) ×2 IMPLANT
DRAPE U-SHAPE 47X51 STRL (DRAPES) ×2 IMPLANT
DRSG EMULSION OIL 3X3 NADH (GAUZE/BANDAGES/DRESSINGS) ×2 IMPLANT
DURAPREP 26ML APPLICATOR (WOUND CARE) ×2 IMPLANT
ELECT REM PT RETURN 9FT ADLT (ELECTROSURGICAL) ×2
ELECTRODE REM PT RTRN 9FT ADLT (ELECTROSURGICAL) ×1 IMPLANT
GAUZE SPONGE 4X4 12PLY STRL (GAUZE/BANDAGES/DRESSINGS) ×2 IMPLANT
GLOVE BIOGEL PI IND STRL 9 (GLOVE) ×1 IMPLANT
GLOVE BIOGEL PI INDICATOR 9 (GLOVE) ×1
GLOVE SURG ORTHO 9.0 STRL STRW (GLOVE) ×2 IMPLANT
GOWN STRL REUS W/ TWL XL LVL3 (GOWN DISPOSABLE) ×2 IMPLANT
GOWN STRL REUS W/TWL XL LVL3 (GOWN DISPOSABLE) ×4
KIT BASIN OR (CUSTOM PROCEDURE TRAY) ×2 IMPLANT
KIT TURNOVER KIT B (KITS) ×2 IMPLANT
NDL SPNL 18GX3.5 QUINCKE PK (NEEDLE) IMPLANT
NEEDLE SPNL 18GX3.5 QUINCKE PK (NEEDLE) ×2 IMPLANT
PACK ORTHO EXTREMITY (CUSTOM PROCEDURE TRAY) ×2 IMPLANT
PAD ARMBOARD 7.5X6 YLW CONV (MISCELLANEOUS) ×4 IMPLANT
PADDING CAST ABS 4INX4YD NS (CAST SUPPLIES) ×1
PADDING CAST ABS COTTON 4X4 ST (CAST SUPPLIES) ×1 IMPLANT
SPONGE LAP 18X18 RF (DISPOSABLE) ×2 IMPLANT
SUT ETHILON 2 0 PSLX (SUTURE) ×2 IMPLANT
SUT VIC AB 2-0 CT1 27 (SUTURE) ×2
SUT VIC AB 2-0 CT1 TAPERPNT 27 (SUTURE) IMPLANT
UNDERPAD 30X30 (UNDERPADS AND DIAPERS) ×2 IMPLANT
WATER STERILE IRR 1000ML POUR (IV SOLUTION) ×2 IMPLANT

## 2019-11-03 NOTE — Anesthesia Procedure Notes (Signed)
Anesthesia Regional Block: Popliteal block   Pre-Anesthetic Checklist: ,, timeout performed, Correct Patient, Correct Site, Correct Laterality, Correct Procedure, Correct Position, site marked, Risks and benefits discussed,  Surgical consent,  Pre-op evaluation,  At surgeon's request and post-op pain management  Laterality: Left  Prep: chloraprep       Needles:  Injection technique: Single-shot  Needle Type: Echogenic Stimulator Needle     Needle Length: 10cm  Needle Gauge: 21     Additional Needles:   Procedures:, nerve stimulator,,,,,,,   Nerve Stimulator or Paresthesia:  Response: 0.4 mA,   Additional Responses:   Narrative:  Start time: 11/03/2019 8:05 AM End time: 11/03/2019 8:15 AM Injection made incrementally with aspirations every 5 mL.  Performed by: Personally  Anesthesiologist: Lillia Abed, MD  Additional Notes: Monitors applied. Patient sedated. Sterile prep and drape,hand hygiene and sterile gloves were used. Relevant anatomy identified.Needle position confirmed.Local anesthetic injected incrementally after negative aspiration. Local anesthetic spread visualized around nerve(s). Vascular puncture avoided. No complications. Image printed for medical record.The patient tolerated the procedure well.  Additional Saphenous nerve block performed. 15cc Local Anesthetic mixture placed under ultrasonic guidance along the medio-inferior border of the Sartorious muscle 6 inches above the knee.  No Problems encountered.  Lillia Abed MD

## 2019-11-03 NOTE — Transfer of Care (Signed)
Immediate Anesthesia Transfer of Care Note  Patient: Gary Grimes  Procedure(s) Performed: LEFT GASTROCNEMIUS RECESSION (Left Foot) PLANTAR FASCIA RELEASE LEFT FOOT (Left Foot)  Patient Location: PACU  Anesthesia Type:GA combined with regional for post-op pain  Level of Consciousness: awake  Airway & Oxygen Therapy: Patient Spontanous Breathing  Post-op Assessment: Report given to RN and Post -op Vital signs reviewed and stable  Post vital signs: Reviewed and stable  Last Vitals:  Vitals Value Taken Time  BP 140/77 11/03/19 1006  Temp    Pulse 80 11/03/19 1006  Resp 12 11/03/19 1006  SpO2 99 % 11/03/19 1006  Vitals shown include unvalidated device data.  Last Pain:  Vitals:   11/03/19 1005  TempSrc:   PainSc: (P) Asleep      Patients Stated Pain Goal: 3 ([id number])  Complications: No apparent anesthesia complications

## 2019-11-03 NOTE — Progress Notes (Signed)
Orthopedic Tech Progress Note Patient Details:  Gary Grimes 12/24/1961 [id number]  Ortho Devices Type of Ortho Device: Crutches Ortho Device/Splint Interventions: Application   Post Interventions Patient Tolerated: Well Instructions Provided: Care of device   [place] Pink 11/03/2019, 11:02 AM

## 2019-11-03 NOTE — Telephone Encounter (Signed)
Patient's brother [name]) called advised patient was given a yellow crate like cushion that may go under his foot. Patient want to make sure he knew what it was for. The number to contact Patient is [phone]

## 2019-11-03 NOTE — Anesthesia Preprocedure Evaluation (Signed)
Anesthesia Evaluation  Patient identified by MRN, date of birth, ID band Patient awake    Reviewed: Allergy & Precautions, NPO status , Patient's Chart, lab work & pertinent test results  Airway Mallampati: II  TM Distance: >3 FB Neck ROM: Full    Dental   Pulmonary former smoker,    Pulmonary exam normal        Cardiovascular hypertension, Normal cardiovascular exam+ dysrhythmias Atrial Fibrillation   S/P Ablation   Neuro/Psych Anxiety Depression Bipolar Disorder    GI/Hepatic GERD  Medicated and Controlled,  Endo/Other  diabetes, Type 2  Renal/GU      Musculoskeletal   Abdominal   Peds  Hematology   Anesthesia Other Findings   Reproductive/Obstetrics                             Anesthesia Physical Anesthesia Plan  ASA: III  Anesthesia Plan: General   Post-op Pain Management:  Regional for Post-op pain   Induction:   PONV Risk Score and Plan: Ondansetron and Midazolam  Airway Management Planned: Oral ETT  Additional Equipment:   Intra-op Plan:   Post-operative Plan: Extubation in OR  Informed Consent: I have reviewed the patients History and Physical, chart, labs and discussed the procedure including the risks, benefits and alternatives for the proposed anesthesia with the patient or authorized representative who has indicated his/her understanding and acceptance.       Plan Discussed with: CRNA and Surgeon  Anesthesia Plan Comments:         Anesthesia Quick Evaluation

## 2019-11-03 NOTE — H&P (Signed)
Gary Grimes is an 58 y.o. male.   Chief Complaint: Right Achilles Contracture and Plantar Fascitis HPI: Patient is a 58 year old gentleman with diabetic insensate neuropathy Achilles contracture chronic plantar fasciitis on the left greater than right.  Patient states he is undergone injections for the plantar fasciitis with temporary relief.  Patient states that he has tried orthotics without relief tried therapy with stretching without relief.  Patient states that the plantar fascial pain is worse with start up and worse after prolonged ambulation.  Pain primarily at the origin of the plantar fascia.  Patient states he was scheduled for surgical intervention 3 weeks ago but canceled this.  Patient does have diabetic insensate neuropathy he has sleep apnea and uses a CPAP machine.  Patient had a history of atrial fibrillation did have an ablation currently has normal sinus rhythm and states that he is still on the Eliquis but states he does not have any episodes of atrial fibrillation.  Past Medical History:  Diagnosis Date  . Anginal pain ([place])    ER visit 01/14/2014 visit on chart   . Anxiety    pt denies  . Bipolar disorder ([place])   . Chronic lower back pain   . Depression    did get seen in er 4/13 for evaluation-psyc situational none recent  . GERD (gastroesophageal reflux disease)   . Headache    none recent  . High cholesterol   . High triglycerides   . Hypertension   . Levator syndrome 2001   history   . Neuropathy   . OSA on CPAP   . Panic attacks   . Perirectal abscess   . S/P ablation of atrial fibrillation   . Type II diabetes mellitus ([place])     Past Surgical History:  Procedure Laterality Date  . ANAL FISSURE REPAIR  08/05/2000   proctoscopy  . APPENDECTOMY  1984  . ATRIAL FIBRILLATION ABLATION  10/28/2018  . ATRIAL FIBRILLATION ABLATION N/A 10/28/2018   Procedure: ATRIAL FIBRILLATION ABLATION;  Surgeon: Constance Haw, MD;  Location: [facility];   Service: Cardiovascular;  Laterality: N/A;  . BIOPSY  05/24/2019   Procedure: BIOPSY;  Surgeon: Ronald Lobo, MD;  [facility];  Service: Endoscopy;;  . BIOPSY  08/10/2019   Procedure: BIOPSY;  Surgeon: Ronald Lobo, MD;  [facility];  Service: Endoscopy;;  . COLONOSCOPY  2011  . COLONOSCOPY WITH PROPOFOL N/A 08/10/2019   Procedure: COLONOSCOPY WITH PROPOFOL;  Surgeon: Ronald Lobo, MD;  [facility];  Service: Endoscopy;  Laterality: N/A;  . ESOPHAGOGASTRODUODENOSCOPY (EGD) WITH PROPOFOL N/A 05/24/2019   Procedure: ESOPHAGOGASTRODUODENOSCOPY (EGD) WITH PROPOFOL;  Surgeon: Ronald Lobo, MD;  [facility];  Service: Endoscopy;  Laterality: N/A;  . HERNIA REPAIR    . INSERTION OF MESH N/A 01/29/2015   Procedure: INSERTION OF MESH;  Surgeon: Excell Seltzer, MD;  [facility];  Service: General;  Laterality: N/A;  . IRRIGATION AND DEBRIDEMENT ABSCESS  02/18/2012   peri-rectal  . LEFT HEART CATH AND CORONARY ANGIOGRAPHY N/A 06/08/2018   Procedure: LEFT HEART CATH AND CORONARY ANGIOGRAPHY;  Surgeon: Leonie Man, MD;  Location: [facility];  Service: Cardiovascular;  Laterality: N/A;  . NASAL SEPTOPLASTY W/ TURBINOPLASTY  05/31/2019  . NASAL SEPTOPLASTY W/ TURBINOPLASTY Bilateral 05/31/2019   Procedure: NASAL SEPTOPLASTY WITH BILATERAL TURBINATE REDUCTION;  Surgeon: Leta Baptist, MD;  Location: [facility];  Service: ENT;  Laterality: Bilateral;  . POLYPECTOMY  08/10/2019   Procedure: POLYPECTOMY;  Surgeon: Ronald Lobo, MD;  [facility];  Service: Endoscopy;;  . SHOULDER ARTHROSCOPY Left ?2009   "repaired  AC joint; reattached bicept tendon"  . SHOULDER ARTHROSCOPY W/ LABRAL REPAIR Left 08/08/2007  . UMBILICAL HERNIA REPAIR  10/27/2010  . VENTRAL HERNIA REPAIR N/A 01/29/2015   Procedure: LAPAROSCOPIC VENTRAL HERNIA;  Surgeon: Excell Seltzer, MD;  [facility];  Service: General;  Laterality: N/A;    Family History   Problem Relation Age of Onset  . Breast cancer Mother   . Ovarian cancer Mother    Social History:  reports that he has quit smoking. His smoking use included cigarettes. He has a 0.50 pack-year smoking history. He has never used smokeless tobacco. He reports previous alcohol use. He reports previous drug use.  Allergies:  Allergies  Allergen Reactions  . Morphine Other (See Comments)    PT BECAME DELIRIOUS     Medications Prior to Admission  Medication Sig Dispense Refill  . Ascorbic Acid (VITAMIN C) 1000 MG tablet Take 4,000 mg by mouth daily.     Marland Kitchen azelastine (ASTELIN) 0.1 % nasal spray SPRAY 1 SPRAY INTO EACH NOSTRIL TWICE A DAY    . Eszopiclone 3 MG TABS 1 TABLET IMMEDIATELY BEFORE BEDTIME ONCE A DAY FOR INSOMNIA AS NEEDED ORALLY 30 DAYS    . GAVILYTE-N WITH FLAVOR PACK 420 g solution See admin instructions.    Marland Kitchen HYDROcodone-acetaminophen (NORCO/VICODIN) 5-325 MG tablet Take 1 tablet by mouth every 6 (six) hours as needed for moderate pain. 20 tablet 0  . insulin lispro (HUMALOG) 100 UNIT/ML injection Inject into the skin.    Marland Kitchen ipratropium (ATROVENT) 0.06 % nasal spray INSTILL 2 SPRAYS IN EACH NOSTRIL TWICE DAILY    . lithium carbonate (LITHOBID) 300 MG CR tablet Take 900 mg by mouth at bedtime.   2  . losartan (COZAAR) 100 MG tablet Take 1 tablet (100 mg total) by mouth every evening. (Patient taking differently: Take 100 mg by mouth daily. )    . olmesartan (BENICAR) 40 MG tablet Take 40 mg by mouth daily.    Marland Kitchen omeprazole (PRILOSEC) 40 MG capsule Take 40 mg by mouth as needed (HEARTBURN).     . pioglitazone (ACTOS) 30 MG tablet Take 30 mg by mouth every morning.     . pregabalin (LYRICA) 150 MG capsule Take 1 capsule (150 mg total) by mouth 2 (two) times daily. 60 capsule 2  . risperiDONE (RISPERDAL) 3 MG tablet Take 6 mg by mouth at bedtime.    . risperidone (RISPERDAL) 4 MG tablet Take by mouth.    . Semaglutide (OZEMPIC, 1 MG/DOSE, [place]) Inject 0.25 mg into the skin once a  week. On saturday    . testosterone cypionate (DEPOTESTOSTERONE CYPIONATE) 200 MG/ML injection Inject 1 mL into the muscle once a week.    . traZODone (DESYREL) 100 MG tablet Take 1 tablet (100 mg total) by mouth at bedtime as needed for sleep. (Patient taking differently: Take 100 mg by mouth at bedtime. ) 30 tablet 0  . zolpidem (AMBIEN) 10 MG tablet Take by mouth.    Marland Kitchen apixaban (ELIQUIS) 5 MG TABS tablet Take 1 tablet (5 mg total) by mouth 2 (two) times daily. 180 tablet 1  . Azelastine-Fluticasone 137-50 MCG/ACT SUSP Place into the nose.    . B-D INS SYR ULTRAFINE 1CC/30G 30G X 1/2" 1 ML MISC AS DIRECTED TO USE TO DELIVER HUMALOG AS NEEDED PER SLIDING SCALE SUBCUTANOUS . 100 DAYS    . dicyclomine (  BENTYL) 20 MG tablet Take 1 tablet (20 mg total) by mouth 2 (two) times daily. 20 tablet 0  . Insulin NPH Isophane & Regular (HUMULIN 70/30 [place]) Inject 14 Units into the skin 2 (two) times daily.    Marland Kitchen LORazepam (ATIVAN) 1 MG tablet Take 1 tablet by mouth at bedtime.   5  . metoCLOPramide (REGLAN) 10 MG tablet TAKE 1 TABLET BY MOUTH FOUR TIMES A DAY FOR UP TO 14 DAYS    . mupirocin ointment (BACTROBAN) 2 % Apply a pea-sized amount in each nasal passageway BID for 10 days    . nitroGLYCERIN (NITROSTAT) 0.4 MG SL tablet Place 1 tablet (0.4 mg total) under the tongue every 5 (five) minutes as needed for up to 25 days for chest pain. 25 tablet 11  . predniSONE (STERAPRED UNI-PAK 48 TAB) 10 MG (48) TBPK tablet TAKE AS DIRECTED ON DOSEPACK      Results for orders placed or performed during the hospital encounter of 11/03/19 (from the past 48 hour(s))  Glucose, capillary     Status: Abnormal   Collection Time: 11/03/19  7:04 AM  Result Value Ref Range   Glucose-Capillary 129 (H) 70 - 99 mg/dL   No results found.  Review of Systems  All other systems reviewed and are negative.   Blood pressure 136/64, pulse 67, temperature 97.6 F (36.4 C), temperature source Oral, resp. rate 18, height 6' (1.829 m),  weight (!) 141.5 kg, SpO2 99 %. Physical Exam  Patient is alert, oriented, no adenopathy, well-dressed, normal affect, normal respiratory effort. Examination patient has a strong dorsalis pedis pulse bilaterally left foot has dorsiflexion 10 degrees short of neutral right foot has dorsiflexion to neutral.  Patient has good subtalar motion lateral compression of the calcaneus is nontender he has no pain to palpation over the tarsal tunnel.  He is point tender to palpation of the origin of the plantar fascia worse on the left than the right and has no plantar fibromatosis nodules. Assessment/Plan Plan: Due to failure of conservative treatment discussed that surgical intervention would include a gastrocnemius recession and plantar fascial release on the right risk and benefits were discussed including persistent pain recurrent contracture neurovascular injury infection need for additional surgery.  Patient states he understands wished to proceed with surgery.    Bevely Palmer Anginette Espejo, PA 11/03/2019, 7:21 AM

## 2019-11-03 NOTE — Anesthesia Postprocedure Evaluation (Signed)
Anesthesia Post Note  Patient: Gary Grimes  Procedure(s) Performed: LEFT GASTROCNEMIUS RECESSION (Left Foot) PLANTAR FASCIA RELEASE LEFT FOOT (Left Foot)     Patient location during evaluation: PACU Anesthesia Type: General Level of consciousness: awake and alert Pain management: pain level controlled Vital Signs Assessment: post-procedure vital signs reviewed and stable Respiratory status: spontaneous breathing, nonlabored ventilation, respiratory function stable and patient connected to nasal cannula oxygen Cardiovascular status: blood pressure returned to baseline and stable Postop Assessment: no apparent nausea or vomiting Anesthetic complications: no    Last Vitals:  Vitals:   11/03/19 1005 11/03/19 1020  BP: 140/77 135/77  Pulse: 80 76  Resp: 11 19  Temp: 36.6 C 36.6 C  SpO2: 100% 97%    Last Pain:  Vitals:   11/03/19 1005  TempSrc:   PainSc: Gary Grimes

## 2019-11-03 NOTE — Anesthesia Procedure Notes (Addendum)
Procedure Name: LMA Insertion Performed by: Darcella Shiffman H, CRNA Pre-anesthesia Checklist: Patient identified, Emergency Drugs available, Suction available and Patient being monitored Patient Re-evaluated:Patient Re-evaluated prior to induction Oxygen Delivery Method: Circle System Utilized Preoxygenation: Pre-oxygenation with 100% oxygen Induction Type: IV induction LMA: LMA inserted LMA Size: 5.0 Number of attempts: 1 Airway Equipment and Method: Bite block Placement Confirmation: positive ETCO2 Tube secured with: Tape Dental Injury: Teeth and Oropharynx as per pre-operative assessment        

## 2019-11-03 NOTE — Op Note (Signed)
11/03/2019  10:04 AM  PATIENT:  Gary Grimes    PRE-OPERATIVE DIAGNOSIS:  Achilles Contracture and Plantar Fascitis Left Foot  POST-OPERATIVE DIAGNOSIS:  Same  PROCEDURE:  LEFT GASTROCNEMIUS RECESSION, PLANTAR FASCIA RELEASE LEFT FOOT  SURGEON:  Newt Minion, MD  PHYSICIAN ASSISTANT:None ANESTHESIA:   General  PREOPERATIVE INDICATIONS:  [name] is a  58 y.o. male with a diagnosis of Achilles Contracture and Plantar Fascitis Left Foot who failed conservative measures and elected for surgical management.    The risks benefits and alternatives were discussed with the patient preoperatively including but not limited to the risks of infection, bleeding, nerve injury, cardiopulmonary complications, the need for revision surgery, among others, and the patient was willing to proceed.  OPERATIVE IMPLANTS: None  @ENCIMAGES @  OPERATIVE FINDINGS: Dorsiflexion 1 from 20 degrees short of neutral to 20 degrees past neutral.  C-arm fluoroscopy verified plantar fascial release.  OPERATIVE PROCEDURE: Patient was brought the operating room underwent general anesthetic.  After adequate levels anesthesia were obtained patient's left lower extremity was prepped using DuraPrep draped into a sterile field a timeout was called.  A medial longitudinal incision was made 15 cm proximal to the medial malleolus.  Blunt dissection was carried down to the gastrocnemius fascia.  With dorsiflexion pressure on the foot the gastrocnemius fascia was released under direct visualization.  This took the dorsiflexion from 20 degrees short of neutral to 20 degrees past neutral.  The wound was irrigated with normal saline subcu was closed using 2-0 Vicryl skin was closed using nylon.  Attention was then focused on the plantar fascia.  An 18-gauge needle was used to localize the origin of the plantar fascia.  A skin incision was made and a 56 [place] blade was used to release the plantar fascia under direct visualization  of the C-arm fluoroscopy.  The wound was irrigated normal saline incision closed using 2-0 nylon.  A sterile dressing was applied patient was extubated taken the PACU in stable condition.   DISCHARGE PLANNING:  Antibiotic duration: Preop antibiotics 3 g Kefzol  Weightbearing: Weightbearing as tolerated in the fracture boot  Pain medication: Prescription for Percocet  Dressing care/ Wound VAC: Follow-up in 1 week to change the dressing  Ambulatory devices: Crutches  Discharge to: Home.  Follow-up: In the office 1 week post operative.

## 2019-11-03 NOTE — Telephone Encounter (Signed)
Patient called triage today. He had surgery this morning with Dr.Duda and wanted to see if he had to wear his boot all the time. After speaking with Autumn, I told patient he could remove it at night to sleep. He needs to wear the boot at all other times.

## 2019-11-04 ENCOUNTER — Encounter (HOSPITAL_COMMUNITY): Payer: Self-pay | Admitting: Orthopedic Surgery

## 2019-11-07 ENCOUNTER — Telehealth: Payer: Self-pay | Admitting: Orthopedic Surgery

## 2019-11-07 NOTE — Telephone Encounter (Signed)
Too soon for refill, new federal guidelines,

## 2019-11-07 NOTE — Telephone Encounter (Signed)
Patient called requesting an RX refill on his pain medication.  [facility] uses [facility] [address] in [place].  CB#[phone].  Thank you.

## 2019-11-07 NOTE — Telephone Encounter (Signed)
Pt is s/p a gastroc recession and PF release on 11/03/19. He received #30 Percocet that day and is calling requesting a refill. Please advise.

## 2019-11-08 ENCOUNTER — Other Ambulatory Visit: Payer: Self-pay | Admitting: Family

## 2019-11-08 MED ORDER — OXYCODONE-ACETAMINOPHEN 5-325 MG PO TABS: 1.0000 | ORAL_TABLET | ORAL | 0 refills | Status: DC | PRN

## 2019-11-08 NOTE — Telephone Encounter (Signed)
Patient called Triage phone asking for his pain medication  States he called yesterday and noone called him back He states he is out of medication I gave him the message and states he is taking it like the bottle suggests and no more

## 2019-11-08 NOTE — Telephone Encounter (Signed)
Can you address

## 2019-11-08 NOTE — Telephone Encounter (Signed)
I called and advised pt of refill. He said that he is in so much pain that he is close to tears. I advised the pt that this should not be the case and that we could work him in to be seen today. He states that the will have to call and check with his family to see if they will be able to provide transportation but that if he is not able to come today he will call and see if we can work him in for an appt tomorrow.

## 2019-11-08 NOTE — Telephone Encounter (Signed)
Ok did refill, he has an appointment friday

## 2019-11-10 ENCOUNTER — Other Ambulatory Visit: Payer: Self-pay

## 2019-11-10 ENCOUNTER — Encounter: Payer: Self-pay | Admitting: Family

## 2019-11-10 ENCOUNTER — Ambulatory Visit (INDEPENDENT_AMBULATORY_CARE_PROVIDER_SITE_OTHER): Payer: No Typology Code available for payment source | Admitting: Family

## 2019-11-10 VITALS — Ht 72.0 in | Wt 312.0 lb

## 2019-11-10 DIAGNOSIS — E1142 Type 2 diabetes mellitus with diabetic polyneuropathy: Secondary | ICD-10-CM

## 2019-11-10 DIAGNOSIS — M722 Plantar fascial fibromatosis: Secondary | ICD-10-CM

## 2019-11-10 DIAGNOSIS — M6702 Short Achilles tendon (acquired), left ankle: Secondary | ICD-10-CM

## 2019-11-10 DIAGNOSIS — M6701 Short Achilles tendon (acquired), right ankle: Secondary | ICD-10-CM

## 2019-11-10 NOTE — Progress Notes (Signed)
Post-Op Visit Note   Patient: Gary Grimes           Date of Birth: 1961-04-05           MRN: [id number] Visit Date: 11/10/2019 PCP: Shirline Frees, MD  Chief Complaint:  Chief Complaint  Patient presents with  . Left Leg - Routine Post Op    11/03/19 gastroc recession and PF release.   . Left Foot - Routine Post Op    HPI:  HPI The patient is a 58 year old gentleman seen today status post gastroc recession and plantar fascial release on the left he is unsure how to don his cam walker we have discussed this with him at length.  He feels his boot is broken down he is unsatisfied with his current cam walker and is requesting a new one  Ortho Exam Incision and portal are well approximated sutures healing well there is some surrounding ecchymosis no erythema no warmth moderate swelling no sign of infection  Visit Diagnoses:  1. Contracture of Achilles tendon, bilateral   2. Plantar fasciitis, bilateral   3. Diabetic polyneuropathy associated with type 2 diabetes mellitus ([place])     Plan: Begin daily Dial soap cleansing.  Dry dressing changes.  He will continue his cam walker we will have provided a And a lengthy discussion of how to properly wear this he may begin weightbearing as tolerated in the cam walker he will work on passive range of motion of the foot and ankle follow-up in the office with Dr. Sharol Given  Follow-Up Instructions: Return in about 1 week (around 11/17/2019).   Imaging: No results found.  Orders:  No orders of the defined types were placed in this encounter.  No orders of the defined types were placed in this encounter.    PMFS History: Patient Active Problem List   Diagnosis Date Noted  . Achilles tendon contracture, left   . Acquired equinus deformity of both feet 10/22/2019  . S/P nasal septoplasty 05/31/2019  . Preoperative cardiovascular examination 05/17/2019  . Mild CAD 05/17/2019  . Chronic pansinusitis 04/28/2019  . Plantar fasciitis of  left foot 02/28/2019  . Type II diabetes mellitus with neurological manifestations ([place]) 02/28/2019  . S/P ablation of atrial fibrillation   . Paroxysmal atrial fibrillation ([place]) 06/06/2018  . Essential hypertension 06/06/2018  . Diabetes mellitus due to underlying condition with unspecified complications ([place]) [id number]  . Sleep apnea 06/06/2018  . Overweight 06/06/2018  . Bipolar disorder, manic ([place]) 05/29/2016  . MDD (major depressive disorder), recurrent severe, without psychosis ([place]) 12/27/2015  . Ventral incisional hernia 01/29/2015  . Perirectal abscess 02/18/2012  . SPONDYLOSIS 09/19/2008   Past Medical History:  Diagnosis Date  . Anginal pain ([place])    ER visit 01/14/2014 visit on chart   . Anxiety    pt denies  . Bipolar disorder ([place])   . Chronic lower back pain   . Depression    did get seen in er 4/13 for evaluation-psyc situational none recent  . GERD (gastroesophageal reflux disease)   . Headache    none recent  . High cholesterol   . High triglycerides   . Hypertension   . Levator syndrome 2001   history   . Neuropathy   . OSA on CPAP   . Panic attacks   . Perirectal abscess   . S/P ablation of atrial fibrillation   . Type II diabetes mellitus ([facility])     Family History  Problem Relation Age of Onset  .  Breast cancer Mother   . Ovarian cancer Mother     Past Surgical History:  Procedure Laterality Date  . ANAL FISSURE REPAIR  08/05/2000   proctoscopy  . APPENDECTOMY  1984  . ATRIAL FIBRILLATION ABLATION  10/28/2018  . ATRIAL FIBRILLATION ABLATION N/A 10/28/2018   Procedure: ATRIAL FIBRILLATION ABLATION;  Surgeon: Constance Haw, MD;  Location: [facility];  Service: Cardiovascular;  Laterality: N/A;  . BIOPSY  05/24/2019   Procedure: BIOPSY;  Surgeon: Ronald Lobo, MD;  [facility];  Service: Endoscopy;;  . BIOPSY  08/10/2019   Procedure: BIOPSY;  Surgeon: Ronald Lobo, MD;  [facility];  Service: Endoscopy;;   . COLONOSCOPY  2011  . COLONOSCOPY WITH PROPOFOL N/A 08/10/2019   Procedure: COLONOSCOPY WITH PROPOFOL;  Surgeon: Ronald Lobo, MD;  [facility];  Service: Endoscopy;  Laterality: N/A;  . ESOPHAGOGASTRODUODENOSCOPY (EGD) WITH PROPOFOL N/A 05/24/2019   Procedure: ESOPHAGOGASTRODUODENOSCOPY (EGD) WITH PROPOFOL;  Surgeon: Ronald Lobo, MD;  [facility];  Service: Endoscopy;  Laterality: N/A;  . GASTROCNEMIUS RECESSION Left 11/03/2019   Procedure: LEFT GASTROCNEMIUS RECESSION;  Surgeon: Newt Minion, MD;  Location: [facility];  Service: Orthopedics;  Laterality: Left;  . HERNIA REPAIR    . INSERTION OF MESH N/A 01/29/2015   Procedure: INSERTION OF MESH;  Surgeon: Excell Seltzer, MD;  [facility];  Service: General;  Laterality: N/A;  . IRRIGATION AND DEBRIDEMENT ABSCESS  02/18/2012   peri-rectal  . LEFT HEART CATH AND CORONARY ANGIOGRAPHY N/A 06/08/2018   Procedure: LEFT HEART CATH AND CORONARY ANGIOGRAPHY;  Surgeon: Leonie Man, MD;  Location: [facility];  Service: Cardiovascular;  Laterality: N/A;  . NASAL SEPTOPLASTY W/ TURBINOPLASTY  05/31/2019  . NASAL SEPTOPLASTY W/ TURBINOPLASTY Bilateral 05/31/2019   Procedure: NASAL SEPTOPLASTY WITH BILATERAL TURBINATE REDUCTION;  Surgeon: Leta Baptist, MD;  Location: [facility];  Service: ENT;  Laterality: Bilateral;  . PLANTAR FASCIA RELEASE Left 11/03/2019   Procedure: PLANTAR FASCIA RELEASE LEFT FOOT;  Surgeon: Newt Minion, MD;  Location: [facility];  Service: Orthopedics;  Laterality: Left;  . POLYPECTOMY  08/10/2019   Procedure: POLYPECTOMY;  Surgeon: Ronald Lobo, MD;  [facility];  Service: Endoscopy;;  . SHOULDER ARTHROSCOPY Left ?2009   "repaired  AC joint; reattached bicept tendon"  . SHOULDER ARTHROSCOPY W/ LABRAL REPAIR Left 08/08/2007  . UMBILICAL HERNIA REPAIR  10/27/2010  . VENTRAL HERNIA REPAIR N/A 01/29/2015   Procedure: LAPAROSCOPIC VENTRAL HERNIA;  Surgeon: Excell Seltzer, MD;  [facility];  Service: General;  Laterality: N/A;   Social History   Occupational History  . Not on file  Tobacco Use  . Smoking status: Former Smoker    Packs/day: 0.50    Years: 1.00    Pack years: 0.50    Types: Cigarettes  . Smokeless tobacco: Never Used  . Tobacco comment: quit 1983  Substance and Sexual Activity  . Alcohol use: Not Currently    Comment: rare wine  . Drug use: Not Currently    Comment: not since 70'S  . Sexual activity: Not on file

## 2019-11-13 ENCOUNTER — Encounter: Payer: Self-pay | Admitting: Orthopedic Surgery

## 2019-11-13 ENCOUNTER — Ambulatory Visit (INDEPENDENT_AMBULATORY_CARE_PROVIDER_SITE_OTHER): Payer: No Typology Code available for payment source | Admitting: Orthopedic Surgery

## 2019-11-13 VITALS — Ht 72.0 in | Wt 312.0 lb

## 2019-11-13 DIAGNOSIS — M6702 Short Achilles tendon (acquired), left ankle: Secondary | ICD-10-CM

## 2019-11-13 DIAGNOSIS — M722 Plantar fascial fibromatosis: Secondary | ICD-10-CM

## 2019-11-13 DIAGNOSIS — M6701 Short Achilles tendon (acquired), right ankle: Secondary | ICD-10-CM

## 2019-11-13 NOTE — Progress Notes (Signed)
Office Visit Note   Patient: Gary Grimes           Date of Birth: 04-11-1961           MRN: [id number] Visit Date: 11/13/2019              Requested by: Shirline Frees, MD [place],  [place] [address] PCP: Shirline Frees, MD  Chief Complaint  Patient presents with  . Left Foot - Routine Post Op    11/03/2019       HPI: Patient is a 58 year old gentleman who presents 10 days status post left gastrocnemius recession and plantar fascial release.  Patient is currently in a cam boot he feels like the boot is too tall he states his pain level is about a 5/10 today.  Patient feels like he is making good steady improvement.  Assessment & Plan: Visit Diagnoses:  1. Contracture of Achilles tendon, bilateral   2. Plantar fasciitis, bilateral     Plan: Follow-up in 1 week to harvest the sutures.  Patient was given instruction and demonstrated Achilles stretching we will place him in a new shorter fracture boot.  Patient request return to work in 1 week and he was given a note to return to work on the 23rd  Follow-Up Instructions: Return in about 1 week (around 11/20/2019).   Ortho Exam  Patient is alert, oriented, no adenopathy, well-dressed, normal affect, normal respiratory effort. Examination patient does have some ecchymosis and bruising from the incisions.  He has dorsiflexion about 20 degrees past neutral.  The incisions are well approximated there is no redness no cellulitis no drainage no signs of infection.  Imaging: No results found. No images are attached to the encounter.  Labs: Lab Results  Component Value Date   HGBA1C 6.0 (H) 05/29/2019     Lab Results  Component Value Date   ALBUMIN 4.2 05/10/2019   ALBUMIN 4.0 04/05/2019   ALBUMIN 4.4 12/26/2015    No results found for: MG No results found for: VD25OH  No results found for: PREALBUMIN CBC EXTENDED Latest Ref Rng & Units 11/03/2019 05/29/2019 05/10/2019  WBC 4.0 - 10.5 K/uL  6.2 10.2 8.6  RBC 4.22 - 5.81 MIL/uL 4.11(L) 4.50 4.61  HGB 13.0 - 17.0 g/dL 13.5 14.6 14.6  HCT 39.0 - 52.0 % 40.6 43.3 44.6  PLT 150 - 400 K/uL 245 251 254  NEUTROABS 1.7 - 7.7 K/uL - - 4.3  LYMPHSABS 0.7 - 4.0 K/uL - - 3.1     Body mass index is 42.31 kg/m.  Orders:  No orders of the defined types were placed in this encounter.  No orders of the defined types were placed in this encounter.    Procedures: No procedures performed  Clinical Data: No additional findings.  ROS:  All other systems negative, except as noted in the HPI. Review of Systems  Objective: Vital Signs: Ht 6' (1.829 m)   Wt (!) 312 lb (141.5 kg)   BMI 42.31 kg/m   Specialty Comments:  No specialty comments available.  PMFS History: Patient Active Problem List   Diagnosis Date Noted  . Achilles tendon contracture, left   . Acquired equinus deformity of both feet 10/22/2019  . S/P nasal septoplasty 05/31/2019  . Preoperative cardiovascular examination 05/17/2019  . Mild CAD 05/17/2019  . Chronic pansinusitis 04/28/2019  . Plantar fasciitis of left foot 02/28/2019  . Type II diabetes mellitus with neurological manifestations ([place]) 02/28/2019  . S/P  ablation of atrial fibrillation   . Paroxysmal atrial fibrillation ([place]) 06/06/2018  . Essential hypertension 06/06/2018  . Diabetes mellitus due to underlying condition with unspecified complications ([place]) XX123456  . Sleep apnea 06/06/2018  . Overweight 06/06/2018  . Bipolar disorder, manic ([place]) 05/29/2016  . MDD (major depressive disorder), recurrent severe, without psychosis ([place]) 12/27/2015  . Ventral incisional hernia 01/29/2015  . Perirectal abscess 02/18/2012  . SPONDYLOSIS 09/19/2008   Past Medical History:  Diagnosis Date  . Anginal pain ([place])    ER visit 01/14/2014 visit on chart   . Anxiety    pt denies  . Bipolar disorder ([place])   . Chronic lower back pain   . Depression    did get seen in er 4/13 for evaluation-psyc  situational none recent  . GERD (gastroesophageal reflux disease)   . Headache    none recent  . High cholesterol   . High triglycerides   . Hypertension   . Levator syndrome 2001   history   . Neuropathy   . OSA on CPAP   . Panic attacks   . Perirectal abscess   . S/P ablation of atrial fibrillation   . Type II diabetes mellitus (H[facility])     Family History  Problem Relation Age of Onset  . Breast cancer Mother   . Ovarian cancer Mother     Past Surgical History:  Procedure Laterality Date  . ANAL FISSURE REPAIR  08/05/2000   proctoscopy  . APPENDECTOMY  1984  . ATRIAL FIBRILLATION ABLATION  10/28/2018  . ATRIAL FIBRILLATION ABLATION N/A 10/28/2018   Procedure: ATRIAL FIBRILLATION ABLATION;  Surgeon: Constance Haw, MD;  Location: [facility];  Service: Cardiovascular;  Laterality: N/A;  . BIOPSY  05/24/2019   Procedure: BIOPSY;  Surgeon: Ronald Lobo, MD;  [facility];  Service: Endoscopy;;  . BIOPSY  08/10/2019   Procedure: BIOPSY;  Surgeon: Ronald Lobo, MD;  [facility];  Service: Endoscopy;;  . COLONOSCOPY  2011  . COLONOSCOPY WITH PROPOFOL N/A 08/10/2019   Procedure: COLONOSCOPY WITH PROPOFOL;  Surgeon: Ronald Lobo, MD;  [facility];  Service: Endoscopy;  Laterality: N/A;  . ESOPHAGOGASTRODUODENOSCOPY (EGD) WITH PROPOFOL N/A 05/24/2019   Procedure: ESOPHAGOGASTRODUODENOSCOPY (EGD) WITH PROPOFOL;  Surgeon: Ronald Lobo, MD;  [facility];  Service: Endoscopy;  Laterality: N/A;  . GASTROCNEMIUS RECESSION Left 11/03/2019   Procedure: LEFT GASTROCNEMIUS RECESSION;  Surgeon: Newt Minion, MD;  Location: [facility];  Service: Orthopedics;  Laterality: Left;  . HERNIA REPAIR    . INSERTION OF MESH N/A 01/29/2015   Procedure: INSERTION OF MESH;  Surgeon: Excell Seltzer, MD;  [facility];  Service: General;  Laterality: N/A;  . IRRIGATION AND DEBRIDEMENT ABSCESS  02/18/2012   peri-rectal  . LEFT HEART CATH AND  CORONARY ANGIOGRAPHY N/A 06/08/2018   Procedure: LEFT HEART CATH AND CORONARY ANGIOGRAPHY;  Surgeon: Leonie Man, MD;  Location: [facility];  Service: Cardiovascular;  Laterality: N/A;  . NASAL SEPTOPLASTY W/ TURBINOPLASTY  05/31/2019  . NASAL SEPTOPLASTY W/ TURBINOPLASTY Bilateral 05/31/2019   Procedure: NASAL SEPTOPLASTY WITH BILATERAL TURBINATE REDUCTION;  Surgeon: Leta Baptist, MD;  Location: [facility];  Service: ENT;  Laterality: Bilateral;  . PLANTAR FASCIA RELEASE Left 11/03/2019   Procedure: PLANTAR FASCIA RELEASE LEFT FOOT;  Surgeon: Newt Minion, MD;  Location: [facility];  Service: Orthopedics;  Laterality: Left;  . POLYPECTOMY  08/10/2019   Procedure: POLYPECTOMY;  Surgeon: Ronald Lobo, MD;  [facility];  Service: Endoscopy;;  . SHOULDER ARTHROSCOPY Left ?2009   "repaired  AC joint; reattached bicept tendon"  . SHOULDER ARTHROSCOPY W/ LABRAL REPAIR Left 08/08/2007  . UMBILICAL HERNIA REPAIR  10/27/2010  . VENTRAL HERNIA REPAIR N/A 01/29/2015   Procedure: LAPAROSCOPIC VENTRAL HERNIA;  Surgeon: Excell Seltzer, MD;  [facility];  Service: General;  Laterality: N/A;   Social History   Occupational History  . Not on file  Tobacco Use  . Smoking status: Former Smoker    Packs/day: 0.50    Years: 1.00    Pack years: 0.50    Types: Cigarettes  . Smokeless tobacco: Never Used  . Tobacco comment: quit 1983  Substance and Sexual Activity  . Alcohol use: Not Currently    Comment: rare wine  . Drug use: Not Currently    Comment: not since 70'S  . Sexual activity: Not on file

## 2019-11-14 ENCOUNTER — Ambulatory Visit: Payer: No Typology Code available for payment source | Admitting: Orthopedic Surgery

## 2019-11-15 ENCOUNTER — Telehealth: Payer: Self-pay | Admitting: Orthopedic Surgery

## 2019-11-15 NOTE — Telephone Encounter (Signed)
Received voicemail message from patient advised he's having trouble with numbness in his toes,extremely stiff and the pain is pretty bad. Patient said he is scheduled to return to work 11/20/2019. Patient said he has major concerns and would like a call back as soon as possible. The number to contact patient is [phone]

## 2019-11-15 NOTE — Telephone Encounter (Signed)
Patient would like to speak to you when available. Patient appt is scheduled for 11/20/2019 in our office. He was here this Monday on 11/13/2019. Thank you, please advise.

## 2019-11-15 NOTE — Telephone Encounter (Signed)
I spoke with the patient about his concerns. He still has a limp and tingling in his toes. He doesn't feel like he is progressing to the point that he will be ready to go back to work on Monday. I told him that he may just not be ready to go back to work and as he is only about 2 weeks out from surgery this is not out of the normal . I encouraged elevation to decrease the swelling. He has an appointment on Monday and was reassured

## 2019-11-20 ENCOUNTER — Ambulatory Visit (INDEPENDENT_AMBULATORY_CARE_PROVIDER_SITE_OTHER): Payer: No Typology Code available for payment source | Admitting: Orthopedic Surgery

## 2019-11-20 ENCOUNTER — Other Ambulatory Visit: Payer: Self-pay

## 2019-11-20 ENCOUNTER — Encounter: Payer: Self-pay | Admitting: Orthopedic Surgery

## 2019-11-20 VITALS — Ht 72.0 in | Wt 312.0 lb

## 2019-11-20 DIAGNOSIS — M6702 Short Achilles tendon (acquired), left ankle: Secondary | ICD-10-CM

## 2019-11-20 DIAGNOSIS — M6701 Short Achilles tendon (acquired), right ankle: Secondary | ICD-10-CM

## 2019-11-20 MED ORDER — DOXYCYCLINE HYCLATE 100 MG PO TABS: 100.0000 mg | ORAL_TABLET | Freq: Two times a day (BID) | ORAL | 0 refills | Status: DC

## 2019-11-20 NOTE — Progress Notes (Signed)
Office Visit Note   Patient: Gary Grimes           Date of Birth: 09/09/1961           MRN: [id number] Visit Date: 11/20/2019              Requested by: Shirline Frees, MD [place],  [place] [address] PCP: Shirline Frees, MD  Chief Complaint  Patient presents with  . Left Foot - Routine Post Op    11/03/2019 left foot gastro recession ;plantar fascia release      HPI: Patient is a 58 year old gentleman who presents in follow-up status post gastrocnemius recession and plantar fascial release.  Patient states he has circumferential numbness around the toes 2 through 4.  He states that they are less than they were right after surgery.  Assessment & Plan: Visit Diagnoses:  1. Contracture of Achilles tendon, bilateral     Plan: Prescription is called in for doxycycline he will start work on half days starting tomorrow.  Follow-Up Instructions: Return in about 2 weeks (around 12/04/2019).   Ortho Exam  Patient is alert, oriented, no adenopathy, well-dressed, normal affect, normal respiratory effort. Examination there is a little bit of cellulitis around the calf incision.  Sutures are harvested from both incisions that are well-healed and we will call in a prescription for doxycycline.  Patient has good sensation in the sural nerve distribution plantar sensation is intact dorsal foot sensation is intact.  Imaging: No results found. No images are attached to the encounter.  Labs: Lab Results  Component Value Date   HGBA1C 6.0 (H) 05/29/2019     Lab Results  Component Value Date   ALBUMIN 4.2 05/10/2019   ALBUMIN 4.0 04/05/2019   ALBUMIN 4.4 12/26/2015    No results found for: MG No results found for: VD25OH  No results found for: PREALBUMIN CBC EXTENDED Latest Ref Rng & Units 11/03/2019 05/29/2019 05/10/2019  WBC 4.0 - 10.5 K/uL 6.2 10.2 8.6  RBC 4.22 - 5.81 MIL/uL 4.11(L) 4.50 4.61  HGB 13.0 - 17.0 g/dL 13.5 14.6 14.6  HCT 39.0 - 52.0  % 40.6 43.3 44.6  PLT 150 - 400 K/uL 245 251 254  NEUTROABS 1.7 - 7.7 K/uL - - 4.3  LYMPHSABS 0.7 - 4.0 K/uL - - 3.1     Body mass index is 42.31 kg/m.  Orders:  No orders of the defined types were placed in this encounter.  No orders of the defined types were placed in this encounter.    Procedures: No procedures performed  Clinical Data: No additional findings.  ROS:  All other systems negative, except as noted in the HPI. Review of Systems  Objective: Vital Signs: Ht 6' (1.829 m)   Wt (!) 312 lb (141.5 kg)   BMI 42.31 kg/m   Specialty Comments:  No specialty comments available.  PMFS History: Patient Active Problem List   Diagnosis Date Noted  . Achilles tendon contracture, left   . Acquired equinus deformity of both feet 10/22/2019  . S/P nasal septoplasty 05/31/2019  . Preoperative cardiovascular examination 05/17/2019  . Mild CAD 05/17/2019  . Chronic pansinusitis 04/28/2019  . Plantar fasciitis of left foot 02/28/2019  . Type II diabetes mellitus with neurological manifestations ([place]) 02/28/2019  . S/P ablation of atrial fibrillation   . Paroxysmal atrial fibrillation ([place]) 06/06/2018  . Essential hypertension 06/06/2018  . Diabetes mellitus due to underlying condition with unspecified complications ([place]) [id number]  . Sleep  apnea 06/06/2018  . Overweight 06/06/2018  . Bipolar disorder, manic ([place]) 05/29/2016  . MDD (major depressive disorder), recurrent severe, without psychosis ([place]) 12/27/2015  . Ventral incisional hernia 01/29/2015  . Perirectal abscess 02/18/2012  . SPONDYLOSIS 09/19/2008   Past Medical History:  Diagnosis Date  . Anginal pain ([place])    ER visit 01/14/2014 visit on chart   . Anxiety    pt denies  . Bipolar disorder ([place])   . Chronic lower back pain   . Depression    did get seen in er 4/13 for evaluation-psyc situational none recent  . GERD (gastroesophageal reflux disease)   . Headache    none recent  . High  cholesterol   . High triglycerides   . Hypertension   . Levator syndrome 2001   history   . Neuropathy   . OSA on CPAP   . Panic attacks   . Perirectal abscess   . S/P ablation of atrial fibrillation   . Type II diabetes mellitus (H[facility])     Family History  Problem Relation Age of Onset  . Breast cancer Mother   . Ovarian cancer Mother     Past Surgical History:  Procedure Laterality Date  . ANAL FISSURE REPAIR  08/05/2000   proctoscopy  . APPENDECTOMY  1984  . ATRIAL FIBRILLATION ABLATION  10/28/2018  . ATRIAL FIBRILLATION ABLATION N/A 10/28/2018   Procedure: ATRIAL FIBRILLATION ABLATION;  Surgeon: Constance Haw, MD;  Location: [facility];  Service: Cardiovascular;  Laterality: N/A;  . BIOPSY  05/24/2019   Procedure: BIOPSY;  Surgeon: Ronald Lobo, MD;  [facility];  Service: Endoscopy;;  . BIOPSY  08/10/2019   Procedure: BIOPSY;  Surgeon: Ronald Lobo, MD;  [facility];  Service: Endoscopy;;  . COLONOSCOPY  2011  . COLONOSCOPY WITH PROPOFOL N/A 08/10/2019   Procedure: COLONOSCOPY WITH PROPOFOL;  Surgeon: Ronald Lobo, MD;  [facility];  Service: Endoscopy;  Laterality: N/A;  . ESOPHAGOGASTRODUODENOSCOPY (EGD) WITH PROPOFOL N/A 05/24/2019   Procedure: ESOPHAGOGASTRODUODENOSCOPY (EGD) WITH PROPOFOL;  Surgeon: Ronald Lobo, MD;  [facility];  Service: Endoscopy;  Laterality: N/A;  . GASTROCNEMIUS RECESSION Left 11/03/2019   Procedure: LEFT GASTROCNEMIUS RECESSION;  Surgeon: Newt Minion, MD;  Location: [facility];  Service: Orthopedics;  Laterality: Left;  . HERNIA REPAIR    . INSERTION OF MESH N/A 01/29/2015   Procedure: INSERTION OF MESH;  Surgeon: Excell Seltzer, MD;  [facility];  Service: General;  Laterality: N/A;  . IRRIGATION AND DEBRIDEMENT ABSCESS  02/18/2012   peri-rectal  . LEFT HEART CATH AND CORONARY ANGIOGRAPHY N/A 06/08/2018   Procedure: LEFT HEART CATH AND CORONARY ANGIOGRAPHY;  Surgeon: Leonie Man, MD;  Location: [facility];  Service: Cardiovascular;  Laterality: N/A;  . NASAL SEPTOPLASTY W/ TURBINOPLASTY  05/31/2019  . NASAL SEPTOPLASTY W/ TURBINOPLASTY Bilateral 05/31/2019   Procedure: NASAL SEPTOPLASTY WITH BILATERAL TURBINATE REDUCTION;  Surgeon: Leta Baptist, MD;  Location: [facility];  Service: ENT;  Laterality: Bilateral;  . PLANTAR FASCIA RELEASE Left 11/03/2019   Procedure: PLANTAR FASCIA RELEASE LEFT FOOT;  Surgeon: Newt Minion, MD;  Location: [facility];  Service: Orthopedics;  Laterality: Left;  . POLYPECTOMY  08/10/2019   Procedure: POLYPECTOMY;  Surgeon: Ronald Lobo, MD;  [facility];  Service: Endoscopy;;  . SHOULDER ARTHROSCOPY Left ?2009   "repaired  AC joint; reattached bicept tendon"  . SHOULDER ARTHROSCOPY W/ LABRAL REPAIR Left 08/08/2007  . UMBILICAL HERNIA REPAIR  10/27/2010  . VENTRAL HERNIA REPAIR N/A 01/29/2015   Procedure: LAPAROSCOPIC VENTRAL HERNIA;  Surgeon: Excell Seltzer, MD;  [facility];  Service: General;  Laterality: N/A;   Social History   Occupational History  . Not on file  Tobacco Use  . Smoking status: Former Smoker    Packs/day: 0.50    Years: 1.00    Pack years: 0.50    Types: Cigarettes  . Smokeless tobacco: Never Used  . Tobacco comment: quit 1983  Substance and Sexual Activity  . Alcohol use: Not Currently    Comment: rare wine  . Drug use: Not Currently    Comment: not since 70'S  . Sexual activity: Not on file

## 2019-11-21 ENCOUNTER — Ambulatory Visit: Payer: No Typology Code available for payment source | Admitting: Cardiology

## 2019-11-22 ENCOUNTER — Telehealth: Payer: No Typology Code available for payment source | Admitting: Cardiology

## 2019-11-22 ENCOUNTER — Other Ambulatory Visit: Payer: Self-pay

## 2019-11-22 ENCOUNTER — Ambulatory Visit (INDEPENDENT_AMBULATORY_CARE_PROVIDER_SITE_OTHER): Payer: No Typology Code available for payment source | Admitting: Family

## 2019-11-22 ENCOUNTER — Encounter: Payer: Self-pay | Admitting: Family

## 2019-11-22 VITALS — Ht 72.0 in | Wt 312.0 lb

## 2019-11-22 DIAGNOSIS — M6702 Short Achilles tendon (acquired), left ankle: Secondary | ICD-10-CM

## 2019-11-22 DIAGNOSIS — M6701 Short Achilles tendon (acquired), right ankle: Secondary | ICD-10-CM

## 2019-11-22 NOTE — Progress Notes (Signed)
Office Visit Note   Patient: Gary Grimes           Date of Birth: [birth year]           MRN: [id number] Visit Date: 11/22/2019              Requested by: Shirline Frees, MD [place],  [place] [address] PCP: Shirline Frees, MD  Chief Complaint  Patient presents with  . Left Leg - Routine Post Op    11/13/19 left gastroc recession and PF release   . Left Foot - Routine Post Op      HPI: This is gentleman who is approximately 18 days status post left leg gastroc recession and plantar fascial release he was seen a couple days ago in the office and was doing overall well.  He had his sutures removed is some area of redness for which he started doxycycline and this is not what he is complaining of today he states he placed weight on his foot and felt a very sharp pain in the back of his leg it caused him to have difficulty ambulating immediately afterwards .He has been on a short course of doxycycline for some reddnes around his incision and this I currently not an issue.  He has gotten slightly better today  Assessment & Plan: Visit Diagnoses: No diagnosis found.  Plan: Pain may be  some tearing of the gastroc or the achilles at the musclo tendonis junction. I would like for him to back into his boot for a week. Follow up at that time. Recommended ibuprofen 600 mg with food  Follow-Up Instructions: No follow-ups on file.   Ortho Exam  Patient is alert, oriented, no adenopathy, well-dressed, normal affect, normal respiratory effort. Focused exam healing surgical incision with very mild erythema only a spot of drainage. Achilles is palpable distally . Pain to palpation over the area of the gastroc recession. Is able to actively plantar flex. Minimal response on [place] testing.   Imaging: No results found. No images are attached to the encounter.  Labs: Lab Results  Component Value Date   HGBA1C 6.0 (H) 05/29/2019     Lab Results  Component  Value Date   ALBUMIN 4.2 05/10/2019   ALBUMIN 4.0 04/05/2019   ALBUMIN 4.4 12/26/2015    No results found for: MG No results found for: VD25OH  No results found for: PREALBUMIN CBC EXTENDED Latest Ref Rng & Units 11/03/2019 05/29/2019 05/10/2019  WBC 4.0 - 10.5 K/uL 6.2 10.2 8.6  RBC 4.22 - 5.81 MIL/uL 4.11(L) 4.50 4.61  HGB 13.0 - 17.0 g/dL 13.5 14.6 14.6  HCT 39.0 - 52.0 % 40.6 43.3 44.6  PLT 150 - 400 K/uL 245 251 254  NEUTROABS 1.7 - 7.7 K/uL - - 4.3  LYMPHSABS 0.7 - 4.0 K/uL - - 3.1     Body mass index is 42.31 kg/m.  Orders:  No orders of the defined types were placed in this encounter.  No orders of the defined types were placed in this encounter.    Procedures: No procedures performed  Clinical Data: No additional findings.  ROS:  All other systems negative, except as noted in the HPI. Review of Systems  Objective: Vital Signs: Ht 6' (1.829 m)   Wt (!) 312 lb (141.5 kg)   BMI 42.31 kg/m   Specialty Comments:  No specialty comments available.  PMFS History: Patient Active Problem List   Diagnosis Date Noted  . Achilles tendon  contracture, left   . Acquired equinus deformity of both feet 10/22/2019  . S/P nasal septoplasty 05/31/2019  . Preoperative cardiovascular examination 05/17/2019  . Mild CAD 05/17/2019  . Chronic pansinusitis 04/28/2019  . Plantar fasciitis of left foot 02/28/2019  . Type II diabetes mellitus with neurological manifestations ([place]) 02/28/2019  . S/P ablation of atrial fibrillation   . Paroxysmal atrial fibrillation ([place]) 06/06/2018  . Essential hypertension 06/06/2018  . Diabetes mellitus due to underlying condition with unspecified complications ([place]) XX123456  . Sleep apnea 06/06/2018  . Overweight 06/06/2018  . Bipolar disorder, manic ([place]) 05/29/2016  . MDD (major depressive disorder), recurrent severe, without psychosis ([place]) 12/27/2015  . Ventral incisional hernia 01/29/2015  . Perirectal abscess 02/18/2012  .  SPONDYLOSIS 09/19/2008   Past Medical History:  Diagnosis Date  . Anginal pain ([place])    ER visit 01/14/2014 visit on chart   . Anxiety    pt denies  . Bipolar disorder ([place])   . Chronic lower back pain   . Depression    did get seen in er 4/13 for evaluation-psyc situational none recent  . GERD (gastroesophageal reflux disease)   . Headache    none recent  . High cholesterol   . High triglycerides   . Hypertension   . Levator syndrome 2001   history   . Neuropathy   . OSA on CPAP   . Panic attacks   . Perirectal abscess   . S/P ablation of atrial fibrillation   . Type II diabetes mellitus ([facility])     Family History  Problem Relation Age of Onset  . Breast cancer Mother   . Ovarian cancer Mother     Past Surgical History:  Procedure Laterality Date  . ANAL FISSURE REPAIR  08/05/2000   proctoscopy  . APPENDECTOMY  1984  . ATRIAL FIBRILLATION ABLATION  10/28/2018  . ATRIAL FIBRILLATION ABLATION N/A 10/28/2018   Procedure: ATRIAL FIBRILLATION ABLATION;  Surgeon: Constance Haw, MD;  Location: [facility];  Service: Cardiovascular;  Laterality: N/A;  . BIOPSY  05/24/2019   Procedure: BIOPSY;  Surgeon: Ronald Lobo, MD;  [facility];  Service: Endoscopy;;  . BIOPSY  08/10/2019   Procedure: BIOPSY;  Surgeon: Ronald Lobo, MD;  [facility];  Service: Endoscopy;;  . COLONOSCOPY  2011  . COLONOSCOPY WITH PROPOFOL N/A 08/10/2019   Procedure: COLONOSCOPY WITH PROPOFOL;  Surgeon: Ronald Lobo, MD;  [facility];  Service: Endoscopy;  Laterality: N/A;  . ESOPHAGOGASTRODUODENOSCOPY (EGD) WITH PROPOFOL N/A 05/24/2019   Procedure: ESOPHAGOGASTRODUODENOSCOPY (EGD) WITH PROPOFOL;  Surgeon: Ronald Lobo, MD;  [facility];  Service: Endoscopy;  Laterality: N/A;  . GASTROCNEMIUS RECESSION Left 11/03/2019   Procedure: LEFT GASTROCNEMIUS RECESSION;  Surgeon: Newt Minion, MD;  Location: [facility];  Service: Orthopedics;  Laterality:  Left;  . HERNIA REPAIR    . INSERTION OF MESH N/A 01/29/2015   Procedure: INSERTION OF MESH;  Surgeon: Excell Seltzer, MD;  [facility];  Service: General;  Laterality: N/A;  . IRRIGATION AND DEBRIDEMENT ABSCESS  02/18/2012   peri-rectal  . LEFT HEART CATH AND CORONARY ANGIOGRAPHY N/A 06/08/2018   Procedure: LEFT HEART CATH AND CORONARY ANGIOGRAPHY;  Surgeon: Leonie Man, MD;  Location: [facility];  Service: Cardiovascular;  Laterality: N/A;  . NASAL SEPTOPLASTY W/ TURBINOPLASTY  05/31/2019  . NASAL SEPTOPLASTY W/ TURBINOPLASTY Bilateral 05/31/2019   Procedure: NASAL SEPTOPLASTY WITH BILATERAL TURBINATE REDUCTION;  Surgeon: Leta Baptist, MD;  Location: [facility];  Service: ENT;  Laterality: Bilateral;  . PLANTAR FASCIA RELEASE Left 11/03/2019   Procedure: PLANTAR FASCIA RELEASE LEFT FOOT;  Surgeon: Newt Minion, MD;  Location: [facility];  Service: Orthopedics;  Laterality: Left;  . POLYPECTOMY  08/10/2019   Procedure: POLYPECTOMY;  Surgeon: Ronald Lobo, MD;  [facility];  Service: Endoscopy;;  . SHOULDER ARTHROSCOPY Left ?2009   "repaired  AC joint; reattached bicept tendon"  . SHOULDER ARTHROSCOPY W/ LABRAL REPAIR Left 08/08/2007  . UMBILICAL HERNIA REPAIR  10/27/2010  . VENTRAL HERNIA REPAIR N/A 01/29/2015   Procedure: LAPAROSCOPIC VENTRAL HERNIA;  Surgeon: Excell Seltzer, MD;  [facility];  Service: General;  Laterality: N/A;   Social History   Occupational History  . Not on file  Tobacco Use  . Smoking status: Former Smoker    Packs/day: 0.50    Years: 1.00    Pack years: 0.50    Types: Cigarettes  . Smokeless tobacco: Never Used  . Tobacco comment: quit 1983  Substance and Sexual Activity  . Alcohol use: Not Currently    Comment: rare wine  . Drug use: Not Currently    Comment: not since 70'S  . Sexual activity: Not on file

## 2019-11-29 ENCOUNTER — Encounter: Payer: Self-pay | Admitting: Cardiology

## 2019-11-29 ENCOUNTER — Other Ambulatory Visit: Payer: Self-pay

## 2019-11-29 ENCOUNTER — Ambulatory Visit (HOSPITAL_BASED_OUTPATIENT_CLINIC_OR_DEPARTMENT_OTHER)
Admission: RE | Admit: 2019-11-29 | Discharge: 2019-11-29 | Disposition: A | Discharge status: Home / Self Care | Payer: No Typology Code available for payment source | Source: Ambulatory Visit | Attending: Cardiology | Admitting: Cardiology

## 2019-11-29 ENCOUNTER — Telehealth: Payer: Self-pay

## 2019-11-29 ENCOUNTER — Ambulatory Visit (INDEPENDENT_AMBULATORY_CARE_PROVIDER_SITE_OTHER): Payer: No Typology Code available for payment source | Admitting: Cardiology

## 2019-11-29 VITALS — BP 136/58 | HR 76 | Wt 313.0 lb

## 2019-11-29 DIAGNOSIS — R0600 Dyspnea, unspecified: Secondary | ICD-10-CM | POA: Insufficient documentation

## 2019-11-29 DIAGNOSIS — R0602 Shortness of breath: Secondary | ICD-10-CM | POA: Insufficient documentation

## 2019-11-29 DIAGNOSIS — R7989 Other specified abnormal findings of blood chemistry: Secondary | ICD-10-CM

## 2019-11-29 DIAGNOSIS — Z9889 Other specified postprocedural states: Secondary | ICD-10-CM | POA: Diagnosis not present

## 2019-11-29 DIAGNOSIS — I480 Paroxysmal atrial fibrillation: Secondary | ICD-10-CM

## 2019-11-29 DIAGNOSIS — F332 Major depressive disorder, recurrent severe without psychotic features: Secondary | ICD-10-CM

## 2019-11-29 DIAGNOSIS — E088 Diabetes mellitus due to underlying condition with unspecified complications: Secondary | ICD-10-CM

## 2019-11-29 DIAGNOSIS — Z8679 Personal history of other diseases of the circulatory system: Secondary | ICD-10-CM

## 2019-11-29 DIAGNOSIS — I10 Essential (primary) hypertension: Secondary | ICD-10-CM

## 2019-11-29 DIAGNOSIS — R0609 Other forms of dyspnea: Secondary | ICD-10-CM

## 2019-11-29 DIAGNOSIS — I2510 Atherosclerotic heart disease of native coronary artery without angina pectoris: Secondary | ICD-10-CM

## 2019-11-29 HISTORY — DX: Shortness of breath: R06.02

## 2019-11-29 HISTORY — DX: Dyspnea, unspecified: R06.00

## 2019-11-29 LAB — D-DIMER, QUANTITATIVE: D-DIMER: 1.98 mg/L FEU — ABNORMAL HIGH (ref 0.00–0.49)

## 2019-11-29 MED ORDER — FUROSEMIDE 20 MG PO TABS: 20.0000 mg | ORAL_TABLET | Freq: Every day | ORAL | 3 refills | Status: DC

## 2019-11-29 MED ORDER — LOSARTAN POTASSIUM 100 MG PO TABS: 50.0000 mg | ORAL_TABLET | Freq: Every evening | ORAL | Status: DC

## 2019-11-29 MED ORDER — IOHEXOL 350 MG/ML SOLN
100.0000 mL | Freq: Once | INTRAVENOUS | Status: AC | PRN
  Administered 2019-11-29: 100 mL via INTRAVENOUS

## 2019-11-29 NOTE — Patient Instructions (Signed)
Medication Instructions:  Your physician has recommended you make the following change in your medication:   START taking furosemide 20 mg(1 tablet) once daily  DECREASE losartan to 50 mg(0.5 mg) once daily  *If you need a refill on your cardiac medications before your next appointment, please call your pharmacy*  Lab Work: Your physician recommends that you have a BMP and D-Dimer drawn today  You will need to return in 2 weeks for repeat BMP.  If you have labs (blood work) drawn today and your tests are completely normal, you will receive your results only by: . MyChart Message (if you have MyChart) OR . A paper copy in the mail If you have any lab test that is abnormal or we need to change your treatment, we will call you to review the results.  Testing/Procedures: NONE  Follow-Up: At [facility], you and your health needs are our priority.  As part of our continuing mission to provide you with exceptional heart care, we have created designated Provider Care Teams.  These Care Teams include your primary Cardiologist (physician) and Advanced Practice Providers (APPs -  Physician Assistants and Nurse Practitioners) who all work together to provide you with the care you need, when you need it.  Your next appointment:   6 month(s)  The format for your next appointment:   In Person  Provider:   [name], MD  Other Instructions Furosemide tablets What is this medicine? FUROSEMIDE (fyoor [place] se mide) is a diuretic. It helps you make more urine and to lose salt and excess water from your body. This medicine is used to treat high blood pressure, and edema or swelling from heart, kidney, or liver disease. This medicine may be used for other purposes; ask your health care provider or pharmacist if you have questions. COMMON BRAND NAME(S): Active-Medicated Specimen Kit, Delone, Diuscreen, Lasix, RX Specimen Collection Kit, Specimen Collection Kit, URINX Medicated Specimen  Collection What should I tell my health care provider before I take this medicine? They need to know if you have any of these conditions:  abnormal blood electrolytes  diarrhea or vomiting  gout  heart disease  kidney disease, small amounts of urine, or difficulty passing urine  liver disease  thyroid disease  an unusual or allergic reaction to furosemide, sulfa drugs, other medicines, foods, dyes, or preservatives  pregnant or trying to get pregnant  breast-feeding How should I use this medicine? Take this medicine by mouth with a glass of water. Follow the directions on the prescription label. You may take this medicine with or without food. If it upsets your stomach, take it with food or milk. Do not take your medicine more often than directed. Remember that you will need to pass more urine after taking this medicine. Do not take your medicine at a time of day that will cause you problems. Do not take at bedtime. Talk to your pediatrician regarding the use of this medicine in children. While this drug may be prescribed for selected conditions, precautions do apply. Overdosage: If you think you have taken too much of this medicine contact a poison control center or emergency room at once. NOTE: This medicine is only for you. Do not share this medicine with others. What if I miss a dose? If you miss a dose, take it as soon as you can. If it is almost time for your next dose, take only that dose. Do not take double or extra doses. What may interact with this medicine?    aspirin and aspirin-like medicines  certain antibiotics  chloral hydrate  cisplatin  cyclosporine  digoxin  diuretics  laxatives  lithium  medicines for blood pressure  medicines that relax muscles for surgery  methotrexate  NSAIDs, medicines for pain and inflammation like ibuprofen, naproxen, or indomethacin  phenytoin  steroid medicines like prednisone or cortisone  sucralfate  thyroid  hormones This list may not describe all possible interactions. Give your health care provider a list of all the medicines, herbs, non-prescription drugs, or dietary supplements you use. Also tell them if you smoke, drink alcohol, or use illegal drugs. Some items may interact with your medicine. What should I watch for while using this medicine? Visit your doctor or health care provider for regular checks on your progress. Check your blood pressure regularly. Ask your doctor or health care provider what your blood pressure should be, and when you should contact him or her. If you are a diabetic, check your blood sugar as directed. This medicine may cause serious skin reactions. They can happen weeks to months after starting the medicine. Contact your health care provider right away if you notice fevers or flu-like symptoms with a rash. The rash may be red or purple and then turn into blisters or peeling of the skin. Or, you might notice a red rash with swelling of the face, lips or lymph nodes in your neck or under your arms. You may need to be on a special diet while taking this medicine. Check with your doctor. Also, ask how many glasses of fluid you need to drink a day. You must not get dehydrated. You may get drowsy or dizzy. Do not drive, use machinery, or do anything that needs mental alertness until you know how this drug affects you. Do not stand or sit up quickly, especially if you are an older patient. This reduces the risk of dizzy or fainting spells. Alcohol can make you more drowsy and dizzy. Avoid alcoholic drinks. This medicine can make you more sensitive to the sun. Keep out of the sun. If you cannot avoid being in the sun, wear protective clothing and use sunscreen. Do not use sun lamps or tanning beds/booths. What side effects may I notice from receiving this medicine? Side effects that you should report to your doctor or health care professional as soon as possible:  blood in urine or  stools  dry mouth  fever or chills  hearing loss or ringing in the ears  irregular heartbeat  muscle pain or weakness, cramps  rash, fever, and swollen lymph nodes  redness, blistering, peeling or loosening of the skin, including inside the mouth  skin rash  stomach upset, pain, or nausea  tingling or numbness in the hands or feet  unusually weak or tired  vomiting or diarrhea  yellowing of the eyes or skin Side effects that usually do not require medical attention (report to your doctor or health care professional if they continue or are bothersome):  headache  loss of appetite  unusual bleeding or bruising This list may not describe all possible side effects. Call your doctor for medical advice about side effects. You may report side effects to FDA at 1-800-FDA-1088. Where should I keep my medicine? Keep out of the reach of children. Store at room temperature between 15 and 30 degrees C (59 and 86 degrees F). Protect from light. Throw away any unused medicine after the expiration date. NOTE: This sheet is a summary. It may not cover all possible information.  If you have questions about this medicine, talk to your doctor, pharmacist, or health care provider.  2020 Elsevier/Gold Standard (2019-03-17 14:04:13)

## 2019-11-29 NOTE — Progress Notes (Signed)
Cardiology Office Note:    Date:  11/29/2019   ID:  Gary Grimes, DOB Jan 16, 1961, MRN [id number]  PCP:  Shirline Frees, MD  Cardiologist:  Jenean Lindau, MD   Referring MD: Shirline Frees, MD    ASSESSMENT:    1. DOE (dyspnea on exertion)   2. Paroxysmal atrial fibrillation (HCC)   3. S/P ablation of atrial fibrillation   4. MDD (major depressive disorder), recurrent severe, without psychosis ([place])   5. Diabetes mellitus due to underlying condition with unspecified complications ([place])   6. Mild CAD   7. Essential hypertension    PLAN:    In order of problems listed above:  1. Dyspnea on exertion and shortness of breath: In view of the above findings I have reduced his ACE inhibitor dose.  I have cut it into half dose.  I will start him on furosemide 20 mg daily.  He will have a Chem-7 today and in a week.  I will also get a D-dimer to make sure that there is no issues such as pulmonary thromboembolism that are causing shortness of breath.  He is on anticoagulation but I am not extremely sure about his regularity of taking medications and compliance in view of his issues with depression.  This has been going on for the past 1 month.  I discussed with him and he had multiple questions which were answered to his satisfaction. 2. Essential hypertension: Blood pressure stable 3. Diabetes mellitus and mixed dyslipidemia: Diet was discussed and weight reduction was stressed extensively.  He promises to do better. 4. Patient will be seen in follow-up appointment in 3 months or earlier if the patient has any concerns    Medication Adjustments/Labs and Tests Ordered: Current medicines are reviewed at length with the patient today.  Concerns regarding medicines are outlined above.  Orders Placed This Encounter  Procedures  . Basic Metabolic Panel (BMET)  . D-Dimer, Quantitative  . Basic Metabolic Panel (BMET)   Meds ordered this encounter  Medications  . losartan (COZAAR) 100  MG tablet    Sig: Take 0.5 tablets (50 mg total) by mouth every evening.  . furosemide (LASIX) 20 MG tablet    Sig: Take 1 tablet (20 mg total) by mouth daily.    Dispense:  90 tablet    Refill:  3     No chief complaint on file.    History of Present Illness:    Gary Grimes is a 58 y.o. male.  Patient has past medical history of essential hypertension, dyslipidemia, diabetes mellitus and sleep apnea.  He denies any problems at this time except shortness of breath especially when he lies down.  No orthopnea or PND.  He leads a sedentary lifestyle.  He is morbidly obese.  He is very noncompliant with dietary issues and he tells me that this is because of his depression and he is working with his mental health provider for this.  At the time of my evaluation, the patient is alert awake oriented and in no distress.  Past Medical History:  Diagnosis Date  . Anginal pain ([place])    ER visit 01/14/2014 visit on chart   . Anxiety    pt denies  . Bipolar disorder ([place])   . Chronic lower back pain   . Depression    did get seen in er 4/13 for evaluation-psyc situational none recent  . GERD (gastroesophageal reflux disease)   . Headache    none recent  .  High cholesterol   . High triglycerides   . Hypertension   . Levator syndrome 2001   history   . Neuropathy   . OSA on CPAP   . Panic attacks   . Perirectal abscess   . S/P ablation of atrial fibrillation   . Type II diabetes mellitus ([place])     Past Surgical History:  Procedure Laterality Date  . ANAL FISSURE REPAIR  08/05/2000   proctoscopy  . APPENDECTOMY  1984  . ATRIAL FIBRILLATION ABLATION  10/28/2018  . ATRIAL FIBRILLATION ABLATION N/A 10/28/2018   Procedure: ATRIAL FIBRILLATION ABLATION;  Surgeon: Constance Haw, MD;  Location: [facility];  Service: Cardiovascular;  Laterality: N/A;  . BIOPSY  05/24/2019   Procedure: BIOPSY;  Surgeon: Ronald Lobo, MD;  Location: [facility];  Service: Endoscopy;;  .  BIOPSY  08/10/2019   Procedure: BIOPSY;  Surgeon: Ronald Lobo, MD;  Location: [facility];  Service: Endoscopy;;  . COLONOSCOPY  2011  . COLONOSCOPY WITH PROPOFOL N/A 08/10/2019   Procedure: COLONOSCOPY WITH PROPOFOL;  Surgeon: Ronald Lobo, MD;  [facility]: [facility];  Service: Endoscopy;  Laterality: N/A;  . ESOPHAGOGASTRODUODENOSCOPY (EGD) WITH PROPOFOL N/A 05/24/2019   Procedure: ESOPHAGOGASTRODUODENOSCOPY (EGD) WITH PROPOFOL;  Surgeon: Ronald Lobo, MD;  [facility];  Service: Endoscopy;  Laterality: N/A;  . GASTROCNEMIUS RECESSION Left 11/03/2019   Procedure: LEFT GASTROCNEMIUS RECESSION;  Surgeon: Newt Minion, MD;  Location: [facility];  Service: Orthopedics;  Laterality: Left;  . HERNIA REPAIR    . INSERTION OF MESH N/A 01/29/2015   Procedure: INSERTION OF MESH;  Surgeon: Excell Seltzer, MD;  Location: [facility];  Service: General;  Laterality: N/A;  . IRRIGATION AND DEBRIDEMENT ABSCESS  02/18/2012   peri-rectal  . LEFT HEART CATH AND CORONARY ANGIOGRAPHY N/A 06/08/2018   Procedure: LEFT HEART CATH AND CORONARY ANGIOGRAPHY;  Surgeon: Leonie Man, MD;  Location: [facility];  Service: Cardiovascular;  Laterality: N/A;  . NASAL SEPTOPLASTY W/ TURBINOPLASTY  05/31/2019  . NASAL SEPTOPLASTY W/ TURBINOPLASTY Bilateral 05/31/2019   Procedure: NASAL SEPTOPLASTY WITH BILATERAL TURBINATE REDUCTION;  Surgeon: Leta Baptist, MD;  Location: [facility];  Service: ENT;  Laterality: Bilateral;  . PLANTAR FASCIA RELEASE Left 11/03/2019   Procedure: PLANTAR FASCIA RELEASE LEFT FOOT;  Surgeon: Newt Minion, MD;  Location: [place];  Service: Orthopedics;  Laterality: Left;  . POLYPECTOMY  08/10/2019   Procedure: POLYPECTOMY;  Surgeon: Ronald Lobo, MD;  [facility];  Service: Endoscopy;;  . SHOULDER ARTHROSCOPY Left ?2009   "repaired  AC joint; reattached bicept tendon"  . SHOULDER ARTHROSCOPY W/ LABRAL REPAIR Left 08/08/2007  . UMBILICAL HERNIA REPAIR  10/27/2010  . VENTRAL  HERNIA REPAIR N/A 01/29/2015   Procedure: LAPAROSCOPIC VENTRAL HERNIA;  Surgeon: Excell Seltzer, MD;  Location: [facility];  Service: General;  Laterality: N/A;    Current Medications: Current Meds  Medication Sig  . albuterol (VENTOLIN HFA) 108 (90 Base) MCG/ACT inhaler SMARTSIG:2 Puff(s) By Mouth Every 6 Hours PRN  . apixaban (ELIQUIS) 5 MG TABS tablet Take 1 tablet (5 mg total) by mouth 2 (two) times daily.  . Ascorbic Acid (VITAMIN C) 1000 MG tablet Take 4,000 mg by mouth daily.   . B-D INS SYR ULTRAFINE 1CC/30G 30G X 1/2" 1 ML MISC AS DIRECTED TO USE TO DELIVER HUMALOG AS NEEDED PER SLIDING SCALE SUBCUTANOUS . 100 DAYS  . Eszopiclone 3 MG TABS 1 TABLET IMMEDIATELY BEFORE BEDTIME ONCE A DAY FOR INSOMNIA AS NEEDED ORALLY  30 DAYS  . insulin lispro (HUMALOG) 100 UNIT/ML injection Inject into the skin.  . Insulin NPH Isophane & Regular (HUMULIN 70/30 [place]) Inject 14 Units into the skin 2 (two) times daily.  Marland Kitchen ipratropium (ATROVENT) 0.06 % nasal spray INSTILL 2 SPRAYS IN EACH NOSTRIL TWICE DAILY  . lithium carbonate (LITHOBID) 300 MG CR tablet Take 900 mg by mouth at bedtime.   Marland Kitchen losartan (COZAAR) 100 MG tablet Take 0.5 tablets (50 mg total) by mouth every evening.  . mupirocin ointment (BACTROBAN) 2 % Apply a pea-sized amount in each nasal passageway BID for 10 days  . nitroGLYCERIN (NITROSTAT) 0.4 MG SL tablet Place 1 tablet (0.4 mg total) under the tongue every 5 (five) minutes as needed for up to 25 days for chest pain.  Marland Kitchen NOVOLIN 70/30 (70-30) 100 UNIT/ML injection 36 UNITS TWICE A DAY SUBCUTANEOUS 30 DAYS  . olmesartan (BENICAR) 40 MG tablet Take 40 mg by mouth daily.  Marland Kitchen omeprazole (PRILOSEC) 40 MG capsule Take 40 mg by mouth as needed (HEARTBURN).   Marland Kitchen oxyCODONE-acetaminophen (PERCOCET) 5-325 MG tablet Take 1 tablet by mouth every 4 (four) hours as needed for severe pain.  Marland Kitchen OZEMPIC, 0.25 OR 0.5 MG/DOSE, 2 MG/1.5ML SOPN 0.25 MG ONCE A WEEK SUBCUTANEOUS 30 DAYS  . pioglitazone (ACTOS) 30 MG  tablet Take 30 mg by mouth every morning.   . pregabalin (LYRICA) 150 MG capsule Take 1 capsule (150 mg total) by mouth 2 (two) times daily.  . risperiDONE (RISPERDAL) 3 MG tablet Take 3 mg by mouth at bedtime.   . Semaglutide (OZEMPIC, 1 MG/DOSE, [place]) Inject 0.25 mg into the skin once a week. On saturday  . testosterone cypionate (DEPOTESTOSTERONE CYPIONATE) 200 MG/ML injection Inject 1 mL into the muscle once a week.  . traZODone (DESYREL) 100 MG tablet Take 1 tablet (100 mg total) by mouth at bedtime as needed for sleep. (Patient taking differently: Take 100 mg by mouth at bedtime. )  . zolpidem (AMBIEN) 10 MG tablet Take by mouth.  . [DISCONTINUED] doxycycline (VIBRA-TABS) 100 MG tablet Take 1 tablet (100 mg total) by mouth 2 (two) times daily.  . [DISCONTINUED] GAVILYTE-N WITH FLAVOR PACK 420 g solution See admin instructions.  . [DISCONTINUED] losartan (COZAAR) 100 MG tablet Take 1 tablet (100 mg total) by mouth every evening. (Patient taking differently: Take 100 mg by mouth daily. )  . [DISCONTINUED] predniSONE (STERAPRED UNI-PAK 48 TAB) 10 MG (48) TBPK tablet TAKE AS DIRECTED ON DOSEPACK     Allergies:   Morphine   Social History   Socioeconomic History  . Marital status: Widowed    Spouse name: Not on file  . Number of children: Not on file  . Years of education: Not on file  . Highest education level: Not on file  Occupational History  . Not on file  Social Needs  . Financial resource strain: Not on file  . Food insecurity    Worry: Not on file    Inability: Not on file  . Transportation needs    Medical: Not on file    Non-medical: Not on file  Tobacco Use  . Smoking status: Former Smoker    Packs/day: 0.50    Years: 1.00    Pack years: 0.50    Types: Cigarettes  . Smokeless tobacco: Never Used  . Tobacco comment: quit 1983  Substance and Sexual Activity  . Alcohol use: Not Currently    Comment: rare wine  . Drug use: Not Currently  Comment: not since 70'S   . Sexual activity: Not on file  Lifestyle  . Physical activity    Days per week: Not on file    Minutes per session: Not on file  . Stress: Not on file  Relationships  . Social Herbalist on phone: Not on file    Gets together: Not on file    Attends religious service: Not on file    Active member of club or organization: Not on file    Attends meetings of clubs or organizations: Not on file    Relationship status: Not on file  Other Topics Concern  . Not on file  Social History Narrative  . Not on file     Family History: The patient's family history includes Breast cancer in his mother; Ovarian cancer in his mother.  ROS:   Please see the history of present illness.    All other systems reviewed and are negative.  EKGs/Labs/Other Studies Reviewed:    The following studies were reviewed today: Coronary angiography report from the recent past was discussed and evaluated   Recent Labs: 05/10/2019: ALT 32 11/03/2019: BUN 20; Creatinine, Ser 1.18; Hemoglobin 13.5; Platelets 245; Potassium 3.9; Sodium 140  Recent Lipid Panel No results found for: CHOL, TRIG, HDL, CHOLHDL, VLDL, LDLCALC, LDLDIRECT  Physical Exam:    VS:  BP (!) 136/58 (BP Location: Left Arm, Patient Position: Sitting, Cuff Size: Normal)   Pulse 76   Wt (!) 313 lb (142 kg)   SpO2 97%   BMI 42.45 kg/m     Wt Readings from Last 3 Encounters:  11/29/19 (!) 313 lb (142 kg)  11/22/19 (!) 312 lb (141.5 kg)  11/20/19 (!) 312 lb (141.5 kg)     GEN: Patient is in no acute distress HEENT: Normal NECK: No JVD; No carotid bruits LYMPHATICS: No lymphadenopathy CARDIAC: Hear sounds regular, 2/6 systolic murmur at the apex. RESPIRATORY:  Clear to auscultation without rales, wheezing or rhonchi  ABDOMEN: Soft, non-tender, non-distended MUSCULOSKELETAL:  No edema; No deformity  SKIN: Warm and dry NEUROLOGIC:  Alert and oriented x 3 PSYCHIATRIC:  Normal affect   Signed, Jenean Lindau, MD   11/29/2019 11:02 AM    [place]

## 2019-11-29 NOTE — Telephone Encounter (Signed)
Patient D-Dimer was positive RN called home and work number and left a message. CT of chest ordered.

## 2019-11-29 NOTE — Addendum Note (Signed)
Addended by: Beckey Rutter on: 11/29/2019 11:44 AM   Modules accepted: Orders

## 2019-11-29 NOTE — Addendum Note (Signed)
Addended by: Beckey Rutter on: 11/29/2019 02:31 PM   Modules accepted: Orders

## 2019-11-29 NOTE — Addendum Note (Signed)
Addended by: Beckey Rutter on: 11/29/2019 03:52 PM   Modules accepted: Orders

## 2019-11-30 ENCOUNTER — Encounter: Payer: Self-pay | Admitting: Orthopedic Surgery

## 2019-11-30 ENCOUNTER — Ambulatory Visit (INDEPENDENT_AMBULATORY_CARE_PROVIDER_SITE_OTHER): Payer: No Typology Code available for payment source | Admitting: Orthopedic Surgery

## 2019-11-30 VITALS — Ht 72.0 in | Wt 313.0 lb

## 2019-11-30 DIAGNOSIS — M722 Plantar fascial fibromatosis: Secondary | ICD-10-CM

## 2019-11-30 DIAGNOSIS — M6702 Short Achilles tendon (acquired), left ankle: Secondary | ICD-10-CM

## 2019-11-30 DIAGNOSIS — M6701 Short Achilles tendon (acquired), right ankle: Secondary | ICD-10-CM

## 2019-11-30 DIAGNOSIS — I872 Venous insufficiency (chronic) (peripheral): Secondary | ICD-10-CM

## 2019-11-30 DIAGNOSIS — E1142 Type 2 diabetes mellitus with diabetic polyneuropathy: Secondary | ICD-10-CM

## 2019-11-30 LAB — BASIC METABOLIC PANEL
BUN/Creatinine Ratio: 25 — ABNORMAL HIGH (ref 10–24)
BUN: 28 mg/dL — ABNORMAL HIGH (ref 8–27)
CO2: 23 mmol/L (ref 20–29)
Calcium: 9.9 mg/dL (ref 8.6–10.2)
Chloride: 102 mmol/L (ref 96–106)
Creatinine, Ser: 1.14 mg/dL (ref 0.76–1.27)
GFR calc Af Amer: 80 mL/min/{1.73_m2} (ref >59)
GFR calc non Af Amer: 70 mL/min/{1.73_m2} (ref >59)
Glucose: 201 mg/dL — ABNORMAL HIGH (ref 65–99)
Potassium: 4.7 mmol/L (ref 3.5–5.2)
Sodium: 135 mmol/L (ref 134–144)

## 2019-11-30 NOTE — Progress Notes (Signed)
Office Visit Note   Patient: Gary Grimes           Date of Birth: January 08, 1961           MRN: [id number] Visit Date: 11/30/2019              Requested by: Shirline Frees, MD [place],  [place] [address] PCP: Shirline Frees, MD  Chief Complaint  Patient presents with  . Left Leg - Routine Post Op    11/13/19 gastroc recession PF release.       HPI: Patient is a 58 year old gentleman who is 3 weeks status post gastrocnemius recession and plantar fascial release for chronic plantar fasciitis on the left.  Patient denies any plantar fascial pain at this time however he does have pain in the calf he states this is about a 5/10 when he is ambulating.  Patient has just been to his cardiologist who has recommended that he walk 30 minutes a day.  Assessment & Plan: Visit Diagnoses:  1. Contracture of Achilles tendon, bilateral   2. Diabetic polyneuropathy associated with type 2 diabetes mellitus ([place])   3. Plantar fasciitis, bilateral   4. Venous stasis dermatitis of both lower extremities     Plan: Discussed with the patient the importance of a plant-based diet with greens beans nuts and fruit discussed the importance of not consuming any carbohydrates I agree with exercise and have also set the patient up for physical therapy for the left lower extremity.  Patient is given a prescription for an extra-large compression stocking.  Follow-Up Instructions: Return in about 4 weeks (around 12/28/2019).   Ortho Exam  Patient is alert, oriented, no adenopathy, well-dressed, normal affect, normal respiratory effort. Examination patient has dermatitis where the Band-Aid has irritated the skin the incision is well approximated there is no cellulitis no drainage no odor no signs of infection.  Patient has massive pitting edema in the left leg with venous insufficiency.  Calf measures 45 cm in circumference.  Imaging: Ct Angio Chest Pe W Or Wo Contrast  Result  Date: 11/29/2019 CLINICAL DATA:  Positive D-dimer. Shortness of breath over the last 2 weeks. EXAM: CT ANGIOGRAPHY CHEST WITH CONTRAST TECHNIQUE: Multidetector CT imaging of the chest was performed using the standard protocol during bolus administration of intravenous contrast. Multiplanar CT image reconstructions and MIPs were obtained to evaluate the vascular anatomy. CONTRAST:  168mL OMNIPAQUE IOHEXOL 350 MG/ML SOLN COMPARISON:  Radiography 01/07/2019 FINDINGS: Cardiovascular: Pulmonary arterial opacification is only moderate. We can exclude large pulmonary emboli. Small distal emboli might be inapparent. No aortic pathology is seen. The patient does have extensive coronary artery calcification particularly in the left system. Mediastinum/Nodes: No mass or adenopathy. Lungs/Pleura: The lungs are clear. No edema, infiltrate, collapse, nodule or pleural effusion. Upper Abdomen: Normal except for a 2 cm left adrenal adenoma and renal cysts. Musculoskeletal: Normal Review of the MIP images confirms the above findings. IMPRESSION: Pulmonary arterial opacification is only moderate. There are no large or central pulmonary emboli. Small or distal pulmonary emboli might be inapparent. There is no evidence of pulmonary atelectasis or other pleural or pulmonary pathology however. Coronary artery calcification. Electronically Signed   By: Nelson Chimes M.D.   On: 11/29/2019 17:08   No images are attached to the encounter.  Labs: Lab Results  Component Value Date   HGBA1C 6.0 (H) 05/29/2019     Lab Results  Component Value Date   ALBUMIN 4.2 05/10/2019  ALBUMIN 4.0 04/05/2019   ALBUMIN 4.4 12/26/2015    No results found for: MG No results found for: VD25OH  No results found for: PREALBUMIN CBC EXTENDED Latest Ref Rng & Units 11/03/2019 05/29/2019 05/10/2019  WBC 4.0 - 10.5 K/uL 6.2 10.2 8.6  RBC 4.22 - 5.81 MIL/uL 4.11(L) 4.50 4.61  HGB 13.0 - 17.0 g/dL 13.5 14.6 14.6  HCT 39.0 - 52.0 % 40.6 43.3 44.6   PLT 150 - 400 K/uL 245 251 254  NEUTROABS 1.7 - 7.7 K/uL - - 4.3  LYMPHSABS 0.7 - 4.0 K/uL - - 3.1     Body mass index is 42.45 kg/m.  Orders:  No orders of the defined types were placed in this encounter.  No orders of the defined types were placed in this encounter.    Procedures: No procedures performed  Clinical Data: No additional findings.  ROS:  All other systems negative, except as noted in the HPI. Review of Systems  Objective: Vital Signs: Ht 6' (1.829 m)   Wt (!) 313 lb (142 kg)   BMI 42.45 kg/m   Specialty Comments:  No specialty comments available.  PMFS History: Patient Active Problem List   Diagnosis Date Noted  . DOE (dyspnea on exertion) 11/29/2019  . Achilles tendon contracture, left   . Acquired equinus deformity of both feet 10/22/2019  . S/P nasal septoplasty 05/31/2019  . Preoperative cardiovascular examination 05/17/2019  . Mild CAD 05/17/2019  . Chronic pansinusitis 04/28/2019  . Plantar fasciitis of left foot 02/28/2019  . Type II diabetes mellitus with neurological manifestations ([place]) 02/28/2019  . S/P ablation of atrial fibrillation   . Paroxysmal atrial fibrillation ([place]) 06/06/2018  . Essential hypertension 06/06/2018  . Diabetes mellitus due to underlying condition with unspecified complications ([place]) [id number]  . Sleep apnea 06/06/2018  . Overweight 06/06/2018  . Bipolar disorder, manic ([place]) 05/29/2016  . MDD (major depressive disorder), recurrent severe, without psychosis ([place]) 12/27/2015  . Ventral incisional hernia 01/29/2015  . Perirectal abscess 02/18/2012  . SPONDYLOSIS 09/19/2008   Past Medical History:  Diagnosis Date  . Anginal pain ([place])    ER visit 01/14/2014 visit on chart   . Anxiety    pt denies  . Bipolar disorder ([place])   . Chronic lower back pain   . Depression    did get seen in er 4/13 for evaluation-psyc situational none recent  . GERD (gastroesophageal reflux disease)   . Headache    none  recent  . High cholesterol   . High triglycerides   . Hypertension   . Levator syndrome 2001   history   . Neuropathy   . OSA on CPAP   . Panic attacks   . Perirectal abscess   . S/P ablation of atrial fibrillation   . Type II diabetes mellitus ([facility])     Family History  Problem Relation Age of Onset  . Breast cancer Mother   . Ovarian cancer Mother     Past Surgical History:  Procedure Laterality Date  . ANAL FISSURE REPAIR  08/05/2000   proctoscopy  . APPENDECTOMY  1984  . ATRIAL FIBRILLATION ABLATION  10/28/2018  . ATRIAL FIBRILLATION ABLATION N/A 10/28/2018   Procedure: ATRIAL FIBRILLATION ABLATION;  Surgeon: Constance Haw, MD;  Location: [facility];  Service: Cardiovascular;  Laterality: N/A;  . BIOPSY  05/24/2019   Procedure: BIOPSY;  Surgeon: Ronald Lobo, MD;  [facility];  Service: Endoscopy;;  . BIOPSY  08/10/2019   Procedure: BIOPSY;  Surgeon: Ronald Lobo, MD;  [facility];  Service: Endoscopy;;  . COLONOSCOPY  2011  . COLONOSCOPY WITH PROPOFOL N/A 08/10/2019   Procedure: COLONOSCOPY WITH PROPOFOL;  Surgeon: Ronald Lobo, MD;  [facility];  Service: Endoscopy;  Laterality: N/A;  . ESOPHAGOGASTRODUODENOSCOPY (EGD) WITH PROPOFOL N/A 05/24/2019   Procedure: ESOPHAGOGASTRODUODENOSCOPY (EGD) WITH PROPOFOL;  Surgeon: Ronald Lobo, MD;  [facility];  Service: Endoscopy;  Laterality: N/A;  . GASTROCNEMIUS RECESSION Left 11/03/2019   Procedure: LEFT GASTROCNEMIUS RECESSION;  Surgeon: Newt Minion, MD;  Location: [facility];  Service: Orthopedics;  Laterality: Left;  . HERNIA REPAIR    . INSERTION OF MESH N/A 01/29/2015   Procedure: INSERTION OF MESH;  Surgeon: Excell Seltzer, MD;  [facility];  Service: General;  Laterality: N/A;  . IRRIGATION AND DEBRIDEMENT ABSCESS  02/18/2012   peri-rectal  . LEFT HEART CATH AND CORONARY ANGIOGRAPHY N/A 06/08/2018   Procedure: LEFT HEART CATH AND CORONARY ANGIOGRAPHY;   Surgeon: Leonie Man, MD;  Location: [facility];  Service: Cardiovascular;  Laterality: N/A;  . NASAL SEPTOPLASTY W/ TURBINOPLASTY  05/31/2019  . NASAL SEPTOPLASTY W/ TURBINOPLASTY Bilateral 05/31/2019   Procedure: NASAL SEPTOPLASTY WITH BILATERAL TURBINATE REDUCTION;  Surgeon: Leta Baptist, MD;  Location: [facility];  Service: ENT;  Laterality: Bilateral;  . PLANTAR FASCIA RELEASE Left 11/03/2019   Procedure: PLANTAR FASCIA RELEASE LEFT FOOT;  Surgeon: Newt Minion, MD;  Location: [facility];  Service: Orthopedics;  Laterality: Left;  . POLYPECTOMY  08/10/2019   Procedure: POLYPECTOMY;  Surgeon: Ronald Lobo, MD;  [facility];  Service: Endoscopy;;  . SHOULDER ARTHROSCOPY Left ?2009   "repaired  AC joint; reattached bicept tendon"  . SHOULDER ARTHROSCOPY W/ LABRAL REPAIR Left 08/08/2007  . UMBILICAL HERNIA REPAIR  10/27/2010  . VENTRAL HERNIA REPAIR N/A 01/29/2015   Procedure: LAPAROSCOPIC VENTRAL HERNIA;  Surgeon: Excell Seltzer, MD;  [facility];  Service: General;  Laterality: N/A;   Social History   Occupational History  . Not on file  Tobacco Use  . Smoking status: Former Smoker    Packs/day: 0.50    Years: 1.00    Pack years: 0.50    Types: Cigarettes  . Smokeless tobacco: Never Used  . Tobacco comment: quit 1983  Substance and Sexual Activity  . Alcohol use: Not Currently    Comment: rare wine  . Drug use: Not Currently    Comment: not since 70'S  . Sexual activity: Not on file

## 2019-12-04 ENCOUNTER — Ambulatory Visit: Payer: No Typology Code available for payment source | Admitting: Orthopedic Surgery

## 2019-12-12 LAB — BASIC METABOLIC PANEL
BUN/Creatinine Ratio: 18 (ref 10–24)
BUN: 25 mg/dL (ref 8–27)
CO2: 24 mmol/L (ref 20–29)
Calcium: 9.7 mg/dL (ref 8.6–10.2)
Chloride: 103 mmol/L (ref 96–106)
Creatinine, Ser: 1.41 mg/dL — ABNORMAL HIGH (ref 0.76–1.27)
GFR calc Af Amer: 62 mL/min/{1.73_m2} (ref >59)
GFR calc non Af Amer: 54 mL/min/{1.73_m2} — ABNORMAL LOW (ref >59)
Glucose: 115 mg/dL — ABNORMAL HIGH (ref 65–99)
Potassium: 4.3 mmol/L (ref 3.5–5.2)
Sodium: 140 mmol/L (ref 134–144)

## 2019-12-15 ENCOUNTER — Other Ambulatory Visit: Payer: Self-pay | Admitting: Cardiology

## 2019-12-19 ENCOUNTER — Telehealth: Payer: Self-pay | Admitting: *Deleted

## 2019-12-19 DIAGNOSIS — I10 Essential (primary) hypertension: Secondary | ICD-10-CM

## 2019-12-19 NOTE — Telephone Encounter (Signed)
-----   Message from Jenean Lindau, MD sent at 12/12/2019  8:21 AM EST ----- He needs to be rechecked for a Chem-7 in 2 weeks.  Kidney function is mildly elevated because of diuretics. Jenean Lindau, MD 12/12/2019 8:21 AM

## 2019-12-19 NOTE — Telephone Encounter (Signed)
Telephone call to patient. Left message with lab results and need to repeat BMP in 2 weeks. Asked to call with any questions.

## 2019-12-19 NOTE — Telephone Encounter (Signed)
Patient called back and confirmed he received message to repeat BMp in 2 weeks.

## 2019-12-28 ENCOUNTER — Ambulatory Visit (INDEPENDENT_AMBULATORY_CARE_PROVIDER_SITE_OTHER): Payer: No Typology Code available for payment source | Admitting: Orthopedic Surgery

## 2019-12-28 ENCOUNTER — Other Ambulatory Visit: Payer: Self-pay

## 2019-12-28 ENCOUNTER — Encounter: Payer: Self-pay | Admitting: Orthopedic Surgery

## 2019-12-28 VITALS — Ht 72.0 in | Wt 313.0 lb

## 2019-12-28 DIAGNOSIS — M722 Plantar fascial fibromatosis: Secondary | ICD-10-CM

## 2019-12-28 DIAGNOSIS — I872 Venous insufficiency (chronic) (peripheral): Secondary | ICD-10-CM

## 2019-12-28 DIAGNOSIS — M6701 Short Achilles tendon (acquired), right ankle: Secondary | ICD-10-CM

## 2019-12-28 DIAGNOSIS — E1142 Type 2 diabetes mellitus with diabetic polyneuropathy: Secondary | ICD-10-CM

## 2019-12-28 DIAGNOSIS — M6702 Short Achilles tendon (acquired), left ankle: Secondary | ICD-10-CM

## 2019-12-28 NOTE — Progress Notes (Signed)
Office Visit Note   Patient: Gary Grimes           Date of Birth: 28-Nov-1961           MRN: [id number] Visit Date: 12/28/2019              Requested by: Shirline Frees, MD [address],  [place] [address] PCP: Shirline Frees, MD  Chief Complaint  Patient presents with  . Left Foot - Follow-up  . Right Foot - Follow-up      HPI: Patient is a 58 year old gentleman who presents in follow-up status post gastrocnemius recession and plantar fascial release for chronic plantar fasciitis and Achilles contracture.  Patient states he has been wearing the medical compression stocking.  He states that just recently his symptoms have completely resolved he states he feels great without symptoms.  Assessment & Plan: Visit Diagnoses: No diagnosis found.  Plan: Patient will continue wearing the knee-high medical compression stockings continue working on dorsiflexion stretching of the ankle.  Follow-Up Instructions: Return if symptoms worsen or fail to improve.   Ortho Exam  Patient is alert, oriented, no adenopathy, well-dressed, normal affect, normal respiratory effort. Examination patient has minimal swelling in his leg he has been wearing the knee-high compression stockings the incision is well-healed.  Patient has excellent range of motion of his ankle with dorsiflexion of about 30 degrees the plantar fascia is nontender.  Imaging: No results found. No images are attached to the encounter.  Labs: Lab Results  Component Value Date   HGBA1C 6.0 (H) 05/29/2019     Lab Results  Component Value Date   ALBUMIN 4.2 05/10/2019   ALBUMIN 4.0 04/05/2019   ALBUMIN 4.4 12/26/2015    No results found for: MG No results found for: VD25OH  No results found for: PREALBUMIN CBC EXTENDED Latest Ref Rng & Units 11/03/2019 05/29/2019 05/10/2019  WBC 4.0 - 10.5 K/uL 6.2 10.2 8.6  RBC 4.22 - 5.81 MIL/uL 4.11(L) 4.50 4.61  HGB 13.0 - 17.0 g/dL 13.5 14.6 14.6  HCT  39.0 - 52.0 % 40.6 43.3 44.6  PLT 150 - 400 K/uL 245 251 254  NEUTROABS 1.7 - 7.7 K/uL - - 4.3  LYMPHSABS 0.7 - 4.0 K/uL - - 3.1     Body mass index is 42.45 kg/m.  Orders:  No orders of the defined types were placed in this encounter.  No orders of the defined types were placed in this encounter.    Procedures: No procedures performed  Clinical Data: No additional findings.  ROS:  All other systems negative, except as noted in the HPI. Review of Systems  Objective: Vital Signs: Ht 6' (1.829 m)   Wt (!) 313 lb (142 kg)   BMI 42.45 kg/m   Specialty Comments:  No specialty comments available.  PMFS History: Patient Active Problem List   Diagnosis Date Noted  . DOE (dyspnea on exertion) 11/29/2019  . Achilles tendon contracture, left   . Acquired equinus deformity of both feet 10/22/2019  . S/P nasal septoplasty 05/31/2019  . Preoperative cardiovascular examination 05/17/2019  . Mild CAD 05/17/2019  . Chronic pansinusitis 04/28/2019  . Plantar fasciitis of left foot 02/28/2019  . Type II diabetes mellitus with neurological manifestations ([place]) 02/28/2019  . S/P ablation of atrial fibrillation   . Paroxysmal atrial fibrillation ([place]) 06/06/2018  . Essential hypertension 06/06/2018  . Diabetes mellitus due to underlying condition with unspecified complications ([place]) [id number]  . Sleep apnea 06/06/2018  .  Overweight 06/06/2018  . Bipolar disorder, manic ([place]) 05/29/2016  . MDD (major depressive disorder), recurrent severe, without psychosis ([place]) 12/27/2015  . Ventral incisional hernia 01/29/2015  . Perirectal abscess 02/18/2012  . SPONDYLOSIS 09/19/2008   Past Medical History:  Diagnosis Date  . Anginal pain ([place])    ER visit 01/14/2014 visit on chart   . Anxiety    pt denies  . Bipolar disorder ([place])   . Chronic lower back pain   . Depression    did get seen in er 4/13 for evaluation-psyc situational none recent  . GERD (gastroesophageal reflux  disease)   . Headache    none recent  . High cholesterol   . High triglycerides   . Hypertension   . Levator syndrome 2001   history   . Neuropathy   . OSA on CPAP   . Panic attacks   . Perirectal abscess   . S/P ablation of atrial fibrillation   . Type II diabetes mellitus ([facility])     Family History  Problem Relation Age of Onset  . Breast cancer Mother   . Ovarian cancer Mother     Past Surgical History:  Procedure Laterality Date  . ANAL FISSURE REPAIR  08/05/2000   proctoscopy  . APPENDECTOMY  1984  . ATRIAL FIBRILLATION ABLATION  10/28/2018  . ATRIAL FIBRILLATION ABLATION N/A 10/28/2018   Procedure: ATRIAL FIBRILLATION ABLATION;  Surgeon: Constance Haw, MD;  Location: [facility];  Service: Cardiovascular;  Laterality: N/A;  . BIOPSY  05/24/2019   Procedure: BIOPSY;  Surgeon: Ronald Lobo, MD;  [facility];  Service: Endoscopy;;  . BIOPSY  08/10/2019   Procedure: BIOPSY;  Surgeon: Ronald Lobo, MD;  [facility];  Service: Endoscopy;;  . COLONOSCOPY  2011  . COLONOSCOPY WITH PROPOFOL N/A 08/10/2019   Procedure: COLONOSCOPY WITH PROPOFOL;  Surgeon: Ronald Lobo, MD;  [facility];  Service: Endoscopy;  Laterality: N/A;  . ESOPHAGOGASTRODUODENOSCOPY (EGD) WITH PROPOFOL N/A 05/24/2019   Procedure: ESOPHAGOGASTRODUODENOSCOPY (EGD) WITH PROPOFOL;  Surgeon: Ronald Lobo, MD;  [facility];  Service: Endoscopy;  Laterality: N/A;  . GASTROCNEMIUS RECESSION Left 11/03/2019   Procedure: LEFT GASTROCNEMIUS RECESSION;  Surgeon: Newt Minion, MD;  Location: [facility];  Service: Orthopedics;  Laterality: Left;  . HERNIA REPAIR    . INSERTION OF MESH N/A 01/29/2015   Procedure: INSERTION OF MESH;  Surgeon: Excell Seltzer, MD;  [facility];  Service: General;  Laterality: N/A;  . IRRIGATION AND DEBRIDEMENT ABSCESS  02/18/2012   peri-rectal  . LEFT HEART CATH AND CORONARY ANGIOGRAPHY N/A 06/08/2018   Procedure: LEFT HEART  CATH AND CORONARY ANGIOGRAPHY;  Surgeon: Leonie Man, MD;  Location: [facility];  Service: Cardiovascular;  Laterality: N/A;  . NASAL SEPTOPLASTY W/ TURBINOPLASTY  05/31/2019  . NASAL SEPTOPLASTY W/ TURBINOPLASTY Bilateral 05/31/2019   Procedure: NASAL SEPTOPLASTY WITH BILATERAL TURBINATE REDUCTION;  Surgeon: Leta Baptist, MD;  Location: [facility];  Service: ENT;  Laterality: Bilateral;  . PLANTAR FASCIA RELEASE Left 11/03/2019   Procedure: PLANTAR FASCIA RELEASE LEFT FOOT;  Surgeon: Newt Minion, MD;  Location: [facility];  Service: Orthopedics;  Laterality: Left;  . POLYPECTOMY  08/10/2019   Procedure: POLYPECTOMY;  Surgeon: Ronald Lobo, MD;  [facility];  Service: Endoscopy;;  . SHOULDER ARTHROSCOPY Left ?2009   "repaired  AC joint; reattached bicept tendon"  . SHOULDER ARTHROSCOPY W/ LABRAL REPAIR Left 08/08/2007  . UMBILICAL HERNIA REPAIR  10/27/2010  . VENTRAL  HERNIA REPAIR N/A 01/29/2015   Procedure: LAPAROSCOPIC VENTRAL HERNIA;  Surgeon: Excell Seltzer, MD;  [facility];  Service: General;  Laterality: N/A;   Social History   Occupational History  . Not on file  Tobacco Use  . Smoking status: Former Smoker    Packs/day: 0.50    Years: 1.00    Pack years: 0.50    Types: Cigarettes  . Smokeless tobacco: Never Used  . Tobacco comment: quit 1983  Substance and Sexual Activity  . Alcohol use: Not Currently    Comment: rare wine  . Drug use: Not Currently    Comment: not since 70'S  . Sexual activity: Not on file

## 2020-01-02 LAB — BASIC METABOLIC PANEL
BUN/Creatinine Ratio: 14 (ref 10–24)
BUN: 17 mg/dL (ref 8–27)
CO2: 24 mmol/L (ref 20–29)
Calcium: 9.9 mg/dL (ref 8.6–10.2)
Chloride: 103 mmol/L (ref 96–106)
Creatinine, Ser: 1.21 mg/dL (ref 0.76–1.27)
GFR calc Af Amer: 75 mL/min/{1.73_m2} (ref >59)
GFR calc non Af Amer: 65 mL/min/{1.73_m2} (ref >59)
Glucose: 134 mg/dL — ABNORMAL HIGH (ref 65–99)
Potassium: 4.2 mmol/L (ref 3.5–5.2)
Sodium: 142 mmol/L (ref 134–144)

## 2020-01-08 ENCOUNTER — Telehealth: Payer: Self-pay | Admitting: Podiatry

## 2020-01-08 MED ORDER — PREGABALIN 150 MG PO CAPS: 150.0000 mg | ORAL_CAPSULE | Freq: Two times a day (BID) | ORAL | 2 refills | Status: DC

## 2020-01-08 NOTE — Telephone Encounter (Signed)
Pt called to say that he will be out of his pain meds tomorrow 01/09/20 can he get a refill.

## 2020-01-08 NOTE — Telephone Encounter (Signed)
Left message informing pt the Lyrica had been sent to [id number]. Left orders for Lyrica with [facility].

## 2020-01-08 NOTE — Addendum Note (Signed)
Addended by: Harriett Sine D on: 01/08/2020 12:59 PM   Modules accepted: Orders

## 2020-01-12 ENCOUNTER — Telehealth: Payer: Self-pay | Admitting: Cardiology

## 2020-01-12 NOTE — Telephone Encounter (Signed)
He can stop it and go back on his ACE inhibitor a full dose.

## 2020-01-12 NOTE — Telephone Encounter (Signed)
Pt states since starting the Furosemide he feels like his quality of life has decreased because all he does is urinate.  Explained what Furosemide was, how it works and why we place pts on it.  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  Pt would really like to stop this medication.  Pt did not have any BP readings but states he knows when it gets high and he has felt fine since starting this med.  Advised I would send to Dr. Lennox Pippins to review.

## 2020-01-12 NOTE — Telephone Encounter (Signed)
Pt c/o medication issue:  1. Name of Medication: furosemide (LASIX) 20 MG tablet  2. How are you currently taking this medication (dosage and times per day)? As directed  3. Are you having a reaction (difficulty breathing--STAT)? Frequent /conisitent urination  4. What is your medication issue? Pt states he is constantly having to urinate since he started taking this medication. He says it is leading to a poor quality of life. He would like to know as soon as possible if he can stop taking the medication

## 2020-01-15 DIAGNOSIS — E785 Hyperlipidemia, unspecified: Secondary | ICD-10-CM

## 2020-01-15 DIAGNOSIS — I10 Essential (primary) hypertension: Secondary | ICD-10-CM | POA: Insufficient documentation

## 2020-01-15 DIAGNOSIS — E782 Mixed hyperlipidemia: Secondary | ICD-10-CM | POA: Insufficient documentation

## 2020-01-15 DIAGNOSIS — F319 Bipolar disorder, unspecified: Secondary | ICD-10-CM

## 2020-01-15 DIAGNOSIS — K219 Gastro-esophageal reflux disease without esophagitis: Secondary | ICD-10-CM | POA: Insufficient documentation

## 2020-01-15 DIAGNOSIS — G4733 Obstructive sleep apnea (adult) (pediatric): Secondary | ICD-10-CM | POA: Insufficient documentation

## 2020-01-15 HISTORY — DX: Bipolar disorder, unspecified: F31.9

## 2020-01-15 HISTORY — DX: Hyperlipidemia, unspecified: E78.5

## 2020-01-15 HISTORY — DX: Mixed hyperlipidemia: E78.2

## 2020-01-15 HISTORY — DX: Obstructive sleep apnea (adult) (pediatric): G47.33

## 2020-01-15 MED ORDER — LOSARTAN POTASSIUM 100 MG PO TABS: 100.0000 mg | ORAL_TABLET | Freq: Every evening | ORAL | Status: DC

## 2020-01-15 NOTE — Telephone Encounter (Signed)
Patient is currently in [facility] ED with insomnia. Instructed to stop taking lasix and go back to 100 mg (1 tablet) losartan daily. No further questions at this time

## 2020-01-26 ENCOUNTER — Telehealth: Payer: Self-pay | Admitting: Cardiology

## 2020-01-26 MED ORDER — NITROGLYCERIN 0.4 MG SL SUBL: 0.4000 mg | SUBLINGUAL_TABLET | SUBLINGUAL | 11 refills | Status: DC | PRN

## 2020-01-26 NOTE — Telephone Encounter (Signed)
Information relayed, RN advised patient to go to [facility] for evaluation. Patient requested that refills on nitro be sent. No further questions at this time

## 2020-01-26 NOTE — Telephone Encounter (Signed)
Get evaluated by pcp or urgent care asap

## 2020-01-26 NOTE — Telephone Encounter (Signed)
New Message  Pt c/o of Chest Pain: STAT if CP now or developed within 24 hours  1. Are you having CP right now? No  2. Are you experiencing any other symptoms (ex. SOB, nausea, vomiting, sweating)? SOB  3. How long have you been experiencing CP? today  4. Is your CP continuous or coming and going? Coming and going   5. Have you taken Nitroglycerin? No ?

## 2020-01-26 NOTE — Telephone Encounter (Signed)
Spoke with patient who states he had extreme stabbing chest pain in the shower this morning at 0715 today that lasted 2-3 minutes with SOB. Denies n/v, states he felt dizzy at the time and felt increase in HR. States he feels fine now. After that event he rested for a few minutes with full resolution of symptoms. Reports BS was 273 after that event which he reports his high for his morning reading. Had just woken up and gotten in the shower today, did not take NTG and did not measure HR or BP. I asked him about medications and he states he takes olmesartan, not losartan. I advised that he should not take both and he states he only takes olmesartan. 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 I advised that I will forward message to Dr. [name] for advice. Patient verbalized understanding and agreement and thanked me for the call.

## 2020-02-06 ENCOUNTER — Other Ambulatory Visit: Payer: Self-pay

## 2020-02-06 ENCOUNTER — Ambulatory Visit (INDEPENDENT_AMBULATORY_CARE_PROVIDER_SITE_OTHER): Payer: No Typology Code available for payment source | Admitting: Pulmonary Disease

## 2020-02-06 ENCOUNTER — Telehealth: Payer: Self-pay | Admitting: Pulmonary Disease

## 2020-02-06 ENCOUNTER — Encounter: Payer: Self-pay | Admitting: Pulmonary Disease

## 2020-02-06 VITALS — BP 122/66 | Temp 97.9°F | Ht 72.0 in | Wt 306.4 lb

## 2020-02-06 DIAGNOSIS — Z9989 Dependence on other enabling machines and devices: Secondary | ICD-10-CM

## 2020-02-06 DIAGNOSIS — G4701 Insomnia due to medical condition: Secondary | ICD-10-CM

## 2020-02-06 DIAGNOSIS — G4733 Obstructive sleep apnea (adult) (pediatric): Secondary | ICD-10-CM

## 2020-02-06 MED ORDER — BELSOMRA 15 MG PO TABS: 15.0000 mg | ORAL_TABLET | Freq: Every day | ORAL | 1 refills | Status: DC

## 2020-02-06 NOTE — Patient Instructions (Signed)
Insomnia  Obstructive sleep apnea-compliant with CPAP use  We will start you on Belsomra 15 mg nightly  Call with significant concerns  I will see you back in the office in 6 weeks  Continue weight loss efforts, continue regular exercises Continue using his CPAP on a regular basis

## 2020-02-06 NOTE — Progress Notes (Signed)
Subjective:    Patient ID: Gary Grimes, male    DOB: 05-20-1961, 59 y.o.   MRN: [id number]  Patient with a history of chronic insomnia  Has used multiple medications to help him fall asleep Ambien worked initially but stopped working after a while Physiological scientist did not help at all  Multiple over-the-counter including Unisom, hydroxyzine  He is on trazodone 200 mg nightly  About 3 weeks ago, he used extra medications to help him get some sleep and ended up in the hospital  Obstructive sleep apnea was diagnosed about 3 years ago, uses CPAP religiously He has not followed up for sleep apnea since then   Usually tries to go to bed about 10 PM Takes him over an hour to fall asleep Wakes up about 2-3 times during the night Final awakening time about 7 AM  Usually never feels restored with his sleep  He does have a history of ADHD, bipolar disorder-Not on any stimulating medications, concern for manic episodes  He gets very frustrated with not being able to sleep and on a few occasions has used multiple over-the-counter medications to help him get some sleep  He has started to exercise about 30 minutes a day but only walks slowlyas this is what he is able to tolerate, has managed to lose about 7 pounds  Past Medical History:  Diagnosis Date  . Anginal pain ([place])    ER visit 01/14/2014 visit on chart   . Anxiety    pt denies  . Bipolar disorder ([place])   . Chronic lower back pain   . Depression    did get seen in er 4/13 for evaluation-psyc situational none recent  . GERD (gastroesophageal reflux disease)   . Headache    none recent  . High cholesterol   . High triglycerides   . Hypertension   . Levator syndrome 2001   history   . Neuropathy   . OSA on CPAP   . Panic attacks   . Perirectal abscess   . S/P ablation of atrial fibrillation   . Type II diabetes mellitus ([place])    Social History   Socioeconomic History  . Marital status: Widowed    Spouse name: Not on  file  . Number of children: Not on file  . Years of education: Not on file  . Highest education level: Not on file  Occupational History  . Not on file  Tobacco Use  . Smoking status: Former Smoker    Packs/day: 0.50    Years: 1.00    Pack years: 0.50    Types: Cigarettes  . Smokeless tobacco: Never Used  . Tobacco comment: quit 1983  Substance and Sexual Activity  . Alcohol use: Not Currently    Comment: rare wine  . Drug use: Not Currently    Comment: not since 70'S  . Sexual activity: Not on file  Other Topics Concern  . Not on file  Social History Narrative  . Not on file   Social Determinants of Health   Financial Resource Strain:   . Difficulty of Paying Living Expenses: Not on file  Food Insecurity:   . Worried About Charity fundraiser in the Last Year: Not on file  . Ran Out of Food in the Last Year: Not on file  Transportation Needs:   . Lack of Transportation (Medical): Not on file  . Lack of Transportation (Non-Medical): Not on file  Physical Activity:   . Days of Exercise per  Week: Not on file  . Minutes of Exercise per Session: Not on file  Stress:   . Feeling of Stress : Not on file  Social Connections:   . Frequency of Communication with Friends and Family: Not on file  . Frequency of Social Gatherings with Friends and Family: Not on file  . Attends Religious Services: Not on file  . Active Member of Clubs or Organizations: Not on file  . Attends Archivist Meetings: Not on file  . Marital Status: Not on file  Intimate Partner Violence:   . Fear of Current or Ex-Partner: Not on file  . Emotionally Abused: Not on file  . Physically Abused: Not on file  . Sexually Abused: Not on file   Family History  Problem Relation Age of Onset  . Breast cancer Mother   . Ovarian cancer Mother     Review of Systems  Constitutional: Positive for unexpected weight change. Negative for fever.  HENT: Negative for congestion, dental problem, ear pain,  nosebleeds, postnasal drip, rhinorrhea, sinus pressure, sneezing, sore throat and trouble swallowing.   Eyes: Negative for redness and itching.  Respiratory: Positive for chest tightness and shortness of breath. Negative for cough and wheezing.   Cardiovascular: Positive for palpitations. Negative for leg swelling.  Gastrointestinal: Negative for nausea and vomiting.  Genitourinary: Positive for dysuria.  Musculoskeletal: Negative for joint swelling.  Skin: Negative for rash.  Allergic/Immunologic: Negative.  Negative for environmental allergies, food allergies and immunocompromised state.  Neurological: Negative for headaches.  Hematological: Does not bruise/bleed easily.  Psychiatric/Behavioral: Negative for dysphoric mood. The patient is nervous/anxious.        Objective:   Physical Exam Constitutional:      Appearance: He is obese.  HENT:     Head: Normocephalic.     Nose: Nose normal. No congestion.     Mouth/Throat:     Mouth: Mucous membranes are moist.  Eyes:     Extraocular Movements: Extraocular movements intact.     Pupils: Pupils are equal, round, and reactive to light.  Cardiovascular:     Rate and Rhythm: Normal rate and regular rhythm.     Pulses: Normal pulses.     Heart sounds: Normal heart sounds. No murmur. No friction rub.  Pulmonary:     Effort: Pulmonary effort is normal. No respiratory distress.     Breath sounds: Normal breath sounds. No stridor. No wheezing or rhonchi.  Musculoskeletal:        General: Normal range of motion.     Cervical back: Normal range of motion and neck supple. No rigidity or tenderness.  Skin:    General: Skin is warm and dry.  Neurological:     General: No focal deficit present.     Mental Status: He is alert.  Psychiatric:        Mood and Affect: Mood normal.        Behavior: Behavior normal.    Vitals:   02/06/20 1147  BP: 122/66  Temp: 97.9 F (36.6 C)  SpO2: 97%   Results of the Epworth flowsheet 02/06/2020   Sitting and reading 0  Watching TV 0  Sitting, inactive in a public place (e.g. a theatre or a meeting) 0  As a passenger in a car for an hour without a break 0  Lying down to rest in the afternoon when circumstances permit 0  Sitting and talking to someone 0  Sitting quietly after a lunch without alcohol 0  In  a car, while stopped for a few minutes in traffic 3  Total score 3   Compliance data reviewed showing excellent compliance with CPAP Is on CPAP of 13  Residual AHI of 7.1     Assessment & Plan:  Chronic insomnia  Underlying history of depression, bipolar disorder -Treatment optimized at present  Obstructive sleep apnea for which he is on CPAP -AHI still elevated  Download was obtained following the visit  Obesity  Recommendation We will start the patient on Belsomra 15 mg nightly  We will increase CPAP pressures from 13-14 he has not followed up with anyone recently, will contact medical supply company to see how this can be facilitated  I will see him back in the office in about 6 weeks  The risk of doubling up on his medications discussed, risk of overdosing on medications discussed  Encouraged to stay active  Encouraged to exercise regularly

## 2020-03-19 ENCOUNTER — Other Ambulatory Visit: Payer: Self-pay

## 2020-03-19 ENCOUNTER — Encounter: Payer: Self-pay | Admitting: Pulmonary Disease

## 2020-03-19 ENCOUNTER — Ambulatory Visit (INDEPENDENT_AMBULATORY_CARE_PROVIDER_SITE_OTHER): Payer: No Typology Code available for payment source | Admitting: Pulmonary Disease

## 2020-03-19 VITALS — BP 122/62 | HR 77 | Temp 97.3°F | Ht 72.0 in | Wt 298.6 lb

## 2020-03-19 DIAGNOSIS — G4733 Obstructive sleep apnea (adult) (pediatric): Secondary | ICD-10-CM

## 2020-03-19 DIAGNOSIS — G4701 Insomnia due to medical condition: Secondary | ICD-10-CM

## 2020-03-19 DIAGNOSIS — Z9989 Dependence on other enabling machines and devices: Secondary | ICD-10-CM

## 2020-03-19 MED ORDER — BELSOMRA 15 MG PO TABS: 20.0000 mg | ORAL_TABLET | Freq: Every day | ORAL | 1 refills | Status: DC

## 2020-03-19 NOTE — Patient Instructions (Signed)
Insomnia  We will increase dose of Belsomra to 20 mg  Patient self with respect to change in position  Discussed with cardiology about medications that may affect your heart rate on your blood pressure as this may be contributing to the lightheadedness and dizziness  Continue graded physical activity  I will see you back in about 3 months

## 2020-03-19 NOTE — Progress Notes (Signed)
Subjective:    Patient ID: Gary Grimes, male    DOB: 1961-07-10, 59 y.o.   MRN: [id number]  Patient with a history of chronic insomnia  Has used multiple medications to help him fall asleep Ambien worked initially but stopped working after a while Physiological scientist did not help at all Has recently been on Belsomra-this seems to be working well Requests a dose increase from 15 to 20 mg which will be sent into pharmacy  Multiple over-the-counter including Unisom, hydroxyzine  He is on trazodone 200 mg nightly-chronic medication  Has had some lightheadedness/dizziness when he is getting up from a seated position.  No recent medication changes.  No symptoms suggesting ear infection  Historically About 3 weeks ago, he used extra medications to help him get some sleep and ended up in the hospital  Obstructive sleep apnea was diagnosed about 3 years ago, uses CPAP religiously He has not followed up for sleep apnea since then   Usually tries to go to bed about 10 PM Takes him over an hour to fall asleep Wakes up about 2-3 times during the night Final awakening time about 7 AM  Usually never feels restored with his sleep  He does have a history of ADHD, bipolar disorder-Not on any stimulating medications, concern for manic episodes  He gets very frustrated with not being able to sleep and on a few occasions has used multiple over-the-counter medications to help him get some sleep  He has started to exercise about 30 minutes a day but only walks slowlyas this is what he is able to tolerate, has managed to lose about 7 pounds  Past Medical History:  Diagnosis Date  . Anginal pain ([place])    ER visit 01/14/2014 visit on chart   . Anxiety    pt denies  . Bipolar disorder ([place])   . Chronic lower back pain   . Depression    did get seen in er 4/13 for evaluation-psyc situational none recent  . GERD (gastroesophageal reflux disease)   . Headache    none recent  . High cholesterol   .  High triglycerides   . Hypertension   . Levator syndrome 2001   history   . Neuropathy   . OSA on CPAP   . Panic attacks   . Perirectal abscess   . S/P ablation of atrial fibrillation   . Type II diabetes mellitus ([place])    Social History   Socioeconomic History  . Marital status: Widowed    Spouse name: Not on file  . Number of children: Not on file  . Years of education: Not on file  . Highest education level: Not on file  Occupational History  . Not on file  Tobacco Use  . Smoking status: Former Smoker    Packs/day: 0.50    Years: 1.00    Pack years: 0.50    Types: Cigarettes  . Smokeless tobacco: Never Used  . Tobacco comment: quit 1983  Substance and Sexual Activity  . Alcohol use: Not Currently    Comment: rare wine  . Drug use: Not Currently    Comment: not since 70'S  . Sexual activity: Not on file  Other Topics Concern  . Not on file  Social History Narrative  . Not on file   Social Determinants of Health   Financial Resource Strain:   . Difficulty of Paying Living Expenses:   Food Insecurity:   . Worried About Charity fundraiser in the  Last Year:   . [place] in the Last Year:   Transportation Needs:   . Film/video editor (Medical):   Marland Kitchen Lack of Transportation (Non-Medical):   Physical Activity:   . Days of Exercise per Week:   . Minutes of Exercise per Session:   Stress:   . Feeling of Stress :   Social Connections:   . Frequency of Communication with Friends and Family:   . Frequency of Social Gatherings with Friends and Family:   . Attends Religious Services:   . Active Member of Clubs or Organizations:   . Attends Archivist Meetings:   Marland Kitchen Marital Status:   Intimate Partner Violence:   . Fear of Current or Ex-Partner:   . Emotionally Abused:   Marland Kitchen Physically Abused:   . Sexually Abused:    Family History  Problem Relation Age of Onset  . Breast cancer Mother   . Ovarian cancer Mother     Review of Systems   Constitutional: Negative for fever and unexpected weight change.  HENT: Negative for congestion, dental problem, ear pain, nosebleeds, postnasal drip, rhinorrhea, sinus pressure, sneezing, sore throat and trouble swallowing.   Eyes: Negative for redness and itching.  Respiratory: Positive for chest tightness and shortness of breath. Negative for cough and wheezing.   Cardiovascular: Positive for palpitations. Negative for leg swelling.  Gastrointestinal: Negative for nausea and vomiting.  Genitourinary: Negative for dysuria.  Musculoskeletal: Negative for joint swelling.  Skin: Negative for rash.  Allergic/Immunologic: Negative.  Negative for environmental allergies, food allergies and immunocompromised state.  Neurological: Positive for dizziness. Negative for headaches.  Hematological: Does not bruise/bleed easily.  Psychiatric/Behavioral: Negative for dysphoric mood. The patient is nervous/anxious.        Objective:   Physical Exam Constitutional:      Appearance: He is obese.  HENT:     Head: Normocephalic.     Nose: Nose normal. No congestion.     Mouth/Throat:     Mouth: Mucous membranes are moist.  Eyes:     Extraocular Movements: Extraocular movements intact.     Pupils: Pupils are equal, round, and reactive to light.  Cardiovascular:     Rate and Rhythm: Normal rate and regular rhythm.     Pulses: Normal pulses.     Heart sounds: Normal heart sounds. No murmur. No friction rub.  Pulmonary:     Effort: Pulmonary effort is normal. No respiratory distress.     Breath sounds: Normal breath sounds. No stridor. No wheezing or rhonchi.  Musculoskeletal:     Cervical back: Normal range of motion and neck supple. No rigidity or tenderness.  Neurological:     Mental Status: He is alert.    Vitals:   03/19/20 1644  BP: 122/62  Pulse: 77  Temp: (!) 97.3 F (36.3 C)  SpO2: 97%   Results of the Epworth flowsheet 02/06/2020  Sitting and reading 0  Watching TV 0  Sitting,  inactive in a public place (e.g. a theatre or a meeting) 0  As a passenger in a car for an hour without a break 0  Lying down to rest in the afternoon when circumstances permit 0  Sitting and talking to someone 0  Sitting quietly after a lunch without alcohol 0  In a car, while stopped for a few minutes in traffic 3  Total score 3   Excellent compliance with CPAP on a percent compliance, CPAP settings of 13 Residual AHI of 5  Assessment & Plan:  Chronic insomnia  Underlying history of depression, bipolar disorder -Treatment optimized at present -Increase Belsomra to 20 mg  Obstructive sleep apnea for which he is on CPAP -AHI of 5 -Encourage CPAP compliance  Obesity -Has increased activity -Seen some weight loss  Recommendation Continue Belsomra, increased to 20 mg  Continue CPAP 13  I will see him back in the office in about 3 months  The risk of doubling up on his medications discussed, risk of overdosing on medications discussed  Encouraged to stay active  Encouraged to exercise regularly

## 2020-03-21 ENCOUNTER — Telehealth: Payer: Self-pay | Admitting: Cardiology

## 2020-03-21 MED ORDER — OLMESARTAN MEDOXOMIL 40 MG PO TABS: 20.0000 mg | ORAL_TABLET | Freq: Every day | ORAL | 2 refills | Status: DC

## 2020-03-21 NOTE — Telephone Encounter (Signed)
Patient states that he has been having dizzy spells recently. He becomes very dizzy when he stands up quickly. He has not passed out but has felt like he has come close. He states he does have some slight shortness of breath but denies any chest pain. He does not take his BP at home but reports that it was 122/62 at his pulmonologist on 03/23.  Patient was advised to stay hydrated - states that he drinks water at the gym but other than that drinks diet coke.  Also advised patient to change positions slowly. Patient was scheduled for an appointment with Dr. [name] on 04/07 but would like a sooner appointment if something opens up.

## 2020-03-21 NOTE — Telephone Encounter (Signed)
I spoke with pt. His med list has losartan and Olmesartan on it. Pt reports he is not taking losartan. He will decrease olmesartan to 20 mg daily and call with update next week. He is aware to stay hydrated and go to ED if symptoms worsen.

## 2020-03-21 NOTE — Telephone Encounter (Signed)
Is taking an ARB blood pressure medicine.  Please tell him to cut it into half and keep himself well-hydrated and let us know early next week how he feels.  If he feels worse he needs to go to the emergency room.

## 2020-03-21 NOTE — Telephone Encounter (Signed)
Follow up  Pt returning call   Please call 

## 2020-03-21 NOTE — Telephone Encounter (Signed)
STAT if patient feels like he/she is going to faint   1) Are you dizzy now? Not at this time  2) Do you feel faint or have you passed out? * when it happen, he almost pass out**  3) Do you have any other symptoms? A little shortness of breath  4) Have you checked your HR and BP (record if available)? These are good-pt wants an appt, he says it is getting worse- first available appt is 04-03-20 with Dr Lennox Pippins

## 2020-03-21 NOTE — Telephone Encounter (Signed)
Message left for pt to call back.

## 2020-03-25 ENCOUNTER — Telehealth: Payer: Self-pay | Admitting: Pulmonary Disease

## 2020-03-25 ENCOUNTER — Other Ambulatory Visit: Payer: Self-pay | Admitting: Pulmonary Disease

## 2020-03-25 MED ORDER — BELSOMRA 20 MG PO TABS: 20.0000 mg | ORAL_TABLET | Freq: Every day | ORAL | 1 refills | Status: DC

## 2020-03-25 NOTE — Telephone Encounter (Signed)
Looking at last office note Belsomra was increased to 20mg . New script pending for Dr. Ander Slade to sign and send to pharmacy.

## 2020-04-01 NOTE — Telephone Encounter (Signed)
Script for Belsomra 20mg  was sent to preferred pharmacy on 03/25/20 by Dr. Ander Slade. Will sign off.

## 2020-04-03 ENCOUNTER — Other Ambulatory Visit: Payer: Self-pay

## 2020-04-03 ENCOUNTER — Ambulatory Visit (INDEPENDENT_AMBULATORY_CARE_PROVIDER_SITE_OTHER): Payer: No Typology Code available for payment source | Admitting: Cardiology

## 2020-04-03 ENCOUNTER — Encounter: Payer: Self-pay | Admitting: Cardiology

## 2020-04-03 VITALS — BP 112/66 | HR 80 | Ht 72.0 in | Wt 299.0 lb

## 2020-04-03 DIAGNOSIS — I480 Paroxysmal atrial fibrillation: Secondary | ICD-10-CM

## 2020-04-03 DIAGNOSIS — Z9889 Other specified postprocedural states: Secondary | ICD-10-CM

## 2020-04-03 DIAGNOSIS — I10 Essential (primary) hypertension: Secondary | ICD-10-CM

## 2020-04-03 DIAGNOSIS — Z8679 Personal history of other diseases of the circulatory system: Secondary | ICD-10-CM

## 2020-04-03 DIAGNOSIS — I2510 Atherosclerotic heart disease of native coronary artery without angina pectoris: Secondary | ICD-10-CM

## 2020-04-03 DIAGNOSIS — Z79899 Other long term (current) drug therapy: Secondary | ICD-10-CM

## 2020-04-03 DIAGNOSIS — E1149 Type 2 diabetes mellitus with other diabetic neurological complication: Secondary | ICD-10-CM

## 2020-04-03 MED ORDER — ATORVASTATIN CALCIUM 10 MG PO TABS: 10.0000 mg | ORAL_TABLET | Freq: Every day | ORAL | 3 refills | Status: DC

## 2020-04-03 NOTE — Patient Instructions (Signed)
Medication Instructions:  Your physician has recommended you make the following change in your medication:   Start Atorvastatin 10 mg daily.   *If you need a refill on your cardiac medications before your next appointment, please call your pharmacy*   Lab Work: Your physician recommends that you return for lab work in: 6 weeks (05/15/2020) for repeat lipid and liver check. You had a BMET and LFT;s done today.  If you have labs (blood work) drawn today and your tests are completely normal, you will receive your results only by: Marland Kitchen MyChart Message (if you have MyChart) OR . A paper copy in the mail If you have any lab test that is abnormal or we need to change your treatment, we will call you to review the results.   Testing/Procedures: None ordered   Follow-Up: At [facility], you and your health needs are our priority.  As part of our continuing mission to provide you with exceptional heart care, we have created designated Provider Care Teams.  These Care Teams include your primary Cardiologist (physician) and Advanced Practice Providers (APPs -  Physician Assistants and Nurse Practitioners) who all work together to provide you with the care you need, when you need it.  We recommend signing up for the patient portal called "MyChart".  Sign up information is provided on this After Visit Summary.  MyChart is used to connect with patients for Virtual Visits (Telemedicine).  Patients are able to view lab/test results, encounter notes, upcoming appointments, etc.  Non-urgent messages can be sent to your provider as well.   To learn more about what you can do with MyChart, go to [email or web address].    Your next appointment:   6 month(s)  The format for your next appointment:   In Person  Provider:   Jyl Heinz, MD   Other Instructions NA

## 2020-04-03 NOTE — Progress Notes (Signed)
Cardiology Office Note:    Date:  04/03/2020   ID:  Gary Grimes, DOB 08-01-1961, MRN [id number]  PCP:  Shirline Frees, MD  Cardiologist:  Jenean Lindau, MD   Referring MD: Shirline Frees, MD    ASSESSMENT:    1. Essential hypertension   2. Paroxysmal atrial fibrillation (HCC)   3. Mild CAD   4. Type II diabetes mellitus with neurological manifestations ([place])   5. S/P ablation of atrial fibrillation    PLAN:    In order of problems listed above:  1. Coronary artery disease: Secondary prevention stressed with the patient.  Importance of compliance with diet medication stressed and he vocalized understanding.  He exercises on a regular basis and I am happy with his effort tolerance. 2. Essential hypertension: Blood pressure is stable 3. Paroxysmal atrial fibrillation post ablation:I discussed with the patient atrial fibrillation, disease process. Management and therapy including rate and rhythm control, anticoagulation benefits and potential risks were discussed extensively with the patient. Patient had multiple questions which were answered to patient's satisfaction. 4. Diabetes mellitus and mixed dyslipidemia: In view of this I want him to be on statin therapy and therefore will have a Chem-7 and liver panel today.  His LDL is fine but he will benefit from statins.  He will have a Chem-7 and liver panel and will have initiated atorvastatin 10 mg daily and liver lipid check in 6 weeks. 5. Patient will be seen in follow-up appointment in 6 months or earlier if the patient has any concerns    Medication Adjustments/Labs and Tests Ordered: Current medicines are reviewed at length with the patient today.  Concerns regarding medicines are outlined above.  No orders of the defined types were placed in this encounter.  No orders of the defined types were placed in this encounter.    No chief complaint on file.    History of Present Illness:    Gary Grimes is a 59 y.o.  male.  Patient has past medical history approximately fibrillation post ablation, essential hypertension, nonobstructive coronary artery disease diabetes mellitus and obesity.  He exercises on a regular basis.  He denies any chest pain orthopnea or PND.  At the time of my evaluation, the patient is alert awake oriented and in no distress.  Past Medical History:  Diagnosis Date  . Anginal pain ([place])    ER visit 01/14/2014 visit on chart   . Anxiety    pt denies  . Bipolar disorder ([place])   . Chronic lower back pain   . Depression    did get seen in er 4/13 for evaluation-psyc situational none recent  . GERD (gastroesophageal reflux disease)   . Headache    none recent  . High cholesterol   . High triglycerides   . Hypertension   . Levator syndrome 2001   history   . Neuropathy   . OSA on CPAP   . Panic attacks   . Perirectal abscess   . S/P ablation of atrial fibrillation   . Type II diabetes mellitus ([place])     Past Surgical History:  Procedure Laterality Date  . ANAL FISSURE REPAIR  08/05/2000   proctoscopy  . APPENDECTOMY  1984  . ATRIAL FIBRILLATION ABLATION  10/28/2018  . ATRIAL FIBRILLATION ABLATION N/A 10/28/2018   Procedure: ATRIAL FIBRILLATION ABLATION;  Surgeon: Constance Haw, MD;  Location: [facility];  Service: Cardiovascular;  Laterality: N/A;  . BIOPSY  05/24/2019   Procedure: BIOPSY;  Surgeon: Ronald Lobo, MD;  Location: [facility];  Service: Endoscopy;;  . BIOPSY  08/10/2019   Procedure: BIOPSY;  Surgeon: Ronald Lobo, MD;  Location: [facility];  Service: Endoscopy;;  . COLONOSCOPY  2011  . COLONOSCOPY WITH PROPOFOL N/A 08/10/2019   Procedure: COLONOSCOPY WITH PROPOFOL;  Surgeon: Ronald Lobo, MD;  Location: [facility];  Service: Endoscopy;  Laterality: N/A;  . ESOPHAGOGASTRODUODENOSCOPY (EGD) WITH PROPOFOL N/A 05/24/2019   Procedure: ESOPHAGOGASTRODUODENOSCOPY (EGD) WITH PROPOFOL;  Surgeon: Ronald Lobo, MD;  Location: [facility];   Service: Endoscopy;  Laterality: N/A;  . GASTROCNEMIUS RECESSION Left 11/03/2019   Procedure: LEFT GASTROCNEMIUS RECESSION;  Surgeon: Newt Minion, MD;  Location: [name];  Service: Orthopedics;  Laterality: Left;  . HERNIA REPAIR    . INSERTION OF MESH N/A 01/29/2015   Procedure: INSERTION OF MESH;  Surgeon: Excell Seltzer, MD;  Location: [facility];  Service: General;  Laterality: N/A;  . IRRIGATION AND DEBRIDEMENT ABSCESS  02/18/2012   peri-rectal  . LEFT HEART CATH AND CORONARY ANGIOGRAPHY N/A 06/08/2018   Procedure: LEFT HEART CATH AND CORONARY ANGIOGRAPHY;  Surgeon: Leonie Man, MD;  Location: [facility];  Service: Cardiovascular;  Laterality: N/A;  . NASAL SEPTOPLASTY W/ TURBINOPLASTY  05/31/2019  . NASAL SEPTOPLASTY W/ TURBINOPLASTY Bilateral 05/31/2019   Procedure: NASAL SEPTOPLASTY WITH BILATERAL TURBINATE REDUCTION;  Surgeon: Leta Baptist, MD;  Location: [place] [facility];  Service: ENT;  Laterality: Bilateral;  . PLANTAR FASCIA RELEASE Left 11/03/2019   Procedure: PLANTAR FASCIA RELEASE LEFT FOOT;  Surgeon: Newt Minion, MD;  Location: [facility];  Service: Orthopedics;  Laterality: Left;  . POLYPECTOMY  08/10/2019   Procedure: POLYPECTOMY;  Surgeon: Ronald Lobo, MD;  Location: [facility];  Service: Endoscopy;;  . SHOULDER ARTHROSCOPY Left ?2009   "repaired  AC joint; reattached bicept tendon"  . SHOULDER ARTHROSCOPY W/ LABRAL REPAIR Left 08/08/2007  . UMBILICAL HERNIA REPAIR  10/27/2010  . VENTRAL HERNIA REPAIR N/A 01/29/2015   Procedure: LAPAROSCOPIC VENTRAL HERNIA;  Surgeon: Excell Seltzer, MD;  Location: [facility];  Service: General;  Laterality: N/A;    Current Medications: No outpatient medications have been marked as taking for the 04/03/20 encounter (Office Visit) with Loys Shugars, Reita Cliche, MD.     Allergies:   Morphine   Social History   Socioeconomic History  . Marital status: Widowed    Spouse name: Not on file  . Number of children: Not on file  . Years of education: Not  on file  . Highest education level: Not on file  Occupational History  . Not on file  Tobacco Use  . Smoking status: Former Smoker    Packs/day: 0.50    Years: 1.00    Pack years: 0.50    Types: Cigarettes  . Smokeless tobacco: Never Used  . Tobacco comment: quit 1983  Substance and Sexual Activity  . Alcohol use: Not Currently    Comment: rare wine  . Drug use: Not Currently    Comment: not since 70'S  . Sexual activity: Not on file  Other Topics Concern  . Not on file  Social History Narrative  . Not on file   Social Determinants of Health   Financial Resource Strain:   . Difficulty of Paying Living Expenses:   Food Insecurity:   . Worried About Charity fundraiser in the Last Year:   . Arboriculturist in the Last Year:   Transportation Needs:   . Film/video editor (Medical):   Marland Kitchen  Lack of Transportation (Non-Medical):   Physical Activity:   . Days of Exercise per Week:   . Minutes of Exercise per Session:   Stress:   . Feeling of Stress :   Social Connections:   . Frequency of Communication with Friends and Family:   . Frequency of Social Gatherings with Friends and Family:   . Attends Religious Services:   . Active Member of Clubs or Organizations:   . Attends Archivist Meetings:   Marland Kitchen Marital Status:      Family History: The patient's family history includes Breast cancer in his mother; Ovarian cancer in his mother.  ROS:   Please see the history of present illness.    All other systems reviewed and are negative.  EKGs/Labs/Other Studies Reviewed:    The following studies were reviewed today: LEFT HEART CATH AND CORONARY ANGIOGRAPHY  Conclusion    Minimal - non-obstructive CAD  LV end diastolic pressure is moderately elevated.  LVEF ~ 55-65% - Normal with no Regional Wall Motion Abnormalities   Essentially normal cardiac catheterization.  Plan: Return to short stay for TR removal and discharge home after bedrest.      Follow-up with Dr. [name] as scheduled.    Glenetta Hew, M.D., M.S. Interventional Cardiologist       Recent Labs: 05/10/2019: ALT 32 11/03/2019: Hemoglobin 13.5; Platelets 245 01/01/2020: BUN 17; Creatinine, Ser 1.21; Potassium 4.2; Sodium 142  Recent Lipid Panel No results found for: CHOL, TRIG, HDL, CHOLHDL, VLDL, LDLCALC, LDLDIRECT  Physical Exam:    VS:  There were no vitals taken for this visit.    Wt Readings from Last 3 Encounters:  03/19/20 298 lb 9.6 oz (135.4 kg)  02/06/20 (!) 306 lb 6.4 oz (139 kg)  12/28/19 (!) 313 lb (142 kg)     GEN: Patient is in no acute distress HEENT: Normal NECK: No JVD; No carotid bruits LYMPHATICS: No lymphadenopathy CARDIAC: Hear sounds regular, 2/6 systolic murmur at the apex. RESPIRATORY:  Clear to auscultation without rales, wheezing or rhonchi  ABDOMEN: Soft, non-tender, non-distended MUSCULOSKELETAL:  No edema; No deformity  SKIN: Warm and dry NEUROLOGIC:  Alert and oriented x 3 PSYCHIATRIC:  Normal affect   Signed, Jenean Lindau, MD  04/03/2020 11:27 AM    [place]

## 2020-04-04 LAB — HEPATIC FUNCTION PANEL
ALT: 24 IU/L (ref 0–44)
AST: 24 IU/L (ref 0–40)
Albumin: 4.5 g/dL (ref 3.8–4.8)
Alkaline Phosphatase: 82 IU/L (ref 39–117)
Bilirubin Total: 0.4 mg/dL (ref 0.0–1.2)
Bilirubin, Direct: 0.12 mg/dL (ref 0.00–0.40)
Total Protein: 6.9 g/dL (ref 6.0–8.5)

## 2020-04-04 LAB — BASIC METABOLIC PANEL
BUN/Creatinine Ratio: 11 (ref 10–24)
BUN: 15 mg/dL (ref 8–27)
CO2: 24 mmol/L (ref 20–29)
Calcium: 9.6 mg/dL (ref 8.6–10.2)
Chloride: 103 mmol/L (ref 96–106)
Creatinine, Ser: 1.36 mg/dL — ABNORMAL HIGH (ref 0.76–1.27)
GFR calc Af Amer: 64 mL/min/{1.73_m2} (ref >59)
GFR calc non Af Amer: 56 mL/min/{1.73_m2} — ABNORMAL LOW (ref >59)
Glucose: 141 mg/dL — ABNORMAL HIGH (ref 65–99)
Potassium: 4.3 mmol/L (ref 3.5–5.2)
Sodium: 138 mmol/L (ref 134–144)

## 2020-04-05 ENCOUNTER — Telehealth: Payer: Self-pay | Admitting: Cardiology

## 2020-04-05 NOTE — Telephone Encounter (Signed)
New message   Patient is returning call for test results. Please call.

## 2020-04-11 ENCOUNTER — Other Ambulatory Visit: Payer: Self-pay | Admitting: Podiatry

## 2020-04-11 ENCOUNTER — Telehealth: Payer: Self-pay | Admitting: Podiatry

## 2020-04-11 MED ORDER — PREGABALIN 150 MG PO CAPS: 150.0000 mg | ORAL_CAPSULE | Freq: Two times a day (BID) | ORAL | 2 refills | Status: DC

## 2020-04-11 NOTE — Addendum Note (Signed)
Addended by: Harriett Sine D on: 04/11/2020 03:57 PM   Modules accepted: Orders

## 2020-04-11 NOTE — Telephone Encounter (Signed)
Left message [id number] with lyrica orders. Left message informing pt of lyrica orders.

## 2020-04-11 NOTE — Telephone Encounter (Signed)
Patient called and needs a refill on medication(pregabalin) he is completely out of the medication. He said he needs it called in ASAP.

## 2020-04-18 ENCOUNTER — Ambulatory Visit: Payer: PRIVATE HEALTH INSURANCE | Admitting: Podiatry

## 2020-05-07 ENCOUNTER — Ambulatory Visit (INDEPENDENT_AMBULATORY_CARE_PROVIDER_SITE_OTHER): Payer: PRIVATE HEALTH INSURANCE | Admitting: Podiatry

## 2020-05-07 ENCOUNTER — Other Ambulatory Visit: Payer: Self-pay

## 2020-05-07 DIAGNOSIS — M722 Plantar fascial fibromatosis: Secondary | ICD-10-CM | POA: Diagnosis not present

## 2020-05-07 DIAGNOSIS — E1149 Type 2 diabetes mellitus with other diabetic neurological complication: Secondary | ICD-10-CM | POA: Diagnosis not present

## 2020-05-07 NOTE — Patient Instructions (Signed)

## 2020-05-12 NOTE — Progress Notes (Signed)
Subjective: 59 year old male presents the office today for follow-up evaluation of bilateral heel pain, plantar fasciitis of the right side worse than left.  Injections helped previously.  Due to other issues he is not able to proceed with surgery.  He presents today asking for injections.  He states it feels that it did previously.  Also has neuropathy on Lyrica.  Denies any systemic complaints such as fevers, chills, nausea, vomiting. No acute changes since last appointment, and no other complaints at this time.   Objective: AAO x3, NAD DP/PT pulses palpable bilaterally, CRT less than 3 seconds Tenderness to palpation along the plantar medial tubercle of the calcaneus at the insertion of plantar fascia on the right > left foot. There is no pain along the course of the plantar fascia within the arch of the foot. Plantar fascia appears to be intact. There is no pain with lateral compression of the calcaneus or pain with vibratory sensation. There is no pain along the course or insertion of the achilles tendon. No other areas of tenderness to bilateral lower extremities. No pain with calf compression, swelling, warmth, erythema  Assessment: Bilateral plantar fasciitis  Plan: -All treatment options discussed with the patient including all alternatives, risks, complications.  -Steroid injection performed.  See procedure note below.  Continue stretching, icing daily.  Continue to modifications and orthotics which we discussed again. -Continue current neuropathy treatments. -Patient encouraged to call the office with any questions, concerns, change in symptoms.   Procedure: Injection Tendon/Ligament Discussed alternatives, risks, complications and verbal consent was obtained.  Location: Bilateral plantar fascia at the glabrous junction; medial approach. Skin Prep: Alcohol  Injectate: 0.5cc 0.5% marcaine plain, 0.5 cc 2% lidocaine plain and, 1 cc kenalog 10. Disposition: Patient tolerated procedure  well. Injection site dressed with a band-aid.  Post-injection care was discussed and return precautions discussed.   Return in about 6 weeks (around 06/18/2020).  Trula Slade DPM

## 2020-05-21 ENCOUNTER — Other Ambulatory Visit: Payer: Self-pay | Admitting: Pulmonary Disease

## 2020-05-21 ENCOUNTER — Telehealth: Payer: Self-pay | Admitting: Pulmonary Disease

## 2020-05-21 NOTE — Telephone Encounter (Signed)
Spoke with pt, having really weird dreams and feeling like he not getting enough sleep. He wants to know if there is something else he can try? He has tried [place] and Ambien in the past.

## 2020-05-21 NOTE — Telephone Encounter (Signed)
Left message for patient to call back  

## 2020-05-21 NOTE — Telephone Encounter (Signed)
Yes, that's the maximum belsomnra

## 2020-05-21 NOTE — Telephone Encounter (Signed)
Spoke with patient. He stated that he needed a refill on his Belsomra. He is currently taking 20mg  each night but is still having issues with sleeping. He stated that out of the past 10 days, he has had 3 days where he did not sleep at all. He wants to know if the dosage can be increased. I advised him that the 20mg  dosage was the maximum but I would still check with AO. He verbalized understanding.   [facility] [place] on [address].   AO, please advise. Thanks!

## 2020-05-21 NOTE — Telephone Encounter (Signed)
We can definitely circle back to [place] or Lun[place]  Yes, Belsomra can cause abnormal dreams  Not a lot of other options that he has not tried in the past

## 2020-05-22 NOTE — Telephone Encounter (Signed)
Spoke with patient regarding prior message. Advised patient that Dr.Olalere stated he can try again [place] or [place]. Patient stated he would just like to stay on the [place] advised patient that he is on the max dosage and that medication could also cause abnormal dreams. Patient's voice was understanding.Nothing else further needed.

## 2020-06-06 ENCOUNTER — Other Ambulatory Visit: Payer: Self-pay | Admitting: Cardiology

## 2020-06-06 NOTE — Telephone Encounter (Signed)
Dx A.Fib 59yo Male Wt 135.6kg Scr = 1.36 Dr [name] 04/03/2020

## 2020-06-09 ENCOUNTER — Emergency Department (HOSPITAL_COMMUNITY): Payer: No Typology Code available for payment source

## 2020-06-09 ENCOUNTER — Other Ambulatory Visit: Payer: Self-pay

## 2020-06-09 ENCOUNTER — Emergency Department (HOSPITAL_COMMUNITY)
Admission: EM | Admit: 2020-06-09 | Discharge: 2020-06-09 | Disposition: A | Discharge status: Home / Self Care | Payer: No Typology Code available for payment source | Attending: Emergency Medicine | Admitting: Emergency Medicine

## 2020-06-09 ENCOUNTER — Encounter (HOSPITAL_COMMUNITY): Payer: Self-pay | Admitting: Emergency Medicine

## 2020-06-09 DIAGNOSIS — I10 Essential (primary) hypertension: Secondary | ICD-10-CM | POA: Insufficient documentation

## 2020-06-09 DIAGNOSIS — E119 Type 2 diabetes mellitus without complications: Secondary | ICD-10-CM | POA: Diagnosis not present

## 2020-06-09 DIAGNOSIS — M542 Cervicalgia: Secondary | ICD-10-CM | POA: Diagnosis present

## 2020-06-09 DIAGNOSIS — G8929 Other chronic pain: Secondary | ICD-10-CM | POA: Diagnosis not present

## 2020-06-09 DIAGNOSIS — Z79899 Other long term (current) drug therapy: Secondary | ICD-10-CM | POA: Diagnosis not present

## 2020-06-09 DIAGNOSIS — M545 Low back pain: Secondary | ICD-10-CM | POA: Diagnosis not present

## 2020-06-09 DIAGNOSIS — M5410 Radiculopathy, site unspecified: Secondary | ICD-10-CM

## 2020-06-09 DIAGNOSIS — Z87891 Personal history of nicotine dependence: Secondary | ICD-10-CM | POA: Diagnosis not present

## 2020-06-09 DIAGNOSIS — Z7901 Long term (current) use of anticoagulants: Secondary | ICD-10-CM | POA: Diagnosis not present

## 2020-06-09 MED ORDER — DEXAMETHASONE SODIUM PHOSPHATE 10 MG/ML IJ SOLN
10.0000 mg | Freq: Once | INTRAMUSCULAR | Status: AC
  Administered 2020-06-09: 10 mg via INTRAMUSCULAR
  Filled 2020-06-09: qty 1

## 2020-06-09 MED ORDER — DIAZEPAM 2 MG PO TABS
2.0000 mg | ORAL_TABLET | Freq: Once | ORAL | Status: AC
  Administered 2020-06-09: 2 mg via ORAL
  Filled 2020-06-09: qty 1

## 2020-06-09 MED ORDER — METHOCARBAMOL 500 MG PO TABS
500.0000 mg | ORAL_TABLET | Freq: Two times a day (BID) | ORAL | 0 refills | Status: DC
Start: 2020-06-09 — End: 2020-08-06

## 2020-06-09 MED ORDER — IBUPROFEN 600 MG PO TABS
600.0000 mg | ORAL_TABLET | Freq: Four times a day (QID) | ORAL | 0 refills | Status: DC | PRN
Start: 2020-06-09 — End: 2020-08-29

## 2020-06-09 MED ORDER — HYDROMORPHONE HCL 2 MG/ML IJ SOLN
2.0000 mg | Freq: Once | INTRAMUSCULAR | Status: AC
  Administered 2020-06-09: 2 mg via INTRAMUSCULAR
  Filled 2020-06-09 (×2): qty 1

## 2020-06-09 MED ORDER — NAPROXEN 500 MG PO TABS
500.0000 mg | ORAL_TABLET | Freq: Once | ORAL | Status: AC
  Administered 2020-06-09: 500 mg via ORAL
  Filled 2020-06-09: qty 1

## 2020-06-09 MED ORDER — OXYCODONE-ACETAMINOPHEN 5-325 MG PO TABS: 2.0000 | ORAL_TABLET | ORAL | 0 refills | Status: DC | PRN

## 2020-06-09 NOTE — Discharge Instructions (Signed)
CT scan is reassuring.  We recommend that you follow-up with the neurosurgery for further evaluation.  Ice and heat therapy over the neck should also be helpful along with the pain medications prescribed.  Return to the ER if your symptoms get worse or you start developing weakness, numbness, urinary incontinence or retention.

## 2020-06-09 NOTE — ED Triage Notes (Signed)
Pt c/o neck pain that flared up yesterday. Hx degenerative disc dx in c5. Reports radiating down to shoulder blades. Has on brace form past.

## 2020-06-11 NOTE — ED Provider Notes (Signed)
[facility] DEPT Provider Note   CSN: [id number] Arrival date & time: 06/09/20  3474     History Chief Complaint  Patient presents with  . Neck Pain    Gary Grimes is a 59 y.o. male.  HPI    59 year old male comes in a chief complaint of neck pain. Patient has chronic back pain, hypertension, hyperlipidemia. He reports that he started having neck pain few days back but it really flared up yesterday when he was turning his head. Suddenly he started having severe pain. Pain is located over his neck and it moves down his spine and down his lower extremities. He has no associated weakness. The pain also moves towards the shoulder blades but does not go down the upper extremities. He has no new tingling. Patient denies any urinary incontinence, urinary retention, saddle anesthesia. He has history of degenerative spine disease of the neck.   Past Medical History:  Diagnosis Date  . Anginal pain ([place])    ER visit 01/14/2014 visit on chart   . Anxiety    pt denies  . Bipolar disorder ([place])   . Chronic lower back pain   . Depression    did get seen in er 4/13 for evaluation-psyc situational none recent  . GERD (gastroesophageal reflux disease)   . Headache    none recent  . High cholesterol   . High triglycerides   . Hypertension   . Levator syndrome 2001   history   . Neuropathy   . OSA on CPAP   . Panic attacks   . Perirectal abscess   . S/P ablation of atrial fibrillation   . Type II diabetes mellitus [facility])     Patient Active Problem List   Diagnosis Date Noted  . DOE (dyspnea on exertion) 11/29/2019  . Achilles tendon contracture, left   . Acquired equinus deformity of both feet 10/22/2019  . S/P nasal septoplasty 05/31/2019  . Preoperative cardiovascular examination 05/17/2019  . Mild CAD 05/17/2019  . Chronic pansinusitis 04/28/2019  . Plantar fasciitis of left foot 02/28/2019  . Type II diabetes mellitus with neurological  manifestations ([place]) 02/28/2019  . S/P ablation of atrial fibrillation   . Paroxysmal atrial fibrillation ([place]) 06/06/2018  . Essential hypertension 06/06/2018  . Diabetes mellitus due to underlying condition with unspecified complications ([place]) 25/95/6387  . Sleep apnea 06/06/2018  . Overweight 06/06/2018  . Bipolar disorder, manic ([place]) 05/29/2016  . 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 Ventral incisional hernia 01/29/2015  . Perirectal abscess 02/18/2012  . SPONDYLOSIS 09/19/2008    Past Surgical History:  Procedure Laterality Date  . ANAL FISSURE REPAIR  08/05/2000   proctoscopy  . APPENDECTOMY  1984  . ATRIAL FIBRILLATION ABLATION  10/28/2018  . ATRIAL FIBRILLATION ABLATION N/A 10/28/2018   Procedure: ATRIAL FIBRILLATION ABLATION;  Surgeon: Constance Haw, MD;  Location: [facility] CV LAB;  Service: Cardiovascular;  Laterality: N/A;  . BIOPSY  05/24/2019   Procedure: BIOPSY;  Surgeon: Ronald Lobo, MD;  Location: [facility];  Service: Endoscopy;;  . BIOPSY  08/10/2019   Procedure: BIOPSY;  Surgeon: Ronald Lobo, MD;  Location: [facility];  Service: Endoscopy;;  . COLONOSCOPY  2011  . COLONOSCOPY WITH PROPOFOL N/A 08/10/2019   Procedure: COLONOSCOPY WITH PROPOFOL;  Surgeon: Ronald Lobo, MD;  Location: [facility];  Service: Endoscopy;  Laterality: N/A;  . ESOPHAGOGASTRODUODENOSCOPY (EGD) WITH PROPOFOL N/A 05/24/2019   Procedure: ESOPHAGOGASTRODUODENOSCOPY (EGD) WITH PROPOFOL;  Surgeon:  Ronald Lobo, MD;  Location: [facility];  Service: Endoscopy;  Laterality: N/A;  . GASTROCNEMIUS RECESSION Left 11/03/2019   Procedure: LEFT GASTROCNEMIUS RECESSION;  Surgeon: Newt Minion, MD;  Location: [facility];  Service: Orthopedics;  Laterality: Left;  . HERNIA REPAIR    . INSERTION OF MESH N/A 01/29/2015   Procedure: INSERTION OF MESH;  Surgeon: Excell Seltzer, MD;  Location: [facility];  Service: General;  Laterality: N/A;   . IRRIGATION AND DEBRIDEMENT ABSCESS  02/18/2012   peri-rectal  . LEFT HEART CATH AND CORONARY ANGIOGRAPHY N/A 06/08/2018   Procedure: LEFT HEART CATH AND CORONARY ANGIOGRAPHY;  Surgeon: Leonie Man, MD;  Location: [facility] LAB;  Service: Cardiovascular;  Laterality: N/A;  . NASAL SEPTOPLASTY W/ TURBINOPLASTY  05/31/2019  . NASAL SEPTOPLASTY W/ TURBINOPLASTY Bilateral 05/31/2019   Procedure: NASAL SEPTOPLASTY WITH BILATERAL TURBINATE REDUCTION;  Surgeon: Leta Baptist, MD;  Location: [facility];  Service: ENT;  Laterality: Bilateral;  . PLANTAR FASCIA RELEASE Left 11/03/2019   Procedure: PLANTAR FASCIA RELEASE LEFT FOOT;  Surgeon: Newt Minion, MD;  Location: [facility];  Service: Orthopedics;  Laterality: Left;  . POLYPECTOMY  08/10/2019   Procedure: POLYPECTOMY;  Surgeon: Ronald Lobo, MD;  [facility];  Service: Endoscopy;;  . SHOULDER ARTHROSCOPY Left ?2009   "repaired  AC joint; reattached bicept tendon"  . SHOULDER ARTHROSCOPY W/ LABRAL REPAIR Left 08/08/2007  . UMBILICAL HERNIA REPAIR  10/27/2010  . VENTRAL HERNIA REPAIR N/A 01/29/2015   Procedure: LAPAROSCOPIC VENTRAL HERNIA;  Surgeon: Excell Seltzer, MD;  Location: [facility];  Service: General;  Laterality: N/A;       Family History  Problem Relation Age of Onset  . Breast cancer Mother   . Ovarian cancer Mother     Social History   Tobacco Use  . Smoking status: Former Smoker    Packs/day: 0.50    Years: 1.00    Pack years: 0.50    Types: Cigarettes  . Smokeless tobacco: Never Used  . Tobacco comment: quit 1983  Vaping Use  . Vaping Use: Never used  Substance Use Topics  . Alcohol use: Not Currently    Comment: rare wine  . Drug use: Not Currently    Comment: not since 70'S    Home Medications Prior to Admission medications   Medication Sig Start Date End Date Taking? Authorizing Provider  albuterol (VENTOLIN HFA) 108 (90 Base) MCG/ACT inhaler Inhale 2 puffs into the lungs every 6 (six) hours as needed  for wheezing or shortness of breath.  10/26/19  Yes [provider]  Ascorbic Acid (VITAMIN C) 1000 MG tablet Take 4,000 mg by mouth daily.    Yes [provider]  atorvastatin (LIPITOR) 10 MG tablet Take 1 tablet (10 mg total) by mouth daily. 04/03/20 07/02/20 Yes Revankar, Reita Cliche, MD  BELSOMRA 20 MG TABS TAKE 20 MG BY MOUTH AT BEDTIME. 05/21/20  Yes Olalere, Adewale A, MD  ELIQUIS 5 MG TABS tablet TAKE 1 TABLET BY MOUTH TWICE A DAY Patient taking differently: Take 5 mg by mouth 2 (two) times daily.  06/06/20  Yes Revankar, Reita Cliche, MD  hydrOXYzine (ATARAX/VISTARIL) 25 MG tablet Take 25 mg by mouth at bedtime.   Yes [provider]  nitroGLYCERIN (NITROSTAT) 0.4 MG SL tablet Place 1 tablet (0.4 mg total) under the tongue every 5 (five) minutes as needed for up to 25 days for chest pain. 01/26/20 06/09/20 Yes Revankar, Reita Cliche, MD  NOVOLIN 70/30 (70-30) 100 UNIT/ML  injection Inject 36 Units into the skin 2 (two) times daily with a meal. Per sliding scale 10/02/19  Yes [provider]  olmesartan (BENICAR) 40 MG tablet Take 0.5 tablets (20 mg total) by mouth daily. Patient taking differently: Take 40 mg by mouth daily.  03/21/20  Yes Revankar, Reita Cliche, MD  omeprazole (PRILOSEC) 40 MG capsule Take 40 mg by mouth as needed (heartburn).  12/29/16  Yes [provider]  OZEMPIC, 1 MG/DOSE, 2 MG/1.5ML SOPN Inject 1 mg into the skin once a week. 05/08/20  Yes [provider]  phentermine (ADIPEX-P) 37.5 MG tablet Take 37.5 mg by mouth daily. 05/04/20  Yes [provider]  pioglitazone (ACTOS) 30 MG tablet Take 30 mg by mouth every morning.  06/07/18  Yes Revankar, Reita Cliche, MD  pregabalin (LYRICA) 150 MG capsule Take 1 capsule (150 mg total) by mouth 2 (two) times daily. 04/11/20  Yes Trula Slade, DPM  traZODone (DESYREL) 100 MG tablet Take 1 tablet (100 mg total) by mouth at bedtime as needed for sleep. Patient taking differently: Take 100 mg by mouth at  bedtime.  01/08/16  Yes Kerrie Buffalo, NP  B-D INS SYR ULTRAFINE 1CC/30G 30G X 1/2" 1 ML MISC AS DIRECTED TO USE TO DELIVER HUMALOG AS NEEDED PER SLIDING SCALE SUBCUTANOUS . 100 DAYS 07/16/19   [provider]  ibuprofen (ADVIL) 600 MG tablet Take 1 tablet (600 mg total) by mouth every 6 (six) hours as needed. 06/09/20   Varney Biles, MD  methocarbamol (ROBAXIN) 500 MG tablet Take 1 tablet (500 mg total) by mouth 2 (two) times daily. 06/09/20   Varney Biles, MD  oxyCODONE-acetaminophen (PERCOCET/ROXICET) 5-325 MG tablet Take 2 tablets by mouth every 4 (four) hours as needed for severe pain. 06/09/20   Varney Biles, MD    Allergies    Morphine  Review of Systems   Review of Systems  Constitutional: Positive for activity change.  Musculoskeletal: Positive for back pain and neck pain.  Allergic/Immunologic: Negative for immunocompromised state.  Neurological: Negative for weakness and numbness.  Hematological: Does not bruise/bleed easily.  All other systems reviewed and are negative.   Physical Exam Updated Vital Signs BP (!) 152/87 (BP Location: Right Arm)   Pulse 87   Temp 98 F (36.7 C) (Oral)   Resp 16   Ht 6' (1.829 m)   Wt 133.8 kg   SpO2 98%   BMI 40.01 kg/m   Physical Exam Vitals and nursing note reviewed.  Constitutional:      Appearance: He is well-developed.  HENT:     Head: Normocephalic and atraumatic.  Eyes:     Conjunctiva/sclera: Conjunctivae normal.     Pupils: Pupils are equal, round, and reactive to light.  Neck:     Comments: Patient has tenderness to palpation of the C-spine. He has tenderness over the paraspinal region and scapular region. Cardiovascular:     Rate and Rhythm: Normal rate and regular rhythm.     Heart sounds: Normal heart sounds.  Pulmonary:     Effort: Pulmonary effort is normal. No respiratory distress.     Breath sounds: Normal breath sounds. No wheezing.  Abdominal:     General: Bowel sounds are normal. There  is no distension.     Palpations: Abdomen is soft.     Tenderness: There is no abdominal tenderness. There is no guarding or rebound.  Musculoskeletal:     Cervical back: Normal range of motion and neck supple.  Skin:  General: Skin is warm.  Neurological:     Mental Status: He is alert and oriented to person, place, and time.     Comments: 1+ bilateral patellar reflex. Gross sensory exam for upper and lower extremities normal. Patient's upper and lower extremity strength is also 4+ out of 5 and equal.     ED Results / Procedures / Treatments   Labs (all labs ordered are listed, but only abnormal results are displayed) Labs Reviewed - No data to display  EKG None  Radiology No results found.  Procedures Procedures (including critical care time)  Medications Ordered in ED Medications  HYDROmorphone (DILAUDID) injection 2 mg (2 mg Intramuscular Given 06/09/20 1011)  dexamethasone (DECADRON) injection 10 mg (10 mg Intramuscular Given 06/09/20 1010)  naproxen (NAPROSYN) tablet 500 mg (500 mg Oral Given 06/09/20 1010)  diazepam (VALIUM) tablet 2 mg (2 mg Oral Given 06/09/20 1010)    ED Course  I have reviewed the triage vital signs and the nursing notes.  Pertinent labs & imaging results that were available during my care of the patient were reviewed by me and considered in my medical decision making (see chart for details).    MDM Rules/Calculators/A&P                          Patient comes in a chief complaint of neck pain. He is having pain radiating down his lumbar spine into his lower extremity. CT cervical spine ordered and it does not reveal any acute concerning findings. He has no red flags suggestive of cord compression.  Patient is to see a neurosurgeon, will recommend that he restart seeing them. For now we will focus on pain control.  Final Clinical Impression(s) / ED Diagnoses Final diagnoses:  Radiculopathy, unspecified spinal region    Rx / DC Orders ED  Discharge Orders         Ordered    oxyCODONE-acetaminophen (PERCOCET/ROXICET) 5-325 MG tablet  Every 4 hours PRN     Discontinue  Reprint     06/09/20 1210    ibuprofen (ADVIL) 600 MG tablet  Every 6 hours PRN     Discontinue  Reprint     06/09/20 1210    methocarbamol (ROBAXIN) 500 MG tablet  2 times daily     Discontinue  Reprint     06/09/20 [place], Kempton Milne, MD 06/11/20 1105

## 2020-06-21 ENCOUNTER — Ambulatory Visit (INDEPENDENT_AMBULATORY_CARE_PROVIDER_SITE_OTHER): Payer: No Typology Code available for payment source | Admitting: Orthopedic Surgery

## 2020-06-21 ENCOUNTER — Encounter: Payer: Self-pay | Admitting: Orthopedic Surgery

## 2020-06-21 ENCOUNTER — Other Ambulatory Visit: Payer: Self-pay

## 2020-06-21 ENCOUNTER — Ambulatory Visit: Payer: No Typology Code available for payment source

## 2020-06-21 ENCOUNTER — Telehealth: Payer: Self-pay | Admitting: Cardiology

## 2020-06-21 VITALS — Ht 72.0 in | Wt 290.0 lb

## 2020-06-21 DIAGNOSIS — M542 Cervicalgia: Secondary | ICD-10-CM | POA: Diagnosis not present

## 2020-06-21 DIAGNOSIS — M25512 Pain in left shoulder: Secondary | ICD-10-CM

## 2020-06-21 DIAGNOSIS — G8929 Other chronic pain: Secondary | ICD-10-CM

## 2020-06-21 NOTE — Telephone Encounter (Signed)
Wants to come off of Eliquis because of arthritis in shoulder and neck. Please call back

## 2020-06-21 NOTE — Progress Notes (Signed)
Chief Complaint  Patient presents with  . Shoulder Pain    L/some discomfort today/hurt real bad for about 2 months   59 year old male with previous bilateral shoulder surgery presents with a history of acute onset of cervical spine pain requiring him to go to the emergency room where he had a CAT scan which showed multilevels of degenerative disc disease with no acute disc herniation.  He was treated with Percocet and muscle relaxers and eventually saw neurosurgery who put him on prednisone.  The prednisone shot his sugar up to 500 as he is a diabetic.  Complicating factors is that he is also on Eliquis for prior atrial fibrillation although he has had an ablation.  He says the cardiologist thinks he is at risk for stroke and would like to keep him on the Eliquis.  He now complains of mild pain in his cervical spine pain and catching left shoulder.  Deltoid and acromial region with painful range of motion in a catch when abducting the shoulder  He is very concerned about his overall arthritis especially in his neck and possibly in his shoulder and that he cannot take anti-inflammatories and he is averse to trying opioids for pain relief  Review of Systems  Constitutional: Negative for fever.  HENT: Negative for hearing loss.   Respiratory: Negative for shortness of breath.   Cardiovascular: Negative for chest pain.  Musculoskeletal: Positive for neck pain.  Skin: Negative for rash.  Neurological: Negative for tingling and sensory change.   Ht 6' (1.829 m)   Wt 290 lb (131.5 kg)   BMI 39.33 kg/m   He is well-developed and well-nourished grooming and hygiene are normal he is alert and awake and oriented x3 he has a normal gait with no abnormalities or balance issues  He has tenderness at the base of his cervical spine but it is mild.  His trapezius muscles are nontender with no spasm.  He has painful forward elevation of his left shoulder up with painful abduction especially past 90  degrees and there is a catch from 90 degrees up to 120 degrees when flexing the arm especially out to the side he has no weakness in abduction or flexion but does have pain to resistance testing he has normal external rotation with his arm at his side but in abduction external rotation he has decreased external rotation and decreased internal rotation  He is otherwise neurovascularly intact with no lymphadenopathy noted skin is clean dry and intact without erythema  Shoulder x-rays taken in the office today shows mild arthritis in the glenohumeral joint with inferior osteophytes the joint space is symmetric throughout and shows no narrowing  He has a area that looks like he had an acromioplasty and maybe the distal clavicle removed as well  He had a CAT scan which I reviewed I do not see an acute disc herniation but I do see multilevel disc disease between C2 and T1  He had a MRI with him but I could not get it to load  Encounter Diagnoses  Name Primary?  . Chronic left shoulder pain Yes  . Cervicalgia     I injected his left shoulder subacromial space  Procedure note the subacromial injection shoulder left   Verbal consent was obtained to inject the  Left   Shoulder  Timeout was completed to confirm the injection site is a subacromial space of the  left  shoulder  Medication used Depo-Medrol 40 mg and lidocaine 1% 3 cc  Anesthesia  was provided by ethyl chloride  The injection was performed in the left  posterior subacromial space. After pinning the skin with alcohol and anesthetized the skin with ethyl chloride the subacromial space was injected using a 20-gauge needle. There were no complications  Sterile dressing was applied.  I think Gary Grimes should go ahead and finish his prednisone and then wait a week or 2 to see if his pain comes back or if he returns to baseline  He does indicate that he had 2 falls where he may have extended his neck and then a week or 2 after that had an  acute twisting injury to his neck which brought on his symptoms  He may find that his underlying disc disease and arthritis in the cervical spine was just aggravated by the injuries.  He will have a conversation with his cardiologist regarding Eliquis with the possibility of changing to aspirin or Plavix and then starting NSAID therapy again he does not want to start opioid therapy  I told him to use a heating pad adjust his computer use in terms of height of the computer or lap pad and uses muscle relaxers  I will see him in 4 weeks

## 2020-06-21 NOTE — Telephone Encounter (Signed)
Dr. [name] spoke with the pt and expained the rationale of why the Eliquis was needed. Pt advised to see a kidney doctor.

## 2020-06-21 NOTE — Telephone Encounter (Signed)
He cannot come off the medicine if he is taking it because that is a good reason why he is on that medicine.  We can switch to other medications such as Xarelto.  Please let me know.

## 2020-06-21 NOTE — Telephone Encounter (Signed)
We said pain doctor! Not kidney doctor

## 2020-06-21 NOTE — Telephone Encounter (Signed)
Message left to call back

## 2020-06-28 ENCOUNTER — Other Ambulatory Visit: Payer: Self-pay | Admitting: Orthopedic Surgery

## 2020-06-28 ENCOUNTER — Telehealth: Payer: Self-pay | Admitting: Orthopedic Surgery

## 2020-06-28 DIAGNOSIS — M542 Cervicalgia: Secondary | ICD-10-CM

## 2020-06-28 MED ORDER — MELOXICAM 7.5 MG PO TABS: 7.5000 mg | ORAL_TABLET | Freq: Every day | ORAL | 5 refills | Status: DC

## 2020-06-28 NOTE — Progress Notes (Signed)
CHOOSES TO STOP ELIQUIS , KNOWS THE RISKS

## 2020-06-28 NOTE — Telephone Encounter (Signed)
Call received from patient; relayed that as soon as he finished the Prednisone, that he started having same pain again. Patient asking for an anti-inflammatory medication if Dr Aline Brochure would recommend. States he is a diabetic; also said is on Eliquois, and would stop this medication if he is prescribed an anti-inflammatory.  [facility], [place], [place]

## 2020-07-12 ENCOUNTER — Telehealth: Payer: Self-pay | Admitting: Podiatry

## 2020-07-12 NOTE — Telephone Encounter (Signed)
Pt called wanted a refill pre gablin

## 2020-07-15 NOTE — Telephone Encounter (Signed)
I spoke with pt and he stated his PCP had increased the dosage of the Lyrica and at 1st insurance would not pay, but he later received a message that it was covered at the higher dosage. I told pt, Dr. Jacqualyn Posey would still like to see him for the plantar fasciitis but if his PCP was able to increase the dosing of the Lyrica and get it covered then he should have her continue to monitor it. Pt states understanding and would call when he was not driving and schedule to see Dr. Jacqualyn Posey.

## 2020-07-19 ENCOUNTER — Ambulatory Visit: Payer: No Typology Code available for payment source | Admitting: Orthopedic Surgery

## 2020-07-22 ENCOUNTER — Other Ambulatory Visit: Payer: Self-pay | Admitting: Pulmonary Disease

## 2020-07-23 ENCOUNTER — Other Ambulatory Visit: Payer: Self-pay | Admitting: Pulmonary Disease

## 2020-07-23 ENCOUNTER — Other Ambulatory Visit (HOSPITAL_COMMUNITY): Payer: Self-pay | Admitting: Pulmonary Disease

## 2020-07-23 MED ORDER — BELSOMRA 20 MG PO TABS: 1.0000 | ORAL_TABLET | Freq: Every day | ORAL | 1 refills | Status: DC

## 2020-07-23 NOTE — Telephone Encounter (Signed)
Tried calling the pt, no answer so LMTCB x 1  Will need appt since overdue- offer APP since AO booked out

## 2020-07-24 NOTE — Telephone Encounter (Signed)
LMTCB x2 for pt 

## 2020-07-25 NOTE — Telephone Encounter (Signed)
I'm going to forward to Dr. Ander Slade. It's not on his medication list and I haven't met him personally yet.

## 2020-07-25 NOTE — Telephone Encounter (Signed)
Ov has been scheduled with Beth.  Beth - please advise if you could refill this pt's Ambien until his appointment. Thanks.

## 2020-07-30 ENCOUNTER — Other Ambulatory Visit: Payer: Self-pay | Admitting: Orthopedic Surgery

## 2020-07-30 MED ORDER — TIZANIDINE HCL 4 MG PO TABS
4.0000 mg | ORAL_TABLET | Freq: Four times a day (QID) | ORAL | 0 refills | Status: DC | PRN
Start: 2020-07-30 — End: 2020-08-07

## 2020-07-30 NOTE — Telephone Encounter (Signed)
Patient called to request Dr Aline Brochure to order refill on medication Tizanidine 4mg /quantity 60 - [facility] [place].

## 2020-07-31 IMAGING — CT CT ABDOMEN AND PELVIS WITH CONTRAST
2 of 5 series · 16 of 46 positions shown, 18 images · IV contrast (APPLIED)
Comparison: Report from 10/01/2000

CLINICAL DATA: Increasing abdominal pain, generalized

EXAM:
CT ABDOMEN AND PELVIS WITH CONTRAST
TECHNIQUE: Multidetector CT imaging of the abdomen and pelvis was performed
using the standard protocol following bolus administration of
intravenous contrast.
CONTRAST:  100mL OMNIPAQUE IOHEXOL 300 MG/ML  SOLN

[Series 2: axial st · axial · 0.98mm/px · z∈[-600,-50]mm · 13 of 124 slices shown, 15 images]
[im 7/124  soft-tissue]
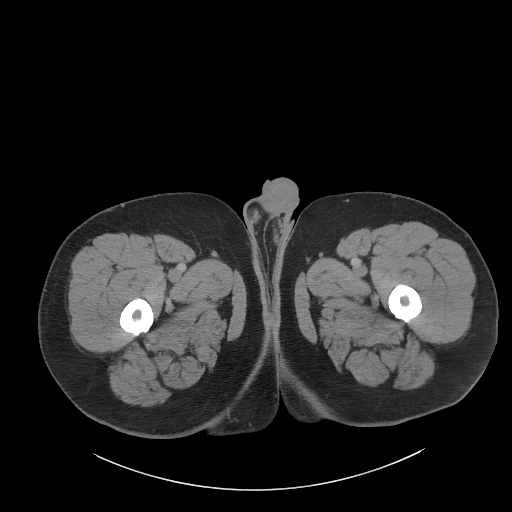
[im 7/124  bone]
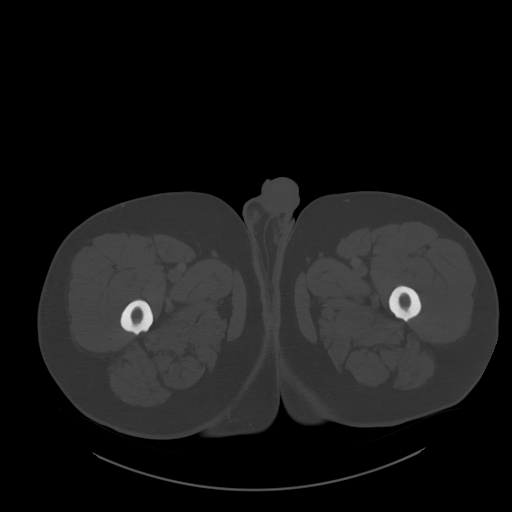
[im 19/124  soft-tissue]
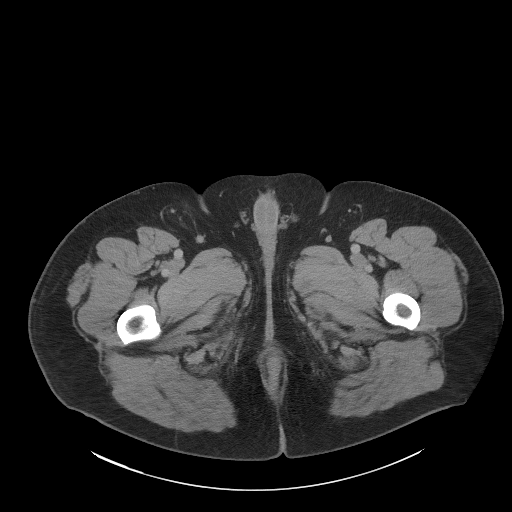
[im 25/124  soft-tissue]
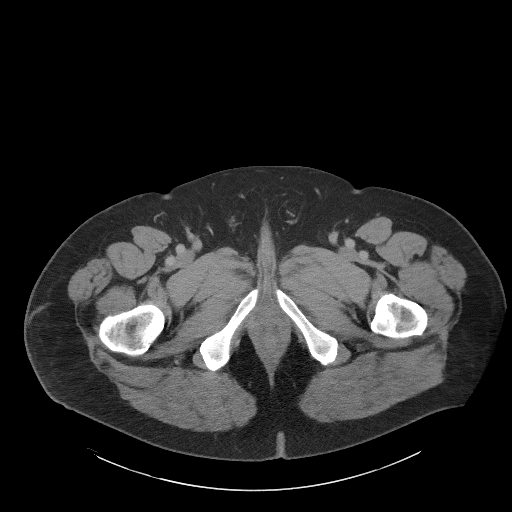
[im 37/124  soft-tissue]
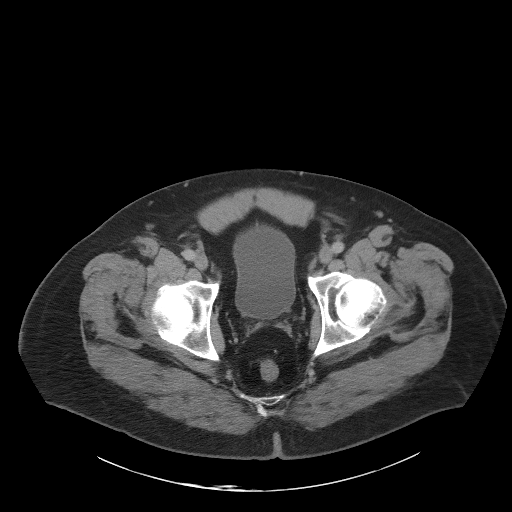
[im 44/124  soft-tissue]
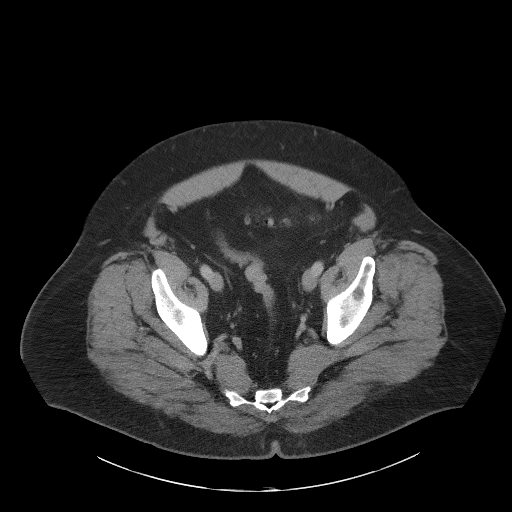
[im 56/124  soft-tissue]
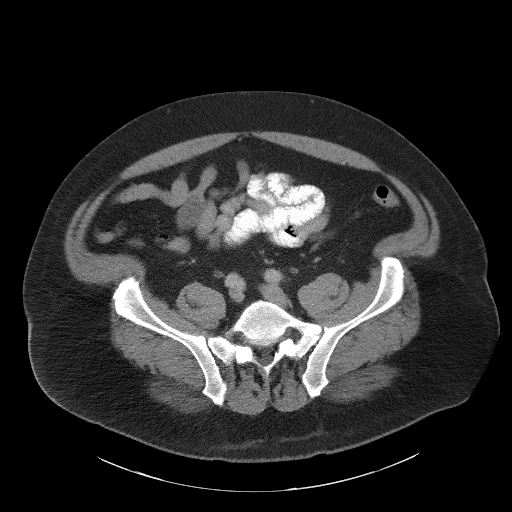
[im 62/124  soft-tissue]
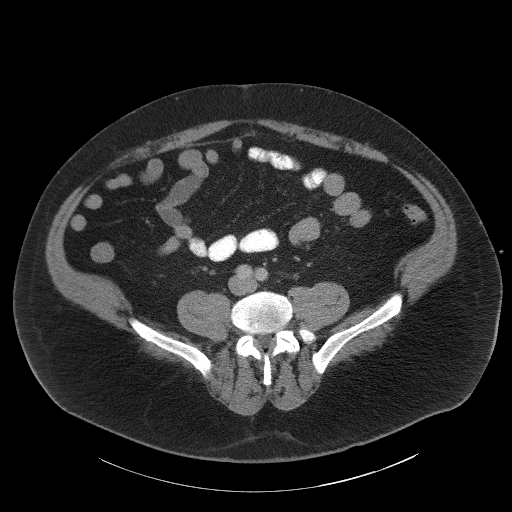
[im 68/124  soft-tissue]
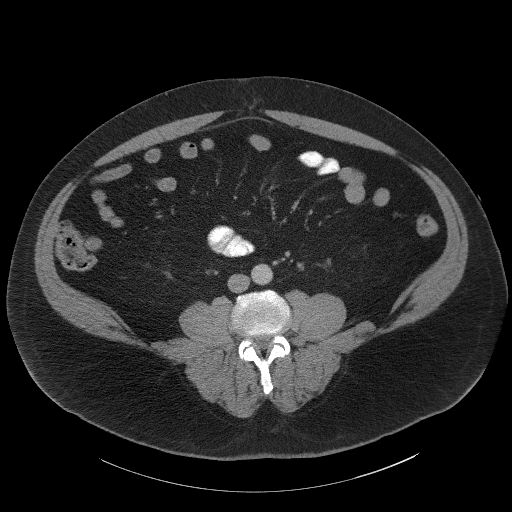
[im 80/124  soft-tissue]
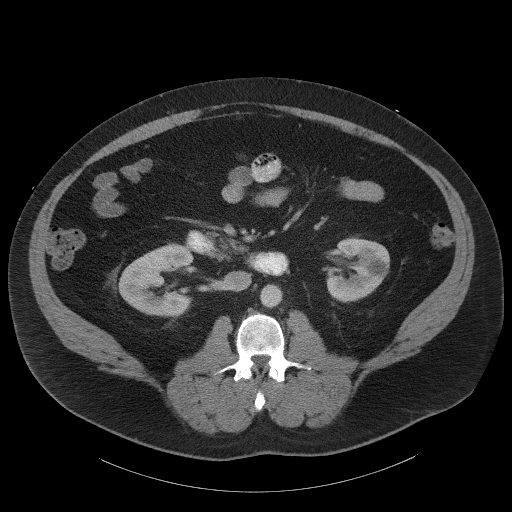
[im 80/124  bone]
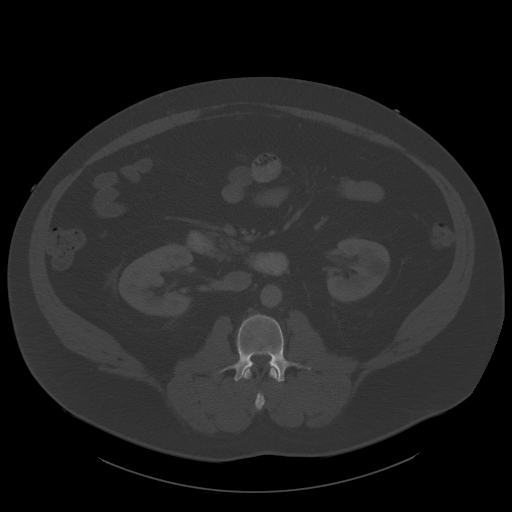
[im 87/124  soft-tissue]
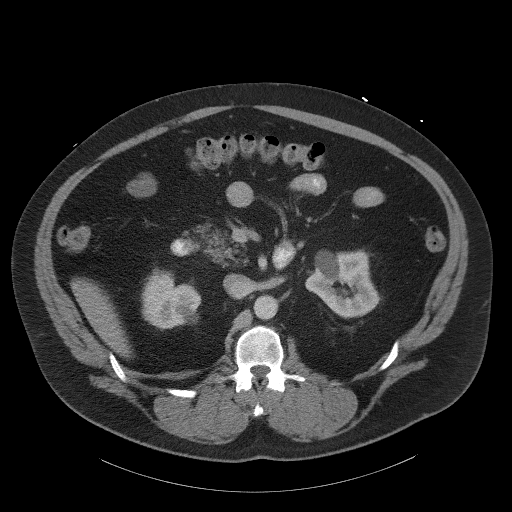
[im 99/124  soft-tissue]
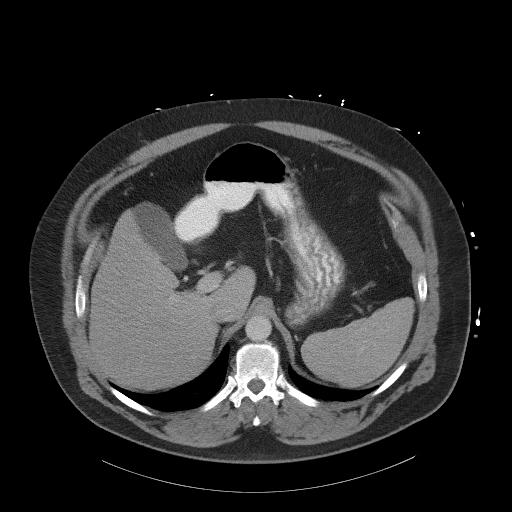
[im 105/124  soft-tissue]
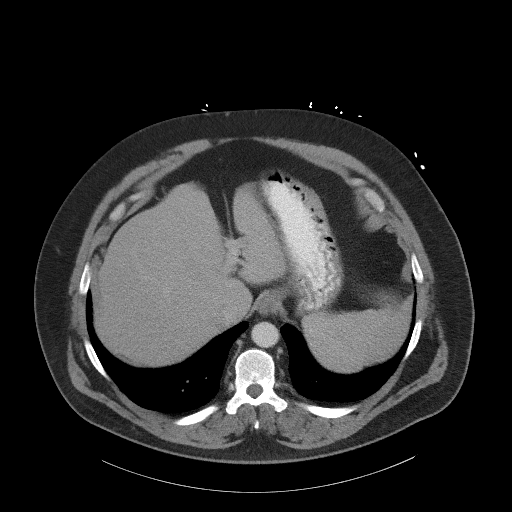
[im 117/124  soft-tissue]
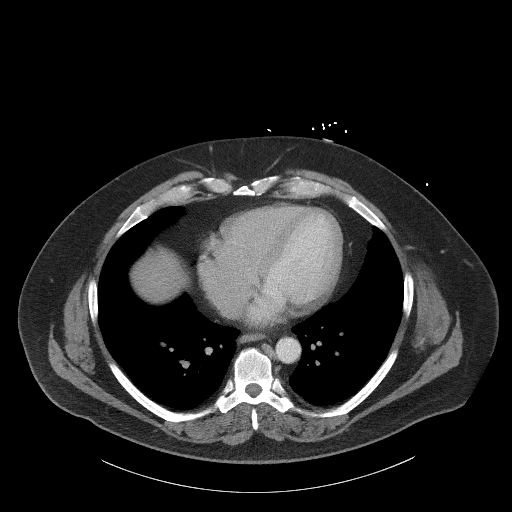

[Series 5: coronal st · coronal · 1.02mm/px · 3 of 127 slices shown]
[im 43/127  soft-tissue]
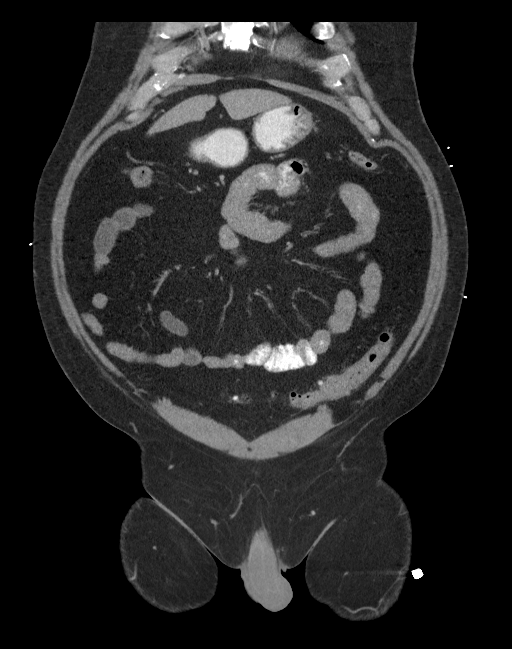
[im 57/127  soft-tissue]
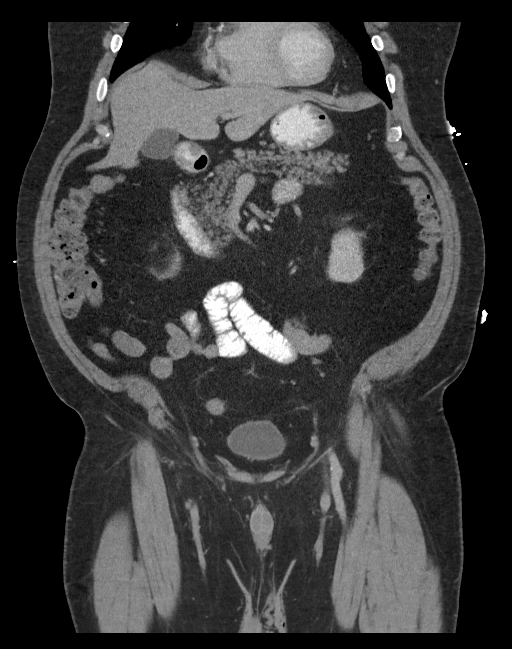
[im 71/127  soft-tissue]
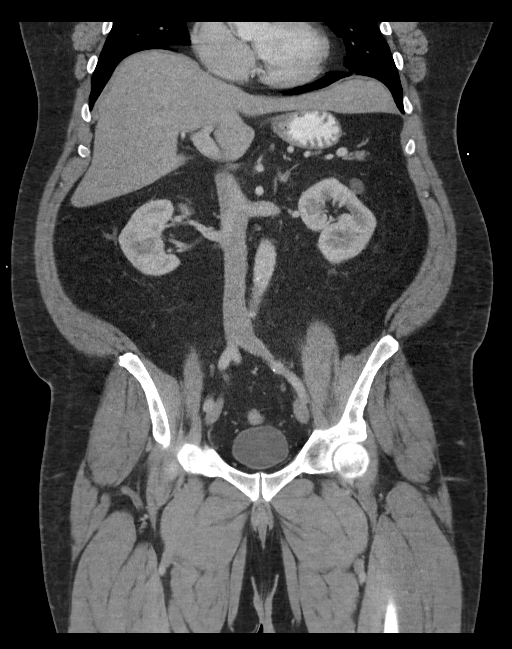

[16 of 46 positions shown; findings below may reference images not displayed]

FINDINGS: Lower chest: Right coronary artery atherosclerotic calcification.

Hepatobiliary: Unremarkable

Pancreas: Unremarkable

Spleen: Unremarkable

Adrenals/Urinary Tract: A 1.8 by 1.8 cm left adrenal mass has a
relative washout of 40% compatible with adenoma.

Fluid density lesions in both kidneys favor cysts. Urinary bladder
unremarkable.

Stomach/Bowel: Sigmoid diverticulosis.

Vascular/Lymphatic: Mild abdominal aortic atherosclerotic vascular
calcification.

Reproductive: Unremarkable

Other: No supplemental non-categorized findings.

Musculoskeletal: Ventral hernia mesh noted. There is a 4.0 cm
recurrent fat containing ventral hernia believed to be extending
along the left inferior margin of the mesh.
IMPRESSION: 1. A 4.0 cm recurrent fat containing ventral hernia is present in
the umbilical region, likely extending around the left inferior
margin of the hernia mesh.
2. Aortic Atherosclerosis ([id number]). Right coronary artery
atherosclerotic calcification.
3. Small left adrenal adenoma.
4. Bilateral renal cysts.
5. Sigmoid colon diverticulosis without findings of active
diverticulitis.

## 2020-07-31 NOTE — Telephone Encounter (Signed)
Dr. Ander Slade - please advise on refill. Thanks.

## 2020-08-01 ENCOUNTER — Other Ambulatory Visit: Payer: Self-pay

## 2020-08-01 ENCOUNTER — Ambulatory Visit (INDEPENDENT_AMBULATORY_CARE_PROVIDER_SITE_OTHER): Payer: PRIVATE HEALTH INSURANCE | Admitting: Primary Care

## 2020-08-01 ENCOUNTER — Encounter: Payer: Self-pay | Admitting: Primary Care

## 2020-08-01 VITALS — BP 132/64 | HR 68 | Temp 98.5°F | Ht 72.0 in | Wt 304.2 lb

## 2020-08-01 DIAGNOSIS — Z6841 Body Mass Index (BMI) 40.0 and over, adult: Secondary | ICD-10-CM

## 2020-08-01 DIAGNOSIS — G4730 Sleep apnea, unspecified: Secondary | ICD-10-CM | POA: Diagnosis not present

## 2020-08-01 DIAGNOSIS — E663 Overweight: Secondary | ICD-10-CM | POA: Diagnosis not present

## 2020-08-01 DIAGNOSIS — F5104 Psychophysiologic insomnia: Secondary | ICD-10-CM

## 2020-08-01 HISTORY — DX: Psychophysiologic insomnia: F51.04

## 2020-08-01 MED ORDER — ZOLPIDEM TARTRATE ER 12.5 MG PO TBCR
12.5000 mg | EXTENDED_RELEASE_TABLET | Freq: Every evening | ORAL | 3 refills | Status: DC | PRN
Start: 2020-08-01 — End: 2020-10-14

## 2020-08-01 NOTE — Assessment & Plan Note (Signed)
-   He has developed a tolerance to Belsomra and is having difficulty falling asleep 4-5 nights a week despite medication - Plan to change Belsomra to Ambien CR 12.5mg  at bedtime  - Advised patient not to take Trazodone with Ambien at this time  - Continue to work on sleep hygiene, patient education given. He has been to sleep counselor in the past

## 2020-08-01 NOTE — Patient Instructions (Addendum)
Nice to meet you today Gary Grimes  Insomnia: Stop Belsomra; start Ambien CR 12.5mg  at bedtime Continue to work on sleep hygiene as discussed   OSA: Continue to wear CPAP every night for 4-6 hours or more each night Do not drive if experiencing excessive daytime fatigue or somnolence  Continue to work on weight loss, we will refer you to healthy weight and wellness  Referral: Healthy weight and wellness re: BMI 41  Follow-up: 6 months with Dr. Ander Slade    Insomnia Insomnia is a sleep disorder that makes it difficult to fall asleep or stay asleep. Insomnia can cause fatigue, low energy, difficulty concentrating, mood swings, and poor performance at work or school. There are three different ways to classify insomnia:  Difficulty falling asleep.  Difficulty staying asleep.  Waking up too early in the morning. Any type of insomnia can be long-term (chronic) or short-term (acute). Both are common. Short-term insomnia usually lasts for three months or less. Chronic insomnia occurs at least three times a week for longer than three months. What are the causes? Insomnia may be caused by another condition, situation, or substance, such as:  Anxiety.  Certain medicines.  Gastroesophageal reflux disease (GERD) or other gastrointestinal conditions.  Asthma or other breathing conditions.  Restless legs syndrome, sleep apnea, or other sleep disorders.  Chronic pain.  Menopause.  Stroke.  Abuse of alcohol, tobacco, or illegal drugs.  Mental health conditions, such as depression.  Caffeine.  Neurological disorders, such as Alzheimer's disease.  An overactive thyroid (hyperthyroidism). Sometimes, the cause of insomnia may not be known. What increases the risk? Risk factors for insomnia include:  Gender. Women are affected more often than men.  Age. Insomnia is more common as you get older.  Stress.  Lack of exercise.  Irregular work schedule or working night  shifts.  Traveling between different time zones.  Certain medical and mental health conditions. What are the signs or symptoms? If you have insomnia, the main symptom is having trouble falling asleep or having trouble staying asleep. This may lead to other symptoms, such as:  Feeling fatigued or having low energy.  Feeling nervous about going to sleep.  Not feeling rested in the morning.  Having trouble concentrating.  Feeling irritable, anxious, or depressed. How is this diagnosed? This condition may be diagnosed based on:  Your symptoms and medical history. Your health care provider may ask about: ? Your sleep habits. ? Any medical conditions you have. ? Your mental health.  A physical exam. How is this treated? Treatment for insomnia depends on the cause. Treatment may focus on treating an underlying condition that is causing insomnia. Treatment may also include:  Medicines to help you sleep.  Counseling or therapy.  Lifestyle adjustments to help you sleep better. Follow these instructions at home: Eating and drinking   Limit or avoid alcohol, caffeinated beverages, and cigarettes, especially close to bedtime. These can disrupt your sleep.  Do not eat a large meal or eat spicy foods right before bedtime. This can lead to digestive discomfort that can make it hard for you to sleep. Sleep habits   Keep a sleep diary to help you and your health care provider figure out what could be causing your insomnia. Write down: ? When you sleep. ? When you wake up during the night. ? How well you sleep. ? How rested you feel the next day. ? Any side effects of medicines you are taking. ? What you eat and drink.  Make  your bedroom a dark, comfortable place where it is easy to fall asleep. ? Put up shades or blackout curtains to block light from outside. ? Use a white noise machine to block noise. ? Keep the temperature cool.  Limit screen use before bedtime. This  includes: ? Watching TV. ? Using your smartphone, tablet, or computer.  Stick to a routine that includes going to bed and waking up at the same times every day and night. This can help you fall asleep faster. Consider making a quiet activity, such as reading, part of your nighttime routine.  Try to avoid taking naps during the day so that you sleep better at night.  Get out of bed if you are still awake after 15 minutes of trying to sleep. Keep the lights down, but try reading or doing a quiet activity. When you feel sleepy, go back to bed. General instructions  Take over-the-counter and prescription medicines only as told by your health care provider.  Exercise regularly, as told by your health care provider. Avoid exercise starting several hours before bedtime.  Use relaxation techniques to manage stress. Ask your health care provider to suggest some techniques that may work well for you. These may include: ? Breathing exercises. ? Routines to release muscle tension. ? Visualizing peaceful scenes.  Make sure that you drive carefully. Avoid driving if you feel very sleepy.  Keep all follow-up visits as told by your health care provider. This is important. Contact a health care provider if:  You are tired throughout the day.  You have trouble in your daily routine due to sleepiness.  You continue to have sleep problems, or your sleep problems get worse. Get help right away if:  You have serious thoughts about hurting yourself or someone else. If you ever feel like you may hurt yourself or others, or have thoughts about taking your own life, get help right away. You can go to your nearest emergency department or call:  Your local emergency services (911 in the [place]  A suicide crisis helpline, such as the [place] at 775-479-4449. This is open 24 hours a day. Summary  Insomnia is a sleep disorder that makes it difficult to fall asleep or stay  asleep.  Insomnia can be long-term (chronic) or short-term (acute).  Treatment for insomnia depends on the cause. Treatment may focus on treating an underlying condition that is causing insomnia.  Keep a sleep diary to help you and your health care provider figure out what could be causing your insomnia. This information is not intended to replace advice given to you by your health care provider. Make sure you discuss any questions you have with your health care provider. Document Revised: 11/26/2017 Document Reviewed: 09/23/2017 Elsevier Patient Education  [employer].  CPAP and BPAP Information CPAP and BPAP are methods of helping a person breathe with the use of air pressure. CPAP stands for "continuous positive airway pressure." BPAP stands for "bi-level positive airway pressure." In both methods, air is blown through your nose or mouth and into your air passages to help you breathe well. CPAP and BPAP use different amounts of pressure to blow air. With CPAP, the amount of pressure stays the same while you breathe in and out. With BPAP, the amount of pressure is increased when you breathe in (inhale) so that you can take larger breaths. Your health care provider will recommend whether CPAP or BPAP would be more helpful for you. Why are CPAP and  BPAP treatments used? CPAP or BPAP can be helpful if you have:  Sleep apnea.  Chronic obstructive pulmonary disease (COPD).  Heart failure.  Medical conditions that weaken the muscles of the chest including muscular dystrophy, or neurological diseases such as amyotrophic lateral sclerosis (ALS).  Other problems that cause breathing to be weak, abnormal, or difficult. CPAP is most commonly used for obstructive sleep apnea (OSA) to keep the airways from collapsing when the muscles relax during sleep. How is CPAP or BPAP administered? Both CPAP and BPAP are provided by a small machine with a flexible plastic tube that attaches to a plastic  mask. You wear the mask. Air is blown through the mask into your nose or mouth. The amount of pressure that is used to blow the air can be adjusted on the machine. Your health care provider will determine the pressure setting that should be used based on your individual needs. When should CPAP or BPAP be used? In most cases, the mask only needs to be worn during sleep. Generally, the mask needs to be worn throughout the night and during any daytime naps. People with certain medical conditions may also need to wear the mask at other times when they are awake. Follow instructions from your health care provider about when to use the machine. What are some tips for using the mask?   Because the mask needs to be snug, some people feel trapped or closed-in (claustrophobic) when first using the mask. If you feel this way, you may need to get used to the mask. One way to do this is by holding the mask loosely over your nose or mouth and then gradually applying the mask more snugly. You can also gradually increase the amount of time that you use the mask.  Masks are available in various types and sizes. Some fit over your mouth and nose while others fit over just your nose. If your mask does not fit well, talk with your health care provider about getting a different one.  If you are using a mask that fits over your nose and you tend to breathe through your mouth, a chin strap may be applied to help keep your mouth closed.  The CPAP and BPAP machines have alarms that may sound if the mask comes off or develops a leak.  If you have trouble with the mask, it is very important that you talk with your health care provider about finding a way to make the mask easier to tolerate. Do not stop using the mask. Stopping the use of the mask could have a negative impact on your health. What are some tips for using the machine?  Place your CPAP or BPAP machine on a secure table or stand near an electrical outlet.  Know  where the on/off switch is located on the machine.  Follow instructions from your health care provider about how to set the pressure on your machine and when you should use it.  Do not eat or drink while the CPAP or BPAP machine is on. Food or fluids could get pushed into your lungs by the pressure of the CPAP or BPAP.  Do not smoke. Tobacco smoke residue can damage the machine.  For home use, CPAP and BPAP machines can be rented or purchased through home health care companies. Many different brands of machines are available. Renting a machine before purchasing may help you find out which particular machine works well for you.  Keep the CPAP or BPAP machine  and attachments clean. Ask your health care provider for specific instructions. Get help right away if:  You have redness or open areas around your nose or mouth where the mask fits.  You have trouble using the CPAP or BPAP machine.  You cannot tolerate wearing the CPAP or BPAP mask.  You have pain, discomfort, and bloating in your abdomen. Summary  CPAP and BPAP are methods of helping a person breathe with the use of air pressure.  Both CPAP and BPAP are provided by a small machine with a flexible plastic tube that attaches to a plastic mask.  If you have trouble with the mask, it is very important that you talk with your health care provider about finding a way to make the mask easier to tolerate. This information is not intended to replace advice given to you by your health care provider. Make sure you discuss any questions you have with your health care provider. Document Revised: 04/05/2019 Document Reviewed: 11/02/2016 Elsevier Patient Education  [place].

## 2020-08-01 NOTE — Progress Notes (Signed)
@Patient  ID: Gary Grimes, male    DOB: May 15, 1961, 59 y.o.   MRN: [id number]  Chief Complaint  Patient presents with  . Follow-up    Wanting Ambien rx,CPAP wears 7 hrs. each night,pr. good    Referring provider: Hayden Rasmussen, MD  HPI: 59 year old male. PMH significant for OSA on CPAP, chronic insomnia, obesity. Patient of Dr. Ander Slade, last seen on 03/19/20. Maintained on CPAP 13cm h20.   08/01/2020 Patient presents today for regular follow-up for OSA. He continues having difficulty with insomnia. Reports that he has developed a tolerance to Belsomra. He also takes trazodone 200mg  at bedtime to help with his sleep. Normal bedtime is 9pm and he wakes up at 7am. He has difficulty falling asleep 4-5 times a week, taking him anywhere from 90-157min to initial sleep. He is having a hard time waking up in the morning d/t poor quality of sleep.  He is no longer on oxycodone, this was previously prescribed after an orthopedic surgery. PMP reviewed.   [employer] download 07/01/20-07/30/20: 30/30 days used Average usage days used 7 hours 12 mins Pressure 13cm h20 AHI 4.3  Allergies  Allergen Reactions  . Morphine Other (See Comments)    PT BECAME DELIRIOUS     Immunization History  Administered Date(s) Administered  . Influenza,inj,Quad PF,6+ Mos 12/29/2015, 10/29/2018  . Influenza,inj,Quad PF,6-35 Mos 09/28/2019  . PFIZER SARS-COV-2 Vaccination 03/27/2020, 04/17/2020  . Pneumococcal Polysaccharide-23 10/29/2018    Past Medical History:  Diagnosis Date  . Anginal pain ([place])    ER visit 01/14/2014 visit on chart   . Anxiety    pt denies  . Bipolar disorder ([place])   . Chronic lower back pain   . Depression    did get seen in er 4/13 for evaluation-psyc situational none recent  . GERD (gastroesophageal reflux disease)   . Headache    none recent  . High cholesterol   . High triglycerides   . Hypertension   . Levator syndrome 2001   history   . Neuropathy   . OSA on CPAP   .  Panic attacks   . Perirectal abscess   . S/P ablation of atrial fibrillation   . Type II diabetes mellitus ([facility])     Tobacco History: Social History   Tobacco Use  Smoking Status Former Smoker  . Packs/day: 0.50  . Years: 1.00  . Pack years: 0.50  . Types: Cigarettes  Smokeless Tobacco Never Used  Tobacco Comment   quit 1983   Counseling given: Not Answered Comment: quit 1983   Outpatient Medications Prior to Visit  Medication Sig Dispense Refill  . Ascorbic Acid (VITAMIN C) 1000 MG tablet Take 4,000 mg by mouth daily.     . B-D INS SYR ULTRAFINE 1CC/30G 30G X 1/2" 1 ML MISC AS DIRECTED TO USE TO DELIVER HUMALOG AS NEEDED PER SLIDING SCALE SUBCUTANOUS . 100 DAYS    . ELIQUIS 5 MG TABS tablet TAKE 1 TABLET BY MOUTH TWICE A DAY (Patient taking differently: Take 5 mg by mouth 2 (two) times daily. ) 60 tablet 5  . hydrOXYzine (ATARAX/VISTARIL) 25 MG tablet Take 25 mg by mouth at bedtime.    Marland Kitchen ibuprofen (ADVIL) 600 MG tablet Take 1 tablet (600 mg total) by mouth every 6 (six) hours as needed. 30 tablet 0  . meloxicam (MOBIC) 7.5 MG tablet Take 1 tablet (7.5 mg total) by mouth daily. 30 tablet 5  . methocarbamol (ROBAXIN) 500 MG tablet Take 1 tablet (  500 mg total) by mouth 2 (two) times daily. 10 tablet 0  . NOVOLIN 70/30 (70-30) 100 UNIT/ML injection Inject 36 Units into the skin 2 (two) times daily with a meal. Per sliding scale    . olmesartan (BENICAR) 40 MG tablet Take 0.5 tablets (20 mg total) by mouth daily. (Patient taking differently: Take 40 mg by mouth daily. ) 15 tablet 2  . omeprazole (PRILOSEC) 40 MG capsule Take 40 mg by mouth as needed (heartburn).     Marland Kitchen oxyCODONE-acetaminophen (PERCOCET/ROXICET) 5-325 MG tablet Take 2 tablets by mouth every 4 (four) hours as needed for severe pain. 6 tablet 0  . OZEMPIC, 1 MG/DOSE, 2 MG/1.5ML SOPN Inject 1 mg into the skin once a week.    . phentermine (ADIPEX-P) 37.5 MG tablet Take 37.5 mg by mouth daily.    . pioglitazone (ACTOS) 30  MG tablet Take 30 mg by mouth every morning.     . pregabalin (LYRICA) 150 MG capsule Take 1 capsule (150 mg total) by mouth 2 (two) times daily. 60 capsule 2  . Suvorexant (BELSOMRA) 20 MG TABS Take 1 tablet by mouth at bedtime. 30 tablet 1  . tiZANidine (ZANAFLEX) 4 MG tablet Take 1 tablet (4 mg total) by mouth every 6 (six) hours as needed for muscle spasms. 60 tablet 0  . traZODone (DESYREL) 100 MG tablet Take 1 tablet (100 mg total) by mouth at bedtime as needed for sleep. (Patient taking differently: Take 100 mg by mouth at bedtime. ) 30 tablet 0  . albuterol (VENTOLIN HFA) 108 (90 Base) MCG/ACT inhaler Inhale 2 puffs into the lungs every 6 (six) hours as needed for wheezing or shortness of breath.  (Patient not taking: Reported on 08/01/2020)    . atorvastatin (LIPITOR) 10 MG tablet Take 1 tablet (10 mg total) by mouth daily. 90 tablet 3  . nitroGLYCERIN (NITROSTAT) 0.4 MG SL tablet Place 1 tablet (0.4 mg total) under the tongue every 5 (five) minutes as needed for up to 25 days for chest pain. 25 tablet 11   No facility-administered medications prior to visit.    Review of Systems  Review of Systems  Constitutional: Positive for fatigue.       Daytime fatigue  Respiratory: Negative for cough, chest tightness and wheezing.        DOE d/t weight    Physical Exam  BP 132/64 (BP Location: Left Arm, Cuff Size: Large)   Pulse 68   Temp 98.5 F (36.9 C) (Oral)   Ht 6' (1.829 m)   Wt (!) 304 lb 3.2 oz (138 kg)   SpO2 98%   BMI 41.26 kg/m  Physical Exam Constitutional:      General: He is not in acute distress.    Appearance: Normal appearance. He is obese. He is not ill-appearing.  HENT:     Head: Normocephalic and atraumatic.     Mouth/Throat:     Mouth: Mucous membranes are moist.     Pharynx: Oropharynx is clear. No oropharyngeal exudate or posterior oropharyngeal erythema.  Cardiovascular:     Rate and Rhythm: Normal rate and regular rhythm.  Pulmonary:     Effort:  Pulmonary effort is normal.     Breath sounds: Normal breath sounds. No wheezing or rhonchi.  Neurological:     General: No focal deficit present.     Mental Status: He is alert and oriented to person, place, and time. Mental status is at baseline.  Psychiatric:  Mood and Affect: Mood normal.        Behavior: Behavior normal.        Thought Content: Thought content normal.        Judgment: Judgment normal.      Lab Results:  CBC    Component Value Date/Time   WBC 6.2 11/03/2019 0752   RBC 4.11 (L) 11/03/2019 0752   HGB 13.5 11/03/2019 0752   HGB 14.3 10/10/2018 1505   HCT 40.6 11/03/2019 0752   HCT 42.2 10/10/2018 1505   PLT 245 11/03/2019 0752   PLT 296 10/10/2018 1505   MCV 98.8 11/03/2019 0752   MCV 90 10/10/2018 1505   MCH 32.8 11/03/2019 0752   MCHC 33.3 11/03/2019 0752   RDW 13.9 11/03/2019 0752   RDW 14.1 10/10/2018 1505   LYMPHSABS 3.1 05/10/2019 1447   MONOABS 0.9 05/10/2019 1447   EOSABS 0.2 05/10/2019 1447   BASOSABS 0.0 05/10/2019 1447    BMET    Component Value Date/Time   NA 138 04/03/2020 1203   K 4.3 04/03/2020 1203   CL 103 04/03/2020 1203   CO2 24 04/03/2020 1203   GLUCOSE 141 (H) 04/03/2020 1203   GLUCOSE 135 (H) 11/03/2019 0752   BUN 15 04/03/2020 1203   CREATININE 1.36 (H) 04/03/2020 1203   CALCIUM 9.6 04/03/2020 1203   GFRNONAA 56 (L) 04/03/2020 1203   GFRAA 64 04/03/2020 1203    BNP No results found for: BNP  ProBNP No results found for: PROBNP  Imaging: No results found.   Assessment & Plan:   Sleep apnea - Patient is 100% compliant with CPAP use wearing average 7 hours 12 mins - Pressure 13cm h20; AHI 4.3 - No changes today  - Continue to wear CPAP every night for 4-6 hours or more each night - Do not drive if experiencing excessive daytime fatigue or somnolence  - FU in 6 months with Dr. Ander Slade   Chronic insomnia - He has developed a tolerance to Belsomra and is having difficulty falling asleep 4-5 nights a  week despite medication - Plan to change Belsomra to Ambien CR 12.5mg  at bedtime  - Advised patient not to take Trazodone with Ambien at this time  - Continue to work on sleep hygiene, patient education given. He has been to sleep counselor in the past   Overweight - BMI 41, refer to healthy weight and wellness d/t obesity with co-morbidities    Martyn Ehrich, NP 08/01/2020

## 2020-08-01 NOTE — Assessment & Plan Note (Signed)
-   BMI 41, refer to healthy weight and wellness d/t obesity with co-morbidities

## 2020-08-01 NOTE — Assessment & Plan Note (Addendum)
-   Patient is 100% compliant with CPAP use wearing average 7 hours 12 mins - Pressure 13cm h20; AHI 4.3 - No changes today  - Continue to wear CPAP every night for 4-6 hours or more each night - Do not drive if experiencing excessive daytime fatigue or somnolence  - FU in 6 months with Dr. Ander Slade

## 2020-08-06 ENCOUNTER — Other Ambulatory Visit: Payer: Self-pay

## 2020-08-06 ENCOUNTER — Ambulatory Visit (INDEPENDENT_AMBULATORY_CARE_PROVIDER_SITE_OTHER): Payer: PRIVATE HEALTH INSURANCE | Admitting: Family Medicine

## 2020-08-06 ENCOUNTER — Encounter (INDEPENDENT_AMBULATORY_CARE_PROVIDER_SITE_OTHER): Payer: Self-pay | Admitting: Family Medicine

## 2020-08-06 VITALS — BP 124/76 | HR 84 | Temp 98.5°F | Ht 71.0 in | Wt 297.0 lb

## 2020-08-06 DIAGNOSIS — E1169 Type 2 diabetes mellitus with other specified complication: Secondary | ICD-10-CM

## 2020-08-06 DIAGNOSIS — Z1331 Encounter for screening for depression: Secondary | ICD-10-CM | POA: Diagnosis not present

## 2020-08-06 DIAGNOSIS — G4733 Obstructive sleep apnea (adult) (pediatric): Secondary | ICD-10-CM

## 2020-08-06 DIAGNOSIS — Z9189 Other specified personal risk factors, not elsewhere classified: Secondary | ICD-10-CM | POA: Diagnosis not present

## 2020-08-06 DIAGNOSIS — E785 Hyperlipidemia, unspecified: Secondary | ICD-10-CM

## 2020-08-06 DIAGNOSIS — R0602 Shortness of breath: Secondary | ICD-10-CM | POA: Diagnosis not present

## 2020-08-06 DIAGNOSIS — E1159 Type 2 diabetes mellitus with other circulatory complications: Secondary | ICD-10-CM | POA: Diagnosis not present

## 2020-08-06 DIAGNOSIS — Z6841 Body Mass Index (BMI) 40.0 and over, adult: Secondary | ICD-10-CM

## 2020-08-06 DIAGNOSIS — R5383 Other fatigue: Secondary | ICD-10-CM | POA: Diagnosis not present

## 2020-08-06 DIAGNOSIS — Z0289 Encounter for other administrative examinations: Secondary | ICD-10-CM

## 2020-08-06 DIAGNOSIS — Z794 Long term (current) use of insulin: Secondary | ICD-10-CM

## 2020-08-06 DIAGNOSIS — F319 Bipolar disorder, unspecified: Secondary | ICD-10-CM

## 2020-08-06 DIAGNOSIS — I10 Essential (primary) hypertension: Secondary | ICD-10-CM

## 2020-08-06 DIAGNOSIS — Z9989 Dependence on other enabling machines and devices: Secondary | ICD-10-CM

## 2020-08-06 DIAGNOSIS — F5104 Psychophysiologic insomnia: Secondary | ICD-10-CM

## 2020-08-06 DIAGNOSIS — I152 Hypertension secondary to endocrine disorders: Secondary | ICD-10-CM

## 2020-08-06 DIAGNOSIS — M2550 Pain in unspecified joint: Secondary | ICD-10-CM

## 2020-08-06 NOTE — Progress Notes (Addendum)
Dear Gary Pitter, NP,   Thank you for referring Gary Grimes to our clinic. The following note includes my evaluation and treatment recommendations.  Chief Complaint:   OBESITY Gary Grimes (MR# [id number]) is a 59 y.o. male who presents for evaluation and treatment of obesity and related comorbidities. Current BMI is Body mass index is 41.42 kg/m. Gary Grimes has been struggling with his weight for many years and has been unsuccessful in either losing weight, maintaining weight loss, or reaching his healthy weight goal.  Gary Grimes is currently in the action stage of change and ready to dedicate time achieving and maintaining a healthier weight. Gary Grimes is interested in becoming our patient and working on intensive lifestyle modifications including (but not limited to) diet and exercise for weight loss.  Gary Grimes says that Gary Grimes is interested in bariatric surgery.  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  Gary Grimes is widowed and lives alone.  Gary Grimes reports overeating and emotional eating.  Gary Grimes says that 4 years ago, Gary Grimes went through a break up. Asked about phentermine.  Psychiatry will not put him on ADHD medications due to mania/hypomania.  Gary Grimes's habits were reviewed today and are as follows: his desired weight loss is 114 pounds, Gary Grimes has been heavy most of his life, Gary Grimes started gaining weight at 59 years old, his heaviest weight ever was 325 pounds, Gary Grimes craves all carbs, Gary Grimes wakes up frequently in the middle of the night to eat, Gary Grimes frequently makes poor food choices, Gary Grimes has problems with excessive hunger, Gary Grimes frequently eats larger portions than normal and Gary Grimes struggles with emotional eating.  Depression Screen Gary Grimes's Food and Mood (modified PHQ-9) score was 23.  Depression screen PHQ 2/9 08/06/2020  Gary Grimes 3  Down, Depressed, Hopeless 3  PHQ - 2 Score 6  Altered sleeping 3  Tired, Gary energy 3  Change in appetite 3  Feeling bad or failure about yourself  3  Trouble concentrating 3  Moving  slowly or fidgety/restless 0  Suicidal thoughts 2  PHQ-9 Score 23  Difficult doing work/chores Very difficult   Subjective:   1. Other fatigue Gary Grimes admits to daytime somnolence and reports waking up still tired. Patent has a history of symptoms of daytime fatigue, morning fatigue and snoring. Gary Grimes generally gets 6 hours of sleep per night, and states that Gary Grimes has poor quality sleep. Snoring is present. Apneic episodes are present. Epworth Sleepiness Score is 18.  2. SOB (shortness of breath) on exertion Gary Grimes notes increasing shortness of breath with exercising and seems to be worsening over time with weight gain. Gary Grimes notes getting out of breath sooner with activity than Gary Grimes used to. This has gotten worse recently. Gary Grimes denies shortness of breath at rest or orthopnea.  3. Type 2 diabetes mellitus with other specified complication, with long-term current use of insulin (HCC) Medications reviewed. Diabetic ROS: no polyuria or polydipsia, no chest pain, dyspnea or TIA's, no numbness, tingling or pain in extremities. Gary Grimes is on Novolog 70/30 SSI 20 units twice daily, Actos 30 mg daily, and Ozempic 1 mg.  Lab Results  Component Value Date   HGBA1C 6.0 (H) 05/29/2019   Lab Results  Component Value Date   CREATININE 1.36 (H) 04/03/2020   4. Hypertension associated with type 2 diabetes mellitus (Gary Grimes) Review: taking medications as instructed, no medication side effects noted, no chest pain on exertion, no dyspnea on exertion, no swelling of ankles.  Gary Grimes is taking olmesartan.  BP Readings from Last 3 Encounters:  08/06/20 124/76  08/01/20 132/64  06/09/20 (!) 152/87   5. Hyperlipidemia associated with type 2 diabetes mellitus (Gary Grimes) Gary Grimes has hyperlipidemia and has been trying to improve his cholesterol levels with intensive lifestyle modification including a low saturated fat diet, exercise and weight loss. Gary Grimes denies any chest pain, claudication or myalgias.  Gary Grimes is taking  atorvastatin.  Lab Results  Component Value Date   ALT 24 04/03/2020   AST 24 04/03/2020   ALKPHOS 82 04/03/2020   BILITOT 0.4 04/03/2020   6. OSA on CPAP Gary Grimes has a diagnosis of sleep apnea. Gary Grimes reports that Gary Grimes is using a CPAP regularly.  Gary Grimes is followed by Pulmonology.  7. Multiple joint pain Gary Grimes endorses pain in his back and neck.  Gary Grimes takes Mobic, Lyrica, and tizanidine.   8. Chronic insomnia Gary Grimes takes trazodone and Ambien for insomnia.  9. Bipolar affective disorder, remission status unspecified ([place]) Gary Grimes is taking Gary Grimes for his bipolar disorder.  Gary Grimes is followed by Gary Grimes.  10. Depression screening Gary Grimes was screened for depression as part of his new patient workup.  PHQ-9 is 23.  11. At risk for heart disease  The 10-year ASCVD risk score Gary Grimes DC Brooke Bonito., Gary al., 2013) is: 16.7%   Values used to calculate the score:     Age: 49 years     Sex: Male     Is Non-Hispanic African American: No     Diabetic: Yes     Tobacco smoker: No     Systolic Blood Pressure: 030 mmHg     Is BP treated: Yes     HDL Cholesterol: 43 mg/dL     Total Cholesterol: 152 mg/dL  Assessment/Plan:   1. Other fatigue Gary Grimes does feel that his weight is causing his energy to be lower than it should be. Fatigue may be related to obesity, depression or many other causes. Labs will be ordered, and in the meanwhile, Khamari will focus on self care including making healthy food choices, increasing physical activity and focusing on stress reduction.  - EKG 12-Lead - Anemia panel - CBC with Differential/Platelet - T4, free - TSH - VITAMIN D 25 Hydroxy (Vit-D Deficiency, Fractures)  2. SOB (shortness of breath) on exertion Gary Grimes does feel that Gary Grimes gets out of breath more easily that Gary Grimes used to when Gary Grimes exercises. Gary Grimes's shortness of breath appears to be obesity related and exercise induced. Gary Grimes has agreed to work on weight loss and gradually increase exercise to treat his exercise induced  shortness of breath. Will continue to monitor closely.  - Comprehensive metabolic panel  3. Type 2 diabetes mellitus with other specified complication, with long-term current use of insulin (HCC) Good blood sugar control is important to decrease the likelihood of diabetic complications such as nephropathy, neuropathy, limb loss, blindness, coronary artery disease, and death. Intensive lifestyle modification including diet, exercise and weight loss are the first line of treatment for diabetes.   - Comprehensive metabolic panel - Hemoglobin A1c - Insulin, random  4. Hypertension associated with type 2 diabetes mellitus ([place]) Gary Grimes is working on healthy weight loss and exercise to improve blood pressure control. We will watch for signs of hypotension as Gary Grimes continues his lifestyle modifications.  - Comprehensive metabolic panel - T4, free - TSH  5. Hyperlipidemia associated with type 2 diabetes mellitus ([place]) Cardiovascular risk and specific lipid/LDL goals reviewed.    Counseling Intensive lifestyle modifications are the first line treatment for this issue. . Dietary changes: Increase soluble fiber. Decrease simple  carbohydrates. . Exercise changes: Moderate to vigorous-intensity aerobic activity 150 minutes per week if tolerated. . Lipid-lowering medications: see documented in medical record.  - Lipid Panel With LDL/HDL Ratio  6. OSA on CPAP Intensive lifestyle modifications are the first line treatment for this issue. We discussed several lifestyle modifications today and Gary Grimes will continue to work on diet, exercise and weight loss efforts. We will continue to monitor. Orders and follow up as documented in patient record.   7. Multiple joint pain Current treatment plan is effective, no change in therapy. Orders and follow up as documented in patient record.  8. Chronic insomnia The problem of recurrent insomnia was discussed. Orders and follow up as documented in patient record.  Counseling: Intensive lifestyle modifications are the first line treatment for this issue.   9. Bipolar affective disorder, remission status unspecified ([place]) Will continue to monitor. This issue directly impacts care plan for optimization of BMI and metabolic health as it impacts the patient's ability to make lifestyle changes.  10. Depression screening Gary Grimes had a positive depression screening. Depression is commonly associated with obesity and often results in emotional eating behaviors. We will monitor this closely and work on CBT to help improve the non-hunger eating patterns. Referral to Psychology may be required if no improvement is seen as Gary Grimes continues in our clinic.  11. At risk for heart disease Gary Grimes was given approximately 15 minutes of coronary artery disease prevention counseling today. Gary Grimes is 59 y.o. male and has risk factors for heart disease including obesity and an elevated ASCVscore. We discussed intensive lifestyle modifications today with an emphasis on specific weight loss instructions and strategies.   12. Class 3 severe obesity with serious comorbidity and body mass index (BMI) of 40.0 to 44.9 in adult, unspecified obesity type Gary Grimes) Gary Grimes is currently in the action stage of change and his goal is to continue with weight loss efforts. I recommend Slayter begin the structured treatment plan as follows:  Gary Grimes has agreed to the Category 3 Plan.  Exercise goals: No exercise has been prescribed at this time.   Behavioral modification strategies: increasing lean protein intake, decreasing simple carbohydrates, increasing vegetables, increasing water intake, decreasing liquid calories, decreasing sodium intake, increasing high fiber foods and decreasing eating out.  Gary Grimes was informed of the importance of frequent follow-up visits to maximize his success with intensive lifestyle modifications for his multiple health conditions. Gary Grimes was informed we would discuss his lab results at  his next visit unless there is a critical issue that needs to be addressed sooner. Delane agreed to keep his next visit at the agreed upon time to discuss these results.  Objective:   Blood pressure 124/76, pulse 84, temperature 98.5 F (36.9 C), temperature source Oral, height 5\' 11"  (1.803 m), weight 297 lb (134.7 kg), SpO2 97 %. Body mass index is 41.42 kg/m.  EKG: Normal sinus rhythm, rate 87 bpm.  Indirect Calorimeter completed today shows a VO2 of 320 and a REE of 2231.  His calculated basal metabolic rate is 9562 thus his basal metabolic rate is worse than expected.  General: Cooperative, alert, well developed, in no acute distress. HEENT: Conjunctivae and lids unremarkable. Cardiovascular: Regular rhythm.  Lungs: Normal work of breathing. Neurologic: No focal deficits.   Lab Results  Component Value Date   CREATININE 1.36 (H) 04/03/2020   BUN 15 04/03/2020   NA 138 04/03/2020   K 4.3 04/03/2020   CL 103 04/03/2020   CO2 24 04/03/2020   Lab  Results  Component Value Date   ALT 24 04/03/2020   AST 24 04/03/2020   ALKPHOS 82 04/03/2020   BILITOT 0.4 04/03/2020   Lab Results  Component Value Date   HGBA1C 6.0 (H) 05/29/2019   Lab Results  Component Value Date   TSH 0.874 01/08/2016   Lab Results  Component Value Date   WBC 6.2 11/03/2019   HGB 13.5 11/03/2019   HCT 40.6 11/03/2019   MCV 98.8 11/03/2019   PLT 245 11/03/2019   Attestation Statements:   This is the patient's first visit at [facility]. The patient's NEW PATIENT PACKET was reviewed at length. Included in the packet: current and past health history, medications, allergies, ROS, gynecologic history (women only), surgical history, family history, social history, weight history, weight loss surgery history (for those that have had weight loss surgery), nutritional evaluation, mood and food questionnaire, PHQ9, Epworth questionnaire, sleep habits questionnaire, patient life and health  improvement goals questionnaire. These will all be scanned into the patient's chart under media.   During the visit, I independently reviewed the patient's EKG, bioimpedance scale results, and indirect calorimeter results. I used this information to tailor a meal plan for the patient that will help him to lose weight and will improve his obesity-related conditions going forward. I performed a medically necessary appropriate examination and/or evaluation. I discussed the assessment and treatment plan with the patient. The patient was provided an opportunity to ask questions and all were answered. The patient agreed with the plan and demonstrated an understanding of the instructions. Labs were ordered at this visit and will be reviewed at the next visit unless more critical results need to be addressed immediately. Clinical information was updated and documented in the EMR.   I, Water quality scientist, CMA, am acting as transcriptionist for Briscoe Deutscher, DO  I have reviewed the above documentation for accuracy and completeness, and I agree with the above. Briscoe Deutscher, DO

## 2020-08-07 ENCOUNTER — Other Ambulatory Visit: Payer: Self-pay | Admitting: Orthopedic Surgery

## 2020-08-07 LAB — LIPID PANEL WITH LDL/HDL RATIO
Cholesterol, Total: 152 mg/dL (ref 100–199)
HDL: 43 mg/dL (ref >39)
LDL Chol Calc (NIH): 67 mg/dL (ref 0–99)
LDL/HDL Ratio: 1.6 ratio (ref 0.0–3.6)
Triglycerides: 263 mg/dL — ABNORMAL HIGH (ref 0–149)
VLDL Cholesterol Cal: 42 mg/dL — ABNORMAL HIGH (ref 5–40)

## 2020-08-07 LAB — COMPREHENSIVE METABOLIC PANEL
ALT: 24 IU/L (ref 0–44)
AST: 19 IU/L (ref 0–40)
Albumin/Globulin Ratio: 1.6 (ref 1.2–2.2)
Albumin: 4.4 g/dL (ref 3.8–4.8)
Alkaline Phosphatase: 82 IU/L (ref 48–121)
BUN/Creatinine Ratio: 15 (ref 10–24)
BUN: 17 mg/dL (ref 8–27)
Bilirubin Total: 0.5 mg/dL (ref 0.0–1.2)
CO2: 21 mmol/L (ref 20–29)
Calcium: 9.8 mg/dL (ref 8.6–10.2)
Chloride: 103 mmol/L (ref 96–106)
Creatinine, Ser: 1.17 mg/dL (ref 0.76–1.27)
GFR calc Af Amer: 77 mL/min/{1.73_m2} (ref >59)
GFR calc non Af Amer: 67 mL/min/{1.73_m2} (ref >59)
Globulin, Total: 2.8 g/dL (ref 1.5–4.5)
Glucose: 176 mg/dL — ABNORMAL HIGH (ref 65–99)
Potassium: 4.8 mmol/L (ref 3.5–5.2)
Sodium: 141 mmol/L (ref 134–144)
Total Protein: 7.2 g/dL (ref 6.0–8.5)

## 2020-08-07 LAB — CBC WITH DIFFERENTIAL/PLATELET
Basophils Absolute: 0.1 10*3/uL (ref 0.0–0.2)
Basos: 1 %
EOS (ABSOLUTE): 0.3 10*3/uL (ref 0.0–0.4)
Eos: 4 %
Hemoglobin: 14.2 g/dL (ref 13.0–17.7)
Immature Grans (Abs): 0.1 10*3/uL (ref 0.0–0.1)
Immature Granulocytes: 1 %
Lymphocytes Absolute: 3.1 10*3/uL (ref 0.7–3.1)
Lymphs: 43 %
MCH: 32.1 pg (ref 26.6–33.0)
MCHC: 34.5 g/dL (ref 31.5–35.7)
MCV: 93 fL (ref 79–97)
Monocytes Absolute: 0.8 10*3/uL (ref 0.1–0.9)
Monocytes: 11 %
Neutrophils Absolute: 2.9 10*3/uL (ref 1.4–7.0)
Neutrophils: 40 %
Platelets: 269 10*3/uL (ref 150–450)
RBC: 4.43 x10E6/uL (ref 4.14–5.80)
RDW: 13.8 % (ref 11.6–15.4)
WBC: 7.2 10*3/uL (ref 3.4–10.8)

## 2020-08-07 LAB — ANEMIA PANEL
Ferritin: 179 ng/mL (ref 30–400)
Folate, Hemolysate: 566 ng/mL
Folate, RBC: 1377 ng/mL (ref >498)
Hematocrit: 41.1 % (ref 37.5–51.0)
Iron Saturation: 35 % (ref 15–55)
Iron: 107 ug/dL (ref 38–169)
Retic Ct Pct: 1.9 % (ref 0.6–2.6)
Total Iron Binding Capacity: 309 ug/dL (ref 250–450)
UIBC: 202 ug/dL (ref 111–343)
Vitamin B-12: 369 pg/mL (ref 232–1245)

## 2020-08-07 LAB — HEMOGLOBIN A1C
Est. average glucose Bld gHb Est-mCnc: 194 mg/dL
Hgb A1c MFr Bld: 8.4 % — ABNORMAL HIGH (ref 4.8–5.6)

## 2020-08-07 LAB — VITAMIN D 25 HYDROXY (VIT D DEFICIENCY, FRACTURES): Vit D, 25-Hydroxy: 28.4 ng/mL — ABNORMAL LOW (ref 30.0–100.0)

## 2020-08-07 LAB — INSULIN, RANDOM: INSULIN: 24.6 u[IU]/mL (ref 2.6–24.9)

## 2020-08-07 LAB — TSH: TSH: 1.45 u[IU]/mL (ref 0.450–4.500)

## 2020-08-07 LAB — T4, FREE: Free T4: 1.18 ng/dL (ref 0.82–1.77)

## 2020-08-08 NOTE — Telephone Encounter (Signed)
Medication refilled at [facility] with Derl Barrow, NP, on 8.5.21. Nothing further needed at this time. Will sign off.

## 2020-08-09 ENCOUNTER — Ambulatory Visit: Payer: No Typology Code available for payment source | Admitting: Orthopedic Surgery

## 2020-08-18 ENCOUNTER — Other Ambulatory Visit: Payer: Self-pay | Admitting: Orthopedic Surgery

## 2020-08-20 ENCOUNTER — Telehealth: Payer: Self-pay | Admitting: Orthopedic Surgery

## 2020-08-20 NOTE — Telephone Encounter (Signed)
Patient repots that he has been having some stabbing pains in the lower back that goes into his right leg. It started last night. He has an appointment with you on 08/29/2020 and wants to know if you recommend anything before his appointment next week.   He has took extra strength Excedrin, Ibuprofen and Mobic, with little or no relief. Do you have any other recommendations? Please advise.

## 2020-08-20 NOTE — Telephone Encounter (Signed)
No not really, back pain not really my specialty, rest heat nsids is as good as anything

## 2020-08-20 NOTE — Telephone Encounter (Signed)
I called the patient and he was advised of the recommendation per Dr. Aline Brochure. He did not have any other questions.

## 2020-08-21 ENCOUNTER — Telehealth: Payer: Self-pay | Admitting: Orthopedic Surgery

## 2020-08-21 NOTE — Telephone Encounter (Signed)
Ok but that's not long enough may take 48 hrs   Advise that Dr Lemmie Evens handles all joints excepts the lower back  May need to see a neurosurgeon and get referral to one from his primary care doctor

## 2020-08-21 NOTE — Telephone Encounter (Signed)
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

## 2020-08-21 NOTE — Telephone Encounter (Signed)
Patient called back to relay that he is not any better with his back pain since following recommendations provided yesterday, 08/20/20; in fact, worse. States also stayed out of work today and said he is in excruciating pain. Please advise.

## 2020-08-27 ENCOUNTER — Other Ambulatory Visit: Payer: Self-pay

## 2020-08-27 ENCOUNTER — Encounter (INDEPENDENT_AMBULATORY_CARE_PROVIDER_SITE_OTHER): Payer: Self-pay | Admitting: Family Medicine

## 2020-08-27 ENCOUNTER — Ambulatory Visit (INDEPENDENT_AMBULATORY_CARE_PROVIDER_SITE_OTHER): Payer: PRIVATE HEALTH INSURANCE | Admitting: Family Medicine

## 2020-08-27 VITALS — BP 146/74 | HR 71 | Temp 98.7°F | Ht 71.0 in | Wt 296.0 lb

## 2020-08-27 DIAGNOSIS — Z794 Long term (current) use of insulin: Secondary | ICD-10-CM

## 2020-08-27 DIAGNOSIS — E559 Vitamin D deficiency, unspecified: Secondary | ICD-10-CM | POA: Diagnosis not present

## 2020-08-27 DIAGNOSIS — E1169 Type 2 diabetes mellitus with other specified complication: Secondary | ICD-10-CM

## 2020-08-27 DIAGNOSIS — E785 Hyperlipidemia, unspecified: Secondary | ICD-10-CM

## 2020-08-27 DIAGNOSIS — Z6841 Body Mass Index (BMI) 40.0 and over, adult: Secondary | ICD-10-CM

## 2020-08-27 NOTE — Progress Notes (Signed)
Chief Complaint:   OBESITY Gary Grimes is here to discuss his progress with his obesity treatment plan along with follow-up of his obesity related diagnoses. Gary Grimes is on the Category 3 Plan and states he is following his eating plan approximately 70% of the time. Gary Grimes states he is walking in the pool.  Today's visit was #: 2 Starting weight: 297 lbs Starting date: 08/06/2020 Today's weight: 296 lbs Today's date: 08/27/2020 Total lbs lost to date: 1 lb Total lbs lost since last in-office visit: 1 lb  Interim History: Gary Grimes says he is interested in bariatric surgery.  We reviewed the process, and he will contact his PCP for a referral. Since our last visit, he has worked on [employer] and moving more.  Assessment/Plan:   1. Type 2 diabetes mellitus with other specified complication, with long-term current use of insulin (HCC) Not at goal. He is taking Novolog 70/30 20 units twice daily.  He is also taking 30 mg daily and Ozempic 1 mg subcu weekly.  He is not taking the Ozempic regularly.  His A1c is 8.4.     2. Vitamin D deficiency Current vitamin D is 28.4, tested on 08/06/2020. Not at goal. Optimal goal > 50 ng/dL. There is also evidence to support a goal of >70 ng/dL in patients with cancer and heart disease. Plan: Continue OTC Vitamin D with follow-up for routine testing of Vitamin D at least 2-3 times per year to avoid over-replacement.  3. Dyslipidemia associated with type 2 diabetes mellitus (HCC) Metabolic dyslipidemia.  Cardiovascular risk and specific lipid/LDL goals reviewed.  We discussed several lifestyle modifications today and Alferd will continue to work on diet, exercise and weight loss efforts.   4. Class 3 severe obesity with serious comorbidity and body mass index (BMI) of 40.0 to 44.9 in adult, unspecified obesity type [facility]) Gary Grimes is currently in the action stage of change. As such, his goal is to continue with weight loss efforts. He has  agreed to the Category 3 Plan.   Exercise goals: As tolerated.  Behavioral modification strategies: increasing lean protein intake and increasing water intake.  Gary Grimes has agreed to follow-up with our clinic as needed. He was informed of the importance of frequent follow-up visits to maximize his success with intensive lifestyle modifications for his multiple health conditions.   Objective:   Blood pressure (!) 146/74, pulse 71, temperature 98.7 F (37.1 C), temperature source Oral, height 5\' 11"  (1.803 m), weight 296 lb (134.3 kg), SpO2 97 %. Body mass index is 41.28 kg/m.  General: Cooperative, alert, well developed, in no acute distress. HEENT: Conjunctivae and lids unremarkable. Cardiovascular: Regular rhythm.  Lungs: Normal work of breathing. Neurologic: No focal deficits.   Lab Results  Component Value Date   CREATININE 1.17 08/06/2020   BUN 17 08/06/2020   NA 141 08/06/2020   K 4.8 08/06/2020   CL 103 08/06/2020   CO2 21 08/06/2020   Lab Results  Component Value Date   ALT 24 08/06/2020   AST 19 08/06/2020   ALKPHOS 82 08/06/2020   BILITOT 0.5 08/06/2020   Lab Results  Component Value Date   HGBA1C 8.4 (H) 08/06/2020   HGBA1C 6.0 (H) 05/29/2019   Lab Results  Component Value Date   INSULIN 24.6 08/06/2020   Lab Results  Component Value Date   TSH 1.450 08/06/2020   Lab Results  Component Value Date   CHOL 152 08/06/2020   HDL 43 08/06/2020   [place]  67 08/06/2020   TRIG 263 (H) 08/06/2020   Lab Results  Component Value Date   WBC 7.2 08/06/2020   HGB 14.2 08/06/2020   HCT 41.1 08/06/2020   MCV 93 08/06/2020   PLT 269 08/06/2020   Lab Results  Component Value Date   IRON 107 08/06/2020   TIBC 309 08/06/2020   FERRITIN 179 08/06/2020   Attestation Statements:   Reviewed by clinician on day of visit: allergies, medications, problem list, medical history, surgical history, family history, social history, and previous encounter  notes.  Time spent on visit including pre-visit chart review and post-visit care and charting was 25 minutes.   I, Water quality scientist, CMA, am acting as transcriptionist for Briscoe Deutscher, DO  I have reviewed the above documentation for accuracy and completeness, and I agree with the above. Briscoe Deutscher, DO

## 2020-08-28 ENCOUNTER — Ambulatory Visit: Attending: Psychiatry | Admitting: Physical Therapy

## 2020-08-28 ENCOUNTER — Encounter: Payer: Self-pay | Admitting: Physical Therapy

## 2020-08-28 ENCOUNTER — Ambulatory Visit: Payer: No Typology Code available for payment source | Admitting: Physical Therapy

## 2020-08-28 DIAGNOSIS — M542 Cervicalgia: Secondary | ICD-10-CM | POA: Diagnosis present

## 2020-08-28 DIAGNOSIS — R252 Cramp and spasm: Secondary | ICD-10-CM | POA: Insufficient documentation

## 2020-08-28 DIAGNOSIS — M6281 Muscle weakness (generalized): Secondary | ICD-10-CM | POA: Diagnosis present

## 2020-08-28 DIAGNOSIS — M5442 Lumbago with sciatica, left side: Secondary | ICD-10-CM | POA: Insufficient documentation

## 2020-08-28 DIAGNOSIS — M5441 Lumbago with sciatica, right side: Secondary | ICD-10-CM | POA: Diagnosis present

## 2020-08-28 NOTE — Therapy (Signed)
[address] [place], [place], [address] Phone: [phone]   Fax:  [phone]  Physical Therapy Evaluation  Patient Details  Name: Gary Grimes MRN: [id number] Date of Birth: 1961-03-28 Referring Provider (PT): Jannifer Franklin   Encounter Date: 08/28/2020   PT End of Session - 08/28/20 1649    Visit Number 1    Date for PT Re-Evaluation 10/28/20    PT Start Time 1400    PT Stop Time 1447    PT Time Calculation (min) 47 min    Activity Tolerance Patient tolerated treatment well    Behavior During Therapy [facility] for tasks assessed/performed           Past Medical History:  Diagnosis Date  . ADHD   . Anginal pain ([place])    ER visit 01/14/2014 visit on chart   . Anxiety    pt denies  . Arthritis   . Bipolar disorder ([place])   . Chronic lower back pain   . Constipation   . Depression    did get seen in er 4/13 for evaluation-psyc situational none recent  . GERD (gastroesophageal reflux disease)   . Headache    none recent  . High cholesterol   . High triglycerides   . Hypertension   . Joint pain   . Levator syndrome 2001   history   . Neuropathy   . OSA on CPAP   . Panic attacks   . Perirectal abscess   . Prediabetes   . S/P ablation of atrial fibrillation   . Type II diabetes mellitus ([place])   . Vitamin D deficiency     Past Surgical History:  Procedure Laterality Date  . ANAL FISSURE REPAIR  08/05/2000   proctoscopy  . APPENDECTOMY  1984  . ATRIAL FIBRILLATION ABLATION  10/28/2018  . ATRIAL FIBRILLATION ABLATION N/A 10/28/2018   Procedure: ATRIAL FIBRILLATION ABLATION;  Surgeon: Constance Haw, MD;  Location: [facility];  Service: Cardiovascular;  Laterality: N/A;  . BIOPSY  05/24/2019   Procedure: BIOPSY;  Surgeon: Ronald Lobo, MD;  [facility];  Service: Endoscopy;;  . BIOPSY  08/10/2019   Procedure: BIOPSY;  Surgeon: Ronald Lobo, MD;  [facility];   Service: Endoscopy;;  . COLONOSCOPY  2011  . COLONOSCOPY WITH PROPOFOL N/A 08/10/2019   Procedure: COLONOSCOPY WITH PROPOFOL;  Surgeon: Ronald Lobo, MD;  [facility];  Service: Endoscopy;  Laterality: N/A;  . ESOPHAGOGASTRODUODENOSCOPY (EGD) WITH PROPOFOL N/A 05/24/2019   Procedure: ESOPHAGOGASTRODUODENOSCOPY (EGD) WITH PROPOFOL;  Surgeon: Ronald Lobo, MD;  [facility];  Service: Endoscopy;  Laterality: N/A;  . GASTROCNEMIUS RECESSION Left 11/03/2019   Procedure: LEFT GASTROCNEMIUS RECESSION;  Surgeon: Newt Minion, MD;  Location: [facility];  Service: Orthopedics;  Laterality: Left;  . HERNIA REPAIR    . INSERTION OF MESH N/A 01/29/2015   Procedure: INSERTION OF MESH;  Surgeon: Excell Seltzer, MD;  Location: [facility];  Service: General;  Laterality: N/A;  . IRRIGATION AND DEBRIDEMENT ABSCESS  02/18/2012   peri-rectal  . LEFT HEART CATH AND CORONARY ANGIOGRAPHY N/A 06/08/2018   Procedure: LEFT HEART CATH AND CORONARY ANGIOGRAPHY;  Surgeon: Leonie Man, MD;  Location: [facility];  Service: Cardiovascular;  Laterality: N/A;  . NASAL SEPTOPLASTY W/ TURBINOPLASTY  05/31/2019  . NASAL SEPTOPLASTY W/ TURBINOPLASTY Bilateral 05/31/2019   Procedure: NASAL SEPTOPLASTY WITH BILATERAL TURBINATE REDUCTION;  Surgeon: Leta Baptist, MD;  Location: [facility];  Service: ENT;  Laterality: Bilateral;  . PLANTAR FASCIA RELEASE Left 11/03/2019   Procedure: PLANTAR FASCIA RELEASE LEFT FOOT;  Surgeon: Newt Minion, MD;  Location: [facility];  Service: Orthopedics;  Laterality: Left;  . POLYPECTOMY  08/10/2019   Procedure: POLYPECTOMY;  Surgeon: Ronald Lobo, MD;  [facility];  Service: Endoscopy;;  . SHOULDER ARTHROSCOPY Left ?2009   "repaired  AC joint; reattached bicept tendon"  . SHOULDER ARTHROSCOPY W/ LABRAL REPAIR Left 08/08/2007  . UMBILICAL HERNIA REPAIR  10/27/2010  . VENTRAL HERNIA REPAIR N/A 01/29/2015   Procedure: LAPAROSCOPIC VENTRAL HERNIA;  Surgeon: Excell Seltzer, MD;  [facility];  Service: General;  Laterality: N/A;    There were no vitals filed for this visit.    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 Pt reports LB is primary complaint but neck is still hurting. Pt reports N/T in B legs with radiating pain. Pt reports that he is out of work right now and will try to go back next Tuesday,9/7. Work requirements include driving upwards of 2-3 hours at one time, lifting up to 25 lbs, and walking long distances.    Diagnostic tests no imaging since incident    Currently in Pain? Yes    Pain Score 10-Worst pain ever    Pain Location Back    Pain Orientation Lower    Pain Descriptors / Indicators Constant;Stabbing;Burning    Pain Type Acute pain    Pain Radiating Towards BLE    Pain Onset 1 to 4 weeks ago    Pain Frequency Constant    Aggravating Factors  walking, standing, driving    Pain Relieving Factors rest, heat    Multiple Pain Sites Yes    Pain Score 7    Pain Location Neck    Pain Orientation Mid    Pain Descriptors / Indicators Aching;Burning;Sharp    Pain Type Chronic pain    Pain Onset More than a month ago    Pain Frequency Intermittent    Aggravating Factors  looking up, turning head side to side, sleeping wrong way    Pain Relieving Factors rest, heat              [facility] PT Assessment - 08/28/20 0001      Assessment   Medical Diagnosis Neck Pain and LBP with radiating pain    Referring Provider (PT) Jannifer Franklin    Onset Date/Surgical Date 08/20/20    Hand Dominance Right      Precautions   Precautions None      Restrictions   Weight Bearing Restrictions No      Balance Screen   Has the patient fallen in the past 6 months No    Has the patient had a decrease in activity level because of a fear of falling?  No    Is the patient reluctant to leave their home because of a  fear of falling?  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    Leisure walking      Sensation  Light Touch Appears Intact      Posture/Postural Control   Posture/Postural Control Postural limitations    Postural Limitations Rounded Shoulders;Forward head;Decreased lumbar lordosis      ROM / Strength   AROM / PROM / Strength AROM;Strength      AROM   Overall AROM Comments lumbar ROM 50% limited and painful in all directions; lumbar extension reproduces radiating pain in BLE    AROM Assessment Site Cervical    Cervical Flexion 45    Cervical Extension 25    Cervical - Right Side Bend 25    Cervical - Left Side Bend 25    Cervical - Right Rotation 35    Cervical - Left Rotation 40      Strength   Overall Strength Comments BLE 4+/5      Flexibility   Soft Tissue Assessment /Muscle Length yes    Hamstrings very tight    Quadriceps tight    Piriformis very tight L>R      Palpation   Palpation comment tender to palpation B lumbar paraspinals and L glute med/piriformis      Transfers   Five time sit to stand comments  <15 sec; fatigues quickly      Ambulation/Gait   Gait Comments widened BOS                      Objective measurements completed on examination: See above findings.       [facility] Adult PT Treatment/Exercise - 08/28/20 0001      Exercises   Exercises Lumbar;Neck      Lumbar Exercises: Stretches   Active Hamstring Stretch Right;Left;1 rep;30 seconds    Lower Trunk Rotation 5 reps;10 seconds      Modalities   Modalities Electrical Stimulation;Moist Heat      Moist Heat Therapy   Number Minutes Moist Heat 10 Minutes    Moist Heat Location Cervical;Lumbar Spine      Electrical Stimulation    Electrical Stimulation Location 10    Electrical Stimulation Action IFC    Electrical Stimulation Parameters supine    Electrical Stimulation Goals Pain      Neck Exercises: Stretches   Upper Trapezius Stretch Right;Left;1 rep;30 seconds    Levator Stretch Right;Left;1 rep;30 seconds                  PT Education - 08/28/20 1649    Education Details Pt educated on HEP and POC    Person(s) Educated Patient    Methods Explanation;Demonstration;Handout    Comprehension Verbalized understanding;Returned demonstration            PT Short Term Goals - 08/28/20 1655      PT SHORT TERM GOAL #1   Title Pt will be independent with HEP    Time 2    Period Weeks    Status New    Target Date 09/11/20             PT Long Term Goals - 08/28/20 1655      PT LONG TERM GOAL #1   Title Pt will demo ability to lift 25# on floor and carry across gym/place on table in order to return to full work duties    Time 6    Period Weeks    Status New    Target Date 10/09/20      PT LONG TERM GOAL #2   Title Pt will report resolution of radiating pain in BLE  Time 6    Period Weeks    Status New    Target Date 10/09/20      PT LONG TERM GOAL #3   Title Pt will demo lumbar extension <25% limited with no increase in LBP    Time 6    Period Weeks    Status New    Target Date 10/09/20      PT LONG TERM GOAL #4   Title Pt will demo cervical ROM WFL with no increased pain    Time 6    Period Weeks    Status New    Target Date 10/09/20                  Plan - 08/28/20 1650    Clinical Impression Statement Pt presents to clinic with reports of LBP and cervical pain present since lifting heavy box at work ~2 weeks ago. Pt has hx of DDD of cervical spine and intermittent episodes of LBP throughout the years. Pt demos very tight hip IR B, decreased LE flexibility, core weakness/instability, and decreased lumbar ROM. Pt reports radiating pain and N/T in BLE which is  increased with lumbar extension. Pt would benefit from skilled PT to address the above impairments.    Personal Factors and Comorbidities Profession    Examination-Activity Limitations Carry;Lift    Examination-Participation Restrictions Community Activity;Interpersonal Relationship    Stability/Clinical Decision Making Evolving/Moderate complexity    Clinical Decision Making Low    Rehab Potential Good    PT Frequency 2x / week    PT Duration 6 weeks    PT Treatment/Interventions ADLs/Self Care Home Management;Electrical Stimulation;Iontophoresis 4mg /ml Dexamethasone;Moist Heat;Traction;Neuromuscular re-education;Therapeutic exercise;Therapeutic activities;Patient/family education;Manual techniques;Dry needling;Passive range of motion;Taping    PT Next Visit Plan LE flexibility/strength, traction if indicated, cervical ex's    PT Home Exercise Plan LTR, hamstring stretch, UT stretch, levator stretch    Consulted and Agree with Plan of Care Patient           Patient will benefit from skilled therapeutic intervention in order to improve the following deficits and impairments:  Abnormal gait, Decreased range of motion, Difficulty walking, Decreased endurance, Increased muscle spasms, Decreased activity tolerance, Pain, Hypomobility, Impaired flexibility, Decreased strength, Postural dysfunction  Visit Diagnosis: Acute bilateral low back pain with bilateral sciatica  Muscle weakness (generalized)  Cervicalgia  Cramp and spasm     Problem List Patient Active Problem List   Diagnosis Date Noted  . Chronic insomnia 08/01/2020  . DOE (dyspnea on exertion) 11/29/2019  . Achilles tendon contracture, left   . Acquired equinus deformity of both feet 10/22/2019  . S/P nasal septoplasty 05/31/2019  . Preoperative cardiovascular examination 05/17/2019  . Mild CAD 05/17/2019  . Chronic pansinusitis 04/28/2019  . Plantar fasciitis of left foot 02/28/2019  . Type II diabetes mellitus with  neurological manifestations ([place]) 02/28/2019  . S/P ablation of atrial fibrillation   . Paroxysmal atrial fibrillation ([place]) 06/06/2018  . Essential hypertension 06/06/2018  . Diabetes mellitus due to underlying condition with unspecified complications ([place]) 16/09/9603  . Sleep apnea 06/06/2018  . Overweight 06/06/2018  . Bipolar disorder, manic ([place]) 05/29/2016  . MDD (major depressive disorder), recurrent severe, without psychosis ([place]) 12/27/2015  . Ventral incisional hernia 01/29/2015  . Perirectal abscess 02/18/2012  . SPONDYLOSIS 09/19/2008   Amador Cunas, PT, DPT [facility] Daleigh Pollinger 08/28/2020, 5:02 PM  [place], [place], [address] Phone: [phone]  Fax:  [phone]  Name: KAREM TOMASO MRN: [id number] Date of Birth: [birth year]

## 2020-08-29 ENCOUNTER — Ambulatory Visit (INDEPENDENT_AMBULATORY_CARE_PROVIDER_SITE_OTHER): Payer: PRIVATE HEALTH INSURANCE | Admitting: Family Medicine

## 2020-08-29 ENCOUNTER — Encounter: Payer: Self-pay | Admitting: Orthopedic Surgery

## 2020-08-29 ENCOUNTER — Ambulatory Visit (INDEPENDENT_AMBULATORY_CARE_PROVIDER_SITE_OTHER): Payer: No Typology Code available for payment source | Admitting: Orthopedic Surgery

## 2020-08-29 ENCOUNTER — Other Ambulatory Visit: Payer: Self-pay

## 2020-08-29 VITALS — BP 149/85 | HR 86 | Ht 71.0 in | Wt 299.0 lb

## 2020-08-29 DIAGNOSIS — M5442 Lumbago with sciatica, left side: Secondary | ICD-10-CM

## 2020-08-29 DIAGNOSIS — M5441 Lumbago with sciatica, right side: Secondary | ICD-10-CM

## 2020-08-29 DIAGNOSIS — Z6841 Body Mass Index (BMI) 40.0 and over, adult: Secondary | ICD-10-CM | POA: Diagnosis not present

## 2020-08-29 MED ORDER — PREDNISONE 10 MG (48) PO TBPK: ORAL_TABLET | Freq: Every day | ORAL | 0 refills | Status: DC

## 2020-08-29 MED ORDER — HYDROCODONE-ACETAMINOPHEN 5-325 MG PO TABS: 1.0000 | ORAL_TABLET | Freq: Four times a day (QID) | ORAL | 0 refills | Status: DC | PRN

## 2020-08-29 MED ORDER — METHOCARBAMOL 750 MG PO TABS
750.0000 mg | ORAL_TABLET | Freq: Four times a day (QID) | ORAL | 2 refills | Status: DC
Start: 2020-08-29 — End: 2020-10-14

## 2020-08-29 NOTE — Patient Instructions (Addendum)
Heating pad as needed  Stop tizanidine start robaxin 750   Dose pack double strength  5 days of norco 1 time rx  Stop one of the sedatives

## 2020-08-29 NOTE — Progress Notes (Signed)
Chief Complaint  Patient presents with  . Back Pain    Would like a second opinion on back/neck   And is received an injection in his left shoulder for possible cuff problem status post reconstructive surgery on his left shoulder by Dr. Veverly Fells in [place] many years back  However he picked up a box at work on 25 August and felt acute pain in his lower back and presents with continued pain which he says is severe he would like some pain medication as well as numbness and tingling in both legs  Bowel and bladder function remain intact  Past Medical History:  Diagnosis Date  . ADHD   . Anginal pain ([place])    ER visit 01/14/2014 visit on chart   . Anxiety    pt denies  . Arthritis   . Bipolar disorder ([place])   . Chronic lower back pain   . Constipation   . Depression    did get seen in er 4/13 for evaluation-psyc situational none recent  . GERD (gastroesophageal reflux disease)   . Headache    none recent  . High cholesterol   . High triglycerides   . Hypertension   . Joint pain   . Levator syndrome 2001   history   . Neuropathy   . OSA on CPAP   . Panic attacks   . Perirectal abscess   . Prediabetes   . S/P ablation of atrial fibrillation   . Type II diabetes mellitus ([place])   . Vitamin D deficiency     He notes that he lost his wife 16 years ago last week so that is also been stressful for him  BP (!) 149/85   Pulse 86   Ht 5\' 11"  (1.803 m)   Wt 299 lb (135.6 kg)   BMI 41.70 kg/m   The patient meets the AMA guidelines for Morbid (severe) obesity with a BMI > 40.0 and I have recommended weight loss.  He is awake and alert he is oriented x3 mood is pleasant affect is normal he is ambulatory without assistive device  He has normal strength in both lower extremities and normal sensation in all nerve roots and dermatomes he has pain with extension of both knees with tension and stretching sensation in his back  Pain is been less than 6 weeks there are no red flags no  x-rays were taken and it was a work-related injury although he is seeing [name] outside of Worker's Comp.  He does understand that we do not treat back problem especially work-related  We changed some of his medications around he stopped his Eliquis  Meds ordered this encounter  Medications  . methocarbamol (ROBAXIN) 750 MG tablet    Sig: Take 1 tablet (750 mg total) by mouth 4 (four) times daily.    Dispense:  60 tablet    Refill:  2  . predniSONE (STERAPRED UNI-PAK 48 TAB) 10 MG (48) TBPK tablet    Sig: Take by mouth daily.    Dispense:  48 tablet    Refill:  0  . HYDROcodone-acetaminophen (NORCO/VICODIN) 5-325 MG tablet    Sig: Take 1 tablet by mouth every 6 (six) hours as needed for moderate pain.    Dispense:  30 tablet    Refill:  0    He is advised to strongly seek Worker's Comp. evaluation through a doctor who deals with these types of issues  Encounter Diagnoses  Name Primary?  . Body mass index 40.0-44.9, adult (  [place]) Yes  . Morbid obesity ([place])   . Acute bilateral low back pain with bilateral sciatica       59 year old male with previous bilateral shoulder surgery presents with a history of acute onset of cervical spine pain requiring him to go to the emergency room where he had a CAT scan which showed multilevels of degenerative disc disease with no acute disc herniation.  He was treated with Percocet and muscle relaxers and eventually saw neurosurgery who put him on prednisone.   The prednisone shot his sugar up to 500 as he is a diabetic.  Complicating factors is that he is also on Eliquis for prior atrial fibrillation although he has had an ablation.  He says the cardiologist thinks he is at risk for stroke and would like to keep him on the Eliquis.   He now complains of mild pain in his cervical spine pain and catching left shoulder.  Deltoid and acromial region with painful range of motion in a catch when abducting the shoulder   He is very concerned about his  overall arthritis especially in his neck and possibly in his shoulder and that he cannot take anti-inflammatories and he is averse to trying opioids for pain relief   He also wants 2nd opinion ref back and neck   Meds ordered this encounter  Medications  . methocarbamol (ROBAXIN) 750 MG tablet    Sig: Take 1 tablet (750 mg total) by mouth 4 (four) times daily.    Dispense:  60 tablet    Refill:  2  . predniSONE (STERAPRED UNI-PAK 48 TAB) 10 MG (48) TBPK tablet    Sig: Take by mouth daily.    Dispense:  48 tablet    Refill:  0  . HYDROcodone-acetaminophen (NORCO/VICODIN) 5-325 MG tablet    Sig: Take 1 tablet by mouth every 6 (six) hours as needed for moderate pain.    Dispense:  30 tablet    Refill:  0

## 2020-08-30 ENCOUNTER — Ambulatory Visit: Payer: No Typology Code available for payment source | Admitting: Registered"

## 2020-08-30 ENCOUNTER — Encounter: Payer: Self-pay | Admitting: Physical Therapy

## 2020-08-30 ENCOUNTER — Ambulatory Visit: Attending: Family Medicine | Admitting: Physical Therapy

## 2020-08-30 DIAGNOSIS — M5442 Lumbago with sciatica, left side: Secondary | ICD-10-CM | POA: Diagnosis present

## 2020-08-30 DIAGNOSIS — M542 Cervicalgia: Secondary | ICD-10-CM | POA: Diagnosis present

## 2020-08-30 DIAGNOSIS — R252 Cramp and spasm: Secondary | ICD-10-CM | POA: Diagnosis present

## 2020-08-30 DIAGNOSIS — M5441 Lumbago with sciatica, right side: Secondary | ICD-10-CM | POA: Diagnosis present

## 2020-08-30 DIAGNOSIS — M6281 Muscle weakness (generalized): Secondary | ICD-10-CM | POA: Diagnosis present

## 2020-08-30 NOTE — Therapy (Signed)
[place], [place], [address] Phone: [phone]   Fax:  [phone]  Physical Therapy Treatment  Patient Details  Name: Gary Grimes MRN: [id number] Date of Birth: [birth year] Referring Provider (PT): Jannifer Franklin   Encounter Date: 08/30/2020   PT End of Session - 08/30/20 0927    Visit Number 2    Date for PT Re-Evaluation 10/28/20    PT Start Time 0845    PT Stop Time 0939    PT Time Calculation (min) 54 min    Activity Tolerance Patient tolerated treatment well    Behavior During Therapy [facility] for tasks assessed/performed           Past Medical History:  Diagnosis Date  . ADHD   . Anginal pain ([place])    ER visit 01/14/2014 visit on chart   . Anxiety    pt denies  . Arthritis   . Bipolar disorder ([place])   . Chronic lower back pain   . Constipation   . Depression    did get seen in er 4/13 for evaluation-psyc situational none recent  . GERD (gastroesophageal reflux disease)   . Headache    none recent  . High cholesterol   . High triglycerides   . Hypertension   . Joint pain   . Levator syndrome 2001   history   . Neuropathy   . OSA on CPAP   . Panic attacks   . Perirectal abscess   . Prediabetes   . S/P ablation of atrial fibrillation   . Type II diabetes mellitus ([place])   . Vitamin D deficiency     Past Surgical History:  Procedure Laterality Date  . ANAL FISSURE REPAIR  08/05/2000   proctoscopy  . APPENDECTOMY  1984  . ATRIAL FIBRILLATION ABLATION  10/28/2018  . ATRIAL FIBRILLATION ABLATION N/A 10/28/2018   Procedure: ATRIAL FIBRILLATION ABLATION;  Surgeon: Constance Haw, MD;  Location: [facility];  Service: Cardiovascular;  Laterality: N/A;  . BIOPSY  05/24/2019   Procedure: BIOPSY;  Surgeon:  Lobo, MD;  [facility];  Service: Endoscopy;;  . BIOPSY  08/10/2019   Procedure: BIOPSY;  Surgeon:  Lobo, MD;  [facility];   Service: Endoscopy;;  . COLONOSCOPY  2011  . COLONOSCOPY WITH PROPOFOL N/A 08/10/2019   Procedure: COLONOSCOPY WITH PROPOFOL;  Surgeon:  Lobo, MD;  [facility];  Service: Endoscopy;  Laterality: N/A;  . ESOPHAGOGASTRODUODENOSCOPY (EGD) WITH PROPOFOL N/A 05/24/2019   Procedure: ESOPHAGOGASTRODUODENOSCOPY (EGD) WITH PROPOFOL;  Surgeon:  Lobo, MD;  [facility];  Service: Endoscopy;  Laterality: N/A;  . GASTROCNEMIUS RECESSION Left 11/03/2019   Procedure: LEFT GASTROCNEMIUS RECESSION;  Surgeon: Newt Minion, MD;  Location: [facility];  Service: Orthopedics;  Laterality: Left;  . HERNIA REPAIR    . INSERTION OF MESH N/A 01/29/2015   Procedure: INSERTION OF MESH;  Surgeon: Excell Seltzer, MD;  [facility];  Service: General;  Laterality: N/A;  . IRRIGATION AND DEBRIDEMENT ABSCESS  02/18/2012   peri-rectal  . LEFT HEART CATH AND CORONARY ANGIOGRAPHY N/A 06/08/2018   Procedure: LEFT HEART CATH AND CORONARY ANGIOGRAPHY;  Surgeon: Leonie Man, MD;  Location: [facility];  Service: Cardiovascular;  Laterality: N/A;  . NASAL SEPTOPLASTY W/ TURBINOPLASTY  05/31/2019  . NASAL SEPTOPLASTY W/ TURBINOPLASTY Bilateral 05/31/2019   Procedure: NASAL SEPTOPLASTY WITH BILATERAL TURBINATE REDUCTION;  Surgeon: Leta Baptist, MD;  Location: [facility] [place];  Service: ENT;  Laterality: Bilateral;  . PLANTAR FASCIA RELEASE Left 11/03/2019   Procedure: PLANTAR FASCIA RELEASE LEFT FOOT;  Surgeon: Newt Minion, MD;  Location: [facility];  Service: Orthopedics;  Laterality: Left;  . POLYPECTOMY  08/10/2019   Procedure: POLYPECTOMY;  Surgeon:  Lobo, MD;  [facility];  Service: Endoscopy;;  . SHOULDER ARTHROSCOPY Left ?2009   "repaired  AC joint; reattached bicept tendon"  . SHOULDER ARTHROSCOPY W/ LABRAL REPAIR Left 08/08/2007  . UMBILICAL HERNIA REPAIR  10/27/2010  . VENTRAL HERNIA REPAIR N/A 01/29/2015   Procedure: LAPAROSCOPIC VENTRAL HERNIA;  Surgeon: Excell Seltzer, MD;  [facility];  Service: General;  Laterality: N/A;    There were no vitals filed for this visit.   Subjective Assessment - 08/30/20 0847    Subjective New med's has him nauseous. Low back discomfort    Currently in Pain? No/denies                             [facility] Adult PT Treatment/Exercise - 08/30/20 0001      Neck Exercises: Machines for Strengthening   UBE (Upper Arm Bike) L2 x 2 min each way      Lumbar Exercises: Stretches   Passive Hamstring Stretch Left;Right;20 seconds;5 reps    Piriformis Stretch Right;Left;4 reps;10 seconds      Lumbar Exercises: Aerobic   Nustep L4 x 4 min       Lumbar Exercises: Machines for Strengthening   Cybex Knee Extension 10lb 2x10     Cybex Knee Flexion 25lb 2x10      Lumbar Exercises: Standing   Row Strengthening;Both;15 reps;Theraband   x2   Theraband Level (Row) Level 3 (Green)    Shoulder Extension Strengthening;Both;20 reps    Shoulder Extension Limitations 10      Lumbar Exercises: Supine   Bridge Compliant;20 reps;2 seconds      Modalities   Modalities Electrical Stimulation;Moist Heat      Moist Heat Therapy   Number Minutes Moist Heat 12 Minutes    Moist Heat Location Cervical;Lumbar Spine      Electrical Stimulation   Electrical Stimulation Location Low back    Electrical Stimulation Action IFC    Electrical Stimulation Parameters supine    Electrical Stimulation Goals Pain                    PT Short Term Goals - 08/28/20 1655      PT SHORT TERM GOAL #1   Title Pt will be independent with HEP    Time 2    Period Weeks    Status New    Target Date 09/11/20             PT Long Term Goals - 08/28/20 1655      PT LONG TERM GOAL #1   Title Pt will demo ability to lift 25# on floor and carry across gym/place on table in order to return to full work duties    Time 6    Period Weeks    Status New    Target Date 10/09/20      PT LONG TERM GOAL #2   Title Pt  will report resolution of radiating pain in BLE    Time 6    Period Weeks    Status New    Target Date 10/09/20      PT LONG TERM GOAL #3   Title Pt  will demo lumbar extension <25% limited with no increase in LBP    Time 6    Period Weeks    Status New    Target Date 10/09/20      PT LONG TERM GOAL #4   Title Pt will demo cervical ROM WFL with no increased pain    Time 6    Period Weeks    Status New    Target Date 10/09/20                 Plan - 08/30/20 0940    Clinical Impression Statement Some fatigue noted with today's activities. Cues needed to complete full ROM with seated HS curls and extensions. Postural cues required with rows and extensions. Core weakness present with supine bridges. Bilateral HS tightness noted with stretches.    Personal Factors and Comorbidities Profession    Examination-Activity Limitations Carry;Lift    Examination-Participation Restrictions Community Activity;Interpersonal Relationship    Stability/Clinical Decision Making Evolving/Moderate complexity    Rehab Potential Good    PT Frequency 2x / week    PT Duration 6 weeks    PT Treatment/Interventions ADLs/Self Care Home Management;Electrical Stimulation;Iontophoresis 4mg /ml Dexamethasone;Moist Heat;Traction;Neuromuscular re-education;Therapeutic exercise;Therapeutic activities;Patient/family education;Manual techniques;Dry needling;Passive range of motion;Taping    PT Next Visit Plan LE flexibility/strength, traction if indicated, cervical ex's           Patient will benefit from skilled therapeutic intervention in order to improve the following deficits and impairments:  Abnormal gait, Decreased range of motion, Difficulty walking, Decreased endurance, Increased muscle spasms, Decreased activity tolerance, Pain, Hypomobility, Impaired flexibility, Decreased strength, Postural dysfunction  Visit Diagnosis: Muscle weakness (generalized)  Cervicalgia  Cramp and spasm  Acute  bilateral low back pain with bilateral sciatica     Problem List Patient Active Problem List   Diagnosis Date Noted  . Chronic insomnia 08/01/2020  . DOE (dyspnea on exertion) 11/29/2019  . Achilles tendon contracture, left   . Acquired equinus deformity of both feet 10/22/2019  . S/P nasal septoplasty 05/31/2019  . Preoperative cardiovascular examination 05/17/2019  . Mild CAD 05/17/2019  . Chronic pansinusitis 04/28/2019  . Plantar fasciitis of left foot 02/28/2019  . Type II diabetes mellitus with neurological manifestations ([place]) 02/28/2019  . S/P ablation of atrial fibrillation   . Paroxysmal atrial fibrillation ([place]) 06/06/2018  . Essential hypertension 06/06/2018  . Diabetes mellitus due to underlying condition with unspecified complications ([place]) 48/25/0037  . Sleep apnea 06/06/2018  . Overweight 06/06/2018  . Bipolar disorder, manic ([place]) 05/29/2016  . MDD (major depressive disorder), recurrent severe, without psychosis ([place]) 12/27/2015  . Ventral incisional hernia 01/29/2015  . Perirectal abscess 02/18/2012  . SPONDYLOSIS 09/19/2008    Scot Jun, PTA 08/30/2020, 9:42 AM  [place], [place], [address] Phone: [phone]   Fax:  [phone]  Name: JIYAAN STEINHAUSER MRN: [id number] Date of Birth: September 28, 1961

## 2020-09-01 ENCOUNTER — Other Ambulatory Visit: Payer: Self-pay | Admitting: Orthopedic Surgery

## 2020-09-03 ENCOUNTER — Ambulatory Visit: Admitting: Physical Therapy

## 2020-09-03 ENCOUNTER — Other Ambulatory Visit: Payer: Self-pay

## 2020-09-03 ENCOUNTER — Encounter: Payer: Self-pay | Admitting: Physical Therapy

## 2020-09-03 DIAGNOSIS — M6281 Muscle weakness (generalized): Secondary | ICD-10-CM

## 2020-09-03 DIAGNOSIS — M542 Cervicalgia: Secondary | ICD-10-CM

## 2020-09-03 DIAGNOSIS — R252 Cramp and spasm: Secondary | ICD-10-CM

## 2020-09-03 NOTE — Therapy (Signed)
[place], [place], [address] Phone: [phone]   Fax:  [phone]  Physical Therapy Treatment  Patient Details  Name: Gary Grimes MRN: [id number] Date of Birth: 27-Dec-1961 Referring Provider (PT): Jannifer Franklin   Encounter Date: 09/03/2020   PT End of Session - 09/03/20 1344    Visit Number 3    Date for PT Re-Evaluation 10/28/20    PT Start Time 1300    PT Stop Time 0160    PT Time Calculation (min) 55 min    Activity Tolerance Patient tolerated treatment well    Behavior During Therapy [facility] for tasks assessed/performed           Past Medical History:  Diagnosis Date  . ADHD   . Anginal pain ([place])    ER visit 01/14/2014 visit on chart   . Anxiety    pt denies  . Arthritis   . Bipolar disorder ([place])   . Chronic lower back pain   . Constipation   . Depression    did get seen in er 4/13 for evaluation-psyc situational none recent  . GERD (gastroesophageal reflux disease)   . Headache    none recent  . High cholesterol   . High triglycerides   . Hypertension   . Joint pain   . Levator syndrome 2001   history   . Neuropathy   . OSA on CPAP   . Panic attacks   . Perirectal abscess   . Prediabetes   . S/P ablation of atrial fibrillation   . Type II diabetes mellitus ([place])   . Vitamin D deficiency     Past Surgical History:  Procedure Laterality Date  . ANAL FISSURE REPAIR  08/05/2000   proctoscopy  . APPENDECTOMY  1984  . ATRIAL FIBRILLATION ABLATION  10/28/2018  . ATRIAL FIBRILLATION ABLATION N/A 10/28/2018   Procedure: ATRIAL FIBRILLATION ABLATION;  Surgeon: Constance Haw, MD;  Location: [facility];  Service: Cardiovascular;  Laterality: N/A;  . BIOPSY  05/24/2019   Procedure: BIOPSY;  Surgeon:  Lobo, MD;  [facility];  Service: Endoscopy;;  . BIOPSY  08/10/2019   Procedure: BIOPSY;  Surgeon:  Lobo, MD;  [facility];   Service: Endoscopy;;  . COLONOSCOPY  2011  . COLONOSCOPY WITH PROPOFOL N/A 08/10/2019   Procedure: COLONOSCOPY WITH PROPOFOL;  Surgeon:  Lobo, MD;  [facility];  Service: Endoscopy;  Laterality: N/A;  . ESOPHAGOGASTRODUODENOSCOPY (EGD) WITH PROPOFOL N/A 05/24/2019   Procedure: ESOPHAGOGASTRODUODENOSCOPY (EGD) WITH PROPOFOL;  Surgeon:  Lobo, MD;  [facility];  Service: Endoscopy;  Laterality: N/A;  . GASTROCNEMIUS RECESSION Left 11/03/2019   Procedure: LEFT GASTROCNEMIUS RECESSION;  Surgeon: Newt Minion, MD;  Location: [facility];  Service: Orthopedics;  Laterality: Left;  . HERNIA REPAIR    . INSERTION OF MESH N/A 01/29/2015   Procedure: INSERTION OF MESH;  Surgeon: Excell Seltzer, MD;  [facility];  Service: General;  Laterality: N/A;  . IRRIGATION AND DEBRIDEMENT ABSCESS  02/18/2012   peri-rectal  . LEFT HEART CATH AND CORONARY ANGIOGRAPHY N/A 06/08/2018   Procedure: LEFT HEART CATH AND CORONARY ANGIOGRAPHY;  Surgeon: Leonie Man, MD;  Location: [facility];  Service: Cardiovascular;  Laterality: N/A;  . NASAL SEPTOPLASTY W/ TURBINOPLASTY  05/31/2019  . NASAL SEPTOPLASTY W/ TURBINOPLASTY Bilateral 05/31/2019   Procedure: NASAL SEPTOPLASTY WITH BILATERAL TURBINATE REDUCTION;  Surgeon: Leta Baptist, MD;  Location: [facility];  Service: ENT;  Laterality: Bilateral;  . PLANTAR FASCIA RELEASE Left 11/03/2019   Procedure: PLANTAR FASCIA RELEASE LEFT FOOT;  Surgeon: Newt Minion, MD;  Location: [facility];  Service: Orthopedics;  Laterality: Left;  . POLYPECTOMY  08/10/2019   Procedure: POLYPECTOMY;  Surgeon:  Lobo, MD;  [facility];  Service: Endoscopy;;  . SHOULDER ARTHROSCOPY Left ?2009   "repaired  AC joint; reattached bicept tendon"  . SHOULDER ARTHROSCOPY W/ LABRAL REPAIR Left 08/08/2007  . UMBILICAL HERNIA REPAIR  10/27/2010  . VENTRAL HERNIA REPAIR N/A 01/29/2015   Procedure: LAPAROSCOPIC VENTRAL HERNIA;  Surgeon: Excell Seltzer, MD;  [facility];  Service: General;  Laterality: N/A;    There were no vitals filed for this visit.   Subjective Assessment - 09/03/20 1303    Subjective About the same, neck and back pain    Currently in Pain? Yes    Pain Score 9     Pain Location --   neck and back                            [facility] Adult PT Treatment/Exercise - 09/03/20 0001      Neck Exercises: Machines for Strengthening   Cybex Row 25lb 2x10    Lat Pull 25lb 2x10       Neck Exercises: Standing   Neck Retraction 20 reps;3 secs    Other Standing Exercises Shoulder ER red 2x10    Other Standing Exercises Shrugs 2x10 with levator stretch       Lumbar Exercises: Aerobic   Nustep L5 x 6 min       Lumbar Exercises: Machines for Strengthening   Cybex Lumbar Extension black band 2x10     Cybex Knee Extension 10lb 2x10     Cybex Knee Flexion 25lb 2x10      Lumbar Exercises: Standing   Shoulder Extension Strengthening;Both;20 reps    Shoulder Extension Limitations 10      Lumbar Exercises: Seated   Sit to Stand 10 reps   x2 holding yellow ball     Lumbar Exercises: Supine   Ab Set 10 reps;2 seconds    Bridge Compliant;1 second;15 reps    Other Supine Lumbar Exercises LE on [place] bridges,K2c oblq      Modalities   Modalities Electrical Stimulation;Moist Heat      Moist Heat Therapy   Number Minutes Moist Heat 12 Minutes    Moist Heat Location Cervical;Lumbar Spine      Electrical Stimulation   Electrical Stimulation Location cervical & lumbar spine    Electrical Stimulation Action preMod    Electrical Stimulation Parameters supine    Electrical Stimulation Goals Pain                    PT Short Term Goals - 08/28/20 1655      PT SHORT TERM GOAL #1   Title Pt will be independent with HEP    Time 2    Period Weeks    Status New    Target Date 09/11/20             PT Long Term Goals - 08/28/20 1655      PT LONG TERM GOAL #1   Title Pt will  demo ability to lift 25# on floor and carry across gym/place on table in order to return to full work duties    Time 6    Period Weeks  Status New    Target Date 10/09/20      PT LONG TERM GOAL #2   Title Pt will report resolution of radiating pain in BLE    Time 6    Period Weeks    Status New    Target Date 10/09/20      PT LONG TERM GOAL #3   Title Pt will demo lumbar extension <25% limited with no increase in LBP    Time 6    Period Weeks    Status New    Target Date 10/09/20      PT LONG TERM GOAL #4   Title Pt will demo cervical ROM WFL with no increased pain    Time 6    Period Weeks    Status New    Target Date 10/09/20                 Plan - 09/03/20 1345    Clinical Impression Statement Increase neck and back pain today Core weakness present with supine interventions. Progressed to some machine level postural strengthening interventions without issue. Some low back soreness reported with seated lumbar extensions. Cues needed to relax opposite shoulder with levator stretch.    Personal Factors and Comorbidities Profession    Examination-Activity Limitations Carry;Lift    Examination-Participation Restrictions Community Activity;Interpersonal Relationship    Stability/Clinical Decision Making Evolving/Moderate complexity    Rehab Potential Good    PT Frequency 2x / week    PT Duration 6 weeks    PT Treatment/Interventions ADLs/Self Care Home Management;Electrical Stimulation;Iontophoresis 4mg /ml Dexamethasone;Moist Heat;Traction;Neuromuscular re-education;Therapeutic exercise;Therapeutic activities;Patient/family education;Manual techniques;Dry needling;Passive range of motion;Taping    PT Next Visit Plan LE flexibility/strength, traction if indicated, cervical ex's           Patient will benefit from skilled therapeutic intervention in order to improve the following deficits and impairments:  Abnormal gait, Decreased range of motion, Difficulty walking,  Decreased endurance, Increased muscle spasms, Decreased activity tolerance, Pain, Hypomobility, Impaired flexibility, Decreased strength, Postural dysfunction  Visit Diagnosis: Cervicalgia  Muscle weakness (generalized)  Cramp and spasm     Problem List Patient Active Problem List   Diagnosis Date Noted  . Chronic insomnia 08/01/2020  . DOE (dyspnea on exertion) 11/29/2019  . Achilles tendon contracture, left   . Acquired equinus deformity of both feet 10/22/2019  . S/P nasal septoplasty 05/31/2019  . Preoperative cardiovascular examination 05/17/2019  . Mild CAD 05/17/2019  . Chronic pansinusitis 04/28/2019  . Plantar fasciitis of left foot 02/28/2019  . Type II diabetes mellitus with neurological manifestations ([place]) 02/28/2019  . S/P ablation of atrial fibrillation   . Paroxysmal atrial fibrillation ([place]) 06/06/2018  . Essential hypertension 06/06/2018  . Diabetes mellitus due to underlying condition with unspecified complications ([place]) 72/53/6644  . Sleep apnea 06/06/2018  . Overweight 06/06/2018  . Bipolar disorder, manic ([place]) 05/29/2016  . MDD (major depressive disorder), recurrent severe, without psychosis ([place]) 12/27/2015  . Ventral incisional hernia 01/29/2015  . Perirectal abscess 02/18/2012  . SPONDYLOSIS 09/19/2008    Scot Jun, PTA 09/03/2020, 1:50 PM  [place] [address] [place], [place], [address] Phone: [phone]   Fax:  [phone]  Name: Gary Grimes MRN: [id number] Date of Birth: Dec 12, 1961

## 2020-09-06 ENCOUNTER — Ambulatory Visit: Admitting: Physical Therapy

## 2020-09-10 ENCOUNTER — Ambulatory Visit (INDEPENDENT_AMBULATORY_CARE_PROVIDER_SITE_OTHER): Payer: PRIVATE HEALTH INSURANCE | Admitting: Family Medicine

## 2020-09-10 ENCOUNTER — Other Ambulatory Visit: Payer: Self-pay

## 2020-09-10 ENCOUNTER — Encounter (INDEPENDENT_AMBULATORY_CARE_PROVIDER_SITE_OTHER): Payer: Self-pay | Admitting: Family Medicine

## 2020-09-10 ENCOUNTER — Ambulatory Visit: Admitting: Physical Therapy

## 2020-09-10 VITALS — BP 124/72 | HR 86 | Temp 98.2°F | Ht 71.0 in | Wt 291.0 lb

## 2020-09-10 DIAGNOSIS — R632 Polyphagia: Secondary | ICD-10-CM | POA: Diagnosis not present

## 2020-09-10 DIAGNOSIS — Z9189 Other specified personal risk factors, not elsewhere classified: Secondary | ICD-10-CM

## 2020-09-10 DIAGNOSIS — F3289 Other specified depressive episodes: Secondary | ICD-10-CM

## 2020-09-10 DIAGNOSIS — Z6841 Body Mass Index (BMI) 40.0 and over, adult: Secondary | ICD-10-CM

## 2020-09-10 DIAGNOSIS — Z794 Long term (current) use of insulin: Secondary | ICD-10-CM

## 2020-09-10 DIAGNOSIS — E559 Vitamin D deficiency, unspecified: Secondary | ICD-10-CM

## 2020-09-10 DIAGNOSIS — E1169 Type 2 diabetes mellitus with other specified complication: Secondary | ICD-10-CM

## 2020-09-12 ENCOUNTER — Ambulatory Visit: Admitting: Physical Therapy

## 2020-09-12 NOTE — Progress Notes (Signed)
Chief Complaint:   OBESITY Gary Grimes is here to discuss his progress with his obesity treatment plan along with follow-up of his obesity related diagnoses. Gary Grimes is on the Category 3 Plan and states he is following his eating plan approximately 60-70% of the time. Gary Grimes states he is walking for 30 minutes 3-4 times per week.  Today's visit was #: 3 Starting weight: 297 lbs Starting date: 08/06/2020 Today's weight: 291 lbs Today's date: 09/10/2020 Total lbs lost to date: 6 lbs Total lbs lost since last in-office visit: 5 lbs Total weight loss percentage to date: -2.02%  Interim History: He hurt his back a few weeks ago (August 24) and needed prednisone.  His blood glucose increased to 600, and is now back down to 218 this morning (after eating).  He is on Novolin 30 units.    Assessment/Plan:   1. Vitamin D deficiency Current vitamin D is 28.4, tested on 08/06/2020. Not at goal. Optimal goal > 50 ng/dL. There is also evidence to support a goal of >70 ng/dL in patients with cancer and heart disease. Plan: Continue Vitamin D @50 ,000 IU every week with follow-up for routine testing of Vitamin D at least 2-3 times per year to avoid over-replacement.  2. Type 2 diabetes mellitus with other specified complication, with long-term current use of insulin (Gary Grimes) He had to restart Actos due to prednisone burst.  He is planning to wean Actos this week.  Insulin 70/30 at 30 units twice daily.  He is taking Ozempic regularly now. Plan: Continue Ozempic. He will continue to focus on protein-rich, low simple carbohydrate foods. We reviewed the importance of hydration, regular exercise for stress reduction, and restorative sleep.   Lab Results  Component Value Date   HGBA1C 8.4 (H) 08/06/2020   HGBA1C 6.0 (H) 05/29/2019   Lab Results  Component Value Date   LDLCALC 67 08/06/2020   CREATININE 1.17 08/06/2020   Lab Results  Component Value Date   INSULIN 24.6 08/06/2020   3.  Polyphagia Secondary to medications. Will continue to monitor symptoms as they relate to his weight loss journey. This issue directly impacts care plan for optimization of BMI and metabolic health as it impacts the patient's ability to make lifestyle changes.  4. Other depression, with emotional eating We discussed Qsymia.  He will call his psychiatrist to see if they approve it.  5. At risk for heart disease Gary Grimes was given approximately 15 minutes of coronary artery disease prevention counseling today. He is 59 y.o. male and has risk factors for heart disease including obesity and metabolic syndrome. We discussed intensive lifestyle modifications today with an emphasis on specific weight loss instructions and strategies.   During insulin resistance, several metabolic alterations induce the development of cardiovascular disease. For instance, insulin resistance can induce an imbalance in glucose metabolism that generates chronic hyperglycemia, which in turn triggers oxidative stress and causes an inflammatory response that leads to cell damage. Insulin resistance can also alter systemic lipid metabolism which then leads to the development of dyslipidemia and the well-known lipid triad: (1) high levels of plasma triglycerides, (2) low levels of high-density lipoprotein, and (3) the appearance of small dense low-density lipoproteins. This triad, along with endothelial dysfunction, which can also be induced by aberrant insulin signaling, contribute to atherosclerotic plaque formation.   6. Class 3 severe obesity with serious comorbidity and body mass index (BMI) of 40.0 to 44.9 in adult, unspecified obesity type (HCC) Gary Grimes is currently in the action stage  of change. As such, his goal is to continue with weight loss efforts. He has agreed to the Category 3 Plan.   Exercise goals: No exercise has been prescribed at this time. For substantial health benefits, adults should do at least 150 minutes (2  hours and 30 minutes) a week of moderate-intensity, or 75 minutes (1 hour and 15 minutes) a week of vigorous-intensity aerobic physical activity, or an equivalent combination of moderate- and vigorous-intensity aerobic activity. Aerobic activity should be performed in episodes of at least 10 minutes, and preferably, it should be spread throughout the week.  Behavioral modification strategies: increasing lean protein intake.  Gary Grimes has agreed to follow-up with our clinic in 2 weeks. He was informed of the importance of frequent follow-up visits to maximize his success with intensive lifestyle modifications for his multiple health conditions.   Objective:   Blood pressure 124/72, pulse 86, temperature 98.2 F (36.8 C), temperature source Oral, height 5\' 11"  (1.803 m), weight 291 lb (132 kg), SpO2 95 %. Body mass index is 40.59 kg/m.  General: Cooperative, alert, well developed, in no acute distress. HEENT: Conjunctivae and lids unremarkable. Cardiovascular: Regular rhythm.  Lungs: Normal work of breathing. Neurologic: No focal deficits.   Lab Results  Component Value Date   CREATININE 1.17 08/06/2020   BUN 17 08/06/2020   NA 141 08/06/2020   K 4.8 08/06/2020   CL 103 08/06/2020   CO2 21 08/06/2020   Lab Results  Component Value Date   ALT 24 08/06/2020   AST 19 08/06/2020   ALKPHOS 82 08/06/2020   BILITOT 0.5 08/06/2020   Lab Results  Component Value Date   HGBA1C 8.4 (H) 08/06/2020   HGBA1C 6.0 (H) 05/29/2019   Lab Results  Component Value Date   INSULIN 24.6 08/06/2020   Lab Results  Component Value Date   TSH 1.450 08/06/2020   Lab Results  Component Value Date   CHOL 152 08/06/2020   HDL 43 08/06/2020   LDLCALC 67 08/06/2020   TRIG 263 (H) 08/06/2020   Lab Results  Component Value Date   WBC 7.2 08/06/2020   HGB 14.2 08/06/2020   HCT 41.1 08/06/2020   MCV 93 08/06/2020   PLT 269 08/06/2020   Lab Results  Component Value Date   IRON 107 08/06/2020    TIBC 309 08/06/2020   FERRITIN 179 08/06/2020   Attestation Statements:   Reviewed by clinician on day of visit: allergies, medications, problem list, medical history, surgical history, family history, social history, and previous encounter notes.  I, Water quality scientist, CMA, am acting as transcriptionist for Briscoe Deutscher, DO  I have reviewed the above documentation for accuracy and completeness, and I agree with the above. Briscoe Deutscher, DO

## 2020-09-17 ENCOUNTER — Encounter: Payer: No Typology Code available for payment source | Admitting: Physical Therapy

## 2020-09-19 ENCOUNTER — Encounter: Payer: No Typology Code available for payment source | Admitting: Physical Therapy

## 2020-09-19 DIAGNOSIS — M5416 Radiculopathy, lumbar region: Secondary | ICD-10-CM | POA: Insufficient documentation

## 2020-09-19 DIAGNOSIS — M51369 Other intervertebral disc degeneration, lumbar region without mention of lumbar back pain or lower extremity pain: Secondary | ICD-10-CM | POA: Insufficient documentation

## 2020-09-19 DIAGNOSIS — R638 Other symptoms and signs concerning food and fluid intake: Secondary | ICD-10-CM

## 2020-09-19 DIAGNOSIS — Y990 Civilian activity done for income or pay: Secondary | ICD-10-CM

## 2020-09-19 DIAGNOSIS — E109 Type 1 diabetes mellitus without complications: Secondary | ICD-10-CM | POA: Insufficient documentation

## 2020-09-19 DIAGNOSIS — M5136 Other intervertebral disc degeneration, lumbar region: Secondary | ICD-10-CM

## 2020-09-19 HISTORY — DX: Civilian activity done for income or pay: Y99.0

## 2020-09-19 HISTORY — DX: Type 1 diabetes mellitus without complications: E10.9

## 2020-09-19 HISTORY — DX: Radiculopathy, lumbar region: M54.16

## 2020-09-19 HISTORY — DX: Other intervertebral disc degeneration, lumbar region: M51.36

## 2020-09-19 HISTORY — DX: Other intervertebral disc degeneration, lumbar region without mention of lumbar back pain or lower extremity pain: M51.369

## 2020-09-19 HISTORY — DX: Other symptoms and signs concerning food and fluid intake: R63.8

## 2020-09-23 ENCOUNTER — Telehealth (INDEPENDENT_AMBULATORY_CARE_PROVIDER_SITE_OTHER): Payer: Self-pay

## 2020-09-23 NOTE — Telephone Encounter (Signed)
Pts PCP office called and stated that the patient is unable to take Qsymia due to patient being at Cardiovascular risk.

## 2020-09-24 ENCOUNTER — Encounter: Payer: No Typology Code available for payment source | Admitting: Physical Therapy

## 2020-09-24 ENCOUNTER — Ambulatory Visit (INDEPENDENT_AMBULATORY_CARE_PROVIDER_SITE_OTHER): Payer: PRIVATE HEALTH INSURANCE | Admitting: Family Medicine

## 2020-09-26 ENCOUNTER — Encounter: Payer: No Typology Code available for payment source | Admitting: Physical Therapy

## 2020-09-30 ENCOUNTER — Telehealth: Payer: Self-pay | Admitting: Cardiology

## 2020-09-30 ENCOUNTER — Other Ambulatory Visit: Payer: Self-pay

## 2020-09-30 ENCOUNTER — Ambulatory Visit (INDEPENDENT_AMBULATORY_CARE_PROVIDER_SITE_OTHER): Payer: PRIVATE HEALTH INSURANCE | Admitting: Family Medicine

## 2020-09-30 ENCOUNTER — Encounter (INDEPENDENT_AMBULATORY_CARE_PROVIDER_SITE_OTHER): Payer: Self-pay | Admitting: Family Medicine

## 2020-09-30 VITALS — BP 114/74 | HR 75 | Temp 98.3°F | Ht 71.0 in | Wt 289.0 lb

## 2020-09-30 DIAGNOSIS — E1169 Type 2 diabetes mellitus with other specified complication: Secondary | ICD-10-CM | POA: Diagnosis not present

## 2020-09-30 DIAGNOSIS — Z6841 Body Mass Index (BMI) 40.0 and over, adult: Secondary | ICD-10-CM

## 2020-09-30 DIAGNOSIS — Z9989 Dependence on other enabling machines and devices: Secondary | ICD-10-CM

## 2020-09-30 DIAGNOSIS — R0789 Other chest pain: Secondary | ICD-10-CM

## 2020-09-30 DIAGNOSIS — G4733 Obstructive sleep apnea (adult) (pediatric): Secondary | ICD-10-CM | POA: Diagnosis not present

## 2020-09-30 DIAGNOSIS — Z794 Long term (current) use of insulin: Secondary | ICD-10-CM

## 2020-09-30 NOTE — Telephone Encounter (Signed)
New Message  Pt c/o of Chest Pain: STAT if CP now or developed within 24 hours  1. Are you having CP right now? Not Today, Happened Saturday. Pt had chest pressure, felt very fatigued. He felt like he couldn't pick up his feet to walk   2. Are you experiencing any other symptoms (ex. SOB, nausea, vomiting, sweating)? He did have SOB and Nausea On Saturday  -Not currently having these symptoms   3. How long have you been experiencing CP? Several hours on Saturday   4. Is your CP continuous or coming and going? Chest pressure was continuous (Saturday)  5. Have you taken Nitroglycerin? No   Pt is not having symptoms right now. He says he had symptoms on Saturday, he says he should have went to the ED but didn't. He is requesting to be seen sooner then scheduled appt on 10/12 but Dr [name] has no sooner appts at this time   Please call  ?

## 2020-09-30 NOTE — Telephone Encounter (Signed)
Called and spoke with pt regarding chest pain and appt. Pt states that the pain has improved at this time but he would like top be seen. Pt has an appt in [place] on 10/02/20 at 3:00. Pt advised to go to call 911 or go to the ED for recurrence of chest pain. Pt verbalized understanding and had no additional questions.

## 2020-09-30 NOTE — Telephone Encounter (Signed)
LMTCB regarding patients symptoms over the weekend.

## 2020-10-01 ENCOUNTER — Other Ambulatory Visit: Payer: Self-pay

## 2020-10-01 DIAGNOSIS — E559 Vitamin D deficiency, unspecified: Secondary | ICD-10-CM | POA: Insufficient documentation

## 2020-10-01 DIAGNOSIS — R519 Headache, unspecified: Secondary | ICD-10-CM | POA: Insufficient documentation

## 2020-10-01 DIAGNOSIS — M1990 Unspecified osteoarthritis, unspecified site: Secondary | ICD-10-CM | POA: Insufficient documentation

## 2020-10-01 DIAGNOSIS — E781 Pure hyperglyceridemia: Secondary | ICD-10-CM | POA: Insufficient documentation

## 2020-10-01 DIAGNOSIS — F419 Anxiety disorder, unspecified: Secondary | ICD-10-CM | POA: Insufficient documentation

## 2020-10-01 DIAGNOSIS — E7800 Pure hypercholesterolemia, unspecified: Secondary | ICD-10-CM | POA: Insufficient documentation

## 2020-10-01 DIAGNOSIS — F410 Panic disorder [episodic paroxysmal anxiety] without agoraphobia: Secondary | ICD-10-CM | POA: Insufficient documentation

## 2020-10-01 DIAGNOSIS — R7303 Prediabetes: Secondary | ICD-10-CM | POA: Insufficient documentation

## 2020-10-01 DIAGNOSIS — F32A Depression, unspecified: Secondary | ICD-10-CM | POA: Insufficient documentation

## 2020-10-01 DIAGNOSIS — K5903 Drug induced constipation: Secondary | ICD-10-CM | POA: Insufficient documentation

## 2020-10-01 DIAGNOSIS — K5900 Constipation, unspecified: Secondary | ICD-10-CM | POA: Insufficient documentation

## 2020-10-01 DIAGNOSIS — M2550 Pain in unspecified joint: Secondary | ICD-10-CM | POA: Insufficient documentation

## 2020-10-01 DIAGNOSIS — E119 Type 2 diabetes mellitus without complications: Secondary | ICD-10-CM | POA: Insufficient documentation

## 2020-10-01 DIAGNOSIS — I209 Angina pectoris, unspecified: Secondary | ICD-10-CM | POA: Insufficient documentation

## 2020-10-01 DIAGNOSIS — E1142 Type 2 diabetes mellitus with diabetic polyneuropathy: Secondary | ICD-10-CM | POA: Insufficient documentation

## 2020-10-01 DIAGNOSIS — M545 Low back pain, unspecified: Secondary | ICD-10-CM | POA: Insufficient documentation

## 2020-10-01 DIAGNOSIS — G8929 Other chronic pain: Secondary | ICD-10-CM | POA: Insufficient documentation

## 2020-10-01 DIAGNOSIS — F909 Attention-deficit hyperactivity disorder, unspecified type: Secondary | ICD-10-CM | POA: Insufficient documentation

## 2020-10-01 NOTE — Progress Notes (Signed)
Chief Complaint:   OBESITY Gary Grimes is here to discuss his progress with his obesity treatment plan along with follow-up of his obesity related diagnoses. Gary Grimes is on the Category 3 Plan and states he is following his eating plan approximately 60% of the time. Gary Grimes states he has increased his walking to 20 minutes 1-2 times per week.  Today's visit was #: 4 Starting weight: 297 lbs Starting date: 08/06/2020 Today's weight: 289 lbs Today's date: 09/30/2020 Total lbs lost to date: 8 lbs Total lbs lost since last in-office visit: 2 lbs Total weight loss percentage to date: -2.69%  Interim History: Gary Grimes is on Ozempic 1 mg subcu weekly, Jardiance 10 mg daily (added last week), and Novolin 70/30 12 units twice daily sliding scale insulin.  Blood sugar has been in the low 200s.  Yesterday, fasting blood sugar was 164.  He endorses low energy levels.    Sleep:  He has OSA and is using a CPAP machine.  He is sleeping 6-7.5 hours per night.  He has increased his water intake and increased movement.    He has had chest pressure and fatigue, for which he will see Dr. [name] in Cardiology.  Assessment/Plan:   1. Type 2 diabetes mellitus with other specified complication, with long-term current use of insulin (HCC) He is taking Ozempic 1 mg weekly, Jardiance 10 mg daily, and Novolog 12 mg twice daily sliding scale insulin (off Actos, decreased insulin from 36 units twice daily).  He is monitoring his carbs and increasing his water intake.  Lab Results  Component Value Date   HGBA1C 8.4 (H) 08/06/2020   HGBA1C 6.0 (H) 05/29/2019   Lab Results  Component Value Date   LDLCALC 67 08/06/2020   CREATININE 1.17 08/06/2020   Lab Results  Component Value Date   INSULIN 24.6 08/06/2020   2. OSA on CPAP Gary Grimes has a diagnosis of sleep apnea. He reports that he is using a CPAP regularly. OSA is a cause of systemic hypertension and is associated with an increased incidence of stroke,  heart failure, atrial fibrillation, and coronary heart disease. Severe OSA increases all-cause mortality and cardiovascular mortality.   Goal: Treatment of OSA via CPAP compliance and weight loss. . Plasma ghrelin levels (appetite or "hunger hormone") are significantly higher in OSA patients than in BMI-matched controls, but decrease to levels similar to those of obese patients without OSA after CPAP treatment.  . Weight loss improves OSA by several mechanisms, including reduction in fatty tissue in the throat (i.e. parapharyngeal fat) and the tongue. Loss of abdominal fat increases mediastinal traction on the upper airway making it less likely to collapse during sleep. . Studies have also shown that compliance with CPAP treatment improves leptin (hunger inhibitory hormone) imbalance.  3. Other chest pain He says he had chest pain while playing with his grandchild last week.  He described it as chest pressure in the center of his chest.  Exertional.  Radiation to left arm with fatigue and "off" feeling.  He has known CAD with history of coronary angiography in 2019.  He has a visit with Cardiology next week.  Will send message.  4. Class 3 severe obesity with serious comorbidity and body mass index (BMI) of 40.0 to 44.9 in adult, unspecified obesity type Gary Grimes)  Gary Grimes is currently in the action stage of change. As such, his goal is to continue with weight loss efforts. He has agreed to the Category 3 Plan with carbohydrates <150.  Exercise goals: No exercise has been prescribed until his visit with Cardiology.  Behavioral modification strategies: increasing lean protein intake, decreasing simple carbohydrates, increasing vegetables, increasing water intake, decreasing sodium intake and increasing high fiber foods.  Gary Grimes has agreed to follow-up with our clinic in 3 weeks. He was informed of the importance of frequent follow-up visits to maximize his success with intensive lifestyle  modifications for his multiple health conditions.   Objective:   Blood pressure 114/74, pulse 75, temperature 98.3 F (36.8 C), temperature source Oral, height 5\' 11"  (1.803 m), weight 289 lb (131.1 kg), SpO2 96 %. Body mass index is 40.31 kg/m.  General: Cooperative, alert, well developed, in no acute distress. HEENT: Conjunctivae and lids unremarkable. Cardiovascular: Regular rhythm.  Lungs: Normal work of breathing. Neurologic: No focal deficits.   Lab Results  Component Value Date   CREATININE 1.17 08/06/2020   BUN 17 08/06/2020   NA 141 08/06/2020   K 4.8 08/06/2020   CL 103 08/06/2020   CO2 21 08/06/2020   Lab Results  Component Value Date   ALT 24 08/06/2020   AST 19 08/06/2020   ALKPHOS 82 08/06/2020   BILITOT 0.5 08/06/2020   Lab Results  Component Value Date   HGBA1C 8.4 (H) 08/06/2020   HGBA1C 6.0 (H) 05/29/2019   Lab Results  Component Value Date   INSULIN 24.6 08/06/2020   Lab Results  Component Value Date   TSH 1.450 08/06/2020   Lab Results  Component Value Date   CHOL 152 08/06/2020   HDL 43 08/06/2020   LDLCALC 67 08/06/2020   TRIG 263 (H) 08/06/2020   Lab Results  Component Value Date   WBC 7.2 08/06/2020   HGB 14.2 08/06/2020   HCT 41.1 08/06/2020   MCV 93 08/06/2020   PLT 269 08/06/2020   Lab Results  Component Value Date   IRON 107 08/06/2020   TIBC 309 08/06/2020   FERRITIN 179 08/06/2020   Attestation Statements:   Reviewed by clinician on day of visit: allergies, medications, problem list, medical history, surgical history, family history, social history, and previous encounter notes.  Time spent on visit including pre-visit chart review and post-visit care and charting was 45 minutes.   I, Water quality scientist, CMA, am acting as transcriptionist for Briscoe Deutscher, DO  I have reviewed the above documentation for accuracy and completeness, and I agree with the above. Briscoe Deutscher, DO

## 2020-10-02 ENCOUNTER — Other Ambulatory Visit: Payer: Self-pay

## 2020-10-02 ENCOUNTER — Ambulatory Visit (INDEPENDENT_AMBULATORY_CARE_PROVIDER_SITE_OTHER): Payer: No Typology Code available for payment source | Admitting: Cardiology

## 2020-10-02 ENCOUNTER — Encounter: Payer: Self-pay | Admitting: Cardiology

## 2020-10-02 VITALS — BP 118/70 | HR 82 | Ht 71.0 in | Wt 292.0 lb

## 2020-10-02 DIAGNOSIS — G4733 Obstructive sleep apnea (adult) (pediatric): Secondary | ICD-10-CM

## 2020-10-02 DIAGNOSIS — I2510 Atherosclerotic heart disease of native coronary artery without angina pectoris: Secondary | ICD-10-CM

## 2020-10-02 DIAGNOSIS — R079 Chest pain, unspecified: Secondary | ICD-10-CM

## 2020-10-02 DIAGNOSIS — E7800 Pure hypercholesterolemia, unspecified: Secondary | ICD-10-CM

## 2020-10-02 DIAGNOSIS — E1149 Type 2 diabetes mellitus with other diabetic neurological complication: Secondary | ICD-10-CM

## 2020-10-02 DIAGNOSIS — I10 Essential (primary) hypertension: Secondary | ICD-10-CM

## 2020-10-02 MED ORDER — RANOLAZINE ER 500 MG PO TB12: 500.0000 mg | ORAL_TABLET | Freq: Two times a day (BID) | ORAL | 0 refills | Status: DC

## 2020-10-02 MED ORDER — ASPIRIN EC 81 MG PO TBEC
81.0000 mg | DELAYED_RELEASE_TABLET | Freq: Every day | ORAL | 3 refills | Status: DC
Start: 2020-10-02 — End: 2021-10-17

## 2020-10-02 MED ORDER — RANOLAZINE ER 1000 MG PO TB12: 1000.0000 mg | ORAL_TABLET | Freq: Two times a day (BID) | ORAL | 3 refills | Status: DC

## 2020-10-02 NOTE — Addendum Note (Signed)
Addended by: Truddie Hidden on: 10/02/2020 04:39 PM   Modules accepted: Orders

## 2020-10-02 NOTE — Progress Notes (Signed)
Cardiology Office Note:    Date:  10/02/2020   ID:  Gary Grimes, DOB 12-Apr-1961, MRN [id number]  PCP:  Gary Rasmussen, MD  Cardiologist:  Gary Lindau, MD   Referring MD: Gary Rasmussen, MD    ASSESSMENT:    1. Chest pain of uncertain etiology   2. Essential hypertension   3. Mild CAD   4. High cholesterol   5. OSA (obstructive sleep apnea)   6. Type II diabetes mellitus with neurological manifestations (Gary Grimes)    PLAN:    In order of problems listed above:  1. Coronary artery disease: Secondary prevention stressed with the patient.  Importance of compliance with diet medication stressed any vocalized understanding.  In view of his symptoms I will do a troponin blood work today.  He also has nitroglycerin with him handy if he needs to use it.  I educated him and told him about it already.  He will also take a coated baby aspirin on a daily basis.  He has no contraindication for this. 2. In view of the above symptoms I would like to initiate him on Ranexa.  He will be seen in follow-up appointment in 2 weeks or earlier if any concerns.  He knows to go to nearest emergency room for any concerning symptoms. 3. Diabetes mellitus: Diet was emphasized.  He promises to do better.  I told him that he might even need to see endocrinologist if this is not getting under better control.  He told me the last hemoglobin A1c was greater than 8. 4. Essential hypertension and morbid obesity: Diet was emphasized.  Lifestyle modification was urged and he promises to follow that.  He tells me that he is lost significant weight since my last discussion with him in December. 5. Patient will be seen in follow-up appointment in 2 weeks or earlier if the patient has any concerns    Medication Adjustments/Labs and Tests Ordered: Current medicines are reviewed at length with the patient today.  Concerns regarding medicines are outlined above.  Orders Placed This Encounter  Procedures  . EKG  12-Lead   No orders of the defined types were placed in this encounter.    Chief Complaint  Patient presents with  . Chest Pain  . Fatigue     History of Present Illness:    Gary Grimes is a 59 y.o. male.  Patient has past medical history of nonobstructive coronary artery disease essential hypertension and morbid obesity and dyslipidemia.  He underwent coronary angiography in 2019 and was found to have nonobstructive mild coronary artery disease.  Subsequently is done well.  He tells me that he walked 29 steps on Friday and had some chest tightness the next day.  There was some radiation to the left arm.  No orthopnea or PND.  Subsequently has felt well.  Today he has no symptoms at all.  He is work today.  At the time of my evaluation, the patient is alert awake oriented and in no distress.  Past Medical History:  Diagnosis Date  . Achilles tendon contracture, left   . Acquired equinus deformity of both feet 10/22/2019  . ADHD   . Anginal pain ([place])    ER visit 01/14/2014 visit on chart   . Anxiety    pt denies  . Arthritis   . Bipolar 1 disorder ([place]) 01/15/2020  . Bipolar disorder ([place])   . Bipolar disorder, manic ([place]) 05/29/2016  . Chronic a-fib ([place]) 06/06/2018  .  Chronic insomnia 08/01/2020  . Chronic lower back pain   . Chronic pansinusitis 04/28/2019  . Constipation   . Controlled type 2 diabetes mellitus, with long-term current use of insulin ([place]) 06/06/2018  . 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/19/2020  . Depression    did get seen in er 4/13 for evaluation-psyc situational none recent  . DOE (dyspnea on exertion) 11/29/2019  . Essential hypertension 06/06/2018  . GERD (gastroesophageal reflux disease)   . Headache    none recent  . High cholesterol   . High triglycerides   . HLD (hyperlipidemia) 01/15/2020  . Hypertension   . Increased body mass index 09/19/2020  . Joint pain   . Levator syndrome 2001   history   . Lumbar pain 09/19/2008   Qualifier:  Diagnosis of  By: Aline Brochure MD, Dorothyann Peng    . Lumbar radiculopathy 09/19/2020  . 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 Mild CAD 05/17/2019  . Neuropathy   . OSA (obstructive sleep apnea) 01/15/2020  . OSA on CPAP   . Overweight 06/06/2018  . Pain in thoracic spine 09/19/2008   Qualifier: Diagnosis of  By: Aline Brochure MD, Dorothyann Peng    . Panic attacks   . Perirectal abscess   . Plantar fasciitis of left foot 02/28/2019  . Prediabetes   . Preoperative cardiovascular examination 05/17/2019  . S/P ablation of atrial fibrillation   . S/P nasal septoplasty 05/31/2019  . Sleep apnea 06/06/2018  . SPONDYLOSIS 09/19/2008   Qualifier: Diagnosis of  By: Aline Brochure MD, Dorothyann Peng    . Type 1 diabetes mellitus ([place]) 09/19/2020  . Type II diabetes mellitus ([place])   . Type II diabetes mellitus with neurological manifestations ([place]) 02/28/2019  . Ventral incisional hernia 01/29/2015  . Vitamin D deficiency   . Work related injury 09/19/2020    Past Surgical History:  Procedure Laterality Date  . ANAL FISSURE REPAIR  08/05/2000   proctoscopy  . APPENDECTOMY  1984  . ATRIAL FIBRILLATION ABLATION  10/28/2018  . ATRIAL FIBRILLATION ABLATION N/A 10/28/2018   Procedure: ATRIAL FIBRILLATION ABLATION;  Surgeon: Constance Haw, MD;  Location: [facility];  Service: Cardiovascular;  Laterality: N/A;  . BIOPSY  05/24/2019   Procedure: BIOPSY;  Surgeon: Ronald Lobo, MD;  [facility]: [facility];  Service: Endoscopy;;  . BIOPSY  08/10/2019   Procedure: BIOPSY;  Surgeon: Ronald Lobo, MD;  [facility];  Service: Endoscopy;;  . COLONOSCOPY  2011  . COLONOSCOPY WITH PROPOFOL N/A 08/10/2019   Procedure: COLONOSCOPY WITH PROPOFOL;  Surgeon: Ronald Lobo, MD;  [facility];  Service: Endoscopy;  Laterality: N/A;  . ESOPHAGOGASTRODUODENOSCOPY (EGD) WITH PROPOFOL N/A 05/24/2019   Procedure: ESOPHAGOGASTRODUODENOSCOPY (EGD) WITH PROPOFOL;  Surgeon:  Ronald Lobo, MD;  [facility];  Service: Endoscopy;  Laterality: N/A;  . GASTROCNEMIUS RECESSION Left 11/03/2019   Procedure: LEFT GASTROCNEMIUS RECESSION;  Surgeon: Newt Minion, MD;  Location: [facility];  Service: Orthopedics;  Laterality: Left;  . HERNIA REPAIR    . INSERTION OF MESH N/A 01/29/2015   Procedure: INSERTION OF MESH;  Surgeon: Excell Seltzer, MD;  [facility];  Service: General;  Laterality: N/A;  . IRRIGATION AND DEBRIDEMENT ABSCESS  02/18/2012   peri-rectal  . LEFT HEART CATH AND CORONARY ANGIOGRAPHY N/A 06/08/2018   Procedure: LEFT HEART CATH AND CORONARY ANGIOGRAPHY;  Surgeon: Leonie Man, MD;  Location: [facility];  Service: Cardiovascular;  Laterality: N/A;  . NASAL SEPTOPLASTY W/ TURBINOPLASTY  05/31/2019  . NASAL SEPTOPLASTY W/ TURBINOPLASTY Bilateral 05/31/2019   Procedure: NASAL SEPTOPLASTY WITH BILATERAL TURBINATE REDUCTION;  Surgeon: Leta Baptist, MD;  Location: [facility];  Service: ENT;  Laterality: Bilateral;  . PLANTAR FASCIA RELEASE Left 11/03/2019   Procedure: PLANTAR FASCIA RELEASE LEFT FOOT;  Surgeon: Newt Minion, MD;  Location: [facility];  Service: Orthopedics;  Laterality: Left;  . POLYPECTOMY  08/10/2019   Procedure: POLYPECTOMY;  Surgeon: Ronald Lobo, MD;  [facility];  Service: Endoscopy;;  . SHOULDER ARTHROSCOPY Left ?2009   "repaired  AC joint; reattached bicept tendon"  . SHOULDER ARTHROSCOPY W/ LABRAL REPAIR Left 08/08/2007  . UMBILICAL HERNIA REPAIR  10/27/2010  . VENTRAL HERNIA REPAIR N/A 01/29/2015   Procedure: LAPAROSCOPIC VENTRAL HERNIA;  Surgeon: Excell Seltzer, MD;  [facility];  Service: General;  Laterality: N/A;    Current Medications: Current Meds  Medication Sig  . atorvastatin (LIPITOR) 10 MG tablet Take 1 tablet (10 mg total) by mouth daily.  . cariprazine (VRAYLAR) capsule Take by mouth.  . carvedilol (COREG) 3.125 MG tablet Take 3.125 mg by mouth 2 (two) times daily with a meal.  .  HYDROcodone-acetaminophen (NORCO/VICODIN) 5-325 MG tablet Take 1 tablet by mouth every 6 (six) hours as needed for moderate pain.  . hydrOXYzine (ATARAX/VISTARIL) 25 MG tablet Take 25 mg by mouth at bedtime.  Marland Kitchen ibuprofen (IBU) 800 MG tablet Take 800 mg by mouth 3 (three) times daily as needed.  Marland Kitchen JARDIANCE 10 MG TABS tablet Take 10 mg by mouth daily.  . meloxicam (MOBIC) 7.5 MG tablet Take 1 tablet (7.5 mg total) by mouth daily.  . methocarbamol (ROBAXIN) 750 MG tablet Take 1 tablet (750 mg total) by mouth 4 (four) times daily.  . nitroGLYCERIN (NITROSTAT) 0.4 MG SL tablet Place 0.4 mg under the tongue daily. Every 5 minutes for chest pain  . NOVOLIN 70/30 (70-30) 100 UNIT/ML injection Inject 36 Units into the skin 2 (two) times daily with a meal. Per sliding scale  . olmesartan (BENICAR) 40 MG tablet Take 0.5 tablets (20 mg total) by mouth daily. (Patient taking differently: Take 40 mg by mouth daily. )  . pregabalin (LYRICA) 150 MG capsule Take 1 capsule (150 mg total) by mouth 2 (two) times daily.  . Semaglutide, 1 MG/DOSE, (OZEMPIC, 1 MG/DOSE,) 2 MG/1.5ML SOPN Inject 1 mg into the skin once a week.  . traZODone (DESYREL) 100 MG tablet Take 1 tablet (100 mg total) by mouth at bedtime as needed for sleep. (Patient taking differently: Take 100 mg by mouth at bedtime. )  . zolpidem (AMBIEN CR) 12.5 MG CR tablet Take 1 tablet (12.5 mg total) by mouth at bedtime as needed for sleep.  . [DISCONTINUED] albuterol (VENTOLIN HFA) 108 (90 Base) MCG/ACT inhaler Inhale 2 puffs into the lungs every 6 (six) hours as needed.     Allergies:   Morphine   Social History   Socioeconomic History  . Marital status: Widowed    Spouse name: Not on file  . Number of children: Not on file  . Years of education: Not on file  . Highest education level: Not on file  Occupational History  . 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 Smoking status: Former Smoker    Packs/day: 0.50    Years: 1.00    Pack  years: 0.50    Types: Cigarettes  . Smokeless tobacco: Never Used  . Tobacco comment: quit 1983  Vaping Use  . Vaping Use: Never  used  Substance and Sexual Activity  . Alcohol use: Not Currently    Comment: rare wine  . Drug use: Not Currently    Comment: not since 70'S  . Sexual activity: Not on file  Other Topics Concern  . Not on file  Social History Narrative  . Not on file   Social Determinants of Health   Financial Resource Strain:   . Difficulty of Paying Living Expenses: Not on file  Food Insecurity:   . Worried About Charity fundraiser in the Last Year: Not on file  . Ran Out of Food in the Last Year: Not on file  Transportation Needs:   . Lack of Transportation (Medical): Not on file  . Lack of Transportation (Non-Medical): Not on file  Physical Activity:   . Days of Exercise per Week: Not on file  . Minutes of Exercise per Session: Not on file  Stress:   . Feeling of Stress : Not on file  Social Connections:   . Frequency of Communication with Friends and Family: Not on file  . Frequency of Social Gatherings with Friends and Family: Not on file  . Attends Religious Services: Not on file  . Active Member of Clubs or Organizations: Not on file  . Attends Archivist Meetings: Not on file  . Marital Status: Not on file     Family History: The patient's family history includes Anxiety disorder in his father; Bipolar disorder in his father; Breast cancer in his mother; Depression in his father; Diabetes in his father and mother; Heart disease in his father and mother; Hyperlipidemia in his father and mother; Hypertension in his father and mother; Obesity in his father and mother; Ovarian cancer in his mother; Sleep apnea in his father and mother.  ROS:   Please see the history of present illness.    All other systems reviewed and are negative.  EKGs/Labs/Other Studies Reviewed:    The following studies were reviewed today: EKG reveals sinus rhythm  and nonspecific ST-T   Recent Labs: 08/06/2020: ALT 24; BUN 17; Creatinine, Ser 1.17; Hemoglobin 14.2; Platelets 269; Potassium 4.8; Sodium 141; TSH 1.450  Recent Lipid Panel    Component Value Date/Time   CHOL 152 08/06/2020 1240   TRIG 263 (H) 08/06/2020 1240   HDL 43 08/06/2020 1240   LDLCALC 67 08/06/2020 1240    Physical Exam:    VS:  BP 118/70 (BP Location: Left Arm, Patient Position: Sitting, Cuff Size: Normal)   Pulse 82   Ht 5\' 11"  (1.803 m)   Wt 292 lb (132.5 kg)   SpO2 98%   BMI 40.73 kg/m     Wt Readings from Last 3 Encounters:  10/02/20 292 lb (132.5 kg)  09/30/20 289 lb (131.1 kg)  09/10/20 291 lb (132 kg)     GEN: Patient is in no acute distress HEENT: Normal NECK: No JVD; No carotid bruits LYMPHATICS: No lymphadenopathy CARDIAC: Hear sounds regular, 2/6 systolic murmur at the apex. RESPIRATORY:  Clear to auscultation without rales, wheezing or rhonchi  ABDOMEN: Soft, non-tender, non-distended MUSCULOSKELETAL:  No edema; No deformity  SKIN: Warm and dry NEUROLOGIC:  Alert and oriented x 3 PSYCHIATRIC:  Normal affect   Signed, Gary Lindau, MD  10/02/2020 3:43 PM    [facility]

## 2020-10-02 NOTE — Patient Instructions (Signed)
Medication Instructions:  Your physician has recommended you make the following change in your medication:   Take Ranexa 500 mg twice daily for 2 weeks. After 2 weeks you will increase to 1000 mg twice daily.  Take 81 mg coated aspirin daily.  *If you need a refill on your cardiac medications before your next appointment, please call your pharmacy*   Lab Work: You had a troponin today.  Your physician recommends that you return for lab work in: before your next visit. You need to have labs done when you are fasting.  You can come Monday through Friday 8:30 am to 12:00 pm and 1:15 to 4:30. You do not need to make an appointment as the order has already been placed. The labs you are going to have done are BMET, LFT and Lipids.   If you have labs (blood work) drawn today and your tests are completely normal, you will receive your results only by: Marland Kitchen MyChart Message (if you have MyChart) OR . A paper copy in the mail If you have any lab test that is abnormal or we need to change your treatment, we will call you to review the results.   Testing/Procedures: None ordered   Follow-Up: At [facility], you and your health needs are our priority.  As part of our continuing mission to provide you with exceptional heart care, we have created designated Provider Care Teams.  These Care Teams include your primary Cardiologist (physician) and Advanced Practice Providers (APPs -  Physician Assistants and Nurse Practitioners) who all work together to provide you with the care you need, when you need it.  We recommend signing up for the patient portal called "MyChart".  Sign up information is provided on this After Visit Summary.  MyChart is used to connect with patients for Virtual Visits (Telemedicine).  Patients are able to view lab/test results, encounter notes, upcoming appointments, etc.  Non-urgent messages can be sent to your provider as well.   To learn more about what you can do with MyChart,  go to [email or web address].    Your next appointment:   2 week(s)  The format for your next appointment:   In Person  Provider:   Jyl Heinz, MD   Other Instructions Ranolazine tablets, extended release What is this medicine? RANOLAZINE (ra NOE la zeen) is a heart medicine. It is used to treat chronic chest pain (angina). This medicine must be taken regularly. It will not relieve an acute episode of chest pain. This medicine may be used for other purposes; ask your health care provider or pharmacist if you have questions. COMMON BRAND NAME(S): Ranexa What should I tell my health care provider before I take this medicine? They need to know if you have any of these conditions:  heart disease  irregular heartbeat  kidney disease  liver disease  low levels of potassium or magnesium in the blood  an unusual or allergic reaction to ranolazine, other medicines, foods, dyes, or preservatives  pregnant or trying to get pregnant  breast-feeding How should I use this medicine? Take this medicine by mouth with a glass of water. Follow the directions on the prescription label. Do not cut, crush, or chew this medicine. Take with or without food. Do not take this medication with grapefruit juice. Take your doses at regular intervals. Do not take your medicine more often then directed. Talk to your pediatrician regarding the use of this medicine in children. Special care may be needed. Overdosage: If you think you  have taken too much of this medicine contact a poison control center or emergency room at once. NOTE: This medicine is only for you. Do not share this medicine with others. What if I miss a dose? If you miss a dose, take it as soon as you can. If it is almost time for your next dose, take only that dose. Do not take double or extra doses. What may interact with this medicine? Do not take this medicine with any of the following medications:  antivirals for HIV or  AIDS  cerivastatin  certain antibiotics like chloramphenicol, clarithromycin, dalfopristin; quinupristin, isoniazid, rifabutin, rifampin, rifapentine  certain medicines used for cancer like imatinib, nilotinib  certain medicines for fungal infections like fluconazole, itraconazole, ketoconazole, posaconazole, voriconazole  certain medicines for irregular heart beat like dronedarone  certain medicines for seizures like carbamazepine, fosphenytoin, oxcarbazepine, phenobarbital, phenytoin  cisapride  conivaptan  cyclosporine  grapefruit or grapefruit juice  lumacaftor; ivacaftor  nefazodone  pimozide  quinacrine  St John's wort  thioridazine This medicine may also interact with the following medications:  alfuzosin  certain medicines for depression, anxiety, or psychotic disturbances like bupropion, citalopram, fluoxetine, fluphenazine, paroxetine, perphenazine, risperidone, sertraline, trifluoperazine  certain medicines for cholesterol like atorvastatin, lovastatin, simvastatin  certain medicines for stomach problems like octreotide, palonosetron, prochlorperazine  eplerenone  ergot alkaloids like dihydroergotamine, ergonovine, ergotamine, methylergonovine  metformin  nicardipine  other medicines that prolong the QT interval (cause an abnormal heart rhythm) like dofetilide, ziprasidone  sirolimus  tacrolimus This list may not describe all possible interactions. Give your health care provider a list of all the medicines, herbs, non-prescription drugs, or dietary supplements you use. Also tell them if you smoke, drink alcohol, or use illegal drugs. Some items may interact with your medicine. What should I watch for while using this medicine? Visit your doctor for regular check ups. Tell your doctor or healthcare professional if your symptoms do not start to get better or if they get worse. This medicine will not relieve an acute attack of angina or chest  pain. This medicine can change your heart rhythm. Your health care provider may check your heart rhythm by ordering an electrocardiogram (ECG) while you are taking this medicine. You may get drowsy or dizzy. Do not drive, use machinery, or do anything that needs mental alertness until you know how this medicine affects you. Do not stand or sit up quickly, especially if you are an older patient. This reduces the risk of dizzy or fainting spells. Alcohol may interfere with the effect of this medicine. Avoid alcoholic drinks. If you are scheduled for any medical or dental procedure, tell your healthcare provider that you are taking this medicine. This medicine can interact with other medicines used during surgery. What side effects may I notice from receiving this medicine? Side effects that you should report to your doctor or health care professional as soon as possible:  allergic reactions like skin rash, itching or hives, swelling of the face, lips, or tongue  breathing problems  changes in vision  fast, irregular or pounding heartbeat  feeling faint or lightheaded, falls  low or high blood pressure  numbness or tingling feelings  ringing in the ears  tremor or shakiness  slow heartbeat (fewer than 50 beats per minute)  swelling of the legs or feet Side effects that usually do not require medical attention (report to your doctor or health care professional if they continue or are bothersome):  constipation  drowsy  dry mouth  headache  nausea or vomiting  stomach upset This list may not describe all possible side effects. Call your doctor for medical advice about side effects. You may report side effects to FDA at 1-800-FDA-1088. Where should I keep my medicine? Keep out of the reach of children. Store at room temperature between 15 and 30 degrees C (59 and 86 degrees F). Throw away any unused medicine after the expiration date. NOTE: This sheet is a summary. It may not  cover all possible information. If you have questions about this medicine, talk to your doctor, pharmacist, or health care provider.  2020 Elsevier/Gold Standard (2018-12-06 09:18:49)

## 2020-10-02 NOTE — Addendum Note (Signed)
Addended by: Truddie Hidden on: 10/02/2020 04:06 PM   Modules accepted: Orders

## 2020-10-03 ENCOUNTER — Telehealth: Payer: Self-pay

## 2020-10-03 ENCOUNTER — Telehealth: Payer: Self-pay | Admitting: Cardiology

## 2020-10-03 DIAGNOSIS — I209 Angina pectoris, unspecified: Secondary | ICD-10-CM

## 2020-10-03 DIAGNOSIS — I208 Other forms of angina pectoris: Secondary | ICD-10-CM

## 2020-10-03 LAB — TROPONIN T: Troponin T (Highly Sensitive): 22 ng/L (ref 0–22)

## 2020-10-03 MED ORDER — METOPROLOL TARTRATE 100 MG PO TABS: 100.0000 mg | ORAL_TABLET | Freq: Once | ORAL | 0 refills | Status: DC

## 2020-10-03 NOTE — Addendum Note (Signed)
Addended by: Truddie Hidden on: 10/03/2020 05:02 PM   Modules accepted: Orders

## 2020-10-03 NOTE — Telephone Encounter (Signed)
Spoke with patient regarding results and recommendation.  Patient verbalizes understanding and is agreeable to plan of care. Advised patient to call back with any issues or concerns.  

## 2020-10-03 NOTE — Telephone Encounter (Signed)
Left message for pt to discuss the cardiac CT that Dr. [name] has recommended. Also sent the same message in [place].  Your cardiac CT will be scheduled at one of the below locations:   [facility] [address] [place], [place] [address] [phone]  [facility], please arrive at the [facility] main entrance of [facility] 30 minutes prior to test start time. Proceed to the [facility] Radiology Department (first floor) to check-in and test prep.  If scheduled at [facility], please arrive 15 mins early for check-in and test prep.  Please follow these instructions carefully (unless otherwise directed):  Hold all erectile dysfunction medications at least 3 days (72 hrs) prior to test.  On the Night Before the Test: . Be sure to Drink plenty of water. . Do not consume any caffeinated/decaffeinated beverages or chocolate 12 hours prior to your test. . Do not take any antihistamines 12 hours prior to your test.   On the Day of the Test: . Drink plenty of water. Do not drink any water within one hour of the test. . Do not eat any food 4 hours prior to the test. . You may take your regular medications prior to the test.  . Take Carvedilol (Coreg) two hours prior to test   After the Test: . Drink plenty of water. . After receiving IV contrast, you may experience a mild flushed feeling. This is normal. . On occasion, you may experience a mild rash up to 24 hours after the test. This is not dangerous. If this occurs, you can take Benadryl 25 mg and increase your fluid intake. . If you experience trouble breathing, this can be serious. If it is severe call 911 IMMEDIATELY. If it is mild, please call our office. . If you take any of these medications: Glipizide/Metformin, Avandament, Glucavance, please do not take 48 hours after completing test unless otherwise instructed.   Once we have confirmed authorization from your  insurance company, we will call you to set up a date and time for your test. Based on how quickly your insurance processes prior authorizations requests, please allow up to 4 weeks to be contacted for scheduling your Cardiac CT appointment. Be advised that routine Cardiac CT appointments could be scheduled as many as 8 weeks after your provider has ordered it.  For non-scheduling related questions, please contact the cardiac imaging nurse navigator should you have any questions/concerns: Marchia Bond, Cardiac Imaging Nurse Navigator Burley Saver, Interim Cardiac Imaging Nurse [place] [facility] Direct Office Dial: [phone]   For scheduling needs, including cancellations and rescheduling, please call Vivien Rota at [phone], option 3.

## 2020-10-03 NOTE — Telephone Encounter (Signed)
Patient states he believes he should have a heart catheterization due to his chest pain yesterday.

## 2020-10-03 NOTE — Telephone Encounter (Signed)
I spoke with patient. He feels he should have a catheterization due to angina episode over the weekend.  Patient saw Dr [name] yesterday.  He has not had any pain since this visit. Pharmacy did not have Ranexa yesterday but they now have in stock and patient is picking it up this afternoon. I told patient I would send message to Dr [name].

## 2020-10-03 NOTE — Telephone Encounter (Signed)
-----   Message from Jenean Lindau, MD sent at 10/03/2020  8:34 AM EDT ----- The results of the study is unremarkable. Please inform patient. I will discuss in detail at next appointment. Cc  primary care/referring physician Jenean Lindau, MD 10/03/2020 8:34 AM

## 2020-10-03 NOTE — Telephone Encounter (Signed)
Please see if you can do a CT FFR for this gentleman. It needs to be done in the next week or 2. Please request.

## 2020-10-08 ENCOUNTER — Ambulatory Visit: Payer: No Typology Code available for payment source | Admitting: Cardiology

## 2020-10-11 ENCOUNTER — Other Ambulatory Visit: Payer: Self-pay

## 2020-10-11 DIAGNOSIS — G4733 Obstructive sleep apnea (adult) (pediatric): Secondary | ICD-10-CM | POA: Insufficient documentation

## 2020-10-11 DIAGNOSIS — F319 Bipolar disorder, unspecified: Secondary | ICD-10-CM | POA: Insufficient documentation

## 2020-10-14 ENCOUNTER — Encounter: Payer: Self-pay | Admitting: Cardiology

## 2020-10-14 ENCOUNTER — Ambulatory Visit (INDEPENDENT_AMBULATORY_CARE_PROVIDER_SITE_OTHER): Payer: PRIVATE HEALTH INSURANCE | Admitting: Cardiology

## 2020-10-14 ENCOUNTER — Other Ambulatory Visit: Payer: Self-pay | Admitting: Cardiology

## 2020-10-14 ENCOUNTER — Other Ambulatory Visit: Payer: Self-pay

## 2020-10-14 VITALS — BP 126/64 | HR 72 | Ht 71.0 in | Wt 294.0 lb

## 2020-10-14 DIAGNOSIS — E782 Mixed hyperlipidemia: Secondary | ICD-10-CM | POA: Diagnosis not present

## 2020-10-14 DIAGNOSIS — I208 Other forms of angina pectoris: Secondary | ICD-10-CM

## 2020-10-14 DIAGNOSIS — I209 Angina pectoris, unspecified: Secondary | ICD-10-CM | POA: Diagnosis not present

## 2020-10-14 DIAGNOSIS — I10 Essential (primary) hypertension: Secondary | ICD-10-CM | POA: Diagnosis not present

## 2020-10-14 DIAGNOSIS — E1149 Type 2 diabetes mellitus with other diabetic neurological complication: Secondary | ICD-10-CM | POA: Diagnosis not present

## 2020-10-14 HISTORY — DX: Angina pectoris, unspecified: I20.9

## 2020-10-14 NOTE — Progress Notes (Signed)
Cardiology Office Note:    Date:  10/14/2020   ID:  Gary Grimes, DOB [birth year], MRN [id number]  PCP:  Gary Rasmussen, MD  Cardiologist:  Gary Lindau, MD   Referring MD: Gary Rasmussen, MD    ASSESSMENT:    1. Essential hypertension   2. Angina pectoris ([place])   3. Mixed hyperlipidemia   4. Type II diabetes mellitus with neurological manifestations ([place])    PLAN:    In order of problems listed above:  1. Angina pectoris: I discussed my findings with the patient in extensive length.  He is taking aspirin and does have nitroglycerin handy.  I had a long discussion with him and several options were given to him again.  He prefers coronary angiography.I discussed coronary angiography and left heart catheterization with the patient at extensive length. Procedure, benefits and potential risks were explained. Patient had multiple questions which were answered to the patient's satisfaction. Patient agreed and consented for the procedure. Further recommendations will be made based on the findings of the coronary angiography. In the interim. The patient has any significant symptoms he knows to go to the nearest emergency room. 2. Essential hypertension: Blood pressure stable 3. Mixed dyslipidemia: Diet was emphasized he promises to do better.  Lipids were reviewed. 4. Morbid obesity: Risks of obesity explained and he promises to diet better after this procedure and take better care of himself. 5. Patient will be seen in follow-up appointment in 6 months or earlier if the patient has any concerns    Medication Adjustments/Labs and Tests Ordered: Current medicines are reviewed at length with the patient today.  Concerns regarding medicines are outlined above.  No orders of the defined types were placed in this encounter.  No orders of the defined types were placed in this encounter.    No chief complaint on file.    History of Present Illness:    Gary Grimes is a 59  y.o. male.  Patient has past medical history of essential hypertension dyslipidemia obstructive sleep apnea.  He has been complaining of chest tightness.  This is of concern to him.  He is used nitroglycerin with relief.  No orthopnea or PND.  He leads a fairly active lifestyle.  He is unable to give much history as per exertional component.  No radiation to the neck or to the arms.  He was scheduled for a CT coronary angiography with FFR but this has taken a significant.  Of time.  Patient comes for follow-up today.  He is on ranolazine and tolerating it well.  He wants to be evaluated for coronary angiography as he is very concerned about his chest pain symptoms.  This is conventional coronary angiography.  At the time of my evaluation, the patient is alert awake oriented and in no distress.  Past Medical History:  Diagnosis Date   Achilles tendon contracture, left    Acquired equinus deformity of both feet 10/22/2019   ADHD    Anginal pain ([place])    ER visit 01/14/2014 visit on chart    Anxiety    pt denies   Arthritis    Bipolar 1 disorder ([place]) 01/15/2020   Bipolar disorder ([place])    Bipolar disorder, manic ([place]) 05/29/2016   Chronic a-fib ([place]) 06/06/2018   Chronic insomnia 08/01/2020   Chronic lower back pain    Chronic pansinusitis 04/28/2019   Constipation    Controlled type 2 diabetes mellitus, with long-term current use of insulin ([place])  06/06/2018   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/19/2020   Depression    did get seen in er 4/13 for evaluation-psyc situational none recent   DOE (dyspnea on exertion) 11/29/2019   Essential hypertension 06/06/2018   GERD (gastroesophageal reflux disease)    Headache    none recent   High cholesterol    High triglycerides    HLD (hyperlipidemia) 01/15/2020   Hypertension    Increased body mass index 09/19/2020   Joint pain    Levator syndrome 2001   history    Lumbar pain 09/19/2008   Qualifier: Diagnosis of  By:  Aline Brochure MD, [place]     Lumbar radiculopathy 09/19/2020   MDD (major depressive disorder), recurrent severe, 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   Mild CAD 05/17/2019   Neuropathy    OSA (obstructive sleep apnea) 01/15/2020   OSA on CPAP    Overweight 06/06/2018   Pain in thoracic spine 09/19/2008   Qualifier: Diagnosis of  By: Aline Brochure MD, [place]     Panic attacks    Perirectal abscess    Plantar fasciitis of left foot 02/28/2019   Prediabetes    Preoperative cardiovascular examination 05/17/2019   S/P ablation of atrial fibrillation    S/P nasal septoplasty 05/31/2019   Sleep apnea 06/06/2018   SPONDYLOSIS 09/19/2008   Qualifier: Diagnosis of  By: Aline Brochure MD, [place]     Type 1 diabetes mellitus ([place]) 09/19/2020   Type II diabetes mellitus ([place])    Type II diabetes mellitus with neurological manifestations ([place]) 02/28/2019   Ventral incisional hernia 01/29/2015   Vitamin D deficiency    Work related injury 09/19/2020    Past Surgical History:  Procedure Laterality Date   ANAL FISSURE REPAIR  08/05/2000   proctoscopy   APPENDECTOMY  1984   ATRIAL FIBRILLATION ABLATION  10/28/2018   ATRIAL FIBRILLATION ABLATION N/A 10/28/2018   Procedure: ATRIAL FIBRILLATION ABLATION;  Surgeon: Constance Haw, MD;  Location: [facility];  Service: Cardiovascular;  Laterality: N/A;   BIOPSY  05/24/2019   Procedure: BIOPSY;  Surgeon: Ronald Lobo, MD;  Location: [facility];  Service: Endoscopy;;   BIOPSY  08/10/2019   Procedure: BIOPSY;  Surgeon: Ronald Lobo, MD;  Location: [facility];  Service: Endoscopy;;   COLONOSCOPY  2011   COLONOSCOPY WITH PROPOFOL N/A 08/10/2019   Procedure: COLONOSCOPY WITH PROPOFOL;  Surgeon: Ronald Lobo, MD;  [facility]: [facility];  Service: Endoscopy;  Laterality: N/A;   ESOPHAGOGASTRODUODENOSCOPY (EGD) WITH PROPOFOL N/A 05/24/2019   Procedure: ESOPHAGOGASTRODUODENOSCOPY (EGD) WITH PROPOFOL;  Surgeon: Ronald Lobo, MD;   [facility];  Service: Endoscopy;  Laterality: N/A;   GASTROCNEMIUS RECESSION Left 11/03/2019   Procedure: LEFT GASTROCNEMIUS RECESSION;  Surgeon: Newt Minion, MD;  Location: [facility];  Service: Orthopedics;  Laterality: Left;   HERNIA REPAIR     INSERTION OF MESH N/A 01/29/2015   Procedure: INSERTION OF MESH;  Surgeon: Excell Seltzer, MD;  Location: [facility];  Service: General;  Laterality: N/A;   IRRIGATION AND DEBRIDEMENT ABSCESS  02/18/2012   peri-rectal   LEFT HEART CATH AND CORONARY ANGIOGRAPHY N/A 06/08/2018   Procedure: LEFT HEART CATH AND CORONARY ANGIOGRAPHY;  Surgeon: Leonie Man, MD;  Location: [facility];  Service: Cardiovascular;  Laterality: N/A;   NASAL SEPTOPLASTY W/ TURBINOPLASTY  05/31/2019   NASAL SEPTOPLASTY W/ TURBINOPLASTY Bilateral 05/31/2019   Procedure: NASAL SEPTOPLASTY WITH BILATERAL TURBINATE REDUCTION;  Surgeon: Leta Baptist, MD;  Location: [facility];  Service: ENT;  Laterality: Bilateral;  PLANTAR FASCIA RELEASE Left 11/03/2019   Procedure: PLANTAR FASCIA RELEASE LEFT FOOT;  Surgeon: Newt Minion, MD;  Location: [facility];  Service: Orthopedics;  Laterality: Left;   POLYPECTOMY  08/10/2019   Procedure: POLYPECTOMY;  Surgeon: Ronald Lobo, MD;  [facility];  Service: Endoscopy;;   SHOULDER ARTHROSCOPY Left ?2009   "repaired  AC joint; reattached bicept tendon"   SHOULDER ARTHROSCOPY W/ LABRAL REPAIR Left 64/33/2951   UMBILICAL HERNIA REPAIR  10/27/2010   VENTRAL HERNIA REPAIR N/A 01/29/2015   Procedure: LAPAROSCOPIC VENTRAL HERNIA;  Surgeon: Excell Seltzer, MD;  Location: [facility];  Service: General;  Laterality: N/A;    Current Medications: Current Meds  Medication Sig   aspirin EC 81 MG tablet Take 1 tablet (81 mg total) by mouth daily. Swallow whole.   atorvastatin (LIPITOR) 10 MG tablet Take 10 mg by mouth daily.   cariprazine (VRAYLAR) capsule Take by mouth daily.    carvedilol (COREG) 3.125 MG tablet Take 3.125 mg  by mouth as needed.    HYDROcodone-acetaminophen (NORCO/VICODIN) 5-325 MG tablet Take 1 tablet by mouth every 6 (six) hours as needed for moderate pain.   hydrOXYzine (ATARAX/VISTARIL) 25 MG tablet Take 25 mg by mouth at bedtime.   ibuprofen (IBU) 800 MG tablet Take 800 mg by mouth 3 (three) times daily as needed.   JARDIANCE 10 MG TABS tablet Take 10 mg by mouth daily.   meloxicam (MOBIC) 7.5 MG tablet Take 1 tablet (7.5 mg total) by mouth daily.   methocarbamol (ROBAXIN) 750 MG tablet Take 750 mg by mouth daily.   nitroGLYCERIN (NITROSTAT) 0.4 MG SL tablet Place 0.4 mg under the tongue daily. Every 5 minutes for chest pain   NOVOLIN 70/30 (70-30) 100 UNIT/ML injection Inject 36 Units into the skin 2 (two) times daily with a meal. Per sliding scale   olmesartan (BENICAR) 40 MG tablet Take 40 mg by mouth daily.   pregabalin (LYRICA) 150 MG capsule Take 1 capsule (150 mg total) by mouth 2 (two) times daily.   ranolazine (RANEXA) 1000 MG SR tablet Take 1 tablet (1,000 mg total) by mouth 2 (two) times daily.   Semaglutide, 1 MG/DOSE, (OZEMPIC, 1 MG/DOSE,) 2 MG/1.5ML SOPN Inject 1 mg into the skin once a week.   Suvorexant (BELSOMRA) 20 MG TABS Take 20 mg by mouth daily.    tiZANidine (ZANAFLEX) 4 MG tablet Take 4 mg by mouth as needed.    traZODone (DESYREL) 100 MG tablet Take 200 mg by mouth at bedtime as needed for sleep.     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  Tobacco Use   Smoking status: Former Smoker    Packs/day: 0.50    Years: 1.00    Pack years: 0.50    Types: Cigarettes   Smokeless tobacco: Never Used   Tobacco comment: quit 1983  Vaping Use   Vaping Use: Never used  Substance and Sexual Activity    Alcohol use: Not Currently    Comment: rare wine   Drug use: Not Currently    Comment: not since 70'S   Sexual activity: Not on file  Other Topics Concern   Not on file  Social History Narrative  Not on file   Social Determinants of Health   Financial Resource Strain:    Difficulty of Paying Living Expenses: Not on file  Food Insecurity:    Worried About [place] in the Last Year: Not on file   Ran Out of Food in the Last Year: Not on file  Transportation Needs:    Lack of Transportation (Medical): Not on file   Lack of Transportation (Non-Medical): Not on file  Physical Activity:    Days of Exercise per Week: Not on file   Minutes of Exercise per Session: Not on file  Stress:    Feeling of Stress : Not on file  Social Connections:    Frequency of Communication with Friends and Family: Not on file   Frequency of Social Gatherings with Friends and Family: Not on file   Attends Religious Services: Not on file   Active Member of Clubs or Organizations: Not on file   Attends Archivist Meetings: Not on file   Marital Status: Not on file     Family History: The patient's family history includes Anxiety disorder in his father; Bipolar disorder in his father; Breast cancer in his mother; Depression in his father; Diabetes in his father and mother; Heart disease in his father and mother; Hyperlipidemia in his father and mother; Hypertension in his father and mother; Obesity in his father and mother; Ovarian cancer in his mother; Sleep apnea in his father and mother.  ROS:   Please see the history of present illness.    All other systems reviewed and are negative.  EKGs/Labs/Other Studies Reviewed:    The following studies were reviewed today: I discussed my findings with the patient at extensive length.  He had a chest pain episode last time and I had evaluated him with a troponin the level of which was within normal limits but at the top of  the range.   Recent Labs: 08/06/2020: ALT 24; BUN 17; Creatinine, Ser 1.17; Hemoglobin 14.2; Platelets 269; Potassium 4.8; Sodium 141; TSH 1.450  Recent Lipid Panel    Component Value Date/Time   CHOL 152 08/06/2020 1240   TRIG 263 (H) 08/06/2020 1240   HDL 43 08/06/2020 1240   LDLCALC 67 08/06/2020 1240    Physical Exam:    VS:  BP 126/64    Pulse 72    Ht 5\' 11"  (1.803 m)    Wt 294 lb (133.4 kg)    SpO2 98%    BMI 41.00 kg/m     Wt Readings from Last 3 Encounters:  10/14/20 294 lb (133.4 kg)  10/02/20 292 lb (132.5 kg)  09/30/20 289 lb (131.1 kg)     GEN: Patient is in no acute distress HEENT: Normal NECK: No JVD; No carotid bruits LYMPHATICS: No lymphadenopathy CARDIAC: Hear sounds regular, 2/6 systolic murmur at the apex. RESPIRATORY:  Clear to auscultation without rales, wheezing or rhonchi  ABDOMEN: Soft, non-tender, non-distended MUSCULOSKELETAL:  No edema; No deformity  SKIN: Warm and dry NEUROLOGIC:  Alert and oriented x 3 PSYCHIATRIC:  Normal affect   Signed, Gary Lindau, MD  10/14/2020 4:43 PM    [facility]

## 2020-10-14 NOTE — H&P (View-Only) (Signed)
Cardiology Office Note:    Date:  10/14/2020   ID:  Gary Grimes, DOB [birth year], MRN [id number]  PCP:  Hayden Rasmussen, MD  Cardiologist:  Jenean Lindau, MD   Referring MD: Hayden Rasmussen, MD    ASSESSMENT:    1. Essential hypertension   2. Angina pectoris ([place])   3. Mixed hyperlipidemia   4. Type II diabetes mellitus with neurological manifestations ([place])    PLAN:    In order of problems listed above:  1. Angina pectoris: I discussed my findings with the patient in extensive length.  He is taking aspirin and does have nitroglycerin handy.  I had a long discussion with him and several options were given to him again.  He prefers coronary angiography.I discussed coronary angiography and left heart catheterization with the patient at extensive length. Procedure, benefits and potential risks were explained. Patient had multiple questions which were answered to the patient's satisfaction. Patient agreed and consented for the procedure. Further recommendations will be made based on the findings of the coronary angiography. In the interim. The patient has any significant symptoms he knows to go to the nearest emergency room. 2. Essential hypertension: Blood pressure stable 3. Mixed dyslipidemia: Diet was emphasized he promises to do better.  Lipids were reviewed. 4. Morbid obesity: Risks of obesity explained and he promises to diet better after this procedure and take better care of himself. 5. Patient will be seen in follow-up appointment in 6 months or earlier if the patient has any concerns    Medication Adjustments/Labs and Tests Ordered: Current medicines are reviewed at length with the patient today.  Concerns regarding medicines are outlined above.  No orders of the defined types were placed in this encounter.  No orders of the defined types were placed in this encounter.    No chief complaint on file.    History of Present Illness:    Gary Grimes is a 59  y.o. male.  Patient has past medical history of essential hypertension dyslipidemia obstructive sleep apnea.  He has been complaining of chest tightness.  This is of concern to him.  He is used nitroglycerin with relief.  No orthopnea or PND.  He leads a fairly active lifestyle.  He is unable to give much history as per exertional component.  No radiation to the neck or to the arms.  He was scheduled for a CT coronary angiography with FFR but this has taken a significant.  Of time.  Patient comes for follow-up today.  He is on ranolazine and tolerating it well.  He wants to be evaluated for coronary angiography as he is very concerned about his chest pain symptoms.  This is conventional coronary angiography.  At the time of my evaluation, the patient is alert awake oriented and in no distress.  Past Medical History:  Diagnosis Date  . Achilles tendon contracture, left   . Acquired equinus deformity of both feet 10/22/2019  . ADHD   . Anginal pain ([place])    ER visit 01/14/2014 visit on chart   . Anxiety    pt denies  . Arthritis   . Bipolar 1 disorder ([place]) 01/15/2020  . Bipolar disorder ([place])   . Bipolar disorder, manic ([place]) 05/29/2016  . Chronic a-fib ([place]) 06/06/2018  . Chronic insomnia 08/01/2020  . Chronic lower back pain   . Chronic pansinusitis 04/28/2019  . Constipation   . Controlled type 2 diabetes mellitus, with long-term current use of insulin ([place])  06/06/2018  . 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/19/2020  . Depression    did get seen in er 4/13 for evaluation-psyc situational none recent  . DOE (dyspnea on exertion) 11/29/2019  . Essential hypertension 06/06/2018  . GERD (gastroesophageal reflux disease)   . Headache    none recent  . High cholesterol   . High triglycerides   . HLD (hyperlipidemia) 01/15/2020  . Hypertension   . Increased body mass index 09/19/2020  . Joint pain   . Levator syndrome 2001   history   . Lumbar pain 09/19/2008   Qualifier: Diagnosis of  By:  Aline Brochure MD, Dorothyann Peng    . Lumbar radiculopathy 09/19/2020  . 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 Mild CAD 05/17/2019  . Neuropathy   . OSA (obstructive sleep apnea) 01/15/2020  . OSA on CPAP   . Overweight 06/06/2018  . Pain in thoracic spine 09/19/2008   Qualifier: Diagnosis of  By: Aline Brochure MD, Dorothyann Peng    . Panic attacks   . Perirectal abscess   . Plantar fasciitis of left foot 02/28/2019  . Prediabetes   . Preoperative cardiovascular examination 05/17/2019  . S/P ablation of atrial fibrillation   . S/P nasal septoplasty 05/31/2019  . Sleep apnea 06/06/2018  . SPONDYLOSIS 09/19/2008   Qualifier: Diagnosis of  By: Aline Brochure MD, Dorothyann Peng    . Type 1 diabetes mellitus ([place]) 09/19/2020  . Type II diabetes mellitus ([place])   . Type II diabetes mellitus with neurological manifestations ([place]) 02/28/2019  . Ventral incisional hernia 01/29/2015  . Vitamin D deficiency   . Work related injury 09/19/2020    Past Surgical History:  Procedure Laterality Date  . ANAL FISSURE REPAIR  08/05/2000   proctoscopy  . APPENDECTOMY  1984  . ATRIAL FIBRILLATION ABLATION  10/28/2018  . ATRIAL FIBRILLATION ABLATION N/A 10/28/2018   Procedure: ATRIAL FIBRILLATION ABLATION;  Surgeon: Constance Haw, MD;  Location: [facility];  Service: Cardiovascular;  Laterality: N/A;  . BIOPSY  05/24/2019   Procedure: BIOPSY;  Surgeon: Ronald Lobo, MD;  [facility];  Service: Endoscopy;;  . BIOPSY  08/10/2019   Procedure: BIOPSY;  Surgeon: Ronald Lobo, MD;  [facility];  Service: Endoscopy;;  . COLONOSCOPY  2011  . COLONOSCOPY WITH PROPOFOL N/A 08/10/2019   Procedure: COLONOSCOPY WITH PROPOFOL;  Surgeon: Ronald Lobo, MD;  [facility];  Service: Endoscopy;  Laterality: N/A;  . ESOPHAGOGASTRODUODENOSCOPY (EGD) WITH PROPOFOL N/A 05/24/2019   Procedure: ESOPHAGOGASTRODUODENOSCOPY (EGD) WITH PROPOFOL;  Surgeon: Ronald Lobo, MD;   [facility];  Service: Endoscopy;  Laterality: N/A;  . GASTROCNEMIUS RECESSION Left 11/03/2019   Procedure: LEFT GASTROCNEMIUS RECESSION;  Surgeon: Newt Minion, MD;  Location: [facility];  Service: Orthopedics;  Laterality: Left;  . HERNIA REPAIR    . INSERTION OF MESH N/A 01/29/2015   Procedure: INSERTION OF MESH;  Surgeon: Excell Seltzer, MD;  [facility];  Service: General;  Laterality: N/A;  . IRRIGATION AND DEBRIDEMENT ABSCESS  02/18/2012   peri-rectal  . LEFT HEART CATH AND CORONARY ANGIOGRAPHY N/A 06/08/2018   Procedure: LEFT HEART CATH AND CORONARY ANGIOGRAPHY;  Surgeon: Leonie Man, MD;  Location: [facility];  Service: Cardiovascular;  Laterality: N/A;  . NASAL SEPTOPLASTY W/ TURBINOPLASTY  05/31/2019  . NASAL SEPTOPLASTY W/ TURBINOPLASTY Bilateral 05/31/2019   Procedure: NASAL SEPTOPLASTY WITH BILATERAL TURBINATE REDUCTION;  Surgeon: Leta Baptist, MD;  Location: [facility];  Service: ENT;  Laterality: Bilateral;  .  PLANTAR FASCIA RELEASE Left 11/03/2019   Procedure: PLANTAR FASCIA RELEASE LEFT FOOT;  Surgeon: Newt Minion, MD;  Location: [facility];  Service: Orthopedics;  Laterality: Left;  . POLYPECTOMY  08/10/2019   Procedure: POLYPECTOMY;  Surgeon: Ronald Lobo, MD;  [facility];  Service: Endoscopy;;  . SHOULDER ARTHROSCOPY Left ?2009   "repaired  AC joint; reattached bicept tendon"  . SHOULDER ARTHROSCOPY W/ LABRAL REPAIR Left 08/08/2007  . UMBILICAL HERNIA REPAIR  10/27/2010  . VENTRAL HERNIA REPAIR N/A 01/29/2015   Procedure: LAPAROSCOPIC VENTRAL HERNIA;  Surgeon: Excell Seltzer, MD;  [facility];  Service: General;  Laterality: N/A;    Current Medications: Current Meds  Medication Sig  . aspirin EC 81 MG tablet Take 1 tablet (81 mg total) by mouth daily. Swallow whole.  Marland Kitchen atorvastatin (LIPITOR) 10 MG tablet Take 10 mg by mouth daily.  . cariprazine (VRAYLAR) capsule Take by mouth daily.   . carvedilol (COREG) 3.125 MG tablet Take 3.125 mg  by mouth as needed.   Marland Kitchen HYDROcodone-acetaminophen (NORCO/VICODIN) 5-325 MG tablet Take 1 tablet by mouth every 6 (six) hours as needed for moderate pain.  . hydrOXYzine (ATARAX/VISTARIL) 25 MG tablet Take 25 mg by mouth at bedtime.  Marland Kitchen ibuprofen (IBU) 800 MG tablet Take 800 mg by mouth 3 (three) times daily as needed.  Marland Kitchen JARDIANCE 10 MG TABS tablet Take 10 mg by mouth daily.  . meloxicam (MOBIC) 7.5 MG tablet Take 1 tablet (7.5 mg total) by mouth daily.  . methocarbamol (ROBAXIN) 750 MG tablet Take 750 mg by mouth daily.  . nitroGLYCERIN (NITROSTAT) 0.4 MG SL tablet Place 0.4 mg under the tongue daily. Every 5 minutes for chest pain  . NOVOLIN 70/30 (70-30) 100 UNIT/ML injection Inject 36 Units into the skin 2 (two) times daily with a meal. Per sliding scale  . olmesartan (BENICAR) 40 MG tablet Take 40 mg by mouth daily.  . pregabalin (LYRICA) 150 MG capsule Take 1 capsule (150 mg total) by mouth 2 (two) times daily.  . ranolazine (RANEXA) 1000 MG SR tablet Take 1 tablet (1,000 mg total) by mouth 2 (two) times daily.  . Semaglutide, 1 MG/DOSE, (OZEMPIC, 1 MG/DOSE,) 2 MG/1.5ML SOPN Inject 1 mg into the skin once a week.  . Suvorexant (BELSOMRA) 20 MG TABS Take 20 mg by mouth daily.   Marland Kitchen tiZANidine (ZANAFLEX) 4 MG tablet Take 4 mg by mouth as needed.   . traZODone (DESYREL) 100 MG tablet Take 200 mg by mouth at bedtime as needed for sleep.     Allergies:   Morphine   Social History   Socioeconomic History  . Marital status: Widowed    Spouse name: Not on file  . Number of children: Not on file  . Years of education: Not on file  . Highest education level: Not on file  Occupational History  . 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 Smoking status: Former Smoker    Packs/day: 0.50    Years: 1.00    Pack years: 0.50    Types: Cigarettes  . Smokeless tobacco: Never Used  . Tobacco comment: quit 1983  Vaping Use  . Vaping Use: Never used  Substance and Sexual Activity  .  Alcohol use: Not Currently    Comment: rare wine  . Drug use: Not Currently    Comment: not since 70'S  . Sexual activity: Not on file  Other Topics Concern  . Not on file  Social History Narrative  .  Not on file   Social Determinants of Health   Financial Resource Strain:   . Difficulty of Paying Living Expenses: Not on file  Food Insecurity:   . Worried About Charity fundraiser in the Last Year: Not on file  . Ran Out of Food in the Last Year: Not on file  Transportation Needs:   . Lack of Transportation (Medical): Not on file  . Lack of Transportation (Non-Medical): Not on file  Physical Activity:   . Days of Exercise per Week: Not on file  . Minutes of Exercise per Session: Not on file  Stress:   . Feeling of Stress : Not on file  Social Connections:   . Frequency of Communication with Friends and Family: Not on file  . Frequency of Social Gatherings with Friends and Family: Not on file  . Attends Religious Services: Not on file  . Active Member of Clubs or Organizations: Not on file  . Attends Archivist Meetings: Not on file  . Marital Status: Not on file     Family History: The patient's family history includes Anxiety disorder in his father; Bipolar disorder in his father; Breast cancer in his mother; Depression in his father; Diabetes in his father and mother; Heart disease in his father and mother; Hyperlipidemia in his father and mother; Hypertension in his father and mother; Obesity in his father and mother; Ovarian cancer in his mother; Sleep apnea in his father and mother.  ROS:   Please see the history of present illness.    All other systems reviewed and are negative.  EKGs/Labs/Other Studies Reviewed:    The following studies were reviewed today: I discussed my findings with the patient at extensive length.  He had a chest pain episode last time and I had evaluated him with a troponin the level of which was within normal limits but at the top of  the range.   Recent Labs: 08/06/2020: ALT 24; BUN 17; Creatinine, Ser 1.17; Hemoglobin 14.2; Platelets 269; Potassium 4.8; Sodium 141; TSH 1.450  Recent Lipid Panel    Component Value Date/Time   CHOL 152 08/06/2020 1240   TRIG 263 (H) 08/06/2020 1240   HDL 43 08/06/2020 1240   LDLCALC 67 08/06/2020 1240    Physical Exam:    VS:  BP 126/64   Pulse 72   Ht 5\' 11"  (1.803 m)   Wt 294 lb (133.4 kg)   SpO2 98%   BMI 41.00 kg/m     Wt Readings from Last 3 Encounters:  10/14/20 294 lb (133.4 kg)  10/02/20 292 lb (132.5 kg)  09/30/20 289 lb (131.1 kg)     GEN: Patient is in no acute distress HEENT: Normal NECK: No JVD; No carotid bruits LYMPHATICS: No lymphadenopathy CARDIAC: Hear sounds regular, 2/6 systolic murmur at the apex. RESPIRATORY:  Clear to auscultation without rales, wheezing or rhonchi  ABDOMEN: Soft, non-tender, non-distended MUSCULOSKELETAL:  No edema; No deformity  SKIN: Warm and dry NEUROLOGIC:  Alert and oriented x 3 PSYCHIATRIC:  Normal affect   Signed, Jenean Lindau, MD  10/14/2020 4:43 PM    [facility]

## 2020-10-14 NOTE — Patient Instructions (Signed)
Medication Instructions:  No medication changes. *If you need a refill on your cardiac medications before your next appointment, please call your pharmacy*   Lab Work: You had your labs completed today for your upcoming cath. If you have labs (blood work) drawn today and your tests are completely normal, you will receive your results only by:  [place] (if you have MyChart) OR  A paper copy in the mail If you have any lab test that is abnormal or we need to change your treatment, we will call you to review the results.   Testing/Procedures:    [facility] [place], [address] [place] [address] Dept: [phone] Loc: [facility]  10/14/2020  You are scheduled for a Cardiac Catheterization on Friday, October 22 with Dr. Peter Martinique.  1. Please arrive at the [facility] (Main Entrance A) at [facility]: [address] [place], [place] [address] at 5:30 AM (This time is two hours before your procedure to ensure your preparation). Free valet parking service is available.   Special note: Every effort is made to have your procedure done on time. Please understand that emergencies sometimes delay scheduled procedures.  2. Diet: Do not eat solid foods after midnight.  The patient may have clear liquids until 5am upon the day of the procedure.  3. Labs: You had your labs done in the office on Monday.  4. Medication instructions in preparation for your procedure:   Contrast Allergy: No  Stop taking, Benicar (Olmesartan) Friday, October 22,2021 Stop taking, Advil or Motrin (Ibuprofen), Mobic (Meloxicam) Friday, October 22,2021  Take only 18 units of insulin the night before your procedure. Do not take any insulin on the day of the procedure.   On the morning of your procedure, take your Aspirin and any morning medicines NOT listed above.  You may use sips of  water.  5. Plan for one night stay--bring personal belongings. 6. Bring a current list of your medications and current insurance cards. 7. You MUST have a responsible person to drive you home. 8. Someone MUST be with you the first 24 hours after you arrive home or your discharge will be delayed. 9. Please wear clothes that are easy to get on and off and wear slip-on shoes.  Thank you for allowing [name] to care for you!   -- [facility]    Follow-Up: At [facility], you and your health needs are our priority.  As part of our continuing mission to provide you with exceptional heart care, we have created designated Provider Care Teams.  These Care Teams include your primary Cardiologist (physician) and Advanced Practice Providers (APPs -  Physician Assistants and Nurse Practitioners) who all work together to provide you with the care you need, when you need it.  We recommend signing up for the patient portal called "MyChart".  Sign up information is provided on this After Visit Summary.  MyChart is used to connect with patients for Virtual Visits (Telemedicine).  Patients are able to view lab/test results, encounter notes, upcoming appointments, etc.  Non-urgent messages can be sent to your provider as well.   To learn more about what you can do with MyChart, go to [email or web address].    Your next appointment:   1 month(s)  The format for your next appointment:   In Person  Provider:   Jyl Heinz, MD   Other Instructions  NA

## 2020-10-15 LAB — BASIC METABOLIC PANEL
BUN/Creatinine Ratio: 21 (ref 10–24)
BUN: 29 mg/dL — ABNORMAL HIGH (ref 8–27)
CO2: 24 mmol/L (ref 20–29)
Calcium: 10.2 mg/dL (ref 8.6–10.2)
Chloride: 101 mmol/L (ref 96–106)
Creatinine, Ser: 1.35 mg/dL — ABNORMAL HIGH (ref 0.76–1.27)
GFR calc Af Amer: 65 mL/min/{1.73_m2} (ref >59)
GFR calc non Af Amer: 56 mL/min/{1.73_m2} — ABNORMAL LOW (ref >59)
Glucose: 155 mg/dL — ABNORMAL HIGH (ref 65–99)
Potassium: 4.2 mmol/L (ref 3.5–5.2)
Sodium: 138 mmol/L (ref 134–144)

## 2020-10-15 LAB — CBC WITH DIFFERENTIAL/PLATELET
Basophils Absolute: 0 10*3/uL (ref 0.0–0.2)
Basos: 0 %
EOS (ABSOLUTE): 0.1 10*3/uL (ref 0.0–0.4)
Eos: 1 %
Hematocrit: 40.8 % (ref 37.5–51.0)
Hemoglobin: 14.1 g/dL (ref 13.0–17.7)
Immature Grans (Abs): 0.1 10*3/uL (ref 0.0–0.1)
Immature Granulocytes: 1 %
Lymphocytes Absolute: 4.3 10*3/uL — ABNORMAL HIGH (ref 0.7–3.1)
Lymphs: 48 %
MCH: 31.7 pg (ref 26.6–33.0)
MCHC: 34.6 g/dL (ref 31.5–35.7)
MCV: 92 fL (ref 79–97)
Monocytes Absolute: 0.9 10*3/uL (ref 0.1–0.9)
Monocytes: 9 %
Neutrophils Absolute: 3.9 10*3/uL (ref 1.4–7.0)
Neutrophils: 41 %
Platelets: 254 10*3/uL (ref 150–450)
RBC: 4.45 x10E6/uL (ref 4.14–5.80)
RDW: 13.2 % (ref 11.6–15.4)
WBC: 9.3 10*3/uL (ref 3.4–10.8)

## 2020-10-16 ENCOUNTER — Other Ambulatory Visit (HOSPITAL_COMMUNITY)
Admission: RE | Admit: 2020-10-16 | Discharge: 2020-10-16 | Disposition: A | Discharge status: Home / Self Care | Payer: No Typology Code available for payment source | Source: Ambulatory Visit | Attending: Cardiology | Admitting: Cardiology

## 2020-10-16 DIAGNOSIS — Z20822 Contact with and (suspected) exposure to covid-19: Secondary | ICD-10-CM | POA: Insufficient documentation

## 2020-10-16 DIAGNOSIS — Z01812 Encounter for preprocedural laboratory examination: Secondary | ICD-10-CM | POA: Diagnosis not present

## 2020-10-16 LAB — SARS CORONAVIRUS 2 (TAT 6-24 HRS): SARS Coronavirus 2: NEGATIVE

## 2020-10-17 ENCOUNTER — Telehealth: Payer: Self-pay | Admitting: *Deleted

## 2020-10-17 ENCOUNTER — Telehealth: Payer: Self-pay | Admitting: Cardiology

## 2020-10-17 NOTE — Telephone Encounter (Signed)
LMTCB to review procedure instructions 

## 2020-10-17 NOTE — Telephone Encounter (Addendum)
Pt contacted pre-catheterization scheduled at [facility] for: Friday October 18, 2020 7:30 AM Verified arrival time and place: [facility]) at: 5:30 AM   No solid food after midnight prior to cath, clear liquids until 5 AM day of procedure.  Hold: Jardiance-AM of procedure Insulin-AM of procedure/1/2 HS prior to procedure Olmesartan-day before and day of procedure-GFR 56 Mobic/ibuprofen-day before and day of procedure-GFR 56   Except hold medications AM meds can be  taken pre-cath with sips of water including: ASA 81 mg   Confirmed patient has responsible adult to drive home post procedure and be with patient first 24 hours after arriving home: yes  You are allowed ONE visitor in the waiting room during the time you are at the hospital for your procedure. Both you and your visitor must wear a mask once you enter the hospital.       COVID-19 Pre-Screening Questions:  . In the past 14 days have you had a new cough, new headache, new nasal congestion, fever (100.4 or greater) unexplained body aches, new sore throat, or sudden loss of taste or sense of smell? no . In the past 14 days have you been around anyone with known Covid 19? no . Have you been vaccinated for COVID-19? Yes, see immunization history   Reviewed procedure/mask/visitor instructions, COVID-19 questions with patient.

## 2020-10-17 NOTE — Telephone Encounter (Signed)
Patient is returning call to go over review for his procedure. Please call

## 2020-10-17 NOTE — Telephone Encounter (Signed)
Gary Grimes is calling wanting to know if it would be okay for him to take Gary Grimes to a concert on Saturday [birth year]/58 at 7:30pm due to him having surgery tomorrow. He states he wanted to get the okay for him to go before telling him about it incase he shouldn't. Please advise.

## 2020-10-17 NOTE — Telephone Encounter (Signed)
Reviewed procedure/mask/visitor instructions, COVID-19 questions with patient. 

## 2020-10-17 NOTE — Telephone Encounter (Signed)
Returned call to patient's brother [name].Advised to ask Dr.Jordan after cardiac cath tomorrow it ok to go to concert.

## 2020-10-17 NOTE — Telephone Encounter (Signed)
Done, see other phone note 10/17/20

## 2020-10-18 ENCOUNTER — Encounter (HOSPITAL_COMMUNITY)
Admission: RE | Disposition: A | Discharge status: Home / Self Care | Payer: Self-pay | Source: Home / Self Care | Attending: Cardiology

## 2020-10-18 ENCOUNTER — Ambulatory Visit (HOSPITAL_COMMUNITY)
Admission: RE | Admit: 2020-10-18 | Discharge: 2020-10-18 | Disposition: A | Discharge status: Home / Self Care | Payer: PRIVATE HEALTH INSURANCE | Attending: Cardiology | Admitting: Cardiology

## 2020-10-18 ENCOUNTER — Other Ambulatory Visit: Payer: Self-pay

## 2020-10-18 DIAGNOSIS — Z87891 Personal history of nicotine dependence: Secondary | ICD-10-CM | POA: Insufficient documentation

## 2020-10-18 DIAGNOSIS — I209 Angina pectoris, unspecified: Secondary | ICD-10-CM

## 2020-10-18 DIAGNOSIS — I25119 Atherosclerotic heart disease of native coronary artery with unspecified angina pectoris: Secondary | ICD-10-CM | POA: Diagnosis not present

## 2020-10-18 DIAGNOSIS — Z794 Long term (current) use of insulin: Secondary | ICD-10-CM | POA: Insufficient documentation

## 2020-10-18 DIAGNOSIS — K219 Gastro-esophageal reflux disease without esophagitis: Secondary | ICD-10-CM | POA: Insufficient documentation

## 2020-10-18 DIAGNOSIS — Z6841 Body Mass Index (BMI) 40.0 and over, adult: Secondary | ICD-10-CM | POA: Insufficient documentation

## 2020-10-18 DIAGNOSIS — Z79899 Other long term (current) drug therapy: Secondary | ICD-10-CM | POA: Insufficient documentation

## 2020-10-18 DIAGNOSIS — E1140 Type 2 diabetes mellitus with diabetic neuropathy, unspecified: Secondary | ICD-10-CM

## 2020-10-18 DIAGNOSIS — F319 Bipolar disorder, unspecified: Secondary | ICD-10-CM | POA: Insufficient documentation

## 2020-10-18 DIAGNOSIS — I4891 Unspecified atrial fibrillation: Secondary | ICD-10-CM | POA: Insufficient documentation

## 2020-10-18 DIAGNOSIS — E559 Vitamin D deficiency, unspecified: Secondary | ICD-10-CM | POA: Insufficient documentation

## 2020-10-18 DIAGNOSIS — G4733 Obstructive sleep apnea (adult) (pediatric): Secondary | ICD-10-CM | POA: Insufficient documentation

## 2020-10-18 DIAGNOSIS — E119 Type 2 diabetes mellitus without complications: Secondary | ICD-10-CM

## 2020-10-18 DIAGNOSIS — E782 Mixed hyperlipidemia: Secondary | ICD-10-CM | POA: Insufficient documentation

## 2020-10-18 DIAGNOSIS — Z7982 Long term (current) use of aspirin: Secondary | ICD-10-CM | POA: Insufficient documentation

## 2020-10-18 DIAGNOSIS — I10 Essential (primary) hypertension: Secondary | ICD-10-CM | POA: Insufficient documentation

## 2020-10-18 DIAGNOSIS — E1149 Type 2 diabetes mellitus with other diabetic neurological complication: Secondary | ICD-10-CM

## 2020-10-18 DIAGNOSIS — E7800 Pure hypercholesterolemia, unspecified: Secondary | ICD-10-CM | POA: Insufficient documentation

## 2020-10-18 HISTORY — PX: LEFT HEART CATH AND CORONARY ANGIOGRAPHY: CATH118249

## 2020-10-18 LAB — GLUCOSE, CAPILLARY: Glucose-Capillary: 176 mg/dL — ABNORMAL HIGH (ref 70–99)

## 2020-10-18 SURGERY — LEFT HEART CATH AND CORONARY ANGIOGRAPHY
Anesthesia: LOCAL

## 2020-10-18 MED ORDER — VERAPAMIL HCL 2.5 MG/ML IV SOLN
INTRAVENOUS | Status: AC
  Filled 2020-10-18: qty 2

## 2020-10-18 MED ORDER — SODIUM CHLORIDE 0.9% FLUSH: 3.0000 mL | Freq: Two times a day (BID) | INTRAVENOUS | Status: DC

## 2020-10-18 MED ORDER — LIDOCAINE HCL (PF) 1 % IJ SOLN
INTRAMUSCULAR | Status: DC | PRN
  Administered 2020-10-18: 2 mL

## 2020-10-18 MED ORDER — SODIUM CHLORIDE 0.9 % IV SOLN: 250.0000 mL | INTRAVENOUS | Status: DC | PRN

## 2020-10-18 MED ORDER — VERAPAMIL HCL 2.5 MG/ML IV SOLN
INTRAVENOUS | Status: DC | PRN
  Administered 2020-10-18: 10 mL via INTRA_ARTERIAL

## 2020-10-18 MED ORDER — ACETAMINOPHEN 325 MG PO TABS: 650.0000 mg | ORAL_TABLET | ORAL | Status: DC | PRN

## 2020-10-18 MED ORDER — SODIUM CHLORIDE 0.9% FLUSH: 3.0000 mL | INTRAVENOUS | Status: DC | PRN

## 2020-10-18 MED ORDER — MIDAZOLAM HCL 2 MG/2ML IJ SOLN
INTRAMUSCULAR | Status: DC | PRN
  Administered 2020-10-18: 1 mg via INTRAVENOUS

## 2020-10-18 MED ORDER — SODIUM CHLORIDE 0.9 % WEIGHT BASED INFUSION: 3.0000 mL/kg/h | INTRAVENOUS | Status: AC

## 2020-10-18 MED ORDER — LIDOCAINE HCL (PF) 1 % IJ SOLN
INTRAMUSCULAR | Status: AC
  Filled 2020-10-18: qty 30

## 2020-10-18 MED ORDER — HEPARIN SODIUM (PORCINE) 1000 UNIT/ML IJ SOLN
INTRAMUSCULAR | Status: AC
  Filled 2020-10-18: qty 1

## 2020-10-18 MED ORDER — IOHEXOL 350 MG/ML SOLN
INTRAVENOUS | Status: DC | PRN
  Administered 2020-10-18: 70 mL

## 2020-10-18 MED ORDER — MIDAZOLAM HCL 2 MG/2ML IJ SOLN
INTRAMUSCULAR | Status: AC
  Filled 2020-10-18: qty 2

## 2020-10-18 MED ORDER — SODIUM CHLORIDE 0.9 % WEIGHT BASED INFUSION: 1.0000 mL/kg/h | INTRAVENOUS | Status: DC

## 2020-10-18 MED ORDER — FENTANYL CITRATE (PF) 100 MCG/2ML IJ SOLN
INTRAMUSCULAR | Status: AC
  Filled 2020-10-18: qty 2

## 2020-10-18 MED ORDER — HEPARIN SODIUM (PORCINE) 1000 UNIT/ML IJ SOLN
INTRAMUSCULAR | Status: DC | PRN
  Administered 2020-10-18: 6000 [IU] via INTRAVENOUS

## 2020-10-18 MED ORDER — HYDRALAZINE HCL 20 MG/ML IJ SOLN: 10.0000 mg | INTRAMUSCULAR | Status: DC | PRN

## 2020-10-18 MED ORDER — HEPARIN (PORCINE) IN NACL 1000-0.9 UT/500ML-% IV SOLN
INTRAVENOUS | Status: AC
  Filled 2020-10-18: qty 1000

## 2020-10-18 MED ORDER — ONDANSETRON HCL 4 MG/2ML IJ SOLN: 4.0000 mg | Freq: Four times a day (QID) | INTRAMUSCULAR | Status: DC | PRN

## 2020-10-18 MED ORDER — HEPARIN (PORCINE) IN NACL 1000-0.9 UT/500ML-% IV SOLN
INTRAVENOUS | Status: DC | PRN
  Administered 2020-10-18 (×2): 500 mL

## 2020-10-18 MED ORDER — FENTANYL CITRATE (PF) 100 MCG/2ML IJ SOLN
INTRAMUSCULAR | Status: DC | PRN
  Administered 2020-10-18: 25 ug via INTRAVENOUS

## 2020-10-18 MED ORDER — SODIUM CHLORIDE 0.9 % WEIGHT BASED INFUSION
1.0000 mL/kg/h | INTRAVENOUS | Status: DC
  Administered 2020-10-18: 1 mL/kg/h via INTRAVENOUS

## 2020-10-18 MED ORDER — ASPIRIN 81 MG PO CHEW: 81.0000 mg | CHEWABLE_TABLET | ORAL | Status: DC

## 2020-10-18 SURGICAL SUPPLY — 10 items
CATH 5FR JL3.5 JR4 ANG PIG MP (CATHETERS) ×1 IMPLANT
DEVICE RAD COMP TR BAND LRG (VASCULAR PRODUCTS) ×1 IMPLANT
GLIDESHEATH SLEND SS 6F .021 (SHEATH) ×1 IMPLANT
GUIDEWIRE INQWIRE 1.5J.035X260 (WIRE) IMPLANT
INQWIRE 1.5J .035X260CM (WIRE) ×2
KIT HEART LEFT (KITS) ×2 IMPLANT
PACK CARDIAC CATHETERIZATION (CUSTOM PROCEDURE TRAY) ×2 IMPLANT
SYR MEDRAD MARK 7 150ML (SYRINGE) ×2 IMPLANT
TRANSDUCER W/STOPCOCK (MISCELLANEOUS) ×2 IMPLANT
TUBING CIL FLEX 10 FLL-RA (TUBING) ×2 IMPLANT

## 2020-10-18 NOTE — Interval H&P Note (Signed)
History and Physical Interval Note:  10/18/2020 7:23 AM  Gary Grimes  has presented today for surgery, with the diagnosis of angina.  The various methods of treatment have been discussed with the patient and family. After consideration of risks, benefits and other options for treatment, the patient has consented to  Procedure(s): LEFT HEART CATH AND CORONARY ANGIOGRAPHY (N/A) as a surgical intervention.  The patient's history has been reviewed, patient examined, no change in status, stable for surgery.  I have reviewed the patient's chart and labs.  Questions were answered to the patient's satisfaction.   Cath Lab Visit (complete for each Cath Lab visit)  Clinical Evaluation Leading to the Procedure:   ACS: Yes.    Non-ACS:    Anginal Classification: CCS III  Anti-ischemic medical therapy: Maximal Therapy (2 or more classes of medications)  Non-Invasive Test Results: No non-invasive testing performed  Prior CABG: No previous CABG        [name] 10/18/2020 7:23 AM

## 2020-10-18 NOTE — Discharge Instructions (Signed)
Radial Site Care  This sheet gives you information about how to care for yourself after your procedure. Your health care provider may also give you more specific instructions. If you have problems or questions, contact your health care provider. What can I expect after the procedure? After the procedure, it is common to have:  Bruising and tenderness at the catheter insertion area. Follow these instructions at home: Medicines  Take over-the-counter and prescription medicines only as told by your health care provider. Insertion site care  Follow instructions from your health care provider about how to take care of your insertion site. Make sure you: ? Wash your hands with soap and water before you change your bandage (dressing). If soap and water are not available, use hand sanitizer. ? Change your dressing as told by your health care provider. ? Leave stitches (sutures), skin glue, or adhesive strips in place. These skin closures may need to stay in place for 2 weeks or longer. If adhesive strip edges start to loosen and curl up, you may trim the loose edges. Do not remove adhesive strips completely unless your health care provider tells you to do that.  Check your insertion site every day for signs of infection. Check for: ? Redness, swelling, or pain. ? Fluid or blood. ? Pus or a bad smell. ? Warmth.  Do not take baths, swim, or use a hot tub until your health care provider approves.  You may shower 24-48 hours after the procedure, or as directed by your health care provider. ? Remove the dressing and gently wash the site with plain soap and water. ? Pat the area dry with a clean towel. ? Do not rub the site. That could cause bleeding.  Do not apply powder or lotion to the site. Activity   For 24 hours after the procedure, or as directed by your health care provider: ? Do not flex or bend the affected arm. ? Do not push or pull heavy objects with the affected arm. ? Do not  drive yourself home from the hospital or clinic. You may drive 24 hours after the procedure unless your health care provider tells you not to. ? Do not operate machinery or power tools.  Do not lift anything that is heavier than 10 lb (4.5 kg), or the limit that you are told, until your health care provider says that it is safe.  Ask your health care provider when it is okay to: ? Return to work or school. ? Resume usual physical activities or sports. ? Resume sexual activity. General instructions  If the catheter site starts to bleed, raise your arm and put firm pressure on the site. If the bleeding does not stop, get help right away. This is a medical emergency.  If you went home on the same day as your procedure, a responsible adult should be with you for the first 24 hours after you arrive home.  Keep all follow-up visits as told by your health care provider. This is important. Contact a health care provider if:  You have a fever.  You have redness, swelling, or yellow drainage around your insertion site. Get help right away if:  You have unusual pain at the radial site.  The catheter insertion area swells very fast.  The insertion area is bleeding, and the bleeding does not stop when you hold steady pressure on the area.  Your arm or hand becomes pale, cool, tingly, or numb. These symptoms may represent a serious problem   that is an emergency. Do not wait to see if the symptoms will go away. Get medical help right away. Call your local emergency services (911 in the [place] Do not drive yourself to the hospital. Summary  After the procedure, it is common to have bruising and tenderness at the site.  Follow instructions from your health care provider about how to take care of your radial site wound. Check the wound every day for signs of infection.  Do not lift anything that is heavier than 10 lb (4.5 kg), or the limit that you are told, until your health care provider says  that it is safe. This information is not intended to replace advice given to you by your health care provider. Make sure you discuss any questions you have with your health care provider. Document Revised: 01/19/2018 Document Reviewed: 01/19/2018 Elsevier Patient Education  2020 [employer]  

## 2020-10-18 NOTE — Progress Notes (Signed)
Discharge instructions reviewed with patient and family. Verbalized understnading. 

## 2020-10-21 ENCOUNTER — Encounter (HOSPITAL_COMMUNITY): Payer: Self-pay | Admitting: Cardiology

## 2020-10-24 ENCOUNTER — Ambulatory Visit (INDEPENDENT_AMBULATORY_CARE_PROVIDER_SITE_OTHER): Payer: PRIVATE HEALTH INSURANCE | Admitting: Family Medicine

## 2020-10-31 ENCOUNTER — Ambulatory Visit (INDEPENDENT_AMBULATORY_CARE_PROVIDER_SITE_OTHER): Payer: PRIVATE HEALTH INSURANCE | Admitting: Family Medicine

## 2020-11-27 ENCOUNTER — Ambulatory Visit: Payer: PRIVATE HEALTH INSURANCE | Admitting: Cardiology

## 2020-11-28 ENCOUNTER — Telehealth: Payer: Self-pay | Admitting: Primary Care

## 2020-11-28 NOTE — Telephone Encounter (Signed)
Called and spoke with pt and he stated that he wants to stop the [place] and go back to the [place].  He stated that he cannot sleep at all with the [place].  BW please advise. Thanks    Allergies  Allergen Reactions  . Morphine Other (See Comments)    PT BECAME DELIRIOUS  PT BECAME DELIRIOUS  PT BECAME DELIRIOUS    Current Outpatient Medications on File Prior to Visit  Medication Sig Dispense Refill  . aspirin EC 81 MG tablet Take 1 tablet (81 mg total) by mouth daily. Swallow whole. 90 tablet 3  . atorvastatin (LIPITOR) 10 MG tablet Take 10 mg by mouth daily.    . cariprazine (VRAYLAR) capsule Take by mouth daily.     . carvedilol (COREG) 3.125 MG tablet Take 3.125 mg by mouth as needed.     Marland Kitchen HYDROcodone-acetaminophen (NORCO/VICODIN) 5-325 MG tablet Take 1 tablet by mouth every 6 (six) hours as needed for moderate pain. 30 tablet 0  . hydrOXYzine (ATARAX/VISTARIL) 25 MG tablet Take 25 mg by mouth at bedtime.    Marland Kitchen JARDIANCE 10 MG TABS tablet Take 10 mg by mouth daily.    . meloxicam (MOBIC) 7.5 MG tablet Take 1 tablet (7.5 mg total) by mouth daily. 30 tablet 5  . methocarbamol (ROBAXIN) 750 MG tablet Take 750 mg by mouth daily.    . nitroGLYCERIN (NITROSTAT) 0.4 MG SL tablet Place 0.4 mg under the tongue daily. Every 5 minutes for chest pain    . NOVOLIN 70/30 (70-30) 100 UNIT/ML injection Inject 36 Units into the skin 2 (two) times daily with a meal. Per sliding scale    . olmesartan (BENICAR) 40 MG tablet Take 40 mg by mouth daily.    . pregabalin (LYRICA) 150 MG capsule Take 1 capsule (150 mg total) by mouth 2 (two) times daily. 60 capsule 2  . ranolazine (RANEXA) 1000 MG SR tablet Take 1 tablet (1,000 mg total) by mouth 2 (two) times daily. 60 tablet 3  . Semaglutide, 1 MG/DOSE, (OZEMPIC, 1 MG/DOSE,) 2 MG/1.5ML SOPN Inject 1 mg into the skin once a week.    . Suvorexant (BELSOMRA) 20 MG TABS Take 20 mg by mouth daily.     Marland Kitchen tiZANidine (ZANAFLEX) 4 MG tablet Take 4 mg by mouth as  needed.     . traZODone (DESYREL) 100 MG tablet Take 200 mg by mouth at bedtime as needed for sleep.     No current facility-administered medications on file prior to visit.

## 2020-11-29 MED ORDER — BELSOMRA 20 MG PO TABS: 20.0000 mg | ORAL_TABLET | Freq: Every day | ORAL | 1 refills | Status: DC

## 2020-11-29 NOTE — Telephone Encounter (Signed)
Yes that is fine. Looks like he filled Belsomra in October. I will send in script and remove ambien. He should not take with pain medication or muscle relaxor. Please remove from lisit if he not longer has Norco and Tazanidine prescription. Needs FU with Dr. Ander Slade in February.

## 2020-11-29 NOTE — Telephone Encounter (Signed)
Called and spoke with pt letting him know that Eustaquio Maize was fine with him switching back to the Belsomra from the Ambien. Asked him if he was still taking the norco or the tizanidine as Eustaquio Maize stated that neither of these meds should be taken if on the belsomra and pt stated that he was not. I have removed these off of his med list.  Pt stated that he was needing to have a refill of the belsomra sent to pharmacy for him. Rx has been sent to pharmacy for pt by [facility] today 12/3. Pt has been made aware of this. Nothing further needed.

## 2020-12-11 ENCOUNTER — Ambulatory Visit (INDEPENDENT_AMBULATORY_CARE_PROVIDER_SITE_OTHER): Payer: PRIVATE HEALTH INSURANCE | Admitting: Family Medicine

## 2020-12-29 ENCOUNTER — Other Ambulatory Visit: Payer: Self-pay | Admitting: Cardiology

## 2021-01-22 ENCOUNTER — Ambulatory Visit: Payer: PRIVATE HEALTH INSURANCE | Admitting: Cardiology

## 2021-01-26 ENCOUNTER — Other Ambulatory Visit: Payer: Self-pay | Admitting: Primary Care

## 2021-01-27 NOTE — Telephone Encounter (Signed)
Dr. O, please advise if you are okay refilling med. 

## 2021-01-28 NOTE — Telephone Encounter (Signed)
Called and left detailed message for pt that med was refilled and to call back if he has any questions or concerns. Encounter is already closed, will sign addendum.

## 2021-01-28 NOTE — Telephone Encounter (Signed)
Just refilled his medications again  He can call the pharmacy to make sure they have it

## 2021-02-10 ENCOUNTER — Ambulatory Visit (INDEPENDENT_AMBULATORY_CARE_PROVIDER_SITE_OTHER): Payer: PRIVATE HEALTH INSURANCE | Admitting: Podiatry

## 2021-02-10 ENCOUNTER — Other Ambulatory Visit: Payer: Self-pay

## 2021-02-10 DIAGNOSIS — M722 Plantar fascial fibromatosis: Secondary | ICD-10-CM

## 2021-02-10 DIAGNOSIS — E1149 Type 2 diabetes mellitus with other diabetic neurological complication: Secondary | ICD-10-CM

## 2021-02-10 DIAGNOSIS — E119 Type 2 diabetes mellitus without complications: Secondary | ICD-10-CM

## 2021-02-10 MED ORDER — TRIAMCINOLONE ACETONIDE 10 MG/ML IJ SUSP
10.0000 mg | Freq: Once | INTRAMUSCULAR | Status: AC
  Administered 2021-02-10: 10 mg

## 2021-02-14 NOTE — Progress Notes (Signed)
Subjective: 60 year old male presents the office today for concerns of recurrent plantar fasciitis is asking for steroid injections bilaterally.  He states he was doing well however the pain started to come back.  No recent injury or falls or changes otherwise. Denies any systemic complaints such as fevers, chills, nausea, vomiting. No acute changes since last appointment, and no other complaints at this time.   Denies any ulcerations or claudication symptoms.  Last A1c was 8.4 on August 06, 2020.  Objective: AAO x3, NAD DP/PT pulses palpable bilaterally, CRT less than 3 seconds Sensation decreased with Semmes Weinstein monofilament. There is tenderness palpation of plantar medial tubercle of the calcaneus at insertion of plantar fascial bilaterally.  Plantar fascial appears to be intact.  No pain about compression of calcaneus.  No edema, erythema.  MMT 5/5.  Negative Tinel's sign.  No area pinpoint tenderness identified otherwise. No pain with calf compression, swelling, warmth, erythema  Assessment: 60 year old male bilateral plantar fasciitis  Plan: -All treatment options discussed with the patient including all alternatives, risks, complications.  -Steroid injection performed bilaterally.  See procedure note below.  Continue stretching, icing daily.  Discussed shoe modifications, good arch supports.  Discussed other treatment options to pain continues to recur. -Patient encouraged to call the office with any questions, concerns, change in symptoms.   Procedure: Injection Tendon/Ligament Discussed alternatives, risks, complications and verbal consent was obtained.  Location: Bilateral plantar fascia at the glabrous junction; medial approach. Skin Prep: Alcohol. Injectate: 0.5cc 0.5% marcaine plain, 0.5 cc 2% lidocaine plain and, 1 cc kenalog 10. Disposition: Patient tolerated procedure well. Injection site dressed with a band-aid.  Post-injection care was discussed and return  precautions discussed.   No follow-ups on file.  Trula Slade DPM

## 2021-02-28 ENCOUNTER — Emergency Department (HOSPITAL_COMMUNITY): Payer: No Typology Code available for payment source

## 2021-02-28 ENCOUNTER — Other Ambulatory Visit: Payer: Self-pay

## 2021-02-28 ENCOUNTER — Emergency Department (HOSPITAL_COMMUNITY)
Admission: EM | Admit: 2021-02-28 | Discharge: 2021-02-28 | Disposition: A | Discharge status: Home / Self Care | Payer: No Typology Code available for payment source | Attending: Emergency Medicine | Admitting: Emergency Medicine

## 2021-02-28 ENCOUNTER — Encounter (HOSPITAL_COMMUNITY): Payer: Self-pay

## 2021-02-28 DIAGNOSIS — Z79899 Other long term (current) drug therapy: Secondary | ICD-10-CM | POA: Diagnosis not present

## 2021-02-28 DIAGNOSIS — R110 Nausea: Secondary | ICD-10-CM | POA: Insufficient documentation

## 2021-02-28 DIAGNOSIS — I2510 Atherosclerotic heart disease of native coronary artery without angina pectoris: Secondary | ICD-10-CM | POA: Diagnosis not present

## 2021-02-28 DIAGNOSIS — R1011 Right upper quadrant pain: Secondary | ICD-10-CM | POA: Diagnosis not present

## 2021-02-28 DIAGNOSIS — Z794 Long term (current) use of insulin: Secondary | ICD-10-CM | POA: Insufficient documentation

## 2021-02-28 DIAGNOSIS — R748 Abnormal levels of other serum enzymes: Secondary | ICD-10-CM | POA: Diagnosis not present

## 2021-02-28 DIAGNOSIS — R5383 Other fatigue: Secondary | ICD-10-CM | POA: Insufficient documentation

## 2021-02-28 DIAGNOSIS — E1040 Type 1 diabetes mellitus with diabetic neuropathy, unspecified: Secondary | ICD-10-CM | POA: Diagnosis not present

## 2021-02-28 DIAGNOSIS — Z7982 Long term (current) use of aspirin: Secondary | ICD-10-CM | POA: Insufficient documentation

## 2021-02-28 DIAGNOSIS — Z87891 Personal history of nicotine dependence: Secondary | ICD-10-CM | POA: Insufficient documentation

## 2021-02-28 DIAGNOSIS — I10 Essential (primary) hypertension: Secondary | ICD-10-CM | POA: Insufficient documentation

## 2021-02-28 LAB — CBC WITH DIFFERENTIAL/PLATELET
Abs Immature Granulocytes: 0.06 10*3/uL (ref 0.00–0.07)
Basophils Absolute: 0 10*3/uL (ref 0.0–0.1)
Basophils Relative: 1 %
Eosinophils Absolute: 0.2 10*3/uL (ref 0.0–0.5)
Eosinophils Relative: 2 %
HCT: 40.3 % (ref 39.0–52.0)
Hemoglobin: 13.8 g/dL (ref 13.0–17.0)
Immature Granulocytes: 1 %
Lymphocytes Relative: 40 %
Lymphs Abs: 3.3 10*3/uL (ref 0.7–4.0)
MCH: 31.3 pg (ref 26.0–34.0)
MCHC: 34.2 g/dL (ref 30.0–36.0)
MCV: 91.4 fL (ref 80.0–100.0)
Monocytes Absolute: 0.8 10*3/uL (ref 0.1–1.0)
Monocytes Relative: 10 %
Neutro Abs: 3.9 10*3/uL (ref 1.7–7.7)
Neutrophils Relative %: 46 %
Platelets: 238 10*3/uL (ref 150–400)
RBC: 4.41 MIL/uL (ref 4.22–5.81)
RDW: 15.1 % (ref 11.5–15.5)
WBC: 8.2 10*3/uL (ref 4.0–10.5)
nRBC: 0 % (ref 0.0–0.2)

## 2021-02-28 LAB — COMPREHENSIVE METABOLIC PANEL
ALT: 30 U/L (ref 0–44)
AST: 30 U/L (ref 15–41)
Albumin: 4.4 g/dL (ref 3.5–5.0)
Alkaline Phosphatase: 69 U/L (ref 38–126)
Anion gap: 9 (ref 5–15)
BUN: 26 mg/dL — ABNORMAL HIGH (ref 8–23)
CO2: 21 mmol/L — ABNORMAL LOW (ref 22–32)
Calcium: 9.7 mg/dL (ref 8.9–10.3)
Chloride: 104 mmol/L (ref 98–111)
Creatinine, Ser: 1.21 mg/dL (ref 0.61–1.24)
GFR, Estimated: >60 mL/min (ref >60)
Glucose, Bld: 135 mg/dL — ABNORMAL HIGH (ref 70–99)
Potassium: 4.4 mmol/L (ref 3.5–5.1)
Sodium: 134 mmol/L — ABNORMAL LOW (ref 135–145)
Total Bilirubin: 1.3 mg/dL — ABNORMAL HIGH (ref 0.3–1.2)
Total Protein: 6.9 g/dL (ref 6.5–8.1)

## 2021-02-28 LAB — LIPASE, BLOOD: Lipase: 91 U/L — ABNORMAL HIGH (ref 11–51)

## 2021-02-28 MED ORDER — SODIUM CHLORIDE 0.9 % IV BOLUS
1000.0000 mL | Freq: Once | INTRAVENOUS | Status: AC
  Administered 2021-02-28: 1000 mL via INTRAVENOUS

## 2021-02-28 MED ORDER — ONDANSETRON HCL 4 MG/2ML IJ SOLN
4.0000 mg | Freq: Once | INTRAMUSCULAR | Status: AC
  Administered 2021-02-28: 4 mg via INTRAVENOUS
  Filled 2021-02-28: qty 2

## 2021-02-28 MED ORDER — FENTANYL CITRATE (PF) 100 MCG/2ML IJ SOLN
25.0000 ug | Freq: Once | INTRAMUSCULAR | Status: AC
Start: 2021-02-28 — End: 2021-02-28
  Administered 2021-02-28: 25 ug via INTRAVENOUS
  Filled 2021-02-28: qty 2

## 2021-02-28 MED ORDER — IOHEXOL 300 MG/ML  SOLN
100.0000 mL | Freq: Once | INTRAMUSCULAR | Status: AC | PRN
  Administered 2021-02-28: 100 mL via INTRAVENOUS

## 2021-02-28 NOTE — ED Provider Notes (Signed)
[facility] DEPT Provider Note   CSN: [id number] Arrival date & time: 02/28/21  1117     History Chief Complaint  Patient presents with  . Side pain     Gary Grimes is a 60 y.o. male with a past medical history of bipolar disorder, chronic lower back pain, GERD, diabetes presenting to the ED with a chief complaint of right upper quadrant abdominal pain.  Reports 3 to 4-day history of right upper quadrant abdominal pain that worsened gradually.  Reports nausea and loose stools.  Denies any vomiting.  Reports overall feeling fatigued.  He does not know if the pain worsens after eating.  Has tried ibuprofen with some improvement in his symptoms last night.  States that he still has his gallbladder but was never told he had gallstones.  Denies any fevers, chest pain, shortness of breath.  Pain is worse with palpation.  No urinary symptoms.  HPI     Past Medical History:  Diagnosis Date  . Achilles tendon contracture, left   . Acquired equinus deformity of both feet 10/22/2019  . ADHD   . Anginal pain ([place])    ER visit 01/14/2014 visit on chart   . Anxiety    pt denies  . Arthritis   . Bipolar 1 disorder ([place]) 01/15/2020  . Bipolar disorder ([place])   . Bipolar disorder, manic ([place]) 05/29/2016  . Chronic a-fib ([place]) 06/06/2018  . Chronic insomnia 08/01/2020  . Chronic lower back pain   . Chronic pansinusitis 04/28/2019  . Constipation   . Controlled type 2 diabetes mellitus, with long-term current use of insulin ([place]) 06/06/2018  . 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/19/2020  . Depression    did get seen in er 4/13 for evaluation-psyc situational none recent  . DOE (dyspnea on exertion) 11/29/2019  . Essential hypertension 06/06/2018  . GERD (gastroesophageal reflux disease)   . Headache    none recent  . High cholesterol   . High triglycerides   . HLD (hyperlipidemia) 01/15/2020  . Hypertension   . Increased body mass index 09/19/2020  . Joint  pain   . Levator syndrome 2001   history   . Lumbar pain 09/19/2008   Qualifier: Diagnosis of  By: Aline Brochure MD, Dorothyann Peng    . Lumbar radiculopathy 09/19/2020  . 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 Mild CAD 05/17/2019  . Neuropathy   . OSA (obstructive sleep apnea) 01/15/2020  . OSA on CPAP   . Overweight 06/06/2018  . Pain in thoracic spine 09/19/2008   Qualifier: Diagnosis of  By: Aline Brochure MD, Dorothyann Peng    . Panic attacks   . Perirectal abscess   . Plantar fasciitis of left foot 02/28/2019  . Prediabetes   . Preoperative cardiovascular examination 05/17/2019  . S/P ablation of atrial fibrillation   . S/P nasal septoplasty 05/31/2019  . Sleep apnea 06/06/2018  . SPONDYLOSIS 09/19/2008   Qualifier: Diagnosis of  By: Aline Brochure MD, Dorothyann Peng    . Type 1 diabetes mellitus ([place]) 09/19/2020  . Type II diabetes mellitus ([place])   . 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 Ventral incisional hernia 01/29/2015  . Vitamin D deficiency   . Work related injury 09/19/2020    Patient Active Problem List   Diagnosis Date Noted  . Angina pectoris ([place]) 10/14/2020  . Bipolar disorder ([place])   . OSA on CPAP   . ADHD   . Anginal pain ([place])   .  Anxiety   . Arthritis   . Chronic lower back pain   . Constipation   . Depression   . Headache   . High cholesterol   . High triglycerides   . Joint pain   . Neuropathy   . Panic attacks   . Prediabetes   . Type II diabetes mellitus ([place])   . Vitamin D deficiency   . Work related injury 09/19/2020  . 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/19/2020  . Increased body mass index 09/19/2020  . Type 1 diabetes mellitus ([place]) 09/19/2020  . Lumbar radiculopathy 09/19/2020  . Chronic insomnia 08/01/2020  . Bipolar 1 disorder ([place]) 01/15/2020  . GERD (gastroesophageal reflux disease) 01/15/2020  . Hypertension 01/15/2020  . OSA (obstructive sleep apnea) 01/15/2020  . HLD  (hyperlipidemia) 01/15/2020  . DOE (dyspnea on exertion) 11/29/2019  . Achilles tendon contracture, left   . Acquired equinus deformity of both feet 10/22/2019  . S/P nasal septoplasty 05/31/2019  . Preoperative cardiovascular examination 05/17/2019  . Mild CAD 05/17/2019  . Chronic pansinusitis 04/28/2019  . Plantar fasciitis of left foot 02/28/2019  . 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 S/P ablation of atrial fibrillation   . Chronic a-fib ([place]) 06/06/2018  . Essential hypertension 06/06/2018  . Controlled type 2 diabetes mellitus, with long-term current use of insulin ([place]) 06/06/2018  . Sleep apnea 06/06/2018  . Overweight 06/06/2018  . Bipolar disorder, manic ([place]) 05/29/2016  . 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 Ventral incisional hernia 01/29/2015  . Perirectal abscess 02/18/2012  . SPONDYLOSIS 09/19/2008  . Pain in thoracic spine 09/19/2008  . Lumbar pain 09/19/2008  . Levator syndrome 2001    Past Surgical History:  Procedure Laterality Date  . ANAL FISSURE REPAIR  08/05/2000   proctoscopy  . APPENDECTOMY  1984  . ATRIAL FIBRILLATION ABLATION  10/28/2018  . ATRIAL FIBRILLATION ABLATION N/A 10/28/2018   Procedure: ATRIAL FIBRILLATION ABLATION;  Surgeon: Constance Haw, MD;  Location: [facility];  Service: Cardiovascular;  Laterality: N/A;  . BIOPSY  05/24/2019   Procedure: BIOPSY;  Surgeon: Ronald Lobo, MD;  Location: [facility];  Service: Endoscopy;;  . BIOPSY  08/10/2019   Procedure: BIOPSY;  Surgeon: Ronald Lobo, MD;  [facility]: [facility];  Service: Endoscopy;;  . COLONOSCOPY  2011  . COLONOSCOPY WITH PROPOFOL N/A 08/10/2019   Procedure: COLONOSCOPY WITH PROPOFOL;  Surgeon: Ronald Lobo, MD;  [facility];  Service: Endoscopy;  Laterality: N/A;  . ESOPHAGOGASTRODUODENOSCOPY (EGD) WITH PROPOFOL N/A 05/24/2019   Procedure:  ESOPHAGOGASTRODUODENOSCOPY (EGD) WITH PROPOFOL;  Surgeon: Ronald Lobo, MD;  [facility];  Service: Endoscopy;  Laterality: N/A;  . GASTROCNEMIUS RECESSION Left 11/03/2019   Procedure: LEFT GASTROCNEMIUS RECESSION;  Surgeon: Newt Minion, MD;  Location: [facility];  Service: Orthopedics;  Laterality: Left;  . HERNIA REPAIR    . INSERTION OF MESH N/A 01/29/2015   Procedure: INSERTION OF MESH;  Surgeon: Excell Seltzer, MD;  Location: [facility];  Service: General;  Laterality: N/A;  . IRRIGATION AND DEBRIDEMENT ABSCESS  02/18/2012   peri-rectal  . LEFT HEART CATH AND CORONARY ANGIOGRAPHY N/A 06/08/2018   Procedure: LEFT HEART CATH AND CORONARY ANGIOGRAPHY;  Surgeon: Leonie Man, MD;  Location: [facility];  Service: Cardiovascular;  Laterality: N/A;  . LEFT HEART CATH AND CORONARY ANGIOGRAPHY N/A 10/18/2020   Procedure: LEFT HEART CATH AND CORONARY ANGIOGRAPHY;  Surgeon: Martinique, Peter M, MD;  Location: [facility];  Service: Cardiovascular;  Laterality: N/A;  . NASAL SEPTOPLASTY W/ TURBINOPLASTY  05/31/2019  . NASAL SEPTOPLASTY W/ TURBINOPLASTY Bilateral 05/31/2019   Procedure: NASAL SEPTOPLASTY WITH BILATERAL TURBINATE REDUCTION;  Surgeon: Leta Baptist, MD;  Location: [facility];  Service: ENT;  Laterality: Bilateral;  . PLANTAR FASCIA RELEASE Left 11/03/2019   Procedure: PLANTAR FASCIA RELEASE LEFT FOOT;  Surgeon: Newt Minion, MD;  Location: [facility];  Service: Orthopedics;  Laterality: Left;  . POLYPECTOMY  08/10/2019   Procedure: POLYPECTOMY;  Surgeon: Ronald Lobo, MD;  [facility];  Service: Endoscopy;;  . SHOULDER ARTHROSCOPY Left ?2009   "repaired  AC joint; reattached bicept tendon"  . SHOULDER ARTHROSCOPY W/ LABRAL REPAIR Left 08/08/2007  . UMBILICAL HERNIA REPAIR  10/27/2010  . VENTRAL HERNIA REPAIR N/A 01/29/2015   Procedure: LAPAROSCOPIC VENTRAL HERNIA;  Surgeon: Excell Seltzer, MD;  [facility];  Service: General;  Laterality: N/A;       Family  History  Problem Relation Age of Onset  . Breast cancer Mother   . Ovarian cancer Mother   . Diabetes Mother   . Hypertension Mother   . Hyperlipidemia Mother   . Heart disease Mother   . Sleep apnea Mother   . Obesity Mother   . Diabetes Father   . Hypertension Father   . Hyperlipidemia Father   . Heart disease Father   . Depression Father   . Anxiety disorder Father   . Bipolar disorder Father   . Sleep apnea Father   . Obesity Father     Social History   Tobacco Use  . Smoking status: Former Smoker    Packs/day: 0.50    Years: 1.00    Pack years: 0.50    Types: Cigarettes  . Smokeless tobacco: Never Used  . Tobacco comment: quit 1983  Vaping Use  . Vaping Use: Never used  Substance Use Topics  . Alcohol use: Not Currently    Comment: rare wine  . Drug use: Not Currently    Comment: not since 70'S    Home Medications Prior to Admission medications   Medication Sig Start Date End Date Taking? Authorizing Provider  aspirin EC 81 MG tablet Take 1 tablet (81 mg total) by mouth daily. Swallow whole. 10/02/20   Revankar, Reita Cliche, MD  atorvastatin (LIPITOR) 10 MG tablet Take 10 mg by mouth daily.    [provider]  BELSOMRA 20 MG TABS TAKE 20 MG BY MOUTH DAILY. 01/28/21   Laurin Coder, MD  cariprazine (VRAYLAR) capsule Take by mouth daily.     [provider]  carvedilol (COREG) 3.125 MG tablet Take 3.125 mg by mouth as needed.     [provider]  hydrOXYzine (ATARAX/VISTARIL) 25 MG tablet Take 25 mg by mouth at bedtime.    [provider]  ibuprofen (ADVIL) 800 MG tablet  10/24/20   [provider]  JARDIANCE 10 MG TABS tablet Take 10 mg by mouth daily. 09/20/20   [provider]  meloxicam (MOBIC) 7.5 MG tablet Take 1 tablet (7.5 mg total) by mouth daily. 06/28/20   Carole Civil, MD  methocarbamol (ROBAXIN) 750 MG tablet Take 750 mg by mouth daily.    [provider]  metoprolol tartrate  (LOPRESSOR) 100 MG tablet  10/24/20   [provider]  nitroGLYCERIN (NITROSTAT) 0.4 MG SL tablet Place 0.4 mg under the tongue daily. Every 5 minutes for chest pain    [provider]  NOVOLIN 70/30 (70-30) 100 UNIT/ML injection Inject 36 Units into the skin 2 (two) times daily with a meal. Per sliding scale 10/02/19   [provider]  olmesartan (BENICAR) 40 MG tablet Take 40 mg by mouth daily.    [provider]  omeprazole (PRILOSEC) 40 MG capsule Take 40 mg by mouth daily. 01/14/21   [provider]  pioglitazone (ACTOS) 30 MG tablet  10/24/20   [provider]  pregabalin (LYRICA) 150 MG capsule Take 1 capsule (150 mg total) by mouth 2 (two) times daily. 04/11/20   Trula Slade, DPM  pregabalin (LYRICA) 200 MG capsule Take 200 mg by mouth 3 (three) times daily. 01/23/21   [provider]  ranolazine (RANEXA) 1000 MG SR tablet TAKE 1 TABLET BY MOUTH TWICE A DAY 12/30/20   Revankar, Reita Cliche, MD  Semaglutide, 1 MG/DOSE, (OZEMPIC, 1 MG/DOSE,) 2 MG/1.5ML SOPN Inject 1 mg into the skin once a week.    [provider]  traZODone (DESYREL) 100 MG tablet Take 200 mg by mouth at bedtime as needed for sleep.    [provider]  zolpidem (AMBIEN CR) 12.5 MG CR tablet  10/24/20   [provider]    Allergies    Morphine  Review of Systems   Review of Systems  Constitutional: Positive for fatigue. Negative for appetite change, chills and fever.  HENT: Negative for ear pain, rhinorrhea, sneezing and sore throat.   Eyes: Negative for photophobia and visual disturbance.  Respiratory: Negative for cough, chest tightness, shortness of breath and wheezing.   Cardiovascular: Negative for chest pain and palpitations.  Gastrointestinal: Positive for abdominal pain, diarrhea and nausea. Negative for blood in stool, constipation and vomiting.  Genitourinary: Negative for dysuria, hematuria and urgency.  Musculoskeletal:  Negative for myalgias.  Skin: Negative for rash.  Neurological: Negative for dizziness, weakness and light-headedness.    Physical Exam Updated Vital Signs BP 116/72   Pulse (!) 55   Temp 98.2 F (36.8 C) (Oral)   Resp 20   SpO2 99%   Physical Exam Vitals and nursing note reviewed.  Constitutional:      General: He is not in acute distress.    Appearance: He is well-developed and well-nourished.  HENT:     Head: Normocephalic and atraumatic.     Nose: Nose normal.  Eyes:     General: No scleral icterus.       Left eye: No discharge.     Extraocular Movements: EOM normal.     Conjunctiva/sclera: Conjunctivae normal.  Cardiovascular:     Rate and Rhythm: Normal rate and regular rhythm.     Pulses: Intact distal pulses.     Heart sounds: Normal heart sounds. No murmur heard. No friction rub. No gallop.   Pulmonary:     Effort: Pulmonary effort is normal. No respiratory distress.     Breath sounds: Normal breath sounds.  Abdominal:     General: Bowel sounds are normal. There is no distension.     Palpations: Abdomen is soft.     Tenderness: There is abdominal tenderness (Right upper quadrant). There is no guarding.  Musculoskeletal:        General: No edema. Normal range of motion.     Cervical back: Normal range of motion and neck supple.  Skin:    General: Skin is warm and dry.     Findings: No rash.  Neurological:     Mental Status: He is alert.  Motor: No abnormal muscle tone.     Coordination: Coordination normal.  Psychiatric:        Mood and Affect: Mood and affect normal.     ED Results / Procedures / Treatments   Labs (all labs ordered are listed, but only abnormal results are displayed) Labs Reviewed  COMPREHENSIVE METABOLIC PANEL - Abnormal; Notable for the following components:      Result Value   Sodium 134 (*)    CO2 21 (*)    Glucose, Bld 135 (*)    BUN 26 (*)    Total Bilirubin 1.3 (*)    All other components within normal limits   LIPASE, BLOOD - Abnormal; Notable for the following components:   Lipase 91 (*)    All other components within normal limits  CBC WITH DIFFERENTIAL/PLATELET    EKG None  Radiology CT ABDOMEN PELVIS W CONTRAST  Result Date: 02/28/2021 CLINICAL DATA:  Abdominal pain.  Nausea. EXAM: CT ABDOMEN AND PELVIS WITH CONTRAST TECHNIQUE: Multidetector CT imaging of the abdomen and pelvis was performed using the standard protocol following bolus administration of intravenous contrast. CONTRAST:  149mL OMNIPAQUE IOHEXOL 300 MG/ML  SOLN COMPARISON:  05/10/2019. FINDINGS: Lower chest: Lung bases are clear. Coronary artery calcification. Heart size normal. No pericardial or pleural effusion. Hepatobiliary: Liver and gallbladder are unremarkable. No biliary ductal dilatation. Pancreas: Negative. Spleen: Negative. Adrenals/Urinary Tract: Right adrenal gland is unremarkable. 1.8 cm hyperdense left adrenal nodule is unchanged in size from 05/10/2019. Low-attenuation lesions in the kidneys measure up to 3.2 cm on the left and are indicative of cysts. Kidneys are otherwise unremarkable. Ureters are decompressed. Bladder is grossly unremarkable. Stomach/Bowel: Stomach, small bowel and colon are unremarkable. Appendix is not readily visualized. Vascular/Lymphatic: Atherosclerotic calcification of the aorta. No pathologically enlarged lymph nodes. Reproductive: Prostate is visualized. Other: Ventral hernia repair. Small supraumbilical midline ventral hernia contains fat. No free fluid. Mesenteries and peritoneum are unremarkable. Musculoskeletal: Mild degenerative changes in the spine. No worrisome lytic or sclerotic lesions. IMPRESSION: 1. No acute findings to explain the patient's symptoms. 2. Left adrenal nodule is stable from 05/10/2019, indicative of an adenoma. 3. Aortic atherosclerosis (ICD10-I70.0). Coronary artery calcification. Electronically Signed   By: Lorin Picket M.D.   On: 02/28/2021 14:58   US Abdomen  Limited RUQ (LIVER/GB)  Result Date: 02/28/2021 CLINICAL DATA:  Right upper quadrant abdominal pain EXAM: ULTRASOUND ABDOMEN LIMITED RIGHT UPPER QUADRANT COMPARISON:  None. FINDINGS: Gallbladder: No gallstones or wall thickening visualized. No sonographic Murphy sign noted by sonographer. Common bile duct: Diameter: 3 mm, normal Liver: No focal lesion identified. Increased parenchymal echogenicity. Portal vein is patent on color Doppler imaging with normal direction of blood flow towards the liver. Other: None. IMPRESSION: Increased liver echogenicity likely reflecting steatosis. Gallbladder is unremarkable. Electronically Signed   By: Macy Mis M.D.   On: 02/28/2021 12:55    Procedures Procedures   Medications Ordered in ED Medications  fentaNYL (SUBLIMAZE) injection 25 mcg (25 mcg Intravenous Given 02/28/21 1152)  ondansetron (ZOFRAN) injection 4 mg (4 mg Intravenous Given 02/28/21 1153)  sodium chloride 0.9 % bolus 1,000 mL (1,000 mLs Intravenous New Bag/Given 02/28/21 1308)  iohexol (OMNIPAQUE) 300 MG/ML solution 100 mL (100 mLs Intravenous Contrast Given 02/28/21 1402)    ED Course  I have reviewed the triage vital signs and the nursing notes.  Pertinent labs & imaging results that were available during my care of the patient were reviewed by me and considered in my medical decision making (see  chart for details).  Clinical Course as of 02/28/21 1506  Fri Feb 28, 2021  1202 WBC: 8.2 [HK]  1224 I was directly involved in this patients medical care.  [JH]  1236 Lipase(!): 91 [HK]  1236 Total Bilirubin(!): 1.3 [HK]  1236 ALT: 30 [HK]  1236 AST: 30 [HK]    Clinical Course User Index [HK] Delia Heady, PA-C [JH] [place], Greggory Brandy, MD   MDM Rules/Calculators/A&P                          60 year old male with past medical history of bipolar disorder, chronic lower back pain, GERD, diabetes presenting to the ED with a chief complaint of right upper quadrant abdominal pain.  Symptoms been  going on for 3 to 4 days with associated nausea and loose stools.  No vomiting.  Denies chest pain, shortness of breath or urinary symptoms.  On exam there is tenderness of the right upper quadrant without rebound or guarding.  No tenderness elsewhere in the abdomen.  CMP with normal LFTs.  CBC is unremarkable.  Lipase slightly elevated at 91.  Right upper quadrant ultrasound without evidence of gallstones.  Patient improved with antiemetics and pain medication given here.  Will give IV fluids and obtain CT abdominal pelvis for further visualize abdomen for cause of pain.  CT of the abdomen pelvis without any acute findings. Suspect t viral cause versus musculoskeletal pain. Patient made hemodynamically stable here. Improved with pain medication and fluids. We'll have him continue over-the-counter pain medications and follow-up with his primary care provider. No evidence of pancreatitis on exam but will have him stay hydrated. Patient agreeable to the plan. Return precautions given.   Patient is hemodynamically stable, in NAD, and able to ambulate in the ED. Evaluation does not show pathology that would require ongoing emergent intervention or inpatient treatment. I explained the diagnosis to the patient. Pain has been managed and has no complaints prior to discharge. Patient is comfortable with above plan and is stable for discharge at this time. All questions were answered prior to disposition. Strict return precautions for returning to the ED were discussed. Encouraged follow up with PCP.   An After Visit Summary was printed and given to the patient.   Portions of this note were generated with [employer]. Dictation errors may occur despite best attempts at proofreading.  Final Clinical Impression(s) / ED Diagnoses Final diagnoses:  RUQ abdominal pain  Elevated lipase    Rx / DC Orders ED Discharge Orders    None       Delia Heady, PA-C 02/28/21 1506    Luna Fuse,  MD 02/28/21 [phone]

## 2021-02-28 NOTE — ED Notes (Signed)
U/S in room at this time. 

## 2021-02-28 NOTE — Discharge Instructions (Addendum)
Take over-the-counter medications as needed to help with symptoms. Make sure you're drinking plenty of fluids and follow-up with your primary care provider. Return to the ER if you start to experience worsening abdominal pain, if you develop fever, chest pain or bloody stools.

## 2021-02-28 NOTE — ED Triage Notes (Signed)
Pt presents with c/o right side pain under his right rib cage for several days. Pt also reports some nausea with the pain and reports he just feels fatigued.

## 2021-03-07 ENCOUNTER — Other Ambulatory Visit: Payer: Self-pay | Admitting: Family Medicine

## 2021-03-07 DIAGNOSIS — K859 Acute pancreatitis without necrosis or infection, unspecified: Secondary | ICD-10-CM

## 2021-03-10 ENCOUNTER — Encounter (HOSPITAL_COMMUNITY): Payer: Self-pay

## 2021-03-10 ENCOUNTER — Inpatient Hospital Stay (HOSPITAL_COMMUNITY)
Admission: EM | Admit: 2021-03-10 | Discharge: 2021-03-15 | DRG: 074 | Disposition: A | Discharge status: Home / Self Care | Payer: No Typology Code available for payment source | Attending: Family Medicine | Admitting: Family Medicine

## 2021-03-10 ENCOUNTER — Emergency Department (HOSPITAL_COMMUNITY): Payer: No Typology Code available for payment source

## 2021-03-10 ENCOUNTER — Other Ambulatory Visit: Payer: Self-pay

## 2021-03-10 DIAGNOSIS — G4700 Insomnia, unspecified: Secondary | ICD-10-CM | POA: Diagnosis present

## 2021-03-10 DIAGNOSIS — R109 Unspecified abdominal pain: Secondary | ICD-10-CM | POA: Diagnosis present

## 2021-03-10 DIAGNOSIS — G4733 Obstructive sleep apnea (adult) (pediatric): Secondary | ICD-10-CM

## 2021-03-10 DIAGNOSIS — Z8041 Family history of malignant neoplasm of ovary: Secondary | ICD-10-CM

## 2021-03-10 DIAGNOSIS — I10 Essential (primary) hypertension: Secondary | ICD-10-CM | POA: Diagnosis present

## 2021-03-10 DIAGNOSIS — Z833 Family history of diabetes mellitus: Secondary | ICD-10-CM

## 2021-03-10 DIAGNOSIS — I2510 Atherosclerotic heart disease of native coronary artery without angina pectoris: Secondary | ICD-10-CM | POA: Diagnosis not present

## 2021-03-10 DIAGNOSIS — R1011 Right upper quadrant pain: Secondary | ICD-10-CM

## 2021-03-10 DIAGNOSIS — Z8249 Family history of ischemic heart disease and other diseases of the circulatory system: Secondary | ICD-10-CM

## 2021-03-10 DIAGNOSIS — Z7984 Long term (current) use of oral hypoglycemic drugs: Secondary | ICD-10-CM

## 2021-03-10 DIAGNOSIS — Z83438 Family history of other disorder of lipoprotein metabolism and other lipidemia: Secondary | ICD-10-CM

## 2021-03-10 DIAGNOSIS — Z7982 Long term (current) use of aspirin: Secondary | ICD-10-CM

## 2021-03-10 DIAGNOSIS — Z9989 Dependence on other enabling machines and devices: Secondary | ICD-10-CM

## 2021-03-10 DIAGNOSIS — R1013 Epigastric pain: Secondary | ICD-10-CM

## 2021-03-10 DIAGNOSIS — Z803 Family history of malignant neoplasm of breast: Secondary | ICD-10-CM

## 2021-03-10 DIAGNOSIS — K760 Fatty (change of) liver, not elsewhere classified: Secondary | ICD-10-CM | POA: Diagnosis present

## 2021-03-10 DIAGNOSIS — Z20822 Contact with and (suspected) exposure to covid-19: Secondary | ICD-10-CM | POA: Diagnosis present

## 2021-03-10 DIAGNOSIS — K3184 Gastroparesis: Secondary | ICD-10-CM | POA: Diagnosis present

## 2021-03-10 DIAGNOSIS — Z87891 Personal history of nicotine dependence: Secondary | ICD-10-CM

## 2021-03-10 DIAGNOSIS — E1143 Type 2 diabetes mellitus with diabetic autonomic (poly)neuropathy: Secondary | ICD-10-CM | POA: Diagnosis not present

## 2021-03-10 DIAGNOSIS — E1149 Type 2 diabetes mellitus with other diabetic neurological complication: Secondary | ICD-10-CM | POA: Diagnosis present

## 2021-03-10 DIAGNOSIS — M5416 Radiculopathy, lumbar region: Secondary | ICD-10-CM | POA: Diagnosis present

## 2021-03-10 DIAGNOSIS — Z794 Long term (current) use of insulin: Secondary | ICD-10-CM

## 2021-03-10 DIAGNOSIS — K5900 Constipation, unspecified: Secondary | ICD-10-CM | POA: Diagnosis present

## 2021-03-10 HISTORY — DX: Unspecified abdominal pain: R10.9

## 2021-03-10 LAB — URINALYSIS, ROUTINE W REFLEX MICROSCOPIC
Bilirubin Urine: NEGATIVE
Glucose, UA: NEGATIVE mg/dL
Hgb urine dipstick: NEGATIVE
Ketones, ur: NEGATIVE mg/dL
Leukocytes,Ua: NEGATIVE
Nitrite: NEGATIVE
Protein, ur: NEGATIVE mg/dL
Specific Gravity, Urine: 1.014 (ref 1.005–1.030)
pH: 5 (ref 5.0–8.0)

## 2021-03-10 LAB — COMPREHENSIVE METABOLIC PANEL
ALT: 29 U/L (ref 0–44)
AST: 24 U/L (ref 15–41)
Albumin: 4.4 g/dL (ref 3.5–5.0)
Alkaline Phosphatase: 70 U/L (ref 38–126)
Anion gap: 10 (ref 5–15)
BUN: 23 mg/dL (ref 8–23)
CO2: 22 mmol/L (ref 22–32)
Calcium: 9.7 mg/dL (ref 8.9–10.3)
Chloride: 107 mmol/L (ref 98–111)
Creatinine, Ser: 1.14 mg/dL (ref 0.61–1.24)
GFR, Estimated: >60 mL/min (ref >60)
Glucose, Bld: 159 mg/dL — ABNORMAL HIGH (ref 70–99)
Potassium: 4.4 mmol/L (ref 3.5–5.1)
Sodium: 139 mmol/L (ref 135–145)
Total Bilirubin: 1 mg/dL (ref 0.3–1.2)
Total Protein: 7.2 g/dL (ref 6.5–8.1)

## 2021-03-10 LAB — CBC
HCT: 40.6 % (ref 39.0–52.0)
Hemoglobin: 13.8 g/dL (ref 13.0–17.0)
MCH: 31.4 pg (ref 26.0–34.0)
MCHC: 34 g/dL (ref 30.0–36.0)
MCV: 92.5 fL (ref 80.0–100.0)
Platelets: 226 10*3/uL (ref 150–400)
RBC: 4.39 MIL/uL (ref 4.22–5.81)
RDW: 15.4 % (ref 11.5–15.5)
WBC: 9.7 10*3/uL (ref 4.0–10.5)
nRBC: 0 % (ref 0.0–0.2)

## 2021-03-10 LAB — LIPASE, BLOOD: Lipase: 47 U/L (ref 11–51)

## 2021-03-10 MED ORDER — ONDANSETRON HCL 4 MG/2ML IJ SOLN: 4.0000 mg | Freq: Four times a day (QID) | INTRAMUSCULAR | Status: DC | PRN

## 2021-03-10 MED ORDER — HYDROMORPHONE HCL 1 MG/ML IJ SOLN
0.5000 mg | Freq: Once | INTRAMUSCULAR | Status: AC
  Administered 2021-03-10: 0.5 mg via INTRAVENOUS
  Filled 2021-03-10: qty 1

## 2021-03-10 MED ORDER — FENTANYL CITRATE (PF) 100 MCG/2ML IJ SOLN
50.0000 ug | Freq: Once | INTRAMUSCULAR | Status: AC
  Administered 2021-03-10: 50 ug via INTRAVENOUS
  Filled 2021-03-10: qty 2

## 2021-03-10 MED ORDER — ONDANSETRON HCL 4 MG/2ML IJ SOLN
4.0000 mg | Freq: Once | INTRAMUSCULAR | Status: AC
  Administered 2021-03-10: 4 mg via INTRAVENOUS
  Filled 2021-03-10: qty 2

## 2021-03-10 MED ORDER — IOHEXOL 300 MG/ML  SOLN
100.0000 mL | Freq: Once | INTRAMUSCULAR | Status: AC | PRN
  Administered 2021-03-10: 100 mL via INTRAVENOUS

## 2021-03-10 MED ORDER — DROPERIDOL 2.5 MG/ML IJ SOLN
1.2500 mg | Freq: Once | INTRAMUSCULAR | Status: AC
  Administered 2021-03-10: 1.25 mg via INTRAVENOUS
  Filled 2021-03-10: qty 2

## 2021-03-10 MED ORDER — HYDROMORPHONE HCL 1 MG/ML IJ SOLN: 0.5000 mg | INTRAMUSCULAR | Status: DC | PRN

## 2021-03-10 MED ORDER — SODIUM CHLORIDE 0.9 % IV BOLUS
1000.0000 mL | Freq: Once | INTRAVENOUS | Status: AC
  Administered 2021-03-10: 1000 mL via INTRAVENOUS

## 2021-03-10 NOTE — ED Triage Notes (Addendum)
Patient c/o right side pain under his right rib cage x 1 1/2 weeks. Patient reports that the pain radiates into the right lower back and right lower abdomen. patient also c/o N/V/D. Patient was seen on 02/28/21 for the same.  Patient added that he has extreme fatigue and some dizziness.

## 2021-03-10 NOTE — ED Provider Notes (Signed)
[facility] DEPT Provider Note   CSN: [id number] Arrival date & time: 03/10/21  1244     History Chief Complaint  Patient presents with  . Abdominal Pain  . Emesis  . Diarrhea    Gary Grimes is a 60 y.o. male with PMH/o past 1 history of ADHD, bipolar, chronic A. fib (corrected with ablation), hyperlipidemia who presents for evaluation of right-sided abdominal pain.  He reports that this is been ongoing for several weeks.  He was seen here in the ED on 02/28/2021 for evaluation of similar symptoms.  His work-up at that time showed a slightly elevated lipase.  He was discharged home with instructions for follow-up.  He followed up with his primary care doctor.  At that time, his work-up was unremarkable.  Received some more fluids.  He states that he is continued have pain in the right upper quadrant.  He states that it radiates down his right lower quadrant as well.  He states that is been constant.  He has had multiple episodes of nausea/vomiting states he has not been able to tolerate much p.o.  He states no blood in the emesis but does feel like he has had some green bile.  He has also had some loose stools.  No blood noted in the stools.  He states the pain is worse with movement.  He states sometimes the pain will get so intense that it will take his breath away.  He denies any shortness of breath, chest pain.  He is not any fevers.  Denies any alcohol use.  He denies any chest pain, difficulty breathing, dysuria, hematuria.  The history is provided by the patient.       Past Medical History:  Diagnosis Date  . Achilles tendon contracture, left   . Acquired equinus deformity of both feet 10/22/2019  . ADHD   . Anginal pain ([place])    ER visit 01/14/2014 visit on chart   . Anxiety    pt denies  . Arthritis   . Bipolar 1 disorder ([place]) 01/15/2020  . Bipolar disorder ([place])   . Bipolar disorder, manic ([place]) 05/29/2016  . Chronic a-fib ([place]) 06/06/2018  .  Chronic insomnia 08/01/2020  . Chronic lower back pain   . Chronic pansinusitis 04/28/2019  . Constipation   . Controlled type 2 diabetes mellitus, with long-term current use of insulin ([place]) 06/06/2018  . 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/19/2020  . Depression    did get seen in er 4/13 for evaluation-psyc situational none recent  . DOE (dyspnea on exertion) 11/29/2019  . Essential hypertension 06/06/2018  . GERD (gastroesophageal reflux disease)   . Headache    none recent  . High cholesterol   . High triglycerides   . HLD (hyperlipidemia) 01/15/2020  . Hypertension   . Increased body mass index 09/19/2020  . Joint pain   . Levator syndrome 2001   history   . Lumbar pain 09/19/2008   Qualifier: Diagnosis of  By: Aline Brochure MD, Dorothyann Peng    . Lumbar radiculopathy 09/19/2020  . 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 Mild CAD 05/17/2019  . Neuropathy   . OSA (obstructive sleep apnea) 01/15/2020  . OSA on CPAP   . Overweight 06/06/2018  . Pain in thoracic spine 09/19/2008   Qualifier: Diagnosis of  By: Aline Brochure MD, Dorothyann Peng    . Panic attacks   . Perirectal abscess   . Plantar fasciitis of left foot  02/28/2019  . Prediabetes   . Preoperative cardiovascular examination 05/17/2019  . S/P ablation of atrial fibrillation   . S/P nasal septoplasty 05/31/2019  . Sleep apnea 06/06/2018  . SPONDYLOSIS 09/19/2008   Qualifier: Diagnosis of  By: Aline Brochure MD, Dorothyann Peng    . Type 1 diabetes mellitus ([place]) 09/19/2020  . Type II diabetes mellitus ([place])   . 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 Ventral incisional hernia 01/29/2015  . Vitamin D deficiency   . Work related injury 09/19/2020    Patient Active Problem List   Diagnosis Date Noted  . Intractable abdominal pain 03/10/2021  . Angina pectoris ([place]) 10/14/2020  . Bipolar disorder ([place])   . OSA on CPAP   . ADHD   . Anginal pain ([place])   . Anxiety   .  Arthritis   . Chronic lower back pain   . Constipation   . Depression   . Headache   . High cholesterol   . High triglycerides   . Joint pain   . Neuropathy   . Panic attacks   . Prediabetes   . Type II diabetes mellitus ([place])   . Vitamin D deficiency   . Work related injury 09/19/2020  . 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/19/2020  . Increased body mass index 09/19/2020  . Type 1 diabetes mellitus ([place]) 09/19/2020  . Lumbar radiculopathy 09/19/2020  . Chronic insomnia 08/01/2020  . Bipolar 1 disorder ([place]) 01/15/2020  . GERD (gastroesophageal reflux disease) 01/15/2020  . Hypertension 01/15/2020  . OSA (obstructive sleep apnea) 01/15/2020  . HLD (hyperlipidemia) 01/15/2020  . DOE (dyspnea on exertion) 11/29/2019  . Achilles tendon contracture, left   . Acquired equinus deformity of both feet 10/22/2019  . S/P nasal septoplasty 05/31/2019  . Preoperative cardiovascular examination 05/17/2019  . Mild CAD 05/17/2019  . Chronic pansinusitis 04/28/2019  . Plantar fasciitis of left foot 02/28/2019  . 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 S/P ablation of atrial fibrillation   . Chronic a-fib ([place]) 06/06/2018  . Essential hypertension 06/06/2018  . Controlled type 2 diabetes mellitus, with long-term current use of insulin ([place]) 06/06/2018  . Sleep apnea 06/06/2018  . Overweight 06/06/2018  . Bipolar disorder, manic ([place]) 05/29/2016  . 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 Ventral incisional hernia 01/29/2015  . Perirectal abscess 02/18/2012  . SPONDYLOSIS 09/19/2008  . Pain in thoracic spine 09/19/2008  . Lumbar pain 09/19/2008  . Levator syndrome 2001    Past Surgical History:  Procedure Laterality Date  . ANAL FISSURE REPAIR  08/05/2000   proctoscopy  . APPENDECTOMY  1984  . ATRIAL FIBRILLATION ABLATION  10/28/2018  . ATRIAL FIBRILLATION ABLATION N/A 10/28/2018    Procedure: ATRIAL FIBRILLATION ABLATION;  Surgeon: Constance Haw, MD;  Location: [facility];  Service: Cardiovascular;  Laterality: N/A;  . BIOPSY  05/24/2019   Procedure: BIOPSY;  Surgeon: Ronald Lobo, MD;  Location: [facility];  Service: Endoscopy;;  . BIOPSY  08/10/2019   Procedure: BIOPSY;  Surgeon: Ronald Lobo, MD;  Location: [facility];  Service: Endoscopy;;  . COLONOSCOPY  2011  . COLONOSCOPY WITH PROPOFOL N/A 08/10/2019   Procedure: COLONOSCOPY WITH PROPOFOL;  Surgeon: Ronald Lobo, MD;  [facility]: [facility];  Service: Endoscopy;  Laterality: N/A;  . ESOPHAGOGASTRODUODENOSCOPY (EGD) WITH PROPOFOL N/A 05/24/2019   Procedure: ESOPHAGOGASTRODUODENOSCOPY (EGD) WITH PROPOFOL;  Surgeon: Ronald Lobo, MD;  [facility];  Service: Endoscopy;  Laterality: N/A;  . GASTROCNEMIUS RECESSION Left 11/03/2019   Procedure: LEFT GASTROCNEMIUS RECESSION;  Surgeon: Newt Minion, MD;  Location: [facility];  Service: Orthopedics;  Laterality: Left;  . HERNIA REPAIR    . INSERTION OF MESH N/A 01/29/2015   Procedure: INSERTION OF MESH;  Surgeon: Excell Seltzer, MD;  Location: [facility];  Service: General;  Laterality: N/A;  . IRRIGATION AND DEBRIDEMENT ABSCESS  02/18/2012   peri-rectal  . LEFT HEART CATH AND CORONARY ANGIOGRAPHY N/A 06/08/2018   Procedure: LEFT HEART CATH AND CORONARY ANGIOGRAPHY;  Surgeon: Leonie Man, MD;  Location: [facility];  Service: Cardiovascular;  Laterality: N/A;  . LEFT HEART CATH AND CORONARY ANGIOGRAPHY N/A 10/18/2020   Procedure: LEFT HEART CATH AND CORONARY ANGIOGRAPHY;  Surgeon: Martinique, Peter M, MD;  Location: [facility];  Service: Cardiovascular;  Laterality: N/A;  . NASAL SEPTOPLASTY W/ TURBINOPLASTY  05/31/2019  . NASAL SEPTOPLASTY W/ TURBINOPLASTY Bilateral 05/31/2019   Procedure: NASAL SEPTOPLASTY WITH BILATERAL TURBINATE REDUCTION;  Surgeon: Leta Baptist, MD;  Location: [facility];  Service: ENT;  Laterality: Bilateral;  .  PLANTAR FASCIA RELEASE Left 11/03/2019   Procedure: PLANTAR FASCIA RELEASE LEFT FOOT;  Surgeon: Newt Minion, MD;  Location: [facility];  Service: Orthopedics;  Laterality: Left;  . POLYPECTOMY  08/10/2019   Procedure: POLYPECTOMY;  Surgeon: Ronald Lobo, MD;  [facility];  Service: Endoscopy;;  . SHOULDER ARTHROSCOPY Left ?2009   "repaired  AC joint; reattached bicept tendon"  . SHOULDER ARTHROSCOPY W/ LABRAL REPAIR Left 08/08/2007  . UMBILICAL HERNIA REPAIR  10/27/2010  . VENTRAL HERNIA REPAIR N/A 01/29/2015   Procedure: LAPAROSCOPIC VENTRAL HERNIA;  Surgeon: Excell Seltzer, MD;  [facility];  Service: General;  Laterality: N/A;       Family History  Problem Relation Age of Onset  . Breast cancer Mother   . Ovarian cancer Mother   . Diabetes Mother   . Hypertension Mother   . Hyperlipidemia Mother   . Heart disease Mother   . Sleep apnea Mother   . Obesity Mother   . Diabetes Father   . Hypertension Father   . Hyperlipidemia Father   . Heart disease Father   . Depression Father   . Anxiety disorder Father   . Bipolar disorder Father   . Sleep apnea Father   . Obesity Father     Social History   Tobacco Use  . Smoking status: Former Smoker    Packs/day: 0.50    Years: 1.00    Pack years: 0.50    Types: Cigarettes  . Smokeless tobacco: Never Used  . Tobacco comment: quit 1983  Vaping Use  . Vaping Use: Never used  Substance Use Topics  . Alcohol use: Not Currently    Comment: rare wine  . Drug use: Not Currently    Comment: not since 70'S    Home Medications Prior to Admission medications   Medication Sig Start Date End Date Taking? Authorizing Provider  aspirin EC 81 MG tablet Take 1 tablet (81 mg total) by mouth daily. Swallow whole. 10/02/20   Revankar, Reita Cliche, MD  atorvastatin (LIPITOR) 10 MG tablet Take 10 mg by mouth daily.    [provider]  BELSOMRA 20 MG TABS TAKE 20 MG BY MOUTH DAILY. 01/28/21   Laurin Coder, MD   cariprazine (VRAYLAR) capsule Take by mouth daily.     [provider]  carvedilol (COREG) 3.125 MG tablet Take 3.125 mg by  mouth as needed.     [provider]  hydrOXYzine (ATARAX/VISTARIL) 25 MG tablet Take 25 mg by mouth at bedtime.    [provider]  ibuprofen (ADVIL) 800 MG tablet  10/24/20   [provider]  JARDIANCE 10 MG TABS tablet Take 10 mg by mouth daily. 09/20/20   [provider]  meloxicam (MOBIC) 7.5 MG tablet Take 1 tablet (7.5 mg total) by mouth daily. 06/28/20   Carole Civil, MD  methocarbamol (ROBAXIN) 750 MG tablet Take 750 mg by mouth daily.    [provider]  metoprolol tartrate (LOPRESSOR) 100 MG tablet  10/24/20   [provider]  nitroGLYCERIN (NITROSTAT) 0.4 MG SL tablet Place 0.4 mg under the tongue daily. Every 5 minutes for chest pain    [provider]  NOVOLIN 70/30 (70-30) 100 UNIT/ML injection Inject 36 Units into the skin 2 (two) times daily with a meal. Per sliding scale 10/02/19   [provider]  olmesartan (BENICAR) 40 MG tablet Take 40 mg by mouth daily.    [provider]  omeprazole (PRILOSEC) 40 MG capsule Take 40 mg by mouth daily. 01/14/21   [provider]  pioglitazone (ACTOS) 30 MG tablet  10/24/20   [provider]  pregabalin (LYRICA) 150 MG capsule Take 1 capsule (150 mg total) by mouth 2 (two) times daily. 04/11/20   Trula Slade, DPM  pregabalin (LYRICA) 200 MG capsule Take 200 mg by mouth 3 (three) times daily. 01/23/21   [provider]  ranolazine (RANEXA) 1000 MG SR tablet TAKE 1 TABLET BY MOUTH TWICE A DAY 12/30/20   Revankar, Reita Cliche, MD  Semaglutide, 1 MG/DOSE, (OZEMPIC, 1 MG/DOSE,) 2 MG/1.5ML SOPN Inject 1 mg into the skin once a week.    [provider]  traZODone (DESYREL) 100 MG tablet Take 200 mg by mouth at bedtime as needed for sleep.    [provider]  zolpidem (AMBIEN CR) 12.5 MG CR  tablet  10/24/20   [provider]    Allergies    Morphine  Review of Systems   Review of Systems  Constitutional: Negative for fever.  Respiratory: Negative for cough and shortness of breath.   Cardiovascular: Negative for chest pain.  Gastrointestinal: Positive for abdominal pain, diarrhea, nausea and vomiting.  Genitourinary: Negative for dysuria and hematuria.  Neurological: Negative for headaches.  All other systems reviewed and are negative.   Physical Exam Updated Vital Signs BP 126/77 (BP Location: Left Arm)   Pulse (!) 56   Temp 98.4 F (36.9 C) (Oral)   Resp 11   Ht 6' (1.829 m)   Wt 127 kg   SpO2 97%   BMI 37.97 kg/m   Physical Exam Vitals and nursing note reviewed.  Constitutional:      Appearance: Normal appearance. He is well-developed.  HENT:     Head: Normocephalic and atraumatic.  Eyes:     General: Lids are normal.     Conjunctiva/sclera: Conjunctivae normal.     Pupils: Pupils are equal, round, and reactive to light.  Cardiovascular:     Rate and Rhythm: Normal rate and regular rhythm.     Pulses:          Radial pulses are 2+ on the right side and 2+ on the left side.       Dorsalis pedis pulses are 2+ on the right side and 2+ on the left side.     Heart sounds: Normal  heart sounds. No murmur heard. No friction rub. No gallop.   Pulmonary:     Effort: Pulmonary effort is normal.     Breath sounds: Normal breath sounds.     Comments: Lungs clear to auscultation bilaterally.  Symmetric chest rise.  No wheezing, rales, rhonchi. Abdominal:     Palpations: Abdomen is soft. Abdomen is not rigid.     Tenderness: There is abdominal tenderness in the right upper quadrant and right lower quadrant. There is no guarding.     Comments: Abdomen soft, nondistended.  Tenderness palpation right upper quadrant, right mid and lower abdomen.  No rigidity, guarding.  No CVA tenderness noted bilaterally.  Musculoskeletal:        General: Normal range of  motion.     Cervical back: Full passive range of motion without pain.  Skin:    General: Skin is warm and dry.     Capillary Refill: Capillary refill takes less than 2 seconds.  Neurological:     Mental Status: He is alert and oriented to person, place, and time.  Psychiatric:        Speech: Speech normal.     ED Results / Procedures / Treatments   Labs (all labs ordered are listed, but only abnormal results are displayed) Labs Reviewed  COMPREHENSIVE METABOLIC PANEL - Abnormal; Notable for the following components:      Result Value   Glucose, Bld 159 (*)    All other components within normal limits  SARS CORONAVIRUS 2 (TAT 6-24 HRS)  LIPASE, BLOOD  CBC  URINALYSIS, ROUTINE W REFLEX MICROSCOPIC    EKG None  Radiology CT ABDOMEN PELVIS W CONTRAST  Result Date: 03/10/2021 CLINICAL DATA:  Right-sided pain under right ribcage for 1.5 weeks, pain radiates to right lower back and right lower abdomen, nausea/vomiting/diarrhea EXAM: CT ABDOMEN AND PELVIS WITH CONTRAST TECHNIQUE: Multidetector CT imaging of the abdomen and pelvis was performed using the standard protocol following bolus administration of intravenous contrast. CONTRAST:  133mL OMNIPAQUE IOHEXOL 300 MG/ML  SOLN COMPARISON:  02/28/2021, 05/10/2019 FINDINGS: Lower chest: No acute pleural or parenchymal lung disease. Hepatobiliary: No focal liver abnormality is seen. No gallstones, gallbladder wall thickening, or biliary dilatation. Pancreas: Unremarkable. No pancreatic ductal dilatation or surrounding inflammatory changes. Spleen: Normal in size without focal abnormality. Adrenals/Urinary Tract: Stable 18 x 19 mm left adrenal nodule, likely adenoma given long-term stability. Right adrenals unremarkable. Stable bilateral renal cortical cysts. No urinary tract calculi or obstructive uropathy. Bladder is moderately distended with no gross abnormality. Stomach/Bowel: No bowel obstruction or ileus. Diverticulosis of the descending and  sigmoid colon without diverticulitis. The appendix is surgically absent. No bowel wall thickening or inflammatory change. Vascular/Lymphatic: Aortic atherosclerosis. No enlarged abdominal or pelvic lymph nodes. Reproductive: Prostate is unremarkable. Other: No free fluid or free gas. No abdominal wall hernia. Stable postsurgical changes from prior ventral hernia repair. Musculoskeletal: There are no acute displaced fractures. Reconstructed images demonstrate no additional findings. IMPRESSION: 1. No acute intra-abdominal or intrapelvic process. 2. Diverticulosis without diverticulitis. 3. Stable left adrenal nodule, most consistent with adenoma given long-term stability since 2020. 4.  Aortic Atherosclerosis (ICD10-I70.0). Electronically Signed   By: Randa Ngo M.D.   On: 03/10/2021 19:30    Procedures Procedures   Medications Ordered in ED Medications  HYDROmorphone (DILAUDID) injection 0.5-1 mg (has no administration in time range)  ondansetron (ZOFRAN) injection 4 mg (has no administration in time range)  ondansetron (ZOFRAN) injection 4 mg (4 mg Intravenous Given 03/10/21 1832)  fentaNYL (SUBLIMAZE) injection 50 mcg (50 mcg Intravenous Given 03/10/21 1832)  sodium chloride 0.9 % bolus 1,000 mL (1,000 mLs Intravenous Bolus 03/10/21 1833)  iohexol (OMNIPAQUE) 300 MG/ML solution 100 mL (100 mLs Intravenous Contrast Given 03/10/21 1907)  HYDROmorphone (DILAUDID) injection 0.5 mg (0.5 mg Intravenous Given 03/10/21 2038)  HYDROmorphone (DILAUDID) injection 0.5 mg (0.5 mg Intravenous Given 03/10/21 2110)  droperidol (INAPSINE) 2.5 MG/ML injection 1.25 mg (1.25 mg Intravenous Given 03/10/21 2152)  HYDROmorphone (DILAUDID) injection 0.5 mg (0.5 mg Intravenous Given 03/10/21 2151)    ED Course  I have reviewed the triage vital signs and the nursing notes.  Pertinent labs & imaging results that were available during my care of the patient were reviewed by me and considered in my medical decision making  (see chart for details).    MDM Rules/Calculators/A&P                          60 year old male who presents for evaluation of abdominal pain that has been ongoing for several weeks.  Was seen here earlier in March for evaluation.  At that time, his work-up was unremarkable although he did have slightly elevated lipase.  He was able to be discharged home.  He followed up with PCP but states he is continued have symptoms.  He reports no new nausea/vomiting/diarrhea.  He has had some chills but no fevers.  No urinary complaints.  On initial arrival, he is afebrile nontoxic-appearing.  Vital signs are stable.  On exam, his tenderness palpation of the right upper, right mid, right lower abdomen.  No CVA tenderness.  Doubt appendicitis given duration of symptoms but is consideration.  Question ongoing pancreatitis versus hepatobiliary etiology.  Also consider viral GI process.  History/physical exam not concerning for PE, ACS etiology.  We will plan for labs.  CMP shows normal BUN/creatinine.  Glucose slightly elevated at 159 9.  UA is negative for any infectious etiology.  Lipase unremarkable.  CBC shows no leukocytosis or anemia.  Reevaluation.  Patient still having pain.  Will give additional analgesics and check CT abdomen pelvis given his continued pain and questionable bilious emesis.  CT on pelvis shows no acute intra-abdominal or intrapelvic process.  He has diverticulosis without diverticulitis.  Reevaluation.  Patient reports he is still having pain.  He is otherwise hemodynamically stable.  He has not had any vomiting here in the ED.  Will give additional Dilaudid dose and reassess.  Patient still very uncomfortable and states he is having pain.  He has gotten fentanyl, Dilaudid as well as droperidol and still having pain.  He is concerned that he is continue to have this for such a long time.  Given that this is his second visit and continues to have pain despite multiple rounds of pain  medication, will plan for admission for pain control.  Discussed patient with Dr. Myna Hidalgo (hospitalist) who accepts patient for admission.   Portions of this note were generated with [employer]. Dictation errors may occur despite best attempts at proofreading.  Final Clinical Impression(s) / ED Diagnoses Final diagnoses:  Right upper quadrant abdominal pain    Rx / DC Orders ED Discharge Orders    None       Volanda Napoleon, PA-C 03/10/21 2316    Lucrezia Starch, MD 03/14/21 [phone]

## 2021-03-11 ENCOUNTER — Observation Stay (HOSPITAL_COMMUNITY): Payer: No Typology Code available for payment source

## 2021-03-11 DIAGNOSIS — K760 Fatty (change of) liver, not elsewhere classified: Secondary | ICD-10-CM | POA: Diagnosis present

## 2021-03-11 DIAGNOSIS — E1143 Type 2 diabetes mellitus with diabetic autonomic (poly)neuropathy: Secondary | ICD-10-CM | POA: Diagnosis present

## 2021-03-11 DIAGNOSIS — I2510 Atherosclerotic heart disease of native coronary artery without angina pectoris: Secondary | ICD-10-CM | POA: Diagnosis present

## 2021-03-11 DIAGNOSIS — E1149 Type 2 diabetes mellitus with other diabetic neurological complication: Secondary | ICD-10-CM | POA: Diagnosis not present

## 2021-03-11 DIAGNOSIS — K3184 Gastroparesis: Secondary | ICD-10-CM | POA: Diagnosis present

## 2021-03-11 DIAGNOSIS — Z83438 Family history of other disorder of lipoprotein metabolism and other lipidemia: Secondary | ICD-10-CM | POA: Diagnosis not present

## 2021-03-11 DIAGNOSIS — Z833 Family history of diabetes mellitus: Secondary | ICD-10-CM | POA: Diagnosis not present

## 2021-03-11 DIAGNOSIS — G4700 Insomnia, unspecified: Secondary | ICD-10-CM | POA: Diagnosis present

## 2021-03-11 DIAGNOSIS — G4733 Obstructive sleep apnea (adult) (pediatric): Secondary | ICD-10-CM | POA: Diagnosis present

## 2021-03-11 DIAGNOSIS — Z87891 Personal history of nicotine dependence: Secondary | ICD-10-CM | POA: Diagnosis not present

## 2021-03-11 DIAGNOSIS — R1011 Right upper quadrant pain: Secondary | ICD-10-CM | POA: Diagnosis present

## 2021-03-11 DIAGNOSIS — K5900 Constipation, unspecified: Secondary | ICD-10-CM | POA: Diagnosis present

## 2021-03-11 DIAGNOSIS — Z794 Long term (current) use of insulin: Secondary | ICD-10-CM | POA: Diagnosis not present

## 2021-03-11 DIAGNOSIS — I10 Essential (primary) hypertension: Secondary | ICD-10-CM | POA: Diagnosis present

## 2021-03-11 DIAGNOSIS — R109 Unspecified abdominal pain: Secondary | ICD-10-CM | POA: Diagnosis not present

## 2021-03-11 DIAGNOSIS — Z8041 Family history of malignant neoplasm of ovary: Secondary | ICD-10-CM | POA: Diagnosis not present

## 2021-03-11 DIAGNOSIS — Z8249 Family history of ischemic heart disease and other diseases of the circulatory system: Secondary | ICD-10-CM | POA: Diagnosis not present

## 2021-03-11 DIAGNOSIS — Z20822 Contact with and (suspected) exposure to covid-19: Secondary | ICD-10-CM | POA: Diagnosis present

## 2021-03-11 DIAGNOSIS — Z803 Family history of malignant neoplasm of breast: Secondary | ICD-10-CM | POA: Diagnosis not present

## 2021-03-11 DIAGNOSIS — Z7984 Long term (current) use of oral hypoglycemic drugs: Secondary | ICD-10-CM | POA: Diagnosis not present

## 2021-03-11 DIAGNOSIS — M5416 Radiculopathy, lumbar region: Secondary | ICD-10-CM | POA: Diagnosis present

## 2021-03-11 DIAGNOSIS — Z7982 Long term (current) use of aspirin: Secondary | ICD-10-CM | POA: Diagnosis not present

## 2021-03-11 LAB — HIV ANTIBODY (ROUTINE TESTING W REFLEX): HIV Screen 4th Generation wRfx: NONREACTIVE

## 2021-03-11 LAB — COMPREHENSIVE METABOLIC PANEL
ALT: 27 U/L (ref 0–44)
AST: 21 U/L (ref 15–41)
Albumin: 4 g/dL (ref 3.5–5.0)
Alkaline Phosphatase: 62 U/L (ref 38–126)
Anion gap: 9 (ref 5–15)
BUN: 18 mg/dL (ref 8–23)
CO2: 23 mmol/L (ref 22–32)
Calcium: 9.1 mg/dL (ref 8.9–10.3)
Chloride: 107 mmol/L (ref 98–111)
Creatinine, Ser: 1.11 mg/dL (ref 0.61–1.24)
GFR, Estimated: >60 mL/min (ref >60)
Glucose, Bld: 151 mg/dL — ABNORMAL HIGH (ref 70–99)
Potassium: 4.1 mmol/L (ref 3.5–5.1)
Sodium: 139 mmol/L (ref 135–145)
Total Bilirubin: 1.2 mg/dL (ref 0.3–1.2)
Total Protein: 6.7 g/dL (ref 6.5–8.1)

## 2021-03-11 LAB — HEMOGLOBIN A1C
Hgb A1c MFr Bld: 7.5 % — ABNORMAL HIGH (ref 4.8–5.6)
Mean Plasma Glucose: 168.55 mg/dL

## 2021-03-11 LAB — SARS CORONAVIRUS 2 (TAT 6-24 HRS): SARS Coronavirus 2: NEGATIVE

## 2021-03-11 LAB — CBC
HCT: 38.9 % — ABNORMAL LOW (ref 39.0–52.0)
Hemoglobin: 13.3 g/dL (ref 13.0–17.0)
MCH: 31.6 pg (ref 26.0–34.0)
MCHC: 34.2 g/dL (ref 30.0–36.0)
MCV: 92.4 fL (ref 80.0–100.0)
Platelets: 225 10*3/uL (ref 150–400)
RBC: 4.21 MIL/uL — ABNORMAL LOW (ref 4.22–5.81)
RDW: 15.6 % — ABNORMAL HIGH (ref 11.5–15.5)
WBC: 8.1 10*3/uL (ref 4.0–10.5)
nRBC: 0 % (ref 0.0–0.2)

## 2021-03-11 LAB — GLUCOSE, CAPILLARY
Glucose-Capillary: 145 mg/dL — ABNORMAL HIGH (ref 70–99)
Glucose-Capillary: 128 mg/dL — ABNORMAL HIGH (ref 70–99)
Glucose-Capillary: 200 mg/dL — ABNORMAL HIGH (ref 70–99)
Glucose-Capillary: 204 mg/dL — ABNORMAL HIGH (ref 70–99)
Glucose-Capillary: 138 mg/dL — ABNORMAL HIGH (ref 70–99)

## 2021-03-11 LAB — D-DIMER, QUANTITATIVE: D-Dimer, Quant: <0.27 ug/mL-FEU (ref 0.00–0.50)

## 2021-03-11 LAB — LACTIC ACID, PLASMA: Lactic Acid, Venous: 1.4 mmol/L (ref 0.5–1.9)

## 2021-03-11 MED ORDER — SODIUM CHLORIDE 0.9 % IV SOLN: INTRAVENOUS | Status: AC

## 2021-03-11 MED ORDER — PROMETHAZINE HCL 25 MG PO TABS
12.5000 mg | ORAL_TABLET | Freq: Four times a day (QID) | ORAL | Status: DC | PRN
Start: 2021-03-11 — End: 2021-03-15

## 2021-03-11 MED ORDER — INSULIN GLARGINE 100 UNIT/ML ~~LOC~~ SOLN
15.0000 [IU] | Freq: Two times a day (BID) | SUBCUTANEOUS | Status: DC
  Administered 2021-03-11 – 2021-03-15 (×9): 15 [IU] via SUBCUTANEOUS
  Filled 2021-03-11 (×10): qty 0.15

## 2021-03-11 MED ORDER — IRBESARTAN 150 MG PO TABS
300.0000 mg | ORAL_TABLET | Freq: Every day | ORAL | Status: DC
  Administered 2021-03-11 – 2021-03-15 (×5): 300 mg via ORAL
  Filled 2021-03-11 (×5): qty 2

## 2021-03-11 MED ORDER — INSULIN ASPART 100 UNIT/ML ~~LOC~~ SOLN
0.0000 [IU] | Freq: Three times a day (TID) | SUBCUTANEOUS | Status: DC
  Administered 2021-03-11: 2 [IU] via SUBCUTANEOUS
  Administered 2021-03-11: 1 [IU] via SUBCUTANEOUS
  Administered 2021-03-11: 3 [IU] via SUBCUTANEOUS
  Administered 2021-03-12: 2 [IU] via SUBCUTANEOUS
  Administered 2021-03-12 (×2): 1 [IU] via SUBCUTANEOUS
  Administered 2021-03-13: 3 [IU] via SUBCUTANEOUS
  Administered 2021-03-13: 1 [IU] via SUBCUTANEOUS
  Administered 2021-03-14: 3 [IU] via SUBCUTANEOUS
  Administered 2021-03-15 (×2): 2 [IU] via SUBCUTANEOUS
  Filled 2021-03-11: qty 0.09

## 2021-03-11 MED ORDER — HYDROCODONE-ACETAMINOPHEN 5-325 MG PO TABS
1.0000 | ORAL_TABLET | ORAL | Status: DC | PRN
  Administered 2021-03-11 (×3): 2 via ORAL
  Administered 2021-03-12: 1 via ORAL
  Administered 2021-03-12 – 2021-03-15 (×7): 2 via ORAL
  Filled 2021-03-11 (×6): qty 2
  Filled 2021-03-11: qty 1
  Filled 2021-03-11 (×4): qty 2

## 2021-03-11 MED ORDER — SODIUM CHLORIDE 0.9 % IV SOLN: INTRAVENOUS | Status: DC

## 2021-03-11 MED ORDER — TRAZODONE HCL 100 MG PO TABS
200.0000 mg | ORAL_TABLET | Freq: Every day | ORAL | Status: DC
  Administered 2021-03-11 – 2021-03-14 (×5): 200 mg via ORAL
  Filled 2021-03-11 (×5): qty 2

## 2021-03-11 MED ORDER — PANTOPRAZOLE SODIUM 40 MG PO TBEC
40.0000 mg | DELAYED_RELEASE_TABLET | Freq: Every day | ORAL | Status: DC
  Administered 2021-03-11 – 2021-03-15 (×5): 40 mg via ORAL
  Filled 2021-03-11 (×5): qty 1

## 2021-03-11 MED ORDER — ACETAMINOPHEN 650 MG RE SUPP: 650.0000 mg | Freq: Four times a day (QID) | RECTAL | Status: DC | PRN

## 2021-03-11 MED ORDER — METHOCARBAMOL 500 MG PO TABS
750.0000 mg | ORAL_TABLET | Freq: Every day | ORAL | Status: DC | PRN
  Administered 2021-03-11: 750 mg via ORAL
  Filled 2021-03-11: qty 2

## 2021-03-11 MED ORDER — HYDROMORPHONE HCL 1 MG/ML IJ SOLN
1.0000 mg | INTRAMUSCULAR | Status: DC | PRN
  Administered 2021-03-11 – 2021-03-12 (×2): 1 mg via INTRAVENOUS
  Filled 2021-03-11 (×2): qty 1

## 2021-03-11 MED ORDER — KETOROLAC TROMETHAMINE 15 MG/ML IJ SOLN
15.0000 mg | Freq: Once | INTRAMUSCULAR | Status: AC | PRN
  Administered 2021-03-11: 15 mg via INTRAVENOUS
  Filled 2021-03-11: qty 1

## 2021-03-11 MED ORDER — ATORVASTATIN CALCIUM 10 MG PO TABS
10.0000 mg | ORAL_TABLET | Freq: Every day | ORAL | Status: DC
  Administered 2021-03-11 – 2021-03-15 (×5): 10 mg via ORAL
  Filled 2021-03-11 (×5): qty 1

## 2021-03-11 MED ORDER — METHOCARBAMOL 500 MG PO TABS
750.0000 mg | ORAL_TABLET | Freq: Three times a day (TID) | ORAL | Status: DC
  Administered 2021-03-11 – 2021-03-15 (×11): 750 mg via ORAL
  Filled 2021-03-11 (×12): qty 2

## 2021-03-11 MED ORDER — INSULIN ASPART 100 UNIT/ML ~~LOC~~ SOLN
0.0000 [IU] | Freq: Every day | SUBCUTANEOUS | Status: DC
  Administered 2021-03-13: 2 [IU] via SUBCUTANEOUS
  Filled 2021-03-11: qty 0.05

## 2021-03-11 MED ORDER — PREGABALIN 100 MG PO CAPS
200.0000 mg | ORAL_CAPSULE | Freq: Three times a day (TID) | ORAL | Status: DC
  Administered 2021-03-11 – 2021-03-15 (×11): 200 mg via ORAL
  Filled 2021-03-11 (×11): qty 2

## 2021-03-11 MED ORDER — ZOLPIDEM TARTRATE 5 MG PO TABS: 5.0000 mg | ORAL_TABLET | Freq: Every evening | ORAL | Status: DC | PRN

## 2021-03-11 MED ORDER — ACETAMINOPHEN 325 MG PO TABS: 650.0000 mg | ORAL_TABLET | Freq: Four times a day (QID) | ORAL | Status: DC | PRN

## 2021-03-11 MED ORDER — ASPIRIN EC 81 MG PO TBEC
81.0000 mg | DELAYED_RELEASE_TABLET | Freq: Every day | ORAL | Status: DC
  Administered 2021-03-11 – 2021-03-15 (×5): 81 mg via ORAL
  Filled 2021-03-11 (×5): qty 1

## 2021-03-11 MED ORDER — METOCLOPRAMIDE HCL 5 MG/ML IJ SOLN
5.0000 mg | Freq: Three times a day (TID) | INTRAMUSCULAR | Status: DC
  Administered 2021-03-11 – 2021-03-15 (×10): 5 mg via INTRAVENOUS
  Filled 2021-03-11 (×11): qty 2

## 2021-03-11 MED ORDER — TECHNETIUM TC 99M MEBROFENIN IV KIT
5.3000 | PACK | Freq: Once | INTRAVENOUS | Status: AC | PRN
  Administered 2021-03-11: 5.3 via INTRAVENOUS

## 2021-03-11 MED ORDER — ENOXAPARIN SODIUM 60 MG/0.6ML ~~LOC~~ SOLN
60.0000 mg | SUBCUTANEOUS | Status: DC
  Administered 2021-03-11 – 2021-03-15 (×5): 60 mg via SUBCUTANEOUS
  Filled 2021-03-11 (×5): qty 0.6

## 2021-03-11 NOTE — H&P (Signed)
History and Physical    Gary Grimes XTG:[id number] DOB: 10-22-1961 DOA: 03/10/2021  PCP: Hayden Rasmussen, MD   Patient coming from: Home  Chief Complaint: Abdominal pain, N/V/D   HPI: Gary Grimes is a 60 y.o. male with medical history significant for insulin-dependent diabetes mellitus, hypertension, insomnia, and coronary artery disease, now presenting to the emergency department for evaluation of severe abdominal pain.  Patient reports that he developed pain in the right upper quadrant of his abdomen and the right flank almost 2 weeks ago, has had some nausea and vomiting associated with this, and also reports some loose stools.  Pain has been fairly constant but had improved after pain medications in the emergency department 10 days ago before slowly becoming severe again.  He has been eating a bland diet, but this has not helped.  Pain sometimes seems to be worse after eating but not always.  He had never experienced this previously.  Denies any fevers or chills.  Denies any chest pain.  No shortness of breath or cough.  ED Course: Upon arrival to the ED, patient is found to be afebrile, saturating well on room air, mild bradycardia, and stable blood pressure.  EKG features a sinus rhythm and CT of the abdomen and pelvis is negative for acute findings.  Chemistry panel with glucose 159.  Lipase normal.  CBC is unremarkable.  Urinalysis unremarkable.  Patient was given a liter of saline, fentanyl, 2 doses of Dilaudid, Zofran, and droperidol in the ED but continues to complain of severe pain.  Review of Systems:  All other systems reviewed and apart from HPI, are negative.  Past Medical History:  Diagnosis Date  . Achilles tendon contracture, left   . Acquired equinus deformity of both feet 10/22/2019  . ADHD   . Anginal pain ([place])    ER visit 01/14/2014 visit on chart   . Anxiety    pt denies  . Arthritis   . Bipolar 1 disorder ([place]) 01/15/2020  . Bipolar disorder ([place])   .  Bipolar disorder, manic ([place]) 05/29/2016  . Chronic a-fib ([place]) 06/06/2018  . Chronic insomnia 08/01/2020  . Chronic lower back pain   . Chronic pansinusitis 04/28/2019  . Constipation   . Controlled type 2 diabetes mellitus, with long-term current use of insulin ([place]) 06/06/2018  . 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/19/2020  . Depression    did get seen in er 4/13 for evaluation-psyc situational none recent  . DOE (dyspnea on exertion) 11/29/2019  . Essential hypertension 06/06/2018  . GERD (gastroesophageal reflux disease)   . Headache    none recent  . High cholesterol   . High triglycerides   . HLD (hyperlipidemia) 01/15/2020  . Hypertension   . Increased body mass index 09/19/2020  . Joint pain   . Levator syndrome 2001   history   . Lumbar pain 09/19/2008   Qualifier: Diagnosis of  By: Aline Brochure MD, Dorothyann Peng    . Lumbar radiculopathy 09/19/2020  . 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 Mild CAD 05/17/2019  . Neuropathy   . OSA (obstructive sleep apnea) 01/15/2020  . OSA on CPAP   . Overweight 06/06/2018  . Pain in thoracic spine 09/19/2008   Qualifier: Diagnosis of  By: Aline Brochure MD, Dorothyann Peng    . Panic attacks   . Perirectal abscess   . Plantar fasciitis of left foot 02/28/2019  . Prediabetes   . Preoperative cardiovascular examination 05/17/2019  . S/P  ablation of atrial fibrillation   . S/P nasal septoplasty 05/31/2019  . Sleep apnea 06/06/2018  . SPONDYLOSIS 09/19/2008   Qualifier: Diagnosis of  By: Aline Brochure MD, Dorothyann Peng    . Type 1 diabetes mellitus ([place]) 09/19/2020  . Type II diabetes mellitus ([place])   . Type II diabetes mellitus with neurological manifestations ([place]) 02/28/2019  . Ventral incisional hernia 01/29/2015  . Vitamin D deficiency   . Work related injury 09/19/2020    Past Surgical History:  Procedure Laterality Date  . ANAL FISSURE REPAIR  08/05/2000   proctoscopy  . APPENDECTOMY  1984  . ATRIAL FIBRILLATION  ABLATION  10/28/2018  . ATRIAL FIBRILLATION ABLATION N/A 10/28/2018   Procedure: ATRIAL FIBRILLATION ABLATION;  Surgeon: Constance Haw, MD;  Location: [facility];  Service: Cardiovascular;  Laterality: N/A;  . BIOPSY  05/24/2019   Procedure: BIOPSY;  Surgeon: Ronald Lobo, MD;  Location: [facility];  Service: Endoscopy;;  . BIOPSY  08/10/2019   Procedure: BIOPSY;  Surgeon: Ronald Lobo, MD;  Location: [facility];  Service: Endoscopy;;  . COLONOSCOPY  2011  . COLONOSCOPY WITH PROPOFOL N/A 08/10/2019   Procedure: COLONOSCOPY WITH PROPOFOL;  Surgeon: Ronald Lobo, MD;  [facility]: [facility];  Service: Endoscopy;  Laterality: N/A;  . ESOPHAGOGASTRODUODENOSCOPY (EGD) WITH PROPOFOL N/A 05/24/2019   Procedure: ESOPHAGOGASTRODUODENOSCOPY (EGD) WITH PROPOFOL;  Surgeon: Ronald Lobo, MD;  [facility]: [facility];  Service: Endoscopy;  Laterality: N/A;  . GASTROCNEMIUS RECESSION Left 11/03/2019   Procedure: LEFT GASTROCNEMIUS RECESSION;  Surgeon: Newt Minion, MD;  Location: [facility];  Service: Orthopedics;  Laterality: Left;  . HERNIA REPAIR    . INSERTION OF MESH N/A 01/29/2015   Procedure: INSERTION OF MESH;  Surgeon: Excell Seltzer, MD;  Location: [facility];  Service: General;  Laterality: N/A;  . IRRIGATION AND DEBRIDEMENT ABSCESS  02/18/2012   peri-rectal  . LEFT HEART CATH AND CORONARY ANGIOGRAPHY N/A 06/08/2018   Procedure: LEFT HEART CATH AND CORONARY ANGIOGRAPHY;  Surgeon: Leonie Man, MD;  Location: [facility];  Service: Cardiovascular;  Laterality: N/A;  . LEFT HEART CATH AND CORONARY ANGIOGRAPHY N/A 10/18/2020   Procedure: LEFT HEART CATH AND CORONARY ANGIOGRAPHY;  Surgeon: Martinique, Peter M, MD;  Location: [facility];  Service: Cardiovascular;  Laterality: N/A;  . NASAL SEPTOPLASTY W/ TURBINOPLASTY  05/31/2019  . NASAL SEPTOPLASTY W/ TURBINOPLASTY Bilateral 05/31/2019   Procedure: NASAL SEPTOPLASTY WITH BILATERAL TURBINATE REDUCTION;  Surgeon: Leta Baptist, MD;  Location: [facility];  Service: ENT;  Laterality: Bilateral;  . PLANTAR FASCIA RELEASE Left 11/03/2019   Procedure: PLANTAR FASCIA RELEASE LEFT FOOT;  Surgeon: Newt Minion, MD;  Location: [facility];  Service: Orthopedics;  Laterality: Left;  . POLYPECTOMY  08/10/2019   Procedure: POLYPECTOMY;  Surgeon: Ronald Lobo, MD;  [facility];  Service: Endoscopy;;  . SHOULDER ARTHROSCOPY Left ?2009   "repaired  AC joint; reattached bicept tendon"  . SHOULDER ARTHROSCOPY W/ LABRAL REPAIR Left 08/08/2007  . UMBILICAL HERNIA REPAIR  10/27/2010  . VENTRAL HERNIA REPAIR N/A 01/29/2015   Procedure: LAPAROSCOPIC VENTRAL HERNIA;  Surgeon: Excell Seltzer, MD;  Location: [facility];  Service: General;  Laterality: N/A;    Social History:   reports that he has quit smoking. His smoking use included cigarettes. He has a 0.50 pack-year smoking history. He has never used smokeless tobacco. He reports previous alcohol use. He reports previous drug use.  Allergies  Allergen Reactions  . Morphine Other (See Comments)  PT BECAME DELIRIOUS  PT BECAME DELIRIOUS  PT BECAME DELIRIOUS     Family History  Problem Relation Age of Onset  . Breast cancer Mother   . Ovarian cancer Mother   . Diabetes Mother   . Hypertension Mother   . Hyperlipidemia Mother   . Heart disease Mother   . Sleep apnea Mother   . Obesity Mother   . Diabetes Father   . Hypertension Father   . Hyperlipidemia Father   . Heart disease Father   . Depression Father   . Anxiety disorder Father   . Bipolar disorder Father   . Sleep apnea Father   . Obesity Father      Prior to Admission medications   Medication Sig Start Date End Date Taking? Authorizing Provider  atorvastatin (LIPITOR) 10 MG tablet Take 10 mg by mouth daily.   Yes [provider]  BELSOMRA 20 MG TABS TAKE 20 MG BY MOUTH DAILY. 01/28/21  Yes Olalere, Adewale A, MD  hydrOXYzine (ATARAX/VISTARIL) 25 MG tablet Take 25 mg by mouth at bedtime.   Yes  [provider]  ibuprofen (ADVIL) 800 MG tablet every 6 (six) hours as needed for headache. 10/24/20  Yes [provider]  methocarbamol (ROBAXIN) 750 MG tablet Take 750 mg by mouth daily as needed for muscle spasms.   Yes [provider]  metoprolol tartrate (LOPRESSOR) 100 MG tablet Take 100 mg by mouth daily as needed. For high Blood pressure reading 10/24/20  Yes [provider]  nitroGLYCERIN (NITROSTAT) 0.4 MG SL tablet Place 0.4 mg under the tongue daily. Every 5 minutes for chest pain   Yes [provider]  NOVOLIN 70/30 (70-30) 100 UNIT/ML injection Inject 36 Units into the skin 2 (two) times daily with a meal. Per sliding scale ... 10/02/19  Yes [provider]  olmesartan (BENICAR) 40 MG tablet Take 40 mg by mouth daily.   Yes [provider]  omeprazole (PRILOSEC) 40 MG capsule Take 40 mg by mouth daily. 01/14/21  Yes [provider]  pregabalin (LYRICA) 150 MG capsule Take 1 capsule (150 mg total) by mouth 2 (two) times daily. 04/11/20  Yes Trula Slade, DPM  pregabalin (LYRICA) 200 MG capsule Take 200 mg by mouth 3 (three) times daily. 01/23/21  Yes [provider]  traZODone (DESYREL) 100 MG tablet Take 200 mg by mouth at bedtime as needed for sleep.   Yes [provider]  zolpidem (AMBIEN CR) 12.5 MG CR tablet Take 12.5 mg by mouth at bedtime as needed for sleep. 10/24/20  Yes [provider]  aspirin EC 81 MG tablet Take 1 tablet (81 mg total) by mouth daily. Swallow whole. Patient not taking: No sig reported 10/02/20   Revankar, Reita Cliche, MD  meloxicam (MOBIC) 7.5 MG tablet Take 1 tablet (7.5 mg total) by mouth daily. Patient not taking: Reported on 03/11/2021 06/28/20   Carole Civil, MD    Physical Exam: Vitals:   03/10/21 2120 03/10/21 2222 03/11/21 0000 03/11/21 0045  BP: (!) 152/83 126/77 126/76 125/77  Pulse: 63 (!) 56 61 60  Resp: 17 11 13 14   Temp:    98.5 F (36.9  C)  TempSrc:    Oral  SpO2: 95% 97% 97% 95%  Weight:      Height:        Constitutional: NAD, calm  Eyes: PERTLA, lids and conjunctivae normal ENMT: Mucous membranes are moist. Posterior pharynx clear of any exudate or lesions.  Neck: normal, supple, no masses, no thyromegaly Respiratory: no wheezing, no crackles. No accessory muscle use.  Cardiovascular: S1 & S2 heard, regular rate and rhythm. No extremity edema.   Abdomen: No distension, soft, tender in right lateral abdomen/right flank. Bowel sounds active.  Musculoskeletal: no clubbing / cyanosis. No joint deformity upper and lower extremities.   Skin: no significant rashes, lesions, ulcers. Warm, dry, well-perfused. Neurologic: CN 2-12 grossly intact. Sensation intact. Moving all extremities.  Psychiatric: Alert and oriented to person, place, and situation. Pleasant and cooperative.    Labs and Imaging on Admission: I have personally reviewed following labs and imaging studies  CBC: Recent Labs  Lab 03/10/21 1549  WBC 9.7  HGB 13.8  HCT 40.6  MCV 92.5  PLT 096   Basic Metabolic Panel: Recent Labs  Lab 03/10/21 1549  NA 139  K 4.4  CL 107  CO2 22  GLUCOSE 159*  BUN 23  CREATININE 1.14  CALCIUM 9.7   GFR: Estimated Creatinine Clearance: 92.6 mL/min (by C-G formula based on SCr of 1.14 mg/dL). Liver Function Tests: Recent Labs  Lab 03/10/21 1549  AST 24  ALT 29  ALKPHOS 70  BILITOT 1.0  PROT 7.2  ALBUMIN 4.4   Recent Labs  Lab 03/10/21 1549  LIPASE 47   No results for input(s): AMMONIA in the last 168 hours. Coagulation Profile: No results for input(s): INR, PROTIME in the last 168 hours. Cardiac Enzymes: No results for input(s): CKTOTAL, CKMB, CKMBINDEX, TROPONINI in the last 168 hours. BNP (last 3 results) No results for input(s): PROBNP in the last 8760 hours. HbA1C: No results for input(s): HGBA1C in the last 72 hours. CBG: No results for input(s): GLUCAP in the last 168 hours. Lipid  Profile: No results for input(s): CHOL, HDL, LDLCALC, TRIG, CHOLHDL, LDLDIRECT in the last 72 hours. Thyroid Function Tests: No results for input(s): TSH, T4TOTAL, FREET4, T3FREE, THYROIDAB in the last 72 hours. Anemia Panel: No results for input(s): VITAMINB12, FOLATE, FERRITIN, TIBC, IRON, RETICCTPCT in the last 72 hours. Urine analysis:    Component Value Date/Time   COLORURINE YELLOW 03/10/2021 1724   APPEARANCEUR CLEAR 03/10/2021 1724   LABSPEC 1.014 03/10/2021 1724   PHURINE 5.0 03/10/2021 1724   GLUCOSEU NEGATIVE 03/10/2021 1724   HGBUR NEGATIVE 03/10/2021 1724   BILIRUBINUR NEGATIVE 03/10/2021 1724   KETONESUR NEGATIVE 03/10/2021 1724   PROTEINUR NEGATIVE 03/10/2021 1724   UROBILINOGEN 0.2 04/13/2012 0140   NITRITE NEGATIVE 03/10/2021 1724   LEUKOCYTESUR NEGATIVE 03/10/2021 1724   Sepsis Labs: @LABRCNTIP (procalcitonin:4,lacticidven:4) )No results found for this or any previous visit (from the past 240 hour(s)).   Radiological Exams on Admission: CT ABDOMEN PELVIS W CONTRAST  Result Date: 03/10/2021 CLINICAL DATA:  Right-sided pain under right ribcage for 1.5 weeks, pain radiates to right lower back and right lower abdomen, nausea/vomiting/diarrhea EXAM: CT ABDOMEN AND PELVIS WITH CONTRAST TECHNIQUE: Multidetector CT imaging of the abdomen and pelvis was performed using the standard protocol following bolus administration of intravenous contrast. CONTRAST:  188mL OMNIPAQUE IOHEXOL 300 MG/ML  SOLN COMPARISON:  02/28/2021, 05/10/2019 FINDINGS: Lower chest: No acute pleural or parenchymal lung disease. Hepatobiliary: No focal liver abnormality is seen. No gallstones, gallbladder wall thickening, or biliary dilatation. Pancreas: Unremarkable. No pancreatic ductal dilatation or surrounding inflammatory changes. Spleen: Normal in size without focal abnormality. Adrenals/Urinary Tract: Stable 18 x 19 mm left adrenal nodule, likely adenoma given long-term stability. Right adrenals  unremarkable. Stable bilateral renal cortical cysts. No urinary tract calculi or obstructive uropathy. Bladder  is moderately distended with no gross abnormality. Stomach/Bowel: No bowel obstruction or ileus. Diverticulosis of the descending and sigmoid colon without diverticulitis. The appendix is surgically absent. No bowel wall thickening or inflammatory change. Vascular/Lymphatic: Aortic atherosclerosis. No enlarged abdominal or pelvic lymph nodes. Reproductive: Prostate is unremarkable. Other: No free fluid or free gas. No abdominal wall hernia. Stable postsurgical changes from prior ventral hernia repair. Musculoskeletal: There are no acute displaced fractures. Reconstructed images demonstrate no additional findings. IMPRESSION: 1. No acute intra-abdominal or intrapelvic process. 2. Diverticulosis without diverticulitis. 3. Stable left adrenal nodule, most consistent with adenoma given long-term stability since 2020. 4.  Aortic Atherosclerosis (ICD10-I70.0). Electronically Signed   By: Randa Ngo M.D.   On: 03/10/2021 19:30    EKG: Independently reviewed. Sinus rhythm, QTc 413.   Assessment/Plan   1. Intractable abdominal pain  - Presents with almost 2 weeks of severe pain in RUQ and right flank  - Recent CT and [name] were unrevealing, etiology not revealed on repeat CT tonight, and labs including LFTs and lipase are normal  - Check lactate and d-dimer and consider CTA abdomen if elevated, continue pain-control    2. Insulin-dependent DM  - A1c was 8.4% in August 2021  - Continue CBG checks and insulin    3. Hypertension  - BP at goal, continue ARB    4. CAD  - No anginal pain  - Continue ASA, and Lipitor   5. OSA  - Continue CPAP qHS     DVT prophylaxis: Lovenox  Code Status: Full  Level of Care: Level of care: Med-Surg Family Communication: None present  Disposition Plan:  Patient is from: home  Anticipated d/c is to: Home  Anticipated d/c date is: 03/11/21 Patient  currently: Pending pain-control  Consults called: none Admission status: Observation     Vianne Bulls, MD [facility]  03/11/2021, 1:05 AM

## 2021-03-11 NOTE — Progress Notes (Addendum)
PROGRESS NOTE    Gary Grimes  EQA:[id number] DOB: 15-May-1961 DOA: 03/10/2021 PCP: Hayden Rasmussen, MD     Brief Narrative:  Gary Grimes is a 60 y.o. male with medical history significant for insulin-dependent diabetes mellitus, hypertension, insomnia, and coronary artery disease, now presenting to the emergency department for evaluation of severe abdominal pain.  Patient reports that he developed pain in the right upper quadrant of his abdomen and the right flank almost 2 weeks ago, has had some nausea and vomiting associated with this, and also reports some loose stools.  Pain has been fairly constant but had improved after pain medications in the emergency department 10 days ago before slowly becoming severe again.  He has been eating a bland diet, but this has not helped.  Pain sometimes seems to be worse after eating but not always.    New events last 24 hours / Subjective: States that the pain has been pretty constant over the past couple of weeks, no alleviating factors, described as a sharp stabbing pain on the right upper quadrant radiating to the epigastrium.  Also with some nausea, vomiting, loose stools and lightheadedness.  States that his symptoms are severe that he has been missing work, affecting his daily life  Assessment & Plan:   Principal Problem:   Intractable abdominal pain Active Problems:   Essential hypertension   Type II diabetes mellitus with neurological manifestations (HCC)   Mild CAD   OSA on CPAP   Intractable abdominal pain, nausea, vomiting -Unclear etiology -Recent outpatient CT without acute findings, ultrasound showed liver steatosis, unremarkable gallbladder -Repeat CT on time of admission without acute intra-abdominal or intrapelvic process -Lactic acid normal -D-dimer normal -Lipase normal -LFT normal -HIDA scan normal -?gastroparesis vs musculoskeletal - ordered IV reglan and robaxin to see if it will help. Has seen [facility] in 2020,  had +gastric emptying study at that time  Insulin-dependent diabetes mellitus -Hemoglobin A1c 7.5 -Lantus, sliding scale insulin  Hypertension -Continue irbesartan   CAD -Continue aspirin, Lipitor  OSA -CPAP nightly   DVT prophylaxis: Lovenox   Code Status: Full code Family Communication: No family at bedside Disposition Plan:  Status is: Observation  The patient will require care spanning > 2 midnights and should be moved to inpatient because: Ongoing diagnostic testing needed not appropriate for outpatient work up  Dispo: The patient is from: Home              Anticipated d/c is to: Home              Patient currently is not medically stable to d/c.  Ongoing medical work-up   Difficult to place patient No    Antimicrobials:  Anti-infectives (From admission, onward)   None        Objective: Vitals:   03/11/21 0142 03/11/21 0505 03/11/21 0947 03/11/21 1201  BP:  (!) 153/78 (!) 141/76 138/81  Pulse:  (!) 58 (!) 58 65  Resp:  16 15   Temp:  98.3 F (36.8 C) 98.2 F (36.8 C)   TempSrc:  Oral    SpO2:  98% 99%   Weight: 126.2 kg     Height: 6' (1.829 m)       Intake/Output Summary (Last 24 hours) at 03/11/2021 1257 Last data filed at 03/11/2021 0900 Gross per 24 hour  Intake 240 ml  Output 400 ml  Net -160 ml   Filed Weights   03/10/21 1249 03/11/21 0142  Weight: 127 kg 126.2  kg    Examination:  General exam: Appears calm and comfortable  Respiratory system: Clear to auscultation. Respiratory effort normal. No respiratory distress. No conversational dyspnea.  Cardiovascular system: S1 & S2 heard, RRR. No murmurs. No pedal edema. Gastrointestinal system: Abdomen is nondistended, soft and tender to palpation right upper quadrant Central nervous system: Alert and oriented. No focal neurological deficits. Speech clear.  Extremities: Symmetric in appearance  Skin: No rashes, lesions or ulcers on exposed skin  Psychiatry: Judgement and insight appear  normal. Mood & affect appropriate.   Data Reviewed: I have personally reviewed following labs and imaging studies  CBC: Recent Labs  Lab 03/10/21 1549 03/11/21 0227  WBC 9.7 8.1  HGB 13.8 13.3  HCT 40.6 38.9*  MCV 92.5 92.4  PLT 226 301   Basic Metabolic Panel: Recent Labs  Lab 03/10/21 1549 03/11/21 0227  NA 139 139  K 4.4 4.1  CL 107 107  CO2 22 23  GLUCOSE 159* 151*  BUN 23 18  CREATININE 1.14 1.11  CALCIUM 9.7 9.1   GFR: Estimated Creatinine Clearance: 94.7 mL/min (by C-G formula based on SCr of 1.11 mg/dL). Liver Function Tests: Recent Labs  Lab 03/10/21 1549 03/11/21 0227  AST 24 21  ALT 29 27  ALKPHOS 70 62  BILITOT 1.0 1.2  PROT 7.2 6.7  ALBUMIN 4.4 4.0   Recent Labs  Lab 03/10/21 1549  LIPASE 47   No results for input(s): AMMONIA in the last 168 hours. Coagulation Profile: No results for input(s): INR, PROTIME in the last 168 hours. Cardiac Enzymes: No results for input(s): CKTOTAL, CKMB, CKMBINDEX, TROPONINI in the last 168 hours. BNP (last 3 results) No results for input(s): PROBNP in the last 8760 hours. HbA1C: Recent Labs    03/11/21 0227  HGBA1C 7.5*   CBG: Recent Labs  Lab 03/11/21 0156 03/11/21 0747  GLUCAP 145* 128*   Lipid Profile: No results for input(s): CHOL, HDL, LDLCALC, TRIG, CHOLHDL, LDLDIRECT in the last 72 hours. Thyroid Function Tests: No results for input(s): TSH, T4TOTAL, FREET4, T3FREE, THYROIDAB in the last 72 hours. Anemia Panel: No results for input(s): VITAMINB12, FOLATE, FERRITIN, TIBC, IRON, RETICCTPCT in the last 72 hours. Sepsis Labs: Recent Labs  Lab 03/11/21 0227  LATICACIDVEN 1.4    Recent Results (from the past 240 hour(s))  SARS CORONAVIRUS 2 (TAT 6-24 HRS) Nasopharyngeal Nasopharyngeal Swab     Status: None   Collection Time: 03/10/21 10:58 PM   Specimen: Nasopharyngeal Swab  Result Value Ref Range Status   SARS Coronavirus 2 NEGATIVE NEGATIVE Final    Comment: (NOTE) SARS-CoV-2 target  nucleic acids are NOT DETECTED.  The SARS-CoV-2 RNA is generally detectable in upper and lower respiratory specimens during the acute phase of infection. Negative results do not preclude SARS-CoV-2 infection, do not rule out co-infections with other pathogens, and should not be used as the sole basis for treatment or other patient management decisions. Negative results must be combined with clinical observations, patient history, and epidemiological information. The expected result is Negative.  Fact Sheet for Patients: SugarRoll.be  Fact Sheet for Healthcare Providers: https://www.woods-mathews.com/  This test is not yet approved or cleared by the [place] FDA and  has been authorized for detection and/or diagnosis of SARS-CoV-2 by FDA under an Emergency Use Authorization (EUA). This EUA will remain  in effect (meaning this test can be used) for the duration of the COVID-19 declaration under Se ction 564(b)(1) of the Act, 21 U.S.C. section 360bbb-3(b)(1), unless the authorization  is terminated or revoked sooner.  Performed at [facility], [place] [address], [place], [place] [address]       Radiology Studies: CT ABDOMEN PELVIS W CONTRAST  Result Date: 03/10/2021 CLINICAL DATA:  Right-sided pain under right ribcage for 1.5 weeks, pain radiates to right lower back and right lower abdomen, nausea/vomiting/diarrhea EXAM: CT ABDOMEN AND PELVIS WITH CONTRAST TECHNIQUE: Multidetector CT imaging of the abdomen and pelvis was performed using the standard protocol following bolus administration of intravenous contrast. CONTRAST:  166mL OMNIPAQUE IOHEXOL 300 MG/ML  SOLN COMPARISON:  02/28/2021, 05/10/2019 FINDINGS: Lower chest: No acute pleural or parenchymal lung disease. Hepatobiliary: No focal liver abnormality is seen. No gallstones, gallbladder wall thickening, or biliary dilatation. Pancreas: Unremarkable. No pancreatic ductal dilatation  or surrounding inflammatory changes. Spleen: Normal in size without focal abnormality. Adrenals/Urinary Tract: Stable 18 x 19 mm left adrenal nodule, likely adenoma given long-term stability. Right adrenals unremarkable. Stable bilateral renal cortical cysts. No urinary tract calculi or obstructive uropathy. Bladder is moderately distended with no gross abnormality. Stomach/Bowel: No bowel obstruction or ileus. Diverticulosis of the descending and sigmoid colon without diverticulitis. The appendix is surgically absent. No bowel wall thickening or inflammatory change. Vascular/Lymphatic: Aortic atherosclerosis. No enlarged abdominal or pelvic lymph nodes. Reproductive: Prostate is unremarkable. Other: No free fluid or free gas. No abdominal wall hernia. Stable postsurgical changes from prior ventral hernia repair. Musculoskeletal: There are no acute displaced fractures. Reconstructed images demonstrate no additional findings. IMPRESSION: 1. No acute intra-abdominal or intrapelvic process. 2. Diverticulosis without diverticulitis. 3. Stable left adrenal nodule, most consistent with adenoma given long-term stability since 2020. 4.  Aortic Atherosclerosis (ICD10-I70.0). Electronically Signed   By: Randa Ngo M.D.   On: 03/10/2021 19:30      Scheduled Meds: . aspirin EC  81 mg Oral Daily  . atorvastatin  10 mg Oral Daily  . enoxaparin (LOVENOX) injection  60 mg Subcutaneous Q24H  . insulin aspart  0-5 Units Subcutaneous QHS  . insulin aspart  0-9 Units Subcutaneous TID WC  . insulin glargine  15 Units Subcutaneous BID  . irbesartan  300 mg Oral Daily  . pantoprazole  40 mg Oral Daily  . pregabalin  200 mg Oral TID  . traZODone  200 mg Oral QHS   Continuous Infusions:   LOS: 0 days      Time spent: 25 minutes   Dessa Phi, DO [facility] 03/11/2021, 12:57 PM   Available via Epic secure chat 7am-7pm After these hours, please refer to coverage provider listed on [email or web address]

## 2021-03-12 DIAGNOSIS — E1149 Type 2 diabetes mellitus with other diabetic neurological complication: Secondary | ICD-10-CM

## 2021-03-12 DIAGNOSIS — G4733 Obstructive sleep apnea (adult) (pediatric): Secondary | ICD-10-CM

## 2021-03-12 DIAGNOSIS — Z9989 Dependence on other enabling machines and devices: Secondary | ICD-10-CM

## 2021-03-12 DIAGNOSIS — R109 Unspecified abdominal pain: Secondary | ICD-10-CM

## 2021-03-12 DIAGNOSIS — I10 Essential (primary) hypertension: Secondary | ICD-10-CM

## 2021-03-12 LAB — GLUCOSE, CAPILLARY
Glucose-Capillary: 152 mg/dL — ABNORMAL HIGH (ref 70–99)
Glucose-Capillary: 139 mg/dL — ABNORMAL HIGH (ref 70–99)
Glucose-Capillary: 136 mg/dL — ABNORMAL HIGH (ref 70–99)
Glucose-Capillary: 184 mg/dL — ABNORMAL HIGH (ref 70–99)

## 2021-03-12 NOTE — Progress Notes (Signed)
PROGRESS NOTE    Gary Grimes  FTD:[id number] DOB: 08-Aug-1961 DOA: 03/10/2021 PCP: Hayden Rasmussen, MD   Brief Narrative: Gary Grimes is a 60 y.o. male with a history of diabetes mellitus, hypertension, insomnia, CAD. Patient presented secondary to abdominal pain, nausea and vomiting. Workup unremarkable. Patient managed symptomatic with minimal improvement and then started on Reglan IV with more improvement.   Assessment & Plan:   Principal Problem:   Intractable abdominal pain Active Problems:   Essential hypertension   Type II diabetes mellitus with neurological manifestations (HCC)   Mild CAD   OSA on CPAP   Intractable nausea/vomiting Abdominal pain Probable gastroparesis Outpatient CT for similar issues was unremarkable for an acute process. HIDA scan was negative for acute cholecystitis. History of abnormal gastric emptying study suggesting possible gastroparesis. Reglan IV started. Patient now with some improvement. -Continue Reglan IV (discussed risks/benefits of the use of Reglan, including the development of Tardive Dyskinesia). -Continue Phenergan prn -Continue Protonix -Minimize narcotics -Full liquid diet today  Diabetes mellitus, type 2 Patient is on Novolin 70/30 as an outpatient. Last hemoglobin A1C of 7.5%. -Continue Lantus 15 units BID and SSI  Primary hypertension Patient is on olmesartan daily in addition to metoprolol tartrate as needed -Continue irbesartan  CAD -Continue Lipitor and aspirin  OSA -Continue CPAP nightly  Lumbar radiculopathy Patient is on high doses of Lyrica (200 mg TID). -Continue Lyrica  Insomnia Patient is on Belsomra and Ambien -Continue Ambien   DVT prophylaxis: Lovenox Code Status:   Code Status: Full Code Family Communication: None at bedside Disposition Plan: Discharge home likely in 24 hours pending ability to advance diet successfully   Consultants:   None  Procedures:    None  Antimicrobials:  None    Subjective: Abdominal pain improved today. No emesis overnight.  Objective: Vitals:   03/11/21 1434 03/11/21 2148 03/11/21 2303 03/12/21 0500  BP: (!) 146/82 107/68  110/69  Pulse: 73 69 61 69  Resp: 16 17 18 17   Temp: 98.3 F (36.8 C) 98.4 F (36.9 C)  98.3 F (36.8 C)  TempSrc:    Oral  SpO2: 100% 98% 97% 98%  Weight:      Height:        Intake/Output Summary (Last 24 hours) at 03/12/2021 1317 Last data filed at 03/12/2021 1300 Gross per 24 hour  Intake 1843.99 ml  Output 2750 ml  Net -906.01 ml   Filed Weights   03/10/21 1249 03/11/21 0142  Weight: 127 kg 126.2 kg    Examination:  General exam: Appears calm and comfortable Respiratory system: Clear to auscultation. Respiratory effort normal. Cardiovascular system: S1 & S2 heard, RRR. No murmurs, rubs, gallops or clicks. Gastrointestinal system: Abdomen is nondistended, soft and nontender. No organomegaly or masses felt. Normal bowel sounds heard. Central nervous system: Alert and oriented. No focal neurological deficits. Musculoskeletal: No edema. No calf tenderness Skin: No cyanosis. No rashes Psychiatry: Judgement and insight appear normal. Mood & affect appropriate.     Data Reviewed: I have personally reviewed following labs and imaging studies  CBC Lab Results  Component Value Date   WBC 8.1 03/11/2021   RBC 4.21 (L) 03/11/2021   HGB 13.3 03/11/2021   HCT 38.9 (L) 03/11/2021   MCV 92.4 03/11/2021   MCH 31.6 03/11/2021   PLT 225 03/11/2021   MCHC 34.2 03/11/2021   RDW 15.6 (H) 03/11/2021   LYMPHSABS 3.3 02/28/2021   MONOABS 0.8 02/28/2021   EOSABS 0.2 02/28/2021  BASOSABS 0.0 60/45/4098     Last metabolic panel Lab Results  Component Value Date   NA 139 03/11/2021   K 4.1 03/11/2021   CL 107 03/11/2021   CO2 23 03/11/2021   BUN 18 03/11/2021   CREATININE 1.11 03/11/2021   GLUCOSE 151 (H) 03/11/2021   GFRNONAA >60 03/11/2021   GFRAA 65  10/14/2020   CALCIUM 9.1 03/11/2021   PROT 6.7 03/11/2021   ALBUMIN 4.0 03/11/2021   LABGLOB 2.8 08/06/2020   AGRATIO 1.6 08/06/2020   BILITOT 1.2 03/11/2021   ALKPHOS 62 03/11/2021   AST 21 03/11/2021   ALT 27 03/11/2021   ANIONGAP 9 03/11/2021    CBG (last 3)  Recent Labs    03/11/21 2229 03/12/21 0757 03/12/21 1159  GLUCAP 138* 152* 139*     GFR: Estimated Creatinine Clearance: 94.7 mL/min (by C-G formula based on SCr of 1.11 mg/dL).  Coagulation Profile: No results for input(s): INR, PROTIME in the last 168 hours.  Recent Results (from the past 240 hour(s))  SARS CORONAVIRUS 2 (TAT 6-24 HRS) Nasopharyngeal Nasopharyngeal Swab     Status: None   Collection Time: 03/10/21 10:58 PM   Specimen: Nasopharyngeal Swab  Result Value Ref Range Status   SARS Coronavirus 2 NEGATIVE NEGATIVE Final    Comment: (NOTE) SARS-CoV-2 target nucleic acids are NOT DETECTED.  The SARS-CoV-2 RNA is generally detectable in upper and lower respiratory specimens during the acute phase of infection. Negative results do not preclude SARS-CoV-2 infection, do not rule out co-infections with other pathogens, and should not be used as the sole basis for treatment or other patient management decisions. Negative results must be combined with clinical observations, patient history, and epidemiological information. The expected result is Negative.  Fact Sheet for Patients: SugarRoll.be  Fact Sheet for Healthcare Providers: https://www.woods-mathews.com/  This test is not yet approved or cleared by the [place] FDA and  has been authorized for detection and/or diagnosis of SARS-CoV-2 by FDA under an Emergency Use Authorization (EUA). This EUA will remain  in effect (meaning this test can be used) for the duration of the COVID-19 declaration under Se ction 564(b)(1) of the Act, 21 U.S.C. section 360bbb-3(b)(1), unless the authorization is terminated  or revoked sooner.  Performed at [facility], [place] [address] [place], [place] [address]         Radiology Studies: CT ABDOMEN PELVIS W CONTRAST  Result Date: 03/10/2021 CLINICAL DATA:  Right-sided pain under right ribcage for 1.5 weeks, pain radiates to right lower back and right lower abdomen, nausea/vomiting/diarrhea EXAM: CT ABDOMEN AND PELVIS WITH CONTRAST TECHNIQUE: Multidetector CT imaging of the abdomen and pelvis was performed using the standard protocol following bolus administration of intravenous contrast. CONTRAST:  139mL OMNIPAQUE IOHEXOL 300 MG/ML  SOLN COMPARISON:  02/28/2021, 05/10/2019 FINDINGS: Lower chest: No acute pleural or parenchymal lung disease. Hepatobiliary: No focal liver abnormality is seen. No gallstones, gallbladder wall thickening, or biliary dilatation. Pancreas: Unremarkable. No pancreatic ductal dilatation or surrounding inflammatory changes. Spleen: Normal in size without focal abnormality. Adrenals/Urinary Tract: Stable 18 x 19 mm left adrenal nodule, likely adenoma given long-term stability. Right adrenals unremarkable. Stable bilateral renal cortical cysts. No urinary tract calculi or obstructive uropathy. Bladder is moderately distended with no gross abnormality. Stomach/Bowel: No bowel obstruction or ileus. Diverticulosis of the descending and sigmoid colon without diverticulitis. The appendix is surgically absent. No bowel wall thickening or inflammatory change. Vascular/Lymphatic: Aortic atherosclerosis. No enlarged abdominal or pelvic lymph nodes. Reproductive: Prostate  is unremarkable. Other: No free fluid or free gas. No abdominal wall hernia. Stable postsurgical changes from prior ventral hernia repair. Musculoskeletal: There are no acute displaced fractures. Reconstructed images demonstrate no additional findings. IMPRESSION: 1. No acute intra-abdominal or intrapelvic process. 2. Diverticulosis without diverticulitis. 3. Stable left adrenal  nodule, most consistent with adenoma given long-term stability since 2020. 4.  Aortic Atherosclerosis (ICD10-I70.0). Electronically Signed   By: Randa Ngo M.D.   On: 03/10/2021 19:30   NM Hepato W/EF  Result Date: 03/11/2021 CLINICAL DATA:  Chronic upper abdominal pain EXAM: NUCLEAR MEDICINE HEPATOBILIARY IMAGING WITH GALLBLADDER EF TECHNIQUE: Sequential images of the abdomen were obtained out to 60 minutes following intravenous administration of radiopharmaceutical. After oral ingestion of Ensure, gallbladder ejection fraction was determined. At 60 min, normal ejection fraction is greater than 33%. RADIOPHARMACEUTICALS:  5.3 mCi Tc-28m  Choletec IV COMPARISON:  CT abdomen 03/10/2021 FINDINGS: Satisfactory uptake of radiopharmaceutical from the blood pool. Biliary activity is visible at 9 minutes. Gallbladder activity is visible at 11 minutes. Bowel activity is visible at 60 minutes. The patient did experience abdominal pain after drinking Ensure. Calculated gallbladder ejection fraction is 59%. (Normal gallbladder ejection fraction with Ensure is greater than 33%.) IMPRESSION: 1. Overall normal hepatobiliary nuclear medicine scan. Gallbladder ejection fraction is 59% over 1 hour, within normal limits. Electronically Signed   By: Van Clines M.D.   On: 03/11/2021 14:50        Scheduled Meds: . aspirin EC  81 mg Oral Daily  . atorvastatin  10 mg Oral Daily  . enoxaparin (LOVENOX) injection  60 mg Subcutaneous Q24H  . insulin aspart  0-5 Units Subcutaneous QHS  . insulin aspart  0-9 Units Subcutaneous TID WC  . insulin glargine  15 Units Subcutaneous BID  . irbesartan  300 mg Oral Daily  . methocarbamol  750 mg Oral TID  . metoCLOPramide (REGLAN) injection  5 mg Intravenous Q8H  . pantoprazole  40 mg Oral Daily  . pregabalin  200 mg Oral TID  . traZODone  200 mg Oral QHS   Continuous Infusions: . sodium chloride 75 mL/hr at 03/12/21 0500     LOS: 1 day     Cordelia Poche,  MD [facility] 03/12/2021, 1:17 PM  If 7PM-7AM, please contact night-coverage [email or web address]

## 2021-03-13 ENCOUNTER — Ambulatory Visit: Payer: PRIVATE HEALTH INSURANCE | Admitting: Cardiology

## 2021-03-13 ENCOUNTER — Inpatient Hospital Stay (HOSPITAL_COMMUNITY): Payer: No Typology Code available for payment source

## 2021-03-13 LAB — GLUCOSE, CAPILLARY
Glucose-Capillary: 127 mg/dL — ABNORMAL HIGH (ref 70–99)
Glucose-Capillary: 208 mg/dL — ABNORMAL HIGH (ref 70–99)
Glucose-Capillary: 111 mg/dL — ABNORMAL HIGH (ref 70–99)
Glucose-Capillary: 201 mg/dL — ABNORMAL HIGH (ref 70–99)

## 2021-03-13 MED ORDER — BISACODYL 10 MG RE SUPP
10.0000 mg | Freq: Once | RECTAL | Status: AC
  Administered 2021-03-13: 10 mg via RECTAL
  Filled 2021-03-13: qty 1

## 2021-03-13 MED ORDER — POLYETHYLENE GLYCOL 3350 17 G PO PACK
17.0000 g | PACK | Freq: Every day | ORAL | Status: DC
  Administered 2021-03-13: 17 g via ORAL
  Filled 2021-03-13: qty 1

## 2021-03-13 MED ORDER — SENNOSIDES-DOCUSATE SODIUM 8.6-50 MG PO TABS
1.0000 | ORAL_TABLET | Freq: Two times a day (BID) | ORAL | Status: DC
  Administered 2021-03-13 – 2021-03-15 (×5): 1 via ORAL
  Filled 2021-03-13 (×5): qty 1

## 2021-03-13 NOTE — Progress Notes (Signed)
PROGRESS NOTE    Gary Grimes  KVQ:[id number] DOB: 08-18-1961 DOA: 03/10/2021 PCP: Hayden Rasmussen, MD   Brief Narrative: Gary Grimes is a 60 y.o. male with a history of diabetes mellitus, hypertension, insomnia, CAD. Patient presented secondary to abdominal pain, nausea and vomiting. Workup unremarkable. Patient managed symptomatic with minimal improvement and then started on Reglan IV with more improvement.   Assessment & Plan:   Principal Problem:   Intractable abdominal pain Active Problems:   Essential hypertension   Type II diabetes mellitus with neurological manifestations (HCC)   Mild CAD   OSA on CPAP   Intractable nausea/vomiting Abdominal pain Probable gastroparesis Outpatient CT for similar issues was unremarkable for an acute process. HIDA scan was negative for acute cholecystitis. History of abnormal gastric emptying study suggesting possible gastroparesis. Reglan IV started with improvement initially. Some pain after advancing to a soft diet. -Continue Reglan IV (discussed risks/benefits of the use of Reglan, including the development of Tardive Dyskinesia). -Continue Phenergan prn -Continue Protonix -Minimize narcotics -Continue soft diet to see if symptoms improve by tomorrow  Diabetes mellitus, type 2 Patient is on Novolin 70/30 as an outpatient. Last hemoglobin A1C of 7.5%. -Continue Lantus 15 units BID and SSI  Primary hypertension Patient is on olmesartan daily in addition to metoprolol tartrate as needed -Continue irbesartan  CAD -Continue Lipitor and aspirin  OSA -Continue CPAP nightly  Lumbar radiculopathy Patient is on high doses of Lyrica (200 mg TID). -Continue Lyrica  Insomnia Patient is on Belsomra and Ambien -Continue Ambien   DVT prophylaxis: Lovenox Code Status:   Code Status: Full Code Family Communication: None at bedside Disposition Plan: Discharge home likely in 24 hours pending ability to advance diet  successfully; pain was significant today   Consultants:   None  Procedures:   None  Antimicrobials:  None    Subjective: Pain resolved overnight. Severe pain after attempting a more solid diet  Objective: Vitals:   03/13/21 0003 03/13/21 0642 03/13/21 0700 03/13/21 0732  BP:  118/72  131/74  Pulse: (!) 55 (!) 54  (!) 55  Resp: 20 18  17   Temp:  97.9 F (36.6 C)  97.8 F (36.6 C)  TempSrc:  Oral Axillary Axillary  SpO2: 97% 99%  98%  Weight:      Height:        Intake/Output Summary (Last 24 hours) at 03/13/2021 1441 Last data filed at 03/13/2021 1347 Gross per 24 hour  Intake 3127.12 ml  Output 3300 ml  Net -172.88 ml   Filed Weights   03/10/21 1249 03/11/21 0142  Weight: 127 kg 126.2 kg    Examination:  General exam: Appears calm and comfortable Respiratory system: Clear to auscultation. Respiratory effort normal. Cardiovascular system: S1 & S2 heard, RRR. No murmurs, rubs, gallops or clicks. Gastrointestinal system: Abdomen is nondistended, soft and nontender. No organomegaly or masses felt. Normal bowel sounds heard. Central nervous system: Alert and oriented. No focal neurological deficits. Musculoskeletal: No edema. No calf tenderness Skin: No cyanosis. No rashes Psychiatry: Judgement and insight appear normal. Mood & affect appropriate.     Data Reviewed: I have personally reviewed following labs and imaging studies  CBC Lab Results  Component Value Date   WBC 8.1 03/11/2021   RBC 4.21 (L) 03/11/2021   HGB 13.3 03/11/2021   HCT 38.9 (L) 03/11/2021   MCV 92.4 03/11/2021   MCH 31.6 03/11/2021   PLT 225 03/11/2021   MCHC 34.2 03/11/2021   RDW  15.6 (H) 03/11/2021   LYMPHSABS 3.3 02/28/2021   MONOABS 0.8 02/28/2021   EOSABS 0.2 02/28/2021   BASOSABS 0.0 57/32/2025     Last metabolic panel Lab Results  Component Value Date   NA 139 03/11/2021   K 4.1 03/11/2021   CL 107 03/11/2021   CO2 23 03/11/2021   BUN 18 03/11/2021    CREATININE 1.11 03/11/2021   GLUCOSE 151 (H) 03/11/2021   GFRNONAA >60 03/11/2021   GFRAA 65 10/14/2020   CALCIUM 9.1 03/11/2021   PROT 6.7 03/11/2021   ALBUMIN 4.0 03/11/2021   LABGLOB 2.8 08/06/2020   AGRATIO 1.6 08/06/2020   BILITOT 1.2 03/11/2021   ALKPHOS 62 03/11/2021   AST 21 03/11/2021   ALT 27 03/11/2021   ANIONGAP 9 03/11/2021    CBG (last 3)  Recent Labs    03/12/21 2104 03/13/21 0737 03/13/21 1142  GLUCAP 184* 127* 208*     GFR: Estimated Creatinine Clearance: 94.7 mL/min (by C-G formula based on SCr of 1.11 mg/dL).  Coagulation Profile: No results for input(s): INR, PROTIME in the last 168 hours.  Recent Results (from the past 240 hour(s))  SARS CORONAVIRUS 2 (TAT 6-24 HRS) Nasopharyngeal Nasopharyngeal Swab     Status: None   Collection Time: 03/10/21 10:58 PM   Specimen: Nasopharyngeal Swab  Result Value Ref Range Status   SARS Coronavirus 2 NEGATIVE NEGATIVE Final    Comment: (NOTE) SARS-CoV-2 target nucleic acids are NOT DETECTED.  The SARS-CoV-2 RNA is generally detectable in upper and lower respiratory specimens during the acute phase of infection. Negative results do not preclude SARS-CoV-2 infection, do not rule out co-infections with other pathogens, and should not be used as the sole basis for treatment or other patient management decisions. Negative results must be combined with clinical observations, patient history, and epidemiological information. The expected result is Negative.  Fact Sheet for Patients: SugarRoll.be  Fact Sheet for Healthcare Providers: https://www.woods-mathews.com/  This test is not yet approved or cleared by the [place] FDA and  has been authorized for detection and/or diagnosis of SARS-CoV-2 by FDA under an Emergency Use Authorization (EUA). This EUA will remain  in effect (meaning this test can be used) for the duration of the COVID-19 declaration under Se ction  564(b)(1) of the Act, 21 U.S.C. section 360bbb-3(b)(1), unless the authorization is terminated or revoked sooner.  Performed at [facility], [place] [address] [place], [place] [address]         Radiology Studies: NM Hepato W/EF  Result Date: 03/11/2021 CLINICAL DATA:  Chronic upper abdominal pain EXAM: NUCLEAR MEDICINE HEPATOBILIARY IMAGING WITH GALLBLADDER EF TECHNIQUE: Sequential images of the abdomen were obtained out to 60 minutes following intravenous administration of radiopharmaceutical. After oral ingestion of Ensure, gallbladder ejection fraction was determined. At 60 min, normal ejection fraction is greater than 33%. RADIOPHARMACEUTICALS:  5.3 mCi Tc-31m  Choletec IV COMPARISON:  CT abdomen 03/10/2021 FINDINGS: Satisfactory uptake of radiopharmaceutical from the blood pool. Biliary activity is visible at 9 minutes. Gallbladder activity is visible at 11 minutes. Bowel activity is visible at 60 minutes. The patient did experience abdominal pain after drinking Ensure. Calculated gallbladder ejection fraction is 59%. (Normal gallbladder ejection fraction with Ensure is greater than 33%.) IMPRESSION: 1. Overall normal hepatobiliary nuclear medicine scan. Gallbladder ejection fraction is 59% over 1 hour, within normal limits. Electronically Signed   By: Van Clines M.D.   On: 03/11/2021 14:50        Scheduled Meds: . aspirin  EC  81 mg Oral Daily  . atorvastatin  10 mg Oral Daily  . enoxaparin (LOVENOX) injection  60 mg Subcutaneous Q24H  . insulin aspart  0-5 Units Subcutaneous QHS  . insulin aspart  0-9 Units Subcutaneous TID WC  . insulin glargine  15 Units Subcutaneous BID  . irbesartan  300 mg Oral Daily  . methocarbamol  750 mg Oral TID  . metoCLOPramide (REGLAN) injection  5 mg Intravenous Q8H  . pantoprazole  40 mg Oral Daily  . polyethylene glycol  17 g Oral Daily  . pregabalin  200 mg Oral TID  . senna-docusate  1 tablet Oral BID  . traZODone  200 mg Oral  QHS   Continuous Infusions:    LOS: 2 days     Cordelia Poche, MD [facility] 03/13/2021, 2:41 PM  If 7PM-7AM, please contact night-coverage [email or web address]

## 2021-03-14 ENCOUNTER — Inpatient Hospital Stay (HOSPITAL_COMMUNITY): Payer: No Typology Code available for payment source

## 2021-03-14 DIAGNOSIS — K5900 Constipation, unspecified: Secondary | ICD-10-CM

## 2021-03-14 LAB — COMPREHENSIVE METABOLIC PANEL
ALT: 31 U/L (ref 0–44)
AST: 28 U/L (ref 15–41)
Albumin: 4.5 g/dL (ref 3.5–5.0)
Alkaline Phosphatase: 72 U/L (ref 38–126)
Anion gap: 11 (ref 5–15)
BUN: 11 mg/dL (ref 8–23)
CO2: 28 mmol/L (ref 22–32)
Calcium: 9.7 mg/dL (ref 8.9–10.3)
Chloride: 102 mmol/L (ref 98–111)
Creatinine, Ser: 1.18 mg/dL (ref 0.61–1.24)
GFR, Estimated: >60 mL/min (ref >60)
Glucose, Bld: 155 mg/dL — ABNORMAL HIGH (ref 70–99)
Potassium: 3.7 mmol/L (ref 3.5–5.1)
Sodium: 141 mmol/L (ref 135–145)
Total Bilirubin: 1.3 mg/dL — ABNORMAL HIGH (ref 0.3–1.2)
Total Protein: 7.6 g/dL (ref 6.5–8.1)

## 2021-03-14 LAB — LIPASE, BLOOD: Lipase: 45 U/L (ref 11–51)

## 2021-03-14 LAB — GLUCOSE, CAPILLARY
Glucose-Capillary: 128 mg/dL — ABNORMAL HIGH (ref 70–99)
Glucose-Capillary: 126 mg/dL — ABNORMAL HIGH (ref 70–99)
Glucose-Capillary: 230 mg/dL — ABNORMAL HIGH (ref 70–99)
Glucose-Capillary: 167 mg/dL — ABNORMAL HIGH (ref 70–99)

## 2021-03-14 MED ORDER — MAGNESIUM CITRATE PO SOLN
1.0000 | Freq: Once | ORAL | Status: AC
  Administered 2021-03-14: 1 via ORAL
  Filled 2021-03-14: qty 296

## 2021-03-14 MED ORDER — POLYETHYLENE GLYCOL 3350 17 G PO PACK
17.0000 g | PACK | Freq: Two times a day (BID) | ORAL | Status: DC
  Administered 2021-03-14 – 2021-03-15 (×2): 17 g via ORAL
  Filled 2021-03-14 (×2): qty 1

## 2021-03-14 NOTE — Progress Notes (Signed)
NUTRITION NOTE  Consult received for education for gastroparesis. Patient has hx of DM. He reports 2 week hx of progressively worsening abdominal bloating, early satiety, N/V, lightheadedness, and fatigue.  Patient reports that he has not had a BM since 3/14. This is unusual for him.  Provided patient with "Gastroparesis Nutrition Therapy" handout from the Academy of [employer] Dietetics and reviewed it in detail with patient. All questions answered.  For several months PTA patient has been making lifestyle changes in an effort to lose weight. He has been avoiding fried and greasy foods, avoiding bread and rice, drinking unsweet tea with splenda or water, and going to the gym 5 days/week. He reports losing ~40 lb in the past 3-4 months which he is very happy about.   He is currently ordered a Soft diet and consumed 100% of all meals yesterday. He recalls eating an omelet for dinner last night and that he felt overtly full and bloated after.   He did not eat breakfast due to imaging tests.   Expect good compliance.   Continue Soft diet; do not feel that bariatric restriction needs to be added at this time as patient will likely self-limit and would prefer not to limit his options or portions any further than necessary.       Jarome Matin, MS, RD, LDN, CNSC Inpatient Clinical Dietitian RD pager # available in [facility]  After hours/weekend pager # available in [facility]

## 2021-03-14 NOTE — Progress Notes (Signed)
PROGRESS NOTE    Gary Grimes  VFI:[id number] DOB: 07-22-1961 DOA: 03/10/2021 PCP: Hayden Rasmussen, MD   Brief Narrative: Gary Grimes is a 60 y.o. male with a history of diabetes mellitus, hypertension, insomnia, CAD. Patient presented secondary to abdominal pain, nausea and vomiting. Workup unremarkable. Patient managed symptomatic with minimal improvement and then started on Reglan IV with more improvement.   Assessment & Plan:   Principal Problem:   Intractable abdominal pain Active Problems:   Essential hypertension   Type II diabetes mellitus with neurological manifestations (HCC)   Mild CAD   OSA on CPAP   Intractable nausea/vomiting Abdominal pain Probable gastroparesis Outpatient CT for similar issues was unremarkable for an acute process. HIDA scan was negative for acute cholecystitis. History of abnormal gastric emptying study suggesting possible gastroparesis. Reglan IV started with improvement initially. Some pain after advancing to a soft diet. CMP/lipase unremarkable for etiology -Continue Reglan IV (discussed risks/benefits of the use of Reglan, including the development of Tardive Dyskinesia). -Continue Phenergan prn -Continue Protonix -Minimize narcotics -RUQ ultrasound; if negative and diet modification does not help, will consult GI -Dietitian consult  Diabetes mellitus, type 2 Patient is on Novolin 70/30 as an outpatient. Last hemoglobin A1C of 7.5%. -Continue Lantus 15 units BID and SSI  Primary hypertension Patient is on olmesartan daily in addition to metoprolol tartrate as needed -Continue irbesartan  CAD -Continue Lipitor and aspirin  OSA -Continue CPAP nightly  Lumbar radiculopathy Patient is on high doses of Lyrica (200 mg TID). -Continue Lyrica  Insomnia Patient is on Belsomra and Ambien -Continue Ambien  Constipation -Miralax BID, Senokot-S BID -Magnesium citrate x1   DVT prophylaxis: Lovenox Code Status:   Code  Status: Full Code Family Communication: None at bedside Disposition Plan: Discharge home likely in 24 hours pending ability to advance diet successfully, continued workup   Consultants:   None  Procedures:   None  Antimicrobials:  None    Subjective: Continues to have epigastric pain. Worse when standing up and moving.  Objective: Vitals:   03/13/21 0700 03/13/21 0732 03/13/21 2203 03/14/21 0611  BP:  131/74 132/84 127/76  Pulse:  (!) 55 (!) 55 (!) 55  Resp:  17 16 14   Temp:  97.8 F (36.6 C) 98.6 F (37 C) (!) 97.4 F (36.3 C)  TempSrc: Axillary Axillary Oral Oral  SpO2:  98% 97% 96%  Weight:      Height:        Intake/Output Summary (Last 24 hours) at 03/14/2021 1050 Last data filed at 03/13/2021 1700 Gross per 24 hour  Intake 480 ml  Output 600 ml  Net -120 ml   Filed Weights   03/10/21 1249 03/11/21 0142  Weight: 127 kg 126.2 kg    Examination:  General exam: Appears calm and comfortable and in no acute distress. Cconversant Respiratory: Clear to auscultation. Respiratory effort normal with no intercostal retractions or use of accessory muscles Cardiovascular: S1 & S2 heard, RRR. No murmurs, rubs, gallops or clicks. No edema Gastrointestinal: Abdomen is nondistended, soft and tender in epigastrium. No masses felt. Normal bowel sounds heard Neurologic: No focal neurological deficits Musculoskeletal: No calf tenderness Skin: No cyanosis. No new rashes Psychiatry: Alert and oriented x3. Memory intact. Mood & affect appropriate      Data Reviewed: I have personally reviewed following labs and imaging studies  CBC Lab Results  Component Value Date   WBC 8.1 03/11/2021   RBC 4.21 (L) 03/11/2021   HGB 13.3  03/11/2021   HCT 38.9 (L) 03/11/2021   MCV 92.4 03/11/2021   MCH 31.6 03/11/2021   PLT 225 03/11/2021   MCHC 34.2 03/11/2021   RDW 15.6 (H) 03/11/2021   LYMPHSABS 3.3 02/28/2021   MONOABS 0.8 02/28/2021   EOSABS 0.2 02/28/2021   BASOSABS 0.0  63/12/6008     Last metabolic panel Lab Results  Component Value Date   NA 141 03/14/2021   K 3.7 03/14/2021   CL 102 03/14/2021   CO2 28 03/14/2021   BUN 11 03/14/2021   CREATININE 1.18 03/14/2021   GLUCOSE 155 (H) 03/14/2021   GFRNONAA >60 03/14/2021   GFRAA 65 10/14/2020   CALCIUM 9.7 03/14/2021   PROT 7.6 03/14/2021   ALBUMIN 4.5 03/14/2021   LABGLOB 2.8 08/06/2020   AGRATIO 1.6 08/06/2020   BILITOT 1.3 (H) 03/14/2021   ALKPHOS 72 03/14/2021   AST 28 03/14/2021   ALT 31 03/14/2021   ANIONGAP 11 03/14/2021    CBG (last 3)  Recent Labs    03/13/21 1618 03/13/21 2200 03/14/21 0736  GLUCAP 111* 201* 128*     GFR: Estimated Creatinine Clearance: 89.1 mL/min (by C-G formula based on SCr of 1.18 mg/dL).  Coagulation Profile: No results for input(s): INR, PROTIME in the last 168 hours.  Recent Results (from the past 240 hour(s))  SARS CORONAVIRUS 2 (TAT 6-24 HRS) Nasopharyngeal Nasopharyngeal Swab     Status: None   Collection Time: 03/10/21 10:58 PM   Specimen: Nasopharyngeal Swab  Result Value Ref Range Status   SARS Coronavirus 2 NEGATIVE NEGATIVE Final    Comment: (NOTE) SARS-CoV-2 target nucleic acids are NOT DETECTED.  The SARS-CoV-2 RNA is generally detectable in upper and lower respiratory specimens during the acute phase of infection. Negative results do not preclude SARS-CoV-2 infection, do not rule out co-infections with other pathogens, and should not be used as the sole basis for treatment or other patient management decisions. Negative results must be combined with clinical observations, patient history, and epidemiological information. The expected result is Negative.  Fact Sheet for Patients: SugarRoll.be  Fact Sheet for Healthcare Providers: https://www.woods-mathews.com/  This test is not yet approved or cleared by the [place] FDA and  has been authorized for detection and/or diagnosis of  SARS-CoV-2 by FDA under an Emergency Use Authorization (EUA). This EUA will remain  in effect (meaning this test can be used) for the duration of the COVID-19 declaration under Se ction 564(b)(1) of the Act, 21 U.S.C. section 360bbb-3(b)(1), unless the authorization is terminated or revoked sooner.  Performed at [facility], [place] [address] [place], [place] [address]         Radiology Studies: DG Abd Portable 1V  Result Date: 03/13/2021 CLINICAL DATA:  Right upper quadrant pain for 2 weeks. EXAM: PORTABLE ABDOMEN - 1 VIEW COMPARISON:  CT 3 days ago 03/10/2021 FINDINGS: Divided supine views of the abdomen. No obvious free intra-abdominal air. No bowel dilatation to suggest obstruction. Moderate volume of stool in the colon. No radiopaque calculi are seen, soft tissue attenuation from habitus limits assessment. Stable osseous structures. IMPRESSION: Moderate volume of stool in the colon. Otherwise unremarkable radiograph of the abdomen. Electronically Signed   By: Keith Rake M.D.   On: 03/13/2021 19:13        Scheduled Meds: . aspirin EC  81 mg Oral Daily  . atorvastatin  10 mg Oral Daily  . enoxaparin (LOVENOX) injection  60 mg Subcutaneous Q24H  . insulin aspart  0-5  Units Subcutaneous QHS  . insulin aspart  0-9 Units Subcutaneous TID WC  . insulin glargine  15 Units Subcutaneous BID  . irbesartan  300 mg Oral Daily  . methocarbamol  750 mg Oral TID  . metoCLOPramide (REGLAN) injection  5 mg Intravenous Q8H  . pantoprazole  40 mg Oral Daily  . polyethylene glycol  17 g Oral BID  . pregabalin  200 mg Oral TID  . senna-docusate  1 tablet Oral BID  . traZODone  200 mg Oral QHS   Continuous Infusions:    LOS: 3 days     Cordelia Poche, MD [facility] 03/14/2021, 10:50 AM  If 7PM-7AM, please contact night-coverage [email or web address]

## 2021-03-14 NOTE — Plan of Care (Signed)
Plan of care reviewed and discussed with the patient. 

## 2021-03-15 DIAGNOSIS — E1143 Type 2 diabetes mellitus with diabetic autonomic (poly)neuropathy: Principal | ICD-10-CM

## 2021-03-15 DIAGNOSIS — K3184 Gastroparesis: Secondary | ICD-10-CM

## 2021-03-15 HISTORY — DX: Gastroparesis: K31.84

## 2021-03-15 HISTORY — DX: Gastroparesis: E11.43

## 2021-03-15 LAB — GLUCOSE, CAPILLARY
Glucose-Capillary: 165 mg/dL — ABNORMAL HIGH (ref 70–99)
Glucose-Capillary: 188 mg/dL — ABNORMAL HIGH (ref 70–99)

## 2021-03-15 MED ORDER — HYDROCODONE-ACETAMINOPHEN 5-325 MG PO TABS: 1.0000 | ORAL_TABLET | ORAL | 0 refills | Status: DC | PRN

## 2021-03-15 MED ORDER — PANTOPRAZOLE SODIUM 40 MG PO TBEC
40.0000 mg | DELAYED_RELEASE_TABLET | Freq: Two times a day (BID) | ORAL | 0 refills | Status: DC
Start: 2021-03-15 — End: 2021-07-11

## 2021-03-15 MED ORDER — SUCRALFATE 1 G PO TABS: 1.0000 g | ORAL_TABLET | Freq: Three times a day (TID) | ORAL | Status: DC

## 2021-03-15 MED ORDER — POLYETHYLENE GLYCOL 3350 17 G PO PACK: 17.0000 g | PACK | Freq: Every day | ORAL | 0 refills | Status: AC

## 2021-03-15 MED ORDER — SUCRALFATE 1 G PO TABS: 1.0000 g | ORAL_TABLET | Freq: Three times a day (TID) | ORAL | 0 refills | Status: DC

## 2021-03-15 MED ORDER — SENNOSIDES-DOCUSATE SODIUM 8.6-50 MG PO TABS
1.0000 | ORAL_TABLET | Freq: Two times a day (BID) | ORAL | 0 refills | Status: AC
Start: 2021-03-15 — End: 2021-03-20

## 2021-03-15 MED ORDER — METOCLOPRAMIDE HCL 10 MG PO TABS: 10.0000 mg | ORAL_TABLET | Freq: Three times a day (TID) | ORAL | 1 refills | Status: DC

## 2021-03-15 MED ORDER — PANTOPRAZOLE SODIUM 40 MG PO TBEC: 40.0000 mg | DELAYED_RELEASE_TABLET | Freq: Two times a day (BID) | ORAL | Status: DC

## 2021-03-15 NOTE — Discharge Summary (Signed)
Physician Discharge Summary  [name] RXV:[id number] DOB: 1961-09-08 DOA: 03/10/2021  PCP: Hayden Rasmussen, MD  Admit date: 03/10/2021 Discharge date: 03/15/2021  Admitted From: Home Disposition: Home  Recommendations for Outpatient Follow-up:  1. Follow up with PCP in 1 week 2. Follow-up with gastroenterology, Dr. Cristina Gong in 4 weeks 3. Please follow up on the following pending results: None  Home Health: None Equipment/Devices: None  Discharge Condition: Stable CODE STATUS: Full code Diet recommendation: Carb modified  Brief/Interim Summary:  Admission HPI written by [name], MD   Chief Complaint: Abdominal pain, N/V/D   HPI: Gary Grimes is a 60 y.o. male with medical history significant for insulin-dependent diabetes mellitus, hypertension, insomnia, and coronary artery disease, now presenting to the emergency department for evaluation of severe abdominal pain.  Patient reports that he developed pain in the right upper quadrant of his abdomen and the right flank almost 2 weeks ago, has had some nausea and vomiting associated with this, and also reports some loose stools.  Pain has been fairly constant but had improved after pain medications in the emergency department 10 days ago before slowly becoming severe again.  He has been eating a bland diet, but this has not helped.  Pain sometimes seems to be worse after eating but not always.  He had never experienced this previously.  Denies any fevers or chills.  Denies any chest pain.  No shortness of breath or cough.   Hospital course:  Gastroparesis Intractable nausea/vomiting Abdominal pain Outpatient CT for similar issues was unremarkable for an acute process. HIDA scan was negative for acute cholecystitis. History of abnormal gastric emptying study suggesting possible gastroparesis. Reglan IV started with improvement initially. Some pain after advancing to a soft diet which improved with adjusting dietary  intake. CMP/lipase unremarkable for etiology.  Right upper quadrant ultrasound was negative for pathology, specifically no evidence of cholelithiasis.  GI was consulted secondary to persistent right upper quadrant pain and recommended addition of Carafate and Protonix twice daily in addition to outpatient follow-up with primary gastroenterologist.  Prior to discharge, patient was requesting general surgery consults.  Discussed with on-call general surgery Dr. Donne Hazel recommended no inpatient cholecystectomy.  Diabetes mellitus, type 2 Patient is on Novolin 70/30 as an outpatient. Last hemoglobin A1C of 7.5%.  Continue on discharge  Primary hypertension Patient is on olmesartan daily in addition to metoprolol tartrate as needed.  Continue on discharge  CAD Continue Lipitor and aspirin  OSA Continue CPAP nightly  Lumbar radiculopathy Patient is on high doses of Lyrica (200 mg TID).  Continue on discharge.  Insomnia Patient is on Belsomra and Ambien.  Continue on discharge.  Constipation Moderate stool noted on abdominal x-ray.  Patient had successful bowel movement with bowel regimen.  Discontinued with MiraLAX and Senokot-S  Discharge Diagnoses:  Principal Problem:   Intractable abdominal pain Active Problems:   Essential hypertension   Type II diabetes mellitus with neurological manifestations (HCC)   Mild CAD   OSA on CPAP    Discharge Instructions  Discharge Instructions    Call MD for:  severe uncontrolled pain   Complete by: As directed    Increase activity slowly   Complete by: As directed      Allergies as of 03/15/2021      Reactions   Morphine Other (See Comments)   PT BECAME DELIRIOUS  PT BECAME DELIRIOUS  PT BECAME DELIRIOUS       Medication List    STOP taking  these medications   ibuprofen 800 MG tablet Commonly known as: ADVIL   meloxicam 7.5 MG tablet Commonly known as: Mobic   omeprazole 40 MG capsule Commonly known as: PRILOSEC      TAKE these medications   aspirin EC 81 MG tablet Take 1 tablet (81 mg total) by mouth daily. Swallow whole.   atorvastatin 10 MG tablet Commonly known as: LIPITOR Take 10 mg by mouth daily.   Belsomra 20 MG Tabs Generic drug: Suvorexant TAKE 20 MG BY MOUTH DAILY.   HYDROcodone-acetaminophen 5-325 MG tablet Commonly known as: NORCO/VICODIN Take 1-2 tablets by mouth every 4 (four) hours as needed for moderate pain.   hydrOXYzine 25 MG tablet Commonly known as: ATARAX/VISTARIL Take 25 mg by mouth at bedtime.   methocarbamol 750 MG tablet Commonly known as: ROBAXIN Take 750 mg by mouth daily as needed for muscle spasms.   metoCLOPramide 10 MG tablet Commonly known as: REGLAN Take 1 tablet (10 mg total) by mouth 3 (three) times daily before meals.   metoprolol tartrate 100 MG tablet Commonly known as: LOPRESSOR Take 100 mg by mouth daily as needed. For high Blood pressure reading   nitroGLYCERIN 0.4 MG SL tablet Commonly known as: NITROSTAT Place 0.4 mg under the tongue daily. Every 5 minutes for chest pain   NovoLIN 70/30 (70-30) 100 UNIT/ML injection Generic drug: insulin NPH-regular Human Inject 36 Units into the skin 2 (two) times daily with a meal. Per sliding scale ...   olmesartan 40 MG tablet Commonly known as: BENICAR Take 40 mg by mouth daily.   pantoprazole 40 MG tablet Commonly known as: PROTONIX Take 1 tablet (40 mg total) by mouth 2 (two) times daily.   polyethylene glycol 17 g packet Commonly known as: MIRALAX / GLYCOLAX Take 17 g by mouth daily.   pregabalin 200 MG capsule Commonly known as: LYRICA Take 200 mg by mouth 3 (three) times daily. What changed: Another medication with the same name was removed. Continue taking this medication, and follow the directions you see here.   senna-docusate 8.6-50 MG tablet Commonly known as: Senokot-S Take 1 tablet by mouth 2 (two) times daily for 5 days.   sucralfate 1 g tablet Commonly known as:  CARAFATE Take 1 tablet (1 g total) by mouth 4 (four) times daily -  with meals and at bedtime.   traZODone 100 MG tablet Commonly known as: DESYREL Take 200 mg by mouth at bedtime as needed for sleep.   zolpidem 12.5 MG CR tablet Commonly known as: AMBIEN CR Take 12.5 mg by mouth at bedtime as needed for sleep.       Follow-up Information    Hayden Rasmussen, MD. Schedule an appointment as soon as possible for a visit in 1 week(s).   Specialty: Family Medicine Why: Hospital follow-up Contact information: [place] [address] [phone]        Ronald Lobo, MD. Schedule an appointment as soon as possible for a visit in 4 week(s).   Specialty: Gastroenterology Why: Hospital follow-up.  Gastroparesis. Contact information: [address] [place] [address] [phone]              Allergies  Allergen Reactions  . Morphine Other (See Comments)    PT BECAME DELIRIOUS  PT BECAME DELIRIOUS  PT BECAME DELIRIOUS     Consultations:  Gastroenterology   Procedures/Studies: CT ABDOMEN PELVIS W CONTRAST  Result Date: 03/10/2021 CLINICAL DATA:  Right-sided pain under  right ribcage for 1.5 weeks, pain radiates to right lower back and right lower abdomen, nausea/vomiting/diarrhea EXAM: CT ABDOMEN AND PELVIS WITH CONTRAST TECHNIQUE: Multidetector CT imaging of the abdomen and pelvis was performed using the standard protocol following bolus administration of intravenous contrast. CONTRAST:  158mL OMNIPAQUE IOHEXOL 300 MG/ML  SOLN COMPARISON:  02/28/2021, 05/10/2019 FINDINGS: Lower chest: No acute pleural or parenchymal lung disease. Hepatobiliary: No focal liver abnormality is seen. No gallstones, gallbladder wall thickening, or biliary dilatation. Pancreas: Unremarkable. No pancreatic ductal dilatation or surrounding inflammatory changes. Spleen: Normal in size without focal abnormality. Adrenals/Urinary Tract: Stable 18 x 19 mm  left adrenal nodule, likely adenoma given long-term stability. Right adrenals unremarkable. Stable bilateral renal cortical cysts. No urinary tract calculi or obstructive uropathy. Bladder is moderately distended with no gross abnormality. Stomach/Bowel: No bowel obstruction or ileus. Diverticulosis of the descending and sigmoid colon without diverticulitis. The appendix is surgically absent. No bowel wall thickening or inflammatory change. Vascular/Lymphatic: Aortic atherosclerosis. No enlarged abdominal or pelvic lymph nodes. Reproductive: Prostate is unremarkable. Other: No free fluid or free gas. No abdominal wall hernia. Stable postsurgical changes from prior ventral hernia repair. Musculoskeletal: There are no acute displaced fractures. Reconstructed images demonstrate no additional findings. IMPRESSION: 1. No acute intra-abdominal or intrapelvic process. 2. Diverticulosis without diverticulitis. 3. Stable left adrenal nodule, most consistent with adenoma given long-term stability since 2020. 4.  Aortic Atherosclerosis (ICD10-I70.0). Electronically Signed   By: Randa Ngo M.D.   On: 03/10/2021 19:30   CT ABDOMEN PELVIS W CONTRAST  Result Date: 02/28/2021 CLINICAL DATA:  Abdominal pain.  Nausea. EXAM: CT ABDOMEN AND PELVIS WITH CONTRAST TECHNIQUE: Multidetector CT imaging of the abdomen and pelvis was performed using the standard protocol following bolus administration of intravenous contrast. CONTRAST:  119mL OMNIPAQUE IOHEXOL 300 MG/ML  SOLN COMPARISON:  05/10/2019. FINDINGS: Lower chest: Lung bases are clear. Coronary artery calcification. Heart size normal. No pericardial or pleural effusion. Hepatobiliary: Liver and gallbladder are unremarkable. No biliary ductal dilatation. Pancreas: Negative. Spleen: Negative. Adrenals/Urinary Tract: Right adrenal gland is unremarkable. 1.8 cm hyperdense left adrenal nodule is unchanged in size from 05/10/2019. Low-attenuation lesions in the kidneys measure up to  3.2 cm on the left and are indicative of cysts. Kidneys are otherwise unremarkable. Ureters are decompressed. Bladder is grossly unremarkable. Stomach/Bowel: Stomach, small bowel and colon are unremarkable. Appendix is not readily visualized. Vascular/Lymphatic: Atherosclerotic calcification of the aorta. No pathologically enlarged lymph nodes. Reproductive: Prostate is visualized. Other: Ventral hernia repair. Small supraumbilical midline ventral hernia contains fat. No free fluid. Mesenteries and peritoneum are unremarkable. Musculoskeletal: Mild degenerative changes in the spine. No worrisome lytic or sclerotic lesions. IMPRESSION: 1. No acute findings to explain the patient's symptoms. 2. Left adrenal nodule is stable from 05/10/2019, indicative of an adenoma. 3. Aortic atherosclerosis (ICD10-I70.0). Coronary artery calcification. Electronically Signed   By: Lorin Picket M.D.   On: 02/28/2021 14:58   NM Hepato W/EF  Result Date: 03/11/2021 CLINICAL DATA:  Chronic upper abdominal pain EXAM: NUCLEAR MEDICINE HEPATOBILIARY IMAGING WITH GALLBLADDER EF TECHNIQUE: Sequential images of the abdomen were obtained out to 60 minutes following intravenous administration of radiopharmaceutical. After oral ingestion of Ensure, gallbladder ejection fraction was determined. At 60 min, normal ejection fraction is greater than 33%. RADIOPHARMACEUTICALS:  5.3 mCi Tc-59m  Choletec IV COMPARISON:  CT abdomen 03/10/2021 FINDINGS: Satisfactory uptake of radiopharmaceutical from the blood pool. Biliary activity is visible at 9 minutes. Gallbladder activity is visible at 11 minutes. Bowel activity is visible at  60 minutes. The patient did experience abdominal pain after drinking Ensure. Calculated gallbladder ejection fraction is 59%. (Normal gallbladder ejection fraction with Ensure is greater than 33%.) IMPRESSION: 1. Overall normal hepatobiliary nuclear medicine scan. Gallbladder ejection fraction is 59% over 1 hour, within  normal limits. Electronically Signed   By: Van Clines M.D.   On: 03/11/2021 14:50   DG Abd Portable 1V  Result Date: 03/13/2021 CLINICAL DATA:  Right upper quadrant pain for 2 weeks. EXAM: PORTABLE ABDOMEN - 1 VIEW COMPARISON:  CT 3 days ago 03/10/2021 FINDINGS: Divided supine views of the abdomen. No obvious free intra-abdominal air. No bowel dilatation to suggest obstruction. Moderate volume of stool in the colon. No radiopaque calculi are seen, soft tissue attenuation from habitus limits assessment. Stable osseous structures. IMPRESSION: Moderate volume of stool in the colon. Otherwise unremarkable radiograph of the abdomen. Electronically Signed   By: Keith Rake M.D.   On: 03/13/2021 19:13   US Abdomen Limited RUQ (LIVER/GB)  Result Date: 03/14/2021 CLINICAL DATA:  Epigastric pain. EXAM: ULTRASOUND ABDOMEN LIMITED RIGHT UPPER QUADRANT COMPARISON:  Ultrasound 02/28/2021.  CT 03/10/2021 FINDINGS: Gallbladder: Physiologically distended. No gallstones. Normal gallbladder wall thickness. No pericholecystic fluid. No sonographic Murphy sign noted by sonographer. Common bile duct: Diameter: 4 mm, normal. Liver: No focal lesion identified. Heterogeneous and mildly increased in parenchymal echogenicity. Portal vein is patent on color Doppler imaging with normal direction of blood flow towards the liver. Other: No right upper quadrant ascites. IMPRESSION: 1. Normal sonographic appearance of the gallbladder. No biliary dilatation. 2. Mild increased hepatic parenchymal echogenicity suggesting steatosis. Electronically Signed   By: Keith Rake M.D.   On: 03/14/2021 16:25   US Abdomen Limited RUQ (LIVER/GB)  Result Date: 02/28/2021 CLINICAL DATA:  Right upper quadrant abdominal pain EXAM: ULTRASOUND ABDOMEN LIMITED RIGHT UPPER QUADRANT COMPARISON:  None. FINDINGS: Gallbladder: No gallstones or wall thickening visualized. No sonographic Murphy sign noted by sonographer. Common bile duct: Diameter:  3 mm, normal Liver: No focal lesion identified. Increased parenchymal echogenicity. Portal vein is patent on color Doppler imaging with normal direction of blood flow towards the liver. Other: None. IMPRESSION: Increased liver echogenicity likely reflecting steatosis. Gallbladder is unremarkable. Electronically Signed   By: Macy Mis M.D.   On: 02/28/2021 12:55      Subjective: Some right upper quadrant abdominal pain.  No pain with eating.  No nausea or vomiting.  Large bowel movement yesterday.  Discharge Exam: Vitals:   03/14/21 2119 03/15/21 0539  BP: 122/78 122/79  Pulse: (!) 56 (!) 52  Resp: 16 14  Temp: 98.9 F (37.2 C) 97.9 F (36.6 C)  SpO2: 98% 98%   Vitals:   03/14/21 0611 03/14/21 1315 03/14/21 2119 03/15/21 0539  BP: 127/76 140/69 122/78 122/79  Pulse: (!) 55 (!) 55 (!) 56 (!) 52  Resp: 14 16 16 14   Temp: (!) 97.4 F (36.3 C) 98.9 F (37.2 C) 98.9 F (37.2 C) 97.9 F (36.6 C)  TempSrc: Oral  Oral Oral  SpO2: 96% 98% 98% 98%  Weight:      Height:        General: Pt is alert, awake, not in acute distress Cardiovascular: RRR, S1/S2 +, no rubs, no gallops Respiratory: CTA bilaterally, no wheezing, no rhonchi Abdominal: Soft, mild RUQ tenderness, ND, bowel sounds + Extremities: no edema, no cyanosis    The results of significant diagnostics from this hospitalization (including imaging, microbiology, ancillary and laboratory) are listed below for reference.  Microbiology: Recent Results (from the past 240 hour(s))  SARS CORONAVIRUS 2 (TAT 6-24 HRS) Nasopharyngeal Nasopharyngeal Swab     Status: None   Collection Time: 03/10/21 10:58 PM   Specimen: Nasopharyngeal Swab  Result Value Ref Range Status   SARS Coronavirus 2 NEGATIVE NEGATIVE Final    Comment: (NOTE) SARS-CoV-2 target nucleic acids are NOT DETECTED.  The SARS-CoV-2 RNA is generally detectable in upper and lower respiratory specimens during the acute phase of infection.  Negative results do not preclude SARS-CoV-2 infection, do not rule out co-infections with other pathogens, and should not be used as the sole basis for treatment or other patient management decisions. Negative results must be combined with clinical observations, patient history, and epidemiological information. The expected result is Negative.  Fact Sheet for Patients: SugarRoll.be  Fact Sheet for Healthcare Providers: https://www.woods-mathews.com/  This test is not yet approved or cleared by the [place] FDA and  has been authorized for detection and/or diagnosis of SARS-CoV-2 by FDA under an Emergency Use Authorization (EUA). This EUA will remain  in effect (meaning this test can be used) for the duration of the COVID-19 declaration under Se ction 564(b)(1) of the Act, 21 U.S.C. section 360bbb-3(b)(1), unless the authorization is terminated or revoked sooner.  Performed at [facility], [place] [address] [place], [place] [address]      Labs: BNP (last 3 results) No results for input(s): BNP in the last 8760 hours. Basic Metabolic Panel: Recent Labs  Lab 03/10/21 1549 03/11/21 0227 03/14/21 0833  NA 139 139 141  K 4.4 4.1 3.7  CL 107 107 102  CO2 22 23 28   GLUCOSE 159* 151* 155*  BUN 23 18 11   CREATININE 1.14 1.11 1.18  CALCIUM 9.7 9.1 9.7   Liver Function Tests: Recent Labs  Lab 03/10/21 1549 03/11/21 0227 03/14/21 0833  AST 24 21 28   ALT 29 27 31   ALKPHOS 70 62 72  BILITOT 1.0 1.2 1.3*  PROT 7.2 6.7 7.6  ALBUMIN 4.4 4.0 4.5   Recent Labs  Lab 03/10/21 1549 03/14/21 0833  LIPASE 47 45   No results for input(s): AMMONIA in the last 168 hours. CBC: Recent Labs  Lab 03/10/21 1549 03/11/21 0227  WBC 9.7 8.1  HGB 13.8 13.3  HCT 40.6 38.9*  MCV 92.5 92.4  PLT 226 225   Cardiac Enzymes: No results for input(s): CKTOTAL, CKMB, CKMBINDEX, TROPONINI in the last 168 hours. BNP: Invalid input(s):  POCBNP CBG: Recent Labs  Lab 03/14/21 1159 03/14/21 1735 03/14/21 2115 03/15/21 0750 03/15/21 1126  GLUCAP 126* 230* 167* 165* 188*   D-Dimer No results for input(s): DDIMER in the last 72 hours. Hgb A1c No results for input(s): HGBA1C in the last 72 hours. Lipid Profile No results for input(s): CHOL, HDL, LDLCALC, TRIG, CHOLHDL, LDLDIRECT in the last 72 hours. Thyroid function studies No results for input(s): TSH, T4TOTAL, T3FREE, THYROIDAB in the last 72 hours.  Invalid input(s): FREET3 Anemia work up No results for input(s): VITAMINB12, FOLATE, FERRITIN, TIBC, IRON, RETICCTPCT in the last 72 hours. Urinalysis    Component Value Date/Time   COLORURINE YELLOW 03/10/2021 1724   APPEARANCEUR CLEAR 03/10/2021 1724   LABSPEC 1.014 03/10/2021 1724   PHURINE 5.0 03/10/2021 [place] 03/10/2021 1724   [place] 03/10/2021 [place] 03/10/2021 [place] 03/10/2021 1724   PROTEINUR NEGATIVE 03/10/2021 1724   UROBILINOGEN 0.2 04/13/2012 0140   NITRITE NEGATIVE 03/10/2021 1724  LEUKOCYTESUR NEGATIVE 03/10/2021 1724   Sepsis Labs Invalid input(s): PROCALCITONIN,  WBC,  LACTICIDVEN Microbiology Recent Results (from the past 240 hour(s))  SARS CORONAVIRUS 2 (TAT 6-24 HRS) Nasopharyngeal Nasopharyngeal Swab     Status: None   Collection Time: 03/10/21 10:58 PM   Specimen: Nasopharyngeal Swab  Result Value Ref Range Status   SARS Coronavirus 2 NEGATIVE NEGATIVE Final    Comment: (NOTE) SARS-CoV-2 target nucleic acids are NOT DETECTED.  The SARS-CoV-2 RNA is generally detectable in upper and lower respiratory specimens during the acute phase of infection. Negative results do not preclude SARS-CoV-2 infection, do not rule out co-infections with other pathogens, and should not be used as the sole basis for treatment or other patient management decisions. Negative results must be combined with clinical observations, patient  history, and epidemiological information. The expected result is Negative.  Fact Sheet for Patients: SugarRoll.be  Fact Sheet for Healthcare Providers: https://www.woods-mathews.com/  This test is not yet approved or cleared by the [place] FDA and  has been authorized for detection and/or diagnosis of SARS-CoV-2 by FDA under an Emergency Use Authorization (EUA). This EUA will remain  in effect (meaning this test can be used) for the duration of the COVID-19 declaration under Se ction 564(b)(1) of the Act, 21 U.S.C. section 360bbb-3(b)(1), unless the authorization is terminated or revoked sooner.  Performed at [facility], [place] [address] [place], [place] [address]      Time coordinating discharge: 35 minutes  SIGNED:   Cordelia Poche, MD [facility] 03/15/2021, 11:53 AM

## 2021-03-15 NOTE — Consult Note (Signed)
Referring Provider:  [facility] Primary Care Physician:  Hayden Rasmussen, MD Primary Gastroenterologist: Dr. Cristina Gong    Reason for Consultation: Right upper quadrant abdominal pain  HPI: Gary Grimes is a 60 y.o. male with past medical history of reported gastroparesis, atrial fibrillation status post ablation, sleep apnea, GERD and diabetes history of chronic nausea and chronic abdominal pain admitted to the hospital with right upper quadrant abdominal pain.  Patient currently admitted to the hospital since March 14.  Normal CBC, CMP and lipase on admission.  GI is consulted for further evaluation.  According to  the patient, he was doing fine until 2 weeks ago when he started noticing right upper quadrant abdominal pain.  Right upper quadrant abdominal pain is sharp in nature with occasional radiation towards epigastric area.  Pain is worse with ambulation and better with laying down.  Pain is also better with heating pad.  Had some nausea and vomiting recently.  Denies seeing any fresh blood in the stool.  Occasional dark stools.  Complaining of constipation which gets better with laxative.  CT abdomen pelvis with contrast on March 10, 2021 showed no acute changes and diverticulosis. Normal HIDA scan with EF on March 11, 2021  Ultrasound right upper quadrant yesterday showed fatty liver otherwise no acute changes.  No biliary dilation.  He also had CT scan abdomen pelvis and ultrasound on February 28, 2021 which showed no acute changes.     Previous GI work-up Patient with history of chronic nausea and  abdominal pain. His H. pylori serology was positive in May 2020.  EGD in May 2020 showed gastritis and retained food in the stomach, biopsies were negative for H. Pylori.  Colonoscopy in August 2020 for evaluation of abdominal pain and diarrhea showed 2 small polyps,diverticulosis and  hemorrhoids.  Pathology showed 1 tubular adenoma, one hyperplastic polyp, random biopsies from terminal ileum and  colon was negative.   Patient was referred to Dr. Derrill Kay in August 2020 for possible gastroparesis.  They had requested outpatient work-up with GI pathogen panel with C. difficile, fecal calprotectin, pancreatic elastase and H. pylori stool antigen lung with repeating gastric emptying scan.  Past Medical History:  Diagnosis Date  . Achilles tendon contracture, left   . Acquired equinus deformity of both feet 10/22/2019  . ADHD   . Anginal pain ([place])    ER visit 01/14/2014 visit on chart   . Anxiety    pt denies  . Arthritis   . Bipolar 1 disorder ([place]) 01/15/2020  . Bipolar disorder ([place])   . Bipolar disorder, manic ([place]) 05/29/2016  . Chronic a-fib ([place]) 06/06/2018  . Chronic insomnia 08/01/2020  . Chronic lower back pain   . Chronic pansinusitis 04/28/2019  . Constipation   . Controlled type 2 diabetes mellitus, with long-term current use of insulin ([place]) 06/06/2018  . 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/19/2020  . Depression    did get seen in er 4/13 for evaluation-psyc situational none recent  . DOE (dyspnea on exertion) 11/29/2019  . Essential hypertension 06/06/2018  . GERD (gastroesophageal reflux disease)   . Headache    none recent  . High cholesterol   . High triglycerides   . HLD (hyperlipidemia) 01/15/2020  . Hypertension   . Increased body mass index 09/19/2020  . Joint pain   . Levator syndrome 2001   history   . Lumbar pain 09/19/2008   Qualifier: Diagnosis of  By: Aline Brochure MD, Dorothyann Peng    . Lumbar radiculopathy 09/19/2020  .  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 Mild CAD 05/17/2019  . Neuropathy   . OSA (obstructive sleep apnea) 01/15/2020  . OSA on CPAP   . Overweight 06/06/2018  . Pain in thoracic spine 09/19/2008   Qualifier: Diagnosis of  By: Aline Brochure MD, Dorothyann Peng    . Panic attacks   . Perirectal abscess   . Plantar fasciitis of left foot 02/28/2019  . Prediabetes   . Preoperative cardiovascular examination  05/17/2019  . S/P ablation of atrial fibrillation   . S/P nasal septoplasty 05/31/2019  . Sleep apnea 06/06/2018  . SPONDYLOSIS 09/19/2008   Qualifier: Diagnosis of  By: Aline Brochure MD, Dorothyann Peng    . Type 1 diabetes mellitus ([place]) 09/19/2020  . Type II diabetes mellitus ([place])   . Type II diabetes mellitus with neurological manifestations ([place]) 02/28/2019  . Ventral incisional hernia 01/29/2015  . Vitamin D deficiency   . Work related injury 09/19/2020    Past Surgical History:  Procedure Laterality Date  . ANAL FISSURE REPAIR  08/05/2000   proctoscopy  . APPENDECTOMY  1984  . ATRIAL FIBRILLATION ABLATION  10/28/2018  . ATRIAL FIBRILLATION ABLATION N/A 10/28/2018   Procedure: ATRIAL FIBRILLATION ABLATION;  Surgeon: Constance Haw, MD;  Location: [place] [facility];  Service: Cardiovascular;  Laterality: N/A;  . BIOPSY  05/24/2019   Procedure: BIOPSY;  Surgeon: Ronald Lobo, MD;  Location: [facility];  Service: Endoscopy;;  . BIOPSY  08/10/2019   Procedure: BIOPSY;  Surgeon: Ronald Lobo, MD;  Location: [facility];  Service: Endoscopy;;  . COLONOSCOPY  2011  . COLONOSCOPY WITH PROPOFOL N/A 08/10/2019   Procedure: COLONOSCOPY WITH PROPOFOL;  Surgeon: Ronald Lobo, MD;  Location: [facility];  Service: Endoscopy;  Laterality: N/A;  . ESOPHAGOGASTRODUODENOSCOPY (EGD) WITH PROPOFOL N/A 05/24/2019   Procedure: ESOPHAGOGASTRODUODENOSCOPY (EGD) WITH PROPOFOL;  Surgeon: Ronald Lobo, MD;  Location: [facility];  Service: Endoscopy;  Laterality: N/A;  . GASTROCNEMIUS RECESSION Left 11/03/2019   Procedure: LEFT GASTROCNEMIUS RECESSION;  Surgeon: Newt Minion, MD;  Location: [facility];  Service: Orthopedics;  Laterality: Left;  . HERNIA REPAIR    . INSERTION OF MESH N/A 01/29/2015   Procedure: INSERTION OF MESH;  Surgeon: Excell Seltzer, MD;  Location: [facility];  Service: General;  Laterality: N/A;  . IRRIGATION AND DEBRIDEMENT ABSCESS  02/18/2012   peri-rectal  . LEFT HEART CATH AND CORONARY  ANGIOGRAPHY N/A 06/08/2018   Procedure: LEFT HEART CATH AND CORONARY ANGIOGRAPHY;  Surgeon: Leonie Man, MD;  Location: [facility] [facility];  Service: Cardiovascular;  Laterality: N/A;  . LEFT HEART CATH AND CORONARY ANGIOGRAPHY N/A 10/18/2020   Procedure: LEFT HEART CATH AND CORONARY ANGIOGRAPHY;  Surgeon: Martinique, Peter M, MD;  Location: [place] [facility];  Service: Cardiovascular;  Laterality: N/A;  . NASAL SEPTOPLASTY W/ TURBINOPLASTY  05/31/2019  . NASAL SEPTOPLASTY W/ TURBINOPLASTY Bilateral 05/31/2019   Procedure: NASAL SEPTOPLASTY WITH BILATERAL TURBINATE REDUCTION;  Surgeon: Leta Baptist, MD;  Location: [facility];  Service: ENT;  Laterality: Bilateral;  . PLANTAR FASCIA RELEASE Left 11/03/2019   Procedure: PLANTAR FASCIA RELEASE LEFT FOOT;  Surgeon: Newt Minion, MD;  Location: [facility];  Service: Orthopedics;  Laterality: Left;  . POLYPECTOMY  08/10/2019   Procedure: POLYPECTOMY;  Surgeon: Ronald Lobo, MD;  [facility];  Service: Endoscopy;;  . SHOULDER ARTHROSCOPY Left ?2009   "repaired  AC joint; reattached bicept tendon"  . SHOULDER ARTHROSCOPY W/ LABRAL REPAIR Left 08/08/2007  . UMBILICAL HERNIA  REPAIR  10/27/2010  . VENTRAL HERNIA REPAIR N/A 01/29/2015   Procedure: LAPAROSCOPIC VENTRAL HERNIA;  Surgeon: Excell Seltzer, MD;  Location: [facility];  Service: General;  Laterality: N/A;    Prior to Admission medications   Medication Sig Start Date End Date Taking? Authorizing Provider  atorvastatin (LIPITOR) 10 MG tablet Take 10 mg by mouth daily.   Yes [provider]  BELSOMRA 20 MG TABS TAKE 20 MG BY MOUTH DAILY. 01/28/21  Yes Olalere, Adewale A, MD  hydrOXYzine (ATARAX/VISTARIL) 25 MG tablet Take 25 mg by mouth at bedtime.   Yes [provider]  ibuprofen (ADVIL) 800 MG tablet every 6 (six) hours as needed for headache. 10/24/20  Yes [provider]  methocarbamol (ROBAXIN) 750 MG tablet Take 750 mg by mouth daily as needed for muscle spasms.   Yes  [provider]  metoprolol tartrate (LOPRESSOR) 100 MG tablet Take 100 mg by mouth daily as needed. For high Blood pressure reading 10/24/20  Yes [provider]  nitroGLYCERIN (NITROSTAT) 0.4 MG SL tablet Place 0.4 mg under the tongue daily. Every 5 minutes for chest pain   Yes [provider]  NOVOLIN 70/30 (70-30) 100 UNIT/ML injection Inject 36 Units into the skin 2 (two) times daily with a meal. Per sliding scale ... 10/02/19  Yes [provider]  olmesartan (BENICAR) 40 MG tablet Take 40 mg by mouth daily.   Yes [provider]  omeprazole (PRILOSEC) 40 MG capsule Take 40 mg by mouth daily. 01/14/21  Yes [provider]  pregabalin (LYRICA) 150 MG capsule Take 1 capsule (150 mg total) by mouth 2 (two) times daily. 04/11/20  Yes Trula Slade, DPM  pregabalin (LYRICA) 200 MG capsule Take 200 mg by mouth 3 (three) times daily. 01/23/21  Yes [provider]  traZODone (DESYREL) 100 MG tablet Take 200 mg by mouth at bedtime as needed for sleep.   Yes [provider]  zolpidem (AMBIEN CR) 12.5 MG CR tablet Take 12.5 mg by mouth at bedtime as needed for sleep. 10/24/20  Yes [provider]  aspirin EC 81 MG tablet Take 1 tablet (81 mg total) by mouth daily. Swallow whole. Patient not taking: No sig reported 10/02/20   Revankar, Reita Cliche, MD  meloxicam (MOBIC) 7.5 MG tablet Take 1 tablet (7.5 mg total) by mouth daily. Patient not taking: Reported on 03/11/2021 06/28/20   Carole Civil, MD    Scheduled Meds: . aspirin EC  81 mg Oral Daily  . atorvastatin  10 mg Oral Daily  . enoxaparin (LOVENOX) injection  60 mg Subcutaneous Q24H  . insulin aspart  0-5 Units Subcutaneous QHS  . insulin aspart  0-9 Units Subcutaneous TID WC  . insulin glargine  15 Units Subcutaneous BID  . irbesartan  300 mg Oral Daily  . methocarbamol  750 mg Oral TID  . metoCLOPramide (REGLAN) injection  5 mg Intravenous Q8H  . pantoprazole   40 mg Oral Daily  . polyethylene glycol  17 g Oral BID  . pregabalin  200 mg Oral TID  . senna-docusate  1 tablet Oral BID  . traZODone  200 mg Oral QHS   Continuous Infusions: PRN Meds:.acetaminophen **OR** acetaminophen, HYDROcodone-acetaminophen, ondansetron (ZOFRAN) IV, promethazine, zolpidem  Allergies as of 03/10/2021 - Review Complete 03/10/2021  Allergen Reaction Noted  . Morphine Other (See Comments) 05/28/2016    Family History  Problem Relation Age of Onset  . Breast cancer Mother   . Ovarian cancer  Mother   . Diabetes Mother   . Hypertension Mother   . Hyperlipidemia Mother   . Heart disease Mother   . Sleep apnea Mother   . Obesity Mother   . Diabetes Father   . Hypertension Father   . Hyperlipidemia Father   . Heart disease Father   . Depression Father   . Anxiety disorder Father   . Bipolar disorder Father   . Sleep apnea Father   . Obesity Father     Social History   Socioeconomic History  . Marital status: Widowed    Spouse name: Not on file  . Number of children: Not on file  . Years of education: Not on file  . Highest education level: Not on file  Occupational History  . 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 Smoking status: Former Smoker    Packs/day: 0.50    Years: 1.00    Pack years: 0.50    Types: Cigarettes  . Smokeless tobacco: Never Used  . Tobacco comment: quit 1983  Vaping Use  . Vaping Use: Never used  Substance and Sexual Activity  . Alcohol use: Not Currently    Comment: rare wine  . Drug use: Not Currently    Comment: not since 70'S  . Sexual activity: Not on file  Other Topics Concern  . Not on file  Social History Narrative  . Not on file   Social Determinants of Health   Financial Resource Strain: Not on file  Food Insecurity: Not on file  Transportation Needs: Not on file  Physical Activity: Not on file  Stress: Not on file  Social Connections: Not on file  Intimate Partner Violence: Not on file     Review of Systems: 12 point review of system is done which is negative except as mentioned in HPI  Physical Exam: Vital signs: Vitals:   03/14/21 2119 03/15/21 0539  BP: 122/78 122/79  Pulse: (!) 56 (!) 52  Resp: 16 14  Temp: 98.9 F (37.2 C) 97.9 F (36.6 C)  SpO2: 98% 98%   Last BM Date: 03/14/21 Physical Exam Constitutional:      General: He is not in acute distress.    Appearance: He is well-developed.  HENT:     Head: Normocephalic and atraumatic.  Eyes:     Extraocular Movements: Extraocular movements intact.  Cardiovascular:     Rate and Rhythm: Normal rate and regular rhythm.     Heart sounds: Normal heart sounds. No murmur heard.   Pulmonary:     Effort: Pulmonary effort is normal. No respiratory distress.     Breath sounds: Normal breath sounds.  Abdominal:     General: Bowel sounds are normal. There is no distension.     Palpations: Abdomen is soft.     Tenderness: There is abdominal tenderness in the right upper quadrant. There is no guarding or rebound.  Skin:    General: Skin is warm.     Coloration: Skin is not jaundiced.  Neurological:     Mental Status: He is alert and oriented to person, place, and time.  Psychiatric:        Mood and Affect: Mood normal.        Behavior: Behavior normal.     GI:  Lab Results: No results for input(s): WBC, HGB, HCT, PLT in the last 72 hours. BMET Recent Labs    03/14/21 0833  NA 141  K 3.7  CL 102  CO2 28  GLUCOSE 155*  BUN 11  CREATININE 1.18  CALCIUM 9.7   LFT Recent Labs    03/14/21 0833  PROT 7.6  ALBUMIN 4.5  AST 28  ALT 31  ALKPHOS 72  BILITOT 1.3*   PT/INR No results for input(s): LABPROT, INR in the last 72 hours.   Studies/Results: DG Abd Portable 1V  Result Date: 03/13/2021 CLINICAL DATA:  Right upper quadrant pain for 2 weeks. EXAM: PORTABLE ABDOMEN - 1 VIEW COMPARISON:  CT 3 days ago 03/10/2021 FINDINGS: Divided supine views of the abdomen. No obvious free  intra-abdominal air. No bowel dilatation to suggest obstruction. Moderate volume of stool in the colon. No radiopaque calculi are seen, soft tissue attenuation from habitus limits assessment. Stable osseous structures. IMPRESSION: Moderate volume of stool in the colon. Otherwise unremarkable radiograph of the abdomen. Electronically Signed   By: Keith Rake M.D.   On: 03/13/2021 19:13   US Abdomen Limited RUQ (LIVER/GB)  Result Date: 03/14/2021 CLINICAL DATA:  Epigastric pain. EXAM: ULTRASOUND ABDOMEN LIMITED RIGHT UPPER QUADRANT COMPARISON:  Ultrasound 02/28/2021.  CT 03/10/2021 FINDINGS: Gallbladder: Physiologically distended. No gallstones. Normal gallbladder wall thickness. No pericholecystic fluid. No sonographic Murphy sign noted by sonographer. Common bile duct: Diameter: 4 mm, normal. Liver: No focal lesion identified. Heterogeneous and mildly increased in parenchymal echogenicity. Portal vein is patent on color Doppler imaging with normal direction of blood flow towards the liver. Other: No right upper quadrant ascites. IMPRESSION: 1. Normal sonographic appearance of the gallbladder. No biliary dilatation. 2. Mild increased hepatic parenchymal echogenicity suggesting steatosis. Electronically Signed   By: Keith Rake M.D.   On: 03/14/2021 16:25    Impression/Plan: -Right upper quadrant abdominal pain of 2 weeks duration.  Negative CT, [name] and HIDA scan.  Normal blood work.  Pain is worse with ambulation and better with heating pad.  Could be musculoskeletal in nature.  -Intermittent nausea and vomiting.  Was diagnosed with gastroparesis in the past. -History of positive H. pylori serology.  Follow-up EGD showed gastritis and biopsies were negative in 2020.  Recommendations ------------------------- -Extensive negative work-up in the recent past. -Recommend increasing Protonix to twice a day.  Add Carafate  -Recommend outpatient surgical consult for evaluation for chronic  cholecystitis -Recommend symptomatic treatment for now -No further inpatient GI work-up planned.  GI will sign off.  Follow-up with Dr. Cristina Gong in 4 weeks after discharge.   LOS: 4 days   Otis Brace  MD, FACP 03/15/2021, 10:30 AM  Contact #  [phone]

## 2021-03-15 NOTE — Plan of Care (Signed)
Patient dc'd all care plans met  

## 2021-03-15 NOTE — Plan of Care (Signed)
  Problem: Pain Managment: Goal: General experience of comfort will improve 03/15/2021 0033 by Blase Mess, RN Outcome: Progressing 03/15/2021 0032 by Blase Mess, RN Outcome: Progressing

## 2021-03-15 NOTE — Discharge Instructions (Signed)
Gary Grimes,  During hospital because of abdominal pain, nausea, vomiting.  Most of this was secondary to your gastroparesis.  You are managed with Reglan with significant improvement.  Prior to discharge, you had some additional right upper quadrant pain.  Your work-up for gallbladder pathology was negative.  The gastroenterologist evaluated you at bedside and recommended to add Carafate in addition to twice a day Protonix.  Recommendation for you to follow-up with Dr. Cristina Gong in 4 weeks.  If symptomatic management does not work, consideration of outpatient surgical consult would be appropriate.  Please discuss with your primary care physician.

## 2021-03-19 ENCOUNTER — Other Ambulatory Visit: Payer: PRIVATE HEALTH INSURANCE

## 2021-03-31 ENCOUNTER — Other Ambulatory Visit: Payer: Self-pay | Admitting: Pulmonary Disease

## 2021-03-31 NOTE — Telephone Encounter (Signed)
Called and spoke with pt letting him know that [facility] sent med to pharmacy for him and he verbalized understanding. Nothing further needed.

## 2021-03-31 NOTE — Telephone Encounter (Signed)
Called and spoke with pt who states he is needing a refill of his Belsomra 20mg . States that he is completely out of the medication and states that he cannot sleep without taking this med. Dr. Jenetta Downer, please advise.

## 2021-04-15 ENCOUNTER — Other Ambulatory Visit: Payer: Self-pay | Admitting: Cardiology

## 2021-04-15 NOTE — Telephone Encounter (Signed)
Atorvastatin 10 mg # 90 x 3 refill sent to pharmacy

## 2021-04-28 ENCOUNTER — Ambulatory Visit: Payer: PRIVATE HEALTH INSURANCE | Admitting: Cardiology

## 2021-05-12 ENCOUNTER — Ambulatory Visit: Payer: PRIVATE HEALTH INSURANCE | Admitting: Podiatry

## 2021-05-20 ENCOUNTER — Telehealth: Payer: Self-pay | Admitting: Pulmonary Disease

## 2021-05-20 ENCOUNTER — Other Ambulatory Visit: Payer: Self-pay | Admitting: Pulmonary Disease

## 2021-05-20 NOTE — Telephone Encounter (Signed)
Addressed.

## 2021-05-20 NOTE — Telephone Encounter (Signed)
AO pt is requesting a refill of the belsomra.   Please advise. On this refill.  Thanks

## 2021-05-20 NOTE — Telephone Encounter (Signed)
Called and spoke with pt letting him know that AO refilled his Belsomra and pt verbalized understanding. Pt said that he has a new insurance and they are requiring a PA to be done. Stated to pt that we would get that taken care of for him and let him know once we have a response. Pt said that he does have a new insurance card which we do not have on file. Asked pt if he could bring that card by the office so we can get it placed in his chart to have that info for the PA and he said that he would come by office next day or two to have insurance card scanned into his chart.    Checked to see if we had received a PA from pharmacy on pt and we had not. Tried to call pt's pharmacy to see if they could provide me pt's new insurance info and I could not understand the person I was talking to from pt's pharmacy to be able to get the information written down.  Sent pt a mychart message to see if he can take a picture of his insurance card and post it into the [facility] message so we could go ahead and get process started for PA. Will await response.

## 2021-05-21 NOTE — Telephone Encounter (Signed)
PA was started via [email or web address]. Key is [id number]. Will check back later for a determination.

## 2021-05-29 ENCOUNTER — Other Ambulatory Visit: Payer: Self-pay

## 2021-05-29 ENCOUNTER — Ambulatory Visit: Payer: Managed Care, Other (non HMO) | Admitting: Endocrinology

## 2021-05-29 VITALS — BP 132/68 | HR 86 | Ht 72.0 in | Wt 297.2 lb

## 2021-05-29 DIAGNOSIS — R7989 Other specified abnormal findings of blood chemistry: Secondary | ICD-10-CM | POA: Diagnosis not present

## 2021-05-29 DIAGNOSIS — E119 Type 2 diabetes mellitus without complications: Secondary | ICD-10-CM

## 2021-05-29 HISTORY — DX: Other specified abnormal findings of blood chemistry: R79.89

## 2021-05-29 LAB — POCT GLYCOSYLATED HEMOGLOBIN (HGB A1C): Hemoglobin A1C: 8.2 % — AB (ref 4.0–5.6)

## 2021-05-29 MED ORDER — NOVOLIN 70/30 (70-30) 100 UNIT/ML ~~LOC~~ SUSP: SUBCUTANEOUS | 3 refills | Status: DC

## 2021-05-29 NOTE — Progress Notes (Signed)
Subjective:    Patient ID: Gary Grimes, male    DOB: 1961/02/16, 60 y.o.   MRN: [id number]  HPI pt is referred by Dr Darron Doom, for diabetes.  Pt states DM was dx'ed in 5809; it is complicated by PN, GP, and CAD; he has been on insulin since dx; pt says his diet and exercise are not good; he has never had pancreatitis, pancreatic surgery, severe hypoglycemia or DKA.  He had poss mild pancreatitis 3/22.  Ozempic was d/c'ed.  He takes 70/30 insulin, 30 units BID.  He says cbg varies from 145-496.  It is in general higher as the day goes on.  He declines multiple daily injections.  He did not tolerate Jardiance (dehydration) or metformin (abd pain).   He also reports h/o low testosterone.   Past Medical History:  Diagnosis Date  . Achilles tendon contracture, left   . Acquired equinus deformity of both feet 10/22/2019  . ADHD   . Anginal pain ([place])    ER visit 01/14/2014 visit on chart   . Anxiety    pt denies  . Arthritis   . Bipolar 1 disorder ([place]) 01/15/2020  . Bipolar disorder ([place])   . Bipolar disorder, manic ([place]) 05/29/2016  . Chronic a-fib ([place]) 06/06/2018  . Chronic insomnia 08/01/2020  . Chronic lower back pain   . Chronic pansinusitis 04/28/2019  . Constipation   . Controlled type 2 diabetes mellitus, with long-term current use of insulin ([place]) 06/06/2018  . 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/19/2020  . Depression    did get seen in er 4/13 for evaluation-psyc situational none recent  . DOE (dyspnea on exertion) 11/29/2019  . Essential hypertension 06/06/2018  . GERD (gastroesophageal reflux disease)   . Headache    none recent  . High cholesterol   . High triglycerides   . HLD (hyperlipidemia) 01/15/2020  . Hypertension   . Increased body mass index 09/19/2020  . Joint pain   . Levator syndrome 2001   history   . Lumbar pain 09/19/2008   Qualifier: Diagnosis of  By: Aline Brochure MD, Dorothyann Peng    . Lumbar radiculopathy 09/19/2020  . MDD (major depressive disorder),  recurrent severe, without psychosis ([place]) 12/27/2015  . Mild CAD 05/17/2019  . Neuropathy   . OSA (obstructive sleep apnea) 01/15/2020  . OSA on CPAP   . Overweight 06/06/2018  . Pain in thoracic spine 09/19/2008   Qualifier: Diagnosis of  By: Aline Brochure MD, Dorothyann Peng    . Panic attacks   . Perirectal abscess   . Plantar fasciitis of left foot 02/28/2019  . Prediabetes   . Preoperative cardiovascular examination 05/17/2019  . S/P ablation of atrial fibrillation   . S/P nasal septoplasty 05/31/2019  . Sleep apnea 06/06/2018  . SPONDYLOSIS 09/19/2008   Qualifier: Diagnosis of  By: Aline Brochure MD, Dorothyann Peng    . Type 1 diabetes mellitus ([place]) 09/19/2020  . Type II diabetes mellitus ([place])   . Type II diabetes mellitus with neurological manifestations ([place]) 02/28/2019  . Ventral incisional hernia 01/29/2015  . Vitamin D deficiency   . Work related injury 09/19/2020    Past Surgical History:  Procedure Laterality Date  . ANAL FISSURE REPAIR  08/05/2000   proctoscopy  . APPENDECTOMY  1984  . ATRIAL FIBRILLATION ABLATION  10/28/2018  . ATRIAL FIBRILLATION ABLATION N/A 10/28/2018   Procedure: ATRIAL FIBRILLATION ABLATION;  Surgeon: Constance Haw, MD;  Location: [facility];  Service: Cardiovascular;  Laterality: N/A;  . BIOPSY  05/24/2019   Procedure: BIOPSY;  Surgeon: Ronald Lobo, MD;  Location: [facility];  Service: Endoscopy;;  . BIOPSY  08/10/2019   Procedure: BIOPSY;  Surgeon: Ronald Lobo, MD;  Location: [facility];  Service: Endoscopy;;  . COLONOSCOPY  2011  . COLONOSCOPY WITH PROPOFOL N/A 08/10/2019   Procedure: COLONOSCOPY WITH PROPOFOL;  Surgeon: Ronald Lobo, MD;  Location: [facility];  Service: Endoscopy;  Laterality: N/A;  . ESOPHAGOGASTRODUODENOSCOPY (EGD) WITH PROPOFOL N/A 05/24/2019   Procedure: ESOPHAGOGASTRODUODENOSCOPY (EGD) WITH PROPOFOL;  Surgeon: Ronald Lobo, MD;  Location: [facility];  Service: Endoscopy;  Laterality: N/A;  . GASTROCNEMIUS RECESSION Left  11/03/2019   Procedure: LEFT GASTROCNEMIUS RECESSION;  Surgeon: Newt Minion, MD;  Location: [facility];  Service: Orthopedics;  Laterality: Left;  . HERNIA REPAIR    . INSERTION OF MESH N/A 01/29/2015   Procedure: INSERTION OF MESH;  Surgeon: Excell Seltzer, MD;  Location: [facility];  Service: General;  Laterality: N/A;  . IRRIGATION AND DEBRIDEMENT ABSCESS  02/18/2012   peri-rectal  . LEFT HEART CATH AND CORONARY ANGIOGRAPHY N/A 06/08/2018   Procedure: LEFT HEART CATH AND CORONARY ANGIOGRAPHY;  Surgeon: Leonie Man, MD;  Location: [facility];  Service: Cardiovascular;  Laterality: N/A;  . LEFT HEART CATH AND CORONARY ANGIOGRAPHY N/A 10/18/2020   Procedure: LEFT HEART CATH AND CORONARY ANGIOGRAPHY;  Surgeon: Martinique, Peter M, MD;  Location: [facility];  Service: Cardiovascular;  Laterality: N/A;  . NASAL SEPTOPLASTY W/ TURBINOPLASTY  05/31/2019  . NASAL SEPTOPLASTY W/ TURBINOPLASTY Bilateral 05/31/2019   Procedure: NASAL SEPTOPLASTY WITH BILATERAL TURBINATE REDUCTION;  Surgeon: Leta Baptist, MD;  Location: [facility];  Service: ENT;  Laterality: Bilateral;  . PLANTAR FASCIA RELEASE Left 11/03/2019   Procedure: PLANTAR FASCIA RELEASE LEFT FOOT;  Surgeon: Newt Minion, MD;  Location: [facility];  Service: Orthopedics;  Laterality: Left;  . POLYPECTOMY  08/10/2019   Procedure: POLYPECTOMY;  Surgeon: Ronald Lobo, MD;  Location: [facility];  Service: Endoscopy;;  . SHOULDER ARTHROSCOPY Left ?2009   "repaired  AC joint; reattached bicept tendon"  . SHOULDER ARTHROSCOPY W/ LABRAL REPAIR Left 08/08/2007  . UMBILICAL HERNIA REPAIR  10/27/2010  . VENTRAL HERNIA REPAIR N/A 01/29/2015   Procedure: LAPAROSCOPIC VENTRAL HERNIA;  Surgeon: Excell Seltzer, MD;  Location: [facility];  Service: General;  Laterality: N/A;    Social History   Socioeconomic History  . Marital status: Widowed    Spouse name: Not on file  . Number of children: Not on file  . Years of education: Not on file  . Highest  education level: Not on file  Occupational History  . 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 Smoking status: Former Smoker    Packs/day: 0.50    Years: 1.00    Pack years: 0.50    Types: Cigarettes  . Smokeless tobacco: Never Used  . Tobacco comment: quit 1983  Vaping Use  . Vaping Use: Never used  Substance and Sexual Activity  . Alcohol use: Not Currently    Comment: rare wine  . Drug use: Not Currently    Comment: not since 70'S  . Sexual activity: Not on file  Other Topics Concern  . Not on file  Social History Narrative  . Not on file   Social Determinants of Health   Financial Resource Strain: Not on file  Food Insecurity: Not on file  Transportation Needs: Not on file  Physical Activity: Not on file  Stress: Not on file  Social Connections: Not on file  Intimate Partner Violence: Not on file    Current Outpatient Medications on File Prior to Visit  Medication Sig Dispense Refill  . aspirin EC 81 MG tablet Take 1 tablet (81 mg total) by mouth daily. Swallow whole. 90 tablet 3  . atorvastatin (LIPITOR) 10 MG tablet TAKE 1 TABLET BY MOUTH EVERY DAY 90 tablet 3  . BELSOMRA 20 MG TABS TAKE 1 TABLET BY MOUTH EVERY DAY 30 tablet 1  . hydrOXYzine (ATARAX/VISTARIL) 25 MG tablet Take 25 mg by mouth at bedtime.    . methocarbamol (ROBAXIN) 750 MG tablet Take 750 mg by mouth daily as needed for muscle spasms.    . nitroGLYCERIN (NITROSTAT) 0.4 MG SL tablet Place 0.4 mg under the tongue daily. Every 5 minutes for chest pain    . olmesartan (BENICAR) 40 MG tablet Take 40 mg by mouth daily.    . pantoprazole (PROTONIX) 40 MG tablet Take 1 tablet (40 mg total) by mouth 2 (two) times daily. 180 tablet 0  . pregabalin (LYRICA) 200 MG capsule Take 200 mg by mouth 3 (three) times daily.    . traZODone (DESYREL) 100 MG tablet Take 200 mg by mouth at bedtime as needed for sleep.     No current facility-administered medications on file prior to visit.    Allergies   Allergen Reactions  . Morphine Other (See Comments)    PT BECAME DELIRIOUS  PT BECAME DELIRIOUS  PT BECAME DELIRIOUS     Family History  Problem Relation Age of Onset  . Breast cancer Mother   . Ovarian cancer Mother   . Diabetes Mother   . Hypertension Mother   . Hyperlipidemia Mother   . Heart disease Mother   . Sleep apnea Mother   . Obesity Mother   . Diabetes Father   . Hypertension Father   . Hyperlipidemia Father   . Heart disease Father   . Depression Father   . Anxiety disorder Father   . Bipolar disorder Father   . Sleep apnea Father   . Obesity Father     BP 132/68 (BP Location: Right Arm, Patient Position: Sitting, Cuff Size: Large)   Pulse 86   Ht 6' (1.829 m)   Wt 297 lb 3.2 oz (134.8 kg)   SpO2 96%   BMI 40.31 kg/m   Review of Systems denies sob, n/v, and depression.  He has weight gain.      Objective:   Physical Exam VITAL SIGNS:  See vs page GENERAL: no distress Pulses: dorsalis pedis intact bilat.   MSK: no deformity of the feet CV: no leg edema Skin:  no ulcer on the feet.  normal color and temp on the feet. Neuro: sensation is intact to touch on the feet  Lab Results  Component Value Date   HGBA1C 8.2 (A) 05/29/2021     Lab Results  Component Value Date   CREATININE 1.18 03/14/2021   BUN 11 03/14/2021   NA 141 03/14/2021   K 3.7 03/14/2021   CL 102 03/14/2021   CO2 28 03/14/2021    I have reviewed outside records, and summarized: Pt was noted to have elevated A1c, and referred here.  He was rx'ed with several different insulin regimens, but none achieved good glycemic control      Assessment & Plan:  Insulin-requiring type 2 DM: uncontrolled  Patient Instructions  good diet and exercise significantly improve the control of your diabetes.  please let me know  if you wish to be referred to a dietician.  high blood sugar is very risky to your health.  you should see an eye doctor and dentist every year.  It is very important  to get all recommended vaccinations.  Controlling your blood pressure and cholesterol drastically reduces the damage diabetes does to your body.  Those who smoke should quit.  Please discuss these with your doctor.  check your blood sugar twice a day.  vary the time of day when you check, between before the 3 meals, and at bedtime.  also check if you have symptoms of your blood sugar being too high or too low.  please keep a record of the readings and bring it to your next appointment here (or you can bring the meter itself).  You can write it on any piece of paper.  please call us sooner if your blood sugar goes below 70, or if most of your readings are over 200.  Please increase the insulin to 40 units with breakfast, and 30 with the evening meal. Here is a brochure about the "V-GO" pump.   Blood tests are requested for you today.  We'll let you know about the results.  Please come back for a follow-up appointment in 3 months.

## 2021-05-29 NOTE — Patient Instructions (Addendum)
good diet and exercise significantly improve the control of your diabetes.  please let me know if you wish to be referred to a dietician.  high blood sugar is very risky to your health.  you should see an eye doctor and dentist every year.  It is very important to get all recommended vaccinations.  Controlling your blood pressure and cholesterol drastically reduces the damage diabetes does to your body.  Those who smoke should quit.  Please discuss these with your doctor.  check your blood sugar twice a day.  vary the time of day when you check, between before the 3 meals, and at bedtime.  also check if you have symptoms of your blood sugar being too high or too low.  please keep a record of the readings and bring it to your next appointment here (or you can bring the meter itself).  You can write it on any piece of paper.  please call us sooner if your blood sugar goes below 70, or if most of your readings are over 200.  Please increase the insulin to 40 units with breakfast, and 30 with the evening meal. Here is a brochure about the "V-GO" pump.   Blood tests are requested for you today.  We'll let you know about the results.  Please come back for a follow-up appointment in 3 months.

## 2021-05-29 NOTE — Telephone Encounter (Signed)
Checked CMM for status update: Outcome Approvedon May 31 CaseId:[id number];Status:Approved;Review Type:Prior Auth;Coverage Start Date:05/21/2021;Coverage End Date:05/27/2022  Called and spoke with Ulice Dash at pt's pharmacy and they are aware of the approved PA. Ulice Dash stated they are waiting for the med to come in for pt as it has been ordered. Nothing further needed.

## 2021-05-30 ENCOUNTER — Telehealth: Payer: Self-pay | Admitting: Endocrinology

## 2021-05-30 LAB — LUTEINIZING HORMONE: LH: 5.76 m[IU]/mL (ref 1.50–9.30)

## 2021-05-30 LAB — PROLACTIN: Prolactin: 3.3 ng/mL (ref 2.0–18.0)

## 2021-05-30 NOTE — Telephone Encounter (Signed)
Patient requests to be called at ph# [phone] to be given and advised of lab results-Today if possible please

## 2021-05-31 LAB — TESTOSTERONE,FREE AND TOTAL
Testosterone, Free: 5.5 pg/mL — ABNORMAL LOW (ref 6.6–18.1)
Testosterone: 304 ng/dL (ref 264–916)

## 2021-06-03 NOTE — Telephone Encounter (Signed)
Called and advised pt of lab results. Pt confirmed we wanted to wait before starting medication. He did want to know if there were any non medical alternatives to bringing his testosterone levels up.

## 2021-06-04 NOTE — Telephone Encounter (Signed)
Only non-medical option is that weight loss helps.

## 2021-06-04 NOTE — Telephone Encounter (Signed)
Called and left a detailed message for pt advising recommendation.

## 2021-06-17 ENCOUNTER — Encounter: Payer: Managed Care, Other (non HMO) | Attending: Endocrinology | Admitting: Nutrition

## 2021-06-17 ENCOUNTER — Other Ambulatory Visit: Payer: Self-pay

## 2021-06-17 DIAGNOSIS — E1149 Type 2 diabetes mellitus with other diabetic neurological complication: Secondary | ICD-10-CM | POA: Diagnosis present

## 2021-06-17 DIAGNOSIS — E1165 Type 2 diabetes mellitus with hyperglycemia: Secondary | ICD-10-CM | POA: Insufficient documentation

## 2021-06-17 DIAGNOSIS — Z794 Long term (current) use of insulin: Secondary | ICD-10-CM | POA: Insufficient documentation

## 2021-06-18 NOTE — Progress Notes (Signed)
We discussed how the V-Go works and the advantages of this therapy, over what he is doing now--70/30 insulin twice daily.  He was told that he could try this for 6 days for free, and he agreed to do this.  He would like to know the cost of this, before trying this.  V-go information sheet filled out and faxed to [facility].  He will call me once he hears from them.  He had no final questions.

## 2021-06-18 NOTE — Patient Instructions (Signed)
Call when you have received your cost for the V-gos to let me know if you would like to try this for 6 days.

## 2021-06-24 ENCOUNTER — Telehealth: Payer: Self-pay | Admitting: Pulmonary Disease

## 2021-06-24 NOTE — Telephone Encounter (Signed)
Left message for patient to call back  

## 2021-06-25 ENCOUNTER — Encounter: Payer: Self-pay | Admitting: Acute Care

## 2021-06-25 ENCOUNTER — Ambulatory Visit: Payer: Managed Care, Other (non HMO) | Admitting: Acute Care

## 2021-06-25 ENCOUNTER — Other Ambulatory Visit: Payer: Self-pay

## 2021-06-25 VITALS — BP 122/74 | HR 59 | Temp 97.3°F | Ht 72.0 in | Wt 290.4 lb

## 2021-06-25 DIAGNOSIS — Z9989 Dependence on other enabling machines and devices: Secondary | ICD-10-CM

## 2021-06-25 DIAGNOSIS — F5104 Psychophysiologic insomnia: Secondary | ICD-10-CM

## 2021-06-25 DIAGNOSIS — G4733 Obstructive sleep apnea (adult) (pediatric): Secondary | ICD-10-CM | POA: Diagnosis not present

## 2021-06-25 NOTE — Telephone Encounter (Signed)
Patient is returning phone call. Patient phone number is [phone].

## 2021-06-25 NOTE — Telephone Encounter (Signed)
I called the pt and he stated that he has appt today with SG at 3.  He will discuss issues at that time.

## 2021-06-25 NOTE — Patient Instructions (Signed)
It is good to see you today. Please call psychiatrist and have him decrease your Strattera dose. Let him know you are unable to sleep, and that this has started within  4 weeks of starting the Strattera.  We will send in a prescription to refill your Belsomra on 6/30. Belsomra at 20 mg at bedtime.  You are on several medications that make you sleepy at bedtime. Please be careful with these medications.  If you decrease or stop the [facility], please call and we will determine other options for treatment.    Continue on CPAP at bedtime. You appear to be benefiting from the treatment  Goal is to wear for at least 6 hours each night for maximal clinical benefit. Continue to work on weight loss, as the link between excess weight  and sleep apnea is well established.   Remember to establish a good bedtime routine, and work on sleep hygiene.  Limit daytime naps , avoid stimulants such as caffeine and nicotine close to bedtime, exercise daily to promote sleep quality, avoid heavy , spicy, fried , or rich foods before bed. Ensure adequate exposure to natural light during the day,establish a relaxing bedtime routine with a pleasant sleep environment ( Bedroom between 60 and 67 degrees, turn off bright lights , TV or device screens screens , consider black out curtains or white noise machines) Do not drive if sleepy. Remember to clean mask, tubing, filter, and reservoir once weekly with soapy water.  Follow up with Dr. Ander Slade  In 2 weeks  or before as needed.

## 2021-06-25 NOTE — Telephone Encounter (Signed)
Spoke with the pt  He states that the [place] has stopped working over the past 3-4 wks He is not sleeping at all and was very tearful that his insomnia is affecting the quality of his life  Appt with Judson Roch made for 3 pm today

## 2021-06-25 NOTE — Progress Notes (Signed)
History of Present Illness Gary Grimes is a 60 y.o. male with OSA on CPAP, chronic insomnia, obesity. Patient of Dr. Ander Grimes, last seen on 11/2020.  Maintained on CPAP 13cm H2O    06/25/2021 Pt. Presents for an acute visit. He has been maintained on Belsomra, but 3 weeks ago this medication stopped working. When I asked him what has changed in the last 3 weeks, he states he has recently been started ( 4 weeks ago) on Strattera 40 mg daily. This was started by his psychiatrist.We discussed that this is very likely the culprit. He is taking something to keep him awake during the day, and this is affecting sleep. He states he is taking this at 8 am daily.  He is only getting a few hours of sleep each night and he is very sleep deprived.He is very emotional and depressed. I asked him if he had thoughts of harming himself, and he strongly denied any suicidal ideations. He also takes hydroxyzine 25 mg at bedtime , and 200 mg trazodone at night.He was asking for additional medications for sleep and anxiety. I told him I would not be prescribing any additional sedating or anti-anxiety medications. I am also noyt increasing his Belsomra dose today. Marland KitchenHe understands he needs to reach out to his psychiatrist to decrease his Strattera dose and for any additional medications for anxiety or depression. If after decreasing Stimulant dose he is still having issues with sleep Dr. Ander Grimes can determine need for change in dose or type of sleep medication.  I reviewed his download. CPAP is + for leak on down Load. AHI is 7.4. He believes this is due to old head gear. He has ordered a new set of supplies.    Test Results: CPAP Down Load 5/30-6/28/2022 Set Pressure of 13 cm H2O Great compliance Needs to try to wear for longer each night if possible No am headaches Poor sleep quality due to Strattera.       CBC Latest Ref Rng & Units 03/11/2021 03/10/2021 02/28/2021  WBC 4.0 - 10.5 K/uL 8.1 9.7 8.2  Hemoglobin  13.0 - 17.0 g/dL 13.3 13.8 13.8  Hematocrit 39.0 - 52.0 % 38.9(L) 40.6 40.3  Platelets 150 - 400 K/uL 225 226 238    BMP Latest Ref Rng & Units 03/14/2021 03/11/2021 03/10/2021  Glucose 70 - 99 mg/dL 155(H) 151(H) 159(H)  BUN 8 - 23 mg/dL 11 18 23   Creatinine 0.61 - 1.24 mg/dL 1.18 1.11 1.14  BUN/Creat Ratio 10 - 24 - - -  Sodium 135 - 145 mmol/L 141 139 139  Potassium 3.5 - 5.1 mmol/L 3.7 4.1 4.4  Chloride 98 - 111 mmol/L 102 107 107  CO2 22 - 32 mmol/L 28 23 22   Calcium 8.9 - 10.3 mg/dL 9.7 9.1 9.7    BNP No results found for: BNP  ProBNP No results found for: PROBNP  PFT No results found for: FEV1PRE, FEV1POST, FVCPRE, FVCPOST, TLC, DLCOUNC, PREFEV1FVCRT, PSTFEV1FVCRT  No results found.   Past medical hx Past Medical History:  Diagnosis Date   Achilles tendon contracture, left    Acquired equinus deformity of both feet 10/22/2019   ADHD    Anginal pain ([place])    ER visit 01/14/2014 visit on chart    Anxiety    pt denies   Arthritis    Bipolar 1 disorder ([place]) 01/15/2020   Bipolar disorder ([place])    Bipolar disorder, manic ([place]) 05/29/2016   Chronic a-fib ([place]) 06/06/2018   Chronic insomnia  08/01/2020   Chronic lower back pain    Chronic pansinusitis 04/28/2019   Constipation    Controlled type 2 diabetes mellitus, with long-term current use of insulin ([place]) 06/06/2018   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/19/2020   Depression    did get seen in er 4/13 for evaluation-psyc situational none recent   DOE (dyspnea on exertion) 11/29/2019   Essential hypertension 06/06/2018   GERD (gastroesophageal reflux disease)    Headache    none recent   High cholesterol    High triglycerides    HLD (hyperlipidemia) 01/15/2020   Hypertension    Increased body mass index 09/19/2020   Joint pain    Levator syndrome 2001   history    Lumbar pain 09/19/2008   Qualifier: Diagnosis of  By: Gary Brochure MD, Gary Grimes     Lumbar radiculopathy 09/19/2020   MDD (major depressive disorder),  recurrent severe, 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   Mild CAD 05/17/2019   Neuropathy    OSA (obstructive sleep apnea) 01/15/2020   OSA on CPAP    Overweight 06/06/2018   Pain in thoracic spine 09/19/2008   Qualifier: Diagnosis of  By: Gary Brochure MD, Gary Grimes     Panic attacks    Perirectal abscess    Plantar fasciitis of left foot 02/28/2019   Prediabetes    Preoperative cardiovascular examination 05/17/2019   S/P ablation of atrial fibrillation    S/P nasal septoplasty 05/31/2019   Sleep apnea 06/06/2018   SPONDYLOSIS 09/19/2008   Qualifier: Diagnosis of  By: Gary Brochure MD, Gary Grimes     Type 1 diabetes mellitus ([place]) 09/19/2020   Type II diabetes mellitus ([place])    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 His smoking use included cigarettes. He has a 0.50 pack-year smoking history. He has never used smokeless tobacco. He reports previous alcohol use. He reports previous drug use.  Tobacco Cessation: Counseling given: Yes Tobacco comments: quit 1983 Former smoker  Past surgical hx, Family hx, Social hx all reviewed.  Current Outpatient Medications on File Prior to Visit  Medication Sig   aspirin EC 81 MG tablet Take 1 tablet (81 mg total) by mouth daily. Swallow whole.   atomoxetine (STRATTERA) 40 MG capsule Take 40 mg by mouth every morning.   atorvastatin (LIPITOR) 10 MG tablet TAKE 1 TABLET BY MOUTH EVERY DAY   BELSOMRA 20 MG TABS TAKE 1 TABLET BY  MOUTH EVERY DAY   hydrOXYzine (ATARAX/VISTARIL) 25 MG tablet Take 25 mg by mouth at bedtime.   insulin NPH-regular Human (NOVOLIN 70/30) (70-30) 100 UNIT/ML injection 40 units with breakfast, and 30 with the evening meal.   methocarbamol (ROBAXIN) 750 MG tablet Take 750 mg by mouth daily as needed for muscle spasms.   nitroGLYCERIN (NITROSTAT) 0.4 MG SL tablet Place 0.4 mg under the tongue daily.  Every 5 minutes for chest pain   olmesartan (BENICAR) 40 MG tablet Take 40 mg by mouth daily.   pregabalin (LYRICA) 200 MG capsule Take 200 mg by mouth 3 (three) times daily.   traZODone (DESYREL) 100 MG tablet Take 200 mg by mouth at bedtime as needed for sleep.   pantoprazole (PROTONIX) 40 MG tablet Take 1 tablet (40 mg total) by mouth 2 (two) times daily.   No current facility-administered medications on file prior to visit.     Allergies  Allergen Reactions   Morphine Other (See Comments)    PT BECAME DELIRIOUS  PT BECAME DELIRIOUS  PT BECAME DELIRIOUS     Review Of Systems:  Constitutional:   No  weight loss, night sweats,  Fevers, chills, fatigue, or  lassitude.  HEENT:   No headaches,  Difficulty swallowing,  Tooth/dental problems, or  Sore throat,                No sneezing, itching, ear ache, nasal congestion, post nasal drip,   CV:  No chest pain,  Orthopnea, PND, swelling in lower extremities, anasarca, dizziness, palpitations, syncope.   GI  No heartburn, indigestion, abdominal pain, nausea, vomiting, diarrhea, change in bowel habits, loss of appetite, bloody stools.   Resp: No shortness of breath with exertion or at rest.  No excess mucus, no productive cough,  No non-productive cough,  No coughing up of blood.  No change in color of mucus.  No wheezing.  No chest wall deformity  Skin: no rash or lesions.  GU: no dysuria, change in color of urine, no urgency or frequency.  No flank pain, no hematuria   MS:  No joint pain or swelling.  No decreased range of motion.  No back  pain.  Psych:  No change in mood or affect. No depression or anxiety.  No memory loss.   Vital Signs BP 122/74 (BP Location: Left Arm, Cuff Size: Normal)   Pulse (!) 59   Temp (!) 97.3 F (36.3 C) (Oral)   Ht 6' (1.829 m)   Wt 290 lb 6.4 oz (131.7 kg)   SpO2 97%   BMI 39.39 kg/m    Physical Exam:  General- No distress,  A&Ox3 ENT: No sinus tenderness, TM clear, pale nasal mucosa, no oral exudate,no post nasal drip, no LAN Cardiac: S1, S2, regular rate and rhythm, no murmur Chest: No wheeze/ rales/ dullness; no accessory muscle use, no nasal flaring, no sternal retractions Abd.: Soft Non-tender Ext: No clubbing cyanosis, edema Neuro:  normal strength Skin: No rashes, warm and dry Psych: normal mood and behavior   Assessment/Plan  Acute Visit for  new inability to sleep on Belsomra 20 mg Recent drug start of Strattera which co-insides with inability to sleep. Also takes hydroxyzine 25 mg at bedtime , and 200 mg trazodone at night. Plan Please call psychiatrist and have him decrease your Strattera dose. Let him know you are unable to sleep, and that this has started within  4 weeks of starting the Strattera.  We will send in a prescription to refill your Belsomra on 6/30. Belsomra at 20 mg at bedtime.  You are on several medications that make you sleepy at bedtime. Please be careful with these medications.  If you decrease or stop the [facility], please call and we will determine other options for treatment.  Follow up with Dr. Ander Grimes in 2 weeks to see if sleep issue has improved with decrease in Strattera  OSA on CPAP Plan  Continue on CPAP at bedtime. You appear to be benefiting from the treatment  Goal is to wear for at least 6 hours each night for maximal clinical benefit. Continue to work on weight loss, as the link between excess weight  and sleep apnea is well established.   Remember to establish a good bedtime routine, and work on sleep hygiene.  Limit daytime  naps , avoid stimulants such as caffeine and nicotine close to bedtime, exercise daily to promote sleep quality, avoid heavy , spicy, fried , or rich foods before bed. Ensure adequate exposure to natural light during the day,establish a relaxing bedtime routine with a pleasant sleep environment ( Bedroom between 60 and 67 degrees, turn off bright lights , TV or device screens screens , consider black out curtains or white noise machines) Do not drive if sleepy. Remember to clean mask, tubing, filter, and reservoir once weekly with soapy water.  Please use your new head gear to see if this improves leak noted on down load.  Follow up with Dr. Ander Grimes  In 2 weeks  or before as needed.     I spent 40 minutes dedicated to the care of this patient on the date of this encounter to include pre-visit review of records, face-to-face time with the patient discussing conditions above, post visit ordering of testing, clinical documentation with the electronic health record, making appropriate referrals as documented, and communicating necessary information to the patient's healthcare team.    Magdalen Spatz, NP 06/25/2021  3:12 PM

## 2021-06-27 ENCOUNTER — Encounter: Payer: Self-pay | Admitting: Acute Care

## 2021-07-08 ENCOUNTER — Telehealth: Payer: Self-pay | Admitting: Pulmonary Disease

## 2021-07-08 MED ORDER — CLONAZEPAM 1 MG PO TABS: ORAL_TABLET | ORAL | 0 refills | Status: DC

## 2021-07-08 NOTE — Telephone Encounter (Signed)
I am sending a script for clonazepam 1 mg, # 3 tabs. He can take one each night, but may not combine them with other sleep meds. He needs to talk with his psychiatrist about his Strattera dose, which may be keeping him awake. His psychiatrist also needs to determine if his BiPolar meds need changing. I can talk with patient as a televisit on Thursday, or see him then in person.

## 2021-07-08 NOTE — Progress Notes (Signed)
07/09/21-Virtual Visit via Telephone Note  I connected with Gary Grimes on 07/10/21 at  9:30 AM EDT by telephone and verified that I am speaking with the correct person using two identifiers.  Location: Patient: Home Provider: Office   I discussed the limitations, risks, security and privacy concerns of performing an evaluation and management service by telephone and the availability of in person appointments. I also discussed with the patient that there may be a patient responsible charge related to this service. The patient expressed understanding and agreed to proceed.   History of Present Illness:  50 yoM former smoker followed by Dr Ander Slade for Insomnia, OSA on CPAP, complicated by BiPolar, Depression, Panic attacks, CAD, HTN, AFib, Pansinusitis, GERD, DM2/ gastroparesis, GERD,  Seen by NP 6/29 with worse insomnia attributed to initiation of  Strattera by psychiatry. With that medication, Belsomra up to 20 mg said to stop working. He has also been taking hydroxyzine 25 mg and trazodone 200 mg.  He called 7/12, reporting virtually no sleep in 2 weeks and unable to function. I authorized clonazepam 1 mg, # 3, 1 each night as needed- not to be combined with other sleep meds. CPAP- 13/ [employer] Download-compliance 77%, AHI 5.8/ hr -----Doing better, getting more sleep We had sent a 3 day trial of clonazepam to help with his panic associated with out of control insomnia. He was crying here in office when he saw NP recently. He now reports sleeping much better with clonazepam. Sounded calm and appropriate on phone today. He reports stopping Strattera from his psychiatrist 2.5 weeks ago- out of control insomnia had started with this med.  We emphasized not to combine other sedatives with clonazeam.  He will keep his psychiatrist up to date with med changes.    Observations/Objective:   Assessment and Plan: Exacerbation Insomnia BiPolar  Follow Up Instructions: Keep appointment with Dr  Ander Slade Clonazepam refilled for 1 month.   I discussed the assessment and treatment plan with the patient. The patient was provided an opportunity to ask questions and all were answered. The patient agreed with the plan and demonstrated an understanding of the instructions.   The patient was advised to call back or seek an in-person evaluation if the symptoms worsen or if the condition fails to improve as anticipated.  I provided 15 minutes of non-face-to-face time during this encounter.   Baird Lyons, MD

## 2021-07-08 NOTE — Telephone Encounter (Signed)
I will forward to Dr. Ander Slade, non-urgent. He has an apt with him in 2 weeks

## 2021-07-08 NOTE — Telephone Encounter (Signed)
Call returned to patient, patient states he has not slept more than an hour in the last two weeks and he is making a lot of mistakes at work. He reports he is leaving equipment at work sites that he is now having to drive more than a hour to go back and get and/or correct. Patient begging for help. I made him aware I would see what I could do.   I did speak with DOD but patient is requesting this to be addressed as soon as possible.   CY would be willing to see this patient for a virtual visit on Thursday. No appt with apps available and [facility] and [name] are full as well.

## 2021-07-08 NOTE — Telephone Encounter (Signed)
Call returned to patient, confirmed DOB. He reports his insomnia has gotten worse and his medication is not helping at all because he is not sleeping at all. He reports he cannot do his job and he is very miserable. He is wanting to increase the dose of his belsomra to see if they will help however he states he is not sleeping for days at a time at this point. He is requesting any recommendations that will help him sleep.   EW please advise. Thanks :)

## 2021-07-08 NOTE — Telephone Encounter (Signed)
Call returned to patient, confirmed DOB. Made aware of CY recommendations. Confirmed pharmacy. Patient will pick up medications today. Patient reports his [employer] has been D/C for 2 weeks now. Virtual appt made for 07/10/21 with CY.   Nothing further needed at this time.

## 2021-07-10 ENCOUNTER — Ambulatory Visit (INDEPENDENT_AMBULATORY_CARE_PROVIDER_SITE_OTHER): Payer: Managed Care, Other (non HMO) | Admitting: Internal Medicine

## 2021-07-10 ENCOUNTER — Other Ambulatory Visit: Payer: Self-pay

## 2021-07-10 ENCOUNTER — Encounter: Payer: Self-pay | Admitting: Internal Medicine

## 2021-07-10 DIAGNOSIS — F5104 Psychophysiologic insomnia: Secondary | ICD-10-CM

## 2021-07-10 DIAGNOSIS — Z9989 Dependence on other enabling machines and devices: Secondary | ICD-10-CM | POA: Diagnosis not present

## 2021-07-10 DIAGNOSIS — F3110 Bipolar disorder, current episode manic without psychotic features, unspecified: Secondary | ICD-10-CM | POA: Diagnosis not present

## 2021-07-10 DIAGNOSIS — G4733 Obstructive sleep apnea (adult) (pediatric): Secondary | ICD-10-CM | POA: Diagnosis not present

## 2021-07-10 MED ORDER — CLONAZEPAM 1 MG PO TABS: ORAL_TABLET | ORAL | 0 refills | Status: DC

## 2021-07-10 NOTE — Assessment & Plan Note (Signed)
To follow up with his psychiatrist

## 2021-07-10 NOTE — Patient Instructions (Signed)
I'm glad clonazepam has helped.  We are sending a script for clonazepam to last until you can see Dr Ander Slade again later this month.  As we agreed, it is important to avoid over -sedation, so do not combine clonazepam with hydroxyzine, trazodone or Belsomra, at least until you discuss with Dr Ander Slade.   Please keep your psychiatrist up to date with your medication changes- especially since you have stopped Strattera.

## 2021-07-10 NOTE — Assessment & Plan Note (Signed)
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  He has stopped that on his own. Clonazepam has helped- replacing other sleep meds for now. Plan- he will work with Dr Ander Slade and his psychiatrist. Clonazepam script sent for 1 month.

## 2021-07-10 NOTE — Assessment & Plan Note (Signed)
-   Continues CPAP  

## 2021-07-11 ENCOUNTER — Encounter: Payer: Self-pay | Admitting: Family Medicine

## 2021-07-11 ENCOUNTER — Ambulatory Visit (INDEPENDENT_AMBULATORY_CARE_PROVIDER_SITE_OTHER): Payer: Managed Care, Other (non HMO) | Admitting: Family Medicine

## 2021-07-11 VITALS — BP 134/76 | HR 63 | Temp 97.6°F | Ht 71.0 in | Wt 289.2 lb

## 2021-07-11 DIAGNOSIS — Z23 Encounter for immunization: Secondary | ICD-10-CM | POA: Diagnosis not present

## 2021-07-11 DIAGNOSIS — E782 Mixed hyperlipidemia: Secondary | ICD-10-CM

## 2021-07-11 DIAGNOSIS — E559 Vitamin D deficiency, unspecified: Secondary | ICD-10-CM

## 2021-07-11 DIAGNOSIS — Z62810 Personal history of physical and sexual abuse in childhood: Secondary | ICD-10-CM | POA: Insufficient documentation

## 2021-07-11 DIAGNOSIS — K219 Gastro-esophageal reflux disease without esophagitis: Secondary | ICD-10-CM

## 2021-07-11 DIAGNOSIS — I25118 Atherosclerotic heart disease of native coronary artery with other forms of angina pectoris: Secondary | ICD-10-CM | POA: Diagnosis not present

## 2021-07-11 DIAGNOSIS — I10 Essential (primary) hypertension: Secondary | ICD-10-CM | POA: Diagnosis not present

## 2021-07-11 DIAGNOSIS — Z860100 Personal history of colon polyps, unspecified: Secondary | ICD-10-CM | POA: Insufficient documentation

## 2021-07-11 DIAGNOSIS — F319 Bipolar disorder, unspecified: Secondary | ICD-10-CM

## 2021-07-11 DIAGNOSIS — E1142 Type 2 diabetes mellitus with diabetic polyneuropathy: Secondary | ICD-10-CM

## 2021-07-11 DIAGNOSIS — E6609 Other obesity due to excess calories: Secondary | ICD-10-CM | POA: Insufficient documentation

## 2021-07-11 DIAGNOSIS — E1165 Type 2 diabetes mellitus with hyperglycemia: Secondary | ICD-10-CM | POA: Diagnosis not present

## 2021-07-11 DIAGNOSIS — Z1159 Encounter for screening for other viral diseases: Secondary | ICD-10-CM

## 2021-07-11 DIAGNOSIS — F909 Attention-deficit hyperactivity disorder, unspecified type: Secondary | ICD-10-CM

## 2021-07-11 DIAGNOSIS — Z794 Long term (current) use of insulin: Secondary | ICD-10-CM

## 2021-07-11 DIAGNOSIS — Z6841 Body Mass Index (BMI) 40.0 and over, adult: Secondary | ICD-10-CM

## 2021-07-11 DIAGNOSIS — F5104 Psychophysiologic insomnia: Secondary | ICD-10-CM

## 2021-07-11 HISTORY — DX: Morbid (severe) obesity due to excess calories: E66.01

## 2021-07-11 HISTORY — DX: Body Mass Index (BMI) 40.0 and over, adult: Z684

## 2021-07-11 HISTORY — DX: Personal history of physical and sexual abuse in childhood: Z62.810

## 2021-07-11 HISTORY — DX: Personal history of colonic polyps: Z86.010

## 2021-07-11 HISTORY — DX: Personal history of colon polyps, unspecified: Z86.0100

## 2021-07-11 MED ORDER — CARVEDILOL 3.125 MG PO TABS: 3.1250 mg | ORAL_TABLET | Freq: Two times a day (BID) | ORAL | 3 refills | Status: DC

## 2021-07-11 MED ORDER — PREGABALIN 200 MG PO CAPS: 200.0000 mg | ORAL_CAPSULE | Freq: Two times a day (BID) | ORAL | 3 refills | Status: DC

## 2021-07-11 MED ORDER — OLMESARTAN MEDOXOMIL 40 MG PO TABS: 40.0000 mg | ORAL_TABLET | Freq: Every day | ORAL | 3 refills | Status: DC

## 2021-07-11 MED ORDER — PANTOPRAZOLE SODIUM 40 MG PO TBEC: 40.0000 mg | DELAYED_RELEASE_TABLET | Freq: Two times a day (BID) | ORAL | 3 refills | Status: DC
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
## 2021-07-14 ENCOUNTER — Other Ambulatory Visit (INDEPENDENT_AMBULATORY_CARE_PROVIDER_SITE_OTHER): Payer: Managed Care, Other (non HMO)

## 2021-07-14 ENCOUNTER — Other Ambulatory Visit: Payer: Self-pay

## 2021-07-14 DIAGNOSIS — Z794 Long term (current) use of insulin: Secondary | ICD-10-CM | POA: Diagnosis not present

## 2021-07-14 DIAGNOSIS — E782 Mixed hyperlipidemia: Secondary | ICD-10-CM

## 2021-07-14 DIAGNOSIS — E559 Vitamin D deficiency, unspecified: Secondary | ICD-10-CM

## 2021-07-14 DIAGNOSIS — Z1159 Encounter for screening for other viral diseases: Secondary | ICD-10-CM

## 2021-07-14 DIAGNOSIS — E1165 Type 2 diabetes mellitus with hyperglycemia: Secondary | ICD-10-CM

## 2021-07-14 LAB — BASIC METABOLIC PANEL
BUN: 14 mg/dL (ref 6–23)
CO2: 29 mEq/L (ref 19–32)
Calcium: 9.3 mg/dL (ref 8.4–10.5)
Chloride: 103 mEq/L (ref 96–112)
Creatinine, Ser: 1.02 mg/dL (ref 0.40–1.50)
GFR: 78.83 mL/min (ref >60.00)
Glucose, Bld: 268 mg/dL — ABNORMAL HIGH (ref 70–99)
Potassium: 3.7 mEq/L (ref 3.5–5.1)
Sodium: 140 mEq/L (ref 135–145)

## 2021-07-14 LAB — LIPID PANEL
Cholesterol: 112 mg/dL (ref 0–200)
HDL: 41.4 mg/dL (ref >39.00)
LDL Cholesterol: 36 mg/dL (ref 0–99)
NonHDL: 70.1
Total CHOL/HDL Ratio: 3
Triglycerides: 170 mg/dL — ABNORMAL HIGH (ref 0.0–149.0)
VLDL: 34 mg/dL (ref 0.0–40.0)

## 2021-07-14 LAB — VITAMIN D 25 HYDROXY (VIT D DEFICIENCY, FRACTURES): VITD: 48.63 ng/mL (ref 30.00–100.00)

## 2021-07-14 LAB — MICROALBUMIN / CREATININE URINE RATIO
Creatinine,U: 173.9 mg/dL
Microalb Creat Ratio: 7.6 mg/g (ref 0.0–30.0)
Microalb, Ur: 13.2 mg/dL — ABNORMAL HIGH (ref 0.0–1.9)

## 2021-07-14 LAB — HEMOGLOBIN A1C: Hgb A1c MFr Bld: 8.1 % — ABNORMAL HIGH (ref 4.6–6.5)

## 2021-07-14 NOTE — Progress Notes (Signed)
Per orders of Dr.rudd pt is here for labs, pt tolerated draw well.

## 2021-07-15 LAB — HCV AB W REFLEX TO QUANT PCR: HCV Ab: <0.1 s/co ratio (ref 0.0–0.9)

## 2021-07-15 LAB — HCV INTERPRETATION

## 2021-07-17 ENCOUNTER — Telehealth: Payer: Self-pay

## 2021-07-17 NOTE — Telephone Encounter (Signed)
Lft VM to rtn call. Dm/cma  

## 2021-07-17 NOTE — Telephone Encounter (Signed)
Pt looked at his lab results in [place]. He would like a call back to make sure he is understanding a couple of them correctly. Call back# [phone]  Thank you

## 2021-07-18 NOTE — Telephone Encounter (Signed)
Spoke to patient and reviewed labs and answered questions.  No further questions. Dm/cma

## 2021-07-24 ENCOUNTER — Encounter: Payer: Self-pay | Admitting: Nurse Practitioner

## 2021-07-24 ENCOUNTER — Ambulatory Visit: Payer: Managed Care, Other (non HMO) | Admitting: Pulmonary Disease

## 2021-07-24 ENCOUNTER — Encounter: Payer: Self-pay | Admitting: Pulmonary Disease

## 2021-07-24 ENCOUNTER — Ambulatory Visit (INDEPENDENT_AMBULATORY_CARE_PROVIDER_SITE_OTHER): Payer: Managed Care, Other (non HMO) | Admitting: Nurse Practitioner

## 2021-07-24 ENCOUNTER — Other Ambulatory Visit: Payer: Self-pay

## 2021-07-24 VITALS — BP 116/60 | HR 92 | Temp 97.0°F | Wt 290.4 lb

## 2021-07-24 VITALS — BP 110/64 | HR 70 | Ht 71.0 in | Wt 294.0 lb

## 2021-07-24 DIAGNOSIS — F5104 Psychophysiologic insomnia: Secondary | ICD-10-CM | POA: Diagnosis not present

## 2021-07-24 DIAGNOSIS — D492 Neoplasm of unspecified behavior of bone, soft tissue, and skin: Secondary | ICD-10-CM | POA: Diagnosis not present

## 2021-07-24 DIAGNOSIS — G4733 Obstructive sleep apnea (adult) (pediatric): Secondary | ICD-10-CM | POA: Diagnosis not present

## 2021-07-24 DIAGNOSIS — Z9989 Dependence on other enabling machines and devices: Secondary | ICD-10-CM | POA: Diagnosis not present

## 2021-07-24 MED ORDER — CLONAZEPAM 2 MG PO TABS: 2.0000 mg | ORAL_TABLET | Freq: Every day | ORAL | 3 refills | Status: DC

## 2021-07-24 NOTE — Progress Notes (Signed)
Subjective:  Patient ID: Gary Grimes, male    DOB: 1961/01/26  Age: 60 y.o. MRN: [id number]  CC: Acute Visit (Pt c/o spot on nose x 1 month. Pt states he was in the sun a lot over the weekend of July 4th and the spot formed around that time. Pt has tried otc topical ointment with no improvement and thinks it could possibly be infected. )  Rash This is a new problem. The current episode started 1 to 4 weeks ago (3weeks ago). The problem is unchanged. The affected locations include the face (nose). The rash is characterized by scaling and redness. Associated with: sunburn 3weeks ago while on the beach. Pertinent negatives include no congestion, eye pain, facial edema or rhinorrhea. Past treatments include antibiotic cream. The treatment provided no relief. There is no history of allergies, eczema or varicella.  Denies presence of any lesion prior to sunburn. Denies family history of any skin cancer.  Reviewed past Medical, Social and Family history today.  Outpatient Medications Prior to Visit  Medication Sig Dispense Refill   aspirin EC 81 MG tablet Take 1 tablet (81 mg total) by mouth daily. Swallow whole. 90 tablet 3   atorvastatin (LIPITOR) 10 MG tablet TAKE 1 TABLET BY MOUTH EVERY DAY 90 tablet 3   carvedilol (COREG) 3.125 MG tablet Take 1 tablet (3.125 mg total) by mouth 2 (two) times daily with a meal. 180 tablet 3   clonazePAM (KLONOPIN) 1 MG tablet 1 tab each night for sleep as needed. Do not combine with other sleep meds. 30 tablet 0   hydrOXYzine (ATARAX/VISTARIL) 25 MG tablet Take 25 mg by mouth at bedtime.     insulin NPH-regular Human (NOVOLIN 70/30) (70-30) 100 UNIT/ML injection 40 units with breakfast, and 30 with the evening meal. 70 mL 3   methocarbamol (ROBAXIN) 750 MG tablet Take 750 mg by mouth daily as needed for muscle spasms.     nitroGLYCERIN (NITROSTAT) 0.4 MG SL tablet Place 0.4 mg under the tongue daily. Every 5 minutes for chest pain     olmesartan (BENICAR) 40  MG tablet Take 1 tablet (40 mg total) by mouth daily. 90 tablet 3   pregabalin (LYRICA) 200 MG capsule Take 1 capsule (200 mg total) by mouth 2 (two) times daily. 180 capsule 3   traZODone (DESYREL) 100 MG tablet Take 200 mg by mouth at bedtime as needed for sleep.     VRAYLAR 3 MG capsule Take 3 mg by mouth daily.     BELSOMRA 20 MG TABS TAKE 1 TABLET BY MOUTH EVERY DAY (Patient not taking: Reported on 07/24/2021) 30 tablet 1   pantoprazole (PROTONIX) 40 MG tablet Take 1 tablet (40 mg total) by mouth 2 (two) times daily. (Patient not taking: Reported on 07/24/2021) 180 tablet 3   No facility-administered medications prior to visit.    ROS See HPI  Objective:  BP 116/60 (BP Location: Left Arm, Patient Position: Sitting, Cuff Size: Large)   Pulse 92   Temp (!) 97 F (36.1 C) (Temporal)   Wt 290 lb 6.4 oz (131.7 kg)   SpO2 96%   BMI 40.50 kg/m   Physical Exam HENT:     Nose:   Eyes:     Extraocular Movements: Extraocular movements intact.     Conjunctiva/sclera: Conjunctivae normal.  Neurological:     Mental Status: He is alert.    Assessment & Plan:  This visit occurred during the SARS-CoV-2 public health emergency.  Safety protocols were  in place, including screening questions prior to the visit, additional usage of staff PPE, and extensive cleaning of exam room while observing appropriate contact time as indicated for disinfecting solutions.   Gary Grimes was seen today for acute visit.  Diagnoses and all orders for this visit:  Atypical squamoproliferative skin lesion -     Cancel: Ambulatory referral to Dermatology -     Ambulatory referral to Dermatology No signs on cellulitis and/or bacteria infection. Possible basal call carcinoma, advised to use sunscreen while waiting for dermatology appt. He was notified about his appt with [facility] on 08/08/2021 at 9:45am. [address] [place], [place] [address] [phone]  Problem List Items  Addressed This Visit   None Visit Diagnoses     Atypical squamoproliferative skin lesion    -  Primary   Relevant Orders   Ambulatory referral to Dermatology       Follow-up: No follow-ups on file.  Wilfred Lacy, NP

## 2021-07-24 NOTE — Progress Notes (Signed)
Subjective:    Patient ID: Gary Grimes, male    DOB: 01/17/1961, 60 y.o.   MRN: [id number]  Patient with a history of chronic insomnia  Has used multiple medications to help him fall asleep Ambien worked initially but stopped working after a while Physiological scientist did not help at all Recently on Belsomra-this is stopped working as well Has been on Klonopin 1 mg, this seemed to help initially but now not as much  Compliant with CPAP  Multiple over-the-counter including Unisom, hydroxyzine medications in the past  He is on trazodone 200 mg nightly-chronic medication  No other recent medication changes  Has ended up in the hospital from using multiple medications to try to sleep in the past  Obstructive sleep apnea was diagnosed about 3 years ago, uses CPAP religiously He has not followed up for sleep apnea since then   Usually tries to go to bed about 10 PM Takes him over an hour to fall asleep Wakes up about 2-3 times during the night Final awakening time about 7 AM  Usually never feels restored with his sleep  He does have a history of ADHD, bipolar disorder-Not on any stimulating medications, concern for manic episodes  He gets very frustrated with not being able to sleep and on a few occasions has used multiple over-the-counter medications to help him get some sleep  He has started to exercise about 30 minutes a day but only walks slowlyas this is what he is able to tolerate, has managed to lose about 7 pounds  Past Medical History:  Diagnosis Date   Achilles tendon contracture, left    Acquired equinus deformity of both feet 10/22/2019   ADHD    Anginal pain ([place])    ER visit 01/14/2014 visit on chart    Anxiety    pt denies   Arthritis    Bipolar 1 disorder ([place]) 01/15/2020   Bipolar disorder ([place])    Bipolar disorder, manic ([place]) 05/29/2016   Chronic a-fib ([place]) 06/06/2018   Chronic insomnia 08/01/2020   Chronic lower back pain    Chronic pansinusitis 04/28/2019    Constipation    Controlled type 2 diabetes mellitus, with long-term current use of insulin ([place]) 06/06/2018   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/19/2020   Depression    did get seen in er 4/13 for evaluation-psyc situational none recent   DOE (dyspnea on exertion) 11/29/2019   Essential hypertension 06/06/2018   GERD (gastroesophageal reflux disease)    Headache    none recent   High cholesterol    High triglycerides    HLD (hyperlipidemia) 01/15/2020   Hypertension    Increased body mass index 09/19/2020   Joint pain    Levator syndrome 2001   history    Lumbar pain 09/19/2008   Qualifier: Diagnosis of  By: Aline Brochure MD, [place]     Lumbar radiculopathy 09/19/2020   MDD (major depressive disorder), recurrent severe, 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   Mild CAD 05/17/2019   Neuropathy    OSA (obstructive sleep apnea) 01/15/2020   OSA on CPAP    Overweight 06/06/2018   Pain in thoracic spine 09/19/2008   Qualifier: Diagnosis of  By: Aline Brochure MD, Stanley     Panic attacks    Perirectal abscess    Plantar fasciitis of left foot 02/28/2019   Prediabetes    Preoperative cardiovascular examination 05/17/2019   S/P ablation of atrial fibrillation    S/P nasal septoplasty 05/31/2019   Sleep apnea 06/06/2018  SPONDYLOSIS 09/19/2008   Qualifier: Diagnosis of  By: Aline Brochure MD, Stanley     Type 1 diabetes mellitus ([place]) 09/19/2020   Type II diabetes mellitus ([place])    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   Ventral incisional hernia 01/29/2015   Vitamin D deficiency    Work related injury 09/19/2020   Social History   Socioeconomic History   Marital status: Widowed    Spouse name: Not on file   Number of children: 3   Years of education: Not on file   Highest education level: Not on file  Occupational History   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    Comment: [employer]  Tobacco Use   Smoking status: Former     Packs/day: 0.50    Years: 1.00    Pack years: 0.50    Types: Cigarettes   Smokeless tobacco: Never   Tobacco comments:    quit 1983  Vaping Use   Vaping Use: Never used  Substance and Sexual Activity   Alcohol use: Not Currently    Comment: rare wine   Drug use: Not Currently    Comment: not since 70'S   Sexual activity: Yes  Other Topics Concern   Not on file  Social History Narrative   Not on file   Social Determinants of Health   Financial Resource Strain: Not on file  Food Insecurity: Not on file  Transportation Needs: Not on file  Physical Activity: Not on file  Stress: Not on file  Social Connections: Not on file  Intimate Partner Violence: Not on file   Family History  Problem Relation Age of Onset   Breast cancer Mother    Ovarian cancer Mother    Diabetes Mother    Hypertension Mother    Hyperlipidemia Mother    Heart disease Mother    Sleep apnea Mother    Obesity Mother    Diabetes Father    Hypertension Father    Hyperlipidemia Father    Heart disease Father    Depression Father    Anxiety disorder Father    Bipolar disorder Father    Sleep apnea Father    Obesity Father    Diabetes Maternal Grandmother     Review of Systems  Constitutional:  Negative for fever and unexpected weight change.  HENT:  Negative for congestion, dental problem, ear pain, nosebleeds, postnasal drip, rhinorrhea, sinus pressure, sneezing, sore throat and trouble swallowing.   Eyes:  Negative for redness and itching.  Respiratory:  Positive for chest tightness and shortness of breath. Negative for cough and wheezing.   Cardiovascular:  Positive for palpitations. Negative for leg swelling.  Gastrointestinal:  Negative for nausea and vomiting.  Genitourinary:  Negative for dysuria.  Musculoskeletal:  Negative for joint swelling.  Skin:  Negative for rash.  Allergic/Immunologic: Negative.  Negative for environmental allergies, food allergies and immunocompromised  state.  Neurological:  Positive for dizziness. Negative for headaches.  Hematological:  Does not bruise/bleed easily.  Psychiatric/Behavioral:  Positive for sleep disturbance. Negative for dysphoric mood. The patient is nervous/anxious.       Objective:   Physical Exam Constitutional:      Appearance: He is obese.  HENT:     Head: Normocephalic.  Eyes:     Extraocular Movements: Extraocular movements intact.     Pupils: Pupils are equal, round, and reactive to light.  Cardiovascular:     Rate and Rhythm: Normal rate and regular  rhythm.     Pulses: Normal pulses.     Heart sounds: Normal heart sounds. No murmur heard.   No friction rub.  Pulmonary:     Effort: Pulmonary effort is normal. No respiratory distress.     Breath sounds: Normal breath sounds. No stridor. No wheezing or rhonchi.  Musculoskeletal:     Cervical back: No rigidity or tenderness.  Neurological:     General: No focal deficit present.     Mental Status: He is alert.   Vitals:   07/24/21 1629  BP: 110/64  Pulse: 70  SpO2: 98%   Results of the Epworth flowsheet 02/06/2020  Sitting and reading 0  Watching TV 0  Sitting, inactive in a public place (e.g. a theatre or a meeting) 0  As a passenger in a car for an hour without a break 0  Lying down to rest in the afternoon when circumstances permit 0  Sitting and talking to someone 0  Sitting quietly after a lunch without alcohol 0  In a car, while stopped for a few minutes in traffic 3  Total score 3   Excellent compliance with CPAP on a percent compliance, CPAP settings of 13 Residual AHI of 3.4    Assessment & Plan:  Chronic insomnia -Klonopin seem to help -We will increase dose to 2 mg  Underlying history of depression, bipolar disorder -Treatment optimized at present  has stopped Belsomra  Obstructive sleep apnea for which he is on CPAP -AHI of 3.4 -Encourage continuing CPAP compliance  Obesity -Has increased activity -Seen some weight  loss  Recommendation Increase Klonopin to 2 mg  Referral for cognitive behavioral therapy  Continue CPAP 13  I will see him back in the office in about 3 months  The risk of doubling up on his medications discussed, risk of overdosing on medications discussed  Encouraged to stay active  Encouraged to exercise regularly  Encouraged to call with any significant concerns

## 2021-07-24 NOTE — Patient Instructions (Addendum)
He was notified about his appt with [facility] on 08/08/2021 at 9:45am. [address], [place], [place] [address] [phone]

## 2021-07-24 NOTE — Patient Instructions (Signed)
Referral for cognitive behavioral therapy for insomnia  Increase Klonopin dose to 2 mg-prescription sent to pharmacy  Follow-up in 3 months

## 2021-08-12 ENCOUNTER — Telehealth: Payer: Self-pay | Admitting: Family Medicine

## 2021-08-12 NOTE — Telephone Encounter (Signed)
Pt called in complaining of feeling dizzy with an elevated bp of 138/85. He also was having a lot of back pain, I did schedule him an appointment for his back pain on 08/14/21.  For his complaints of being dizzy and his elevated bp I transferred him over to Nurse Triage.

## 2021-08-13 ENCOUNTER — Ambulatory Visit (INDEPENDENT_AMBULATORY_CARE_PROVIDER_SITE_OTHER): Payer: Managed Care, Other (non HMO) | Admitting: Family Medicine

## 2021-08-13 ENCOUNTER — Encounter: Payer: Self-pay | Admitting: Family Medicine

## 2021-08-13 ENCOUNTER — Other Ambulatory Visit: Payer: Self-pay

## 2021-08-13 VITALS — BP 122/80 | HR 78 | Temp 97.9°F | Wt 295.0 lb

## 2021-08-13 DIAGNOSIS — R42 Dizziness and giddiness: Secondary | ICD-10-CM

## 2021-08-13 HISTORY — DX: Dizziness and giddiness: R42

## 2021-08-13 LAB — CBC
HCT: 41.9 % (ref 39.0–52.0)
Hemoglobin: 14.1 g/dL (ref 13.0–17.0)
MCHC: 33.7 g/dL (ref 30.0–36.0)
MCV: 91.3 fl (ref 78.0–100.0)
Platelets: 274 10*3/uL (ref 150.0–400.0)
RBC: 4.59 Mil/uL (ref 4.22–5.81)
RDW: 14.1 % (ref 11.5–15.5)
WBC: 9.5 10*3/uL (ref 4.0–10.5)

## 2021-08-13 LAB — BASIC METABOLIC PANEL
BUN: 29 mg/dL — ABNORMAL HIGH (ref 6–23)
CO2: 26 mEq/L (ref 19–32)
Calcium: 9.9 mg/dL (ref 8.4–10.5)
Chloride: 103 mEq/L (ref 96–112)
Creatinine, Ser: 1.17 mg/dL (ref 0.40–1.50)
GFR: 66.83 mL/min (ref >60.00)
Glucose, Bld: 200 mg/dL — ABNORMAL HIGH (ref 70–99)
Potassium: 4.7 mEq/L (ref 3.5–5.1)
Sodium: 137 mEq/L (ref 135–145)

## 2021-08-13 NOTE — Progress Notes (Signed)
Established Patient Office Visit  Subjective:  Patient ID: Gary Grimes, male    DOB: 12-Feb-1961  Age: 60 y.o. MRN: [id number]  CC:  Chief Complaint  Patient presents with   Dizziness    Pt c/o excessive dizziness x 3days, more dizzy when he gets up too quickly.     HPI Gary Grimes presents for 1 day history of lightheadedness when standing.  It is also occurred when he is walk for any distance.  Denies shortness of breath chest pain or palpitations.  There is been no sensation of movement or spinning recently started meloxicam for degenerative disc disease in his cervical spine.  This was listed as a side effect he is concerned.  He takes the meloxicam at night.  Past Medical History:  Diagnosis Date   Achilles tendon contracture, left    Acquired equinus deformity of both feet 10/22/2019   ADHD    Anginal pain ([place])    ER visit 01/14/2014 visit on chart    Anxiety    pt denies   Arthritis    Bipolar 1 disorder ([place]) 01/15/2020   Bipolar disorder ([place])    Bipolar disorder, manic ([place]) 05/29/2016   Chronic a-fib ([place]) 06/06/2018   Chronic insomnia 08/01/2020   Chronic lower back pain    Chronic pansinusitis 04/28/2019   Constipation    Controlled type 2 diabetes mellitus, with long-term current use of insulin ([place]) 06/06/2018   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/19/2020   Depression    did get seen in er 4/13 for evaluation-psyc situational none recent   DOE (dyspnea on exertion) 11/29/2019   Essential hypertension 06/06/2018   GERD (gastroesophageal reflux disease)    Headache    none recent   High cholesterol    High triglycerides    HLD (hyperlipidemia) 01/15/2020   Hypertension    Increased body mass index 09/19/2020   Joint pain    Levator syndrome 2001   history    Lumbar pain 09/19/2008   Qualifier: Diagnosis of  By: Aline Brochure MD, [place]     Lumbar radiculopathy 09/19/2020   MDD (major depressive disorder), recurrent severe, without psychosis ([place])  12/27/2015   Mild CAD 05/17/2019   Neuropathy    OSA (obstructive sleep apnea) 01/15/2020   OSA on CPAP    Overweight 06/06/2018   Pain in thoracic spine 09/19/2008   Qualifier: Diagnosis of  By: Aline Brochure MD, [place]     Panic attacks    Perirectal abscess    Plantar fasciitis of left foot 02/28/2019   Prediabetes    Preoperative cardiovascular examination 05/17/2019   S/P ablation of atrial fibrillation    S/P nasal septoplasty 05/31/2019   Sleep apnea 06/06/2018   SPONDYLOSIS 09/19/2008   Qualifier: Diagnosis of  By: Aline Brochure MD, Stanley     Type 1 diabetes mellitus ([place]) 09/19/2020   Type II diabetes mellitus ([place])    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   Ventral incisional hernia 01/29/2015   Vitamin D deficiency    Work related injury 09/19/2020    Past Surgical History:  Procedure Laterality Date   ANAL FISSURE REPAIR  08/05/2000   proctoscopy   APPENDECTOMY  1984   ATRIAL FIBRILLATION ABLATION N/A 10/28/2018   Procedure: ATRIAL FIBRILLATION ABLATION;  Surgeon: Constance Haw, MD;  Location: [facility];  Service: Cardiovascular;  Laterality: N/A;   BIOPSY  05/24/2019   Procedure: BIOPSY;  Surgeon: Ronald Lobo, MD;  Location: [facility];  Service: Endoscopy;;   BIOPSY  08/10/2019   Procedure: BIOPSY;  Surgeon: Ronald Lobo, MD;  Location: [facility];  Service: Endoscopy;;   COLONOSCOPY  2011   COLONOSCOPY WITH PROPOFOL N/A 08/10/2019   Procedure: COLONOSCOPY WITH PROPOFOL;  Surgeon: Ronald Lobo, MD;  [facility];  Service: Endoscopy;  Laterality: N/A;   ESOPHAGOGASTRODUODENOSCOPY (EGD) WITH PROPOFOL N/A 05/24/2019   Procedure: ESOPHAGOGASTRODUODENOSCOPY (EGD) WITH PROPOFOL;  Surgeon: Ronald Lobo, MD;  [facility];  Service: Endoscopy;  Laterality: N/A;   GASTROCNEMIUS RECESSION Left 11/03/2019   Procedure: LEFT GASTROCNEMIUS RECESSION;  Surgeon: Newt Minion, MD;  Location: [facility];  Service:  Orthopedics;  Laterality: Left;   HERNIA REPAIR     INSERTION OF MESH N/A 01/29/2015   Procedure: INSERTION OF MESH;  Surgeon: Excell Seltzer, MD;  Location: [facility];  Service: General;  Laterality: N/A;   IRRIGATION AND DEBRIDEMENT ABSCESS  02/18/2012   peri-rectal   LEFT HEART CATH AND CORONARY ANGIOGRAPHY N/A 06/08/2018   Procedure: LEFT HEART CATH AND CORONARY ANGIOGRAPHY;  Surgeon: Leonie Man, MD;  Location: [facility];  Service: Cardiovascular;  Laterality: N/A;   LEFT HEART CATH AND CORONARY ANGIOGRAPHY N/A 10/18/2020   Procedure: LEFT HEART CATH AND CORONARY ANGIOGRAPHY;  Surgeon: Martinique, Peter M, MD;  Location: [facility];  Service: Cardiovascular;  Laterality: N/A;   NASAL SEPTOPLASTY W/ TURBINOPLASTY  05/31/2019   NASAL SEPTOPLASTY W/ TURBINOPLASTY Bilateral 05/31/2019   Procedure: NASAL SEPTOPLASTY WITH BILATERAL TURBINATE REDUCTION;  Surgeon: Leta Baptist, MD;  Location: [facility];  Service: ENT;  Laterality: Bilateral;   PLANTAR FASCIA RELEASE Left 11/03/2019   Procedure: PLANTAR FASCIA RELEASE LEFT FOOT;  Surgeon: Newt Minion, MD;  Location: [facility];  Service: Orthopedics;  Laterality: Left;   POLYPECTOMY  08/10/2019   Procedure: POLYPECTOMY;  Surgeon: Ronald Lobo, MD;  [facility];  Service: Endoscopy;;   SHOULDER ARTHROSCOPY Left ?2009   "repaired  [facility] joint; reattached bicept tendon"   SHOULDER ARTHROSCOPY W/ LABRAL REPAIR Left [id number]   UMBILICAL HERNIA REPAIR  10/27/2010   VENTRAL HERNIA REPAIR N/A 01/29/2015   Procedure: LAPAROSCOPIC VENTRAL HERNIA;  Surgeon: Excell Seltzer, MD;  Location: [facility];  Service: General;  Laterality: N/A;    Family History  Problem Relation Age of Onset   Breast cancer Mother    Ovarian cancer Mother    Diabetes Mother    Hypertension Mother    Hyperlipidemia Mother    Heart disease Mother    Sleep apnea Mother    Obesity Mother    Diabetes Father    Hypertension Father    Hyperlipidemia Father     Heart disease Father    Depression Father    Anxiety disorder Father    Bipolar disorder Father    Sleep apnea Father    Obesity Father    Diabetes Maternal Grandmother     Social History   Socioeconomic History   Marital status: Widowed    Spouse name: Not on file   Number of children: 3   Years of education: Not on file   Highest education level: Not on file  Occupational History   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    Comment: [employer]  Tobacco Use   Smoking status: Former    Packs/day: 0.50    Years: 1.00    Pack years: 0.50    Types: Cigarettes   Smokeless tobacco: Never   Tobacco  comments:    quit 1983  Vaping Use   Vaping Use: Never used  Substance and Sexual Activity   Alcohol use: Not Currently    Comment: rare wine   Drug use: Not Currently    Comment: not since 70'S   Sexual activity: Yes  Other Topics Concern   Not on file  Social History Narrative   Not on file   Social Determinants of Health   Financial Resource Strain: Not on file  Food Insecurity: Not on file  Transportation Needs: Not on file  Physical Activity: Not on file  Stress: Not on file  Social Connections: Not on file  Intimate Partner Violence: Not on file    Outpatient Medications Prior to Visit  Medication Sig Dispense Refill   aspirin EC 81 MG tablet Take 1 tablet (81 mg total) by mouth daily. Swallow whole. 90 tablet 3   atorvastatin (LIPITOR) 10 MG tablet TAKE 1 TABLET BY MOUTH EVERY DAY 90 tablet 3   carvedilol (COREG) 3.125 MG tablet Take 1 tablet (3.125 mg total) by mouth 2 (two) times daily with a meal. 180 tablet 3   clonazePAM (KLONOPIN) 2 MG tablet Take 1 tablet (2 mg total) by mouth at bedtime. 30 tablet 3   hydrOXYzine (ATARAX/VISTARIL) 25 MG tablet Take 25 mg by mouth at bedtime.     insulin NPH-regular Human (NOVOLIN 70/30) (70-30) 100 UNIT/ML injection 40 units with breakfast, and 30 with the evening meal. 70 mL 3    meloxicam (MOBIC) 7.5 MG tablet Take 7.5 mg by mouth daily.     methocarbamol (ROBAXIN) 750 MG tablet Take 750 mg by mouth daily as needed for muscle spasms.     nitroGLYCERIN (NITROSTAT) 0.4 MG SL tablet Place 0.4 mg under the tongue daily. Every 5 minutes for chest pain     olmesartan (BENICAR) 40 MG tablet Take 1 tablet (40 mg total) by mouth daily. 90 tablet 3   pregabalin (LYRICA) 200 MG capsule Take 1 capsule (200 mg total) by mouth 2 (two) times daily. 180 capsule 3   traZODone (DESYREL) 100 MG tablet      VRAYLAR 3 MG capsule Take 3 mg by mouth daily.     No facility-administered medications prior to visit.    Allergies  Allergen Reactions   Morphine Other (See Comments)    PT BECAME DELIRIOUS  PT BECAME DELIRIOUS  PT BECAME DELIRIOUS     ROS Review of Systems  Constitutional:  Negative for chills, diaphoresis, fatigue, fever and unexpected weight change.  HENT: Negative.    Respiratory: Negative.    Cardiovascular:  Negative for chest pain and palpitations.  Gastrointestinal: Negative.  Negative for abdominal pain, nausea and vomiting.  Musculoskeletal:  Positive for neck pain and neck stiffness.     Objective:    Physical Exam Vitals and nursing note reviewed.  Constitutional:      General: He is not in acute distress.    Appearance: Normal appearance. He is not ill-appearing, toxic-appearing or diaphoretic.  HENT:     Head: Normocephalic and atraumatic.     Right Ear: Tympanic membrane, ear canal and external ear normal.     Left Ear: Tympanic membrane, ear canal and external ear normal.     Mouth/Throat:     Mouth: Mucous membranes are moist.     Pharynx: Oropharynx is clear. No oropharyngeal exudate or posterior oropharyngeal erythema.  Eyes:     General: No scleral icterus.       Right  eye: No discharge.        Left eye: No discharge.     Extraocular Movements: Extraocular movements intact.     Conjunctiva/sclera: Conjunctivae normal.     Pupils: Pupils  are equal, round, and reactive to light.  Neck:     Vascular: No carotid bruit.  Cardiovascular:     Rate and Rhythm: Normal rate and regular rhythm.  Pulmonary:     Effort: Pulmonary effort is normal.     Breath sounds: Normal breath sounds.  Abdominal:     General: Bowel sounds are normal.  Musculoskeletal:     Cervical back: No rigidity or tenderness.  Lymphadenopathy:     Cervical: No cervical adenopathy.  Skin:    General: Skin is warm and dry.  Neurological:     Mental Status: He is alert and oriented to person, place, and time.    BP 122/80   Pulse 78   Temp 97.9 F (36.6 C) (Temporal)   Wt 295 lb (133.8 kg)   SpO2 94%   BMI 41.14 kg/m  Wt Readings from Last 3 Encounters:  08/13/21 295 lb (133.8 kg)  07/24/21 294 lb (133.4 kg)  07/24/21 290 lb 6.4 oz (131.7 kg)     Health Maintenance Due  Topic Date Due   Zoster Vaccines- Shingrix (1 of 2) Never done   Pneumococcal Vaccine 37-4 Years old (2 - PCV) 10/30/2019   COVID-19 Vaccine (3 - Pfizer risk series) 05/15/2020   INFLUENZA VACCINE  07/28/2021    There are no preventive care reminders to display for this patient.  Lab Results  Component Value Date   TSH 1.450 08/06/2020   Lab Results  Component Value Date   WBC 8.1 03/11/2021   HGB 13.3 03/11/2021   HCT 38.9 (L) 03/11/2021   MCV 92.4 03/11/2021   PLT 225 03/11/2021   Lab Results  Component Value Date   NA 140 07/14/2021   K 3.7 07/14/2021   CO2 29 07/14/2021   GLUCOSE 268 (H) 07/14/2021   BUN 14 07/14/2021   CREATININE 1.02 07/14/2021   BILITOT 1.3 (H) 03/14/2021   ALKPHOS 72 03/14/2021   AST 28 03/14/2021   ALT 31 03/14/2021   PROT 7.6 03/14/2021   ALBUMIN 4.5 03/14/2021   CALCIUM 9.3 07/14/2021   ANIONGAP 11 03/14/2021   GFR 78.83 07/14/2021   Lab Results  Component Value Date   CHOL 112 07/14/2021   Lab Results  Component Value Date   HDL 41.40 07/14/2021   Lab Results  Component Value Date   LDLCALC 36 07/14/2021   Lab  Results  Component Value Date   TRIG 170.0 (H) 07/14/2021   Lab Results  Component Value Date   CHOLHDL 3 07/14/2021   Lab Results  Component Value Date   HGBA1C 8.1 Repeated and verified X2. (H) 07/14/2021      Assessment & Plan:   Problem List Items Addressed This Visit       Other   Lightheadedness - Primary   Relevant Orders   Basic metabolic panel   CBC   Urinalysis, Routine w reflex microscopic    No orders of the defined types were placed in this encounter.   Follow-up: Return will let you know results tomorrow. Drink more fluids in the meantime..  Explained that lightheadedness is a very rare side effect of meloxicam.  Symptoms over more likely due to dehydration.  We will check UA, BMP and CBC.  Libby Maw, MD

## 2021-08-14 ENCOUNTER — Ambulatory Visit: Payer: Managed Care, Other (non HMO) | Admitting: Family Medicine

## 2021-08-14 LAB — URINALYSIS, ROUTINE W REFLEX MICROSCOPIC
Bilirubin Urine: NEGATIVE
Hgb urine dipstick: NEGATIVE
Leukocytes,Ua: NEGATIVE
Nitrite: NEGATIVE
RBC / HPF: NONE SEEN (ref 0–?)
Specific Gravity, Urine: 1.02 (ref 1.000–1.030)
Total Protein, Urine: NEGATIVE
Urine Glucose: 250 — AB
Urobilinogen, UA: 0.2 (ref 0.0–1.0)
WBC, UA: NONE SEEN (ref 0–?)
pH: 6 (ref 5.0–8.0)

## 2021-08-18 ENCOUNTER — Ambulatory Visit: Payer: Managed Care, Other (non HMO) | Admitting: Orthopedic Surgery

## 2021-08-28 ENCOUNTER — Ambulatory Visit: Payer: Managed Care, Other (non HMO) | Admitting: Orthopedic Surgery

## 2021-09-05 ENCOUNTER — Ambulatory Visit: Payer: Managed Care, Other (non HMO) | Admitting: Endocrinology

## 2021-10-01 ENCOUNTER — Other Ambulatory Visit: Payer: Self-pay | Admitting: Pulmonary Disease

## 2021-10-01 ENCOUNTER — Telehealth: Payer: Self-pay | Admitting: Pulmonary Disease

## 2021-10-01 MED ORDER — QUVIVIQ 25 MG PO TABS: 25.0000 mg | ORAL_TABLET | Freq: Every day | ORAL | 2 refills | Status: DC

## 2021-10-01 NOTE — Telephone Encounter (Signed)
Called patient but he did not answer. Left message for him to call back.  

## 2021-10-01 NOTE — Telephone Encounter (Signed)
Order placed  May need specialist pharmacy

## 2021-10-01 NOTE — Progress Notes (Signed)
Order for Quvivq placed  May need specialist pharmacy though

## 2021-10-01 NOTE — Telephone Encounter (Signed)
Called and spoke with pt who states he wants to switch to new medication Quvivq and stop his current sleep medication that he is on.  Dr. Jenetta Downer, please advise.

## 2021-10-02 ENCOUNTER — Telehealth: Payer: Self-pay | Admitting: *Deleted

## 2021-10-02 NOTE — Telephone Encounter (Signed)
Hold in Triage until determination, 5 days from 10/02/21 (10/13).

## 2021-10-02 NOTE — Telephone Encounter (Signed)
Patient returned call, advised patient that Dr. Ander Slade [facility] in [place] [facility] [address].  I advised him that if [facility] not [facility] that he may need a speciality pharmacy and that information may be on his insurance card and if not he would need to contact his insurance company.  He verbalized understanding.  Nothing further needed.

## 2021-10-02 NOTE — Telephone Encounter (Signed)
Patient called back to say he called [facility] and was told that Dr. Ander Slade had to approve the new medication, Quviviq 25 mg.  He recently started a new job on Saturday.  We do not have his current insurance on file.  Advised I would call [facility] and get the updated information for the PA.  He does not have an upcoming appointment.  I let him know that if he is in the area, he can come by and have his new insurance card scanned in to our system or he can just bring the new card when he comes for his next visit.  He verbalized understanding.  I called [facility] and received the information needed to do a PA.  He now had [facility] BIN:  [id number], PCN:  [id number], ID:  [id number], Group #:  [id number].  PA request was received from [facility] Phone:[phone] Fax: [phone] Medication name and strength: Quviviq 25 mg Ordering Provider: Dr. Ander Slade  Was PA started with [facility]?: yes If yes, please enter KEY: [id number] Medication tried and failed: Trazodone, Belsomra, Ambien, Ambien CR, Hydroxyzine, Ativan Covered Alternatives: unknown  PA sent to plan, time frame for approval / denial: 5 business days Routing to triage for follow-up

## 2021-10-06 ENCOUNTER — Ambulatory Visit: Payer: Self-pay | Admitting: Endocrinology

## 2021-10-06 NOTE — Telephone Encounter (Signed)
Called the number provided and placed on long hold  Checked CMM and KEY would not pull up the pt  Called and left him detailed msg that this PA was submitted and can take up to 5 days for approval/denial  Will check back on 10/13 if nothing received by then

## 2021-10-07 ENCOUNTER — Other Ambulatory Visit: Payer: Self-pay

## 2021-10-07 ENCOUNTER — Ambulatory Visit: Payer: Self-pay | Admitting: Endocrinology

## 2021-10-07 VITALS — BP 150/74 | HR 74 | Ht 71.0 in | Wt 300.4 lb

## 2021-10-07 DIAGNOSIS — E119 Type 2 diabetes mellitus without complications: Secondary | ICD-10-CM | POA: Diagnosis not present

## 2021-10-07 LAB — POCT GLYCOSYLATED HEMOGLOBIN (HGB A1C): Hemoglobin A1C: 9.1 % — AB (ref 4.0–5.6)

## 2021-10-07 MED ORDER — NOVOLIN 70/30 (70-30) 100 UNIT/ML ~~LOC~~ SUSP: SUBCUTANEOUS | 3 refills | Status: DC

## 2021-10-07 NOTE — Progress Notes (Signed)
Subjective:    Patient ID: Gary Grimes, male    DOB: 1961/03/28, 60 y.o.   MRN: [id number]  HPI Pt returns for f/u of diabetes mellitus: DM type: Insulin-requiring type 2 Dx'ed: 2637 Complications: PN, GP, and CAD Therapy: insulin since dx DKA: never Severe hypoglycemia: never Pancreatitis: poss mild 3/22. Ozempic was d/c'ed. Pancreatic imaging: normal on 2022 CT SDOH: nnoe Other: He declines multiple daily injections; He did not tolerate Jardiance (dehydration) or metformin (abd pain) Interval history: no cbg record, but states cbg's vary from 167-380.  It is in general higher as the day goes on.  Pt says he never misses the insulin.   Past Medical History:  Diagnosis Date   Achilles tendon contracture, left    Acquired equinus deformity of both feet 10/22/2019   ADHD    Anginal pain ([place])    ER visit 01/14/2014 visit on chart    Anxiety    pt denies   Arthritis    Bipolar 1 disorder ([place]) 01/15/2020   Bipolar disorder ([place])    Bipolar disorder, manic ([place]) 05/29/2016   Chronic a-fib ([place]) 06/06/2018   Chronic insomnia 08/01/2020   Chronic lower back pain    Chronic pansinusitis 04/28/2019   Constipation    Controlled type 2 diabetes mellitus, with long-term current use of insulin ([place]) 06/06/2018   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/19/2020   Depression    did get seen in er 4/13 for evaluation-psyc situational none recent   DOE (dyspnea on exertion) 11/29/2019   Essential hypertension 06/06/2018   GERD (gastroesophageal reflux disease)    Headache    none recent   High cholesterol    High triglycerides    HLD (hyperlipidemia) 01/15/2020   Hypertension    Increased body mass index 09/19/2020   Joint pain    Levator syndrome 2001   history    Lumbar pain 09/19/2008   Qualifier: Diagnosis of  By: Aline Brochure MD, [place]     Lumbar radiculopathy 09/19/2020   MDD (major depressive disorder), recurrent severe, 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   Mild CAD 05/17/2019    Neuropathy    OSA (obstructive sleep apnea) 01/15/2020   OSA on CPAP    Overweight 06/06/2018   Pain in thoracic spine 09/19/2008   Qualifier: Diagnosis of  By: Aline Brochure MD, Stanley     Panic attacks    Perirectal abscess    Plantar fasciitis of left foot 02/28/2019   Prediabetes    Preoperative cardiovascular examination 05/17/2019   S/P ablation of atrial fibrillation    S/P nasal septoplasty 05/31/2019   Sleep apnea 06/06/2018   SPONDYLOSIS 09/19/2008   Qualifier: Diagnosis of  By: Aline Brochure MD, Stanley     Type 1 diabetes mellitus ([place]) 09/19/2020   Type II diabetes mellitus ([place])    Type II diabetes mellitus with neurological manifestations ([place]) 02/28/2019   Ventral incisional hernia 01/29/2015   Vitamin D deficiency    Work related injury 09/19/2020    Past Surgical History:  Procedure Laterality Date   ANAL FISSURE REPAIR  08/05/2000   proctoscopy   APPENDECTOMY  1984   ATRIAL FIBRILLATION ABLATION N/A 10/28/2018   Procedure: ATRIAL FIBRILLATION ABLATION;  Surgeon: Constance Haw, MD;  Location: [facility];  Service: Cardiovascular;  Laterality: N/A;   BIOPSY  05/24/2019   Procedure: BIOPSY;  Surgeon: Ronald Lobo, MD;  Location: [facility];  Service: Endoscopy;;   BIOPSY  08/10/2019   Procedure: BIOPSY;  Surgeon: Ronald Lobo,  MD;  Location: [facility];  Service: Endoscopy;;   COLONOSCOPY  2011   COLONOSCOPY WITH PROPOFOL N/A 08/10/2019   Procedure: COLONOSCOPY WITH PROPOFOL;  Surgeon: Ronald Lobo, MD;  [facility]: [facility];  Service: Endoscopy;  Laterality: N/A;   ESOPHAGOGASTRODUODENOSCOPY (EGD) WITH PROPOFOL N/A 05/24/2019   Procedure: ESOPHAGOGASTRODUODENOSCOPY (EGD) WITH PROPOFOL;  Surgeon: Ronald Lobo, MD;  [facility];  Service: Endoscopy;  Laterality: N/A;   GASTROCNEMIUS RECESSION Left 11/03/2019   Procedure: LEFT GASTROCNEMIUS RECESSION;  Surgeon: Newt Minion, MD;  Location: [facility];  Service: Orthopedics;  Laterality: Left;    HERNIA REPAIR     INSERTION OF MESH N/A 01/29/2015   Procedure: INSERTION OF MESH;  Surgeon: Excell Seltzer, MD;  Location: [facility];  Service: General;  Laterality: N/A;   IRRIGATION AND DEBRIDEMENT ABSCESS  02/18/2012   peri-rectal   LEFT HEART CATH AND CORONARY ANGIOGRAPHY N/A 06/08/2018   Procedure: LEFT HEART CATH AND CORONARY ANGIOGRAPHY;  Surgeon: Leonie Man, MD;  Location: [facility];  Service: Cardiovascular;  Laterality: N/A;   LEFT HEART CATH AND CORONARY ANGIOGRAPHY N/A 10/18/2020   Procedure: LEFT HEART CATH AND CORONARY ANGIOGRAPHY;  Surgeon: Martinique, Peter M, MD;  Location: [facility];  Service: Cardiovascular;  Laterality: N/A;   NASAL SEPTOPLASTY W/ TURBINOPLASTY  05/31/2019   NASAL SEPTOPLASTY W/ TURBINOPLASTY Bilateral 05/31/2019   Procedure: NASAL SEPTOPLASTY WITH BILATERAL TURBINATE REDUCTION;  Surgeon: Leta Baptist, MD;  Location: [facility];  Service: ENT;  Laterality: Bilateral;   PLANTAR FASCIA RELEASE Left 11/03/2019   Procedure: PLANTAR FASCIA RELEASE LEFT FOOT;  Surgeon: Newt Minion, MD;  Location: [facility];  Service: Orthopedics;  Laterality: Left;   POLYPECTOMY  08/10/2019   Procedure: POLYPECTOMY;  Surgeon: Ronald Lobo, MD;  [facility];  Service: Endoscopy;;   SHOULDER ARTHROSCOPY Left ?2009   "repaired  [facility] joint; reattached bicept tendon"   SHOULDER ARTHROSCOPY W/ LABRAL REPAIR Left 08/67/6195   UMBILICAL HERNIA REPAIR  10/27/2010   VENTRAL HERNIA REPAIR N/A 01/29/2015   Procedure: LAPAROSCOPIC VENTRAL HERNIA;  Surgeon: Excell Seltzer, MD;  Location: [facility];  Service: General;  Laterality: N/A;    Social History   Socioeconomic History   Marital status: Widowed    Spouse name: Not on file   Number of children: 3   Years of education: Not on file   Highest education level: Not on file  Occupational History   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    Comment: [place]  Tobacco  Use   Smoking status: Former    Packs/day: 0.50    Years: 1.00    Pack years: 0.50    Types: Cigarettes   Smokeless tobacco: Never   Tobacco comments:    quit 1983  Vaping Use   Vaping Use: Never used  Substance and Sexual Activity   Alcohol use: Not Currently    Comment: rare wine   Drug use: Not Currently    Comment: not since 70'S   Sexual activity: Yes  Other Topics Concern   Not on file  Social History Narrative   Not on file   Social Determinants of Health   Financial Resource Strain: Not on file  Food Insecurity: Not on file  Transportation Needs: Not on file  Physical Activity: Not on file  Stress: Not on file  Social Connections: Not on file  Intimate Partner Violence: Not on file    Current Outpatient Medications on File Prior  to Visit  Medication Sig Dispense Refill   aspirin EC 81 MG tablet Take 1 tablet (81 mg total) by mouth daily. Swallow whole. 90 tablet 3   atorvastatin (LIPITOR) 10 MG tablet TAKE 1 TABLET BY MOUTH EVERY DAY 90 tablet 3   carvedilol (COREG) 3.125 MG tablet Take 1 tablet (3.125 mg total) by mouth 2 (two) times daily with a meal. 180 tablet 3   clonazePAM (KLONOPIN) 2 MG tablet Take 1 tablet (2 mg total) by mouth at bedtime. 30 tablet 3   Daridorexant HCl (QUVIVIQ) 25 MG TABS Take 25 mg by mouth at bedtime. 30 tablet 2   hydrOXYzine (ATARAX/VISTARIL) 25 MG tablet Take 25 mg by mouth at bedtime.     meloxicam (MOBIC) 7.5 MG tablet Take 7.5 mg by mouth daily.     methocarbamol (ROBAXIN) 750 MG tablet Take 750 mg by mouth daily as needed for muscle spasms.     nitroGLYCERIN (NITROSTAT) 0.4 MG SL tablet Place 0.4 mg under the tongue daily. Every 5 minutes for chest pain     olmesartan (BENICAR) 40 MG tablet Take 1 tablet (40 mg total) by mouth daily. 90 tablet 3   pregabalin (LYRICA) 200 MG capsule Take 1 capsule (200 mg total) by mouth 2 (two) times daily. 180 capsule 3   traZODone (DESYREL) 100 MG tablet      VRAYLAR 3 MG capsule Take 3 mg  by mouth daily.     No current facility-administered medications on file prior to visit.    Allergies  Allergen Reactions   Morphine Other (See Comments)    PT BECAME DELIRIOUS  PT BECAME DELIRIOUS  PT BECAME DELIRIOUS     Family History  Problem Relation Age of Onset   Breast cancer Mother    Ovarian cancer Mother    Diabetes Mother    Hypertension Mother    Hyperlipidemia Mother    Heart disease Mother    Sleep apnea Mother    Obesity Mother    Diabetes Father    Hypertension Father    Hyperlipidemia Father    Heart disease Father    Depression Father    Anxiety disorder Father    Bipolar disorder Father    Sleep apnea Father    Obesity Father    Diabetes Maternal Grandmother     BP (!) 150/74 (BP Location: Right Arm, Patient Position: Sitting, Cuff Size: Large)   Pulse 74   Ht 5\' 11"  (1.803 m)   Wt (!) 300 lb 6.4 oz (136.3 kg)   SpO2 95%   BMI 41.90 kg/m   Review of Systems He denies hypoglycemia    Objective:   Physical Exam VITAL SIGNS:  See vs page GENERAL: no distress.     Lab Results  Component Value Date   HGBA1C 9.1 (A) 10/07/2021      Assessment & Plan:  Insulin-requiring type 2 DM: uncontrolled  Patient Instructions  Your blood pressure is high today.  Please see your primary care provider soon, to have it rechecked. check your blood sugar twice a day.  vary the time of day when you check, between before the 3 meals, and at bedtime.  also check if you have symptoms of your blood sugar being too high or too low.  please keep a record of the readings and bring it to your next appointment here (or you can bring the meter itself).  You can write it on any piece of paper.  please call us sooner if  your blood sugar goes below 70, or if most of your readings are over 200.  Please increase the insulin to 55 units with breakfast, and 30 with the evening meal.  On this type of insulin schedule, you should eat meals on a regular schedule.  If a meal is  missed or significantly delayed, your blood sugar could go low.    Please come back for a follow-up appointment in 3 months.

## 2021-10-07 NOTE — Patient Instructions (Addendum)
Your blood pressure is high today.  Please see your primary care provider soon, to have it rechecked. check your blood sugar twice a day.  vary the time of day when you check, between before the 3 meals, and at bedtime.  also check if you have symptoms of your blood sugar being too high or too low.  please keep a record of the readings and bring it to your next appointment here (or you can bring the meter itself).  You can write it on any piece of paper.  please call us sooner if your blood sugar goes below 70, or if most of your readings are over 200.  Please increase the insulin to 55 units with breakfast, and 30 with the evening meal.  On this type of insulin schedule, you should eat meals on a regular schedule.  If a meal is missed or significantly delayed, your blood sugar could go low.    Please come back for a follow-up appointment in 3 months.

## 2021-10-09 NOTE — Telephone Encounter (Signed)
Spoke with patient, advised that we are awaiting a fax regarding the outcome of the PA from his insurance company and that I called his insurance company this morning and spent 24 minutes on hold and never got to speak with anyone.  I advised him that as soon as we have the outcome we will give him a call.  He verbalized understanding.

## 2021-10-09 NOTE — Telephone Encounter (Addendum)
[employer] The plan will fax you a determination, typically within 1 to 5 business days.  Unable to check status online.  I checked Dr. Judson Roch box, no fax located with determination.  To follow up, call the plan at [phone]  Called and spoke with Tiffany who stated she would have to transfer me to [place] to get the PA outcome.    I was on hold for 25 minutes and hung up.  Will await fax.

## 2021-10-09 NOTE — Telephone Encounter (Signed)
Nothing received yet (checked up front and AO's cabinet)  I tried looking at this in [facility] and key will not work for me  I called [facility] and had them try and run rx to see if it goes through  Per pharmacist it will still not go through  [place], please try key in your cmm to see if you can access this since you initiated, thanks

## 2021-10-16 ENCOUNTER — Other Ambulatory Visit: Payer: Self-pay

## 2021-10-17 ENCOUNTER — Ambulatory Visit: Payer: Managed Care, Other (non HMO) | Admitting: Family Medicine

## 2021-10-17 VITALS — BP 126/70 | HR 63 | Temp 97.4°F | Ht 71.0 in | Wt 305.6 lb

## 2021-10-17 DIAGNOSIS — C44321 Squamous cell carcinoma of skin of nose: Secondary | ICD-10-CM

## 2021-10-17 DIAGNOSIS — E1142 Type 2 diabetes mellitus with diabetic polyneuropathy: Secondary | ICD-10-CM | POA: Diagnosis not present

## 2021-10-17 DIAGNOSIS — F5104 Psychophysiologic insomnia: Secondary | ICD-10-CM

## 2021-10-17 DIAGNOSIS — E1140 Type 2 diabetes mellitus with diabetic neuropathy, unspecified: Secondary | ICD-10-CM

## 2021-10-17 DIAGNOSIS — R413 Other amnesia: Secondary | ICD-10-CM

## 2021-10-17 DIAGNOSIS — E782 Mixed hyperlipidemia: Secondary | ICD-10-CM

## 2021-10-17 DIAGNOSIS — I25118 Atherosclerotic heart disease of native coronary artery with other forms of angina pectoris: Secondary | ICD-10-CM | POA: Diagnosis not present

## 2021-10-17 DIAGNOSIS — I10 Essential (primary) hypertension: Secondary | ICD-10-CM

## 2021-10-17 DIAGNOSIS — Z6841 Body Mass Index (BMI) 40.0 and over, adult: Secondary | ICD-10-CM

## 2021-10-17 HISTORY — DX: Squamous cell carcinoma of skin of nose: C44.321

## 2021-10-17 MED ORDER — ASPIRIN EC 81 MG PO TBEC: 81.0000 mg | DELAYED_RELEASE_TABLET | Freq: Every day | ORAL | 3 refills | Status: AC

## 2021-10-17 MED ORDER — PREGABALIN 200 MG PO CAPS: 200.0000 mg | ORAL_CAPSULE | Freq: Two times a day (BID) | ORAL | 3 refills | Status: DC

## 2021-10-17 MED ORDER — TRAZODONE HCL 100 MG PO TABS: 100.0000 mg | ORAL_TABLET | Freq: Every day | ORAL | 3 refills | Status: DC

## 2021-10-17 MED ORDER — CARVEDILOL 3.125 MG PO TABS: 3.1250 mg | ORAL_TABLET | Freq: Two times a day (BID) | ORAL | 3 refills | Status: DC
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
## 2021-10-17 NOTE — Telephone Encounter (Signed)
Patient called in to check status of PA for Quviviq 25 mg.  No paperwork (fax) received from his insurance indicating whether the PA had been approved or denied.  I called [facility] to see if they have been able to fill the script and I was told by [place] that they have not been able to get it to go through.  I advised the patient that we have received no fax from his insurance company and that [facility] has not been able to get the script to go through.  I advised him to call his insurance company to get the outcome as I was on hold for 24 minutes the last time I called and was still unable to speak with anyone.  He verbalized understanding and stated he would call the insurance company.

## 2021-10-21 ENCOUNTER — Telehealth: Payer: Self-pay | Admitting: Pharmacy Technician

## 2021-10-21 NOTE — Telephone Encounter (Signed)
Patient Advocate Encounter  Received notification from [place] that prior authorization for QUVIVIQ 25MG  is required.   PA submitted on 10.25.22 Key [id number] Status is pending   [facility] will continue to follow  Luciano Cutter, CPhT Patient Advocate Phone: [phone] Fax:  [phone]

## 2021-10-22 ENCOUNTER — Encounter: Payer: Self-pay | Admitting: Physician Assistant

## 2021-10-28 ENCOUNTER — Other Ambulatory Visit: Payer: Self-pay

## 2021-10-28 ENCOUNTER — Ambulatory Visit: Payer: Managed Care, Other (non HMO) | Admitting: Cardiology

## 2021-10-28 VITALS — BP 108/58 | HR 62 | Ht 72.0 in | Wt 300.4 lb

## 2021-10-28 DIAGNOSIS — I10 Essential (primary) hypertension: Secondary | ICD-10-CM | POA: Diagnosis not present

## 2021-10-28 DIAGNOSIS — I2510 Atherosclerotic heart disease of native coronary artery without angina pectoris: Secondary | ICD-10-CM

## 2021-10-28 DIAGNOSIS — Z6841 Body Mass Index (BMI) 40.0 and over, adult: Secondary | ICD-10-CM

## 2021-10-28 DIAGNOSIS — E782 Mixed hyperlipidemia: Secondary | ICD-10-CM | POA: Diagnosis not present

## 2021-10-28 DIAGNOSIS — Z9989 Dependence on other enabling machines and devices: Secondary | ICD-10-CM

## 2021-10-28 DIAGNOSIS — G4733 Obstructive sleep apnea (adult) (pediatric): Secondary | ICD-10-CM

## 2021-10-28 MED ORDER — ATORVASTATIN CALCIUM 10 MG PO TABS: 10.0000 mg | ORAL_TABLET | Freq: Every day | ORAL | 3 refills | Status: DC
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
## 2021-10-28 NOTE — Patient Instructions (Signed)

## 2021-10-29 ENCOUNTER — Other Ambulatory Visit (HOSPITAL_COMMUNITY): Payer: Self-pay

## 2021-10-29 NOTE — Telephone Encounter (Signed)
AO, can you please advise? Thanks!

## 2021-10-29 NOTE — Telephone Encounter (Signed)
Patient Advocate Encounter  Received notification from Gary Grimes that the request for prior authorization for Gary Grimes has been denied due to not meeting criteria.    Pt must have and inadequate response, contraindication, or intolerance to at least 3 of the following: Dayvigo (req PA), Doxepin ($10), eszopiclone ($10) (Lunesta), ramelteon ($10) (Rozerem), and zaleplon ($10) (sonata).  I could only find that the pt has taken lunesta.    Gary Grimes, [facility] Patient [facility] Phone: [phone] Fax:  [phone]

## 2021-10-30 ENCOUNTER — Other Ambulatory Visit: Payer: Self-pay | Admitting: Pulmonary Disease

## 2021-10-30 MED ORDER — ZALEPLON 10 MG PO CAPS: 10.0000 mg | ORAL_CAPSULE | Freq: Every evening | ORAL | 2 refills | Status: DC | PRN

## 2021-10-30 NOTE — Telephone Encounter (Signed)
Called patient but he did not answer. Left message for him to call us back tomorrow morning.  

## 2021-10-30 NOTE — Telephone Encounter (Signed)
Prescription for [facility] sent in place of [facility]

## 2021-10-30 NOTE — Progress Notes (Signed)
Prescription for [facility] sent into pharmacy in place of [place]

## 2021-10-31 ENCOUNTER — Telehealth: Payer: Self-pay | Admitting: Pulmonary Disease

## 2021-10-31 NOTE — Telephone Encounter (Signed)
Please see 10/21/2021 phone note.   Patient is aware of change in medications. He is questioning if he should continue Klonopin with Sonata.  Dr. Ander Slade, please advise. thanks

## 2021-10-31 NOTE — Telephone Encounter (Signed)
Will discourage using both of them together  effect may be very cumulative  You can continue with whichever one helps you the best

## 2021-11-03 ENCOUNTER — Ambulatory Visit: Payer: Managed Care, Other (non HMO) | Admitting: Physician Assistant

## 2021-11-03 NOTE — Telephone Encounter (Signed)
Spoke with patient and told him that Dr. Ander Slade is discouraging patient using both medications together and he is asking about stopping the Klonopin and how he should do that. Does he need to taper off of it or can he quit it cold [place] etc?   Dr. Ander Slade please advise

## 2021-11-03 NOTE — Telephone Encounter (Signed)
Called and spoke with patient to let him know the recs from Dr. Ander Slade. He expressed understanding. Nothing further needed at this time.

## 2021-11-03 NOTE — Telephone Encounter (Signed)
LMTCB

## 2021-11-03 NOTE — Telephone Encounter (Signed)
Probably best to taper over a week

## 2021-11-05 ENCOUNTER — Ambulatory Visit: Payer: Managed Care, Other (non HMO) | Admitting: Physician Assistant

## 2021-11-05 ENCOUNTER — Encounter: Payer: Self-pay | Admitting: Physician Assistant

## 2021-11-05 ENCOUNTER — Other Ambulatory Visit (INDEPENDENT_AMBULATORY_CARE_PROVIDER_SITE_OTHER): Payer: Managed Care, Other (non HMO)

## 2021-11-05 ENCOUNTER — Other Ambulatory Visit: Payer: Self-pay

## 2021-11-05 VITALS — BP 135/78 | HR 77 | Resp 18 | Ht 72.0 in | Wt 298.0 lb

## 2021-11-05 DIAGNOSIS — R413 Other amnesia: Secondary | ICD-10-CM

## 2021-11-05 LAB — VITAMIN B12: Vitamin B-12: 672 pg/mL (ref 211–911)

## 2021-11-05 LAB — TSH: TSH: 0.52 u[IU]/mL (ref 0.35–5.50)

## 2021-11-05 NOTE — Patient Instructions (Signed)
It was a pleasure to see you today at our office.   Recommendations:  Neurocognitive evaluation at our office MRI of the brain, the radiology office will call you to arrange you appointment Check labs today Consider Cognitive Behavioral Therapy  Follow up in 1 month  RECOMMENDATIONS FOR ALL PATIENTS WITH MEMORY PROBLEMS: 1. Continue to exercise (Recommend 30 minutes of walking everyday, or 3 hours every week) 2. Increase social interactions - continue going to [place] and enjoy social gatherings with friends and family 3. Eat healthy, avoid fried foods and eat more fruits and vegetables 4. Maintain adequate blood pressure, blood sugar, and blood cholesterol level. Reducing the risk of stroke and cardiovascular disease also helps promoting better memory. 5. Avoid stressful situations. Live a simple life and avoid aggravations. Organize your time and prepare for the next day in anticipation. 6. Sleep well, avoid any interruptions of sleep and avoid any distractions in the bedroom that may interfere with adequate sleep quality 7. Avoid sugar, avoid sweets as there is a strong link between excessive sugar intake, diabetes, and cognitive impairment We discussed the Mediterranean diet, which has been shown to help patients reduce the risk of progressive memory disorders and reduces cardiovascular risk. This includes eating fish, eat fruits and green leafy vegetables, nuts like almonds and hazelnuts, walnuts, and also use olive oil. Avoid fast foods and fried foods as much as possible. Avoid sweets and sugar as sugar use has been linked to worsening of memory function.  There is always a concern of gradual progression of memory problems. If this is the case, then we may need to adjust level of care according to patient needs. Support, both to the patient and caregiver, should then be put into place.      You have been referred for a neuropsychological evaluation (i.e., evaluation of memory and  thinking abilities). Please bring someone with you to this appointment if possible, as it is helpful for the doctor to hear from both you and another adult who knows you well. Please bring eyeglasses and hearing aids if you wear them.    The evaluation will take approximately 3 hours and has two parts:   The first part is a clinical interview with the neuropsychologist (Dr. Melvyn Novas or Dr. Nicole Kindred). During the interview, the neuropsychologist will speak with you and the individual you brought to the appointment.    The second part of the evaluation is testing with the doctor's technician Hinton Dyer or Maudie Mercury). During the testing, the technician will ask you to remember different types of material, solve problems, and answer some questionnaires. Your family member will not be present for this portion of the evaluation.   Please note: We must reserve several hours of the neuropsychologist's time and the psychometrician's time for your evaluation appointment. As such, there is a No-Show fee of $100. If you are unable to attend any of your appointments, please contact our office as soon as possible to reschedule.    FALL PRECAUTIONS: Be cautious when walking. Scan the area for obstacles that may increase the risk of trips and falls. When getting up in the mornings, sit up at the edge of the bed for a few minutes before getting out of bed. Consider elevating the bed at the head end to avoid drop of blood pressure when getting up. Walk always in a well-lit room (use night lights in the walls). Avoid area rugs or power cords from appliances in the middle of the walkways. Use a walker or  a cane if necessary and consider physical therapy for balance exercise. Get your eyesight checked regularly.  FINANCIAL OVERSIGHT: Supervision, especially oversight when making financial decisions or transactions is also recommended.  HOME SAFETY: Consider the safety of the kitchen when operating appliances like stoves, microwave oven,  and blender. Consider having supervision and share cooking responsibilities until no longer able to participate in those. Accidents with firearms and other hazards in the house should be identified and addressed as well.   ABILITY TO BE LEFT ALONE: If patient is unable to contact 911 operator, consider using LifeLine, or when the need is there, arrange for someone to stay with patients. Smoking is a fire hazard, consider supervision or cessation. Risk of wandering should be assessed by caregiver and if detected at any point, supervision and safe proof recommendations should be instituted.  MEDICATION SUPERVISION: Inability to self-administer medication needs to be constantly addressed. Implement a mechanism to ensure safe administration of the medications.   DRIVING: Regarding driving, in patients with progressive memory problems, driving will be impaired. We advise to have someone else do the driving if trouble finding directions or if minor accidents are reported. Independent driving assessment is available to determine safety of driving.   If you are interested in the driving assessment, you can contact the following:  [employer] in [place]  [place] [phone] or [phone]    [place] refers to food and lifestyle choices that are based on the traditions of countries located on the [employer]. This way of eating has been shown to help prevent certain conditions and improve outcomes for people who have chronic diseases, like kidney disease and heart disease. What are tips for following this plan? Lifestyle  Cook and eat meals together with your family, when possible. Drink enough fluid to keep your urine clear or pale yellow. Be physically active every day. This includes: Aerobic exercise like running or swimming. Leisure  activities like gardening, walking, or housework. Get 7-8 hours of sleep each night. If recommended by your health care provider, drink red wine in moderation. This means 1 glass a day for nonpregnant women and 2 glasses a day for men. A glass of wine equals 5 oz (150 mL). Reading food labels  Check the serving size of packaged foods. For foods such as rice and pasta, the serving size refers to the amount of cooked product, not dry. Check the total fat in packaged foods. Avoid foods that have saturated fat or trans fats. Check the ingredients list for added sugars, such as corn syrup. Shopping  At the grocery store, buy most of your food from the areas near the walls of the store. This includes: Fresh fruits and vegetables (produce). Grains, beans, nuts, and seeds. Some of these may be available in unpackaged forms or large amounts (in bulk). Fresh seafood. Poultry and eggs. Low-fat dairy products. Buy whole ingredients instead of prepackaged foods. Buy fresh fruits and vegetables in-season from local farmers markets. Buy frozen fruits and vegetables in resealable bags. If you do not have access to quality fresh seafood, buy precooked frozen shrimp or canned fish, such as tuna, salmon, or sardines. Buy small amounts of raw or cooked vegetables, salads, or olives from the deli or salad bar at your store. Stock your pantry so you always have certain foods on hand, such as olive oil, canned tuna, canned tomatoes, rice, pasta, and beans. Cooking  Cook foods with extra-virgin olive oil instead of using butter or other vegetable oils. Have meat as a side dish, and have vegetables or grains as your main dish. This means having meat in small portions or adding small amounts of meat to foods like pasta or stew. Use beans or vegetables instead of meat in common dishes like chili or lasagna. Experiment with different cooking methods. Try roasting or broiling vegetables instead of steaming or sauteing  them. Add frozen vegetables to soups, stews, pasta, or rice. Add nuts or seeds for added healthy fat at each meal. You can add these to yogurt, salads, or vegetable dishes. Marinate fish or vegetables using olive oil, lemon juice, garlic, and fresh herbs. Meal planning  Plan to eat 1 vegetarian meal one day each week. Try to work up to 2 vegetarian meals, if possible. Eat seafood 2 or more times a week. Have healthy snacks readily available, such as: Vegetable sticks with hummus. Greek yogurt. Fruit and nut trail mix. Eat balanced meals throughout the week. This includes: Fruit: 2-3 servings a day Vegetables: 4-5 servings a day Low-fat dairy: 2 servings a day Fish, poultry, or lean meat: 1 serving a day Beans and legumes: 2 or more servings a week Nuts and seeds: 1-2 servings a day Whole grains: 6-8 servings a day Extra-virgin olive oil: 3-4 servings a day Limit red meat and sweets to only a few servings a month What are my food choices? Mediterranean diet Recommended Grains: Whole-grain pasta. Brown rice. Bulgar wheat. Polenta. Couscous. Whole-wheat bread. [place]. Vegetables: Artichokes. Beets. Broccoli. Cabbage. Carrots. Eggplant. Green beans. Chard. Kale. Spinach. Onions. Leeks. Peas. Squash. Tomatoes. Peppers. Radishes. Fruits: Apples. Apricots. Avocado. Berries. Bananas. Cherries. Dates. Figs. Grapes. Lemons. Melon. Oranges. Peaches. Plums. Pomegranate. Meats and other protein foods: Beans. Almonds. Sunflower seeds. Pine nuts. Peanuts. [place]. Salmon. Scallops. Shrimp. [place]. Tilapia. Clams. Oysters. Eggs. Dairy: Low-fat milk. Cheese. Greek yogurt. Beverages: Water. Red wine. Herbal tea. Fats and oils: Extra virgin olive oil. Avocado oil. Grape seed oil. Sweets and desserts: [place] yogurt with honey. Baked apples. Poached pears. Trail mix. Seasoning and other foods: Basil. Cilantro. Coriander. Cumin. Mint. Parsley. Sage. Rosemary. Tarragon. Garlic. Oregano. Thyme. Pepper.  Balsalmic vinegar. Tahini. Hummus. Tomato sauce. Olives. Mushrooms. Limit these Grains: Prepackaged pasta or rice dishes. Prepackaged cereal with added sugar. Vegetables: Deep fried potatoes (french fries). Fruits: Fruit canned in syrup. Meats and other protein foods: Beef. Pork. Lamb. Poultry with skin. Hot dogs. Berniece Salines. Dairy: Ice cream. Sour cream. Whole milk. Beverages: Juice. Sugar-sweetened soft drinks. Beer. Liquor and spirits. Fats and oils: Butter. Canola oil. Vegetable oil. Beef fat (tallow). Lard. Sweets and desserts: Cookies. Cakes. Pies. Candy. Seasoning and other foods: Mayonnaise. Premade sauces and marinades. The items listed may not be a complete list. Talk with your dietitian about what dietary choices are right for you. Summary The Mediterranean diet includes both food and lifestyle choices. Eat a variety of fresh fruits and vegetables, beans, nuts, seeds, and whole grains. Limit the amount of red meat and sweets that you eat. Talk with your health care provider about whether it is safe for you to drink red wine in moderation. This means 1 glass a day for nonpregnant women and 2 glasses a day for men. A glass of wine equals 5 oz (150 mL). This information is not intended to replace advice given to you by your health care provider. Make sure you discuss any questions you have with your health care provider. Document Released: 08/06/2016 Document  Revised: 09/08/2016 Document Reviewed: 08/06/2016 Elsevier Interactive Patient Education  2017 [employer].
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
## 2021-11-08 LAB — VITAMIN B1: Vitamin B1 (Thiamine): 50 nmol/L — ABNORMAL HIGH (ref 8–30)

## 2021-11-27 ENCOUNTER — Other Ambulatory Visit: Payer: Self-pay

## 2021-11-27 ENCOUNTER — Ambulatory Visit
Admission: RE | Admit: 2021-11-27 | Discharge: 2021-11-27 | Disposition: A | Discharge status: Home / Self Care | Payer: Managed Care, Other (non HMO) | Source: Ambulatory Visit | Attending: Physician Assistant | Admitting: Physician Assistant

## 2021-11-27 DIAGNOSIS — R413 Other amnesia: Secondary | ICD-10-CM

## 2021-12-03 ENCOUNTER — Telehealth: Payer: Self-pay | Admitting: Family Medicine

## 2021-12-03 DIAGNOSIS — F5104 Psychophysiologic insomnia: Secondary | ICD-10-CM

## 2021-12-03 MED ORDER — TRAZODONE HCL 100 MG PO TABS
200.0000 mg | ORAL_TABLET | Freq: Every day | ORAL | 3 refills | Status: DC
Start: 2021-12-03 — End: 2022-06-26

## 2021-12-03 NOTE — Telephone Encounter (Signed)
Patient notified VIA phone. No further questions. Dm/cma  

## 2021-12-03 NOTE — Telephone Encounter (Signed)
Spoke to patient and advised RX has refills at the pharmacy and if any problems to let us know. Dm/cma

## 2021-12-03 NOTE — Telephone Encounter (Signed)
Pt requesting refill on Trazodone

## 2021-12-03 NOTE — Telephone Encounter (Signed)
Please review and advise. Thanks. Dm/cma  

## 2021-12-05 ENCOUNTER — Ambulatory Visit: Payer: Managed Care, Other (non HMO) | Admitting: Physician Assistant

## 2021-12-05 ENCOUNTER — Other Ambulatory Visit: Payer: Self-pay

## 2021-12-05 ENCOUNTER — Encounter: Payer: Self-pay | Admitting: Physician Assistant

## 2021-12-05 VITALS — BP 159/76 | HR 100 | Resp 18 | Ht 71.0 in | Wt 298.0 lb

## 2021-12-05 DIAGNOSIS — R413 Other amnesia: Secondary | ICD-10-CM

## 2021-12-05 NOTE — Patient Instructions (Addendum)
It was a pleasure to see you today at our office.   Recommendations:   Follow up the upcoming Neurocognitive evaluation at our office  RECOMMENDATIONS FOR ALL PATIENTS WITH MEMORY PROBLEMS: 1. Continue to exercise (Recommend 30 minutes of walking everyday, or 3 hours every week) 2. Increase social interactions - continue going to [place] and enjoy social gatherings with friends and family 3. Eat healthy, avoid fried foods and eat more fruits and vegetables 4. Maintain adequate blood pressure, blood sugar, and blood cholesterol level. Reducing the risk of stroke and cardiovascular disease also helps promoting better memory. 5. Avoid stressful situations. Live a simple life and avoid aggravations. Organize your time and prepare for the next day in anticipation. 6. Sleep well, avoid any interruptions of sleep and avoid any distractions in the bedroom that may interfere with adequate sleep quality 7. Avoid sugar, avoid sweets as there is a strong link between excessive sugar intake, diabetes, and cognitive impairment We discussed the Mediterranean diet, which has been shown to help patients reduce the risk of progressive memory disorders and reduces cardiovascular risk. This includes eating fish, eat fruits and green leafy vegetables, nuts like almonds and hazelnuts, walnuts, and also use olive oil. Avoid fast foods and fried foods as much as possible. Avoid sweets and sugar as sugar use has been linked to worsening of memory function.  There is always a concern of gradual progression of memory problems. If this is the case, then we may need to adjust level of care according to patient needs. Support, both to the patient and caregiver, should then be put into place.      You have been referred for a neuropsychological evaluation (i.e., evaluation of memory and thinking abilities). Please bring someone with you to this appointment if possible, as it is helpful for the doctor to hear from both you and  another adult who knows you well. Please bring eyeglasses and hearing aids if you wear them.    The evaluation will take approximately 3 hours and has two parts:   The first part is a clinical interview with the neuropsychologist (Dr. Melvyn Novas or Dr. Nicole Kindred). During the interview, the neuropsychologist will speak with you and the individual you brought to the appointment.    The second part of the evaluation is testing with the doctor's technician Hinton Dyer or Maudie Mercury). During the testing, the technician will ask you to remember different types of material, solve problems, and answer some questionnaires. Your family member will not be present for this portion of the evaluation.   Please note: We must reserve several hours of the neuropsychologist's time and the psychometrician's time for your evaluation appointment. As such, there is a No-Show fee of $100. If you are unable to attend any of your appointments, please contact our office as soon as possible to reschedule.    FALL PRECAUTIONS: Be cautious when walking. Scan the area for obstacles that may increase the risk of trips and falls. When getting up in the mornings, sit up at the edge of the bed for a few minutes before getting out of bed. Consider elevating the bed at the head end to avoid drop of blood pressure when getting up. Walk always in a well-lit room (use night lights in the walls). Avoid area rugs or power cords from appliances in the middle of the walkways. Use a walker or a cane if necessary and consider physical therapy for balance exercise. Get your eyesight checked regularly.  FINANCIAL OVERSIGHT: Supervision, especially oversight  when making financial decisions or transactions is also recommended.  HOME SAFETY: Consider the safety of the kitchen when operating appliances like stoves, microwave oven, and blender. Consider having supervision and share cooking responsibilities until no longer able to participate in those. Accidents with  firearms and other hazards in the house should be identified and addressed as well.   ABILITY TO BE LEFT ALONE: If patient is unable to contact 911 operator, consider using LifeLine, or when the need is there, arrange for someone to stay with patients. Smoking is a fire hazard, consider supervision or cessation. Risk of wandering should be assessed by caregiver and if detected at any point, supervision and safe proof recommendations should be instituted.  MEDICATION SUPERVISION: Inability to self-administer medication needs to be constantly addressed. Implement a mechanism to ensure safe administration of the medications.   DRIVING: Regarding driving, in patients with progressive memory problems, driving will be impaired. We advise to have someone else do the driving if trouble finding directions or if minor accidents are reported. Independent driving assessment is available to determine safety of driving.   If you are interested in the driving assessment, you can contact the following:  [employer] in [place]  [address] [place] [phone] or [phone]    [place] refers to food and lifestyle choices that are based on the traditions of countries located on the [employer]. This way of eating has been shown to help prevent certain conditions and improve outcomes for people who have chronic diseases, like kidney disease and heart disease. What are tips for following this plan? Lifestyle  Cook and eat meals together with your family, when possible. Drink enough fluid to keep your urine clear or pale yellow. Be physically active every day. This includes: Aerobic exercise like running or swimming. Leisure activities like gardening, walking, or housework. Get 7-8 hours of sleep each night. If recommended by your health care provider, drink  red wine in moderation. This means 1 glass a day for nonpregnant women and 2 glasses a day for men. A glass of wine equals 5 oz (150 mL). Reading food labels  Check the serving size of packaged foods. For foods such as rice and pasta, the serving size refers to the amount of cooked product, not dry. Check the total fat in packaged foods. Avoid foods that have saturated fat or trans fats. Check the ingredients list for added sugars, such as corn syrup. Shopping  At the grocery store, buy most of your food from the areas near the walls of the store. This includes: Fresh fruits and vegetables (produce). Grains, beans, nuts, and seeds. Some of these may be available in unpackaged forms or large amounts (in bulk). Fresh seafood. Poultry and eggs. Low-fat dairy products. Buy whole ingredients instead of prepackaged foods. Buy fresh fruits and vegetables in-season from local farmers markets. Buy frozen fruits and vegetables in resealable bags. If you do not have access to quality fresh seafood, buy precooked frozen shrimp or canned fish, such as tuna, salmon, or sardines. Buy small amounts of raw or cooked vegetables, salads, or olives from the deli or salad bar at your store. Stock your pantry so you always have certain foods on hand, such as olive oil, canned tuna, canned tomatoes, rice, pasta, and beans. Cooking  Cook foods with extra-virgin olive oil instead of using butter or other vegetable oils. Have meat as a side dish, and  have vegetables or grains as your main dish. This means having meat in small portions or adding small amounts of meat to foods like pasta or stew. Use beans or vegetables instead of meat in common dishes like chili or lasagna. Experiment with different cooking methods. Try roasting or broiling vegetables instead of steaming or sauteing them. Add frozen vegetables to soups, stews, pasta, or rice. Add nuts or seeds for added healthy fat at each meal. You can add these to  yogurt, salads, or vegetable dishes. Marinate fish or vegetables using olive oil, lemon juice, garlic, and fresh herbs. Meal planning  Plan to eat 1 vegetarian meal one day each week. Try to work up to 2 vegetarian meals, if possible. Eat seafood 2 or more times a week. Have healthy snacks readily available, such as: Vegetable sticks with hummus. Greek yogurt. Fruit and nut trail mix. Eat balanced meals throughout the week. This includes: Fruit: 2-3 servings a day Vegetables: 4-5 servings a day Low-fat dairy: 2 servings a day Fish, poultry, or lean meat: 1 serving a day Beans and legumes: 2 or more servings a week Nuts and seeds: 1-2 servings a day Whole grains: 6-8 servings a day Extra-virgin olive oil: 3-4 servings a day Limit red meat and sweets to only a few servings a month What are my food choices? Mediterranean diet Recommended Grains: Whole-grain pasta. Brown rice. Bulgar wheat. Polenta. Couscous. Whole-wheat bread. [place]. Vegetables: Artichokes. Beets. Broccoli. Cabbage. Carrots. Eggplant. Green beans. Chard. Kale. Spinach. Onions. Leeks. Peas. Squash. Tomatoes. Peppers. Radishes. Fruits: Apples. Apricots. Avocado. Berries. Bananas. Cherries. Dates. Figs. Grapes. Lemons. Melon. Oranges. Peaches. Plums. Pomegranate. Meats and other protein foods: Beans. Almonds. Sunflower seeds. Pine nuts. Peanuts. Isabella. Salmon. Scallops. Shrimp. [place]. Tilapia. Clams. Oysters. Eggs. Dairy: Low-fat milk. Cheese. Greek yogurt. Beverages: Water. Red wine. Herbal tea. Fats and oils: Extra virgin olive oil. Avocado oil. Grape seed oil. Sweets and desserts: [place] yogurt with honey. Baked apples. Poached pears. Trail mix. Seasoning and other foods: Basil. Cilantro. Coriander. Cumin. Mint. Parsley. Sage. Rosemary. Tarragon. Garlic. Oregano. Thyme. Pepper. Balsalmic vinegar. Tahini. Hummus. Tomato sauce. Olives. Mushrooms. Limit these Grains: Prepackaged pasta or rice dishes. Prepackaged  cereal with added sugar. Vegetables: Deep fried potatoes (french fries). Fruits: Fruit canned in syrup. Meats and other protein foods: Beef. Pork. Lamb. Poultry with skin. Hot dogs. [place] Salines. Dairy: Ice cream. Sour cream. Whole milk. Beverages: Juice. Sugar-sweetened soft drinks. Beer. Liquor and spirits. Fats and oils: Butter. Canola oil. Vegetable oil. Beef fat (tallow). Lard. Sweets and desserts: Cookies. Cakes. Pies. Candy. Seasoning and other foods: Mayonnaise. Premade sauces and marinades. The items listed may not be a complete list. Talk with your dietitian about what dietary choices are right for you. Summary The Mediterranean diet includes both food and lifestyle choices. Eat a variety of fresh fruits and vegetables, beans, nuts, seeds, and whole grains. Limit the amount of red meat and sweets that you eat. Talk with your health care provider about whether it is safe for you to drink red wine in moderation. This means 1 glass a day for nonpregnant women and 2 glasses a day for men. A glass of wine equals 5 oz (150 mL). This information is not intended to replace advice given to you by your health care provider. Make sure you discuss any questions you have with your health care provider. Document Released: 08/06/2016 Document Revised: 09/08/2016 Document Reviewed: 08/06/2016 Elsevier Interactive Patient Education  2017 [employer].
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
## 2021-12-11 ENCOUNTER — Telehealth: Payer: Self-pay

## 2021-12-11 ENCOUNTER — Telehealth: Payer: Self-pay | Admitting: Family Medicine

## 2021-12-11 ENCOUNTER — Telehealth: Payer: Self-pay | Admitting: Endocrinology

## 2021-12-11 NOTE — Telephone Encounter (Signed)
Patient experiencing high BLOOD SUGARS - 400 last night, 400 this morning, patient took 70 units of insulin and his blood sugar is 287 right now.  Is requesting to speak to someone with regards to how to proceed and how to get his blood sugar down  Call back # [phone]

## 2021-12-11 NOTE — Telephone Encounter (Signed)
Messaged Dr Gena Fray on his phone regarding message and was advised the following instructions for patient.:  Since he has already increased his insulin, which is what I would advise.  If he is feeling ill, he should go to the ER.  If not, he should push water intake over night.  We could have him come at 8:00 am tomorrow and try to work him in.  I think he also seees endocrinology.  If so, they are primarily responsible for his diabetes and he should be contacting them as well.   Called patient on VIA phone, advised of instructions above and offered him an appointment  at 4:00 pm but he is unable to do that due to a work Christmas party then.  He will call in the morning if things aren't better to see if we have any appointments come available.  No further questions.  Dm/cma

## 2021-12-11 NOTE — Telephone Encounter (Signed)
Spoke and informed the patient to increase insulin to 75 units with breakfast and 40 with evening meal. Patient expressed understanding. Says that he did get them down over a period of time , no reason he can recall of why he may be experiencing high sugars.

## 2021-12-11 NOTE — Telephone Encounter (Signed)
Pt called back and he had talked to Tinika earlier and said that he was waiting on a call back from her. Tinika said that she is waiting to talk to Dr Gena Fray but he has already left for the day and will be back tomorrow. He understood but wanted to know what he needs to do about his high blood sugar tonight. Please advise

## 2021-12-12 NOTE — Telephone Encounter (Signed)
Please see other message regards to recommendations. Dm/cma

## 2022-01-05 ENCOUNTER — Telehealth: Payer: Self-pay | Admitting: Pulmonary Disease

## 2022-01-05 ENCOUNTER — Other Ambulatory Visit: Payer: Self-pay | Admitting: Pulmonary Disease

## 2022-01-06 ENCOUNTER — Other Ambulatory Visit: Payer: Self-pay | Admitting: Pulmonary Disease

## 2022-01-06 MED ORDER — CLONAZEPAM 2 MG PO TABS
2.0000 mg | ORAL_TABLET | Freq: Every day | ORAL | 3 refills | Status: DC
Start: 2022-01-06 — End: 2022-01-26

## 2022-01-06 NOTE — Telephone Encounter (Signed)
Please advise on refill.

## 2022-01-07 ENCOUNTER — Other Ambulatory Visit: Payer: Self-pay

## 2022-01-07 ENCOUNTER — Ambulatory Visit: Payer: Managed Care, Other (non HMO) | Admitting: Endocrinology

## 2022-01-07 VITALS — BP 150/72 | HR 73 | Ht 71.0 in | Wt 302.2 lb

## 2022-01-07 DIAGNOSIS — E1165 Type 2 diabetes mellitus with hyperglycemia: Secondary | ICD-10-CM

## 2022-01-07 DIAGNOSIS — E1140 Type 2 diabetes mellitus with diabetic neuropathy, unspecified: Secondary | ICD-10-CM

## 2022-01-07 DIAGNOSIS — E119 Type 2 diabetes mellitus without complications: Secondary | ICD-10-CM

## 2022-01-07 LAB — POCT GLYCOSYLATED HEMOGLOBIN (HGB A1C): Hemoglobin A1C: 9 % — AB (ref 4.0–5.6)

## 2022-01-07 LAB — GLUCOSE, POCT (MANUAL RESULT ENTRY): POC Glucose: 278 mg/dl — AB (ref 70–99)

## 2022-01-07 MED ORDER — INSULIN NPH ISOPHANE & REGULAR (70-30) 100 UNIT/ML ~~LOC~~ SUSP: SUBCUTANEOUS | 3 refills | Status: DC

## 2022-01-07 NOTE — Telephone Encounter (Signed)
ATC patient about refill, per DPR left detailed message that it was sent in. Nothing further needed at this time.

## 2022-01-07 NOTE — Patient Instructions (Addendum)
Your blood pressure is high today.  Please see your primary care provider soon, to have it rechecked.   check your blood sugar twice a day.  vary the time of day when you check, between before the 3 meals, and at bedtime.  also check if you have symptoms of your blood sugar being too high or too low.  please keep a record of the readings and bring it to your next appointment here (or you can bring the meter itself).  You can write it on any piece of paper.  please call us sooner if your blood sugar goes below 70, or if most of your readings are over 200.   Please increase the insulin to 75 units with breakfast, and 35 with the evening meal.   On this type of insulin schedule, you should eat meals on a regular schedule.  If a meal is missed or significantly delayed, your blood sugar could go low.   Please come back for a follow-up appointment in 2 months.

## 2022-01-07 NOTE — Telephone Encounter (Signed)
Rx already sent.
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
## 2022-01-07 NOTE — Telephone Encounter (Signed)
Medication refill was sent in

## 2022-01-08 ENCOUNTER — Encounter: Payer: Self-pay | Admitting: Family Medicine

## 2022-01-08 ENCOUNTER — Telehealth: Payer: Managed Care, Other (non HMO) | Admitting: Family Medicine

## 2022-01-08 ENCOUNTER — Telehealth: Payer: Self-pay | Admitting: *Deleted

## 2022-01-08 VITALS — Temp 97.5°F

## 2022-01-08 DIAGNOSIS — R0600 Dyspnea, unspecified: Secondary | ICD-10-CM

## 2022-01-08 DIAGNOSIS — R42 Dizziness and giddiness: Secondary | ICD-10-CM

## 2022-01-08 DIAGNOSIS — J989 Respiratory disorder, unspecified: Secondary | ICD-10-CM

## 2022-01-08 NOTE — Telephone Encounter (Signed)
Patient informed of the message below.

## 2022-01-08 NOTE — Telephone Encounter (Signed)
Per Dr Maudie Mercury, I called to check on the patient as due to dizziness and shortness of breath, she advised he go to the nearest urgent care or emergency room or call 911, stated the patient did not feel well and wanted to drive himself. Spoke with the patient stated he did not go as he has an appt with his PCP tomorrow at 9:20am.  Message sent to Dr Maudie Mercury.
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
## 2022-01-09 ENCOUNTER — Ambulatory Visit (INDEPENDENT_AMBULATORY_CARE_PROVIDER_SITE_OTHER): Payer: Managed Care, Other (non HMO) | Admitting: Nurse Practitioner

## 2022-01-09 ENCOUNTER — Encounter: Payer: Self-pay | Admitting: Nurse Practitioner

## 2022-01-09 VITALS — BP 144/62 | HR 85 | Temp 98.9°F | Resp 14

## 2022-01-09 DIAGNOSIS — R051 Acute cough: Secondary | ICD-10-CM

## 2022-01-09 DIAGNOSIS — R42 Dizziness and giddiness: Secondary | ICD-10-CM

## 2022-01-09 DIAGNOSIS — R52 Pain, unspecified: Secondary | ICD-10-CM

## 2022-01-09 DIAGNOSIS — J029 Acute pharyngitis, unspecified: Secondary | ICD-10-CM

## 2022-01-09 DIAGNOSIS — U071 COVID-19: Secondary | ICD-10-CM | POA: Insufficient documentation

## 2022-01-09 DIAGNOSIS — R0602 Shortness of breath: Secondary | ICD-10-CM

## 2022-01-09 HISTORY — DX: COVID-19: U07.1

## 2022-01-09 LAB — POCT INFLUENZA A/B
Influenza A, POC: NEGATIVE
Influenza B, POC: NEGATIVE

## 2022-01-09 LAB — POC COVID19 BINAXNOW: SARS Coronavirus 2 Ag: POSITIVE — AB

## 2022-01-09 MED ORDER — ALBUTEROL SULFATE HFA 108 (90 BASE) MCG/ACT IN AERS: 2.0000 | INHALATION_SPRAY | Freq: Four times a day (QID) | RESPIRATORY_TRACT | 2 refills | Status: DC | PRN

## 2022-01-09 MED ORDER — MOLNUPIRAVIR EUA 200MG CAPSULE: 4.0000 | ORAL_CAPSULE | Freq: Two times a day (BID) | ORAL | 0 refills | Status: AC

## 2022-01-09 MED ORDER — BENZONATATE 200 MG PO CAPS: 200.0000 mg | ORAL_CAPSULE | Freq: Two times a day (BID) | ORAL | 0 refills | Status: DC | PRN

## 2022-01-09 NOTE — Assessment & Plan Note (Signed)
Dizziness was brought on by change in position.  Did encourage patient to increase fluid intake and stay hydrated.  Change positions slowly having to do so.  Continue to monitor
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
## 2022-01-09 NOTE — Patient Instructions (Signed)
Nice to see you today You need to quarantine through 01/12/2022. If you are fever free for 24 hours without the use of tylenol or ibuprofen AND your symptoms on improving you can go out on 01/13/2022 but you have to wear a mask for the next 5 days (through 01/17/2022) Follow up if your symptoms fail to improve or worsen

## 2022-01-09 NOTE — Assessment & Plan Note (Signed)
Patient tested positive in office.  Did discuss treatment options including antivirals and that they are EUA only.  After joint discussion elected to use antiviral medications.  Side effects reviewed with patient.  CDC guidelines regards to quarantine reviewed with patient.  Signs and symptoms reviewed as when to seek urgent or emergent health care.  Start antiviral medication as soon as possible

## 2022-01-09 NOTE — Assessment & Plan Note (Signed)
Patient endorsing shortness of breath.  Patient is a diabetic we will avoid steroids.  We will send in some albuterol inhaler for as needed use.

## 2022-01-12 ENCOUNTER — Telehealth: Payer: Self-pay | Admitting: Endocrinology

## 2022-01-12 ENCOUNTER — Telehealth: Payer: Self-pay | Admitting: Family Medicine

## 2022-01-12 NOTE — Telephone Encounter (Signed)
Patient called and has Covid. He is experiencing High BG. His BG got as high as 575 last night.  78   am 193  lunch 137 now  Wants to speak with nurse or doctor concerning his illness and insulin intake to combat levels. Call patient at [phone].
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
## 2022-01-12 NOTE — Telephone Encounter (Signed)
Pt called in stating he feels really bad with cold and flu like symptoms. He was dx with covid on 01/09/22. Then we started talking more and he stated he had a blood sugar reading of 575-600 last night. I sent pt over to nurse triage.

## 2022-01-13 ENCOUNTER — Ambulatory Visit: Payer: Managed Care, Other (non HMO) | Admitting: Nurse Practitioner

## 2022-01-13 ENCOUNTER — Encounter: Payer: Self-pay | Admitting: Nurse Practitioner

## 2022-01-13 ENCOUNTER — Other Ambulatory Visit: Payer: Self-pay

## 2022-01-13 VITALS — BP 130/70 | HR 103 | Temp 97.4°F | Ht 71.0 in | Wt 306.0 lb

## 2022-01-13 DIAGNOSIS — J029 Acute pharyngitis, unspecified: Secondary | ICD-10-CM | POA: Diagnosis not present

## 2022-01-13 DIAGNOSIS — U071 COVID-19: Secondary | ICD-10-CM | POA: Diagnosis not present

## 2022-01-13 DIAGNOSIS — H6690 Otitis media, unspecified, unspecified ear: Secondary | ICD-10-CM

## 2022-01-13 LAB — POCT RAPID STREP A (OFFICE): Rapid Strep A Screen: NEGATIVE

## 2022-01-13 LAB — POC COVID19 BINAXNOW: SARS Coronavirus 2 Ag: POSITIVE — AB

## 2022-01-13 MED ORDER — PROMETHAZINE-DM 6.25-15 MG/5ML PO SYRP: 5.0000 mL | ORAL_SOLUTION | Freq: Four times a day (QID) | ORAL | 0 refills | Status: DC | PRN

## 2022-01-13 MED ORDER — PREDNISONE 10 MG PO TABS: ORAL_TABLET | ORAL | 0 refills | Status: DC

## 2022-01-13 MED ORDER — AMOXICILLIN-POT CLAVULANATE 875-125 MG PO TABS: 1.0000 | ORAL_TABLET | Freq: Two times a day (BID) | ORAL | 0 refills | Status: DC

## 2022-01-13 NOTE — Patient Instructions (Addendum)
It was great to see you!  I have sent in an antibiotic to your pharmacy for your ear infection. I have also sent in some prednisone to help with your breathing and cough medicine that you can take as needed. Continue monitoring your blood sugars while taking the prednisone and if they go up, call your endocrinologist (Dr. Loanne Drilling). Your albuterol inhaler has 2 refills left, call the pharmacy when you need a refill.   You can take tylenol as needed for pain along with gargling with warm salt water.   If your symptoms worsen, increased shortness of breath, worsening dizziness, or chest pain, call 911.   Keep your appointment with Dr. Gena Fray on 01/19/22.   Take care,  Gary Peper, NP

## 2022-01-13 NOTE — Assessment & Plan Note (Addendum)
He was diagnosed with covid-19 01/09/22. He is on his last day of molnupiravir. He states that the tessalon didn't help with his coughing. His sugars have been elevated, so was trying to avoid prednisone. However with ongoing shortness of breath, chest tightness, and wheezing, will give a short taper. He can call Dr. Loanne Drilling with endocrine if sugars are elevated. Oxygen levels were 95-98% while moving around the exam room. Will send in promethazine dm prn cough. Discussed calling 911 or going to the ER if his symptoms worsen at all, increasing shortness of breath, chest pain, or light-headedness. Keep next scheduled appointment with PCP on 01/19/22. Consider imaging if cough not improving. He needs a negative covid-19 test to go back to work, covid-19 test positive in office. Work note given.

## 2022-01-15 ENCOUNTER — Telehealth: Payer: Self-pay | Admitting: Family Medicine

## 2022-01-15 MED ORDER — PROMETHAZINE-DM 6.25-15 MG/5ML PO SYRP: 5.0000 mL | ORAL_SOLUTION | Freq: Four times a day (QID) | ORAL | 0 refills | Status: DC | PRN

## 2022-01-15 NOTE — Addendum Note (Signed)
Addended by: Vance Peper A on: 01/15/2022 04:44 PM   Modules accepted: Orders

## 2022-01-15 NOTE — Telephone Encounter (Signed)
Pt called back, pt said he is almost out of the cough medication sent in, he wants to know if more can be sent in. Please advise

## 2022-01-15 NOTE — Telephone Encounter (Signed)
Spoke to patient and resolve issue.

## 2022-01-15 NOTE — Telephone Encounter (Signed)
Called and spoke to patient, refill for promethazine cough syrup sent to preferred pharmacy. Pt was advised to seek emergency care if symptoms get worse. Pt voiced understanding. Sw, cma

## 2022-01-15 NOTE — Telephone Encounter (Signed)
Pt called again about the cough medicine.. it was not sent in

## 2022-01-16 ENCOUNTER — Telehealth: Payer: Self-pay | Admitting: Family Medicine

## 2022-01-16 MED ORDER — ALBUTEROL SULFATE (2.5 MG/3ML) 0.083% IN NEBU: 2.5000 mg | INHALATION_SOLUTION | Freq: Four times a day (QID) | RESPIRATORY_TRACT | 0 refills | Status: DC | PRN

## 2022-01-16 NOTE — Telephone Encounter (Signed)
Spoke with patient on the phone. He states that he is starting to feel a little better, but is still having wheezing and coughing. He has a nebulizer machine at home, but doesn't have any albuterol and is requesting a refill. Will send in a refill for albuterol nebulizer solution. He is able to talk in complete sentences on the phone and no audible wheezes heard. Discussed that if he is not having improvement with the nebulizer, or having ongoing shortness of breath and wheezing, he needs to be evaluated in person at the urgent care or ER. He verbalized understanding. Keep appointment on Monday with his PCP.

## 2022-01-16 NOTE — Telephone Encounter (Signed)
Called and explained to patient provider Vance Peper, NP suggested pt to seek urgent care service or go to ER for treatment or worsening symptoms. Provider has already refilled medications twice. Pt has appt with pcp Monday. Pt stated he does not want to wait in ER for hours or go to urgent care and feels he knows what is causing his SOB. Told pt I will escalate request to a provider. Sw, cma

## 2022-01-16 NOTE — Telephone Encounter (Signed)
Pt saw Lauren this week VV. She prescribed a cough medicine. He ran out wanted more. Lauren called in some pearls.Now he is insisting that someone call in a nebulizer with albuterol because he coughs so hard he can't breath. It was suggested that he go to emergency. He has an appt on Monday at 10:00. I have again suggested that he go to urgent care.

## 2022-01-16 NOTE — Addendum Note (Signed)
Addended by: Vance Peper A on: 01/16/2022 03:33 PM   Modules accepted: Orders

## 2022-01-19 ENCOUNTER — Ambulatory Visit: Payer: Managed Care, Other (non HMO) | Admitting: Family Medicine

## 2022-01-19 ENCOUNTER — Encounter: Payer: Self-pay | Admitting: Family Medicine

## 2022-01-19 ENCOUNTER — Other Ambulatory Visit: Payer: Self-pay

## 2022-01-19 VITALS — BP 140/78 | HR 62 | Temp 97.7°F | Ht 71.0 in | Wt 306.0 lb

## 2022-01-19 DIAGNOSIS — I10 Essential (primary) hypertension: Secondary | ICD-10-CM | POA: Diagnosis not present

## 2022-01-19 DIAGNOSIS — E1142 Type 2 diabetes mellitus with diabetic polyneuropathy: Secondary | ICD-10-CM | POA: Diagnosis not present

## 2022-01-19 DIAGNOSIS — E782 Mixed hyperlipidemia: Secondary | ICD-10-CM

## 2022-01-19 DIAGNOSIS — E1140 Type 2 diabetes mellitus with diabetic neuropathy, unspecified: Secondary | ICD-10-CM

## 2022-01-19 DIAGNOSIS — U071 COVID-19: Secondary | ICD-10-CM

## 2022-01-19 MED ORDER — PROMETHAZINE-DM 6.25-15 MG/5ML PO SYRP: 5.0000 mL | ORAL_SOLUTION | Freq: Four times a day (QID) | ORAL | 0 refills | Status: DC | PRN
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
## 2022-01-20 ENCOUNTER — Telehealth: Payer: Self-pay | Admitting: Family Medicine

## 2022-01-20 LAB — SARS-COV-2, NAA 2 DAY TAT

## 2022-01-20 LAB — NOVEL CORONAVIRUS, NAA: SARS-CoV-2, NAA: NOT DETECTED

## 2022-01-20 NOTE — Telephone Encounter (Signed)
Patient notified VIA phone that all results not back and we will touch base when they are in. Dm/cma

## 2022-01-21 NOTE — Telephone Encounter (Signed)
Patient notified VIA phone. No further questions. Dm/cma  

## 2022-01-21 NOTE — Telephone Encounter (Signed)
Lft VM to rtn call regarding results.  Dm/cma

## 2022-01-23 ENCOUNTER — Telehealth: Payer: Self-pay | Admitting: Family Medicine

## 2022-01-23 ENCOUNTER — Other Ambulatory Visit: Payer: Self-pay | Admitting: Orthopedic Surgery

## 2022-01-23 NOTE — Telephone Encounter (Signed)
Called patient notified that RX had refills and I had called the pharmacy to have them get them ready for him.  No further questions. Dm/cma

## 2022-01-23 NOTE — Telephone Encounter (Signed)
Patient called requested a refill on his medicine Zanaflex.  Advised patient he will need to be seen in the office, we have not seen him since 08/29/20.   He said to just send it back and see what he says.   [facility] [place]

## 2022-01-24 ENCOUNTER — Other Ambulatory Visit: Payer: Self-pay | Admitting: Pulmonary Disease

## 2022-01-26 ENCOUNTER — Encounter: Payer: Self-pay | Admitting: Primary Care

## 2022-01-26 ENCOUNTER — Telehealth: Payer: Self-pay | Admitting: Pulmonary Disease

## 2022-01-26 ENCOUNTER — Other Ambulatory Visit: Payer: Self-pay

## 2022-01-26 ENCOUNTER — Ambulatory Visit (INDEPENDENT_AMBULATORY_CARE_PROVIDER_SITE_OTHER): Payer: Managed Care, Other (non HMO) | Admitting: Primary Care

## 2022-01-26 DIAGNOSIS — Z9989 Dependence on other enabling machines and devices: Secondary | ICD-10-CM

## 2022-01-26 DIAGNOSIS — F5104 Psychophysiologic insomnia: Secondary | ICD-10-CM | POA: Diagnosis not present

## 2022-01-26 DIAGNOSIS — G4733 Obstructive sleep apnea (adult) (pediatric): Secondary | ICD-10-CM | POA: Diagnosis not present

## 2022-01-26 MED ORDER — BELSOMRA 20 MG PO TABS: 20.0000 | ORAL_TABLET | Freq: Every day | ORAL | 5 refills | Status: DC

## 2022-01-26 NOTE — Telephone Encounter (Signed)
I called the patient and advised him that he is past due for a follow up and I have made him an appointment  with [name], NP so he can get his medication refill on 01/26/22. Nothing further needed.

## 2022-01-26 NOTE — Patient Instructions (Addendum)
Recommendations: - Stop Sonata  - [employer] (do not combine with other sleep aids or alcohol) - Continue to wear CPAP every night for 4-6 hours or longer - Do not drive if experiencing daytime sleepiness, work on weight loss efforts   Sleep aid precautions:  Plan for full night rest, go to bed within a few minutes after taking medication. Stop wakeful activities after taking.  Do not drive after taking Avoid alcohol Beware of side effects    Follow-up: 6 months with Dr. Ander Slade or sooner if needed

## 2022-01-26 NOTE — Assessment & Plan Note (Addendum)
-   Patient has long standing hx insomnia. He has difficulty initiating and maintaining sleep. He has tried and failed several difference sleep apnea. Most recently on Sonata and klonopin but states that theses no longer working for him. He would like to resume Belsomra as he felt this worked well for him. Last dose of Sonata was on 01/24/22. Reviewed with Dr. Ander Slade. Stopping Sonata and Klonopin. Sending in [place] 20mg  prn insomnia. Can consider trying Dwana Curd in the future if continues having difficulty with finding the right medication for his insomnia. We reviewed PMP as well as safe use.

## 2022-01-26 NOTE — Assessment & Plan Note (Signed)
-   Patient is 86% compliant with CPAP use > 4 hours over the last 90 days  - Current pressure 13cm h20; residual AHI 2.8 - No changes today
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
## 2022-01-27 MED ORDER — TIZANIDINE HCL 4 MG PO TABS: 4.0000 mg | ORAL_TABLET | Freq: Four times a day (QID) | ORAL | 0 refills | Status: DC | PRN

## 2022-02-12 ENCOUNTER — Telehealth: Payer: Self-pay | Admitting: Family Medicine

## 2022-02-12 NOTE — Telephone Encounter (Signed)
FYI: Pt called stating he is having irregular heartbeat. I triaged him.

## 2022-02-12 NOTE — Telephone Encounter (Signed)
Noted. Dm/cma  

## 2022-02-12 NOTE — Telephone Encounter (Signed)
Patient scheduled for 02/13/22 @ 8:00 am.  Will do EKG before provider sees him (provider's recommendations).   Dm/cma

## 2022-02-13 ENCOUNTER — Encounter: Payer: Self-pay | Admitting: Interventional Cardiology

## 2022-02-13 ENCOUNTER — Ambulatory Visit: Payer: Managed Care, Other (non HMO)

## 2022-02-13 ENCOUNTER — Encounter: Payer: Self-pay | Admitting: Family Medicine

## 2022-02-13 ENCOUNTER — Other Ambulatory Visit: Payer: Self-pay

## 2022-02-13 ENCOUNTER — Ambulatory Visit: Payer: Managed Care, Other (non HMO) | Admitting: Interventional Cardiology

## 2022-02-13 ENCOUNTER — Ambulatory Visit: Payer: Managed Care, Other (non HMO) | Admitting: Family Medicine

## 2022-02-13 VITALS — BP 102/62 | HR 65 | Ht 71.0 in | Wt 305.6 lb

## 2022-02-13 VITALS — BP 124/66 | HR 69 | Temp 97.8°F | Ht 71.0 in | Wt 303.4 lb

## 2022-02-13 DIAGNOSIS — E1140 Type 2 diabetes mellitus with diabetic neuropathy, unspecified: Secondary | ICD-10-CM

## 2022-02-13 DIAGNOSIS — E1149 Type 2 diabetes mellitus with other diabetic neurological complication: Secondary | ICD-10-CM | POA: Diagnosis not present

## 2022-02-13 DIAGNOSIS — I480 Paroxysmal atrial fibrillation: Secondary | ICD-10-CM

## 2022-02-13 DIAGNOSIS — Z6841 Body Mass Index (BMI) 40.0 and over, adult: Secondary | ICD-10-CM

## 2022-02-13 DIAGNOSIS — I10 Essential (primary) hypertension: Secondary | ICD-10-CM | POA: Diagnosis not present

## 2022-02-13 DIAGNOSIS — G4733 Obstructive sleep apnea (adult) (pediatric): Secondary | ICD-10-CM | POA: Diagnosis not present

## 2022-02-13 DIAGNOSIS — Z9889 Other specified postprocedural states: Secondary | ICD-10-CM | POA: Diagnosis not present

## 2022-02-13 DIAGNOSIS — Z9989 Dependence on other enabling machines and devices: Secondary | ICD-10-CM

## 2022-02-13 DIAGNOSIS — Z8679 Personal history of other diseases of the circulatory system: Secondary | ICD-10-CM

## 2022-02-13 DIAGNOSIS — E782 Mixed hyperlipidemia: Secondary | ICD-10-CM

## 2022-02-13 DIAGNOSIS — R002 Palpitations: Secondary | ICD-10-CM

## 2022-02-13 DIAGNOSIS — I2510 Atherosclerotic heart disease of native coronary artery without angina pectoris: Secondary | ICD-10-CM

## 2022-02-13 LAB — COMPREHENSIVE METABOLIC PANEL
ALT: 30 U/L (ref 0–53)
AST: 23 U/L (ref 0–37)
Albumin: 4.3 g/dL (ref 3.5–5.2)
Alkaline Phosphatase: 62 U/L (ref 39–117)
BUN: 23 mg/dL (ref 6–23)
CO2: 31 mEq/L (ref 19–32)
Calcium: 9.5 mg/dL (ref 8.4–10.5)
Chloride: 102 mEq/L (ref 96–112)
Creatinine, Ser: 1.42 mg/dL (ref 0.40–1.50)
GFR: 52.78 mL/min — ABNORMAL LOW (ref >60.00)
Glucose, Bld: 219 mg/dL — ABNORMAL HIGH (ref 70–99)
Potassium: 4.4 mEq/L (ref 3.5–5.1)
Sodium: 137 mEq/L (ref 135–145)
Total Bilirubin: 0.9 mg/dL (ref 0.2–1.2)
Total Protein: 6.9 g/dL (ref 6.0–8.3)

## 2022-02-13 LAB — T4, FREE: Free T4: 0.87 ng/dL (ref 0.60–1.60)

## 2022-02-13 LAB — TSH: TSH: 1 u[IU]/mL (ref 0.35–5.50)

## 2022-02-13 NOTE — Patient Instructions (Addendum)
Medication Instructions:  Your physician recommends that you continue on your current medications as directed. Please refer to the Current Medication list given to you today.  *If you need a refill on your cardiac medications before your next appointment, please call your pharmacy*   Lab Work: none If you have labs (blood work) drawn today and your tests are completely normal, you will receive your results only by: [place] (if you have MyChart) OR A paper copy in the mail If you have any lab test that is abnormal or we need to change your treatment, we will call you to review the results.   Testing/Procedures: Your physician has requested that you have an echocardiogram. Echocardiography is a painless test that uses sound waves to create images of your heart. It provides your doctor with information about the size and shape of your heart and how well your hearts chambers and valves are working. This procedure takes approximately one hour. There are no restrictions for this procedure. To be done at [facility]  Dr Irish Lack has recommended you wear a 2 week zio monitor   Follow-Up: At [facility], you and your health needs are our priority.  As part of our continuing mission to provide you with exceptional heart care, we have created designated Provider Care Teams.  These Care Teams include your primary Cardiologist (physician) and Advanced Practice Providers (APPs -  Physician Assistants and Nurse Practitioners) who all work together to provide you with the care you need, when you need it.  We recommend signing up for the patient portal called "MyChart".  Sign up information is provided on this After Visit Summary.  MyChart is used to connect with patients for Virtual Visits (Telemedicine).  Patients are able to view lab/test results, encounter notes, upcoming appointments, etc.  Non-urgent messages can be sent to your provider as well.   To learn more about what you can  do with MyChart, go to [email or web address].    Your next appointment:   Mid March  The format for your next appointment:   In Person  Provider:   Jyl Heinz, MD    Other Instructions   ZIO XT- Long Term Monitor Instructions  Your physician has requested you wear a ZIO patch monitor for 14 days.  This is a single patch monitor. [employer] supplies one patch monitor per enrollment. Additional stickers are not available. Please do not apply patch if you will be having a Nuclear Stress Test,  Echocardiogram, Cardiac CT, MRI, or Chest Xray during the period you would be wearing the  monitor. The patch cannot be worn during these tests. You cannot remove and re-apply the  ZIO XT patch monitor.  Your ZIO patch monitor will be mailed 3 day USPS to your address on file. It may take 3-5 days  to receive your monitor after you have been enrolled.  Once you have received your monitor, please review the enclosed instructions. Your monitor  has already been registered assigning a specific monitor serial # to you.  Billing and Patient Assistance Program Information  We have supplied [employer] with any of your insurance information on file for billing purposes. [employer] offers a sliding scale Patient Assistance Program for patients that do not have  insurance, or whose insurance does not completely cover the cost of the ZIO monitor.  You must apply for the Patient Assistance Program to qualify for this discounted rate.  To apply, please call [employer] at [phone], select option 4, select option  2, ask to apply for  Patient Assistance Program. Theodore Demark will ask your household income, and how many people  are in your household. They will quote your out-of-pocket cost based on that information.  [employer] will also be able to set up a 38-month, interest-free payment plan if needed.  Applying the monitor   Shave hair from upper left chest.  Hold abrader disc by orange tab. Rub abrader in 40  strokes over the upper left chest as  indicated in your monitor instructions.  Clean area with 4 enclosed alcohol pads. Let dry.  Apply patch as indicated in monitor instructions. Patch will be placed under collarbone on left  side of chest with arrow pointing upward.  Rub patch adhesive wings for 2 minutes. Remove white label marked "1". Remove the white  label marked "2". Rub patch adhesive wings for 2 additional minutes.  While looking in a mirror, press and release button in center of patch. A small green light will  flash 3-4 times. This will be your only indicator that the monitor has been turned on.  Do not shower for the first 24 hours. You may shower after the first 24 hours.  Press the button if you feel a symptom. You will hear a small click. Record Date, Time and  Symptom in the Patient Logbook.  When you are ready to remove the patch, follow instructions on the last 2 pages of Patient  Logbook. Stick patch monitor onto the last page of Patient Logbook.  Place Patient Logbook in the blue and white box. Use locking tab on box and tape box closed  securely. The blue and white box has prepaid postage on it. Please place it in the mailbox as  soon as possible. Your physician should have your test results approximately 7 days after the  monitor has been mailed back to [facility].  Call [place] at [phone] if you have questions regarding  your ZIO XT patch monitor. Call them immediately if you see an orange light blinking on your  monitor.  If your monitor falls off in less than 4 days, contact our Monitor department at [phone].  If your monitor becomes loose or falls off after 4 days call [employer] at [phone] for  suggestions on securing your monitor
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
## 2022-02-13 NOTE — Telephone Encounter (Signed)
Patient seen.  Dm/cma

## 2022-02-13 NOTE — Progress Notes (Unsigned)
Enrolled pt for 14 day Zio XT to be mailed to home address.

## 2022-02-20 ENCOUNTER — Other Ambulatory Visit: Payer: Self-pay

## 2022-02-20 ENCOUNTER — Ambulatory Visit (HOSPITAL_BASED_OUTPATIENT_CLINIC_OR_DEPARTMENT_OTHER)
Admission: RE | Admit: 2022-02-20 | Discharge: 2022-02-20 | Disposition: A | Discharge status: Home / Self Care | Payer: Managed Care, Other (non HMO) | Source: Ambulatory Visit | Attending: Interventional Cardiology | Admitting: Interventional Cardiology

## 2022-02-20 DIAGNOSIS — I480 Paroxysmal atrial fibrillation: Secondary | ICD-10-CM

## 2022-02-20 LAB — ECHOCARDIOGRAM COMPLETE
AR max vel: 2.94 cm2
AV Area VTI: 2.92 cm2
AV Area mean vel: 3.08 cm2
AV Mean grad: 6 mmHg
AV Peak grad: 11.6 mmHg
Ao pk vel: 1.7 m/s
Area-P 1/2: 2.87 cm2
S' Lateral: 3.5 cm

## 2022-03-10 ENCOUNTER — Ambulatory Visit: Payer: Managed Care, Other (non HMO) | Admitting: Endocrinology

## 2022-03-12 ENCOUNTER — Ambulatory Visit: Payer: Managed Care, Other (non HMO) | Admitting: Cardiology

## 2022-03-12 ENCOUNTER — Encounter: Payer: Self-pay | Admitting: Psychology

## 2022-03-30 ENCOUNTER — Encounter: Payer: Managed Care, Other (non HMO) | Admitting: Psychology

## 2022-04-14 ENCOUNTER — Telehealth: Payer: Self-pay | Admitting: *Deleted

## 2022-04-14 NOTE — Telephone Encounter (Signed)
I placed call to patient to see if he wore monitor and mailed it back. [employer] has not received monitor.  Left message to call office ?

## 2022-04-20 ENCOUNTER — Ambulatory Visit: Payer: Managed Care, Other (non HMO) | Admitting: Family Medicine

## 2022-04-27 NOTE — Telephone Encounter (Signed)
I spoke with patient.  He has not mailed in monitor yet but plans to mail it back soon ?

## 2022-04-30 ENCOUNTER — Ambulatory Visit: Payer: BC Managed Care – PPO | Admitting: Endocrinology

## 2022-04-30 VITALS — BP 138/74 | HR 71 | Ht 71.0 in | Wt 320.4 lb

## 2022-04-30 DIAGNOSIS — E1165 Type 2 diabetes mellitus with hyperglycemia: Secondary | ICD-10-CM | POA: Diagnosis not present

## 2022-04-30 DIAGNOSIS — E119 Type 2 diabetes mellitus without complications: Secondary | ICD-10-CM

## 2022-04-30 LAB — POCT GLYCOSYLATED HEMOGLOBIN (HGB A1C): Hemoglobin A1C: 9.3 % — AB (ref 4.0–5.6)

## 2022-04-30 MED ORDER — INSULIN NPH ISOPHANE & REGULAR (70-30) 100 UNIT/ML ~~LOC~~ SUSP: SUBCUTANEOUS | 1 refills | Status: DC

## 2022-04-30 NOTE — Patient Instructions (Addendum)
check your blood sugar twice a day.  vary the time of day when you check, between before the 3 meals, and at bedtime.  also check if you have symptoms of your blood sugar being too high or too low.  please keep a record of the readings and bring it to your next appointment here (or you can bring the meter itself).  You can write it on any piece of paper.  please call us sooner if your blood sugar goes below 70, or if most of your readings are over 200.   ?Please increase the insulin to 100 units with breakfast, and 55 with the evening meal.   ?On this type of insulin schedule, you should eat meals on a regular schedule.  If a meal is missed or significantly delayed, your blood sugar could go low.   ?You should have an endocrinology follow-up appointment in 2 months.   ?
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
## 2022-05-04 ENCOUNTER — Telehealth: Payer: Self-pay | Admitting: Primary Care

## 2022-05-04 NOTE — Telephone Encounter (Signed)
Patient is completely out of Belsomra and with his new [employer]- he needs a prior authorization done. Patient requesting this stat as he cannot sleep without this medication.  ? ?Call back number [phone]. Please advise. ? ?

## 2022-05-05 ENCOUNTER — Other Ambulatory Visit (HOSPITAL_COMMUNITY): Payer: Self-pay

## 2022-05-05 ENCOUNTER — Telehealth: Payer: Self-pay | Admitting: Pharmacy Technician

## 2022-05-05 NOTE — Telephone Encounter (Signed)
Called and informed the pt that PA has been submitted  ?Will route back to rx pa pool so that they can document the outcome, thanks! ?

## 2022-05-05 NOTE — Telephone Encounter (Addendum)
Patient Advocate Encounter ? ?Received notification from [facility] that prior authorization for [facility] '20MG'$  is required. ?  ?PA submitted on 5.9.23 ?Key [id number] ?Status is pending ?  ?[facility] will continue to follow ? ?Janathan Bribiesca R Reyanne Hussar, CPhT ?Patient Advocate ?Phone: [phone] ?Fax:  [phone] ? ?

## 2022-05-05 NOTE — Telephone Encounter (Signed)
You're welcome!

## 2022-05-05 NOTE — Telephone Encounter (Signed)
PA has been submitted.

## 2022-05-08 ENCOUNTER — Other Ambulatory Visit (HOSPITAL_COMMUNITY): Payer: Self-pay

## 2022-05-08 NOTE — Telephone Encounter (Signed)
Received notification from [facility] regarding a prior authorization for [place] 20. Authorization has been APPROVED from 05.1.23 to 5.9.24.  ? ?Per test claim, copay for 30 days supply is $40 ? ?Called pharmacy, they have to order and will be in stock on Monday 5.15.23 ? ? ?

## 2022-05-18 ENCOUNTER — Ambulatory Visit: Payer: Managed Care, Other (non HMO) | Admitting: Family Medicine

## 2022-05-19 ENCOUNTER — Other Ambulatory Visit: Payer: Self-pay | Admitting: Family Medicine

## 2022-05-19 DIAGNOSIS — E1142 Type 2 diabetes mellitus with diabetic polyneuropathy: Secondary | ICD-10-CM

## 2022-05-19 MED ORDER — PREGABALIN 200 MG PO CAPS: 200.0000 mg | ORAL_CAPSULE | Freq: Two times a day (BID) | ORAL | 1 refills | Status: DC

## 2022-05-19 NOTE — Telephone Encounter (Signed)
Caller Name:Gary Grimes Call back phone #: [phone]  MEDICATION(S): pregabalin (LYRICA) 200 MG capsule [[id number]]    Days of Med Remaining: he will be out tomorrow, he needs by  05/20/22 in the morning. He does have an app on 05/20/22 at 2p  Has the patient contacted their pharmacy (YES/NO)?  Yes contact your PCP IF YES, when and what did the pharmacy advise?  IF NO, request that the patient contact the pharmacy for the refills in the future.             The pharmacy will send an electronic request (except for controlled medications).  [facility]erred [facility]: [id number] [place] [address] [id number] Phone:  [phone] Fax:  [phone] DEA #:  [id number] ~~~Please advise patient/caregiver to allow 2-3 business days to process RX refills.

## 2022-05-19 NOTE — Telephone Encounter (Signed)
[facility],  Lyrica RX was received but due to being a controlled substance only 1 refill can be put on them.  Can you please and thank you send in a refill for him.   Please review and advise.   Thanks. Dm/cma

## 2022-05-19 NOTE — Telephone Encounter (Signed)
Patient notified VIA phone. Dm/cma  

## 2022-05-20 ENCOUNTER — Encounter: Payer: Self-pay | Admitting: Family Medicine

## 2022-05-20 ENCOUNTER — Telehealth: Payer: Self-pay

## 2022-05-20 ENCOUNTER — Ambulatory Visit (INDEPENDENT_AMBULATORY_CARE_PROVIDER_SITE_OTHER): Payer: BC Managed Care – PPO | Admitting: Family Medicine

## 2022-05-20 VITALS — BP 130/74 | HR 92 | Temp 97.8°F | Ht 71.0 in | Wt 314.0 lb

## 2022-05-20 DIAGNOSIS — M5412 Radiculopathy, cervical region: Secondary | ICD-10-CM

## 2022-05-20 DIAGNOSIS — G4733 Obstructive sleep apnea (adult) (pediatric): Secondary | ICD-10-CM

## 2022-05-20 DIAGNOSIS — I10 Essential (primary) hypertension: Secondary | ICD-10-CM

## 2022-05-20 DIAGNOSIS — Z6841 Body Mass Index (BMI) 40.0 and over, adult: Secondary | ICD-10-CM

## 2022-05-20 DIAGNOSIS — Z9989 Dependence on other enabling machines and devices: Secondary | ICD-10-CM

## 2022-05-20 DIAGNOSIS — E1140 Type 2 diabetes mellitus with diabetic neuropathy, unspecified: Secondary | ICD-10-CM

## 2022-05-20 DIAGNOSIS — M62838 Other muscle spasm: Secondary | ICD-10-CM | POA: Diagnosis not present

## 2022-05-20 MED ORDER — PHENTERMINE HCL 15 MG PO CAPS: 15.0000 mg | ORAL_CAPSULE | ORAL | 0 refills | Status: DC

## 2022-05-20 MED ORDER — HYDROCODONE-ACETAMINOPHEN 10-325 MG PO TABS: 1.0000 | ORAL_TABLET | Freq: Three times a day (TID) | ORAL | 0 refills | Status: AC | PRN
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
## 2022-05-20 NOTE — Telephone Encounter (Signed)
PA for Pregablin 200 mg submitted through cover my meds.  Awaitng response.  Dm/cma  Key: [id number]  Also called [facility] '@[phone]'$ [phone] spoke tp Wes, he made the PA Urgent and cn still take up to 48 hours.  Called pharmacy and they will get 3 days of meds ready to be picked up for cash price $16.48.   Dm/cma   Case # [id number]

## 2022-05-21 DIAGNOSIS — M47812 Spondylosis without myelopathy or radiculopathy, cervical region: Secondary | ICD-10-CM | POA: Diagnosis not present

## 2022-05-21 DIAGNOSIS — R200 Anesthesia of skin: Secondary | ICD-10-CM | POA: Diagnosis not present

## 2022-05-21 DIAGNOSIS — R202 Paresthesia of skin: Secondary | ICD-10-CM | POA: Diagnosis not present

## 2022-05-21 LAB — COMPREHENSIVE METABOLIC PANEL
ALT: 27 U/L (ref 0–53)
AST: 29 U/L (ref 0–37)
Albumin: 4.2 g/dL (ref 3.5–5.2)
Alkaline Phosphatase: 76 U/L (ref 39–117)
BUN: 23 mg/dL (ref 6–23)
CO2: 26 mEq/L (ref 19–32)
Calcium: 9.5 mg/dL (ref 8.4–10.5)
Chloride: 102 mEq/L (ref 96–112)
Creatinine, Ser: 1.26 mg/dL (ref 0.40–1.50)
GFR: 60.81 mL/min (ref >60.00)
Glucose, Bld: 150 mg/dL — ABNORMAL HIGH (ref 70–99)
Potassium: 4.1 mEq/L (ref 3.5–5.1)
Sodium: 138 mEq/L (ref 135–145)
Total Bilirubin: 0.7 mg/dL (ref 0.2–1.2)
Total Protein: 6.9 g/dL (ref 6.0–8.3)

## 2022-05-21 LAB — HEMOGLOBIN A1C: Hgb A1c MFr Bld: 8.6 % — ABNORMAL HIGH (ref 4.6–6.5)

## 2022-05-21 LAB — TSH: TSH: 1.22 u[IU]/mL (ref 0.35–5.50)

## 2022-05-26 NOTE — Telephone Encounter (Signed)
PA approved for Pregabalin from 04/27/22 - 05/21/23.  Pharmacy and patient notified [facility] phone.  Dm/cma

## 2022-06-17 ENCOUNTER — Ambulatory Visit: Payer: BC Managed Care – PPO | Admitting: Family Medicine

## 2022-06-18 ENCOUNTER — Ambulatory Visit: Payer: Managed Care, Other (non HMO) | Admitting: Cardiology

## 2022-06-19 ENCOUNTER — Ambulatory Visit: Payer: BC Managed Care – PPO | Admitting: Family Medicine

## 2022-06-19 ENCOUNTER — Encounter: Payer: Self-pay | Admitting: Family Medicine

## 2022-06-19 VITALS — BP 126/70 | HR 75 | Temp 97.4°F | Ht 71.0 in | Wt 307.0 lb

## 2022-06-19 DIAGNOSIS — Z6841 Body Mass Index (BMI) 40.0 and over, adult: Secondary | ICD-10-CM | POA: Diagnosis not present

## 2022-06-19 MED ORDER — PHENTERMINE HCL 15 MG PO CAPS: 15.0000 mg | ORAL_CAPSULE | ORAL | 0 refills | Status: DC

## 2022-06-26 ENCOUNTER — Other Ambulatory Visit: Payer: Self-pay | Admitting: Family Medicine

## 2022-06-26 ENCOUNTER — Telehealth: Payer: Self-pay | Admitting: Family Medicine

## 2022-06-26 DIAGNOSIS — F5104 Psychophysiologic insomnia: Secondary | ICD-10-CM

## 2022-06-26 NOTE — Telephone Encounter (Signed)
Pt stated the pharmacy is saying they do not have his prescription for trazodone he will be out over the weekend and can not go without his sleep.  Please call in asap with administering directions. He seems to think that may be the reason they will not refill.

## 2022-06-26 NOTE — Telephone Encounter (Signed)
Called pharmacy and they had been filling an old RX for Trazadone 100 mg, 1 daily.  They had deactivated the Rx that was sent in 11/2021.  Gave a verbal for New RX for 2 tablets @ [facility], #180, 1 rf.   Lft detailed VM at home number to call if any issues getting refill. Dm/cma

## 2022-07-22 ENCOUNTER — Encounter: Payer: Self-pay | Admitting: Family Medicine

## 2022-07-22 ENCOUNTER — Telehealth (INDEPENDENT_AMBULATORY_CARE_PROVIDER_SITE_OTHER): Payer: BC Managed Care – PPO | Admitting: Family Medicine

## 2022-07-22 DIAGNOSIS — K529 Noninfective gastroenteritis and colitis, unspecified: Secondary | ICD-10-CM

## 2022-07-22 DIAGNOSIS — E1143 Type 2 diabetes mellitus with diabetic autonomic (poly)neuropathy: Secondary | ICD-10-CM | POA: Diagnosis not present

## 2022-07-22 DIAGNOSIS — K3184 Gastroparesis: Secondary | ICD-10-CM

## 2022-07-22 DIAGNOSIS — R197 Diarrhea, unspecified: Secondary | ICD-10-CM | POA: Diagnosis not present

## 2022-07-22 HISTORY — DX: Diarrhea, unspecified: R19.7

## 2022-07-22 MED ORDER — LOPERAMIDE HCL 2 MG PO TABS: 2.0000 mg | ORAL_TABLET | Freq: Four times a day (QID) | ORAL | 0 refills | Status: AC | PRN

## 2022-07-22 NOTE — Addendum Note (Signed)
Addended by: Beryle Lathe S on: 07/22/2022 12:11 PM   Modules accepted: Orders

## 2022-07-22 NOTE — Progress Notes (Signed)
Virtual Visit via Video Note  I connected with Gary Grimes on 07/22/22 at 10:40 AM EDT by a video enabled telemedicine application and verified that I am speaking with the correct person using two identifiers.  Location: Patient: Home Provider: Office   I discussed the limitations of evaluation and management by telemedicine and the availability of in person appointments. The patient expressed understanding and agreed to proceed.  Into the call, connectivity was lost and was converted to telephone visit  History of Present Illness: Diarrhea: Patient complains of diarrhea. Onset of diarrhea was 7 days ago. Diarrhea is occurring approximately 1 times per day. Patient describes diarrhea as watery. Diarrhea has been associated with abdominal pain described as sharp and generalized.  Patient denies blood in stool, fever, illness in household contacts, recent antibiotic use, recent camping.  Previous visits for diarrhea: yes, last seen 5 years ago by previous PCP, took medication in the past, but does not remember. Evaluation to date: colonoscopy and h/o gastroparesis and pancreatitis   Patient has not has covid testing. Patient tolerating PO fluids and liquids.  Patient has a low colonoscopy in 2020 for chronic diarrhea.  Colonoscopy was inconclusive for the cause.  Patient got follow-up with GI and a few years.  Patient also has a history of pancreatitis and gastroparesis.  Last hospitalized for gastroparesis around 2021.  Treatment to date: Nothing   Observations/Objective: Gen: NAD, resting comfortably HEENT: EOMI Pulm: NWOB Skin: no rash on face Neuro: no facial asymmetry or dysmetria Psych: Normal affect   Assessment and Plan: Problem List Items Addressed This Visit       Digestive   Diabetic gastroparesis ([name])     Other   Diarrhea - Primary    Patient with diarrhea, without vomiting or hematochezia Does have a history of chronic diarrhea, although has not had flares in a  long time Prior colonoscopy work-up was negative Other history of GI issues include pancreatitis and gastroparesis Patient has not not had COVID testing, Tolerating p.o. fluids well Given acute flare, presumed infectious, likely viral although does have a history of chronic diarrhea, cannot rule out this because Associated with abdominal pain, cannot rule out other causes such as pancreatitis, appendicitis, cholecystitis etc. Given patient overall not having severe pain and tolerating p.o., recommend symptomatic treatment with loperamide as needed and encourage p.o. hydration Will refer back to GI to reestablish care and further evaluate diarrhea if it continues Did discuss with patient that cannot rule out other causes of abdominal pain as listed above and describes seriousness of complications associated with this including hospitalization, surgery, death  Return and ED precautions discussed Point-of-care COVID testing recommended, patient will come by the office to get testing and will treat accordingly      Other Visit Diagnoses     Chronic diarrhea       Relevant Medications   loperamide (IMODIUM A-D) 2 MG tablet   Other Relevant Orders   Ambulatory referral to Gastroenterology        Follow Up Instructions:    I discussed the assessment and treatment plan with the patient. The patient was provided an opportunity to ask questions and all were answered. The patient agreed with the plan and demonstrated an understanding of the instructions.   The patient was advised to call back or seek an in-person evaluation if the symptoms worsen or if the condition fails to improve as anticipated.  I provided 20 minutes of non-face-to-face time during this encounter.   Marjory Lies  Dennis Bast, MD

## 2022-07-22 NOTE — Assessment & Plan Note (Signed)
Patient with diarrhea, without vomiting or hematochezia Does have a history of chronic diarrhea, although has not had flares in a long time Prior colonoscopy work-up was negative Other history of GI issues include pancreatitis and gastroparesis Patient has not not had COVID testing, Tolerating p.o. fluids well Given acute flare, presumed infectious, likely viral although does have a history of chronic diarrhea, cannot rule out this because Associated with abdominal pain, cannot rule out other causes such as pancreatitis, appendicitis, cholecystitis etc. Given patient overall not having severe pain and tolerating p.o., recommend symptomatic treatment with loperamide as needed and encourage p.o. hydration Will refer back to GI to reestablish care and further evaluate diarrhea if it continues Did discuss with patient that cannot rule out other causes of abdominal pain as listed above and describes seriousness of complications associated with this including hospitalization, surgery, death  Return and ED precautions discussed Point-of-care COVID testing recommended, patient will come by the office to get testing and will treat accordingly

## 2022-07-24 ENCOUNTER — Other Ambulatory Visit: Payer: Self-pay | Admitting: Family Medicine

## 2022-07-24 MED ORDER — PHENTERMINE HCL 15 MG PO CAPS: 15.0000 mg | ORAL_CAPSULE | ORAL | 0 refills | Status: DC

## 2022-07-24 NOTE — Telephone Encounter (Signed)
Refill for  Phentermine 15 mg LR 06/19/22, #30, 0 rf LOV 05/20/22 FOV none scheduled.     Please review and advise.  Thanks.  Dm/cma

## 2022-07-24 NOTE — Telephone Encounter (Signed)
Caller Name: pt Call back phone #: [phone]  Reason for Call: pt has 2 pills left of his phentermine. Please [facility] to  [facility] in [place] on [address].

## 2022-07-28 ENCOUNTER — Ambulatory Visit: Payer: Managed Care, Other (non HMO) | Admitting: Cardiology

## 2022-08-05 ENCOUNTER — Encounter (INDEPENDENT_AMBULATORY_CARE_PROVIDER_SITE_OTHER): Payer: Self-pay

## 2022-08-06 ENCOUNTER — Other Ambulatory Visit: Payer: Self-pay | Admitting: Family Medicine

## 2022-08-06 DIAGNOSIS — I10 Essential (primary) hypertension: Secondary | ICD-10-CM

## 2022-08-08 ENCOUNTER — Ambulatory Visit (HOSPITAL_COMMUNITY)
Admission: EM | Admit: 2022-08-08 | Discharge: 2022-08-08 | Disposition: A | Discharge status: Home / Self Care | Payer: BC Managed Care – PPO | Attending: Physician Assistant | Admitting: Physician Assistant

## 2022-08-08 ENCOUNTER — Encounter (HOSPITAL_COMMUNITY): Payer: Self-pay | Admitting: Emergency Medicine

## 2022-08-08 ENCOUNTER — Other Ambulatory Visit: Payer: Self-pay

## 2022-08-08 ENCOUNTER — Ambulatory Visit (HOSPITAL_COMMUNITY): Payer: BC Managed Care – PPO

## 2022-08-08 DIAGNOSIS — M545 Low back pain, unspecified: Secondary | ICD-10-CM | POA: Diagnosis not present

## 2022-08-08 MED ORDER — KETOROLAC TROMETHAMINE 30 MG/ML IJ SOLN
30.0000 mg | Freq: Once | INTRAMUSCULAR | Status: AC
  Administered 2022-08-08: 30 mg via INTRAMUSCULAR

## 2022-08-08 MED ORDER — KETOROLAC TROMETHAMINE 30 MG/ML IJ SOLN
INTRAMUSCULAR | Status: AC
  Filled 2022-08-08: qty 1

## 2022-08-08 MED ORDER — NAPROXEN 500 MG PO TABS: 500.0000 mg | ORAL_TABLET | Freq: Two times a day (BID) | ORAL | 0 refills | Status: DC
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
## 2022-08-08 NOTE — ED Triage Notes (Addendum)
Intermittent back pain for 2 weeks.  Has an appt with dr Tonita Cong on this Monday at [facility] .  Patient reports extreme debilitating pain started last night.  Patient reports "DDD".    Patient has pain radiating into both legs, posterior legs

## 2022-08-10 DIAGNOSIS — M545 Low back pain, unspecified: Secondary | ICD-10-CM | POA: Diagnosis not present

## 2022-08-10 DIAGNOSIS — M5459 Other low back pain: Secondary | ICD-10-CM | POA: Diagnosis not present

## 2022-08-11 ENCOUNTER — Telehealth: Payer: Self-pay | Admitting: Pulmonary Disease

## 2022-08-11 ENCOUNTER — Other Ambulatory Visit: Payer: Self-pay | Admitting: Primary Care

## 2022-08-11 NOTE — Telephone Encounter (Signed)
Per the last note he will need a 6 month video visit with APP to keep getting his medication.

## 2022-08-11 NOTE — Telephone Encounter (Signed)
Called patient and he answered the phone and states he will call us back he had a cousin on the other line.

## 2022-08-12 ENCOUNTER — Telehealth (INDEPENDENT_AMBULATORY_CARE_PROVIDER_SITE_OTHER): Payer: BC Managed Care – PPO | Admitting: Nurse Practitioner

## 2022-08-12 ENCOUNTER — Encounter: Payer: Self-pay | Admitting: Nurse Practitioner

## 2022-08-12 DIAGNOSIS — F5104 Psychophysiologic insomnia: Secondary | ICD-10-CM | POA: Diagnosis not present

## 2022-08-12 DIAGNOSIS — Z9989 Dependence on other enabling machines and devices: Secondary | ICD-10-CM | POA: Diagnosis not present

## 2022-08-12 DIAGNOSIS — G4733 Obstructive sleep apnea (adult) (pediatric): Secondary | ICD-10-CM | POA: Diagnosis not present

## 2022-08-12 MED ORDER — BELSOMRA 20 MG PO TABS: 20.0000 | ORAL_TABLET | Freq: Every day | ORAL | 5 refills | Status: DC

## 2022-08-12 NOTE — Telephone Encounter (Signed)
Called and left patient a voicemail stating that since he has a video visit with katie at [facility] is not willing to send in rx early and that he will need to see katie at 2pm to get his medication refilled. Nothing further needed
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
## 2022-08-12 NOTE — Patient Instructions (Addendum)
Continue to use CPAP every night, minimum of 4-6 hours a night.  Change equipment every 30 days or as directed by DME. Wash your tubing with warm soap and water daily, hang to dry. Wash humidifier portion weekly.  Be aware of reduced alertness and do not drive or operate heavy machinery if experiencing this or drowsiness.  Healthy weight management discussed.  Avoid or decrease alcohol consumption and medications that make you more sleepy, if possible. Notify if persistent daytime sleepiness occurs even with consistent use of CPAP.  Continue suvorexant (Belsomra) 20 mg nightly as needed insomnia  Follow up in 6 months with Dr. Ander Slade. If symptoms do not improve or worsen, please contact office for sooner follow up or seek emergency care.

## 2022-08-12 NOTE — Assessment & Plan Note (Addendum)
Longstanding history with chronic insomnia.  He has been tried on multiple different medications.  Previously on Belsomra, had switched to [place].  Reported in January that he felt as though these were not working well for him so he was restarted on Belsomra 20 mg nightly.  Has been doing well on this since.  No changes made to current regimen. He is also on trazodone, which has been a longstanding medication for him d/t Bipolar/depression. Instructed not to drive after taking these and to ensure that he applies his CPAP shortly after to reduce to risk of falling asleep without it.  Refill sent today.  Educated that he will need follow-up every 6 months while taking this medication.  Patient Instructions  Continue to use CPAP every night, minimum of 4-6 hours a night.  Change equipment every 30 days or as directed by DME. Wash your tubing with warm soap and water daily, hang to dry. Wash humidifier portion weekly.  Be aware of reduced alertness and do not drive or operate heavy machinery if experiencing this or drowsiness.  Healthy weight management discussed.  Avoid or decrease alcohol consumption and medications that make you more sleepy, if possible. Notify if persistent daytime sleepiness occurs even with consistent use of CPAP.  Continue suvorexant (Belsomra) 20 mg nightly as needed insomnia  Follow up in 6 months with Dr. Ander Slade. If symptoms do not improve or worsen, please contact office for sooner follow up or seek emergency care.

## 2022-08-12 NOTE — Assessment & Plan Note (Signed)
Excellent compliance and continues to receive good benefit. Continue CPAP 13 cmH2O nightly.

## 2022-08-13 ENCOUNTER — Ambulatory Visit: Payer: BC Managed Care – PPO | Admitting: Internal Medicine

## 2022-08-14 ENCOUNTER — Encounter: Payer: Self-pay | Admitting: Internal Medicine

## 2022-08-14 ENCOUNTER — Ambulatory Visit: Payer: BC Managed Care – PPO | Admitting: Internal Medicine

## 2022-08-14 VITALS — BP 148/78 | HR 83 | Ht 71.0 in | Wt 303.4 lb

## 2022-08-14 DIAGNOSIS — E1165 Type 2 diabetes mellitus with hyperglycemia: Secondary | ICD-10-CM | POA: Diagnosis not present

## 2022-08-14 DIAGNOSIS — E1159 Type 2 diabetes mellitus with other circulatory complications: Secondary | ICD-10-CM

## 2022-08-14 DIAGNOSIS — E782 Mixed hyperlipidemia: Secondary | ICD-10-CM

## 2022-08-14 LAB — POCT GLYCOSYLATED HEMOGLOBIN (HGB A1C): Hemoglobin A1C: 9 % — AB (ref 4.0–5.6)
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
## 2022-08-14 NOTE — Patient Instructions (Signed)
Please change: - NPH/regular 100 units before b'fast and 55 units before dinner Please inject 30 min before meals. Do not skip the injections even if the sugars are normal.  Please return in 3 months with your sugar log.   PATIENT INSTRUCTIONS FOR TYPE 2 DIABETES:  DIET AND EXERCISE Diet and exercise is an important part of diabetic treatment.  We recommended aerobic exercise in the form of brisk walking (working between 40-60% of maximal aerobic capacity, similar to brisk walking) for 150 minutes per week (such as 30 minutes five days per week) along with 3 times per week performing 'resistance' training (using various gauge rubber tubes with handles) 5-10 exercises involving the major muscle groups (upper body, lower body and core) performing 10-15 repetitions (or near fatigue) each exercise. Start at half the above goal but build slowly to reach the above goals. If limited by weight, joint pain, or disability, we recommend daily walking in a swimming pool with water up to waist to reduce pressure from joints while allow for adequate exercise.    BLOOD GLUCOSES Monitoring your blood glucoses is important for continued management of your diabetes. Please check your blood glucoses 2-4 times a day: fasting, before meals and at bedtime (you can rotate these measurements - e.g. one day check before the 3 meals, the next day check before 2 of the meals and before bedtime, etc.).   HYPOGLYCEMIA (low blood sugar) Hypoglycemia is usually a reaction to not eating, exercising, or taking too much insulin/ other diabetes drugs.  Symptoms include tremors, sweating, hunger, confusion, headache, etc. Treat IMMEDIATELY with 15 grams of Carbs: 4 glucose tablets  cup regular juice/soda 2 tablespoons raisins 4 teaspoons sugar 1 tablespoon honey Recheck blood glucose in 15 mins and repeat above if still symptomatic/blood glucose <100.  RECOMMENDATIONS TO REDUCE YOUR RISK OF DIABETIC COMPLICATIONS: * Take  your prescribed MEDICATION(S) * Follow a DIABETIC diet: Complex carbs, fiber rich foods, (monounsaturated and polyunsaturated) fats * AVOID saturated/trans fats, high fat foods, >2,300 mg salt per day. * EXERCISE at least 5 times a week for 30 minutes or preferably daily.  * DO NOT SMOKE OR DRINK more than 1 drink a day. * Check your FEET every day. Do not wear tightfitting shoes. Contact us if you develop an ulcer * See your EYE doctor once a year or more if needed * Get a FLU shot once a year * Get a PNEUMONIA vaccine once before and once after age 33 years  GOALS:  * Your Hemoglobin A1c of <7%  * fasting sugars need to be <130 * after meals sugars need to be <180 (2h after you start eating) * Your Systolic BP should be 010 or lower  * Your Diastolic BP should be 80 or lower  * Your HDL (Good Cholesterol) should be 40 or higher  * Your LDL (Bad Cholesterol) should be 100 or lower. * Your Triglycerides should be 150 or lower  * Your Urine microalbumin (kidney function) should be <30 * Your Body Mass Index should be 25 or lower    Please consider the following ways to cut down carbs and fat and increase fiber and micronutrients in your diet: - substitute whole grain for white bread or pasta - substitute brown rice for white rice - substitute 90-calorie flat bread pieces for slices of bread when possible - substitute sweet potatoes or yams for white potatoes - substitute humus for margarine - substitute tofu for cheese when possible - substitute almond or  rice milk for regular milk (would not drink soy milk daily due to concern for soy estrogen influence on breast cancer risk) - substitute dark chocolate for other sweets when possible - substitute water - can add lemon or orange slices for taste - for diet sodas (artificial sweeteners will trick your body that you can eat sweets without getting calories and will lead you to overeating and weight gain in the long run) - do not skip  breakfast or other meals (this will slow down the metabolism and will result in more weight gain over time)  - can try smoothies made from fruit and almond/rice milk in am instead of regular breakfast - can also try old-fashioned (not instant) oatmeal made with almond/rice milk in am - order the dressing on the side when eating salad at a restaurant (pour less than half of the dressing on the salad) - eat as little meat as possible - can try juicing, but should not forget that juicing will get rid of the fiber, so would alternate with eating raw veg./fruits or drinking smoothies - use as little oil as possible, even when using olive oil - can dress a salad with a mix of balsamic vinegar and lemon juice, for e.g. - use agave nectar, stevia sugar, or regular sugar rather than artificial sweateners - steam or broil/roast veggies  - snack on veggies/fruit/nuts (unsalted, preferably) when possible, rather than processed foods - reduce or eliminate aspartame in diet (it is in diet sodas, chewing gum, etc) Read the labels!  Try to read Dr. Janene Harvey book: "Program for Reversing Diabetes" for other ideas for healthy eating.

## 2022-08-18 DIAGNOSIS — M5416 Radiculopathy, lumbar region: Secondary | ICD-10-CM | POA: Diagnosis not present

## 2022-08-20 ENCOUNTER — Telehealth: Payer: BC Managed Care – PPO | Admitting: Nurse Practitioner

## 2022-08-22 DIAGNOSIS — Z724 Inappropriate diet and eating habits: Secondary | ICD-10-CM | POA: Diagnosis not present

## 2022-08-22 DIAGNOSIS — E639 Nutritional deficiency, unspecified: Secondary | ICD-10-CM | POA: Diagnosis not present

## 2022-08-22 DIAGNOSIS — Z723 Lack of physical exercise: Secondary | ICD-10-CM | POA: Diagnosis not present

## 2022-08-22 DIAGNOSIS — G4733 Obstructive sleep apnea (adult) (pediatric): Secondary | ICD-10-CM | POA: Diagnosis not present

## 2022-08-22 DIAGNOSIS — M5489 Other dorsalgia: Secondary | ICD-10-CM | POA: Diagnosis not present

## 2022-08-22 DIAGNOSIS — E669 Obesity, unspecified: Secondary | ICD-10-CM | POA: Diagnosis not present

## 2022-08-22 DIAGNOSIS — R5382 Chronic fatigue, unspecified: Secondary | ICD-10-CM | POA: Diagnosis not present

## 2022-08-22 DIAGNOSIS — Z713 Dietary counseling and surveillance: Secondary | ICD-10-CM | POA: Diagnosis not present

## 2022-08-22 DIAGNOSIS — M2550 Pain in unspecified joint: Secondary | ICD-10-CM | POA: Diagnosis not present

## 2022-08-24 DIAGNOSIS — M5451 Vertebrogenic low back pain: Secondary | ICD-10-CM | POA: Diagnosis not present

## 2022-08-27 ENCOUNTER — Encounter: Payer: Self-pay | Admitting: Cardiology

## 2022-08-27 ENCOUNTER — Ambulatory Visit: Payer: BC Managed Care – PPO | Attending: Cardiology | Admitting: Cardiology

## 2022-08-27 ENCOUNTER — Telehealth: Payer: Self-pay | Admitting: Cardiology

## 2022-08-27 VITALS — BP 142/78 | HR 66 | Ht 71.0 in | Wt 313.0 lb

## 2022-08-27 DIAGNOSIS — G4733 Obstructive sleep apnea (adult) (pediatric): Secondary | ICD-10-CM

## 2022-08-27 DIAGNOSIS — Z9889 Other specified postprocedural states: Secondary | ICD-10-CM

## 2022-08-27 DIAGNOSIS — Z9989 Dependence on other enabling machines and devices: Secondary | ICD-10-CM

## 2022-08-27 DIAGNOSIS — I10 Essential (primary) hypertension: Secondary | ICD-10-CM | POA: Diagnosis not present

## 2022-08-27 DIAGNOSIS — I2510 Atherosclerotic heart disease of native coronary artery without angina pectoris: Secondary | ICD-10-CM

## 2022-08-27 DIAGNOSIS — Z8679 Personal history of other diseases of the circulatory system: Secondary | ICD-10-CM

## 2022-08-27 DIAGNOSIS — E782 Mixed hyperlipidemia: Secondary | ICD-10-CM | POA: Diagnosis not present

## 2022-08-27 DIAGNOSIS — E1140 Type 2 diabetes mellitus with diabetic neuropathy, unspecified: Secondary | ICD-10-CM

## 2022-08-27 DIAGNOSIS — Z6841 Body Mass Index (BMI) 40.0 and over, adult: Secondary | ICD-10-CM

## 2022-08-27 NOTE — Patient Instructions (Signed)
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
## 2022-08-27 NOTE — Telephone Encounter (Signed)
Good afternoon doc.  MD came to see me today and he is post atrial fibrillation ablation.  He is not on anticoagulation and he does not remember who told him of the Eliquis that he used to be on.  Can you kindly review his chart and let me know what your thoughts are and I would appreciate it.  Thank you very much and have a wonderful evening

## 2022-08-28 DIAGNOSIS — E639 Nutritional deficiency, unspecified: Secondary | ICD-10-CM | POA: Diagnosis not present

## 2022-08-28 DIAGNOSIS — G4733 Obstructive sleep apnea (adult) (pediatric): Secondary | ICD-10-CM | POA: Diagnosis not present

## 2022-08-28 DIAGNOSIS — Z713 Dietary counseling and surveillance: Secondary | ICD-10-CM | POA: Diagnosis not present

## 2022-08-28 DIAGNOSIS — E669 Obesity, unspecified: Secondary | ICD-10-CM | POA: Diagnosis not present

## 2022-08-28 DIAGNOSIS — E8881 Metabolic syndrome: Secondary | ICD-10-CM | POA: Diagnosis not present

## 2022-09-01 ENCOUNTER — Telehealth: Payer: Self-pay | Admitting: Family Medicine

## 2022-09-01 ENCOUNTER — Encounter: Payer: Self-pay | Admitting: Family Medicine

## 2022-09-01 ENCOUNTER — Ambulatory Visit: Payer: BC Managed Care – PPO | Admitting: Family Medicine

## 2022-09-01 VITALS — BP 120/70 | HR 67 | Temp 97.4°F | Ht 71.0 in | Wt 312.2 lb

## 2022-09-01 DIAGNOSIS — J22 Unspecified acute lower respiratory infection: Secondary | ICD-10-CM

## 2022-09-01 DIAGNOSIS — R059 Cough, unspecified: Secondary | ICD-10-CM | POA: Diagnosis not present

## 2022-09-01 HISTORY — DX: Unspecified acute lower respiratory infection: J22

## 2022-09-01 LAB — POC COVID19 BINAXNOW: SARS Coronavirus 2 Ag: NEGATIVE

## 2022-09-01 MED ORDER — CLARITHROMYCIN ER 500 MG PO TB24: 1000.0000 mg | ORAL_TABLET | Freq: Every day | ORAL | 0 refills | Status: DC

## 2022-09-01 NOTE — Telephone Encounter (Signed)
Caller Name: [facility]  Call back phone #: [phone]  Reason for Call: Prescribed Clarithromycin which is having a reaction with Tizanidine and Trazodone. Please call pharmacy to advise on next steps

## 2022-09-01 NOTE — Progress Notes (Unsigned)
Established Patient Office Visit  Subjective   Patient ID: Gary Grimes, male    DOB: 01-04-1961  Age: 61 y.o. MRN: [id number]  Chief Complaint  Patient presents with   Sore Throat    Cough, sore throat, chest congestion x 8 days.     Sore Throat  Associated symptoms include coughing. Pertinent negatives include no abdominal pain or shortness of breath.   for follow-up of an 8-day history of URI symptoms that are not resolving.  Cough persists of scant phlegm.  There is been no fever or chills.  There has been some tightness with wheezing in the chest.  He has no asthma history.  He quit smoking 40 years ago.  Diabetes is not currently well controlled.  He is working with weight loss management through [facility].  They would like to start Saxenda.  He is taking atorvastatin to mitigate cardiovascular risk.  {History (Optional):23778}  Review of Systems  Constitutional: Negative.   HENT: Negative.    Eyes:  Negative for blurred vision, discharge and redness.  Respiratory:  Positive for cough, sputum production and wheezing. Negative for shortness of breath.   Cardiovascular: Negative.   Gastrointestinal:  Negative for abdominal pain.  Genitourinary: Negative.   Musculoskeletal: Negative.  Negative for myalgias.  Skin:  Negative for rash.  Neurological:  Negative for tingling, loss of consciousness and weakness.  Endo/Heme/Allergies:  Negative for polydipsia.      Objective:     BP 120/70 (BP Location: Right Arm, Patient Position: Sitting, Cuff Size: Normal)   Pulse 67   Temp (!) 97.4 F (36.3 C) (Temporal)   Ht '5\' 11"'$  (1.803 m)   Wt (!) 312 lb 3.2 oz (141.6 kg)   SpO2 95%   BMI 43.54 kg/m  {Vitals History (Optional):23777}  Physical Exam Constitutional:      General: He is not in acute distress.    Appearance: Normal appearance. He is not ill-appearing, toxic-appearing or diaphoretic.  HENT:     Head: Normocephalic and atraumatic.     Right Ear: External ear  normal.     Left Ear: External ear normal.     Mouth/Throat:     Mouth: Mucous membranes are moist.     Pharynx: Oropharynx is clear. No oropharyngeal exudate or posterior oropharyngeal erythema.  Eyes:     General: No scleral icterus.       Right eye: No discharge.        Left eye: No discharge.     Extraocular Movements: Extraocular movements intact.     Conjunctiva/sclera: Conjunctivae normal.     Pupils: Pupils are equal, round, and reactive to light.  Cardiovascular:     Rate and Rhythm: Normal rate and regular rhythm.  Pulmonary:     Effort: Pulmonary effort is normal. No respiratory distress.     Breath sounds: Normal breath sounds. No wheezing or rales.  Abdominal:     General: Bowel sounds are normal.     Tenderness: There is no abdominal tenderness. There is no guarding.  Musculoskeletal:     Cervical back: No rigidity or tenderness.  Skin:    General: Skin is warm and dry.  Neurological:     Mental Status: He is alert and oriented to person, place, and time.  Psychiatric:        Mood and Affect: Mood normal.        Behavior: Behavior normal.      No results found for any visits on 09/01/22.  {  Labs (Optional):23779}  The ASCVD Risk score (Arnett DK, et al., 2019) failed to calculate for the following reasons:   The valid total cholesterol range is 130 to 320 mg/dL    Assessment & Plan:   Problem List Items Addressed This Visit       Respiratory   Lower respiratory tract infection - Primary   Relevant Medications   clarithromycin (BIAXIN XL) 500 MG 24 hr tablet    Return in about 1 week (around 09/08/2022), or if symptoms worsen or fail to improve.    Libby Maw, MD

## 2022-09-01 NOTE — Addendum Note (Signed)
Addended by: Truddie Hidden on: 09/01/2022 05:04 PM   Modules accepted: Orders

## 2022-09-01 NOTE — Telephone Encounter (Signed)
Pt aware that Dr. [name] would like pt to see Dr. Curt Bears as he is no longer on anticoagulation. Pt verbalized understanding and had no additional questions.

## 2022-09-02 MED ORDER — AZITHROMYCIN 250 MG PO TABS: ORAL_TABLET | ORAL | 0 refills | Status: AC

## 2022-09-02 NOTE — Telephone Encounter (Signed)
Left VM that new RX was sent to the pharmacy.  If any  problems to call us back. Dm/cma

## 2022-09-04 ENCOUNTER — Ambulatory Visit: Payer: BC Managed Care – PPO | Admitting: Family Medicine

## 2022-09-04 ENCOUNTER — Ambulatory Visit: Payer: BC Managed Care – PPO | Admitting: Nurse Practitioner

## 2022-09-04 ENCOUNTER — Telehealth: Payer: Self-pay | Admitting: Family Medicine

## 2022-09-04 ENCOUNTER — Encounter: Payer: Self-pay | Admitting: Nurse Practitioner

## 2022-09-04 ENCOUNTER — Ambulatory Visit (INDEPENDENT_AMBULATORY_CARE_PROVIDER_SITE_OTHER)
Admission: RE | Admit: 2022-09-04 | Discharge: 2022-09-04 | Disposition: A | Discharge status: Home / Self Care | Payer: BC Managed Care – PPO | Source: Ambulatory Visit | Attending: Nurse Practitioner | Admitting: Nurse Practitioner

## 2022-09-04 VITALS — BP 120/70 | HR 62 | Temp 97.9°F | Ht 71.0 in | Wt 311.4 lb

## 2022-09-04 DIAGNOSIS — J209 Acute bronchitis, unspecified: Secondary | ICD-10-CM

## 2022-09-04 DIAGNOSIS — Z794 Long term (current) use of insulin: Secondary | ICD-10-CM | POA: Diagnosis not present

## 2022-09-04 DIAGNOSIS — E1165 Type 2 diabetes mellitus with hyperglycemia: Secondary | ICD-10-CM

## 2022-09-04 DIAGNOSIS — R0602 Shortness of breath: Secondary | ICD-10-CM | POA: Diagnosis not present

## 2022-09-04 DIAGNOSIS — R059 Cough, unspecified: Secondary | ICD-10-CM | POA: Diagnosis not present

## 2022-09-04 LAB — GLUCOSE, POCT (MANUAL RESULT ENTRY): POC Glucose: 153 mg/dl — AB (ref 70–99)

## 2022-09-04 MED ORDER — GUAIFENESIN ER 600 MG PO TB12: 600.0000 mg | ORAL_TABLET | Freq: Two times a day (BID) | ORAL | 0 refills | Status: DC | PRN

## 2022-09-04 MED ORDER — PROMETHAZINE-DM 6.25-15 MG/5ML PO SYRP: 5.0000 mL | ORAL_SOLUTION | Freq: Three times a day (TID) | ORAL | 0 refills | Status: DC | PRN

## 2022-09-04 MED ORDER — ALBUTEROL SULFATE HFA 108 (90 BASE) MCG/ACT IN AERS: 1.0000 | INHALATION_SPRAY | Freq: Four times a day (QID) | RESPIRATORY_TRACT | 0 refills | Status: DC | PRN

## 2022-09-04 MED ORDER — PREDNISONE 20 MG PO TABS: 40.0000 mg | ORAL_TABLET | Freq: Every day | ORAL | 0 refills | Status: AC

## 2022-09-04 MED ORDER — PREDNISONE 20 MG PO TABS: ORAL_TABLET | ORAL | 0 refills | Status: DC

## 2022-09-04 MED ORDER — ALBUTEROL SULFATE (2.5 MG/3ML) 0.083% IN NEBU
2.5000 mg | INHALATION_SOLUTION | Freq: Once | RESPIRATORY_TRACT | Status: AC
  Administered 2022-09-04: 2.5 mg via RESPIRATORY_TRACT

## 2022-09-04 NOTE — Progress Notes (Signed)
Established Patient Visit  Patient: Gary Grimes   DOB: 08-20-1961   61 y.o. Male  MRN: [id number] Visit Date: 09/04/2022  Subjective:    Chief Complaint  Patient presents with   Acute Visit    C/o cough x 2 weeks & coughing up "green stuff'"    HPI Mr. Piccione reports persistent productive cough, malaise and chest tightness. He was started on Azithromycin 2days ago. He also used Nyquil OTC. He denies any fever or SOB. Quit tobacco use 8yr ago Negative COVID test on 09/01/2022 Last hgbA1c 08/14/2022: 9%  Reviewed medical, surgical, and social history today  Medications: Outpatient Medications Prior to Visit  Medication Sig   aspirin EC 81 MG tablet Take 1 tablet (81 mg total) by mouth daily. Swallow whole.   atorvastatin (LIPITOR) 10 MG tablet Take 1 tablet (10 mg total) by mouth daily.   azithromycin (ZITHROMAX) 250 MG tablet Take 2 tablets on day 1, then 1 tablet daily on days 2 through 5   hydrOXYzine (ATARAX/VISTARIL) 25 MG tablet Take 25 mg by mouth at bedtime.   insulin NPH-regular Human (70-30) 100 UNIT/ML injection 100 units with breakfast, and 55 units with the evening meal   loperamide (IMODIUM A-D) 2 MG tablet Take 1 tablet (2 mg total) by mouth 4 (four) times daily as needed for diarrhea or loose stools.   naproxen (NAPROSYN) 500 MG tablet Take 1 tablet (500 mg total) by mouth 2 (two) times daily.   nitroGLYCERIN (NITROSTAT) 0.4 MG SL tablet Place 0.4 mg under the tongue daily. Every 5 minutes for chest pain   olmesartan (BENICAR) 40 MG tablet TAKE 1 TABLET BY MOUTH EVERY DAY   phentermine 15 MG capsule Take 1 capsule (15 mg total) by mouth every morning.   pregabalin (LYRICA) 200 MG capsule Take 1 capsule (200 mg total) by mouth 2 (two) times daily.   Suvorexant (BELSOMRA) 20 MG TABS Take 20 tablets by mouth at bedtime.   tiZANidine (ZANAFLEX) 4 MG tablet Take 1 tablet (4 mg total) by mouth every 6 (six) hours as needed for muscle spasms.   traZODone  (DESYREL) 100 MG tablet TAKE 2 TABLETS BY MOUTH AT BEDTIME   VRAYLAR 3 MG capsule Take 3 mg by mouth daily.   albuterol (PROVENTIL) (2.5 MG/3ML) 0.083% nebulizer solution Take 3 mLs (2.5 mg total) by nebulization every 6 (six) hours as needed for wheezing or shortness of breath. (Patient not taking: Reported on 07/22/2022)   [DISCONTINUED] albuterol (VENTOLIN HFA) 108 (90 Base) MCG/ACT inhaler Inhale 2 puffs into the lungs every 6 (six) hours as needed for wheezing or shortness of breath. (Patient not taking: Reported on 07/22/2022)   No facility-administered medications prior to visit.   Reviewed past medical and social history.   ROS per HPI above      Objective:  BP 120/70 (BP Location: Right Arm, Patient Position: Sitting, Cuff Size: Normal)   Pulse 62   Temp 97.9 F (36.6 C) (Temporal)   Ht '5\' 11"'$  (1.803 m)   Wt (!) 311 lb 6.4 oz (141.3 kg)   SpO2 95%   BMI 43.43 kg/m      Physical Exam Vitals reviewed.  Constitutional:      General: He is not in acute distress. Cardiovascular:     Rate and Rhythm: Normal rate.     Pulses: Normal pulses.  Pulmonary:     Effort: Pulmonary effort is normal.  Breath sounds: Wheezing present.     Comments: Clear lung sounds post nebulizer treatment. Neurological:     Mental Status: He is alert and oriented to person, place, and time.     Results for orders placed or performed in visit on 09/04/22  POCT Glucose (CBG)  Result Value Ref Range   POC Glucose 153 (A) 70 - 99 mg/dl      Assessment & Plan:    Problem List Items Addressed This Visit       Endocrine   Controlled type 2 diabetes mellitus, with long-term current use of insulin (HCC)   Other Visit Diagnoses     Acute bronchitis, unspecified organism    -  Primary   Relevant Medications   albuterol (PROVENTIL) (2.5 MG/3ML) 0.083% nebulizer solution 2.5 mg (Completed)   promethazine-dextromethorphan (PROMETHAZINE-DM) 6.25-15 MG/5ML syrup   guaiFENesin (MUCINEX) 600 MG  12 hr tablet   albuterol (VENTOLIN HFA) 108 (90 Base) MCG/ACT inhaler   predniSONE (DELTASONE) 20 MG tablet   Other Relevant Orders   DG Chest 2 View     Monitor glucose closely while using oral prednisone Call endocrinology if glucose > 250 Maintain adequate oral hydration. Go to lab for CXR. Complete azithromycin as prescribed. Use mucinex OTC and/or promethazine DM for cough. Use albuterol every 4-6hrs as needed for cough and wheezing  Return if symptoms worsen or fail to improve.     Wilfred Lacy, NP

## 2022-09-04 NOTE — Patient Instructions (Addendum)
Monitor glucose closely while using oral prednisone Call endocrinology if glucose > 250 Maintain adequate oral hydration. Go to lab for CXR. Complete azithromycin as prescribed. Use mucinex OTC and/or promethazine DM for cough. Use albuterol every 4-6hrs as needed for cough and wheezing

## 2022-09-04 NOTE — Telephone Encounter (Signed)
Pt is wanting  a cb concerning his most recent lab result from today 09/04/22. Please advise pt '@[phone]'$ [phone]

## 2022-09-05 DIAGNOSIS — E639 Nutritional deficiency, unspecified: Secondary | ICD-10-CM | POA: Diagnosis not present

## 2022-09-05 DIAGNOSIS — E8881 Metabolic syndrome: Secondary | ICD-10-CM | POA: Diagnosis not present

## 2022-09-05 DIAGNOSIS — Z713 Dietary counseling and surveillance: Secondary | ICD-10-CM | POA: Diagnosis not present

## 2022-09-05 DIAGNOSIS — E669 Obesity, unspecified: Secondary | ICD-10-CM | POA: Diagnosis not present

## 2022-09-07 ENCOUNTER — Telehealth: Payer: Self-pay | Admitting: Family Medicine

## 2022-09-07 NOTE — Telephone Encounter (Signed)
Pt said he is not betterafter finishing his zithromiacin. He thinks he needs something stronger. He has loud deep cough and congestion.

## 2022-09-07 NOTE — Telephone Encounter (Signed)
/  Called & spoke w/ pt, says he finished his zpack /and prednisone yesterday  and has not found any relief as of yet, he is still SOB, and has a lingering cough. Offered a f/u appt with Dr. Gena Fray, Adv he may need to visit the ED or urgent care if his symptoms persists. Pt voiced understanding

## 2022-09-08 NOTE — Telephone Encounter (Signed)
Scheduled for an appt on 09/09/22. Dm/cma

## 2022-09-09 ENCOUNTER — Ambulatory Visit: Payer: BC Managed Care – PPO | Admitting: Family Medicine

## 2022-09-09 ENCOUNTER — Encounter: Payer: Self-pay | Admitting: Family Medicine

## 2022-09-09 VITALS — BP 124/82 | HR 85 | Temp 98.5°F | Ht 71.0 in | Wt 311.0 lb

## 2022-09-09 DIAGNOSIS — J40 Bronchitis, not specified as acute or chronic: Secondary | ICD-10-CM

## 2022-09-09 DIAGNOSIS — J0100 Acute maxillary sinusitis, unspecified: Secondary | ICD-10-CM | POA: Diagnosis not present

## 2022-09-09 MED ORDER — PREDNISONE 20 MG PO TABS: ORAL_TABLET | ORAL | 0 refills | Status: AC

## 2022-09-09 MED ORDER — AMOXICILLIN-POT CLAVULANATE 875-125 MG PO TABS: 1.0000 | ORAL_TABLET | Freq: Two times a day (BID) | ORAL | 0 refills | Status: DC

## 2022-09-09 MED ORDER — PROMETHAZINE-DM 6.25-15 MG/5ML PO SYRP: 5.0000 mL | ORAL_SOLUTION | Freq: Three times a day (TID) | ORAL | 0 refills | Status: DC | PRN

## 2022-09-09 NOTE — Patient Instructions (Signed)
Use albuterol inhaler or nebulizer every 4 hours while awake for 48 hours, then as needed.
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
## 2022-09-11 DIAGNOSIS — E8881 Metabolic syndrome: Secondary | ICD-10-CM | POA: Diagnosis not present

## 2022-09-11 DIAGNOSIS — E669 Obesity, unspecified: Secondary | ICD-10-CM | POA: Diagnosis not present

## 2022-09-11 DIAGNOSIS — Z713 Dietary counseling and surveillance: Secondary | ICD-10-CM | POA: Diagnosis not present

## 2022-09-11 DIAGNOSIS — E639 Nutritional deficiency, unspecified: Secondary | ICD-10-CM | POA: Diagnosis not present

## 2022-09-22 ENCOUNTER — Telehealth: Payer: Self-pay | Admitting: Family Medicine

## 2022-09-22 NOTE — Telephone Encounter (Signed)
Left VM to rtn call.  Per Dr  Gena Fray will need an appointment to fully discuss getting these done. Dm/cma

## 2022-09-22 NOTE — Telephone Encounter (Signed)
Pt called and want to know can dr Gena Fray do Tb screening, 10 panel drug test , chicken pox vaccine and flu shot

## 2022-09-30 ENCOUNTER — Telehealth: Payer: Self-pay | Admitting: Family Medicine

## 2022-09-30 ENCOUNTER — Institutional Professional Consult (permissible substitution): Payer: BC Managed Care – PPO | Admitting: Cardiology

## 2022-09-30 DIAGNOSIS — E119 Type 2 diabetes mellitus without complications: Secondary | ICD-10-CM | POA: Diagnosis not present

## 2022-09-30 DIAGNOSIS — E785 Hyperlipidemia, unspecified: Secondary | ICD-10-CM | POA: Diagnosis not present

## 2022-09-30 DIAGNOSIS — I10 Essential (primary) hypertension: Secondary | ICD-10-CM | POA: Diagnosis not present

## 2022-09-30 DIAGNOSIS — E669 Obesity, unspecified: Secondary | ICD-10-CM | POA: Diagnosis not present

## 2022-09-30 NOTE — Telephone Encounter (Signed)
Caller Name: Kvon Mcilhenny Call back phone #: [phone]  Reason for Call: Pt has been dealing with episodes of vertigo, please give him a call to speak with him about recommendations

## 2022-10-01 NOTE — Telephone Encounter (Signed)
Spoke to patient he states that he has been having som dizziness when getting up off/on.   He used to be on something for this 2 1/2 months ago but the RX expired.  The earliest he can come for an appointment is 10/08/22 and if it gets worse he will call us to get in sooner.  He wanted to know if you could send something in for him before that appointment?   Please review and advise.  Thanks.  Dm/cma

## 2022-10-02 NOTE — Telephone Encounter (Signed)
Spoke to patient and the earliest he can come in is the 10/08/22. Dm/cma

## 2022-10-05 NOTE — Telephone Encounter (Signed)
Noted has an  appointment on 10/12 and was advised to f/u if not any better.  Dm/cma

## 2022-10-07 DIAGNOSIS — E119 Type 2 diabetes mellitus without complications: Secondary | ICD-10-CM | POA: Diagnosis not present

## 2022-10-07 DIAGNOSIS — I10 Essential (primary) hypertension: Secondary | ICD-10-CM | POA: Diagnosis not present

## 2022-10-07 DIAGNOSIS — E785 Hyperlipidemia, unspecified: Secondary | ICD-10-CM | POA: Diagnosis not present

## 2022-10-07 DIAGNOSIS — E669 Obesity, unspecified: Secondary | ICD-10-CM | POA: Diagnosis not present

## 2022-10-08 ENCOUNTER — Encounter: Payer: Self-pay | Admitting: Family Medicine

## 2022-10-08 ENCOUNTER — Other Ambulatory Visit: Payer: Self-pay | Admitting: Family Medicine

## 2022-10-08 ENCOUNTER — Ambulatory Visit: Payer: BC Managed Care – PPO | Admitting: Family Medicine

## 2022-10-08 ENCOUNTER — Telehealth: Payer: Self-pay

## 2022-10-08 VITALS — BP 124/78 | HR 83 | Temp 97.9°F | Ht 71.0 in | Wt 312.8 lb

## 2022-10-08 DIAGNOSIS — N410 Acute prostatitis: Secondary | ICD-10-CM | POA: Diagnosis not present

## 2022-10-08 DIAGNOSIS — Z0184 Encounter for antibody response examination: Secondary | ICD-10-CM

## 2022-10-08 DIAGNOSIS — R42 Dizziness and giddiness: Secondary | ICD-10-CM

## 2022-10-08 DIAGNOSIS — I10 Essential (primary) hypertension: Secondary | ICD-10-CM

## 2022-10-08 DIAGNOSIS — Z23 Encounter for immunization: Secondary | ICD-10-CM | POA: Diagnosis not present

## 2022-10-08 MED ORDER — OLMESARTAN MEDOXOMIL 20 MG PO TABS: 30.0000 mg | ORAL_TABLET | Freq: Every day | ORAL | 1 refills | Status: DC

## 2022-10-08 MED ORDER — LEVOFLOXACIN 500 MG PO TABS: 500.0000 mg | ORAL_TABLET | Freq: Every day | ORAL | 0 refills | Status: AC
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
## 2022-10-08 NOTE — Telephone Encounter (Signed)
PA submitted awating response.  Dm/cma   Key: Gary Grimes

## 2022-10-08 NOTE — Telephone Encounter (Signed)
PA for Olmarsartan 20 mg submitted through cover my meds.  Awaiting response.  Dm/cma   Key: Tawny Asal

## 2022-10-09 LAB — MEASLES/MUMPS/RUBELLA IMMUNITY
Mumps IgG: >300 AU/mL
Rubella: >33 Index
Rubeola IgG: >300 AU/mL

## 2022-10-12 NOTE — Telephone Encounter (Signed)
PA approved and pharmacy notified [place] phone. Dm/cma

## 2022-10-14 ENCOUNTER — Telehealth: Payer: Self-pay | Admitting: Family Medicine

## 2022-10-14 ENCOUNTER — Ambulatory Visit: Payer: BC Managed Care – PPO

## 2022-10-14 DIAGNOSIS — E785 Hyperlipidemia, unspecified: Secondary | ICD-10-CM | POA: Diagnosis not present

## 2022-10-14 DIAGNOSIS — I10 Essential (primary) hypertension: Secondary | ICD-10-CM | POA: Diagnosis not present

## 2022-10-14 DIAGNOSIS — E669 Obesity, unspecified: Secondary | ICD-10-CM | POA: Diagnosis not present

## 2022-10-14 DIAGNOSIS — E119 Type 2 diabetes mellitus without complications: Secondary | ICD-10-CM | POA: Diagnosis not present

## 2022-10-14 DIAGNOSIS — Z23 Encounter for immunization: Secondary | ICD-10-CM

## 2022-10-14 DIAGNOSIS — Z111 Encounter for screening for respiratory tuberculosis: Secondary | ICD-10-CM | POA: Diagnosis not present

## 2022-10-14 NOTE — Telephone Encounter (Signed)
Pt called and wanted to set up a TB test.leave message on pt voicemail if he don't answer

## 2022-10-14 NOTE — Telephone Encounter (Signed)
Patient scheduled for nurse visit today.  Dm/cma

## 2022-10-14 NOTE — Progress Notes (Signed)
PPD Placement note Gary Grimes, 61 y.o. male is here today for placement of PPD test Reason for PPD test: work Pt taken PPD test before: no Verified in allergy area and with patient that they are not allergic to the products PPD is made of (Phenol or Tween). No:  Is patient taking any oral or IV steroid medication now or have they taken it in the last month? no Has the patient ever received the BCG vaccine?: no Has the patient been in recent contact with anyone known or suspected of having active TB disease?: no      Date of exposure (if applicable):       Name of person they were exposed to (if applicable):  Patient's Country of origin?: [name]: Alert and oriented in NAD. P:  PPD placed on 10/14/2022.  Patient advised to return for reading within 48-72 hours.

## 2022-10-15 ENCOUNTER — Other Ambulatory Visit: Payer: Self-pay | Admitting: Cardiology

## 2022-10-16 ENCOUNTER — Ambulatory Visit (INDEPENDENT_AMBULATORY_CARE_PROVIDER_SITE_OTHER): Payer: BC Managed Care – PPO

## 2022-10-16 DIAGNOSIS — Z111 Encounter for screening for respiratory tuberculosis: Secondary | ICD-10-CM | POA: Diagnosis not present

## 2022-10-16 NOTE — Progress Notes (Signed)
PPD Reading Note PPD read and results entered in [facility]. Result: 0 mm induration. Interpretation: Negative If test not within 48-72 hours of initial placement, patient advised to repeat in other arm 1-3 weeks after this test. Allergic reaction: no

## 2022-10-19 ENCOUNTER — Telehealth: Payer: Self-pay | Admitting: Family Medicine

## 2022-10-19 DIAGNOSIS — Z0184 Encounter for antibody response examination: Secondary | ICD-10-CM

## 2022-10-19 NOTE — Telephone Encounter (Signed)
Pt called and stated that he would like chickenpox vaccine

## 2022-10-19 NOTE — Telephone Encounter (Signed)
Caller Name: Kennen Stammer Call back phone #: [phone]  Reason for Call: Would like chicken pox vaccine, this is urgent for work

## 2022-10-20 NOTE — Telephone Encounter (Signed)
Spoke to patient, advised that he would need to have a blood titer done to prove he had chicken pox in 1978 and that Dr Gena Fray would have to order this.  Will send to provider on 10/23/22 at end of day so he can look at this and order the blood work. Will call patient back when lab ordered.   Dm/cma

## 2022-10-21 DIAGNOSIS — E785 Hyperlipidemia, unspecified: Secondary | ICD-10-CM | POA: Diagnosis not present

## 2022-10-21 DIAGNOSIS — E119 Type 2 diabetes mellitus without complications: Secondary | ICD-10-CM | POA: Diagnosis not present

## 2022-10-21 DIAGNOSIS — I10 Essential (primary) hypertension: Secondary | ICD-10-CM | POA: Diagnosis not present

## 2022-10-21 DIAGNOSIS — E669 Obesity, unspecified: Secondary | ICD-10-CM | POA: Diagnosis not present

## 2022-10-26 NOTE — Addendum Note (Signed)
Addended by: Haydee Salter on: 10/26/2022 06:15 PM   Modules accepted: Orders

## 2022-10-27 ENCOUNTER — Other Ambulatory Visit (INDEPENDENT_AMBULATORY_CARE_PROVIDER_SITE_OTHER): Payer: BC Managed Care – PPO

## 2022-10-27 DIAGNOSIS — Z0184 Encounter for antibody response examination: Secondary | ICD-10-CM

## 2022-10-27 NOTE — Telephone Encounter (Signed)
Patient scheduled for Lab apportionment today.  Dm/cma

## 2022-10-28 DIAGNOSIS — E669 Obesity, unspecified: Secondary | ICD-10-CM | POA: Diagnosis not present

## 2022-10-28 DIAGNOSIS — E119 Type 2 diabetes mellitus without complications: Secondary | ICD-10-CM | POA: Diagnosis not present

## 2022-10-28 DIAGNOSIS — E785 Hyperlipidemia, unspecified: Secondary | ICD-10-CM | POA: Diagnosis not present

## 2022-10-28 DIAGNOSIS — I10 Essential (primary) hypertension: Secondary | ICD-10-CM | POA: Diagnosis not present

## 2022-10-28 LAB — VARICELLA ZOSTER ANTIBODY, IGG: Varicella IgG: >4000 index

## 2022-10-29 DIAGNOSIS — F509 Eating disorder, unspecified: Secondary | ICD-10-CM | POA: Diagnosis not present

## 2022-10-29 DIAGNOSIS — F4323 Adjustment disorder with mixed anxiety and depressed mood: Secondary | ICD-10-CM | POA: Diagnosis not present

## 2022-11-03 DIAGNOSIS — E785 Hyperlipidemia, unspecified: Secondary | ICD-10-CM | POA: Diagnosis not present

## 2022-11-03 DIAGNOSIS — I10 Essential (primary) hypertension: Secondary | ICD-10-CM | POA: Diagnosis not present

## 2022-11-03 DIAGNOSIS — E119 Type 2 diabetes mellitus without complications: Secondary | ICD-10-CM | POA: Diagnosis not present

## 2022-11-03 DIAGNOSIS — E669 Obesity, unspecified: Secondary | ICD-10-CM | POA: Diagnosis not present

## 2022-11-05 DIAGNOSIS — F4323 Adjustment disorder with mixed anxiety and depressed mood: Secondary | ICD-10-CM | POA: Diagnosis not present

## 2022-11-12 ENCOUNTER — Telehealth: Payer: Self-pay | Admitting: Pulmonary Disease

## 2022-11-12 ENCOUNTER — Other Ambulatory Visit: Payer: Self-pay | Admitting: Pulmonary Disease

## 2022-11-12 MED ORDER — ZOLPIDEM TARTRATE 10 MG PO TABS: 10.0000 mg | ORAL_TABLET | Freq: Every evening | ORAL | 1 refills | Status: DC | PRN

## 2022-11-12 NOTE — Telephone Encounter (Signed)
Called and spoke with pt letting him know that Dr. Jenetta Downer sent Rx for Lorrin Mais to pharmacy and he verbalized understanding. Nothing further needed.

## 2022-11-12 NOTE — Telephone Encounter (Signed)
Called patient and he states that he is having issues trying to sleep. He states he is not sleeping at all. Belsomra is on back order and not able to get the medication. Patient is asking for Ambien to be called in.   Please advise sir

## 2022-11-12 NOTE — Progress Notes (Signed)
Ambien called in in place of Belsomra  Patient message stating Belsomra is on backorder

## 2022-11-12 NOTE — Telephone Encounter (Signed)
Ambien prescription sent in

## 2022-11-18 ENCOUNTER — Encounter: Payer: Self-pay | Admitting: Family Medicine

## 2022-11-18 ENCOUNTER — Ambulatory Visit: Payer: BC Managed Care – PPO | Admitting: Family Medicine

## 2022-11-18 VITALS — BP 136/70 | HR 63 | Temp 97.6°F | Ht 71.0 in | Wt 312.6 lb

## 2022-11-18 DIAGNOSIS — E1142 Type 2 diabetes mellitus with diabetic polyneuropathy: Secondary | ICD-10-CM

## 2022-11-18 DIAGNOSIS — R42 Dizziness and giddiness: Secondary | ICD-10-CM

## 2022-11-18 DIAGNOSIS — R238 Other skin changes: Secondary | ICD-10-CM

## 2022-11-18 DIAGNOSIS — N410 Acute prostatitis: Secondary | ICD-10-CM | POA: Diagnosis not present

## 2022-11-18 DIAGNOSIS — I10 Essential (primary) hypertension: Secondary | ICD-10-CM | POA: Diagnosis not present

## 2022-11-18 DIAGNOSIS — M2550 Pain in unspecified joint: Secondary | ICD-10-CM

## 2022-11-18 MED ORDER — PREGABALIN 200 MG PO CAPS: ORAL_CAPSULE | ORAL | 1 refills | Status: DC
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
## 2022-11-26 ENCOUNTER — Ambulatory Visit: Payer: BC Managed Care – PPO | Admitting: Internal Medicine

## 2022-11-26 ENCOUNTER — Encounter: Payer: Self-pay | Admitting: Internal Medicine

## 2022-11-26 VITALS — BP 130/82 | HR 128 | Ht 71.0 in | Wt 316.0 lb

## 2022-11-26 DIAGNOSIS — E1165 Type 2 diabetes mellitus with hyperglycemia: Secondary | ICD-10-CM | POA: Diagnosis not present

## 2022-11-26 DIAGNOSIS — M9905 Segmental and somatic dysfunction of pelvic region: Secondary | ICD-10-CM | POA: Diagnosis not present

## 2022-11-26 DIAGNOSIS — M9902 Segmental and somatic dysfunction of thoracic region: Secondary | ICD-10-CM | POA: Diagnosis not present

## 2022-11-26 DIAGNOSIS — M9903 Segmental and somatic dysfunction of lumbar region: Secondary | ICD-10-CM | POA: Diagnosis not present

## 2022-11-26 DIAGNOSIS — E1159 Type 2 diabetes mellitus with other circulatory complications: Secondary | ICD-10-CM

## 2022-11-26 DIAGNOSIS — E782 Mixed hyperlipidemia: Secondary | ICD-10-CM | POA: Diagnosis not present

## 2022-11-26 DIAGNOSIS — M9901 Segmental and somatic dysfunction of cervical region: Secondary | ICD-10-CM | POA: Diagnosis not present

## 2022-11-26 LAB — POCT GLYCOSYLATED HEMOGLOBIN (HGB A1C): Hemoglobin A1C: 7.6 % — AB (ref 4.0–5.6)

## 2022-11-26 MED ORDER — FREESTYLE LIBRE 3 SENSOR MISC: 1.0000 | 3 refills | Status: DC

## 2022-11-26 MED ORDER — INSULIN NPH ISOPHANE & REGULAR (70-30) 100 UNIT/ML ~~LOC~~ SUSP: SUBCUTANEOUS | 3 refills | Status: DC

## 2022-11-26 NOTE — Patient Instructions (Addendum)
Please use the following regimen: - NPH/regular 100 units before b'fast and 55 units before dinner  Try to start the Freestyle Libre CGM.  Please return in 3 months with your sugar log.
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
## 2022-12-01 DIAGNOSIS — M9901 Segmental and somatic dysfunction of cervical region: Secondary | ICD-10-CM | POA: Diagnosis not present

## 2022-12-01 DIAGNOSIS — M9905 Segmental and somatic dysfunction of pelvic region: Secondary | ICD-10-CM | POA: Diagnosis not present

## 2022-12-01 DIAGNOSIS — M9903 Segmental and somatic dysfunction of lumbar region: Secondary | ICD-10-CM | POA: Diagnosis not present

## 2022-12-01 DIAGNOSIS — M9902 Segmental and somatic dysfunction of thoracic region: Secondary | ICD-10-CM | POA: Diagnosis not present

## 2022-12-03 ENCOUNTER — Institutional Professional Consult (permissible substitution): Payer: BC Managed Care – PPO | Admitting: Cardiology

## 2022-12-04 DIAGNOSIS — E119 Type 2 diabetes mellitus without complications: Secondary | ICD-10-CM | POA: Diagnosis not present

## 2022-12-04 DIAGNOSIS — E669 Obesity, unspecified: Secondary | ICD-10-CM | POA: Diagnosis not present

## 2022-12-04 DIAGNOSIS — G4733 Obstructive sleep apnea (adult) (pediatric): Secondary | ICD-10-CM | POA: Diagnosis not present

## 2022-12-04 DIAGNOSIS — I10 Essential (primary) hypertension: Secondary | ICD-10-CM | POA: Diagnosis not present

## 2022-12-15 DIAGNOSIS — E119 Type 2 diabetes mellitus without complications: Secondary | ICD-10-CM | POA: Diagnosis not present

## 2022-12-15 DIAGNOSIS — I10 Essential (primary) hypertension: Secondary | ICD-10-CM | POA: Diagnosis not present

## 2022-12-15 DIAGNOSIS — E785 Hyperlipidemia, unspecified: Secondary | ICD-10-CM | POA: Diagnosis not present

## 2022-12-15 DIAGNOSIS — E669 Obesity, unspecified: Secondary | ICD-10-CM | POA: Diagnosis not present

## 2022-12-16 DIAGNOSIS — F4323 Adjustment disorder with mixed anxiety and depressed mood: Secondary | ICD-10-CM | POA: Diagnosis not present

## 2022-12-17 NOTE — Progress Notes (Signed)
Acute Office Visit  Subjective:     Patient ID: Gary Grimes, male    DOB: Feb 10, 1961, 61 y.o.   MRN: [id number]  Chief Complaint  Patient presents with   Cough    Chest congestion, Cough and fatigued x 1 week.  Rx refill on albuterol inhaler and nebulizer    HPI Patient is in today for cough and fatigue for 1 week.   UPPER RESPIRATORY TRACT INFECTION  Fever: no Cough: yes Shortness of breath: yes Wheezing: yes Chest pain: no Chest tightness: yes Chest congestion: yes Nasal congestion: yes Runny nose: yes Post nasal drip: yes Sneezing: no Sore throat: no Swollen glands: no Sinus pressure: yes Headache: no Face pain: no Toothache: no Ear pain: no bilateral Ear pressure: no bilateral Eyes red/itching:no Eye drainage/crusting: no  Vomiting: no Rash: no Fatigue: yes Sick contacts: no Strep contacts: no  Context: worse Recurrent sinusitis: no Relief with OTC cold/cough medications: a little  Treatments attempted: dayquil, nyquil   ROS See pertinent positives and negatives per HPI.     Objective:    BP 132/86 (BP Location: Right Arm, Patient Position: Sitting, Cuff Size: Large)   Pulse 80   Temp 98.7 F (37.1 C) (Temporal)   Wt (!) 319 lb 12.8 oz (145.1 kg)   SpO2 99%   BMI 44.60 kg/m    Physical Exam Vitals and nursing note reviewed.  Constitutional:      Appearance: Normal appearance.  HENT:     Head: Normocephalic.     Right Ear: Tympanic membrane, ear canal and external ear normal.     Left Ear: Tympanic membrane, ear canal and external ear normal.     Nose:     Right Sinus: Maxillary sinus tenderness present. No frontal sinus tenderness.     Left Sinus: Maxillary sinus tenderness present. No frontal sinus tenderness.  Eyes:     Conjunctiva/sclera: Conjunctivae normal.  Cardiovascular:     Rate and Rhythm: Normal rate and regular rhythm.     Pulses: Normal pulses.     Heart sounds: Normal heart sounds.  Pulmonary:     Effort:  Pulmonary effort is normal.     Breath sounds: Wheezing present.  Musculoskeletal:     Cervical back: Normal range of motion and neck supple. No tenderness.  Lymphadenopathy:     Cervical: No cervical adenopathy.  Skin:    General: Skin is warm.  Neurological:     General: No focal deficit present.     Mental Status: He is alert and oriented to person, place, and time.  Psychiatric:        Mood and Affect: Mood normal.        Behavior: Behavior normal.        Thought Content: Thought content normal.        Judgment: Judgment normal.     Results for orders placed or performed in visit on 12/18/22  POC COVID-19  Result Value Ref Range   SARS Coronavirus 2 Ag Negative Negative  POCT Influenza A/B  Result Value Ref Range   Influenza A, POC Negative Negative   Influenza B, POC Negative Negative        Assessment & Plan:   Problem List Items Addressed This Visit   None Visit Diagnoses     Acute cough    -  Primary   Covid and flu negative in office. Will refill promethazine dm as needed for cough. Encourage fluids, rest   Relevant Orders  POC COVID-19 (Completed)   POCT Influenza A/B (Completed)   Acute non-recurrent maxillary sinusitis       Will treat with augmentin BID x10 days. Encourage rest and fluids.   Relevant Medications   amoxicillin-clavulanate (AUGMENTIN) 875-125 MG tablet   promethazine-dextromethorphan (PROMETHAZINE-DM) 6.25-15 MG/5ML syrup   predniSONE (DELTASONE) 10 MG tablet   Bronchitis with acute wheezing       Will refill albuterol nebulizer/inhaler. Albuterol neb given in office with wheezing. Will also treat with prednisone taper. F/U if not improving.   Relevant Medications   albuterol (VENTOLIN HFA) 108 (90 Base) MCG/ACT inhaler   promethazine-dextromethorphan (PROMETHAZINE-DM) 6.25-15 MG/5ML syrup   albuterol (PROVENTIL) (2.5 MG/3ML) 0.083% nebulizer solution 2.5 mg (Completed)       Meds ordered this encounter  Medications    amoxicillin-clavulanate (AUGMENTIN) 875-125 MG tablet    Sig: Take 1 tablet by mouth 2 (two) times daily.    Dispense:  20 tablet    Refill:  0   albuterol (PROVENTIL) (2.5 MG/3ML) 0.083% nebulizer solution    Sig: Take 3 mLs (2.5 mg total) by nebulization every 6 (six) hours as needed for wheezing or shortness of breath.    Dispense:  75 mL    Refill:  0   albuterol (VENTOLIN HFA) 108 (90 Base) MCG/ACT inhaler    Sig: Inhale 1-2 puffs into the lungs every 6 (six) hours as needed for wheezing or shortness of breath.    Dispense:  8 g    Refill:  0   promethazine-dextromethorphan (PROMETHAZINE-DM) 6.25-15 MG/5ML syrup    Sig: Take 5 mLs by mouth 3 (three) times daily as needed for cough.    Dispense:  240 mL    Refill:  0   predniSONE (DELTASONE) 10 MG tablet    Sig: Take 6 tablets today and tomorrow, then 5 tablets the next 2 days, then decrease by 1 tablet every other day until gone    Dispense:  42 tablet    Refill:  0   albuterol (PROVENTIL) (2.5 MG/3ML) 0.083% nebulizer solution 2.5 mg    Return if symptoms worsen or fail to improve.  Charyl Dancer, NP

## 2022-12-18 ENCOUNTER — Encounter: Payer: Self-pay | Admitting: Nurse Practitioner

## 2022-12-18 ENCOUNTER — Ambulatory Visit: Payer: BC Managed Care – PPO | Admitting: Nurse Practitioner

## 2022-12-18 VITALS — BP 132/86 | HR 80 | Temp 98.7°F | Wt 319.8 lb

## 2022-12-18 DIAGNOSIS — J40 Bronchitis, not specified as acute or chronic: Secondary | ICD-10-CM

## 2022-12-18 DIAGNOSIS — R051 Acute cough: Secondary | ICD-10-CM

## 2022-12-18 DIAGNOSIS — J0100 Acute maxillary sinusitis, unspecified: Secondary | ICD-10-CM

## 2022-12-18 LAB — POC COVID19 BINAXNOW: SARS Coronavirus 2 Ag: NEGATIVE

## 2022-12-18 LAB — POCT INFLUENZA A/B
Influenza A, POC: NEGATIVE
Influenza B, POC: NEGATIVE

## 2022-12-18 MED ORDER — ALBUTEROL SULFATE (2.5 MG/3ML) 0.083% IN NEBU
2.5000 mg | INHALATION_SOLUTION | Freq: Once | RESPIRATORY_TRACT | Status: AC
  Administered 2022-12-18: 2.5 mg via RESPIRATORY_TRACT

## 2022-12-18 MED ORDER — PREDNISONE 10 MG PO TABS: ORAL_TABLET | ORAL | 0 refills | Status: DC

## 2022-12-18 MED ORDER — ALBUTEROL SULFATE (2.5 MG/3ML) 0.083% IN NEBU: 2.5000 mg | INHALATION_SOLUTION | Freq: Four times a day (QID) | RESPIRATORY_TRACT | 0 refills | Status: DC | PRN

## 2022-12-18 MED ORDER — AMOXICILLIN-POT CLAVULANATE 875-125 MG PO TABS: 1.0000 | ORAL_TABLET | Freq: Two times a day (BID) | ORAL | 0 refills | Status: DC

## 2022-12-18 MED ORDER — PROMETHAZINE-DM 6.25-15 MG/5ML PO SYRP: 5.0000 mL | ORAL_SOLUTION | Freq: Three times a day (TID) | ORAL | 0 refills | Status: DC | PRN

## 2022-12-18 MED ORDER — ALBUTEROL SULFATE HFA 108 (90 BASE) MCG/ACT IN AERS: 1.0000 | INHALATION_SPRAY | Freq: Four times a day (QID) | RESPIRATORY_TRACT | 0 refills | Status: DC | PRN

## 2022-12-18 NOTE — Patient Instructions (Signed)
It was great to see you!  Start augmentin (antibiotic) 1 tablet twice a day with food for 10 days.  Start prednisone tablets in the morning with food. Take 6 tablets today and tomorrow, 5 tablets the next 2 days, then keep decreasing by 1 tablet every other day until gone. (6, 6, 5, 5, 4, 4, 3, 3, 2, 2, 1, 1)  I have refilled your cough medication and your albuterol inhaler and nebulizer.   Let's follow-up if your symptoms worsen or don't improve.   Take care,  Vance Peper, NP

## 2022-12-22 ENCOUNTER — Encounter: Payer: Self-pay | Admitting: Nurse Practitioner

## 2022-12-22 ENCOUNTER — Emergency Department (HOSPITAL_COMMUNITY): Payer: BC Managed Care – PPO

## 2022-12-22 ENCOUNTER — Emergency Department (HOSPITAL_COMMUNITY)
Admission: EM | Admit: 2022-12-22 | Discharge: 2022-12-22 | Discharge status: Against Medical Advice | Payer: BC Managed Care – PPO | Attending: Emergency Medicine | Admitting: Emergency Medicine

## 2022-12-22 ENCOUNTER — Telehealth: Payer: Self-pay | Admitting: Family Medicine

## 2022-12-22 ENCOUNTER — Ambulatory Visit: Payer: BC Managed Care – PPO | Admitting: Nurse Practitioner

## 2022-12-22 VITALS — BP 126/76 | HR 63 | Temp 97.5°F | Ht 71.0 in | Wt 313.4 lb

## 2022-12-22 DIAGNOSIS — J40 Bronchitis, not specified as acute or chronic: Secondary | ICD-10-CM

## 2022-12-22 DIAGNOSIS — Z5321 Procedure and treatment not carried out due to patient leaving prior to being seen by health care provider: Secondary | ICD-10-CM | POA: Diagnosis not present

## 2022-12-22 DIAGNOSIS — Z1152 Encounter for screening for COVID-19: Secondary | ICD-10-CM | POA: Insufficient documentation

## 2022-12-22 DIAGNOSIS — J101 Influenza due to other identified influenza virus with other respiratory manifestations: Secondary | ICD-10-CM | POA: Diagnosis not present

## 2022-12-22 DIAGNOSIS — R059 Cough, unspecified: Secondary | ICD-10-CM | POA: Diagnosis not present

## 2022-12-22 DIAGNOSIS — J189 Pneumonia, unspecified organism: Secondary | ICD-10-CM | POA: Diagnosis not present

## 2022-12-22 DIAGNOSIS — R0602 Shortness of breath: Secondary | ICD-10-CM | POA: Diagnosis not present

## 2022-12-22 HISTORY — DX: Bronchitis, not specified as acute or chronic: J40

## 2022-12-22 LAB — RESP PANEL BY RT-PCR (RSV, FLU A&B, COVID)  RVPGX2
Influenza A by PCR: POSITIVE — AB
Influenza B by PCR: NEGATIVE
Resp Syncytial Virus by PCR: NEGATIVE
SARS Coronavirus 2 by RT PCR: NEGATIVE

## 2022-12-22 MED ORDER — ALBUTEROL SULFATE (2.5 MG/3ML) 0.083% IN NEBU
2.5000 mg | INHALATION_SOLUTION | Freq: Once | RESPIRATORY_TRACT | Status: AC
  Administered 2022-12-22: 2.5 mg via RESPIRATORY_TRACT

## 2022-12-22 MED ORDER — PROMETHAZINE-DM 6.25-15 MG/5ML PO SYRP: 5.0000 mL | ORAL_SOLUTION | Freq: Three times a day (TID) | ORAL | 0 refills | Status: DC | PRN

## 2022-12-22 NOTE — ED Triage Notes (Signed)
Picked up by EMS from urgent care for cough since Tuesday. Dx Bronchitis x 3 days Prednisone and Augmentin worsening cough. Albuterol given at urgent care

## 2022-12-22 NOTE — Telephone Encounter (Signed)
Caller Name: Gary Grimes Call back phone #: [phone]  Reason for Call: Pt was seen 12/22 by Lauren. He called again to be scheduled day of 12/26 after not feeling any better. Lauren had the only opening, he was picked up in the ambulance from the office. Wait time at [facility] is 18 hours. Pt is unable to wait and is asking for recommendations for f/u care.

## 2022-12-22 NOTE — ED Notes (Signed)
Pt turned labels in to registration at 1547 and left.

## 2022-12-22 NOTE — Progress Notes (Signed)
   Acute Office Visit  Subjective:     Patient ID: Gary Grimes, male    DOB: 02-26-1961, 61 y.o.   MRN: [id number]  Chief Complaint  Patient presents with   Office visit    F/u visit from Friday      HPI Patient is in today for ongoing cough, congestion, and shortness of breath. He has been taking the prednisone and augmentin, but doesn't feel like they are helping. He states he his starting to feel disoriented. He has been doing his nebulizers, but not as frequent as he should.   ROS See pertinent positives and negatives per HPI.     Objective:    BP 126/76 (BP Location: Left Arm, Patient Position: Sitting, Cuff Size: Normal)   Pulse 63   Temp (!) 97.5 F (36.4 C) (Temporal)   Ht '5\' 11"'$  (1.803 m)   Wt (!) 313 lb 6.4 oz (142.2 kg)   SpO2 97%   BMI 43.71 kg/m    Physical Exam Vitals and nursing note reviewed.  Constitutional:      Appearance: Normal appearance.  HENT:     Head: Normocephalic.  Eyes:     Conjunctiva/sclera: Conjunctivae normal.  Cardiovascular:     Rate and Rhythm: Normal rate and regular rhythm.     Pulses: Normal pulses.     Heart sounds: Normal heart sounds.  Pulmonary:     Effort: Pulmonary effort is normal.     Breath sounds: Wheezing and rhonchi present.  Musculoskeletal:     Cervical back: Normal range of motion.  Skin:    General: Skin is warm.  Neurological:     General: No focal deficit present.     Mental Status: He is alert and oriented to person, place, and time.  Psychiatric:        Mood and Affect: Mood normal.        Behavior: Behavior normal.        Thought Content: Thought content normal.        Judgment: Judgment normal.      Assessment & Plan:   Problem List Items Addressed This Visit       Respiratory   Bronchitis with acute wheezing - Primary    He is still having significant coughing spells and shortness of breath despite the prednisone and augmentin. His oxygen saturation is 97%. Lungs sounds with wheezing  and rhonchii. Discussed going to ER and he opts for going via ambulance. 911 called, albuterol nebulizer given while waiting for ambulance. Follow-up with PCP on discharge from hospital.        Meds ordered this encounter  Medications   albuterol (PROVENTIL) (2.5 MG/3ML) 0.083% nebulizer solution 2.5 mg    No follow-ups on file.  Charyl Dancer, NP

## 2022-12-22 NOTE — Assessment & Plan Note (Signed)
He is still having significant coughing spells and shortness of breath despite the prednisone and augmentin. His oxygen saturation is 97%. Lungs sounds with wheezing and rhonchii. Discussed going to ER and he opts for going via ambulance. 911 called, albuterol nebulizer given while waiting for ambulance. Follow-up with PCP on discharge from hospital.

## 2022-12-22 NOTE — Patient Instructions (Signed)
We did an albuterol nebulizer in the office today.   He was treated with prednisone and augmentin outpatient.   Recommend ER visit with ongoing shortness of breath

## 2022-12-22 NOTE — ED Provider Triage Note (Signed)
Emergency Medicine Provider Triage Evaluation Note  Gary Grimes , a 61 y.o. male  was evaluated in triage.  Pt complains of cough and shortness of breath.  Has been present for 3 days.  Was started on prednisone, Augmentin and promethazine with no relief.  Presents urgent care today to noted continued cough and wheezing and encouraged patient to come to the ED for further evaluation.  Was given an albuterol inhaler while awaiting ambulance at urgent care lung sounds were not reassessed.  Endorses body aches.  Denies fevers, chills, chest pain  Review of Systems  Positive: As above Negative: As above  Physical Exam  BP (!) 156/76   Pulse 63   Temp 98.5 F (36.9 C) (Oral)   Resp 18   SpO2 97%  Gen:   Awake, no distress   Resp:  Normal effort persistent cough mild expiratory wheezing in lower lobes MSK:   Moves extremities without difficulty  Other:    Medical Decision Making  Medically screening exam initiated at 12:25 PM.  Appropriate orders placed.  Gary Grimes was informed that the remainder of the evaluation will be completed by another provider, this initial triage assessment does not replace that evaluation, and the importance of remaining in the ED until their evaluation is complete.     Roylene Reason, [place] 12/22/22 1226

## 2022-12-22 NOTE — Telephone Encounter (Signed)
Spoke to patient, he has been at the hospital and has had an X-ray done (in Chart),; states that there is a 21 hour wait to be seen at the hospital and he can't sit there that long. He will go home, continue antibotics, use the nebulizer machine.  He will need a refill on the cough medication ( almost out).  Please review and advise.   Thanks.  Dm/cma

## 2022-12-23 NOTE — Telephone Encounter (Signed)
Patient notified VIA phone that RX was sent into pharmacy yesterday. Dm/cma

## 2022-12-23 NOTE — Telephone Encounter (Signed)
Pt is checking on status of this message. He is needing a refill on this cough syrup.   [id number] [address]: [phone] Fax: [phone] #: [id number]

## 2022-12-24 ENCOUNTER — Encounter: Payer: Self-pay | Admitting: Pulmonary Disease

## 2022-12-24 ENCOUNTER — Telehealth: Payer: Self-pay

## 2022-12-24 ENCOUNTER — Ambulatory Visit: Payer: BC Managed Care – PPO | Admitting: Pulmonary Disease

## 2022-12-24 DIAGNOSIS — F5104 Psychophysiologic insomnia: Secondary | ICD-10-CM | POA: Diagnosis not present

## 2022-12-24 MED ORDER — DOXYCYCLINE HYCLATE 100 MG PO TABS: 100.0000 mg | ORAL_TABLET | Freq: Two times a day (BID) | ORAL | 0 refills | Status: DC

## 2022-12-24 MED ORDER — BELSOMRA 20 MG PO TABS: 20.0000 | ORAL_TABLET | Freq: Every day | ORAL | 5 refills | Status: DC

## 2022-12-24 MED ORDER — PREDNISONE 20 MG PO TABS: 40.0000 mg | ORAL_TABLET | Freq: Every day | ORAL | 0 refills | Status: DC
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
## 2022-12-24 NOTE — Patient Instructions (Signed)
Prescription for doxycycline sent to pharmacy for you  Prescription for prednisone for 7 days  I did renew your prescription for Belsomra  Continue using your CPAP nightly  Call us with significant concerns  Follow-up in 6 months

## 2022-12-24 NOTE — Telephone Encounter (Signed)
Transition Care Management Follow-up Telephone Call Date of discharge and from where: 12/22/22 [facility] ED. Dx: left AMA How have you been since you were released from the hospital? I'm doing better, but still pretty sick Any questions or concerns? No  Items Reviewed: Did the pt receive and understand the discharge instructions provided?  Pt left before evaluation Medications obtained and verified? No  Other? No  Any new allergies since your discharge? No  Dietary orders reviewed? No Do you have support at home? Yes    Functional Questionnaire: (I = Independent and D = Dependent) ADLs: I  Bathing/Dressing- I  Meal Prep- I  Eating- I  Maintaining continence- I  Transferring/Ambulation- I  Managing Meds- I  Follow up appointments reviewed:  PCP Hospital f/u appt confirmed? No  Scheduled to see N/A on N/A @ N/A. [facility] f/u appt confirmed? Yes  Scheduled to see Dr. Ander Slade on 12/24/22 @ 1:45pm. Are transportation arrangements needed? No  If their condition worsens, is the pt aware to call PCP or go to the Emergency Dept.? Yes Was the patient provided with contact information for the PCP's office or ED? Yes Was to pt encouraged to call back with questions or concerns? Yes  Angeline Slim, RN, BSN RN Clinical Supervisor [facility] [employer]

## 2022-12-25 ENCOUNTER — Telehealth: Payer: Self-pay | Admitting: Pulmonary Disease

## 2022-12-25 NOTE — Telephone Encounter (Signed)
Spoke with pt who stated he started Dr. Judson Roch prednisone order (40 mg) today instead of finishing the taper (20 mg today and 10 mg tomorrow he was given by PCP on 12/18/22. Pt states that he saw PCP on 12/19/22 r/t cough and wheezing. He was given Promethazine cough syrup and original prednisone taper. Pt state cough syrup is not helping cough. Pt stated he went to ED on Tuesday night but left after being told there was a 21 hour wait time. Dr. Verlee Monte can you please advise as Dr. Ander Slade is unavailable today.

## 2022-12-25 NOTE — Telephone Encounter (Signed)
Called and spoke with patient, advised of Dr. Glenetta Hew recommendations. Patient verbalized understanding. Nothing further needed.

## 2022-12-25 NOTE — Telephone Encounter (Signed)
OK to begin Dr. Colman Cater prednisone course today. As long as his total prednisone course is 3 weeks or so there is no need for taper. Needs to watch his blood sugar while on high steroid dose.

## 2023-01-06 DIAGNOSIS — I10 Essential (primary) hypertension: Secondary | ICD-10-CM | POA: Diagnosis not present

## 2023-01-06 DIAGNOSIS — E669 Obesity, unspecified: Secondary | ICD-10-CM | POA: Diagnosis not present

## 2023-01-06 DIAGNOSIS — E785 Hyperlipidemia, unspecified: Secondary | ICD-10-CM | POA: Diagnosis not present

## 2023-01-06 DIAGNOSIS — E119 Type 2 diabetes mellitus without complications: Secondary | ICD-10-CM | POA: Diagnosis not present

## 2023-01-12 ENCOUNTER — Telehealth: Payer: Self-pay | Admitting: Family Medicine

## 2023-01-12 DIAGNOSIS — E119 Type 2 diabetes mellitus without complications: Secondary | ICD-10-CM | POA: Diagnosis not present

## 2023-01-12 DIAGNOSIS — E785 Hyperlipidemia, unspecified: Secondary | ICD-10-CM | POA: Diagnosis not present

## 2023-01-12 DIAGNOSIS — E669 Obesity, unspecified: Secondary | ICD-10-CM | POA: Diagnosis not present

## 2023-01-12 DIAGNOSIS — I10 Essential (primary) hypertension: Secondary | ICD-10-CM | POA: Diagnosis not present

## 2023-01-12 NOTE — Telephone Encounter (Signed)
Caller Name: [name] Call back phone #: [phone]  Reason for Call: pt called stating he is diabetic. Morning blood sugars average 150. He is wanting to try 16 hour intermittent fasting and asking if that would be advised.

## 2023-01-13 NOTE — Telephone Encounter (Signed)
Spoke to patient and he will call his Endocrinologist.  Dm/cma

## 2023-01-14 ENCOUNTER — Telehealth: Payer: Self-pay | Admitting: Family Medicine

## 2023-01-14 DIAGNOSIS — F411 Generalized anxiety disorder: Secondary | ICD-10-CM | POA: Diagnosis not present

## 2023-01-14 NOTE — Telephone Encounter (Signed)
Caller Name: [place] with [employer]) Call back phone #: [phone]  Reason for Call: faxing request for recent BMP and Lipid results to [phone]

## 2023-01-18 ENCOUNTER — Telehealth: Payer: Self-pay

## 2023-01-18 NOTE — Telephone Encounter (Signed)
Pt is starting a weight-loss program and wants to know if he can go on a 16 hour fast? He is currently taking 100 units in the am and 55 units in the evening. What adjustments may be needed.

## 2023-01-18 NOTE — Telephone Encounter (Signed)
Faxed to them on 01/15/23.Marland Kitchen  Dm/cma

## 2023-01-19 ENCOUNTER — Telehealth: Payer: Self-pay | Admitting: Family Medicine

## 2023-01-19 ENCOUNTER — Ambulatory Visit: Payer: BC Managed Care – PPO | Admitting: Family Medicine

## 2023-01-19 ENCOUNTER — Encounter: Payer: Self-pay | Admitting: Family Medicine

## 2023-01-19 VITALS — BP 126/62 | HR 64 | Temp 97.9°F | Ht 71.0 in | Wt 316.2 lb

## 2023-01-19 DIAGNOSIS — Z6841 Body Mass Index (BMI) 40.0 and over, adult: Secondary | ICD-10-CM | POA: Diagnosis not present

## 2023-01-19 DIAGNOSIS — F900 Attention-deficit hyperactivity disorder, predominantly inattentive type: Secondary | ICD-10-CM | POA: Diagnosis not present

## 2023-01-19 MED ORDER — ATOMOXETINE HCL 40 MG PO CAPS: ORAL_CAPSULE | ORAL | 2 refills | Status: DC

## 2023-01-19 NOTE — Telephone Encounter (Signed)
Pt was seen today 01/19/23 and was prescribed atomoxetine (STRATTERA) 40 MG capsule [[id number]]. His insurance company let him know he will need a PA with this script. It's going to  cost him #100 without the PA.  [id number] [id number]: [phone] Fax: [phone] #: [id number]  Pt at [phone]

## 2023-01-19 NOTE — Telephone Encounter (Signed)
Mychart message sent to pt for follow up.
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
## 2023-01-21 ENCOUNTER — Other Ambulatory Visit (HOSPITAL_COMMUNITY): Payer: Self-pay

## 2023-01-21 ENCOUNTER — Ambulatory Visit (INDEPENDENT_AMBULATORY_CARE_PROVIDER_SITE_OTHER): Payer: BC Managed Care – PPO

## 2023-01-21 ENCOUNTER — Ambulatory Visit: Payer: BC Managed Care – PPO | Admitting: Podiatry

## 2023-01-21 ENCOUNTER — Telehealth: Payer: Self-pay

## 2023-01-21 DIAGNOSIS — M7752 Other enthesopathy of left foot: Secondary | ICD-10-CM | POA: Diagnosis not present

## 2023-01-21 DIAGNOSIS — R52 Pain, unspecified: Secondary | ICD-10-CM

## 2023-01-21 DIAGNOSIS — M2032 Hallux varus (acquired), left foot: Secondary | ICD-10-CM | POA: Diagnosis not present

## 2023-01-21 DIAGNOSIS — M79672 Pain in left foot: Secondary | ICD-10-CM | POA: Diagnosis not present

## 2023-01-21 MED ORDER — TRIAMCINOLONE ACETONIDE 10 MG/ML IJ SUSP
10.0000 mg | Freq: Once | INTRAMUSCULAR | Status: AC
  Administered 2023-01-21: 10 mg

## 2023-01-21 NOTE — Patient Instructions (Signed)

## 2023-01-21 NOTE — Telephone Encounter (Signed)
Pharmacy Patient Advocate Encounter   Received notification that prior authorization for Atomoxetine HCl '40MG'$  capsules is required/requested.  Per Test Claim: non-formulary   PA submitted on 01/21/23 to (ins) [employer] via CoverMyMeds Key [id number] Status is pending

## 2023-01-21 NOTE — Telephone Encounter (Signed)
Pa pending. Key [id number]. Will update

## 2023-01-21 NOTE — Progress Notes (Signed)
Subjective: Chief Complaint  Patient presents with   Foot Pain    Right foot, mid foot     62 year old male presents the office with above concerns.  He has constant pain to his big toe joint that goes up to the top and middle of his foot. This started about 2 weeks ago but really has been getting worse. No injuries. Hurts all of the time. No swelling or redness. He takes arthritis medication and that is not helping.   Last A1c 7.6 on 11/26/2022  Objective: AAO x3, NAD DP/PT pulses palpable bilaterally, CRT less than 3 seconds There is palpation along the right first MPJ and this is seen swelling is where this pain starts.  Has Some discomfort along the top of his foot there is no other areas of pinpoint tenderness.  There is edema present along the first MPJ.  No other areas of edema.  Flexor, sensory tendons appear to be intact.  Hallux malleus noted.  MMT 5/5. No pain with calf compression, swelling, warmth, erythema  Assessment: Capsulitis first MTPJ right foot  Plan: -All treatment options discussed with the patient including all alternatives, risks, complications.  -X-rays were obtained reviewed.  3 views of the foot were obtained.  No evidence of acute fracture.  Hallux malleus noted. -Steroid injection performed the first MPJ.  Skin was cleaned Betadine, alcohol mixture 1 cc Kenalog 10, 0.5 cc of Marcaine plain, 0.5 cc of lidocaine plain was infiltrated into the first MPJ without complications.  Postinjection care discussed.  Tolerated procedure well any complications. -Discussion modifications and good arch support. -Offloading - -Patient encouraged to call the office with any questions, concerns, change in symptoms.

## 2023-01-22 ENCOUNTER — Ambulatory Visit: Payer: BC Managed Care – PPO | Admitting: Family Medicine

## 2023-01-28 DIAGNOSIS — F4323 Adjustment disorder with mixed anxiety and depressed mood: Secondary | ICD-10-CM | POA: Diagnosis not present

## 2023-01-28 DIAGNOSIS — E119 Type 2 diabetes mellitus without complications: Secondary | ICD-10-CM | POA: Diagnosis not present

## 2023-01-28 LAB — HM DIABETES EYE EXAM

## 2023-02-03 NOTE — Telephone Encounter (Signed)
Pharmacy Patient Advocate Encounter  Received notification from Clinical services team pharmacy services dept that the request for prior authorization for Atomoxetine has been denied due to patient will need to try and fail one other formulary alternative. Provider can send a statement explaining why patient cannot take alternative drug(s).   Please be advised we currently do not have a Pharmacist to review denials, therefore you will need to process appeals accordingly as needed. Thanks for your support at this time.   You may call [phone] or fax [phone], to appeal.   Denial letter is attached to charts

## 2023-02-03 NOTE — Telephone Encounter (Signed)
Pharmacy Patient Advocate Encounter  Received notification from Clinical services team pharmacy services dept that the request for prior authorization for Atomoxetine has been denied due to patient will need to try and fail one other formulary alternative. Provider can send a statement explaining why patient cannot take alternative drug(s).   Please be advised we currently do not have a Pharmacist to review denials, therefore you will need to process appeals accordingly as needed. Thanks for your support at this time.   You may call [phone] or fax [phone], to appeal.   Denial letter is attached to charts

## 2023-02-08 NOTE — Telephone Encounter (Signed)
Appeals letter/OV note/denial letter faxed to [facility] team @ [phone].  Awaiting response. Dm/cma

## 2023-02-10 ENCOUNTER — Other Ambulatory Visit: Payer: Self-pay | Admitting: Family Medicine

## 2023-02-10 DIAGNOSIS — F900 Attention-deficit hyperactivity disorder, predominantly inattentive type: Secondary | ICD-10-CM

## 2023-02-11 ENCOUNTER — Other Ambulatory Visit (HOSPITAL_COMMUNITY): Payer: Self-pay

## 2023-02-11 NOTE — Telephone Encounter (Signed)
Patient Advocate Encounter  Appeal for Atomoxetine 24m has been approved.    Ref# [id number] dates: 12/10/22 through 02/10/24

## 2023-02-12 ENCOUNTER — Other Ambulatory Visit (HOSPITAL_COMMUNITY): Payer: Self-pay

## 2023-02-15 ENCOUNTER — Telehealth: Payer: Self-pay | Admitting: Pulmonary Disease

## 2023-02-15 NOTE — Telephone Encounter (Signed)
pt. called because [facility] percer[facility] for Suvorexant (BELSOMRA) 20 MG TABS the (TAB) need to say (mgs) [facility] its [facility] he is completely out and needs it called in

## 2023-02-15 NOTE — Telephone Encounter (Signed)
pt. calling back he needs meds called in can't sleep with out med is totally out

## 2023-02-16 ENCOUNTER — Other Ambulatory Visit: Payer: Self-pay | Admitting: Pulmonary Disease

## 2023-02-16 DIAGNOSIS — F5104 Psychophysiologic insomnia: Secondary | ICD-10-CM

## 2023-02-16 MED ORDER — BELSOMRA 20 MG PO TABS: 20.0000 mg | ORAL_TABLET | Freq: Every day | ORAL | 3 refills | Status: DC

## 2023-02-16 NOTE — Telephone Encounter (Signed)
Notified pt via detailed voicemail (per DPR) that Belsomra refill had been sent in and to contact office for any other questions. Nothing further needed at this time.

## 2023-02-16 NOTE — Telephone Encounter (Signed)
Belsomra called into pharmacy, 30 days with 3 refills

## 2023-02-16 NOTE — Progress Notes (Signed)
Belsomra called to pharmacy, 30 days with 3 refills

## 2023-02-18 ENCOUNTER — Ambulatory Visit: Payer: BC Managed Care – PPO | Admitting: Family Medicine

## 2023-02-18 ENCOUNTER — Encounter: Payer: Self-pay | Admitting: Family Medicine

## 2023-02-18 VITALS — BP 132/78 | HR 90 | Temp 98.1°F | Ht 71.0 in | Wt 320.2 lb

## 2023-02-18 DIAGNOSIS — F509 Eating disorder, unspecified: Secondary | ICD-10-CM | POA: Diagnosis not present

## 2023-02-18 DIAGNOSIS — F319 Bipolar disorder, unspecified: Secondary | ICD-10-CM

## 2023-02-18 DIAGNOSIS — Z125 Encounter for screening for malignant neoplasm of prostate: Secondary | ICD-10-CM | POA: Diagnosis not present

## 2023-02-18 DIAGNOSIS — E1142 Type 2 diabetes mellitus with diabetic polyneuropathy: Secondary | ICD-10-CM

## 2023-02-18 DIAGNOSIS — E1140 Type 2 diabetes mellitus with diabetic neuropathy, unspecified: Secondary | ICD-10-CM | POA: Diagnosis not present

## 2023-02-18 DIAGNOSIS — Z6841 Body Mass Index (BMI) 40.0 and over, adult: Secondary | ICD-10-CM

## 2023-02-18 DIAGNOSIS — Z794 Long term (current) use of insulin: Secondary | ICD-10-CM

## 2023-02-18 DIAGNOSIS — I10 Essential (primary) hypertension: Secondary | ICD-10-CM | POA: Diagnosis not present

## 2023-02-18 DIAGNOSIS — F900 Attention-deficit hyperactivity disorder, predominantly inattentive type: Secondary | ICD-10-CM

## 2023-02-18 LAB — MICROALBUMIN / CREATININE URINE RATIO
Creatinine,U: 60.2 mg/dL
Microalb Creat Ratio: 1.2 mg/g (ref 0.0–30.0)
Microalb, Ur: <0.7 mg/dL (ref 0.0–1.9)

## 2023-02-18 LAB — HEMOGLOBIN A1C: Hgb A1c MFr Bld: 8.5 % — ABNORMAL HIGH (ref 4.6–6.5)

## 2023-02-18 LAB — PSA: PSA: 0.41 ng/mL (ref 0.10–4.00)

## 2023-02-18 MED ORDER — PREGABALIN 200 MG PO CAPS: ORAL_CAPSULE | ORAL | 1 refills | Status: DC

## 2023-02-18 MED ORDER — OLMESARTAN MEDOXOMIL 20 MG PO TABS: 30.0000 mg | ORAL_TABLET | Freq: Every day | ORAL | 1 refills | Status: DC
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
## 2023-02-19 ENCOUNTER — Telehealth: Payer: Self-pay | Admitting: Family Medicine

## 2023-02-19 MED ORDER — PIOGLITAZONE HCL 15 MG PO TABS: 15.0000 mg | ORAL_TABLET | Freq: Every day | ORAL | 3 refills | Status: DC

## 2023-02-19 NOTE — Addendum Note (Signed)
Addended by: Haydee Salter on: 02/19/2023 12:53 PM   Modules accepted: Orders

## 2023-02-19 NOTE — Telephone Encounter (Signed)
Spoke to Gary Grimes, he states that he wants to wean off the [name] due to weight gain. His psychiatrist is dragging her feet.  Can you help with this? Thanks. Dm/cma

## 2023-02-19 NOTE — Telephone Encounter (Signed)
Gary Grimes has been taking [name] and would like to wean off. He would like Dr Rudd's advice on this.

## 2023-02-23 ENCOUNTER — Telehealth: Payer: Self-pay | Admitting: Family Medicine

## 2023-02-23 NOTE — Telephone Encounter (Signed)
Pt called and would like a return call . Pt stated he have questions about the medication you put him on called actos he said

## 2023-02-23 NOTE — Telephone Encounter (Signed)
error 

## 2023-02-25 ENCOUNTER — Encounter: Payer: Self-pay | Admitting: Radiology

## 2023-02-25 ENCOUNTER — Encounter: Payer: Self-pay | Admitting: Internal Medicine

## 2023-02-25 ENCOUNTER — Ambulatory Visit: Payer: BC Managed Care – PPO | Admitting: Internal Medicine

## 2023-02-25 VITALS — BP 124/76 | HR 78 | Ht 71.0 in | Wt 323.6 lb

## 2023-02-25 DIAGNOSIS — E1165 Type 2 diabetes mellitus with hyperglycemia: Secondary | ICD-10-CM

## 2023-02-25 DIAGNOSIS — Z6841 Body Mass Index (BMI) 40.0 and over, adult: Secondary | ICD-10-CM

## 2023-02-25 DIAGNOSIS — E1159 Type 2 diabetes mellitus with other circulatory complications: Secondary | ICD-10-CM | POA: Diagnosis not present

## 2023-02-25 DIAGNOSIS — E782 Mixed hyperlipidemia: Secondary | ICD-10-CM | POA: Diagnosis not present

## 2023-02-25 MED ORDER — HUMULIN R U-500 KWIKPEN 500 UNIT/ML ~~LOC~~ SOPN: PEN_INJECTOR | SUBCUTANEOUS | 3 refills | Status: DC

## 2023-02-25 MED ORDER — FREESTYLE LIBRE 2 SENSOR MISC: 3 refills | Status: DC

## 2023-02-25 MED ORDER — INSULIN PEN NEEDLE 32G X 4 MM MISC: 3 refills | Status: DC

## 2023-02-25 MED ORDER — FREESTYLE LIBRE 2 READER DEVI: 0 refills | Status: DC

## 2023-02-25 NOTE — Telephone Encounter (Signed)
Patient notified VIA phone and will continue to talk to his psychiatrist about this.  Dm/cma

## 2023-02-25 NOTE — Telephone Encounter (Signed)
FYI:  Patient seen the Endocrinologist today and they have stopped Actos and placed him on a different insulin regiment today.  Patient says thank you for your help with his diabetes. Dm/cma
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
## 2023-02-25 NOTE — Patient Instructions (Addendum)
Please stop the 70/30 insulin and start U500 - 30 min before the meals: - 60-70 units before b'fast - 40-50 units before dinner  Please return in 1.5 months with your sugar log.

## 2023-03-02 ENCOUNTER — Telehealth: Payer: Self-pay | Admitting: Orthopedic Surgery

## 2023-03-02 NOTE — Telephone Encounter (Signed)
Returned the patient's call.  Gary Grimes hurt his left knee in the gym.  Gary Grimes only wanted Dr. Aline Brochure.  Dr. Ruthe Mannan schedule is full and Gary Grimes will not be in the office some.  Offered multiple times to schedule him w/one of our other providers and Gary Grimes declined.  Stated Gary Grimes will go to [place].

## 2023-03-03 ENCOUNTER — Encounter: Payer: Self-pay | Admitting: Physician Assistant

## 2023-03-03 ENCOUNTER — Ambulatory Visit (INDEPENDENT_AMBULATORY_CARE_PROVIDER_SITE_OTHER): Payer: BC Managed Care – PPO | Admitting: Physician Assistant

## 2023-03-03 ENCOUNTER — Ambulatory Visit (INDEPENDENT_AMBULATORY_CARE_PROVIDER_SITE_OTHER): Payer: BC Managed Care – PPO

## 2023-03-03 DIAGNOSIS — M25562 Pain in left knee: Secondary | ICD-10-CM

## 2023-03-03 MED ORDER — BUPIVACAINE HCL 0.25 % IJ SOLN
1.0000 mL | INTRAMUSCULAR | Status: AC | PRN
  Administered 2023-03-03: 1 mL via INTRA_ARTICULAR

## 2023-03-03 MED ORDER — METHYLPREDNISOLONE ACETATE 40 MG/ML IJ SUSP
80.0000 mg | INTRAMUSCULAR | Status: AC | PRN
  Administered 2023-03-03: 80 mg via INTRA_ARTICULAR

## 2023-03-03 MED ORDER — LIDOCAINE HCL 1 % IJ SOLN
3.0000 mL | INTRAMUSCULAR | Status: AC | PRN
  Administered 2023-03-03: 3 mL

## 2023-03-03 MED ORDER — MELOXICAM 7.5 MG PO TABS: 7.5000 mg | ORAL_TABLET | Freq: Every day | ORAL | 0 refills | Status: DC
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
## 2023-03-09 DIAGNOSIS — I10 Essential (primary) hypertension: Secondary | ICD-10-CM | POA: Diagnosis not present

## 2023-03-09 DIAGNOSIS — E669 Obesity, unspecified: Secondary | ICD-10-CM | POA: Diagnosis not present

## 2023-03-09 DIAGNOSIS — Z724 Inappropriate diet and eating habits: Secondary | ICD-10-CM | POA: Diagnosis not present

## 2023-03-09 DIAGNOSIS — Z713 Dietary counseling and surveillance: Secondary | ICD-10-CM | POA: Diagnosis not present

## 2023-03-09 DIAGNOSIS — G4733 Obstructive sleep apnea (adult) (pediatric): Secondary | ICD-10-CM | POA: Diagnosis not present

## 2023-03-09 DIAGNOSIS — E119 Type 2 diabetes mellitus without complications: Secondary | ICD-10-CM | POA: Diagnosis not present

## 2023-03-11 DIAGNOSIS — F509 Eating disorder, unspecified: Secondary | ICD-10-CM | POA: Diagnosis not present

## 2023-03-12 ENCOUNTER — Telehealth: Payer: Self-pay

## 2023-03-12 NOTE — Telephone Encounter (Signed)
Pt called to advies his sugars have spiked to 270 at 10:45 this morning. Pt is not feeling well.

## 2023-03-12 NOTE — Telephone Encounter (Signed)
Pt contacted and confirmed he is taking 70 units with b'fast and 50 units with dinner. Pt advised his sugars have been running high for a while. Pt was advised he could increase his dose by 10 units before meals to help bring his sugars down. Pt advised his fasting sugars have been in the 200's. Advised he can increase his dose by 10-20 units and if after a few days its not helping to call back to let us know how his blood sugars are doing.

## 2023-03-15 ENCOUNTER — Ambulatory Visit: Payer: BC Managed Care – PPO | Admitting: Orthopedic Surgery

## 2023-03-15 ENCOUNTER — Encounter: Payer: Self-pay | Admitting: Orthopedic Surgery

## 2023-03-15 VITALS — BP 170/92 | Ht 71.0 in | Wt 320.0 lb

## 2023-03-15 DIAGNOSIS — S76302A Unspecified injury of muscle, fascia and tendon of the posterior muscle group at thigh level, left thigh, initial encounter: Secondary | ICD-10-CM

## 2023-03-15 MED ORDER — METHOCARBAMOL 750 MG PO TABS: 750.0000 mg | ORAL_TABLET | Freq: Four times a day (QID) | ORAL | 2 refills | Status: DC

## 2023-03-15 MED ORDER — IBUPROFEN 800 MG PO TABS: 800.0000 mg | ORAL_TABLET | Freq: Three times a day (TID) | ORAL | 1 refills | Status: DC | PRN

## 2023-03-15 NOTE — Patient Instructions (Addendum)
Physical therapy has been ordered for you at [facility] They should call you to schedule, [phone]  is the phone number to call, if you want to call to schedule will be quicker.

## 2023-03-15 NOTE — Progress Notes (Signed)
Chief Complaint  Patient presents with   Knee Pain    Left / doing leg extension exercises pain started about 2 weeks before his visit to [facility] which was on March 6, approximately February 23    I reviewed previous notes and it was noted in the medical record from [facility]:  Apparently the patient had some type of knee injury while working out perhaps.  In any event he was treated with NSAIDs activity modification and is here for follow-up.  He had an x-ray which showed very minimal changes in the knee joint.    I will review his history again and see if there is anything else we can uncover that may be causing or have caused his symptoms  HPI: Encounter date with Ortho care [facility] March 03, 2023 Gary Grimes is a pleasant 62 year old gentleman who is a patient of Dr. Jess Barters.  He comes in today with a 2-week history of left medial and posterior medial knee pain.  He has been working out at [employer] and was working on what he describes as a "leg day "with his trainer.  He did a lot of leg extensions and presses.  Did not have any problems during the exercises but then began having acute onset of medial left knee pain.  He has treated this with Advil.  Does not seem to help him too much.  He sometimes feels like the knee is unstable although he has not had a large amount of fluid or ecchymosis.  No previous history of knee issues   Pain Assessment   Average Pain: 5 Current Pain: 5 Aggravating Factors: flexion of knee Alleviating Factors:  rest  Assessment & Plan: Visit Diagnoses:  1. Acute pain of left knee       Plan: Most of his pain is medial.  Consistent with either a medial meniscus injury or he does have some early degenerative changes of the medial compartment.  Also some grinding with the patellofemoral compartment though is not particularly painful there.  We talked about going forward with a cortisone injection today.  He is diabetic and I did counsel him on  possible rise in his blood sugars over the next couple days.  Also have him stop the Advil and call him in some meloxicam.  Told him to give this a couple weeks and let me know how things go.  He should avoid deep squatting and full leg extensions during this time.  He may walk as tolerated.  If he does not get significant relief would consider an MRI to rule out meniscus pathology  Examination of the left knee  There is tenderness on the posteromedial aspect of the left knee over the medial hamstrings.  There is no defect there but there is weakness on knee flexion  There is no effusion  The ligamentous exam is normal  He is having trouble walking   The imaging again was unremarkable  Encounter Diagnosis  Name Primary?   Hamstring injury, left, initial encounter Yes    Recommend physical therapy Rest, activity modification Heat Compression sleeve Anti-inflammatories for pain Muscle relaxer Cane  Return 4 weeks  Meds ordered this encounter  Medications   ibuprofen (ADVIL) 800 MG tablet    Sig: Take 1 tablet (800 mg total) by mouth every 8 (eight) hours as needed.    Dispense:  90 tablet    Refill:  1   methocarbamol (ROBAXIN) 750 MG tablet    Sig: Take 1  tablet (750 mg total) by mouth 4 (four) times daily.    Dispense:  60 tablet    Refill:  2

## 2023-03-16 NOTE — Telephone Encounter (Signed)
Pt called back to advise he has increased his dose by 10 units and his sugars are still running high. He would like to know what to do to bring his sugars down. Post meal sugars are around 300.
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
## 2023-03-17 ENCOUNTER — Telehealth: Payer: Self-pay | Admitting: Orthopedic Surgery

## 2023-03-17 ENCOUNTER — Ambulatory Visit: Payer: BC Managed Care – PPO | Attending: Orthopedic Surgery | Admitting: Physical Therapy

## 2023-03-17 DIAGNOSIS — M6281 Muscle weakness (generalized): Secondary | ICD-10-CM | POA: Insufficient documentation

## 2023-03-17 DIAGNOSIS — S76302A Unspecified injury of muscle, fascia and tendon of the posterior muscle group at thigh level, left thigh, initial encounter: Secondary | ICD-10-CM | POA: Insufficient documentation

## 2023-03-17 DIAGNOSIS — R278 Other lack of coordination: Secondary | ICD-10-CM | POA: Insufficient documentation

## 2023-03-17 DIAGNOSIS — R262 Difficulty in walking, not elsewhere classified: Secondary | ICD-10-CM | POA: Insufficient documentation

## 2023-03-17 DIAGNOSIS — M25562 Pain in left knee: Secondary | ICD-10-CM | POA: Diagnosis not present

## 2023-03-17 DIAGNOSIS — R293 Abnormal posture: Secondary | ICD-10-CM | POA: Insufficient documentation

## 2023-03-17 NOTE — Telephone Encounter (Signed)
Dr. Ruthe Mannan patient - pt lvm at 1:15pm stating that he was recently seen and that he is in severe pain and can't walk.  Stated that on a scale of 1-10 his pain is at a 50.  He would like a call back, pt's # [phone]

## 2023-03-17 NOTE — Telephone Encounter (Signed)
I called the patient to let him know that we did get his message and that it has been routed.  He said that this pain is debilitating , scared he's going fall and is having to pick up his leg to get in the car.  Stated the meds given is not touching it.

## 2023-03-18 ENCOUNTER — Encounter: Payer: Self-pay | Admitting: Orthopedic Surgery

## 2023-03-18 ENCOUNTER — Ambulatory Visit: Payer: BC Managed Care – PPO | Admitting: Orthopedic Surgery

## 2023-03-18 ENCOUNTER — Telehealth: Payer: Self-pay | Admitting: Family Medicine

## 2023-03-18 ENCOUNTER — Telehealth: Payer: Self-pay | Admitting: Orthopedic Surgery

## 2023-03-18 ENCOUNTER — Ambulatory Visit: Payer: BC Managed Care – PPO | Admitting: Family Medicine

## 2023-03-18 ENCOUNTER — Other Ambulatory Visit: Payer: Self-pay | Admitting: Orthopedic Surgery

## 2023-03-18 ENCOUNTER — Encounter: Payer: BC Managed Care – PPO | Admitting: Orthopedic Surgery

## 2023-03-18 DIAGNOSIS — M25562 Pain in left knee: Secondary | ICD-10-CM | POA: Diagnosis not present

## 2023-03-18 DIAGNOSIS — S76302A Unspecified injury of muscle, fascia and tendon of the posterior muscle group at thigh level, left thigh, initial encounter: Secondary | ICD-10-CM

## 2023-03-18 DIAGNOSIS — M23322 Other meniscus derangements, posterior horn of medial meniscus, left knee: Secondary | ICD-10-CM

## 2023-03-18 MED ORDER — INDOMETHACIN 25 MG PO CAPS: 25.0000 mg | ORAL_CAPSULE | Freq: Three times a day (TID) | ORAL | 0 refills | Status: AC

## 2023-03-18 NOTE — Progress Notes (Signed)
Chief Complaint  Patient presents with   Leg Pain    Left hamstring /workin increased pain    New injury   History Gary Grimes was seen March 03, 2023 with a acute injury to his left knee there was questionable meniscal injury.  I just saw him on 18 March and diagnosed him with a lower hamstring injury.  He said yesterday he was walking across the parking lot from the gym felt another pop in his knee and now he cannot weight-bear cannot fully bend or straighten his knee  He says the ibuprofen and Robaxin are not helping the pain  He said he had some hydrocodone leftover from his back surgery and that seemed to help.  Physical Exam Musculoskeletal:     Comments: Left knee examination he continues to have posteromedial pain in his left knee this is the area of maximal tenderness.  Most of his pain is posteriorly.  His quadriceps patella tendon function are intact no tenderness there joint lines are normal as well.  Extension only out to 20 degrees flexion only up to 60 degrees.  Stability anterior posterior normal collateral ligament stable nontender  Could not do a McMurray's sign patient too much pain  Neurological:     Gait: Gait abnormal.     Comments: Patient is struggling with weightbearing and walking having a lot of difficulty moving about    Encounter Diagnoses  Name Primary?   Acute pain of left knee Yes   Hamstring injury, left, initial encounter    Derangement of posterior horn of medial meniscus of left knee    Assessment and plan  62 year old male 2 injuries to his left knee 1 4 5  weeks ago 1 within the last 48 hours with worsening pain loss of motion inability to weight-bear who has had an adequate course of nonoperative care including muscle relaxers,'s nonsteroidals rest and activity modification  Recommend MRI to rule out internal derangement of the knee with a differential diagnosis as stated above  Encounter Diagnoses  Name Primary?   Acute pain of left knee  Yes   Hamstring injury, left, initial encounter    Derangement of posterior horn of medial meniscus of left knee     Change to indcin stop ibuprofen, prescription already written  Continue robaxin   No orders of the defined types were placed in this encounter.

## 2023-03-18 NOTE — Telephone Encounter (Signed)
Pt went to ortho per msg below. Did not mark as no show, no fee.

## 2023-03-18 NOTE — Telephone Encounter (Signed)
I called him to advise  left message

## 2023-03-18 NOTE — Progress Notes (Signed)
Encounter Diagnosis  Name Primary?   Hamstring injury, left, initial encounter Yes   Meds ordered this encounter  Medications   indomethacin (INDOCIN) 25 MG capsule    Sig: Take 1 capsule (25 mg total) by mouth 3 (three) times daily with meals for 10 days.    Dispense:  30 capsule    Refill:  0

## 2023-03-18 NOTE — Telephone Encounter (Signed)
Dr. Ruthe Mannan patient - Gary Grimes, this patient lvm stating he just missed your call.  He would like for you to call him back.  [phone]

## 2023-03-18 NOTE — Patient Instructions (Signed)
While we are working on your approval for MRI please go ahead and call to schedule your appointment with  Imaging within at least one (1) week.   Central Scheduling [phone]  

## 2023-03-18 NOTE — Telephone Encounter (Signed)
He called in at 9:58 saying he had pulled a muscle in his leg.

## 2023-03-18 NOTE — Telephone Encounter (Signed)
Pt contacted and advised to add 15 more units before each of the meals, maybe also do corrections of 10-15 units every 3-4 hours if sugars remain elevated. Let us know again before the weekend.

## 2023-03-18 NOTE — Telephone Encounter (Signed)
Ok I ll switch him to indocin

## 2023-03-18 NOTE — Telephone Encounter (Signed)
3.21.24 no show letter sent

## 2023-03-19 ENCOUNTER — Ambulatory Visit: Payer: BC Managed Care – PPO | Admitting: Physical Therapy

## 2023-03-19 ENCOUNTER — Encounter: Payer: Self-pay | Admitting: Physical Therapy

## 2023-03-19 DIAGNOSIS — S76302A Unspecified injury of muscle, fascia and tendon of the posterior muscle group at thigh level, left thigh, initial encounter: Secondary | ICD-10-CM | POA: Diagnosis not present

## 2023-03-19 DIAGNOSIS — R262 Difficulty in walking, not elsewhere classified: Secondary | ICD-10-CM

## 2023-03-19 DIAGNOSIS — M25562 Pain in left knee: Secondary | ICD-10-CM

## 2023-03-19 DIAGNOSIS — R293 Abnormal posture: Secondary | ICD-10-CM

## 2023-03-19 DIAGNOSIS — R278 Other lack of coordination: Secondary | ICD-10-CM

## 2023-03-19 DIAGNOSIS — M6281 Muscle weakness (generalized): Secondary | ICD-10-CM
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
## 2023-03-20 ENCOUNTER — Ambulatory Visit (HOSPITAL_COMMUNITY)
Admission: RE | Admit: 2023-03-20 | Discharge: 2023-03-20 | Disposition: A | Discharge status: Home / Self Care | Payer: BC Managed Care – PPO | Source: Ambulatory Visit | Attending: Orthopedic Surgery | Admitting: Orthopedic Surgery

## 2023-03-20 DIAGNOSIS — M25562 Pain in left knee: Secondary | ICD-10-CM | POA: Diagnosis not present

## 2023-03-20 DIAGNOSIS — M25462 Effusion, left knee: Secondary | ICD-10-CM | POA: Diagnosis not present

## 2023-03-22 ENCOUNTER — Ambulatory Visit: Payer: BC Managed Care – PPO | Admitting: Orthopedic Surgery

## 2023-03-23 ENCOUNTER — Telehealth: Payer: Self-pay | Admitting: Orthopedic Surgery

## 2023-03-23 DIAGNOSIS — Z713 Dietary counseling and surveillance: Secondary | ICD-10-CM | POA: Diagnosis not present

## 2023-03-23 DIAGNOSIS — I10 Essential (primary) hypertension: Secondary | ICD-10-CM | POA: Diagnosis not present

## 2023-03-23 DIAGNOSIS — E785 Hyperlipidemia, unspecified: Secondary | ICD-10-CM | POA: Diagnosis not present

## 2023-03-23 DIAGNOSIS — R5382 Chronic fatigue, unspecified: Secondary | ICD-10-CM | POA: Diagnosis not present

## 2023-03-23 DIAGNOSIS — Z724 Inappropriate diet and eating habits: Secondary | ICD-10-CM | POA: Diagnosis not present

## 2023-03-23 DIAGNOSIS — E669 Obesity, unspecified: Secondary | ICD-10-CM | POA: Diagnosis not present

## 2023-03-23 DIAGNOSIS — Z723 Lack of physical exercise: Secondary | ICD-10-CM | POA: Diagnosis not present

## 2023-03-23 NOTE — Telephone Encounter (Signed)
yes

## 2023-03-23 NOTE — Telephone Encounter (Signed)
I spoke to him to advise him to continue therapy, he said he is still waiting on appointment to follow up and Inez Catalina is going to call him, told him I will send message to remind her he still needs appointment to review the MRI with Dr Aline Brochure, unfortunately the appointment was scheduled when we did not have MRI results yet.

## 2023-03-23 NOTE — Telephone Encounter (Signed)
Dr. Ruthe Mannan patient - pt wants to know w/a diagnosis of a torn meniscus, he needs to know if he still needs to continue to go to physical therapy.  He has an appointment for PT and needs to know.  Pt's # [phone]

## 2023-03-23 NOTE — Telephone Encounter (Signed)
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  Amy and Dr. Aline Brochure, this is just fyi/documentation of the call.

## 2023-03-24 ENCOUNTER — Ambulatory Visit: Payer: BC Managed Care – PPO | Admitting: Physical Therapy

## 2023-03-24 ENCOUNTER — Encounter: Payer: Self-pay | Admitting: Physical Therapy

## 2023-03-24 DIAGNOSIS — R293 Abnormal posture: Secondary | ICD-10-CM | POA: Diagnosis not present

## 2023-03-24 DIAGNOSIS — M6281 Muscle weakness (generalized): Secondary | ICD-10-CM

## 2023-03-24 DIAGNOSIS — R278 Other lack of coordination: Secondary | ICD-10-CM | POA: Diagnosis not present

## 2023-03-24 DIAGNOSIS — S76302A Unspecified injury of muscle, fascia and tendon of the posterior muscle group at thigh level, left thigh, initial encounter: Secondary | ICD-10-CM | POA: Diagnosis not present

## 2023-03-24 DIAGNOSIS — M25562 Pain in left knee: Secondary | ICD-10-CM | POA: Diagnosis not present

## 2023-03-24 DIAGNOSIS — R262 Difficulty in walking, not elsewhere classified: Secondary | ICD-10-CM
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
## 2023-03-25 ENCOUNTER — Ambulatory Visit (HOSPITAL_COMMUNITY): Payer: BC Managed Care – PPO

## 2023-03-29 ENCOUNTER — Encounter: Payer: Self-pay | Admitting: Orthopedic Surgery

## 2023-03-29 ENCOUNTER — Ambulatory Visit: Payer: BC Managed Care – PPO | Admitting: Orthopedic Surgery

## 2023-03-29 DIAGNOSIS — Z6841 Body Mass Index (BMI) 40.0 and over, adult: Secondary | ICD-10-CM | POA: Diagnosis not present

## 2023-03-29 DIAGNOSIS — M1712 Unilateral primary osteoarthritis, left knee: Secondary | ICD-10-CM | POA: Diagnosis not present

## 2023-03-29 DIAGNOSIS — M23322 Other meniscus derangements, posterior horn of medial meniscus, left knee: Secondary | ICD-10-CM

## 2023-03-29 NOTE — Progress Notes (Signed)
Follow-up for MRI results  Chief Complaint  Patient presents with   Results    Review MRI left knee     History this is a 63 year old male who had an injury to his left knee with severe posteromedial pain.  I initially thought this was a hamstring injury but because of the persistent and worsening pain he went for MRI which shows he has a meniscal tear at the root.  Reexamination shows that this is indeed the area of his tenderness  His symptoms have improved he is walking with a cane but says any type of twisting or pivoting on the knee the pain becomes severe  We went over the MRI findings  In my opinion he has osteoarthritis of the left knee moderate with a meniscal root tear.  We discussed this with Gary Grimes and indicated that removing the meniscal root can lead to worsening function of the knee because of the functions that the meniscus provides.  And although his initial course might improve the knee off and rapidly deteriorates and it is necessary to proceed with further surgery including knee replacement  At this time we have improved and we decided to let him continue to use his cane use a home exercise program for quadricep strengthening and reevaluate in 4 weeks to see if surgery is needed he can also see [name] sooner if his symptoms warrant

## 2023-03-31 ENCOUNTER — Ambulatory Visit: Payer: BC Managed Care – PPO | Admitting: Physical Therapy

## 2023-04-01 ENCOUNTER — Ambulatory Visit: Payer: BC Managed Care – PPO | Admitting: Orthopedic Surgery

## 2023-04-01 ENCOUNTER — Ambulatory Visit: Payer: BC Managed Care – PPO | Admitting: Physical Therapy

## 2023-04-01 DIAGNOSIS — F509 Eating disorder, unspecified: Secondary | ICD-10-CM | POA: Diagnosis not present

## 2023-04-02 ENCOUNTER — Ambulatory Visit: Payer: BC Managed Care – PPO | Admitting: Nurse Practitioner

## 2023-04-06 DIAGNOSIS — E669 Obesity, unspecified: Secondary | ICD-10-CM | POA: Diagnosis not present

## 2023-04-06 DIAGNOSIS — R5382 Chronic fatigue, unspecified: Secondary | ICD-10-CM | POA: Diagnosis not present

## 2023-04-06 DIAGNOSIS — E785 Hyperlipidemia, unspecified: Secondary | ICD-10-CM | POA: Diagnosis not present

## 2023-04-06 DIAGNOSIS — I10 Essential (primary) hypertension: Secondary | ICD-10-CM | POA: Diagnosis not present

## 2023-04-07 ENCOUNTER — Ambulatory Visit: Payer: BC Managed Care – PPO | Admitting: Physical Therapy

## 2023-04-08 ENCOUNTER — Encounter: Payer: Self-pay | Admitting: Internal Medicine

## 2023-04-08 ENCOUNTER — Ambulatory Visit: Payer: BC Managed Care – PPO | Admitting: Internal Medicine

## 2023-04-08 ENCOUNTER — Ambulatory Visit: Payer: BC Managed Care – PPO | Admitting: Physical Therapy

## 2023-04-08 VITALS — BP 128/72 | HR 82 | Ht 71.0 in | Wt 319.0 lb

## 2023-04-08 DIAGNOSIS — E1159 Type 2 diabetes mellitus with other circulatory complications: Secondary | ICD-10-CM

## 2023-04-08 DIAGNOSIS — E782 Mixed hyperlipidemia: Secondary | ICD-10-CM | POA: Diagnosis not present

## 2023-04-08 DIAGNOSIS — E1165 Type 2 diabetes mellitus with hyperglycemia: Secondary | ICD-10-CM | POA: Diagnosis not present

## 2023-04-08 DIAGNOSIS — Z6841 Body Mass Index (BMI) 40.0 and over, adult: Secondary | ICD-10-CM

## 2023-04-08 LAB — POCT GLYCOSYLATED HEMOGLOBIN (HGB A1C): Hemoglobin A1C: 7.6 % — AB (ref 4.0–5.6)

## 2023-04-08 MED ORDER — HUMULIN R U-500 KWIKPEN 500 UNIT/ML ~~LOC~~ SOPN: PEN_INJECTOR | SUBCUTANEOUS | 3 refills | Status: DC
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
## 2023-04-08 NOTE — Patient Instructions (Addendum)
Please use U500: - 90 units before b'fast - please try to take it 45-60 min before b'fast - 50 units before dinner - please try to take it 45-60 min before dinner  Please return in 3 months with your sugar log/CGM receiver.

## 2023-04-14 ENCOUNTER — Ambulatory Visit: Payer: BC Managed Care – PPO | Admitting: Physical Therapy

## 2023-04-15 ENCOUNTER — Ambulatory Visit: Payer: BC Managed Care – PPO | Admitting: Physical Therapy

## 2023-04-15 ENCOUNTER — Ambulatory Visit: Payer: BC Managed Care – PPO | Admitting: Orthopedic Surgery

## 2023-04-19 ENCOUNTER — Telehealth: Payer: Self-pay | Admitting: Orthopedic Surgery

## 2023-04-19 DIAGNOSIS — M23322 Other meniscus derangements, posterior horn of medial meniscus, left knee: Secondary | ICD-10-CM

## 2023-04-19 NOTE — Telephone Encounter (Signed)
Dr. Mort Sawyers pt - pt lvm stating he met up with Dr. Romeo Apple this weekend and they have decided to move forward with surgery.  He stated the message was for Amy, Dr. Mort Sawyers surgery scheduler.  He would like a call back today to get that set up.

## 2023-04-20 ENCOUNTER — Other Ambulatory Visit: Payer: Self-pay | Admitting: Orthopedic Surgery

## 2023-04-20 DIAGNOSIS — Z01818 Encounter for other preprocedural examination: Secondary | ICD-10-CM

## 2023-04-20 DIAGNOSIS — E669 Obesity, unspecified: Secondary | ICD-10-CM | POA: Diagnosis not present

## 2023-04-20 DIAGNOSIS — R5382 Chronic fatigue, unspecified: Secondary | ICD-10-CM | POA: Diagnosis not present

## 2023-04-20 DIAGNOSIS — E785 Hyperlipidemia, unspecified: Secondary | ICD-10-CM | POA: Diagnosis not present

## 2023-04-20 DIAGNOSIS — M23322 Other meniscus derangements, posterior horn of medial meniscus, left knee: Secondary | ICD-10-CM

## 2023-04-20 DIAGNOSIS — I10 Essential (primary) hypertension: Secondary | ICD-10-CM | POA: Diagnosis not present

## 2023-04-20 MED ORDER — CEFAZOLIN IN SODIUM CHLORIDE 3-0.9 GM/100ML-% IV SOLN
3.0000 g | INTRAVENOUS | Status: AC
Start: 2023-04-21 — End: 2023-04-22

## 2023-04-20 NOTE — Addendum Note (Signed)
Addended byCaffie Damme on: 04/20/2023 04:26 PM   Modules accepted: Orders

## 2023-04-20 NOTE — Telephone Encounter (Signed)
Is this for SALK medial meniscectomy?  His A1c is 7.6 . Does he need any clearances?

## 2023-04-20 NOTE — Telephone Encounter (Signed)
Put on for May 1st  Advised him to d/c phentermine 1 week / d/c tomorrow  Told him Eber Jones will call from [facility] Sent message to Gerard Cantara B for the precert

## 2023-04-21 ENCOUNTER — Other Ambulatory Visit: Payer: Self-pay | Admitting: Pulmonary Disease

## 2023-04-21 ENCOUNTER — Telehealth: Payer: Self-pay | Admitting: Pulmonary Disease

## 2023-04-21 ENCOUNTER — Ambulatory Visit: Payer: BC Managed Care – PPO | Admitting: Physical Therapy

## 2023-04-21 MED ORDER — CLONAZEPAM 2 MG PO TABS: 2.0000 mg | ORAL_TABLET | Freq: Every day | ORAL | 1 refills | Status: DC

## 2023-04-21 NOTE — Telephone Encounter (Signed)
Pt is wanting a script of Clonazepam. States Belsomra is not working well and has been unable to get a full nights rest in almost a week.   Dr. Val [facility] please advise?

## 2023-04-21 NOTE — Telephone Encounter (Signed)
Pt called the office back stating that he needs the Rx to be sent in ASAP. Made pt aware that AO was in clinic seeing pts. Routing back to Dr. Val [facility].

## 2023-04-21 NOTE — Telephone Encounter (Signed)
Pt. Calling to get a new scrpit to pharmacy Clonazepame due to the Suvorexant (BELSOMRA) 20 MG TABS  is not working for him and has not gotten a good nights rest in 5days

## 2023-04-21 NOTE — Telephone Encounter (Signed)
Patient calling again stating he may have to go to the hospital if he doesn't sleep tonight. Would like [facility] [address].

## 2023-04-22 ENCOUNTER — Ambulatory Visit: Payer: BC Managed Care – PPO | Admitting: Physical Therapy

## 2023-04-23 NOTE — Patient Instructions (Signed)
Gary Grimes  04/23/2023     @[email or web address] @   Your procedure is scheduled on  04/28/2023.   Report to [name] at  0700  A.M.   Call this number if you have problems the morning of surgery:  [phone]  If you experience any cold or flu symptoms such as cough, fever, chills, shortness of breath, etc. between now and your scheduled surgery, please notify us at the above number.   Remember:  Do not eat or drink after midnight.    Your last dose of phenetermine should be 4/23 or before.       DO NOT take any medications for diabetes the morning of your procedure.    Take these medicines the morning of surgery with A SIP OF WATER       clonazepam, mobic(if needed), flexeril or zanaflex (if needed), lyrica.     Do not wear jewelry, make-up or nail polish.  Do not wear lotions, powders, or perfumes, or deodorant.  Do not shave 48 hours prior to surgery.  Men may shave face and neck.  Do not bring valuables to the hospital.  [facility] is not responsible for any belongings or valuables.  Contacts, dentures or bridgework may not be worn into surgery.  Leave your suitcase in the car.  After surgery it may be brought to your room.  For patients admitted to the hospital, discharge time will be determined by your treatment team.  Patients discharged the day of surgery will not be allowed to drive home and must have someone with them for 24 hours.    Special instructions:   DO NOT smoke tobacco or vape for 24 hours before your procedure.  Please read over the following fact sheets that you were given. Pain Booklet, Coughing and Deep Breathing, Surgical Site Infection Prevention, Anesthesia Post-op Instructions, and Care and Recovery After Surgery      Arthroscopic Knee Ligament Repair, Care After This sheet gives you information about how to care for yourself after your procedure. Your health care provider may also give you more specific instructions.  If you have problems or questions, contact your health care provider. What can I expect after the procedure? After the procedure, it is common to have: Soreness or pain in your knee. Bruising and swelling on your knee, calf, and ankle for 3-4 days. A small amount of fluid coming from the incisions. Follow these instructions at home: Medicines Take over-the-counter and prescription medicines only as told by your health care provider. Ask your health care provider if the medicine prescribed to you: Requires you to avoid driving or using machinery. Can cause constipation. You may need to take these actions to prevent or treat constipation: Drink enough fluid to keep your urine pale yellow. Take over-the-counter or prescription medicines. Eat foods that are high in fiber, such as beans, whole grains, and fresh fruits and vegetables. Limit foods that are high in fat and processed sugars, such as fried or sweet foods. If you have a brace or immobilizer: Wear it as told by your health care provider. Remove it only as told by your health care provider. Loosen it if your toes tingle, become numb, or turn cold and blue. Keep it clean and dry. Ask your health care provider when it is safe to drive. Bathing Do not take baths, swim, or use a hot tub until your health care provider approves. Keep your bandage (dressing) dry until your health  care provider says that it can be removed. If the brace or immobilizer is not waterproof: Do not let it get wet. Cover it with a watertight covering when you take a bath or shower. Incision care  Follow instructions from your health care provider about how to take care of your incisions. Make sure you: Wash your hands with soap and water for at least 20 seconds before and after you change your dressing. If soap and water are not available, use hand sanitizer. Change your dressing as told by your health care provider. Leave stitches (sutures), skin glue, or  adhesive strips in place. These skin closures may need to stay in place for 2 weeks or longer. If adhesive strip edges start to loosen and curl up, you may trim the loose edges. Do not remove adhesive strips completely unless your health care provider tells you to do that. Check your incision areas every day for signs of infection. Check for: Redness. More swelling or pain. Blood or more fluid. Warmth. Pus or a bad smell. Managing pain, stiffness, and swelling  If directed, put ice on the affected area. To do this: If you have a removable brace or immobilizer, remove it as told by your health care provider. Put ice in a plastic bag. Place a towel between your skin and the bag. Leave the ice on for 20 minutes, 2-3 times a day. Remove the ice if your skin turns bright red. This is very important. If you cannot feel pain, heat, or cold, you have a greater risk of damage to the area. Move your toes often to reduce stiffness and swelling. Raise (elevate) the injured area above the level of your heart while you are sitting or lying down. Activity Do not use your knee to support your body weight until your health care provider says that you can. Use crutches or other devices as told by your health care provider. Do physical therapy exercises as told by your health care provider. Physical therapy will help you regain movement and strength in your knee. Follow instructions from your health care provider about: When you may start motion exercises. When you may start riding a stationary bike and doing other low-impact activities. When you may start to jog and do other high-impact activities. Do not lift anything that is heavier than 10 lb (4.5 kg), or the limit that you are told, until your health care provider says that it is safe. Ask your health care provider what activities are safe for you. General instructions Do not use any products that contain nicotine or tobacco, such as cigarettes,  e-cigarettes, and chewing tobacco. These can delay healing. If you need help quitting, ask your health care provider. Wear compression stockings as told by your health care provider. These stockings help to prevent blood clots and reduce swelling in your legs. Keep all follow-up visits. This is important. Contact a health care provider if: You have any of these signs of infection: Redness around an incision. Blood or more fluid coming from an incision. Warmth coming from an incision. Pus or a bad smell coming from an incision. More swelling or pain in your knee. A fever or chills. You have pain that does not get better with medicine. Your incision opens up. Get help right away if: You have trouble breathing. You have chest pain. You have increased pain or swelling in your calf or at the back of your knee. You have numbness and tingling near the knee joint or in the  foot, ankle, or toes. You notice that your foot or toes look darker than normal or are cooler than normal. These symptoms may represent a serious problem that is an emergency. Do not wait to see if the symptoms will go away. Get medical help right away. Call your local emergency services (911 in the [place] Do not drive yourself to the hospital. Summary After the procedure, it is common to have knee pain with bruising and swelling on your knee, calf, and ankle. Icing your knee and raising your leg above the level of your heart will help control the pain and swelling. Do physical therapy exercises as told by your health care provider. Physical therapy will help you regain movement and strength in your knee. This information is not intended to replace advice given to you by your health care provider. Make sure you discuss any questions you have with your health care provider. Document Revised: 05/13/2020 Document Reviewed: 05/13/2020 Elsevier Patient Education  2023 [employer] General Anesthesia, Adult, Care After The  following information offers guidance on how to care for yourself after your procedure. Your health care provider may also give you more specific instructions. If you have problems or questions, contact your health care provider. What can I expect after the procedure? After the procedure, it is common for people to: Have pain or discomfort at the IV site. Have nausea or vomiting. Have a sore throat or hoarseness. Have trouble concentrating. Feel cold or chills. Feel weak, sleepy, or tired (fatigue). Have soreness and body aches. These can affect parts of the body that were not involved in surgery. Follow these instructions at home: For the time period you were told by your health care provider:  Rest. Do not participate in activities where you could fall or become injured. Do not drive or use machinery. Do not drink alcohol. Do not take sleeping pills or medicines that cause drowsiness. Do not make important decisions or sign legal documents. Do not take care of children on your own. General instructions Drink enough fluid to keep your urine pale yellow. If you have sleep apnea, surgery and certain medicines can increase your risk for breathing problems. Follow instructions from your health care provider about wearing your sleep device: Anytime you are sleeping, including during daytime naps. While taking prescription pain medicines, sleeping medicines, or medicines that make you drowsy. Return to your normal activities as told by your health care provider. Ask your health care provider what activities are safe for you. Take over-the-counter and prescription medicines only as told by your health care provider. Do not use any products that contain nicotine or tobacco. These products include cigarettes, chewing tobacco, and vaping devices, such as e-cigarettes. These can delay incision healing after surgery. If you need help quitting, ask your health care provider. Contact a health care  provider if: You have nausea or vomiting that does not get better with medicine. You vomit every time you eat or drink. You have pain that does not get better with medicine. You cannot urinate or have bloody urine. You develop a skin rash. You have a fever. Get help right away if: You have trouble breathing. You have chest pain. You vomit blood. These symptoms may be an emergency. Get help right away. Call 911. Do not wait to see if the symptoms will go away. Do not drive yourself to the hospital. Summary After the procedure, it is common to have a sore throat, hoarseness, nausea, vomiting, or to feel weak, sleepy, or  fatigue. For the time period you were told by your health care provider, do not drive or use machinery. Get help right away if you have difficulty breathing, have chest pain, or vomit blood. These symptoms may be an emergency. This information is not intended to replace advice given to you by your health care provider. Make sure you discuss any questions you have with your health care provider. Document Revised: 03/13/2022 Document Reviewed: 03/13/2022 Elsevier Patient Education  2023 [employer] How to Use Chlorhexidine Before Surgery Chlorhexidine gluconate (CHG) is a germ-killing (antiseptic) solution that is used to clean the skin. It can get rid of the bacteria that normally live on the skin and can keep them away for about 24 hours. To clean your skin with CHG, you may be given: A CHG solution to use in the shower or as part of a sponge bath. A prepackaged cloth that contains CHG. Cleaning your skin with CHG may help lower the risk for infection: While you are staying in the intensive care unit of the hospital. If you have a vascular access, such as a central line, to provide short-term or long-term access to your veins. If you have a catheter to drain urine from your bladder. If you are on a ventilator. A ventilator is a machine that helps you breathe by moving  air in and out of your lungs. After surgery. What are the risks? Risks of using CHG include: A skin reaction. Hearing loss, if CHG gets in your ears and you have a perforated eardrum. Eye injury, if CHG gets in your eyes and is not rinsed out. The CHG product catching fire. Make sure that you avoid smoking and flames after applying CHG to your skin. Do not use CHG: If you have a chlorhexidine allergy or have previously reacted to chlorhexidine. On babies younger than 71 months of age. How to use CHG solution Use CHG only as told by your health care provider, and follow the instructions on the label. Use the full amount of CHG as directed. Usually, this is one bottle. During a shower Follow these steps when using CHG solution during a shower (unless your health care provider gives you different instructions): Start the shower. Use your normal soap and shampoo to wash your face and hair. Turn off the shower or move out of the shower stream. Pour the CHG onto a clean washcloth. Do not use any type of brush or rough-edged sponge. Starting at your neck, lather your body down to your toes. Make sure you follow these instructions: If you will be having surgery, pay special attention to the part of your body where you will be having surgery. Scrub this area for at least 1 minute. Do not use CHG on your head or face. If the solution gets into your ears or eyes, rinse them well with water. Avoid your genital area. Avoid any areas of skin that have broken skin, cuts, or scrapes. Scrub your back and under your arms. Make sure to wash skin folds. Let the lather sit on your skin for 1-2 minutes or as long as told by your health care provider. Thoroughly rinse your entire body in the shower. Make sure that all body creases and crevices are rinsed well. Dry off with a clean towel. Do not put any substances on your body afterward--such as powder, lotion, or perfume--unless you are told to do so by your  health care provider. Only use lotions that are recommended by the manufacturer. Put on  clean clothes or pajamas. If it is the night before your surgery, sleep in clean sheets.  During a sponge bath Follow these steps when using CHG solution during a sponge bath (unless your health care provider gives you different instructions): Use your normal soap and shampoo to wash your face and hair. Pour the CHG onto a clean washcloth. Starting at your neck, lather your body down to your toes. Make sure you follow these instructions: If you will be having surgery, pay special attention to the part of your body where you will be having surgery. Scrub this area for at least 1 minute. Do not use CHG on your head or face. If the solution gets into your ears or eyes, rinse them well with water. Avoid your genital area. Avoid any areas of skin that have broken skin, cuts, or scrapes. Scrub your back and under your arms. Make sure to wash skin folds. Let the lather sit on your skin for 1-2 minutes or as long as told by your health care provider. Using a different clean, wet washcloth, thoroughly rinse your entire body. Make sure that all body creases and crevices are rinsed well. Dry off with a clean towel. Do not put any substances on your body afterward--such as powder, lotion, or perfume--unless you are told to do so by your health care provider. Only use lotions that are recommended by the manufacturer. Put on clean clothes or pajamas. If it is the night before your surgery, sleep in clean sheets. How to use CHG prepackaged cloths Only use CHG cloths as told by your health care provider, and follow the instructions on the label. Use the CHG cloth on clean, dry skin. Do not use the CHG cloth on your head or face unless your health care provider tells you to. When washing with the CHG cloth: Avoid your genital area. Avoid any areas of skin that have broken skin, cuts, or scrapes. Before surgery Follow  these steps when using a CHG cloth to clean before surgery (unless your health care provider gives you different instructions): Using the CHG cloth, vigorously scrub the part of your body where you will be having surgery. Scrub using a back-and-forth motion for 3 minutes. The area on your body should be completely wet with CHG when you are done scrubbing. Do not rinse. Discard the cloth and let the area air-dry. Do not put any substances on the area afterward, such as powder, lotion, or perfume. Put on clean clothes or pajamas. If it is the night before your surgery, sleep in clean sheets.  For general bathing Follow these steps when using CHG cloths for general bathing (unless your health care provider gives you different instructions). Use a separate CHG cloth for each area of your body. Make sure you wash between any folds of skin and between your fingers and toes. Wash your body in the following order, switching to a new cloth after each step: The front of your neck, shoulders, and chest. Both of your arms, under your arms, and your hands. Your stomach and groin area, avoiding the genitals. Your right leg and foot. Your left leg and foot. The back of your neck, your back, and your buttocks. Do not rinse. Discard the cloth and let the area air-dry. Do not put any substances on your body afterward--such as powder, lotion, or perfume--unless you are told to do so by your health care provider. Only use lotions that are recommended by the manufacturer. Put on clean clothes or  pajamas. Contact a health care provider if: Your skin gets irritated after scrubbing. You have questions about using your solution or cloth. You swallow any chlorhexidine. Call your local poison control center (([phone] 076-2916 in the [place] Get help right away if: Your eyes itch badly, or they become very red or swollen. Your skin itches badly and is red or swollen. Your hearing changes. You have trouble seeing. You have  swelling or tingling in your mouth or throat. You have trouble breathing. These symptoms may represent a serious problem that is an emergency. Do not wait to see if the symptoms will go away. Get medical help right away. Call your local emergency services (911 in the [place] Do not drive yourself to the hospital. Summary Chlorhexidine gluconate (CHG) is a germ-killing (antiseptic) solution that is used to clean the skin. Cleaning your skin with CHG may help to lower your risk for infection. You may be given CHG to use for bathing. It may be in a bottle or in a prepackaged cloth to use on your skin. Carefully follow your health care provider's instructions and the instructions on the product label. Do not use CHG if you have a chlorhexidine allergy. Contact your health care provider if your skin gets irritated after scrubbing. This information is not intended to replace advice given to you by your health care provider. Make sure you discuss any questions you have with your health care provider. Document Revised: 04/13/2022 Document Reviewed: 02/24/2021 Elsevier Patient Education  2023 [employer].
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
## 2023-04-26 ENCOUNTER — Encounter (HOSPITAL_COMMUNITY)
Admission: RE | Admit: 2023-04-26 | Discharge: 2023-04-26 | Disposition: A | Discharge status: Home / Self Care | Payer: BC Managed Care – PPO | Source: Ambulatory Visit | Attending: Orthopedic Surgery | Admitting: Orthopedic Surgery

## 2023-04-26 ENCOUNTER — Encounter (HOSPITAL_COMMUNITY): Payer: Self-pay

## 2023-04-26 ENCOUNTER — Other Ambulatory Visit (HOSPITAL_COMMUNITY): Payer: BC Managed Care – PPO

## 2023-04-26 ENCOUNTER — Telehealth: Payer: Self-pay | Admitting: Orthopedic Surgery

## 2023-04-26 VITALS — BP 128/72 | HR 82 | Resp 18 | Ht 71.0 in | Wt 319.0 lb

## 2023-04-26 DIAGNOSIS — M94262 Chondromalacia, left knee: Secondary | ICD-10-CM | POA: Diagnosis not present

## 2023-04-26 DIAGNOSIS — Z01818 Encounter for other preprocedural examination: Secondary | ICD-10-CM

## 2023-04-26 DIAGNOSIS — Z09 Encounter for follow-up examination after completed treatment for conditions other than malignant neoplasm: Secondary | ICD-10-CM | POA: Diagnosis not present

## 2023-04-26 DIAGNOSIS — I10 Essential (primary) hypertension: Secondary | ICD-10-CM | POA: Diagnosis not present

## 2023-04-26 DIAGNOSIS — X58XXXA Exposure to other specified factors, initial encounter: Secondary | ICD-10-CM | POA: Diagnosis not present

## 2023-04-26 DIAGNOSIS — I2510 Atherosclerotic heart disease of native coronary artery without angina pectoris: Secondary | ICD-10-CM | POA: Diagnosis not present

## 2023-04-26 DIAGNOSIS — M23322 Other meniscus derangements, posterior horn of medial meniscus, left knee: Secondary | ICD-10-CM | POA: Insufficient documentation

## 2023-04-26 DIAGNOSIS — S83207A Unspecified tear of unspecified meniscus, current injury, left knee, initial encounter: Secondary | ICD-10-CM | POA: Diagnosis present

## 2023-04-26 DIAGNOSIS — Z01812 Encounter for preprocedural laboratory examination: Secondary | ICD-10-CM | POA: Insufficient documentation

## 2023-04-26 DIAGNOSIS — S83242A Other tear of medial meniscus, current injury, left knee, initial encounter: Secondary | ICD-10-CM | POA: Diagnosis not present

## 2023-04-26 DIAGNOSIS — Z794 Long term (current) use of insulin: Secondary | ICD-10-CM | POA: Diagnosis not present

## 2023-04-26 DIAGNOSIS — K3184 Gastroparesis: Secondary | ICD-10-CM | POA: Diagnosis not present

## 2023-04-26 DIAGNOSIS — Z87891 Personal history of nicotine dependence: Secondary | ICD-10-CM | POA: Diagnosis not present

## 2023-04-26 DIAGNOSIS — E1142 Type 2 diabetes mellitus with diabetic polyneuropathy: Secondary | ICD-10-CM | POA: Diagnosis not present

## 2023-04-26 DIAGNOSIS — G4733 Obstructive sleep apnea (adult) (pediatric): Secondary | ICD-10-CM | POA: Diagnosis not present

## 2023-04-26 DIAGNOSIS — M1712 Unilateral primary osteoarthritis, left knee: Secondary | ICD-10-CM | POA: Diagnosis not present

## 2023-04-26 DIAGNOSIS — Z6841 Body Mass Index (BMI) 40.0 and over, adult: Secondary | ICD-10-CM | POA: Diagnosis not present

## 2023-04-26 DIAGNOSIS — E1143 Type 2 diabetes mellitus with diabetic autonomic (poly)neuropathy: Secondary | ICD-10-CM | POA: Diagnosis not present

## 2023-04-26 DIAGNOSIS — E1140 Type 2 diabetes mellitus with diabetic neuropathy, unspecified: Secondary | ICD-10-CM

## 2023-04-26 DIAGNOSIS — F418 Other specified anxiety disorders: Secondary | ICD-10-CM | POA: Diagnosis not present

## 2023-04-26 DIAGNOSIS — F319 Bipolar disorder, unspecified: Secondary | ICD-10-CM | POA: Diagnosis not present

## 2023-04-26 HISTORY — DX: Cardiac arrhythmia, unspecified: I49.9

## 2023-04-26 LAB — BASIC METABOLIC PANEL
Anion gap: 8 (ref 5–15)
BUN: 19 mg/dL (ref 8–23)
CO2: 25 mmol/L (ref 22–32)
Calcium: 9.1 mg/dL (ref 8.9–10.3)
Chloride: 103 mmol/L (ref 98–111)
Creatinine, Ser: 1.24 mg/dL (ref 0.61–1.24)
GFR, Estimated: >60 mL/min (ref >60)
Glucose, Bld: 227 mg/dL — ABNORMAL HIGH (ref 70–99)
Potassium: 4 mmol/L (ref 3.5–5.1)
Sodium: 136 mmol/L (ref 135–145)

## 2023-04-26 LAB — CBC WITH DIFFERENTIAL/PLATELET
Abs Immature Granulocytes: 0.08 10*3/uL — ABNORMAL HIGH (ref 0.00–0.07)
Basophils Absolute: 0 10*3/uL (ref 0.0–0.1)
Basophils Relative: 1 %
Eosinophils Absolute: 0.2 10*3/uL (ref 0.0–0.5)
Eosinophils Relative: 2 %
HCT: 37.7 % — ABNORMAL LOW (ref 39.0–52.0)
Hemoglobin: 13.1 g/dL (ref 13.0–17.0)
Immature Granulocytes: 1 %
Lymphocytes Relative: 37 %
Lymphs Abs: 3.1 10*3/uL (ref 0.7–4.0)
MCH: 32.2 pg (ref 26.0–34.0)
MCHC: 34.7 g/dL (ref 30.0–36.0)
MCV: 92.6 fL (ref 80.0–100.0)
Monocytes Absolute: 0.8 10*3/uL (ref 0.1–1.0)
Monocytes Relative: 9 %
Neutro Abs: 4.1 10*3/uL (ref 1.7–7.7)
Neutrophils Relative %: 50 %
Platelets: 241 10*3/uL (ref 150–400)
RBC: 4.07 MIL/uL — ABNORMAL LOW (ref 4.22–5.81)
RDW: 13.6 % (ref 11.5–15.5)
WBC: 8.2 10*3/uL (ref 4.0–10.5)
nRBC: 0 % (ref 0.0–0.2)

## 2023-04-26 LAB — HEMOGLOBIN A1C
Hgb A1c MFr Bld: 7 % — ABNORMAL HIGH (ref 4.8–5.6)
Mean Plasma Glucose: 154.2 mg/dL

## 2023-04-26 NOTE — Telephone Encounter (Signed)
Patient walked in asked if ok to drive 1 and half hours to [facility] then back for a meeting on Thursday I told him too soon, he should not do that  To you FYI

## 2023-04-27 ENCOUNTER — Telehealth: Payer: Self-pay | Admitting: Orthopedic Surgery

## 2023-04-27 NOTE — Telephone Encounter (Signed)
[employer] forms received. To [employer].

## 2023-04-27 NOTE — Progress Notes (Signed)
Patient is no longer taking Eliquis after having an Ablation in 2019.  We will proceed with the surgery and patient will  need to follow up with cardiology about when and if he needs to restart his Eliquis.

## 2023-04-28 ENCOUNTER — Ambulatory Visit (HOSPITAL_COMMUNITY): Payer: BC Managed Care – PPO | Admitting: Certified Registered"

## 2023-04-28 ENCOUNTER — Ambulatory Visit (HOSPITAL_COMMUNITY)
Admission: RE | Admit: 2023-04-28 | Discharge: 2023-04-28 | Disposition: A | Discharge status: Home / Self Care | Payer: BC Managed Care – PPO | Attending: Orthopedic Surgery | Admitting: Orthopedic Surgery

## 2023-04-28 ENCOUNTER — Other Ambulatory Visit: Payer: Self-pay

## 2023-04-28 ENCOUNTER — Encounter (HOSPITAL_COMMUNITY): Payer: Self-pay | Admitting: Orthopedic Surgery

## 2023-04-28 ENCOUNTER — Encounter (HOSPITAL_COMMUNITY)
Admission: RE | Disposition: A | Discharge status: Home / Self Care | Payer: Self-pay | Source: Home / Self Care | Attending: Orthopedic Surgery

## 2023-04-28 DIAGNOSIS — M1712 Unilateral primary osteoarthritis, left knee: Secondary | ICD-10-CM | POA: Diagnosis not present

## 2023-04-28 DIAGNOSIS — I2510 Atherosclerotic heart disease of native coronary artery without angina pectoris: Secondary | ICD-10-CM | POA: Diagnosis not present

## 2023-04-28 DIAGNOSIS — I10 Essential (primary) hypertension: Secondary | ICD-10-CM | POA: Insufficient documentation

## 2023-04-28 DIAGNOSIS — F319 Bipolar disorder, unspecified: Secondary | ICD-10-CM | POA: Insufficient documentation

## 2023-04-28 DIAGNOSIS — E1143 Type 2 diabetes mellitus with diabetic autonomic (poly)neuropathy: Secondary | ICD-10-CM | POA: Insufficient documentation

## 2023-04-28 DIAGNOSIS — Z794 Long term (current) use of insulin: Secondary | ICD-10-CM | POA: Diagnosis not present

## 2023-04-28 DIAGNOSIS — E1142 Type 2 diabetes mellitus with diabetic polyneuropathy: Secondary | ICD-10-CM | POA: Diagnosis not present

## 2023-04-28 DIAGNOSIS — S83242A Other tear of medial meniscus, current injury, left knee, initial encounter: Secondary | ICD-10-CM | POA: Diagnosis not present

## 2023-04-28 DIAGNOSIS — K3184 Gastroparesis: Secondary | ICD-10-CM | POA: Insufficient documentation

## 2023-04-28 DIAGNOSIS — I25119 Atherosclerotic heart disease of native coronary artery with unspecified angina pectoris: Secondary | ICD-10-CM | POA: Diagnosis not present

## 2023-04-28 DIAGNOSIS — Z6841 Body Mass Index (BMI) 40.0 and over, adult: Secondary | ICD-10-CM | POA: Diagnosis not present

## 2023-04-28 DIAGNOSIS — M94262 Chondromalacia, left knee: Secondary | ICD-10-CM | POA: Diagnosis not present

## 2023-04-28 DIAGNOSIS — X58XXXA Exposure to other specified factors, initial encounter: Secondary | ICD-10-CM | POA: Insufficient documentation

## 2023-04-28 DIAGNOSIS — G4733 Obstructive sleep apnea (adult) (pediatric): Secondary | ICD-10-CM | POA: Diagnosis not present

## 2023-04-28 DIAGNOSIS — Z09 Encounter for follow-up examination after completed treatment for conditions other than malignant neoplasm: Secondary | ICD-10-CM | POA: Diagnosis not present

## 2023-04-28 DIAGNOSIS — Z87891 Personal history of nicotine dependence: Secondary | ICD-10-CM | POA: Diagnosis not present

## 2023-04-28 DIAGNOSIS — F418 Other specified anxiety disorders: Secondary | ICD-10-CM | POA: Insufficient documentation

## 2023-04-28 HISTORY — PX: KNEE ARTHROSCOPY WITH MEDIAL MENISECTOMY: SHX5651

## 2023-04-28 HISTORY — DX: Other tear of medial meniscus, current injury, left knee, initial encounter: S83.242A

## 2023-04-28 HISTORY — PX: KNEE ARTHROSCOPY WITH MENISCAL REPAIR: SHX5653

## 2023-04-28 HISTORY — PX: CHONDROPLASTY: SHX5177

## 2023-04-28 LAB — GLUCOSE, CAPILLARY
Glucose-Capillary: 181 mg/dL — ABNORMAL HIGH (ref 70–99)
Glucose-Capillary: 168 mg/dL — ABNORMAL HIGH (ref 70–99)

## 2023-04-28 SURGERY — ARTHROSCOPY, KNEE, WITH LATERAL MENISCECTOMY
Anesthesia: Choice | Site: Knee | Laterality: Left

## 2023-04-28 SURGERY — ARTHROSCOPY, KNEE, WITH MEDIAL MENISCECTOMY
Anesthesia: General | Site: Knee | Laterality: Left

## 2023-04-28 MED ORDER — ROCURONIUM BROMIDE 10 MG/ML (PF) SYRINGE
PREFILLED_SYRINGE | INTRAVENOUS | Status: DC | PRN
  Administered 2023-04-28: 50 mg via INTRAVENOUS

## 2023-04-28 MED ORDER — EPINEPHRINE PF 1 MG/ML IJ SOLN
INTRAMUSCULAR | Status: AC
  Filled 2023-04-28: qty 4

## 2023-04-28 MED ORDER — FENTANYL CITRATE (PF) 100 MCG/2ML IJ SOLN
INTRAMUSCULAR | Status: AC
  Filled 2023-04-28: qty 2

## 2023-04-28 MED ORDER — IBUPROFEN 400 MG PO TABS
400.0000 mg | ORAL_TABLET | Freq: Once | ORAL | Status: AC
  Administered 2023-04-28: 400 mg via ORAL
  Filled 2023-04-28: qty 1

## 2023-04-28 MED ORDER — FENTANYL CITRATE (PF) 250 MCG/5ML IJ SOLN
INTRAMUSCULAR | Status: DC | PRN
  Administered 2023-04-28 (×3): 50 ug via INTRAVENOUS

## 2023-04-28 MED ORDER — LACTATED RINGERS IV SOLN: INTRAVENOUS | Status: DC

## 2023-04-28 MED ORDER — LIDOCAINE 2% (20 MG/ML) 5 ML SYRINGE
INTRAMUSCULAR | Status: DC | PRN
  Administered 2023-04-28: 100 mg via INTRAVENOUS

## 2023-04-28 MED ORDER — PHENYLEPHRINE 80 MCG/ML (10ML) SYRINGE FOR IV PUSH (FOR BLOOD PRESSURE SUPPORT)
PREFILLED_SYRINGE | INTRAVENOUS | Status: DC | PRN
  Administered 2023-04-28 (×2): 160 ug via INTRAVENOUS

## 2023-04-28 MED ORDER — OXYCODONE HCL 5 MG/5ML PO SOLN: 5.0000 mg | Freq: Once | ORAL | Status: DC | PRN

## 2023-04-28 MED ORDER — OXYCODONE HCL 5 MG PO TABS
5.0000 mg | ORAL_TABLET | Freq: Once | ORAL | Status: AC
  Administered 2023-04-28: 5 mg via ORAL
  Filled 2023-04-28: qty 1

## 2023-04-28 MED ORDER — PROPOFOL 10 MG/ML IV BOLUS
INTRAVENOUS | Status: DC | PRN
  Administered 2023-04-28: 200 mg via INTRAVENOUS

## 2023-04-28 MED ORDER — DEXMEDETOMIDINE HCL IN NACL 80 MCG/20ML IV SOLN
INTRAVENOUS | Status: AC
  Filled 2023-04-28: qty 20

## 2023-04-28 MED ORDER — CEFAZOLIN IN SODIUM CHLORIDE 3-0.9 GM/100ML-% IV SOLN: 3.0000 g | INTRAVENOUS | Status: DC

## 2023-04-28 MED ORDER — PROPOFOL 10 MG/ML IV BOLUS
INTRAVENOUS | Status: AC
  Filled 2023-04-28: qty 20

## 2023-04-28 MED ORDER — CEFAZOLIN IN SODIUM CHLORIDE 3-0.9 GM/100ML-% IV SOLN
INTRAVENOUS | Status: AC
  Filled 2023-04-28: qty 100

## 2023-04-28 MED ORDER — DEXTROSE 5 % IV SOLN
INTRAVENOUS | Status: DC | PRN
  Administered 2023-04-28: 3 g via INTRAVENOUS

## 2023-04-28 MED ORDER — OXYCODONE HCL 5 MG PO TABS: 5.0000 mg | ORAL_TABLET | Freq: Four times a day (QID) | ORAL | 0 refills | Status: AC | PRN

## 2023-04-28 MED ORDER — SODIUM CHLORIDE 0.9 % IR SOLN
Status: DC | PRN
  Administered 2023-04-28: 1000 mL

## 2023-04-28 MED ORDER — MIDAZOLAM HCL 2 MG/2ML IJ SOLN
INTRAMUSCULAR | Status: DC | PRN
  Administered 2023-04-28: 2 mg via INTRAVENOUS

## 2023-04-28 MED ORDER — SODIUM CHLORIDE 0.9 % IR SOLN
Status: DC | PRN
  Administered 2023-04-28 (×3): 3000 mL

## 2023-04-28 MED ORDER — ONDANSETRON HCL 4 MG/2ML IJ SOLN: 4.0000 mg | Freq: Once | INTRAMUSCULAR | Status: DC | PRN

## 2023-04-28 MED ORDER — FENTANYL CITRATE PF 50 MCG/ML IJ SOSY
25.0000 ug | PREFILLED_SYRINGE | INTRAMUSCULAR | Status: DC | PRN
  Administered 2023-04-28: 50 ug via INTRAVENOUS
  Filled 2023-04-28: qty 1

## 2023-04-28 MED ORDER — BUPIVACAINE HCL (PF) 0.5 % IJ SOLN
INTRAMUSCULAR | Status: AC
  Filled 2023-04-28: qty 30

## 2023-04-28 MED ORDER — CHLORHEXIDINE GLUCONATE 0.12 % MT SOLN
15.0000 mL | Freq: Once | OROMUCOSAL | Status: AC
  Administered 2023-04-28: 15 mL via OROMUCOSAL

## 2023-04-28 MED ORDER — ORAL CARE MOUTH RINSE: 15.0000 mL | Freq: Once | OROMUCOSAL | Status: AC

## 2023-04-28 MED ORDER — ACETAMINOPHEN 500 MG PO TABS: 500.0000 mg | ORAL_TABLET | Freq: Four times a day (QID) | ORAL | 0 refills | Status: DC | PRN

## 2023-04-28 MED ORDER — BUPIVACAINE HCL (PF) 0.5 % IJ SOLN
INTRAMUSCULAR | Status: DC | PRN
  Administered 2023-04-28: 30 mL

## 2023-04-28 MED ORDER — ROCURONIUM BROMIDE 10 MG/ML (PF) SYRINGE
PREFILLED_SYRINGE | INTRAVENOUS | Status: AC
  Filled 2023-04-28: qty 10

## 2023-04-28 MED ORDER — ACETAMINOPHEN 500 MG PO TABS
500.0000 mg | ORAL_TABLET | Freq: Once | ORAL | Status: AC
  Administered 2023-04-28: 500 mg via ORAL
  Filled 2023-04-28: qty 1

## 2023-04-28 MED ORDER — OXYCODONE HCL 5 MG PO TABS: 5.0000 mg | ORAL_TABLET | Freq: Once | ORAL | Status: DC | PRN

## 2023-04-28 MED ORDER — ONDANSETRON HCL 4 MG/2ML IJ SOLN
INTRAMUSCULAR | Status: DC | PRN
  Administered 2023-04-28: 4 mg via INTRAVENOUS

## 2023-04-28 MED ORDER — DEXMEDETOMIDINE HCL IN NACL 80 MCG/20ML IV SOLN
INTRAVENOUS | Status: DC | PRN
  Administered 2023-04-28 (×2): 8 ug via INTRAVENOUS

## 2023-04-28 MED ORDER — METHYLPREDNISOLONE SODIUM SUCC 125 MG IJ SOLR
40.0000 mg | Freq: Once | INTRAMUSCULAR | Status: AC
  Administered 2023-04-28: 40 mg via INTRAVENOUS
  Filled 2023-04-28: qty 2

## 2023-04-28 MED ORDER — SUGAMMADEX SODIUM 200 MG/2ML IV SOLN
INTRAVENOUS | Status: DC | PRN
  Administered 2023-04-28: 200 mg via INTRAVENOUS

## 2023-04-28 MED ORDER — MIDAZOLAM HCL 2 MG/2ML IJ SOLN
INTRAMUSCULAR | Status: AC
  Filled 2023-04-28: qty 2

## 2023-04-28 MED ORDER — LIDOCAINE HCL (PF) 2 % IJ SOLN
INTRAMUSCULAR | Status: AC
  Filled 2023-04-28: qty 5

## 2023-04-28 MED ORDER — ONDANSETRON HCL 4 MG/2ML IJ SOLN
INTRAMUSCULAR | Status: AC
  Filled 2023-04-28: qty 2

## 2023-04-28 MED ORDER — IBUPROFEN 800 MG PO TABS: 800.0000 mg | ORAL_TABLET | Freq: Three times a day (TID) | ORAL | 1 refills | Status: AC | PRN

## 2023-04-28 SURGICAL SUPPLY — 57 items
ABLATOR ASPIRATE 50D MULTI-PRT (SURGICAL WAND) IMPLANT
ANCH SUT 2-0 CVD FBRSTCH PLSTR (Anchor) ×1 IMPLANT
APL PRP STRL LF DISP 70% ISPRP (MISCELLANEOUS) ×1
BAG HAMPER (MISCELLANEOUS) ×1 IMPLANT
BLADE SHAVER TORPEDO 4X13 (MISCELLANEOUS) IMPLANT
BLADE SURG SZ11 CARB STEEL (BLADE) ×1 IMPLANT
BNDG CMPR MED 10X6 ELC LF (GAUZE/BANDAGES/DRESSINGS) ×1
BNDG CMPR STD VLCR NS LF 5.8X6 (GAUZE/BANDAGES/DRESSINGS) ×1
BNDG ELASTIC 6X10 VLCR STRL LF (GAUZE/BANDAGES/DRESSINGS) IMPLANT
BNDG ELASTIC 6X5.8 VLCR NS LF (GAUZE/BANDAGES/DRESSINGS) ×1 IMPLANT
CHLORAPREP W/TINT 26 (MISCELLANEOUS) ×1 IMPLANT
CLOTH BEACON ORANGE TIMEOUT ST (SAFETY) ×1 IMPLANT
COOLER ICEMAN CLASSIC (MISCELLANEOUS) ×1 IMPLANT
COVER LIGHT HANDLE STERIS (MISCELLANEOUS) ×2 IMPLANT
CUFF TOURN SGL QUICK 42 (TOURNIQUET CUFF) IMPLANT
CUTTER TENSIONER SUT 2-0 0 FBW (INSTRUMENTS) IMPLANT
DECANTER SPIKE VIAL GLASS SM (MISCELLANEOUS) ×2 IMPLANT
DRAPE HALF SHEET 40X57 (DRAPES) ×1 IMPLANT
GAUZE 4X4 16PLY ~~LOC~~+RFID DBL (SPONGE) ×1 IMPLANT
GAUZE SPONGE 4X4 12PLY STRL (GAUZE/BANDAGES/DRESSINGS) ×1 IMPLANT
GAUZE XEROFORM 1X8 LF (GAUZE/BANDAGES/DRESSINGS) IMPLANT
GAUZE XEROFORM 5X9 LF (GAUZE/BANDAGES/DRESSINGS) ×1 IMPLANT
GLOVE BIOGEL PI IND STRL 7.0 (GLOVE) ×2 IMPLANT
GLOVE SS N UNI LF 8.5 STRL (GLOVE) ×1 IMPLANT
GLOVE SURG POLYISO LF SZ8 (GLOVE) ×1 IMPLANT
GOWN STRL REUS W/TWL LRG LVL3 (GOWN DISPOSABLE) ×1 IMPLANT
GOWN STRL REUS W/TWL XL LVL3 (GOWN DISPOSABLE) ×1 IMPLANT
IMPL FIBERSTICH 2-0 CVD (Anchor) IMPLANT
IMPLANT FIBERSTICH 2-0 CVD (Anchor) ×1 IMPLANT
IV NS IRRIG 3000ML ARTHROMATIC (IV SOLUTION) ×2 IMPLANT
KIT BLADEGUARD II DBL (SET/KITS/TRAYS/PACK) ×1 IMPLANT
KIT TURNOVER CYSTO (KITS) ×1 IMPLANT
MANIFOLD NEPTUNE II (INSTRUMENTS) ×1 IMPLANT
MARKER SKIN DUAL TIP RULER LAB (MISCELLANEOUS) ×1 IMPLANT
NDL HYPO 18GX1.5 BLUNT FILL (NEEDLE) ×1 IMPLANT
NDL HYPO 21X1.5 SAFETY (NEEDLE) ×1 IMPLANT
NDL SPNL 18GX3.5 QUINCKE PK (NEEDLE) ×1 IMPLANT
NEEDLE HYPO 18GX1.5 BLUNT FILL (NEEDLE) ×1 IMPLANT
NEEDLE HYPO 21X1.5 SAFETY (NEEDLE) ×1 IMPLANT
NEEDLE SPNL 18GX3.5 QUINCKE PK (NEEDLE) ×1 IMPLANT
NS IRRIG 1000ML POUR BTL (IV SOLUTION) ×1 IMPLANT
PACK ARTHROSCOPY WL (CUSTOM PROCEDURE TRAY) ×1 IMPLANT
PAD ABD 5X9 TENDERSORB (GAUZE/BANDAGES/DRESSINGS) ×1 IMPLANT
PAD ARMBOARD 7.5X6 YLW CONV (MISCELLANEOUS) ×1 IMPLANT
PAD CAST 4YDX4 CTTN HI CHSV (CAST SUPPLIES) IMPLANT
PAD COLD SHLDR SM WRAP-ON (PAD) IMPLANT
PAD FOR LEG HOLDER (MISCELLANEOUS) ×1 IMPLANT
PADDING CAST COTTON 4X4 STRL (CAST SUPPLIES) ×1
PADDING CAST COTTON 6X4 STRL (CAST SUPPLIES) ×1 IMPLANT
PORT APPOLLO RF 90DEGREE MULTI (SURGICAL WAND) IMPLANT
SET ARTHROSCOPY INST (INSTRUMENTS) ×1 IMPLANT
SET BASIN LINEN APH (SET/KITS/TRAYS/PACK) ×1 IMPLANT
SUT ETHILON 3 0 FSL (SUTURE) ×1 IMPLANT
SYR 10ML LL (SYRINGE) ×1 IMPLANT
SYR 30ML LL (SYRINGE) ×2 IMPLANT
TUBE CONNECTING 12X1/4 (SUCTIONS) ×2 IMPLANT
TUBING IN/OUT FLOW W/MAIN PUMP (TUBING) ×1 IMPLANT

## 2023-04-28 NOTE — Brief Op Note (Signed)
04/28/2023  10:10 AM  PATIENT:  Gary Grimes  62 y.o. male  PRE-OPERATIVE DIAGNOSIS:  torn medial meniscus left knee  POST-OPERATIVE DIAGNOSIS:  torn medial meniscus left knee, CHONDROMALASIA  PROCEDURE:  Procedure(s): KNEE ARTHROSCOPY WITH PARTIAL MEDIAL MENISCECTOMY (Left) KNEE ARTHROSCOPY WITH MEDIAL MENISCAL REPAIR (Left) CHONDROPLASTY PATELLA AND FEMUR (Left)  Findings  Posterior horn medial meniscus root tear radial configuration  Stretching of the posteromedial meniscocapsular junction  Chondral lesion medial femoral condyle 2 x 2 cm  Chondromalacia median ridge and medial facet patella  All of the remaining structures were normal including ACL PCL lateral compartment  Implant Arthrex meniscus repair suture/fiber stitch   SURGEON:  Surgeon(s) and Role:    Vickki Hearing, MD - Primary  PHYSICIAN ASSISTANT:   ASSISTANTS: Carlos American   ANESTHESIA:   general  EBL:  minimal   BLOOD ADMINISTERED:none  DRAINS: none   LOCAL MEDICATIONS USED:  MARCAINE     SPECIMEN:  No Specimen  DISPOSITION OF SPECIMEN:  N/A  COUNTS:  YES  TOURNIQUET:  * Missing tourniquet times found for documented tourniquets in log: [id number] *  DICTATION: .Dragon Dictation  PLAN OF CARE: Discharge to home after PACU  PATIENT DISPOSITION:  PACU - hemodynamically stable.   Delay start of Pharmacological VTE agent (>24hrs) due to surgical blood loss or risk of bleeding: not applicable

## 2023-04-28 NOTE — Interval H&P Note (Signed)
History and Physical Interval Note:  04/28/2023 8:34 AM  Gary Grimes  has presented today for surgery, with the diagnosis of torn medial meniscus left knee.  The various methods of treatment have been discussed with the patient and family. After consideration of risks, benefits and other options for treatment, the patient has consented to  Procedure(s): KNEE ARTHROSCOPY WITH MEDIAL MENISCECTOMY (Left) as a surgical intervention.  The patient's history has been reviewed, patient examined, no change in status, stable for surgery.  I have reviewed the patient's chart and labs.  Questions were answered to the patient's satisfaction.     Fuller Canada

## 2023-04-28 NOTE — Op Note (Signed)
04/28/2023  10:10 AM  PATIENT:  Gary Grimes  62 y.o. male  PRE-OPERATIVE DIAGNOSIS:  torn medial meniscus left knee  POST-OPERATIVE DIAGNOSIS:  torn medial meniscus left knee, CHONDROMALACIA  Indications for procedure 62 year old male injured his left knee tried nonoperative care which included anti-inflammatory medication physical therapy bracing and some opioid medication as well he did not improve and opted to have surgery once we found that his meniscus was torn    PROCEDURE:  Procedure(s): KNEE ARTHROSCOPY WITH PARTIAL MEDIAL MENISCECTOMY (Left) KNEE ARTHROSCOPY WITH MEDIAL MENISCAL REPAIR (Left) CHONDROPLASTY PATELLA AND FEMUR (Left)  Findings  Posterior horn medial meniscus root tear radial configuration  Stretching of the posteromedial meniscocapsular junction  Chondral lesion medial femoral condyle 2 x 2 cm  Chondromalacia median ridge and medial facet patella  All of the remaining structures were normal including ACL PCL lateral compartment  Implant Arthrex meniscus repair suture/fiber stitch  This is a was done  The patient was seen in the preop area the surgical site was confirmed and marked.  Chart review was completed including imaging.  The patient was taken the operating for general anesthesia in the supine position.  Arthroscopic leg holder was placed on the left thigh and the right leg was placed in a padded well-leg holder and placed in flexion  After timeout a lateral portal was established the scope was placed into the joint and a diagnostic arthroscopy was performed  The knee was normal except for the following findings  At the medial meniscal root radial tear was noted there was also stretching of the meniscocapsular junction just in front of this area.  There was a large chondral lesion of the medial femoral condyle.  There was chondromalacia of the patella and median ridge and medial facet.   We established a medial portal with a spinal needle  we opened up the medial compartment with stress on the medial collateral ligament and we removed the torn fragment of meniscus at the root.  The remaining meniscal capsular junction area was reevaluated.  There was not a complete tear but the area was definitely stretched.  This corresponded to a possible propagation of this lesion to a meniscocapsular junction tear which I felt was not appropriate for this patient.  This left the option of removing the meniscus to a stable rim under a large chondral lesion or trying to place a suture here to stabilize this area.  I opted for suture repair  We used the Arthrex fiber stitch and placed a horizontal mattress suture in this region to stabilize it and got a good repair.  We performed chondroplasty of the medial femoral condyle with a arthroscopic shaver and rasp and also performed a chondroplasty of the patella with the arthroscopic shaver  We irrigated the joint suctioned it free injected it with Marcaine and then closed the portals with 3-0 nylon    SURGEON:  Surgeon(s) and Role:    * Vickki Hearing, MD - Primary  PHYSICIAN ASSISTANT:   ASSISTANTS: Carlos American   ANESTHESIA:   general  EBL:  minimal   BLOOD ADMINISTERED:none  DRAINS: none   LOCAL MEDICATIONS USED:  MARCAINE     SPECIMEN:  No Specimen  DISPOSITION OF SPECIMEN:  N/A  COUNTS:  YES  TOURNIQUET:  * Missing tourniquet times found for documented tourniquets in log: [id number] *  DICTATION: .Dragon Dictation  PLAN OF CARE: Discharge to home after PACU  PATIENT DISPOSITION:  PACU - hemodynamically stable.   Delay  start of Pharmacological VTE agent (>24hrs) due to surgical blood loss or risk of bleeding: not applicable    Postop plan  Protected weightbearing with a brace and walker x 2 weeks  No limits on range of motion of the brace

## 2023-04-28 NOTE — Anesthesia Preprocedure Evaluation (Signed)
Anesthesia Evaluation  Patient identified by MRN, date of birth, ID band Patient awake    Reviewed: Allergy & Precautions, H&P , NPO status , Patient's Chart, lab work & pertinent test results, reviewed documented beta blocker date and time   Airway Mallampati: II  TM Distance: >3 FB Neck ROM: full    Dental no notable dental hx.    Pulmonary neg pulmonary ROS, shortness of breath, sleep apnea , former smoker   Pulmonary exam normal breath sounds clear to auscultation       Cardiovascular Exercise Tolerance: Good hypertension, + angina  + CAD  negative cardio ROS + dysrhythmias  Rhythm:regular Rate:Normal     Neuro/Psych  Headaches PSYCHIATRIC DISORDERS Anxiety Depression Bipolar Disorder    Neuromuscular disease negative neurological ROS  negative psych ROS   GI/Hepatic negative GI ROS, Neg liver ROS,GERD  ,,  Endo/Other  diabetes, Type 2  Morbid obesity  Renal/GU negative Renal ROS  negative genitourinary   Musculoskeletal   Abdominal   Peds  Hematology negative hematology ROS (+)   Anesthesia Other Findings   Reproductive/Obstetrics negative OB ROS                             Anesthesia Physical Anesthesia Plan  ASA: 3  Anesthesia Plan: General and General ETT   Post-op Pain Management:    Induction:   PONV Risk Score and Plan: Ondansetron  Airway Management Planned:   Additional Equipment:   Intra-op Plan:   Post-operative Plan:   Informed Consent: I have reviewed the patients History and Physical, chart, labs and discussed the procedure including the risks, benefits and alternatives for the proposed anesthesia with the patient or authorized representative who has indicated his/her understanding and acceptance.     Dental Advisory Given  Plan Discussed with: CRNA  Anesthesia Plan Comments:        Anesthesia Quick Evaluation

## 2023-04-28 NOTE — Anesthesia Procedure Notes (Signed)
Procedure Name: Intubation Date/Time: 04/28/2023 8:56 AM  Performed by: Julian Reil, CRNAPre-anesthesia Checklist: Patient identified, Emergency Drugs available, Suction available and Patient being monitored Patient Re-evaluated:Patient Re-evaluated prior to induction Oxygen Delivery Method: Circle system utilized Preoxygenation: Pre-oxygenation with 100% oxygen Induction Type: IV induction Ventilation: Mask ventilation without difficulty and Oral airway inserted - appropriate to patient size Laryngoscope Size: Glidescope and 3 Grade View: Grade I Tube type: Oral Tube size: 7.5 mm Number of attempts: 1 Airway Equipment and Method: Stylet and Video-laryngoscopy Placement Confirmation: ETT inserted through vocal cords under direct vision, positive ETCO2 and breath sounds checked- equal and bilateral Secured at: 22 cm Tube secured with: Tape Dental Injury: Teeth and Oropharynx as per pre-operative assessment  Comments: Glidescope electively used due to large tongue, short thick neck and H/O OSA, CPAP use.

## 2023-04-28 NOTE — Interval H&P Note (Signed)
History and Physical Interval Note:  04/28/2023 8:34 AM  Gary Grimes  has presented today for surgery, with the diagnosis of torn medial meniscus left knee.  The various methods of treatment have been discussed with the patient and family. After consideration of risks, benefits and other options for treatment, the patient has consented to  Procedure(s): KNEE ARTHROSCOPY WITH MEDIAL MENISCECTOMY (Left) as a surgical intervention.  The patient's history has been reviewed, patient examined, no change in status, stable for surgery.  I have reviewed the patient's chart and labs.  Questions were answered to the patient's satisfaction.     [name]   

## 2023-04-28 NOTE — Transfer of Care (Signed)
Immediate Anesthesia Transfer of Care Note  Patient: Gary Grimes  Procedure(s) Performed: KNEE ARTHROSCOPY WITH PARTIAL MEDIAL MENISCECTOMY (Left: Knee) KNEE ARTHROSCOPY WITH MEDIAL MENISCAL REPAIR (Left: Knee) CHONDROPLASTY PATELLA AND FEMUR (Left: Knee)  Patient Location: PACU  Anesthesia Type:General  Level of Consciousness: awake  Airway & Oxygen Therapy: Patient Spontanous Breathing and Patient connected to face mask oxygen  Post-op Assessment: Report given to RN and Post -op Vital signs reviewed and stable  Post vital signs: Reviewed and stable  Last Vitals:  Vitals Value Taken Time  BP    Temp    Pulse    Resp    SpO2      Last Pain:  Vitals:   04/28/23 0806  TempSrc: Oral  PainSc: 7       Patients Stated Pain Goal: 8 (04/28/23 0806)  Complications: No notable events documented.

## 2023-04-29 ENCOUNTER — Telehealth: Payer: Self-pay | Admitting: Orthopedic Surgery

## 2023-04-29 ENCOUNTER — Ambulatory Visit: Payer: BC Managed Care – PPO | Admitting: Orthopedic Surgery

## 2023-04-29 NOTE — Telephone Encounter (Signed)
Dr. Mort Sawyers pt - pt lvm stating he had surgery yesterday and that he was given an ice machine but it's not longer getting cold.  [phone]

## 2023-04-29 NOTE — Telephone Encounter (Signed)
I called him he figured it out. It is cooling properly now.

## 2023-04-29 NOTE — Progress Notes (Signed)
Patient had questions with post op call, reviewed how to set up cryo cuff system,ambulation instructions, dressing removal and care instructions  Patient verbalized understanding and states cryo cuff is now cool and working properly.

## 2023-04-30 NOTE — Anesthesia Postprocedure Evaluation (Signed)
Anesthesia Post Note  Patient: Gary Grimes  Procedure(s) Performed: KNEE ARTHROSCOPY WITH PARTIAL MEDIAL MENISCECTOMY (Left: Knee) KNEE ARTHROSCOPY WITH MEDIAL MENISCAL REPAIR (Left: Knee) CHONDROPLASTY PATELLA AND FEMUR (Left: Knee)  Patient location during evaluation: Phase II Anesthesia Type: General Level of consciousness: awake Pain management: pain level controlled Vital Signs Assessment: post-procedure vital signs reviewed and stable Respiratory status: spontaneous breathing and respiratory function stable Cardiovascular status: blood pressure returned to baseline and stable Postop Assessment: no headache and no apparent nausea or vomiting Anesthetic complications: no Comments: Late entry   No notable events documented.   Last Vitals:  Vitals:   04/28/23 1100 04/28/23 1120  BP: 115/66 124/79  Pulse: (!) 57 (!) 57  Resp: (!) 8 16  Temp:  36.5 C  SpO2: 95% 98%    Last Pain:  Vitals:   04/29/23 1104  TempSrc:   PainSc: 3                  Windell Norfolk

## 2023-05-02 ENCOUNTER — Other Ambulatory Visit: Payer: Self-pay | Admitting: Family Medicine

## 2023-05-02 DIAGNOSIS — I10 Essential (primary) hypertension: Secondary | ICD-10-CM

## 2023-05-02 NOTE — Telephone Encounter (Signed)
Med that pt. Asked for got called into pharmacy closing the encounter

## 2023-05-03 ENCOUNTER — Encounter (HOSPITAL_COMMUNITY): Payer: Self-pay | Admitting: Orthopedic Surgery

## 2023-05-03 ENCOUNTER — Ambulatory Visit: Payer: BC Managed Care – PPO | Admitting: Nurse Practitioner

## 2023-05-03 ENCOUNTER — Ambulatory Visit (INDEPENDENT_AMBULATORY_CARE_PROVIDER_SITE_OTHER): Payer: BC Managed Care – PPO | Admitting: Orthopedic Surgery

## 2023-05-03 VITALS — BP 138/74 | HR 69 | Temp 97.5°F | Ht 71.0 in | Wt 325.0 lb

## 2023-05-03 DIAGNOSIS — L089 Local infection of the skin and subcutaneous tissue, unspecified: Secondary | ICD-10-CM

## 2023-05-03 DIAGNOSIS — L03211 Cellulitis of face: Secondary | ICD-10-CM

## 2023-05-03 DIAGNOSIS — M1712 Unilateral primary osteoarthritis, left knee: Secondary | ICD-10-CM

## 2023-05-03 MED ORDER — DOXYCYCLINE HYCLATE 100 MG PO TABS
100.0000 mg | ORAL_TABLET | Freq: Two times a day (BID) | ORAL | 1 refills | Status: DC
Start: 2023-05-03 — End: 2023-07-22

## 2023-05-03 MED ORDER — MUPIROCIN 2 % EX OINT: 1.0000 | TOPICAL_OINTMENT | Freq: Two times a day (BID) | CUTANEOUS | 0 refills | Status: DC

## 2023-05-03 MED ORDER — MELOXICAM 7.5 MG PO TABS
7.5000 mg | ORAL_TABLET | Freq: Every day | ORAL | 0 refills | Status: DC
Start: 2023-05-03 — End: 2023-05-31

## 2023-05-03 NOTE — Progress Notes (Signed)
   Acute Office Visit  Subjective:     Patient ID: Gary Grimes, male    DOB: [birth year], 62 y.o.   MRN: [id number]  Chief Complaint  Patient presents with   Rash    Irritation under right eye for 2 weeks    HPI Patient is in today for irritation under his right eye for 2 weeks. The area is red, has a scab in the middle, and has been slightly draining. He changed his CPAP mask and thinks it may be from his mask. He denies fevers. The area is spreading and getting closer to his eye.   ROS See pertinent positives and negatives per HPI.     Objective:    BP 138/74 (BP Location: Right Arm, Cuff Size: Large)   Pulse 69   Temp (!) 97.5 F (36.4 C)   Ht 5\' 11"  (1.803 m)   Wt (!) 325 lb (147.4 kg)   SpO2 96%   BMI 45.33 kg/m    Physical Exam Vitals and nursing note reviewed.  Constitutional:      Appearance: Normal appearance.  HENT:     Head: Normocephalic.  Eyes:     Conjunctiva/sclera: Conjunctivae normal.  Pulmonary:     Effort: Pulmonary effort is normal.  Musculoskeletal:     Cervical back: Normal range of motion.  Skin:    General: Skin is warm.     Comments: Red, partially scabbed skin infection to right cheek  Neurological:     General: No focal deficit present.     Mental Status: He is alert and oriented to person, place, and time.  Psychiatric:        Mood and Affect: Mood normal.        Behavior: Behavior normal.        Thought Content: Thought content normal.        Judgment: Judgment normal.       Assessment & Plan:   Problem List Items Addressed This Visit   None Visit Diagnoses     Skin infection    -  Primary   To right cheek. Wash area twice a day with soap and water and apply thin layer of mupirocin ointment. Wear non-aderhant pad under CPAP. F/U if not improving.   Relevant Medications   mupirocin ointment (BACTROBAN) 2 %       Meds ordered this encounter  Medications   mupirocin ointment (BACTROBAN) 2 %    Sig: Apply 1  Application topically 2 (two) times daily.    Dispense:  22 g    Refill:  0    Return if symptoms worsen or fail to improve.  Gerre Scull, NP

## 2023-05-03 NOTE — Patient Instructions (Signed)
It was great to see you!  Wash your face twice a day with soap and water. Then apply a thin layer of mupirocin ointment twice a day. Wear a bandaid or non adherent gauze inbetween your irritation and CPAP.   Let's follow-up if your symptoms worsen or don't improve.   Take care,  Rodman Pickle, NP

## 2023-05-03 NOTE — Progress Notes (Signed)
   This is a postop visit  No diagnosis found.  Chief Complaint  Patient presents with   Routine Post Op    SALK DOS 04/28/23- sutures removed without difficulty wounds look good no redness noted.     The patient is status post partial medial meniscectomy at the root and a posterior root meniscal repair ramp lesion  This is postop day number 5  Postop pain control postop pain control was initiated with oxycodone 5 mg #20  Subjective complaints mild pain  Physical exam findings portals clean sutures removed knee flexion 90 degrees  Assessment and plan  Again we need to go slow with his rehab he now has a meniscal repair suture in place and although I will not put him on full meniscal repair restrictions we need to keep him at a slow pace for recovery  Exercises can be initiated 0 to 90 degrees of flexion quadricep sets straight leg raises terminal knee extension  Follow-up in 2 weeks from surgery  He is going to start quadriceps exercises  We put him on some doxycycline for facial cellulitis on the right side he will follow-up with primary care for that we started him on a arthritis medication meloxicam and we gave him a handicap sticker  Meds ordered this encounter  Medications   doxycycline (VIBRA-TABS) 100 MG tablet    Sig: Take 1 tablet (100 mg total) by mouth 2 (two) times daily.    Dispense:  28 tablet    Refill:  1   meloxicam (MOBIC) 7.5 MG tablet    Sig: Take 1 tablet (7.5 mg total) by mouth daily.    Dispense:  30 tablet    Refill:  0

## 2023-05-05 ENCOUNTER — Encounter: Payer: BC Managed Care – PPO | Admitting: Orthopedic Surgery

## 2023-05-05 DIAGNOSIS — E785 Hyperlipidemia, unspecified: Secondary | ICD-10-CM | POA: Diagnosis not present

## 2023-05-05 DIAGNOSIS — R5382 Chronic fatigue, unspecified: Secondary | ICD-10-CM | POA: Diagnosis not present

## 2023-05-05 DIAGNOSIS — I10 Essential (primary) hypertension: Secondary | ICD-10-CM | POA: Diagnosis not present

## 2023-05-05 DIAGNOSIS — E669 Obesity, unspecified: Secondary | ICD-10-CM | POA: Diagnosis not present

## 2023-05-10 ENCOUNTER — Encounter: Payer: BC Managed Care – PPO | Admitting: Orthopedic Surgery

## 2023-05-11 ENCOUNTER — Telehealth: Payer: Self-pay | Admitting: Family Medicine

## 2023-05-11 DIAGNOSIS — E669 Obesity, unspecified: Secondary | ICD-10-CM | POA: Diagnosis not present

## 2023-05-11 DIAGNOSIS — R5382 Chronic fatigue, unspecified: Secondary | ICD-10-CM | POA: Diagnosis not present

## 2023-05-11 DIAGNOSIS — I10 Essential (primary) hypertension: Secondary | ICD-10-CM | POA: Diagnosis not present

## 2023-05-11 DIAGNOSIS — E785 Hyperlipidemia, unspecified: Secondary | ICD-10-CM | POA: Diagnosis not present

## 2023-05-11 NOTE — Telephone Encounter (Signed)
Spoke to provider since taking 40 mg Olmasartan.  Patient scheduled or appointment 5/20/@ 2 pm.  Dm/cma

## 2023-05-11 NOTE — Telephone Encounter (Signed)
Pt need increase on his blood pressure medication because he said his blood pressure been high the last couple of weeks

## 2023-05-11 NOTE — Telephone Encounter (Signed)
Spoke to patient and he reports his BP being anywhere from 165/98 - 190/110 for the last couple weeks.  He had surgery 2 weeks ago.   Please review and advise.   Thanks.  Dm/cma

## 2023-05-13 ENCOUNTER — Encounter: Payer: Self-pay | Admitting: Orthopedic Surgery

## 2023-05-13 ENCOUNTER — Ambulatory Visit (INDEPENDENT_AMBULATORY_CARE_PROVIDER_SITE_OTHER): Payer: BC Managed Care – PPO | Admitting: Orthopedic Surgery

## 2023-05-13 DIAGNOSIS — Z9889 Other specified postprocedural states: Secondary | ICD-10-CM

## 2023-05-13 NOTE — Progress Notes (Signed)
Postop appointment status post arthroscopy left knee  Chief Complaint  Patient presents with   Post-op Follow-up  left knee SALM, MM, MR     04/28/23 improving walking/ moving knee better    Encounter Diagnosis  Name Primary?   S/P left knee arthroscopy 04/28/23 medial meniscal repair Yes    We are taking it very slowly with Gary Grimes because he had some arthritis in the knee joint  We did a partial medial meniscectomy we did a medial meniscal ramp lesion repair and a chondroplasty of the patella and femur  His range of motion is set for 0-90 for the first 6 weeks (June 14), we started some quadriceps sets and straight leg raise and terminal knee extensions  We also started meloxicam 7.5 mg daily  Gary Grimes continues to improve he is ready to move to a cane his knee looks good he can bend it about 100 degrees he has good quadriceps control with extension minimal swelling  He can start using an exercise bike he can try to transfer to a cane now follow-up in 4 weeks he will continue his exercises that he has been doing to strengthen his quadriceps and continue to take it easy

## 2023-05-17 ENCOUNTER — Ambulatory Visit: Payer: BC Managed Care – PPO | Admitting: Family Medicine

## 2023-05-17 ENCOUNTER — Encounter: Payer: Self-pay | Admitting: Family Medicine

## 2023-05-17 VITALS — BP 130/76 | HR 76 | Temp 98.5°F | Ht 71.0 in | Wt 307.0 lb

## 2023-05-17 DIAGNOSIS — I10 Essential (primary) hypertension: Secondary | ICD-10-CM

## 2023-05-17 DIAGNOSIS — Z9889 Other specified postprocedural states: Secondary | ICD-10-CM | POA: Diagnosis not present

## 2023-05-17 DIAGNOSIS — F900 Attention-deficit hyperactivity disorder, predominantly inattentive type: Secondary | ICD-10-CM

## 2023-05-17 DIAGNOSIS — Z6841 Body Mass Index (BMI) 40.0 and over, adult: Secondary | ICD-10-CM
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
## 2023-05-17 NOTE — Assessment & Plan Note (Signed)
Blood pressure is in good control today. Continue olmesartan 40 mg daily. Recommend we continue to monitor this for now.

## 2023-05-17 NOTE — Assessment & Plan Note (Signed)
Weight is down 18 lbs. He will continue to follow with [facility].
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
## 2023-05-17 NOTE — Assessment & Plan Note (Signed)
Continue atomexetine 40 mg daily. We could consider Adderall in the future, when he is no longer on phentermine.

## 2023-05-19 ENCOUNTER — Telehealth: Payer: Self-pay | Admitting: Family Medicine

## 2023-05-19 NOTE — Telephone Encounter (Signed)
Spoke tp patient and he was calling in regards to the form received from [employer].  He is out of his phentermine and needs that form faxed back to get more from them.  Dm/cma

## 2023-05-19 NOTE — Telephone Encounter (Signed)
Pt called and stated that he need his weight loss prescription faxed over and he been waiting for the medication  for a while . Patient said he need it stat /urgent. Patient said call him if you have any questions

## 2023-05-19 NOTE — Telephone Encounter (Signed)
Left detailed VM to rtn call to discuss meds and form that was received from [facility].  Dm/cma

## 2023-05-19 NOTE — Telephone Encounter (Signed)
Patient notified VIA phone that form was faxed to [phone].  Dm/cma

## 2023-05-20 ENCOUNTER — Telehealth: Payer: Self-pay | Admitting: Orthopedic Surgery

## 2023-05-20 ENCOUNTER — Encounter: Payer: Self-pay | Admitting: Pulmonary Disease

## 2023-05-20 ENCOUNTER — Ambulatory Visit: Payer: BC Managed Care – PPO | Admitting: Pulmonary Disease

## 2023-05-20 ENCOUNTER — Other Ambulatory Visit: Payer: Self-pay | Admitting: Orthopedic Surgery

## 2023-05-20 VITALS — BP 124/72 | HR 86 | Ht 71.0 in | Wt 309.2 lb

## 2023-05-20 DIAGNOSIS — F3110 Bipolar disorder, current episode manic without psychotic features, unspecified: Secondary | ICD-10-CM | POA: Diagnosis not present

## 2023-05-20 DIAGNOSIS — F5104 Psychophysiologic insomnia: Secondary | ICD-10-CM

## 2023-05-20 DIAGNOSIS — G4733 Obstructive sleep apnea (adult) (pediatric): Secondary | ICD-10-CM

## 2023-05-20 DIAGNOSIS — M62838 Other muscle spasm: Secondary | ICD-10-CM

## 2023-05-20 MED ORDER — PREDNISONE 10 MG (48) PO TBPK
ORAL_TABLET | Freq: Every day | ORAL | 0 refills | Status: DC
Start: 2023-05-20 — End: 2023-06-02

## 2023-05-20 MED ORDER — DIAZEPAM 5 MG PO TABS
5.0000 mg | ORAL_TABLET | Freq: Four times a day (QID) | ORAL | 0 refills | Status: DC | PRN
Start: 2023-05-20 — End: 2023-07-22

## 2023-05-20 NOTE — Telephone Encounter (Signed)
Patient is calling again about the neck pain see previous message

## 2023-05-20 NOTE — Telephone Encounter (Signed)
Dr. Mort Sawyers pt - I called and advised Dr. Mort Sawyers response, he stated that he's only using the cane now and thinks that is contributing to his pain.

## 2023-05-20 NOTE — Progress Notes (Signed)
Meds ordered this encounter  Medications   diazepam (VALIUM) 5 MG tablet    Sig: Take 1 tablet (5 mg total) by mouth every 6 (six) hours as needed for anxiety.    Dispense:  12 tablet    Refill:  0   predniSONE (STERAPRED UNI-PAK 48 TAB) 10 MG (48) TBPK tablet    Sig: Take by mouth daily. 10 mg 12 days as directed    Dispense:  48 tablet    Refill:  0

## 2023-05-20 NOTE — Telephone Encounter (Signed)
Since the crutches and cane are causing pain I have advised him to return to using the walker, he states he will   He will take the prednisone and muscle relaxers to you FYI

## 2023-05-20 NOTE — Telephone Encounter (Signed)
Dr. Mort Sawyers pt - pt lvm and I spoke w/him, he stated that he is in excruciating pain in his neck/shoulder.  He stated that he knows it's from using the crutches and having to change his gait and it has reactivated the cervical fx/bone spur.  He stated that he can't get in with the neurologist for a month and he can't wait that long.  He is wanting an injection.  He wanted to let Dr. Romeo Apple know for him to advise.

## 2023-05-20 NOTE — Patient Instructions (Signed)
I will see you in about 4 to 6 weeks  Make sure you keep the appointment with the psychiatrist tomorrow  Call with significant concerns  Stay on the Klonopin for now unless the psychiatrist says we should change it  Doxepin will be an option to use but I think we will have to wean down on some of the other medications you are taking  Make sure you call with any significant concerns or if you need any changes made

## 2023-05-20 NOTE — Telephone Encounter (Signed)
Gary Grimes is calling patient to advise Will you please send in the meds prednisone and muscle relaxer ?

## 2023-05-21 ENCOUNTER — Encounter: Payer: Self-pay | Admitting: Orthopedic Surgery

## 2023-05-21 ENCOUNTER — Telehealth: Payer: Self-pay | Admitting: Orthopedic Surgery

## 2023-05-21 NOTE — Telephone Encounter (Signed)
I spoke to patient he is feeling a bit better today with his neck with the prednisone valium and d/c crutches and cane. He states he has increased pain behind his knee I advised him often patients have that from swelling after surgery, He denies increased swelling or calf pain. I have advised him to rest it and ice it and if anything changes to let us know. He states he will.   To you FYI

## 2023-05-21 NOTE — Telephone Encounter (Signed)
Dr. Mort Sawyers pt - the patient has called and lvm stating he thinks the wrong medications have been called in, he is confused and would like clarification, please call him.

## 2023-05-27 ENCOUNTER — Telehealth: Payer: Self-pay | Admitting: Pulmonary Disease

## 2023-05-27 ENCOUNTER — Other Ambulatory Visit: Payer: Self-pay | Admitting: Pulmonary Disease

## 2023-05-27 ENCOUNTER — Encounter: Payer: Self-pay | Admitting: Pulmonary Disease

## 2023-05-27 MED ORDER — DOXEPIN HCL 6 MG PO TABS: 6.0000 mg | ORAL_TABLET | Freq: Every evening | ORAL | 1 refills | Status: DC

## 2023-05-27 NOTE — Telephone Encounter (Signed)
PT states Dr. Val Eagle has him on Clonazepam and he is currently exp "situational" depression and is suicidal. He feels this is because of this drug.   He has researched another drug he's like Dr. Val Eagle to RX in it's place. It is called Seroquel 25 mg.   Pharm: [facility] on [address]   Also, show should he wean off of the Clonazapam   [phone]

## 2023-05-27 NOTE — Progress Notes (Signed)
Call the office, struggling with his depression  He feels the clonazepam may be exacerbating his depression significantly  We had discussed this during the last office visit to discuss this further with his psychiatrist, he stated he has not had as much help with the office at present  We will try to wean him down on clonazepam to 1 mg Stop clonazepam after about 1 week  Prescription for doxepin sent to pharmacy  I did place a call to you to confirm but I think we got disconnected

## 2023-05-27 NOTE — Telephone Encounter (Signed)
Dr. Val Eagle please advise? See message from patient
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
## 2023-05-28 DIAGNOSIS — E785 Hyperlipidemia, unspecified: Secondary | ICD-10-CM | POA: Diagnosis not present

## 2023-05-28 DIAGNOSIS — I10 Essential (primary) hypertension: Secondary | ICD-10-CM | POA: Diagnosis not present

## 2023-05-28 DIAGNOSIS — E669 Obesity, unspecified: Secondary | ICD-10-CM | POA: Diagnosis not present

## 2023-05-28 DIAGNOSIS — E119 Type 2 diabetes mellitus without complications: Secondary | ICD-10-CM | POA: Diagnosis not present

## 2023-05-30 ENCOUNTER — Other Ambulatory Visit: Payer: Self-pay | Admitting: Orthopedic Surgery

## 2023-05-30 DIAGNOSIS — M1712 Unilateral primary osteoarthritis, left knee: Secondary | ICD-10-CM

## 2023-05-31 ENCOUNTER — Telehealth: Payer: Self-pay | Admitting: Family Medicine

## 2023-05-31 NOTE — Telephone Encounter (Signed)
Pt is wanting a script for Zoloft 50mg . He is having a hard time getting a hold of his psychiatrist. Please advise pt at [phone]

## 2023-06-01 NOTE — Telephone Encounter (Signed)
Dr. Sherron Monday with PT on phone.

## 2023-06-01 NOTE — Telephone Encounter (Signed)
Left detailed VM that we would need an office visit to fill that medication. Dm/cma

## 2023-06-01 NOTE — Telephone Encounter (Signed)
Has appt 06/02/23. Dm/cma /

## 2023-06-02 ENCOUNTER — Ambulatory Visit: Payer: BC Managed Care – PPO | Admitting: Family Medicine

## 2023-06-02 ENCOUNTER — Encounter: Payer: Self-pay | Admitting: Family Medicine

## 2023-06-02 ENCOUNTER — Telehealth: Payer: Self-pay | Admitting: Family Medicine

## 2023-06-02 VITALS — BP 130/72 | HR 94 | Temp 98.0°F | Ht 71.0 in | Wt 308.0 lb

## 2023-06-02 DIAGNOSIS — H8309 Labyrinthitis, unspecified ear: Secondary | ICD-10-CM | POA: Diagnosis not present

## 2023-06-02 DIAGNOSIS — Z7984 Long term (current) use of oral hypoglycemic drugs: Secondary | ICD-10-CM

## 2023-06-02 DIAGNOSIS — F319 Bipolar disorder, unspecified: Secondary | ICD-10-CM

## 2023-06-02 DIAGNOSIS — I10 Essential (primary) hypertension: Secondary | ICD-10-CM | POA: Diagnosis not present

## 2023-06-02 DIAGNOSIS — E1142 Type 2 diabetes mellitus with diabetic polyneuropathy: Secondary | ICD-10-CM | POA: Diagnosis not present

## 2023-06-02 MED ORDER — MECLIZINE HCL 25 MG PO TABS
25.0000 mg | ORAL_TABLET | Freq: Three times a day (TID) | ORAL | 0 refills | Status: DC | PRN
Start: 2023-06-02 — End: 2023-08-27

## 2023-06-02 MED ORDER — PREGABALIN 200 MG PO CAPS: ORAL_CAPSULE | ORAL | 1 refills | Status: DC

## 2023-06-02 MED ORDER — ONDANSETRON HCL 4 MG PO TABS: 4.0000 mg | ORAL_TABLET | Freq: Three times a day (TID) | ORAL | 0 refills | Status: DC | PRN
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
## 2023-06-02 NOTE — Assessment & Plan Note (Signed)
Continue to follow with Dr. Gypsy Decant. Continue cariprazine (Vraylar) 3 mg daily and lamotrigine 100 mg daily.

## 2023-06-02 NOTE — Assessment & Plan Note (Signed)
Blood pressure remains is in good control today. Continue olmesartan 40 mg daily. Recommend we continue to monitor this.

## 2023-06-02 NOTE — Telephone Encounter (Signed)
[facility] called to report this pt is not their pt.

## 2023-06-02 NOTE — Assessment & Plan Note (Signed)
Stable. I will renew his pregabalin.

## 2023-06-03 NOTE — Telephone Encounter (Signed)
Noted. Dm/cma  

## 2023-06-10 ENCOUNTER — Encounter: Payer: BC Managed Care – PPO | Admitting: Orthopedic Surgery

## 2023-06-11 ENCOUNTER — Ambulatory Visit (INDEPENDENT_AMBULATORY_CARE_PROVIDER_SITE_OTHER): Payer: BC Managed Care – PPO | Admitting: Orthopedic Surgery

## 2023-06-11 ENCOUNTER — Telehealth: Payer: Self-pay

## 2023-06-11 ENCOUNTER — Encounter: Payer: Self-pay | Admitting: Orthopedic Surgery

## 2023-06-11 VITALS — BP 139/86 | HR 73 | Ht 71.0 in | Wt 308.0 lb

## 2023-06-11 DIAGNOSIS — E669 Obesity, unspecified: Secondary | ICD-10-CM | POA: Diagnosis not present

## 2023-06-11 DIAGNOSIS — M1712 Unilateral primary osteoarthritis, left knee: Secondary | ICD-10-CM

## 2023-06-11 DIAGNOSIS — I10 Essential (primary) hypertension: Secondary | ICD-10-CM | POA: Diagnosis not present

## 2023-06-11 DIAGNOSIS — Z9889 Other specified postprocedural states: Secondary | ICD-10-CM

## 2023-06-11 DIAGNOSIS — G4733 Obstructive sleep apnea (adult) (pediatric): Secondary | ICD-10-CM | POA: Diagnosis not present

## 2023-06-11 DIAGNOSIS — E119 Type 2 diabetes mellitus without complications: Secondary | ICD-10-CM | POA: Diagnosis not present

## 2023-06-11 NOTE — Telephone Encounter (Signed)
Can you please and thank you do a PA on the Pregablin 200 mg capsules for patient? Thanks. Dm/cma   BIN# [id number] Group # [id number] ID# [id number]

## 2023-06-11 NOTE — Addendum Note (Signed)
Addended byCaffie Damme on: 06/11/2023 10:24 AM   Modules accepted: Orders

## 2023-06-11 NOTE — Telephone Encounter (Signed)
-----   Message from Caffie Damme, RT sent at 06/11/2023 10:24 AM EDT ----- Left knee hyaluronic acid injections

## 2023-06-11 NOTE — Progress Notes (Signed)
   This is a follow-up appointment  Encounter Diagnosis  Name Primary?   S/P left knee arthroscopy 04/28/23 medial meniscal repair Yes     Chief Complaint  Patient presents with   Post-op Follow-up    Left knee 04/28/23 improving     Gary Grimes  is improving he has a couple of things that are still bothering him.  He still has that pain in the back of his knee he still feels like the knee might give way and occasionally has some swelling if he is been on it for a long time  However, he has full extension and flexion of his knee and has some tenderness right in the medial fossa which I think is tibial nerve pain And hamstrings are nontender  No joint effusion today  Recommend he continue with meloxicam Hinged knee brace  Pre-CERT for Orthovisc for gel injection  Return to work July 1  Follow-up 6 weeks

## 2023-06-11 NOTE — Patient Instructions (Addendum)
Work note full duty 06/28/23  We will check which brand of Hyaluronic acid injections (there are several) your insurance covers. We will call you with price and schedule with you if insurance approves and you are okay with the out of pocket costs. If for any reason they will not cover or the out of pocket costs are high, we will discuss with you and let you know other options available. This process normally takes several weeks to hear back from [name] the insurance approval process takes time.

## 2023-06-11 NOTE — Telephone Encounter (Signed)
VOB submitted for Orthovisc, left knee  

## 2023-06-15 ENCOUNTER — Telehealth: Payer: Self-pay

## 2023-06-15 ENCOUNTER — Other Ambulatory Visit (HOSPITAL_COMMUNITY): Payer: Self-pay

## 2023-06-15 NOTE — Telephone Encounter (Signed)
Pharmacy Patient Advocate Encounter   Received notification that prior authorization for Pregabalin 200mg  is required/requested.   PA submitted to  [facility]  via CoverMyMeds Key  # [id number] Status is pending

## 2023-06-16 ENCOUNTER — Other Ambulatory Visit (HOSPITAL_COMMUNITY): Payer: Self-pay

## 2023-06-16 NOTE — Telephone Encounter (Signed)
Patient Advocate Encounter  Prior Authorization for Pregabalin 200mg  has been approved with [employer].    PA#  [id number] Effective dates: 06/16/23 through 06/13/24  Per [facility] test claim, copay for 90 days supply is $24   Placed a call to [facility] to notify of the approval. The pharmacist stated it was picked up using a discount card and she would re run it to the insurance and provide a refund.

## 2023-06-16 NOTE — Telephone Encounter (Signed)
Rx picked up per pharmacy they will call patient for refund on Rx due to patient paying with discount card before Rx was approved by insurance.

## 2023-06-18 ENCOUNTER — Telehealth: Payer: Self-pay | Admitting: Radiology

## 2023-06-18 NOTE — Telephone Encounter (Signed)
Please schedule patient for gel injections with Dr. Romeo Apple  All information has been put into patient's chart under the referrals tab. Thank you.

## 2023-06-18 NOTE — Telephone Encounter (Signed)
Checking status on auth of Orthovisc.  Please let patient know?  He wants to do these ASAP.  Thanks

## 2023-06-22 DIAGNOSIS — M47812 Spondylosis without myelopathy or radiculopathy, cervical region: Secondary | ICD-10-CM | POA: Diagnosis not present

## 2023-06-22 DIAGNOSIS — Z1331 Encounter for screening for depression: Secondary | ICD-10-CM | POA: Diagnosis not present

## 2023-06-24 ENCOUNTER — Telehealth: Payer: Self-pay | Admitting: Pulmonary Disease

## 2023-06-24 NOTE — Telephone Encounter (Signed)
Pt calling to get a Pre Auth sent in for doxepine

## 2023-06-25 ENCOUNTER — Other Ambulatory Visit (HOSPITAL_COMMUNITY): Payer: Self-pay

## 2023-06-25 DIAGNOSIS — I10 Essential (primary) hypertension: Secondary | ICD-10-CM | POA: Diagnosis not present

## 2023-06-25 DIAGNOSIS — E669 Obesity, unspecified: Secondary | ICD-10-CM | POA: Diagnosis not present

## 2023-06-25 DIAGNOSIS — E119 Type 2 diabetes mellitus without complications: Secondary | ICD-10-CM | POA: Diagnosis not present

## 2023-06-25 DIAGNOSIS — E785 Hyperlipidemia, unspecified: Secondary | ICD-10-CM | POA: Diagnosis not present

## 2023-06-25 NOTE — Telephone Encounter (Signed)
6mg  tablets are not on patients formulary. He has a closed formulary plan so this medication will not be covered.

## 2023-06-30 ENCOUNTER — Telehealth: Payer: Self-pay | Admitting: Orthopedic Surgery

## 2023-06-30 DIAGNOSIS — M1712 Unilateral primary osteoarthritis, left knee: Secondary | ICD-10-CM

## 2023-06-30 NOTE — Telephone Encounter (Signed)
Dr. Mort Sawyers pt - pt lvm at 4:45pm requesting a refill on Meloxicam 7.5mg , 30 quantity, daily to [facility] [place]

## 2023-07-01 MED ORDER — MELOXICAM 7.5 MG PO TABS: 7.5000 mg | ORAL_TABLET | Freq: Every day | ORAL | 0 refills | Status: DC

## 2023-07-02 ENCOUNTER — Encounter: Payer: Self-pay | Admitting: Pulmonary Disease

## 2023-07-02 ENCOUNTER — Other Ambulatory Visit: Payer: Self-pay | Admitting: Pulmonary Disease

## 2023-07-02 ENCOUNTER — Ambulatory Visit: Payer: BC Managed Care – PPO | Admitting: Pulmonary Disease

## 2023-07-02 ENCOUNTER — Ambulatory Visit: Payer: BC Managed Care – PPO | Admitting: Family Medicine

## 2023-07-02 ENCOUNTER — Telehealth: Payer: Self-pay | Admitting: Family Medicine

## 2023-07-02 VITALS — BP 136/78 | HR 72 | Ht 71.0 in | Wt 317.5 lb

## 2023-07-02 DIAGNOSIS — F3110 Bipolar disorder, current episode manic without psychotic features, unspecified: Secondary | ICD-10-CM

## 2023-07-02 DIAGNOSIS — G4733 Obstructive sleep apnea (adult) (pediatric): Secondary | ICD-10-CM | POA: Diagnosis not present

## 2023-07-02 DIAGNOSIS — F5104 Psychophysiologic insomnia: Secondary | ICD-10-CM

## 2023-07-02 MED ORDER — CLONAZEPAM 2 MG PO TABS: 2.0000 mg | ORAL_TABLET | Freq: Every day | ORAL | 1 refills | Status: DC
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
## 2023-07-02 NOTE — Telephone Encounter (Signed)
NS on 7/5 no reason letter sent

## 2023-07-02 NOTE — Patient Instructions (Signed)
I will see you back in about 4 months  Will send a prescription for the clonazepam 2 mg to pharmacy for you  Continue using your CPAP on a nightly basis, it does show that it continues to treat your sleep apnea well  Call us with significant concerns

## 2023-07-05 ENCOUNTER — Ambulatory Visit: Payer: BC Managed Care – PPO | Admitting: Orthopedic Surgery

## 2023-07-05 DIAGNOSIS — M1712 Unilateral primary osteoarthritis, left knee: Secondary | ICD-10-CM | POA: Diagnosis not present

## 2023-07-05 MED ORDER — HYALURONAN 30 MG/2ML IX SOSY
1.0000 mg | PREFILLED_SYRINGE | INTRA_ARTICULAR | Status: DC
  Administered 2023-07-05 – 2023-07-22 (×3): 1 mg via INTRA_ARTICULAR

## 2023-07-05 NOTE — Progress Notes (Signed)
Chief Complaint  Patient presents with   Knee Pain    #1 orthovisc injection left knee    And he had arthroscopic surgery on the left knee.  At the time of surgery and on the MRI prior to surgery we noticed arthritic changes in the joint.  He is here for his first Orthovisc injection in his left knee  Chief Complaint  Patient presents with   Knee Pain    #1 orthovisc injection left knee     Encounter Diagnosis  Name Primary?   Primary osteoarthritis of left knee Yes    The patient has consented to and requested hyaluronic acid injection   MEDICATION: ORTHOVISC   left  THE knee is prepped with alcohol and ethyl chloride  The injection is performed with a 21-gauge needle, via inferolateral approach  No complications were noted  Appropriate instructions post injection were given   in 1 week(s)

## 2023-07-05 NOTE — Patient Instructions (Signed)
You have received an injection into the joint. 15% of patients will have increased pain within the 24 hours postinjection.   This is transient and will go away.   We recommend that you use ice packs on the injection site for 20 minutes every 2 hours and extra strength Tylenol 2 tablets every 8 as needed until the pain resolves.  If you continue to have pain after taking the Tylenol and using the ice please call the office for further instructions.  

## 2023-07-07 DIAGNOSIS — E669 Obesity, unspecified: Secondary | ICD-10-CM | POA: Diagnosis not present

## 2023-07-07 DIAGNOSIS — E119 Type 2 diabetes mellitus without complications: Secondary | ICD-10-CM | POA: Diagnosis not present

## 2023-07-07 DIAGNOSIS — I10 Essential (primary) hypertension: Secondary | ICD-10-CM | POA: Diagnosis not present

## 2023-07-07 DIAGNOSIS — G4733 Obstructive sleep apnea (adult) (pediatric): Secondary | ICD-10-CM | POA: Diagnosis not present

## 2023-07-08 ENCOUNTER — Ambulatory Visit: Payer: BC Managed Care – PPO | Admitting: Internal Medicine

## 2023-07-08 ENCOUNTER — Telehealth: Payer: Self-pay

## 2023-07-08 ENCOUNTER — Encounter: Payer: Self-pay | Admitting: Internal Medicine

## 2023-07-08 ENCOUNTER — Other Ambulatory Visit: Payer: Self-pay | Admitting: Cardiology

## 2023-07-08 VITALS — BP 122/78 | HR 81 | Ht 71.0 in | Wt 315.6 lb

## 2023-07-08 DIAGNOSIS — E1159 Type 2 diabetes mellitus with other circulatory complications: Secondary | ICD-10-CM | POA: Diagnosis not present

## 2023-07-08 DIAGNOSIS — Z794 Long term (current) use of insulin: Secondary | ICD-10-CM

## 2023-07-08 DIAGNOSIS — Z6841 Body Mass Index (BMI) 40.0 and over, adult: Secondary | ICD-10-CM

## 2023-07-08 DIAGNOSIS — E119 Type 2 diabetes mellitus without complications: Secondary | ICD-10-CM

## 2023-07-08 DIAGNOSIS — E1165 Type 2 diabetes mellitus with hyperglycemia: Secondary | ICD-10-CM | POA: Diagnosis not present

## 2023-07-08 DIAGNOSIS — E782 Mixed hyperlipidemia: Secondary | ICD-10-CM | POA: Diagnosis not present

## 2023-07-08 LAB — HEMOGLOBIN A1C: Hemoglobin A1C: 7.7

## 2023-07-08 MED ORDER — TRESIBA FLEXTOUCH 200 UNIT/ML ~~LOC~~ SOPN
20.0000 [IU] | PEN_INJECTOR | Freq: Every day | SUBCUTANEOUS | 3 refills | Status: DC
Start: 2023-07-08 — End: 2023-11-11

## 2023-07-08 MED ORDER — TRESIBA FLEXTOUCH 200 UNIT/ML ~~LOC~~ SOPN: 20.0000 [IU] | PEN_INJECTOR | Freq: Every day | SUBCUTANEOUS | 3 refills | Status: DC

## 2023-07-08 NOTE — Telephone Encounter (Signed)
Billyjoe Go Ann Macari Zalesky, CMA  ?
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
## 2023-07-08 NOTE — Patient Instructions (Addendum)
Please use U500: - 80-90 units before b'fast - please try to take it 45-60 min before b'fast - 50-60 units before dinner - please try to take it 45-60 min before dinner  Try to add: - Tresiba 20 units daily and increase by 2-4 units every 2-4 days until sugars become controlled between meals  Please return in 3-4 months with your sugar log/CGM receiver.

## 2023-07-12 ENCOUNTER — Ambulatory Visit: Payer: BC Managed Care – PPO | Admitting: Orthopedic Surgery

## 2023-07-12 DIAGNOSIS — M1712 Unilateral primary osteoarthritis, left knee: Secondary | ICD-10-CM

## 2023-07-12 DIAGNOSIS — Z9889 Other specified postprocedural states: Secondary | ICD-10-CM

## 2023-07-12 NOTE — Patient Instructions (Signed)
ON Thursday/ Toniann Fail will make sure meds are here

## 2023-07-12 NOTE — Progress Notes (Signed)
Chief Complaint  Patient presents with   Knee Problem    Knee injection scheduled for second Orthovisc injection left knee    The first of 3 injections was performed on July 8  We do not have his medication in stock.  We will reschedule.  The next injection needs to be done before our on July 22.

## 2023-07-14 ENCOUNTER — Telehealth: Payer: Self-pay | Admitting: Orthopedic Surgery

## 2023-07-14 NOTE — Telephone Encounter (Signed)
Dr. Mort Sawyers pt - pt lvm to confirm his appointment tomorrow and he wants to confirm that we do have the Orthovisc before he comes.

## 2023-07-15 ENCOUNTER — Ambulatory Visit (INDEPENDENT_AMBULATORY_CARE_PROVIDER_SITE_OTHER): Payer: BC Managed Care – PPO | Admitting: Orthopedic Surgery

## 2023-07-15 ENCOUNTER — Encounter: Payer: Self-pay | Admitting: Orthopedic Surgery

## 2023-07-15 DIAGNOSIS — M1712 Unilateral primary osteoarthritis, left knee: Secondary | ICD-10-CM

## 2023-07-15 DIAGNOSIS — Z9889 Other specified postprocedural states: Secondary | ICD-10-CM

## 2023-07-15 NOTE — Progress Notes (Signed)
Chief Complaint  Patient presents with   Injections    Orthovisc 2 left knee     Encounter Diagnoses  Name Primary?   Primary osteoarthritis of left knee Yes   S/P left knee arthroscopy 04/28/23 medial meniscal repair     The patient has consented to and requested hyaluronic acid injection   MEDICATION: orthovisc  left  THE knee is prepped with alcohol and ethyl chloride  The injection is performed with a 21-gauge needle, via inferolateral approach  No complications were noted  Appropriate instructions post injection were given   1 week

## 2023-07-19 ENCOUNTER — Ambulatory Visit: Payer: BC Managed Care – PPO | Admitting: Orthopedic Surgery

## 2023-07-19 ENCOUNTER — Telehealth: Payer: Self-pay | Admitting: Pulmonary Disease

## 2023-07-19 ENCOUNTER — Telehealth: Payer: Self-pay | Admitting: Orthopedic Surgery

## 2023-07-19 ENCOUNTER — Telehealth: Payer: Self-pay

## 2023-07-19 ENCOUNTER — Other Ambulatory Visit: Payer: Self-pay | Admitting: Orthopedic Surgery

## 2023-07-19 ENCOUNTER — Other Ambulatory Visit: Payer: Self-pay | Admitting: Family Medicine

## 2023-07-19 ENCOUNTER — Telehealth: Payer: Self-pay | Admitting: Internal Medicine

## 2023-07-19 DIAGNOSIS — I10 Essential (primary) hypertension: Secondary | ICD-10-CM

## 2023-07-19 DIAGNOSIS — F900 Attention-deficit hyperactivity disorder, predominantly inattentive type: Secondary | ICD-10-CM

## 2023-07-19 DIAGNOSIS — E1142 Type 2 diabetes mellitus with diabetic polyneuropathy: Secondary | ICD-10-CM

## 2023-07-19 DIAGNOSIS — M1712 Unilateral primary osteoarthritis, left knee: Secondary | ICD-10-CM

## 2023-07-19 DIAGNOSIS — F5104 Psychophysiologic insomnia: Secondary | ICD-10-CM

## 2023-07-19 MED ORDER — MELOXICAM 7.5 MG PO TABS: 7.5000 mg | ORAL_TABLET | Freq: Every day | ORAL | 3 refills | Status: DC

## 2023-07-19 MED ORDER — ATOMOXETINE HCL 40 MG PO CAPS
40.0000 mg | ORAL_CAPSULE | Freq: Two times a day (BID) | ORAL | 3 refills | Status: DC
Start: 2023-07-19 — End: 2024-08-01

## 2023-07-19 MED ORDER — PREGABALIN 200 MG PO CAPS: ORAL_CAPSULE | ORAL | 1 refills | Status: DC

## 2023-07-19 MED ORDER — TRAZODONE HCL 100 MG PO TABS: 200.0000 mg | ORAL_TABLET | Freq: Every day | ORAL | 3 refills | Status: DC

## 2023-07-19 MED ORDER — OLMESARTAN MEDOXOMIL 40 MG PO TABS: 40.0000 mg | ORAL_TABLET | Freq: Every day | ORAL | 3 refills | Status: DC

## 2023-07-19 NOTE — Telephone Encounter (Signed)
Pt called and wanted to know can you get some of his medications transferred to a different pharmacy. Pt stated he is almost out of some of them also. Please call the pt

## 2023-07-19 NOTE — Progress Notes (Signed)
Requested Prescriptions   Signed Prescriptions Disp Refills   meloxicam (MOBIC) 7.5 MG tablet 90 tablet 3    Sig: Take 1 tablet (7.5 mg total) by mouth daily.

## 2023-07-19 NOTE — Telephone Encounter (Signed)
PT calling because he is changing Pharm and he has 3 meds that need to have the change auth by a Dr.  [facility] Pharm: [facility] [employer] [facility] (He is not sure of SP.) Please refill Clonazetam  (Please refill) Doxepin  Please call if you have any questions. [facility] not [facility] needs to be sent by Dr. Due to controlled substance.

## 2023-07-19 NOTE — Telephone Encounter (Signed)
Patient is switching to [facility].  Can you please and thank you send these medications in for him?  Thanks .Dm/cma

## 2023-07-19 NOTE — Telephone Encounter (Signed)
Dr. Mort Grimes pt - the pt lvm stating that he changed his pharmacy over the weekend and he wants a call back today to get all his medications moved over to the new pharmacy.  [phone]

## 2023-07-19 NOTE — Telephone Encounter (Signed)
Patient need a refill on his his Freestyle [place] 2 devise and Freestyle Libre 2 sensors called in to [facility] [phone]..please advise

## 2023-07-19 NOTE — Telephone Encounter (Signed)
Received a records request for last labs done here.  Called advised that we had done an A1C, PSA.  Then printed/faxed labs to [phone].  Dm/cma

## 2023-07-19 NOTE — Telephone Encounter (Signed)
I m not calling him for that   He can tell you the pharmacy

## 2023-07-20 ENCOUNTER — Other Ambulatory Visit: Payer: Self-pay | Admitting: Pulmonary Disease

## 2023-07-20 ENCOUNTER — Other Ambulatory Visit: Payer: Self-pay

## 2023-07-20 MED ORDER — FREESTYLE LIBRE 2 SENSOR MISC: 3 refills | Status: DC

## 2023-07-20 MED ORDER — FREESTYLE LIBRE 2 READER DEVI: 0 refills | Status: DC

## 2023-07-20 NOTE — Telephone Encounter (Signed)
Called and spoke with patient.  [facility]. [facility] stated [facility] not [facility] Cl[facility]epam and Doxepin because they are considered controlled medications.  Patient is requesting Doxepin and Clonazepam prescriptions be sent to [facility].  Doxepin 6mg  #30, 1 refill last prescription 05/27/23 Clonazepam 2mg  #30, 1 refill last  prescription 07/02/23  Message routed to Dr. Wynona Neat to advise

## 2023-07-20 NOTE — Telephone Encounter (Signed)
Patient notified VIA phone. Dm/cma  

## 2023-07-20 NOTE — Telephone Encounter (Signed)
1st no show, fee waived, letter sent to reschedule/notify of policy 

## 2023-07-21 ENCOUNTER — Telehealth: Payer: Self-pay | Admitting: Family Medicine

## 2023-07-21 DIAGNOSIS — E785 Hyperlipidemia, unspecified: Secondary | ICD-10-CM | POA: Diagnosis not present

## 2023-07-21 DIAGNOSIS — E119 Type 2 diabetes mellitus without complications: Secondary | ICD-10-CM | POA: Diagnosis not present

## 2023-07-21 DIAGNOSIS — I10 Essential (primary) hypertension: Secondary | ICD-10-CM | POA: Diagnosis not present

## 2023-07-21 DIAGNOSIS — E669 Obesity, unspecified: Secondary | ICD-10-CM | POA: Diagnosis not present

## 2023-07-21 NOTE — Telephone Encounter (Signed)
Pt stated he has been constipated for several days. He has tried a few OTC meds and they are not working. Please advise.

## 2023-07-21 NOTE — Telephone Encounter (Signed)
Please review and advise. Thanks. Dm/cma  

## 2023-07-22 ENCOUNTER — Encounter: Payer: Self-pay | Admitting: Orthopedic Surgery

## 2023-07-22 ENCOUNTER — Ambulatory Visit: Payer: BC Managed Care – PPO | Admitting: Orthopedic Surgery

## 2023-07-22 VITALS — Ht 71.0 in | Wt 315.0 lb

## 2023-07-22 DIAGNOSIS — M1712 Unilateral primary osteoarthritis, left knee: Secondary | ICD-10-CM

## 2023-07-22 DIAGNOSIS — M545 Low back pain, unspecified: Secondary | ICD-10-CM

## 2023-07-22 DIAGNOSIS — Z9889 Other specified postprocedural states: Secondary | ICD-10-CM

## 2023-07-22 MED ORDER — IBUPROFEN 800 MG PO TABS: 800.0000 mg | ORAL_TABLET | Freq: Three times a day (TID) | ORAL | 1 refills | Status: DC | PRN

## 2023-07-22 MED ORDER — METHOCARBAMOL 750 MG PO TABS
750.0000 mg | ORAL_TABLET | Freq: Four times a day (QID) | ORAL | 2 refills | Status: DC
Start: 2023-07-22 — End: 2024-03-10

## 2023-07-22 NOTE — Telephone Encounter (Signed)
Sent patient a my chart message with recommendations.  Also called VIA phone to advise. Dm/cma

## 2023-07-22 NOTE — Patient Instructions (Addendum)
We are referring you to [facility] from [facility] address is [address] [place] The phone number is [phone]  The office will call you with an appointment Dr. Christell Constant    While we are working on your approval for MRI please go ahead and call to schedule your appointment with [facility] within at least one (1) week.   Central Scheduling [phone]

## 2023-07-22 NOTE — Telephone Encounter (Signed)
Resending. This is the PT that has called before threatening suicide if he didn't get his medications.

## 2023-07-22 NOTE — Progress Notes (Signed)
Chief Complaint  Patient presents with   Injections    Left knee Orthovisc 3    Back Pain    States knee is improving but has flare up of low back pain    Encounter Diagnoses  Name Primary?   Primary osteoarthritis of left knee Yes   S/P left knee arthroscopy 04/28/23 medial meniscal repair    Chief Complaint  Patient presents with   Injections    Left knee Orthovisc 3    Back Pain    States knee is improving but has flare up of low back pain     Encounter Diagnoses  Name Primary?   Primary osteoarthritis of left knee Yes   S/P left knee arthroscopy 04/28/23 medial meniscal repair     The patient has consented to and requested hyaluronic acid injection   MEDICATION: ORTHOVISC  left  THE knee is prepped with alcohol and ethyl chloride  The injection is performed with a 21-gauge needle, via inferolateral approach  No complications were noted  Appropriate instructions post injection were given   in 8 week(s)   ADDENDUM:  Gary Grimes has ongoing chronic back pain going back 9 months ago.  He says he had an acute attack of back pain with some leg pain and mild weakness in his left lower extremity does not appear to be any cauda equina type symptoms  He had an x-ray about 9 months ago he has degenerative disc disease in his lumbar spine  He was on meloxicam for his knee but this was not helping his back pain  Recommend that he go for an MRI of his back to evaluate his neural elements and then that he get a referral to a spine specialist for definitive care as I am unable to manage that problem for him  Meds ordered this encounter  Medications   ibuprofen (ADVIL) 800 MG tablet    Sig: Take 1 tablet (800 mg total) by mouth every 8 (eight) hours as needed.    Dispense:  90 tablet    Refill:  1   methocarbamol (ROBAXIN) 750 MG tablet    Sig: Take 1 tablet (750 mg total) by mouth 4 (four) times daily.    Dispense:  60 tablet    Refill:  2

## 2023-07-23 ENCOUNTER — Encounter: Payer: BC Managed Care – PPO | Admitting: Orthopedic Surgery

## 2023-07-28 ENCOUNTER — Encounter (INDEPENDENT_AMBULATORY_CARE_PROVIDER_SITE_OTHER): Payer: Self-pay

## 2023-07-30 ENCOUNTER — Ambulatory Visit (HOSPITAL_BASED_OUTPATIENT_CLINIC_OR_DEPARTMENT_OTHER)
Admission: RE | Admit: 2023-07-30 | Discharge: 2023-07-30 | Disposition: A | Discharge status: Home / Self Care | Payer: BC Managed Care – PPO | Source: Ambulatory Visit | Attending: Orthopedic Surgery | Admitting: Orthopedic Surgery

## 2023-07-30 DIAGNOSIS — M5136 Other intervertebral disc degeneration, lumbar region: Secondary | ICD-10-CM | POA: Diagnosis not present

## 2023-07-30 DIAGNOSIS — M5126 Other intervertebral disc displacement, lumbar region: Secondary | ICD-10-CM | POA: Diagnosis not present

## 2023-07-30 DIAGNOSIS — M47816 Spondylosis without myelopathy or radiculopathy, lumbar region: Secondary | ICD-10-CM | POA: Diagnosis not present

## 2023-07-30 DIAGNOSIS — M48061 Spinal stenosis, lumbar region without neurogenic claudication: Secondary | ICD-10-CM | POA: Diagnosis not present

## 2023-07-30 DIAGNOSIS — M545 Low back pain, unspecified: Secondary | ICD-10-CM | POA: Insufficient documentation

## 2023-08-03 ENCOUNTER — Other Ambulatory Visit: Payer: Self-pay | Admitting: Pulmonary Disease

## 2023-08-03 MED ORDER — CLONAZEPAM 2 MG PO TABS: 2.0000 mg | ORAL_TABLET | Freq: Every day | ORAL | 1 refills | Status: DC

## 2023-08-03 MED ORDER — DOXEPIN HCL 6 MG PO TABS: 6.0000 mg | ORAL_TABLET | Freq: Every evening | ORAL | 1 refills | Status: DC

## 2023-08-03 NOTE — Telephone Encounter (Signed)
Called patient, he did not answer. Left message for patient to call back.  

## 2023-08-03 NOTE — Telephone Encounter (Signed)
Med refill sent to the preferred pharmacy, [facility]

## 2023-08-04 DIAGNOSIS — E669 Obesity, unspecified: Secondary | ICD-10-CM | POA: Diagnosis not present

## 2023-08-04 DIAGNOSIS — I10 Essential (primary) hypertension: Secondary | ICD-10-CM | POA: Diagnosis not present

## 2023-08-04 DIAGNOSIS — E119 Type 2 diabetes mellitus without complications: Secondary | ICD-10-CM | POA: Diagnosis not present

## 2023-08-04 DIAGNOSIS — E785 Hyperlipidemia, unspecified: Secondary | ICD-10-CM | POA: Diagnosis not present

## 2023-08-05 ENCOUNTER — Other Ambulatory Visit (HOSPITAL_COMMUNITY): Payer: Self-pay

## 2023-08-05 ENCOUNTER — Telehealth: Payer: Self-pay

## 2023-08-05 NOTE — Telephone Encounter (Signed)
*  [facility]  Pharmacy Patient Advocate Encounter   Received notification from Fax that prior authorization for Doxepin HCl 6MG  tablets  is required/requested.   Insurance verification completed.   The patient is insured through  [employer]  .   Per test claim: PA required; PA submitted to [facility] via CoverMyMeds Key/confirmation #/EOC [id number] Status is pending

## 2023-08-06 ENCOUNTER — Ambulatory Visit: Payer: BC Managed Care – PPO | Admitting: Pulmonary Disease

## 2023-08-06 NOTE — Telephone Encounter (Signed)
Pharmacy Patient Advocate Encounter  Received notification from  [facility]  that Prior Authorization for Doxepin HCl 6MG  tablets  has been APPROVED from 08/05/2023 to 08/02/2024

## 2023-08-18 DIAGNOSIS — I10 Essential (primary) hypertension: Secondary | ICD-10-CM | POA: Diagnosis not present

## 2023-08-18 DIAGNOSIS — E785 Hyperlipidemia, unspecified: Secondary | ICD-10-CM | POA: Diagnosis not present

## 2023-08-18 DIAGNOSIS — E669 Obesity, unspecified: Secondary | ICD-10-CM | POA: Diagnosis not present

## 2023-08-18 DIAGNOSIS — R5382 Chronic fatigue, unspecified: Secondary | ICD-10-CM | POA: Diagnosis not present

## 2023-08-19 ENCOUNTER — Ambulatory Visit: Payer: BC Managed Care – PPO | Admitting: Family Medicine

## 2023-08-20 ENCOUNTER — Other Ambulatory Visit: Payer: Self-pay

## 2023-08-20 ENCOUNTER — Other Ambulatory Visit (INDEPENDENT_AMBULATORY_CARE_PROVIDER_SITE_OTHER): Payer: BC Managed Care – PPO

## 2023-08-20 ENCOUNTER — Ambulatory Visit: Payer: BC Managed Care – PPO | Admitting: Orthopedic Surgery

## 2023-08-20 DIAGNOSIS — M549 Dorsalgia, unspecified: Secondary | ICD-10-CM

## 2023-08-20 DIAGNOSIS — M545 Low back pain, unspecified: Secondary | ICD-10-CM

## 2023-08-20 DIAGNOSIS — M5416 Radiculopathy, lumbar region: Secondary | ICD-10-CM

## 2023-08-20 MED ORDER — TRAMADOL HCL 50 MG PO TABS: 50.0000 mg | ORAL_TABLET | Freq: Four times a day (QID) | ORAL | 0 refills | Status: AC | PRN

## 2023-08-20 NOTE — Progress Notes (Signed)
Orthopedic Spine Surgery Office Note  Assessment: Patient is a 62 y.o. male with low back pain that radiates into the left lower extremity along the lateral aspect of the thigh and leg   Plan: -Explained that initially conservative treatment is tried as a significant number of patients may experience relief with these treatment modalities. Discussed that the conservative treatments include:  -activity modification  -physical therapy  -over the counter pain medications  -medrol dosepak  -lumbar steroid injections -Patient has tried PT, oral steroids, intramuscular steroid injection, Lyrica -Recommended diagnostic/therapeutic lumbar steroid injection -Prescribed tramadol for additional pain relief -Discussed weight loss to help with his overall health and back pain -Patient should return to office in 6 weeks, x-rays at next visit: None   Patient expressed understanding of the plan and all questions were answered to the patient's satisfaction.   ___________________________________________________________________________   History:  Patient is a 62 y.o. male who presents today for lumbar spine.  Patient has had 2 years of back pain that comes and goes.  He has noted acute exacerbation of his pain.  He states that the pain is felt in his low back rating into the left lower extremity.  He feels it along the lateral aspect of the left hip and thigh into the lateral aspect of the leg.  He has no symptoms on the right lower extremity.  There was no trauma or injury that preceded the onset of pain.  Pain is severe in nature.  He rates it as a 7 out of 10.  He says it is worse at the end of the day.   Weakness: Denies Symptoms of imbalance: Denies Paresthesias and numbness: Yes, gets paresthesias and numbness in the left lateral thigh and leg Saddle anesthesia: Denies  Treatments tried: PT, oral steroids, intramuscular steroids, Lyrica  Review of systems: Denies fevers and chills, night  sweats, unexplained weight loss, history of cancer.  Has had pain that wakes him at night  Past medical history: Angina pectoris Chronic low back pain ADHD Bipolar disorder CAD DM Insomnia GERD Peripheral neuropathy HLD OSA  Allergies: morphine, semaglutide  Past surgical history:  Polypectomy Left foot plantar fascia release Left shoulder arthroscopy Anal fissure repair Appendectomy Atrial fibrillation ablation Chondroplasty Hernia repair Nasal septoplasty with turbinoplasty Left gastrocnemius recession Left knee arthroscopy  Social history: Denies use of nicotine product (smoking, vaping, patches, smokeless) Alcohol use: Denies Denies recreational drug use   Physical Exam:  BMI of 43.9  General: no acute distress, appears stated age Neurologic: alert, answering questions appropriately, following commands Respiratory: unlabored breathing on room air, symmetric chest rise Psychiatric: appropriate affect, normal cadence to speech   MSK (spine):  -Strength exam      Left  Right EHL    5/5  5/5 TA    5/5  5/5 GSC    5/5  5/5 Knee extension  5/5  5/5 Hip flexion   5/5  5/5  -Sensory exam    Sensation intact to light touch in L3-S1 nerve distributions of bilateral lower extremities  -Achilles DTR: 1/4 on the left, 1/4 on the right -Patellar tendon DTR: 1/4 on the left, 1/4 on the right  -Straight leg raise: Negative bilaterally -Femoral nerve stretch test: Negative bilaterally -Clonus: no beats bilaterally  -Left hip exam: No pain through range of motion, negative Stinchfield, negative FABER -Right hip exam: No pain through range of motion, negative Stinchfield, negative FABER  Imaging: XR of the lumbar spine from 08/20/2023 was independently reviewed and interpreted,  showing disc height loss at L5/S1. No other significant degenerative changes. No fracture or dislocation seen. No evidence of instability on flexion/extension views.   MRI of the  lumbar spine from 07/30/2023 was independently reviewed and interpreted, showing disc desiccation at L2/3 and L3/4 and facet hypertrophy at L4/5. No other significant degenerative changes.  Mild foraminal stenosis on the left at L3/4 and L4/5.  Mild lateral recess stenosis bilaterally L4/5.  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   Patient name: Gary Grimes Patient MRN: [id number] Date of visit: 08/20/23

## 2023-08-24 ENCOUNTER — Ambulatory Visit: Payer: BC Managed Care – PPO

## 2023-08-26 ENCOUNTER — Other Ambulatory Visit: Payer: Self-pay

## 2023-08-26 DIAGNOSIS — I499 Cardiac arrhythmia, unspecified: Secondary | ICD-10-CM | POA: Insufficient documentation

## 2023-08-27 ENCOUNTER — Ambulatory Visit: Payer: BC Managed Care – PPO | Attending: Cardiology | Admitting: Cardiology

## 2023-08-27 ENCOUNTER — Telehealth: Payer: Self-pay | Admitting: Cardiology

## 2023-08-27 ENCOUNTER — Encounter: Payer: Self-pay | Admitting: Cardiology

## 2023-08-27 VITALS — BP 117/72 | HR 64 | Ht 71.0 in | Wt 310.0 lb

## 2023-08-27 DIAGNOSIS — G4733 Obstructive sleep apnea (adult) (pediatric): Secondary | ICD-10-CM

## 2023-08-27 DIAGNOSIS — Z9889 Other specified postprocedural states: Secondary | ICD-10-CM | POA: Diagnosis not present

## 2023-08-27 DIAGNOSIS — Z8679 Personal history of other diseases of the circulatory system: Secondary | ICD-10-CM

## 2023-08-27 DIAGNOSIS — Z1321 Encounter for screening for nutritional disorder: Secondary | ICD-10-CM

## 2023-08-27 DIAGNOSIS — I2510 Atherosclerotic heart disease of native coronary artery without angina pectoris: Secondary | ICD-10-CM

## 2023-08-27 DIAGNOSIS — I480 Paroxysmal atrial fibrillation: Secondary | ICD-10-CM

## 2023-08-27 DIAGNOSIS — I10 Essential (primary) hypertension: Secondary | ICD-10-CM

## 2023-08-27 DIAGNOSIS — E1140 Type 2 diabetes mellitus with diabetic neuropathy, unspecified: Secondary | ICD-10-CM

## 2023-08-27 DIAGNOSIS — Z794 Long term (current) use of insulin: Secondary | ICD-10-CM

## 2023-08-27 NOTE — Telephone Encounter (Signed)
Good afternoon, I saw this gentleman today in follow-up and I realize he is not on anticoagulation.  He keeps mentioning to me that he will make an appointment with you.  He has not done this update.  He has undergone atrial fibrillation ablation he tells me with you.  Please advise whether he needs to be on anticoagulation.  He has no symptoms at this time and subsequent EKGs have not revealed atrial fibrillation.  Thank you so much have a wonderful long weekend
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
## 2023-08-27 NOTE — Patient Instructions (Signed)
Medication Instructions:  Your physician recommends that you continue on your current medications as directed. Please refer to the Current Medication list given to you today.  *If you need a refill on your cardiac medications before your next appointment, please call your pharmacy*   Lab Work: Your physician recommends that you return for lab work in: the next few days for CMP, CBC, TSH, A1C, vitamin D and lipids. You need to have labs done when you are fasting. [facility] lab is located on the 3rd floor, [address]. Hours are Monday - Friday 8 am to 4 pm, closed 11:30 am to 1:00 pm. You do NOT need an appointment.   If you have labs (blood work) drawn today and your tests are completely normal, you will receive your results only by: MyChart Message (if you have MyChart) OR A paper copy in the mail If you have any lab test that is abnormal or we need to change your treatment, we will call you to review the results.   Testing/Procedures: None ordered   Follow-Up: At [facility], you and your health needs are our priority.  As part of our continuing mission to provide you with exceptional heart care, we have created designated Provider Care Teams.  These Care Teams include your primary Cardiologist (physician) and Advanced Practice Providers (APPs -  Physician Assistants and Nurse Practitioners) who all work together to provide you with the care you need, when you need it.  We recommend signing up for the patient portal called "MyChart".  Sign up information is provided on this After Visit Summary.  MyChart is used to connect with patients for Virtual Visits (Telemedicine).  Patients are able to view lab/test results, encounter notes, upcoming appointments, etc.  Non-urgent messages can be sent to your provider as well.   To learn more about what you can do with MyChart, go to [email or web address].    Your next appointment:   9 month(s)  The format for your next appointment:    In Person  Provider:   Belva Crome, MD    Other Instructions none  Important Information About Sugar

## 2023-08-31 ENCOUNTER — Ambulatory Visit (INDEPENDENT_AMBULATORY_CARE_PROVIDER_SITE_OTHER): Payer: BC Managed Care – PPO | Admitting: Orthopedic Surgery

## 2023-08-31 ENCOUNTER — Other Ambulatory Visit (INDEPENDENT_AMBULATORY_CARE_PROVIDER_SITE_OTHER): Payer: BC Managed Care – PPO

## 2023-08-31 ENCOUNTER — Ambulatory Visit: Payer: BC Managed Care – PPO | Attending: Cardiology

## 2023-08-31 ENCOUNTER — Telehealth: Payer: Self-pay | Admitting: Orthopedic Surgery

## 2023-08-31 DIAGNOSIS — I480 Paroxysmal atrial fibrillation: Secondary | ICD-10-CM

## 2023-08-31 DIAGNOSIS — Z9889 Other specified postprocedural states: Secondary | ICD-10-CM

## 2023-08-31 DIAGNOSIS — M1812 Unilateral primary osteoarthritis of first carpometacarpal joint, left hand: Secondary | ICD-10-CM

## 2023-08-31 DIAGNOSIS — M79642 Pain in left hand: Secondary | ICD-10-CM

## 2023-08-31 MED ORDER — BETAMETHASONE SOD PHOS & ACET 6 (3-3) MG/ML IJ SUSP
6.0000 mg | INTRAMUSCULAR | Status: AC | PRN
Start: 2023-08-31 — End: 2023-08-31
  Administered 2023-08-31: 6 mg via INTRA_ARTICULAR

## 2023-08-31 MED ORDER — LIDOCAINE HCL 1 % IJ SOLN
1.0000 mL | INTRAMUSCULAR | Status: AC | PRN
Start: 2023-08-31 — End: 2023-08-31
  Administered 2023-08-31: 1 mL

## 2023-08-31 NOTE — Telephone Encounter (Signed)
Dr. Mort Sawyers pt - pt lvm stating that his knee is flaring up again, instability, lots of pain, having to wear the back brace and almost to the point of using the cane.  He wanted this message to get to Dr. Romeo Apple.  [phone]
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
## 2023-08-31 NOTE — Telephone Encounter (Signed)
Recommendations reviewed with pt as per Dr. Revankar's note.  Pt verbalized understanding and had no additional questions.   

## 2023-08-31 NOTE — Addendum Note (Signed)
Addended by: Eleonore Chiquito on: 08/31/2023 09:11 AM   Modules accepted: Orders

## 2023-09-01 ENCOUNTER — Other Ambulatory Visit: Payer: Self-pay | Admitting: Orthopedic Surgery

## 2023-09-01 ENCOUNTER — Encounter: Payer: Self-pay | Admitting: Orthopedic Surgery

## 2023-09-01 DIAGNOSIS — R5382 Chronic fatigue, unspecified: Secondary | ICD-10-CM | POA: Diagnosis not present

## 2023-09-01 DIAGNOSIS — I10 Essential (primary) hypertension: Secondary | ICD-10-CM | POA: Diagnosis not present

## 2023-09-01 DIAGNOSIS — E669 Obesity, unspecified: Secondary | ICD-10-CM | POA: Diagnosis not present

## 2023-09-01 DIAGNOSIS — E119 Type 2 diabetes mellitus without complications: Secondary | ICD-10-CM | POA: Diagnosis not present

## 2023-09-01 DIAGNOSIS — M25562 Pain in left knee: Secondary | ICD-10-CM

## 2023-09-01 MED ORDER — PREDNISONE 10 MG PO TABS
ORAL_TABLET | ORAL | 0 refills | Status: DC
Start: 2023-09-01 — End: 2023-09-15

## 2023-09-01 MED ORDER — HYDROCODONE-ACETAMINOPHEN 10-325 MG PO TABS: 1.0000 | ORAL_TABLET | ORAL | 0 refills | Status: AC | PRN

## 2023-09-01 NOTE — Progress Notes (Signed)
[place] knee pain  Requested Prescriptions   Signed Prescriptions Disp Refills   predniSONE (DELTASONE) 10 MG tablet 60 tablet 0    Sig: Take 40 mg today and then 10 mg 3 times a day for 7 days   HYDROcodone-acetaminophen (NORCO) 10-325 MG tablet 30 tablet 0    Sig: Take 1 tablet by mouth every 4 (four) hours as needed for up to 5 days.

## 2023-09-02 ENCOUNTER — Telehealth: Payer: Self-pay | Admitting: Cardiology

## 2023-09-02 ENCOUNTER — Telehealth: Payer: Self-pay | Admitting: Orthopedic Surgery

## 2023-09-02 NOTE — Telephone Encounter (Signed)
Patient called, lvm stating he called on Tuesday in severe pain and that he hasn't heard anything.  I called him back, spoke w/him and advised that Dr. Romeo Apple responded to him in [employer] on 9/04.  He stated he has been juggling keeping his 64 year old grandson full time and work and that he's bad about using mychart.  Advised him that he needed to make time to pull it up and read what Dr. Romeo Apple sent him, he verbalized understanding.

## 2023-09-02 NOTE — Telephone Encounter (Signed)
Pt states he does not want the heart monitor and if he changes his mind he will give [name] a callback. Please advise

## 2023-09-08 ENCOUNTER — Ambulatory Visit: Payer: BC Managed Care – PPO | Admitting: Physical Medicine and Rehabilitation

## 2023-09-08 ENCOUNTER — Other Ambulatory Visit: Payer: Self-pay

## 2023-09-08 VITALS — BP 137/83 | HR 69

## 2023-09-08 DIAGNOSIS — M5416 Radiculopathy, lumbar region: Secondary | ICD-10-CM | POA: Diagnosis not present

## 2023-09-08 MED ORDER — METHYLPREDNISOLONE ACETATE 80 MG/ML IJ SUSP
80.0000 mg | Freq: Once | INTRAMUSCULAR | Status: AC
  Administered 2023-09-08: 80 mg

## 2023-09-08 NOTE — Patient Instructions (Signed)

## 2023-09-08 NOTE — Progress Notes (Signed)
Functional Pain Scale - descriptive words and definitions  Unmanageable (7)  Pain interferes with normal ADL's/nothing seems to help/sleep is very difficult/active distractions are very difficult to concentrate on. Severe range order  Average Pain 9-10   +Driver, -BT, -Dye Allergies.  Lower back pain in the middle that radiates down the legs to the feet

## 2023-09-13 NOTE — Progress Notes (Signed)
Gary Grimes - 62 y.o. male MRN [id number]  Date of birth: 22-Jul-1961  Office Visit Note: Visit Date: 09/08/2023 PCP: Loyola Mast, MD Referred by: London Sheer, MD  Subjective: Chief Complaint  Patient presents with   Lower Back - Pain   HPI:  Gary Grimes is a 62 y.o. male who comes in today at the request of Dr. Willia Craze for planned Left L4-5 Lumbar Transforaminal epidural steroid injection with fluoroscopic guidance.  The patient has failed conservative care including home exercise, medications, time and activity modification.  This injection will be diagnostic and hopefully therapeutic.  Please see requesting physician notes for further details and justification.   ROS Otherwise per HPI.  Assessment & Plan: Visit Diagnoses:    ICD-10-CM   1. Lumbar radiculopathy  M54.16 XR C-ARM NO REPORT    Epidural Steroid injection    methylPREDNISolone acetate (DEPO-MEDROL) injection 80 mg      Plan: No additional findings.   Meds & Orders:  Meds ordered this encounter  Medications   methylPREDNISolone acetate (DEPO-MEDROL) injection 80 mg    Orders Placed This Encounter  Procedures   XR C-ARM NO REPORT   Epidural Steroid injection    Follow-up: Return for visit to requesting provider as needed.   Procedures: No procedures performed  Lumbosacral Transforaminal Epidural Steroid Injection - Sub-Pedicular Approach with Fluoroscopic Guidance  Patient: Gary Grimes      Date of Birth: 11-23-1961 MRN: [id number] PCP: Loyola Mast, MD      Visit Date: 09/08/2023   Universal Protocol:    Date/Time: 09/08/2023  Consent Given By: the patient  Position: PRONE  Additional Comments: Vital signs were monitored before and after the procedure. Patient was prepped and draped in the usual sterile fashion. The correct patient, procedure, and site was verified.   Injection Procedure Details:   Procedure diagnoses: Lumbar radiculopathy [M54.16]    Meds  Administered:  Meds ordered this encounter  Medications   methylPREDNISolone acetate (DEPO-MEDROL) injection 80 mg    Laterality: Left  Location/Site: L4  Needle:5.0 in., 22 ga.  Short bevel or Quincke spinal needle  Needle Placement: Transforaminal  Findings:    -Comments: Excellent flow of contrast along the nerve, nerve root and into the epidural space.  Procedure Details: After squaring off the end-plates to get a true AP view, the C-arm was positioned so that an oblique view of the foramen as noted above was visualized. The target area is just inferior to the "nose of the scotty dog" or sub pedicular. The soft tissues overlying this structure were infiltrated with 2-3 ml. of 1% Lidocaine without Epinephrine.  The spinal needle was inserted toward the target using a "trajectory" view along the fluoroscope beam.  Under AP and lateral visualization, the needle was advanced so it did not puncture dura and was located close the 6 O'Clock position of the pedical in AP tracterory. Biplanar projections were used to confirm position. Aspiration was confirmed to be negative for CSF and/or blood. A 1-2 ml. volume of Isovue-250 was injected and flow of contrast was noted at each level. Radiographs were obtained for documentation purposes.   After attaining the desired flow of contrast documented above, a 0.5 to 1.0 ml test dose of 0.25% Marcaine was injected into each respective transforaminal space.  The patient was observed for 90 seconds post injection.  After no sensory deficits were reported, and normal lower extremity motor function was noted,   the above injectate  was administered so that equal amounts of the injectate were placed at each foramen (level) into the transforaminal epidural space.   Additional Comments:  No complications occurred Dressing: 2 x 2 sterile gauze and Band-Aid    Post-procedure details: Patient was observed during the procedure. Post-procedure instructions  were reviewed.  Patient left the clinic in stable condition.    Clinical History: MRI LUMBAR SPINE WITHOUT CONTRAST   TECHNIQUE: Multiplanar, multisequence MR imaging of the lumbar spine was performed. No intravenous contrast was administered.   COMPARISON:  CT Abdomen and Pelvis 03/10/2021.   FINDINGS: Segmentation: Normal on the comparison. Left side L5-S1 assimilation joint, normal variant.   Alignment: Stable lumbar lordosis since 2022. Subtle retrolisthesis of L3 on L4.   Vertebrae: Background T1 bone marrow signal is mildly heterogeneous but within normal limits. No marrow edema or evidence of acute osseous abnormality. Intact visible sacrum and SI joints.   Conus medullaris and cauda equina: Conus extends to the T12-L1 level. No lower spinal cord or conus signal abnormality. Unremarkable cauda equina nerve roots.   Paraspinal and other soft tissues: Negative; benign appearing right renal cyst redemonstrated (no follow-up imaging recommended). Negative visualized posterior paraspinal soft tissues.   Disc levels:   There is a degree of congenital spinal canal narrowing due to short pedicle distance (such as at the L2 level on series 5, image 14). The following superimposed degenerative changes are noted:   T11-T12: Subtle disc bulging and posterior element hypertrophy no significant stenosis.   T12-L1:  Negative.   L1-L2:  Negative.   L2-L3: Mild circumferential disc bulge eccentric to the left. Mild facet hypertrophy. Trace degenerative facet joint fluid. No significant stenosis.   L3-L4: Circumferential disc bulge. Mild ligament flavum and mild to moderate facet degenerative facet joint fluid (series 5 image 25). No significant spinal stenosis. Mild to moderate bilateral L3 foraminal stenosis appears greater on the left.   L4-L5: Mild circumferential disc bulge. Mild to moderate facet hypertrophy. No significant spinal stenosis. Moderate left and  mild right L4 neural foraminal stenosis.   L5-S1:  Mild facet hypertrophy.  No stenosis.   IMPRESSION: Mild congenital spinal canal narrowing. Superimposed lumbar disc bulging and facet degeneration. Overall no significant spinal stenosis, but up to moderate left neural foraminal stenosis at L3-L4 and L4-L5. Query Left L3 and/or L4 nerve levels.     Electronically Signed   By: Odessa Fleming M.D.   On: 08/09/2023 12:03     Objective:  VS:  HT:    WT:   BMI:     BP:137/83  HR:69bpm  TEMP: ( )  RESP:  Physical Exam Vitals and nursing note reviewed.  Constitutional:      General: He is not in acute distress.    Appearance: Normal appearance. He is not ill-appearing.  HENT:     Head: Normocephalic and atraumatic.     Right Ear: External ear normal.     Left Ear: External ear normal.     Nose: No congestion.  Eyes:     Extraocular Movements: Extraocular movements intact.  Cardiovascular:     Rate and Rhythm: Normal rate.     Pulses: Normal pulses.  Pulmonary:     Effort: Pulmonary effort is normal. No respiratory distress.  Abdominal:     General: There is no distension.     Palpations: Abdomen is soft.  Musculoskeletal:        General: No tenderness or signs of injury.     Cervical back:  Neck supple.     Right lower leg: No edema.     Left lower leg: No edema.     Comments: Patient has good distal strength without clonus.  Skin:    Findings: No erythema or rash.  Neurological:     General: No focal deficit present.     Mental Status: He is alert and oriented to person, place, and time.     Sensory: No sensory deficit.     Motor: No weakness or abnormal muscle tone.     Coordination: Coordination normal.  Psychiatric:        Mood and Affect: Mood normal.        Behavior: Behavior normal.      Imaging: No results found.

## 2023-09-13 NOTE — Procedures (Signed)
Lumbosacral Transforaminal Epidural Steroid Injection - Sub-Pedicular Approach with Fluoroscopic Guidance  Patient: Gary Grimes      Date of Birth: 02-03-1961 MRN: [id number] PCP: Loyola Mast, MD      Visit Date: 09/08/2023   Universal Protocol:    Date/Time: 09/08/2023  Consent Given By: the patient  Position: PRONE  Additional Comments: Vital signs were monitored before and after the procedure. Patient was prepped and draped in the usual sterile fashion. The correct patient, procedure, and site was verified.   Injection Procedure Details:   Procedure diagnoses: Lumbar radiculopathy [M54.16]    Meds Administered:  Meds ordered this encounter  Medications   methylPREDNISolone acetate (DEPO-MEDROL) injection 80 mg    Laterality: Left  Location/Site: L4  Needle:5.0 in., 22 ga.  Short bevel or Quincke spinal needle  Needle Placement: Transforaminal  Findings:    -Comments: Excellent flow of contrast along the nerve, nerve root and into the epidural space.  Procedure Details: After squaring off the end-plates to get a true AP view, the C-arm was positioned so that an oblique view of the foramen as noted above was visualized. The target area is just inferior to the "nose of the scotty dog" or sub pedicular. The soft tissues overlying this structure were infiltrated with 2-3 ml. of 1% Lidocaine without Epinephrine.  The spinal needle was inserted toward the target using a "trajectory" view along the fluoroscope beam.  Under AP and lateral visualization, the needle was advanced so it did not puncture dura and was located close the 6 O'Clock position of the pedical in AP tracterory. Biplanar projections were used to confirm position. Aspiration was confirmed to be negative for CSF and/or blood. A 1-2 ml. volume of Isovue-250 was injected and flow of contrast was noted at each level. Radiographs were obtained for documentation purposes.   After attaining the desired flow  of contrast documented above, a 0.5 to 1.0 ml test dose of 0.25% Marcaine was injected into each respective transforaminal space.  The patient was observed for 90 seconds post injection.  After no sensory deficits were reported, and normal lower extremity motor function was noted,   the above injectate was administered so that equal amounts of the injectate were placed at each foramen (level) into the transforaminal epidural space.   Additional Comments:  No complications occurred Dressing: 2 x 2 sterile gauze and Band-Aid    Post-procedure details: Patient was observed during the procedure. Post-procedure instructions were reviewed.  Patient left the clinic in stable condition.

## 2023-09-15 ENCOUNTER — Encounter: Payer: Self-pay | Admitting: Family Medicine

## 2023-09-15 ENCOUNTER — Ambulatory Visit: Payer: BC Managed Care – PPO | Admitting: Family Medicine

## 2023-09-15 VITALS — BP 132/74 | HR 80 | Temp 98.2°F | Ht 71.0 in | Wt 304.4 lb

## 2023-09-15 DIAGNOSIS — G4733 Obstructive sleep apnea (adult) (pediatric): Secondary | ICD-10-CM | POA: Diagnosis not present

## 2023-09-15 DIAGNOSIS — E119 Type 2 diabetes mellitus without complications: Secondary | ICD-10-CM | POA: Diagnosis not present

## 2023-09-15 DIAGNOSIS — E785 Hyperlipidemia, unspecified: Secondary | ICD-10-CM | POA: Diagnosis not present

## 2023-09-15 DIAGNOSIS — E669 Obesity, unspecified: Secondary | ICD-10-CM | POA: Diagnosis not present

## 2023-09-15 DIAGNOSIS — J40 Bronchitis, not specified as acute or chronic: Secondary | ICD-10-CM

## 2023-09-15 LAB — POC COVID19 BINAXNOW: SARS Coronavirus 2 Ag: NEGATIVE

## 2023-09-15 MED ORDER — METHYLPREDNISOLONE 4 MG PO TBPK: ORAL_TABLET | ORAL | 0 refills | Status: DC

## 2023-09-15 MED ORDER — ALBUTEROL SULFATE (2.5 MG/3ML) 0.083% IN NEBU
2.5000 mg | INHALATION_SOLUTION | Freq: Four times a day (QID) | RESPIRATORY_TRACT | 1 refills | Status: DC | PRN
Start: 2023-09-15 — End: 2023-09-20

## 2023-09-15 MED ORDER — DOXYCYCLINE HYCLATE 100 MG PO TABS
100.0000 mg | ORAL_TABLET | Freq: Two times a day (BID) | ORAL | 0 refills | Status: DC
Start: 2023-09-15 — End: 2024-01-04

## 2023-09-15 MED ORDER — PROMETHAZINE-CODEINE 6.25-10 MG/5ML PO SYRP
10.0000 mL | ORAL_SOLUTION | Freq: Four times a day (QID) | ORAL | 0 refills | Status: DC | PRN
Start: 2023-09-15 — End: 2023-09-22

## 2023-09-15 NOTE — Assessment & Plan Note (Signed)
In my experience with this patient, he is at high risk for hospitalization with his respiratory complaints. I will go ahead and treat him with a course of doxycycline, a Medrol dosepak, and have him use his nebulizer. I will provide some cough syrup as well. I will plan to reassess him in 1 week.
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
## 2023-09-16 ENCOUNTER — Telehealth: Payer: Self-pay | Admitting: Physical Medicine and Rehabilitation

## 2023-09-16 ENCOUNTER — Ambulatory Visit: Payer: BC Managed Care – PPO | Admitting: Orthopedic Surgery

## 2023-09-16 ENCOUNTER — Encounter: Payer: Self-pay | Admitting: Orthopedic Surgery

## 2023-09-16 ENCOUNTER — Telehealth: Payer: Self-pay

## 2023-09-16 DIAGNOSIS — Z9889 Other specified postprocedural states: Secondary | ICD-10-CM | POA: Diagnosis not present

## 2023-09-16 NOTE — Telephone Encounter (Signed)
Patient called and said Aundra Millet called him and he returning the call. CB#[phone]

## 2023-09-16 NOTE — Progress Notes (Signed)
Routine follow-up  Encounter Diagnosis  Name Primary?   S/P left knee arthroscopy 04/28/23 medial meniscal repair Yes    Chief Complaint  Patient presents with   Knee Pain    Left knee scope 04/28/23 improving with knee pain but feels like has trouble with gait.     And he is really doing well but every now and then he feels his knee Shake for tremble a little bit  He is getting treatment on his lower back He also has some issues with his left thumb with OA  I advised him that he will have some aches and pains in his left knee with intermittent swelling because of the arthritis  He would like to get hyaluronic acid shots repeated his last 1 was in July  We will see him in December barring any problems and we can schedule the hyaluronic acid for January 2025

## 2023-09-16 NOTE — Telephone Encounter (Signed)
Patient called and LM stating he is in severe pain. Prior ESI done about 1 month ago helped for about week. States now he has pain with bending and ADL's.  Patient stated Dr Alvester Morin mentioned "burning procedure" to him and would like to discuss this. [phone]

## 2023-09-17 ENCOUNTER — Other Ambulatory Visit: Payer: Self-pay

## 2023-09-17 ENCOUNTER — Emergency Department (HOSPITAL_BASED_OUTPATIENT_CLINIC_OR_DEPARTMENT_OTHER): Payer: BC Managed Care – PPO

## 2023-09-17 ENCOUNTER — Emergency Department (HOSPITAL_BASED_OUTPATIENT_CLINIC_OR_DEPARTMENT_OTHER)
Admission: EM | Admit: 2023-09-17 | Discharge: 2023-09-18 | Disposition: A | Discharge status: Home / Self Care | Payer: BC Managed Care – PPO | Attending: Emergency Medicine | Admitting: Emergency Medicine

## 2023-09-17 ENCOUNTER — Encounter (HOSPITAL_BASED_OUTPATIENT_CLINIC_OR_DEPARTMENT_OTHER): Payer: Self-pay | Admitting: Emergency Medicine

## 2023-09-17 DIAGNOSIS — Z7982 Long term (current) use of aspirin: Secondary | ICD-10-CM | POA: Insufficient documentation

## 2023-09-17 DIAGNOSIS — E1165 Type 2 diabetes mellitus with hyperglycemia: Secondary | ICD-10-CM | POA: Insufficient documentation

## 2023-09-17 DIAGNOSIS — Z794 Long term (current) use of insulin: Secondary | ICD-10-CM | POA: Diagnosis not present

## 2023-09-17 DIAGNOSIS — R0602 Shortness of breath: Secondary | ICD-10-CM | POA: Diagnosis not present

## 2023-09-17 DIAGNOSIS — Z1152 Encounter for screening for COVID-19: Secondary | ICD-10-CM | POA: Insufficient documentation

## 2023-09-17 DIAGNOSIS — R739 Hyperglycemia, unspecified: Secondary | ICD-10-CM

## 2023-09-17 DIAGNOSIS — I2510 Atherosclerotic heart disease of native coronary artery without angina pectoris: Secondary | ICD-10-CM | POA: Diagnosis not present

## 2023-09-17 DIAGNOSIS — R0789 Other chest pain: Secondary | ICD-10-CM | POA: Diagnosis not present

## 2023-09-17 LAB — URINALYSIS, ROUTINE W REFLEX MICROSCOPIC
Bilirubin Urine: NEGATIVE
Glucose, UA: >=500 mg/dL — AB
Hgb urine dipstick: NEGATIVE
Ketones, ur: NEGATIVE mg/dL
Leukocytes,Ua: NEGATIVE
Nitrite: NEGATIVE
Protein, ur: NEGATIVE mg/dL
Specific Gravity, Urine: 1.02 (ref 1.005–1.030)
pH: 6.5 (ref 5.0–8.0)

## 2023-09-17 LAB — CBC
HCT: 40.3 % (ref 39.0–52.0)
Hemoglobin: 13.9 g/dL (ref 13.0–17.0)
MCH: 31.3 pg (ref 26.0–34.0)
MCHC: 34.5 g/dL (ref 30.0–36.0)
MCV: 90.8 fL (ref 80.0–100.0)
Platelets: 252 10*3/uL (ref 150–400)
RBC: 4.44 MIL/uL (ref 4.22–5.81)
RDW: 13.3 % (ref 11.5–15.5)
WBC: 11.9 10*3/uL — ABNORMAL HIGH (ref 4.0–10.5)
nRBC: 0 % (ref 0.0–0.2)

## 2023-09-17 LAB — BASIC METABOLIC PANEL
Anion gap: 11 (ref 5–15)
BUN: 38 mg/dL — ABNORMAL HIGH (ref 8–23)
CO2: 28 mmol/L (ref 22–32)
Calcium: 9.4 mg/dL (ref 8.9–10.3)
Chloride: 97 mmol/L — ABNORMAL LOW (ref 98–111)
Creatinine, Ser: 1.28 mg/dL — ABNORMAL HIGH (ref 0.61–1.24)
GFR, Estimated: >60 mL/min (ref >60)
Glucose, Bld: 272 mg/dL — ABNORMAL HIGH (ref 70–99)
Potassium: 4.3 mmol/L (ref 3.5–5.1)
Sodium: 136 mmol/L (ref 135–145)

## 2023-09-17 LAB — URINALYSIS, MICROSCOPIC (REFLEX): RBC / HPF: NONE SEEN RBC/hpf (ref 0–5)

## 2023-09-17 LAB — CBG MONITORING, ED: Glucose-Capillary: 277 mg/dL — ABNORMAL HIGH (ref 70–99)

## 2023-09-17 MED ORDER — SODIUM CHLORIDE 0.9 % IV BOLUS
1000.0000 mL | Freq: Once | INTRAVENOUS | Status: AC
  Administered 2023-09-18: 1000 mL via INTRAVENOUS

## 2023-09-17 NOTE — ED Provider Notes (Signed)
[place] EMERGENCY DEPARTMENT AT [facility] [place] Provider Note   CSN: [id number] Arrival date & time: 09/17/23  2043     History {Add pertinent medical, surgical, social history, OB history to HPI:1} Chief Complaint  Patient presents with   Hyperglycemia    Gary Grimes is a 62 y.o. male.  Patient with history of type 2 diabetes on insulin, CAD, atrial fibrillation status post ablation, not on anticoagulation, recent diagnosis of bronchitis by his PCP 2 days ago.  Has had URI symptoms with cough and congestion for 1 week.  He was placed on prednisone and doxycycline by his PCP 2 days ago.  Today he comes in because his glucose monitor is reading "high".  States compliance with his insulin regimen which includes 60 units of Humalog in the morning and 70 units at night.  Did take 7 units this evening after eating because his sugar was high.  Still having cough and difficulty breathing.  Still having pain in the left side with coughing.  Not have any pain unless he coughs.  Denies any dizziness, chills, nausea, vomiting, lightheadedness, headache, shortness of breath, abdominal pain or vomiting.  States he is concerned about his blood pressure being elevated and his glucometer being inaccurate.  Denies fever.  Denies any cardiac history denies any reported previous MI.  The history is provided by the patient.  Hyperglycemia Associated symptoms: shortness of breath   Associated symptoms: no abdominal pain, no chest pain, no dizziness, no dysuria, no fever, no nausea, no vomiting and no weakness        Home Medications Prior to Admission medications   Medication Sig Start Date End Date Taking? Authorizing Provider  albuterol (PROVENTIL) (2.5 MG/3ML) 0.083% nebulizer solution Take 3 mLs (2.5 mg total) by nebulization every 6 (six) hours as needed for wheezing or shortness of breath. 09/15/23   Loyola Mast, MD  Ascorbic Acid (VITAMIN C) 1000 MG tablet Take 1,000 mg by mouth  daily.    [provider]  aspirin EC 81 MG tablet Take 1 tablet (81 mg total) by mouth daily. Swallow whole. 10/17/21   Loyola Mast, MD  atomoxetine (STRATTERA) 40 MG capsule Take 1 capsule (40 mg total) by mouth 2 (two) times daily with a meal. 07/19/23   Loyola Mast, MD  atorvastatin (LIPITOR) 10 MG tablet TAKE 1 TABLET BY MOUTH EVERY DAY 07/08/23   Revankar, Aundra Dubin, MD  b complex vitamins capsule Take 2 capsules by mouth daily.    [provider]  cariprazine (VRAYLAR) 3 MG capsule Take 3 mg by mouth daily.    [provider]  clonazePAM (KLONOPIN) 2 MG tablet Take 1 tablet (2 mg total) by mouth daily. 08/03/23   Tomma Lightning, MD  Continuous Glucose Receiver (FREESTYLE LIBRE 2 READER) DEVI Use as advised 07/20/23   Carlus Pavlov, MD  Continuous Glucose Sensor (FREESTYLE LIBRE 2 SENSOR) MISC Use 1 sensor every 2 weeks 07/20/23   Carlus Pavlov, MD  Doxepin HCl 6 MG TABS Take 1 tablet (6 mg total) by mouth at bedtime. 08/03/23   Tomma Lightning, MD  doxycycline (VIBRA-TABS) 100 MG tablet Take 1 tablet (100 mg total) by mouth 2 (two) times daily. 09/15/23   Loyola Mast, MD  hydrOXYzine (ATARAX) 25 MG tablet Take 25 mg by mouth daily as needed for anxiety or itching. 06/22/23   [provider]  ibuprofen (ADVIL) 800 MG tablet Take 1 tablet (800 mg total) by mouth  every 8 (eight) hours as needed. 04/28/23   Vickki Hearing, MD  insulin degludec (TRESIBA FLEXTOUCH) 200 UNIT/ML FlexTouch Pen Inject 20 Units into the skin daily. 20 up to 30 units daily 07/08/23   Carlus Pavlov, MD  Insulin Pen Needle 32G X 4 MM MISC Use 1x a day 02/25/23   Carlus Pavlov, MD  insulin regular human CONCENTRATED (HUMULIN R U-500 KWIKPEN) 500 UNIT/ML KwikPen Inject under skin 90-100 units in am and 50-70 units before dinner Patient taking differently: Inject 50-70 Units into the skin See admin instructions. Inject under skin 70  units in am and 50 units before  dinner 04/08/23   Carlus Pavlov, MD  lamoTRIgine (LAMICTAL) 100 MG tablet Take 25 mg by mouth at bedtime. Patient not taking: Reported on 09/15/2023 02/05/23   [provider]  loperamide (IMODIUM A-D) 2 MG tablet Take 1 tablet (2 mg total) by mouth 4 (four) times daily as needed for diarrhea or loose stools. 07/22/22   Garnette Gunner, MD  methocarbamol (ROBAXIN) 750 MG tablet Take 1 tablet (750 mg total) by mouth 4 (four) times daily. 07/22/23   Vickki Hearing, MD  methylPREDNISolone (MEDROL DOSEPAK) 4 MG TBPK tablet Take per package instructions. 09/15/23   Loyola Mast, MD  olmesartan (BENICAR) 40 MG tablet Take 1 tablet (40 mg total) by mouth daily. 07/19/23   Loyola Mast, MD  phentermine (ADIPEX-P) 37.5 MG tablet Take 37.5 mg by mouth every morning. 03/23/23   [provider]  pregabalin (LYRICA) 200 MG capsule Take 1 capsule (200 mg total) by mouth every morning AND 2 capsules (400 mg total) every evening. 07/19/23   Loyola Mast, MD  promethazine-codeine (PHENERGAN WITH CODEINE) 6.25-10 MG/5ML syrup Take 10 mLs by mouth every 6 (six) hours as needed for cough. 09/15/23   Loyola Mast, MD  traZODone (DESYREL) 100 MG tablet Take 2 tablets (200 mg total) by mouth at bedtime. 07/19/23   Loyola Mast, MD      Allergies    Morphine and Semaglutide    Review of Systems   Review of Systems  Constitutional:  Negative for activity change, appetite change and fever.  HENT:  Negative for congestion and rhinorrhea.   Respiratory:  Positive for chest tightness and shortness of breath.   Cardiovascular:  Negative for chest pain.  Gastrointestinal:  Negative for abdominal pain, nausea and vomiting.  Genitourinary:  Negative for dysuria and hematuria.  Musculoskeletal:  Negative for arthralgias and myalgias.  Skin:  Negative for rash.  Neurological:  Negative for dizziness, weakness, light-headedness and headaches.   all other systems are negative except as noted in  the HPI and PMH.    Physical Exam Updated Vital Signs BP 128/69   Pulse 88   Temp 98 F (36.7 C)   Resp 18   Ht 5\' 11"  (1.803 m)   Wt (!) 138 kg   SpO2 98%   BMI 42.43 kg/m  Physical Exam Vitals and nursing note reviewed.  Constitutional:      General: He is not in acute distress.    Appearance: He is well-developed.     Comments: Dry cough  HENT:     Head: Normocephalic and atraumatic.     Mouth/Throat:     Pharynx: No oropharyngeal exudate.  Eyes:     Conjunctiva/sclera: Conjunctivae normal.     Pupils: Pupils are equal, round, and reactive to light.  Neck:     Comments: No meningismus. Cardiovascular:  Rate and Rhythm: Normal rate and regular rhythm.     Heart sounds: Normal heart sounds. No murmur heard. Pulmonary:     Effort: Pulmonary effort is normal. No respiratory distress.     Breath sounds: Normal breath sounds.  Abdominal:     Palpations: Abdomen is soft.     Tenderness: There is no abdominal tenderness. There is no guarding or rebound.  Musculoskeletal:        General: No tenderness. Normal range of motion.     Cervical back: Normal range of motion and neck supple.  Skin:    General: Skin is warm.  Neurological:     Mental Status: He is alert and oriented to person, place, and time.     Cranial Nerves: No cranial nerve deficit.     Motor: No abnormal muscle tone.     Coordination: Coordination normal.     Comments:  5/5 strength throughout. CN 2-12 intact.Equal grip strength.   Psychiatric:        Behavior: Behavior normal.     ED Results / Procedures / Treatments   Labs (all labs ordered are listed, but only abnormal results are displayed) Labs Reviewed  BASIC METABOLIC PANEL - Abnormal; Notable for the following components:      Result Value   Chloride 97 (*)    Glucose, Bld 272 (*)    BUN 38 (*)    Creatinine, Ser 1.28 (*)    All other components within normal limits  CBC - Abnormal; Notable for the following components:   WBC 11.9  (*)    All other components within normal limits  URINALYSIS, ROUTINE W REFLEX MICROSCOPIC - Abnormal; Notable for the following components:   Glucose, UA >=500 (*)    All other components within normal limits  URINALYSIS, MICROSCOPIC (REFLEX) - Abnormal; Notable for the following components:   Bacteria, UA RARE (*)    All other components within normal limits  CBG MONITORING, ED - Abnormal; Notable for the following components:   Glucose-Capillary 277 (*)    All other components within normal limits  SARS CORONAVIRUS 2 BY RT PCR  D-DIMER, QUANTITATIVE  CBG MONITORING, ED  TROPONIN I (HIGH SENSITIVITY)    EKG None  Radiology No results found.  Procedures Procedures  {Document cardiac monitor, telemetry assessment procedure when appropriate:1}  Medications Ordered in ED Medications  sodium chloride 0.9 % bolus 1,000 mL (has no administration in time range)    ED Course/ Medical Decision Making/ A&P   {   Click here for ABCD2, HEART and other calculatorsREFRESH Note before signing :1}                              Medical Decision Making Amount and/or Complexity of Data Reviewed Labs: ordered. Decision-making details documented in ED Course. Radiology: ordered and independent interpretation performed. Decision-making details documented in ED Course. ECG/medicine tests: ordered and independent interpretation performed. Decision-making details documented in ED Course.  Hyperglycemia in setting of steroid use.  No hypoxia or increased work of breathing.  Lungs are clear bilaterally.  Will check EKG and chest x-ray given his chest pain with coughing.  Labs show hyperglycemia of 272.  No evidence of DKA.  Given IV fluids.  {Document critical care time when appropriate:1} {Document review of labs and clinical decision tools ie heart score, Chads2Vasc2 etc:1}  {Document your independent review of radiology images, and any outside records:1} {Document your discussion with  family members, caretakers, and with consultants:1} {Document social determinants of health affecting pt's care:1} {Document your decision making why or why not admission, treatments were needed:1} Final Clinical Impression(s) / ED Diagnoses Final diagnoses:  None    Rx / DC Orders ED Discharge Orders     None

## 2023-09-17 NOTE — ED Triage Notes (Signed)
Pt Dx with bronchitis and given steroids, now his Freestyle libre 2  meter is reading high.

## 2023-09-18 LAB — SARS CORONAVIRUS 2 BY RT PCR: SARS Coronavirus 2 by RT PCR: NEGATIVE

## 2023-09-18 LAB — D-DIMER, QUANTITATIVE: D-Dimer, Quant: 0.31 ug/mL-FEU (ref 0.00–0.50)

## 2023-09-18 LAB — CBG MONITORING, ED
Glucose-Capillary: 133 mg/dL — ABNORMAL HIGH (ref 70–99)
Glucose-Capillary: 151 mg/dL — ABNORMAL HIGH (ref 70–99)

## 2023-09-18 LAB — TROPONIN I (HIGH SENSITIVITY)
Troponin I (High Sensitivity): 6 ng/L (ref <18)
Troponin I (High Sensitivity): 6 ng/L (ref <18)

## 2023-09-18 NOTE — Discharge Instructions (Signed)
Monitor your blood sugars carefully while taking the prednisone.  May require more insulin than usual.  Your testing is negative for heart attack or blood clot in the lung.  Follow-up with your doctor.  Return to the ED with exertional chest pain, pain associate with shortness of breath, nausea, vomiting, sweating or other concerns.

## 2023-09-20 ENCOUNTER — Telehealth: Payer: Self-pay

## 2023-09-20 ENCOUNTER — Telehealth: Payer: Self-pay | Admitting: Family Medicine

## 2023-09-20 DIAGNOSIS — J40 Bronchitis, not specified as acute or chronic: Secondary | ICD-10-CM

## 2023-09-20 MED ORDER — ALBUTEROL SULFATE (2.5 MG/3ML) 0.083% IN NEBU: 2.5000 mg | INHALATION_SOLUTION | Freq: Four times a day (QID) | RESPIRATORY_TRACT | 1 refills | Status: DC | PRN

## 2023-09-20 NOTE — Transitions of Care (Post Inpatient/ED Visit) (Signed)
09/20/2023  Name: Gary Grimes MRN: [id number] DOB: 11-11-1961  Today's TOC FU Call Status: Today's TOC FU Call Status:: Successful TOC FU Call Completed TOC FU Call Complete Date: 09/20/23 Patient's Name and Date of Birth confirmed.  Transition Care Management Follow-up Telephone Call Date of Discharge: 09/18/23 Discharge Facility: [facility] [place] Type of Discharge: Emergency Department How have you been since you were released from the hospital?: Better Any questions or concerns?: No  Items Reviewed: Did you receive and understand the discharge instructions provided?: Yes Medications obtained,verified, and reconciled?: Yes (Medications Reviewed) Any new allergies since your discharge?: No Dietary orders reviewed?: NA Do you have support at home?: Yes  Medications Reviewed Today: Medications Reviewed Today     Reviewed by Larey Dresser, RN (Registered Nurse) on 09/20/23 at 1612  Med List Status: <None>   Medication Order Taking? Sig Documenting Provider Last Dose Status Informant  albuterol (PROVENTIL) (2.5 MG/3ML) 0.083% nebulizer solution [id number] Yes Take 3 mLs (2.5 mg total) by nebulization every 6 (six) hours as needed for wheezing or shortness of breath. Loyola Mast, MD Taking Active   Ascorbic Acid (VITAMIN C) 1000 MG tablet [id number] Yes Take 1,000 mg by mouth daily. [provider] Taking Active Self  aspirin EC 81 MG tablet [id number] Yes Take 1 tablet (81 mg total) by mouth daily. Swallow whole. Loyola Mast, MD Taking Active Self  atomoxetine (STRATTERA) 40 MG capsule [id number] Yes Take 1 capsule (40 mg total) by mouth 2 (two) times daily with a meal. Loyola Mast, MD Taking Active   atorvastatin (LIPITOR) 10 MG tablet [id number] Yes TAKE 1 TABLET BY MOUTH EVERY DAY Revankar, Aundra Dubin, MD Taking Active   b complex vitamins capsule [id number] Yes Take 2 capsules by mouth daily. [provider] Taking Active Self  cariprazine  (VRAYLAR) 3 MG capsule [id number] Yes Take 3 mg by mouth daily. [provider] Taking Active   clonazePAM (KLONOPIN) 2 MG tablet [id number] Yes Take 1 tablet (2 mg total) by mouth daily. Tomma Lightning, MD Taking Active   Continuous Glucose Receiver (FREESTYLE LIBRE 2 READER) DEVI [id number] Yes Use as advised Carlus Pavlov, MD Taking Active   Continuous Glucose Sensor (FREESTYLE LIBRE 2 SENSOR) [place] [id number] Yes Use 1 sensor every 2 weeks Carlus Pavlov, MD Taking Active   Doxepin HCl 6 MG TABS [id number] Yes Take 1 tablet (6 mg total) by mouth at bedtime. Tomma Lightning, MD Taking Active   doxycycline (VIBRA-TABS) 100 MG tablet [id number] Yes Take 1 tablet (100 mg total) by mouth 2 (two) times daily. Loyola Mast, MD Taking Active   Hyaluronan (ORTHOVISC) intra-articular injection 1 mg [id number]   Vickki Hearing, MD  Active   hydrOXYzine (ATARAX) 25 MG tablet [id number] Yes Take 25 mg by mouth daily as needed for anxiety or itching. [provider] Taking Active   ibuprofen (ADVIL) 800 MG tablet [id number] Yes Take 1 tablet (800 mg total) by mouth every 8 (eight) hours as needed. Vickki Hearing, MD Taking Active            Med Note Tiburcio Pea, ABBIE R   Fri Aug 27, 2023  3:20 PM)    insulin degludec (TRESIBA FLEXTOUCH) 200 UNIT/ML FlexTouch Pen [id number] Yes Inject 20 Units into the skin daily. 20 up to 30 units daily Carlus Pavlov, MD Taking Active   Insulin Pen Needle 32G X 4 MM MISC [id number] Yes Use 1x a day Carlus Pavlov, MD Taking Active  Self  insulin regular human CONCENTRATED (HUMULIN R U-500 KWIKPEN) 500 UNIT/ML KwikPen [id number] Yes Inject under skin 90-100 units in am and 50-70 units before dinner  Patient taking differently: Inject 50-70 Units into the skin See admin instructions. Inject under skin 70  units in am and 50 units before dinner   Carlus Pavlov, MD Taking Active Self  lamoTRIgine (LAMICTAL) 100 MG tablet [id number] No Take 25  mg by mouth at bedtime.  Patient not taking: Reported on 09/15/2023   [provider] Not Taking Active Self  loperamide (IMODIUM A-D) 2 MG tablet [id number] Yes Take 1 tablet (2 mg total) by mouth 4 (four) times daily as needed for diarrhea or loose stools. Garnette Gunner, MD Taking Active Self  methocarbamol (ROBAXIN) 750 MG tablet [id number] Yes Take 1 tablet (750 mg total) by mouth 4 (four) times daily. Vickki Hearing, MD Taking Active   methylPREDNISolone (MEDROL DOSEPAK) 4 MG TBPK tablet [id number] Yes Take per package instructions. Loyola Mast, MD Taking Active   olmesartan [facility]) 40 MG tablet [id number] Yes Take 1 tablet (40 mg total) by mouth daily. Loyola Mast, MD Taking Active   phentermine (ADIPEX-P) 37.5 MG tablet [id number] Yes Take 37.5 mg by mouth every morning. [provider] Taking Active Self           Med Note Karoline Caldwell, AMY W   Fri Jun 11, 2023 10:09 AM)    pregabalin (LYRICA) 200 MG capsule [id number] Yes Take 1 capsule (200 mg total) by mouth every morning AND 2 capsules (400 mg total) every evening. Loyola Mast, MD Taking Active   promethazine-codeine [facility] WITH CODEINE) 6.25-10 MG/5ML syrup [id number] Yes Take 10 mLs by mouth every 6 (six) hours as needed for cough. Loyola Mast, MD Taking Active   traZODone (DESYREL) 100 MG tablet [id number] Yes Take 2 tablets (200 mg total) by mouth at bedtime. Loyola Mast, MD Taking Active             Home Care and Equipment/Supplies: Were Home Health Services Ordered?: NA Any new equipment or medical supplies ordered?: NA  Functional Questionnaire: Do you need assistance with bathing/showering or dressing?: No Do you need assistance with meal preparation?: No Do you need assistance with eating?: No Do you have difficulty maintaining continence: No Do you need assistance with getting out of bed/getting out of a chair/moving?: No Do you have difficulty managing or taking your  medications?: No  Follow up appointments reviewed: PCP Follow-up appointment confirmed?: NA Specialist Hospital Follow-up appointment confirmed?: NA Do you need transportation to your follow-up appointment?: No Do you understand care options if your condition(s) worsen?: Yes-patient verbalized understanding    SIGNATURE Arvil Persons, BSN, RN

## 2023-09-20 NOTE — Telephone Encounter (Signed)
RX sent to the pharmacy and patient notified VIA phone. Dm/cma

## 2023-09-20 NOTE — Telephone Encounter (Signed)
Prescription Request  09/20/2023  LOV: 09/15/2023  What is the name of the medication or equipment? albuterol (PROVENTIL) (2.5 MG/3ML) 0.083% nebulizer solution [[id number]]   Have you contacted your pharmacy to request a refill? Yes,   Which pharmacy would you like this sent to?  [facility] - [place], [place] - [address] [place] [address] Phone: [phone] Fax: [phone]     Patient notified that their request is being sent to the clinical staff for review and that they should receive a response within 2 business days.   Please advise at [facility] [phone]

## 2023-09-22 ENCOUNTER — Telehealth: Payer: Self-pay | Admitting: Family Medicine

## 2023-09-22 ENCOUNTER — Encounter: Payer: Self-pay | Admitting: Family Medicine

## 2023-09-22 ENCOUNTER — Ambulatory Visit: Payer: BC Managed Care – PPO

## 2023-09-22 ENCOUNTER — Ambulatory Visit: Payer: BC Managed Care – PPO | Admitting: Family Medicine

## 2023-09-22 VITALS — BP 146/82 | HR 88 | Temp 97.2°F | Ht 71.0 in | Wt 308.0 lb

## 2023-09-22 DIAGNOSIS — J40 Bronchitis, not specified as acute or chronic: Secondary | ICD-10-CM

## 2023-09-22 DIAGNOSIS — Z23 Encounter for immunization: Secondary | ICD-10-CM

## 2023-09-22 MED ORDER — PROMETHAZINE-CODEINE 6.25-10 MG/5ML PO SYRP: 10.0000 mL | ORAL_SOLUTION | Freq: Four times a day (QID) | ORAL | 0 refills | Status: DC | PRN

## 2023-09-22 MED ORDER — ALBUTEROL SULFATE (2.5 MG/3ML) 0.083% IN NEBU: 2.5000 mg | INHALATION_SOLUTION | Freq: Four times a day (QID) | RESPIRATORY_TRACT | 2 refills | Status: DC | PRN

## 2023-09-22 NOTE — Telephone Encounter (Signed)
RX was sent 09/20/23 to [facility], but wil send again just in case it wasn't received.  Dm/cma

## 2023-09-22 NOTE — Telephone Encounter (Signed)
.  0Prescription Request  09/22/2023  LOV: 09/22/2023  What is the name of the medication or equipment? An inhaler, not the albuterol (PROVENTIL) (2.5 MG/3ML) 0.083% nebulizer solution [[id number]]   Have you contacted your pharmacy to request a refill? Yes   Which pharmacy would you like this sent to?  [facility] [place], [place] - [address] [place] [address] Phone: [phone] Fax: [phone]     Patient notified that their request is being sent to the clinical staff for review and that they should receive a response within 2 business days.   Please advise at [facility] [phone]
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
## 2023-09-22 NOTE — Assessment & Plan Note (Signed)
Much improved at this point. I recommend he complete his course of antibiotics. I reassured him that the increased blood sugar was a side effect of the steroid and a temporary issue. I will renew his cough syrup.

## 2023-09-24 MED ORDER — ALBUTEROL SULFATE HFA 108 (90 BASE) MCG/ACT IN AERS
2.0000 | INHALATION_SPRAY | Freq: Four times a day (QID) | RESPIRATORY_TRACT | 2 refills | Status: DC | PRN
Start: 2023-09-24 — End: 2023-12-20

## 2023-09-24 NOTE — Telephone Encounter (Signed)
Please call the patient . Pt still have not got the inhaler and he stated he needs it bad

## 2023-09-24 NOTE — Addendum Note (Signed)
Addended by: Loyola Mast on: 09/24/2023 06:03 PM   Modules accepted: Orders

## 2023-09-24 NOTE — Telephone Encounter (Signed)
Patient would like to get an inhaler to help with the wheezing.  Can you please send one to [facility]? Thanks . Dm/cma

## 2023-09-27 NOTE — Telephone Encounter (Signed)
Lft detailed VM that RX for inhaler was sent to his pharmacy.  Dm/cma

## 2023-09-29 DIAGNOSIS — I10 Essential (primary) hypertension: Secondary | ICD-10-CM | POA: Diagnosis not present

## 2023-09-29 DIAGNOSIS — E785 Hyperlipidemia, unspecified: Secondary | ICD-10-CM | POA: Diagnosis not present

## 2023-09-29 DIAGNOSIS — E119 Type 2 diabetes mellitus without complications: Secondary | ICD-10-CM | POA: Diagnosis not present

## 2023-09-29 DIAGNOSIS — E669 Obesity, unspecified: Secondary | ICD-10-CM | POA: Diagnosis not present

## 2023-10-01 ENCOUNTER — Ambulatory Visit: Payer: BC Managed Care – PPO | Admitting: Orthopedic Surgery

## 2023-10-04 ENCOUNTER — Telehealth: Payer: Self-pay | Admitting: *Deleted

## 2023-10-04 NOTE — Telephone Encounter (Signed)
Pt has decided he needs to wear the Zio Monitor and so will look at instructions when he gets home from being out of town and put it on. Pt verbalized understanding and had no further questions.

## 2023-10-08 ENCOUNTER — Other Ambulatory Visit: Payer: Self-pay | Admitting: Internal Medicine

## 2023-10-11 ENCOUNTER — Telehealth: Payer: Self-pay | Admitting: Cardiology

## 2023-10-11 NOTE — Telephone Encounter (Signed)
Called patient and reviewed the instructions for the zio heart monitor with the patient. Patient verbalized understanding and had no further questions at this time.

## 2023-10-11 NOTE — Telephone Encounter (Signed)
Patient stated he finally got the heart monitor on.  He placed it on Saturday.  Patient wants a call back regarding questions.

## 2023-10-13 ENCOUNTER — Telehealth: Payer: Self-pay | Admitting: Pulmonary Disease

## 2023-10-13 DIAGNOSIS — E669 Obesity, unspecified: Secondary | ICD-10-CM | POA: Diagnosis not present

## 2023-10-13 DIAGNOSIS — R5382 Chronic fatigue, unspecified: Secondary | ICD-10-CM | POA: Diagnosis not present

## 2023-10-13 DIAGNOSIS — E119 Type 2 diabetes mellitus without complications: Secondary | ICD-10-CM | POA: Diagnosis not present

## 2023-10-13 DIAGNOSIS — K5900 Constipation, unspecified: Secondary | ICD-10-CM | POA: Diagnosis not present

## 2023-10-13 NOTE — Telephone Encounter (Signed)
Called patient.  Patient wants Belsomra sent to new pharmacy, [facility].  Dr. Val Eagle, please advise.

## 2023-10-13 NOTE — Telephone Encounter (Signed)
Patient states needs refill for Bellsomra. States has severe insonmia. Patient out of medication. Pharmacy is [facility] [place]. Patient phone number is [phone].

## 2023-10-14 NOTE — Telephone Encounter (Signed)
Spoke to patient and relayed below message/recommendations. He voiced his understanding. I have provided him with suicide hotline number. He is aware that Dr. Wynona Neat will be back in  office 10/18/2023.

## 2023-10-14 NOTE — Telephone Encounter (Addendum)
Chart reviewed - it looks like he was previously on this medication and it was discontinued due to not being effective. Looks like this was switched maybe to clonazepam in May 2024.  I am not comfortable refilling this medication with Dr. Villa Herb approval - this is not a straightforward refill he is asking about, it is a re-initiation. He also has complicated symptoms of depression and I do not feel comfortable calling this in. Will have to wait until Dr. Villa Herb return.   Also if he genuinely has thoughts of self harm we need to advise he call suicide hotline at 48 or seek emergency medical care.

## 2023-10-14 NOTE — Telephone Encounter (Signed)
Called and spoke to patient.  He is requesting a Rx for Belsomra. He stated that Klonopin and Doxepin is not effective for him. He stated that he has severe insomnia and he is in desperate need of  Belsomra. He stated that he was awake all night last night. He finally took Klonopin in the early morning hours without success. He stated that he is almost in a crisis. He stated that when he gets this way, he becomes suicidal and he is in desperate need.  Dr. Celine Mans, please advise. Dr. Wynona Neat is unavailable.

## 2023-10-14 NOTE — Telephone Encounter (Signed)
Patient checking on message for medication. States did not sleep last night. Has severe insonmia. Patient phone number is [phone].

## 2023-10-15 NOTE — Telephone Encounter (Signed)
Routing this to Dr. Val Eagle so he will be able to review once back at the office.

## 2023-10-19 NOTE — Telephone Encounter (Signed)
AO, please advise.   Patient is aware that we are awaiting response from Dr. Wynona Neat.

## 2023-10-19 NOTE — Telephone Encounter (Signed)
Patient calling back to check status of Belsomra refill. States has severe insomnia, and does not like to take clonazepam because it can be very addictive. Patient out of medication. Pharmacy is [facility] [place]. Patient phone number is [phone].

## 2023-10-20 ENCOUNTER — Ambulatory Visit: Payer: BC Managed Care – PPO | Admitting: Orthopedic Surgery

## 2023-10-21 ENCOUNTER — Ambulatory Visit: Payer: BC Managed Care – PPO | Admitting: Family Medicine

## 2023-10-22 ENCOUNTER — Encounter: Payer: Self-pay | Admitting: Pulmonary Disease

## 2023-10-22 ENCOUNTER — Other Ambulatory Visit: Payer: Self-pay | Admitting: Pulmonary Disease

## 2023-10-22 ENCOUNTER — Telehealth: Payer: Self-pay | Admitting: Pulmonary Disease

## 2023-10-22 MED ORDER — BELSOMRA 20 MG PO TABS: 20.0000 mg | ORAL_TABLET | Freq: Every evening | ORAL | 1 refills | Status: DC

## 2023-10-22 NOTE — Telephone Encounter (Signed)
Patient is aware that we are awaiting a response from Dr. Wynona Neat.  He became upset and stated that he is 62 years old and he has been going to the dr his whole life and has never had to wait this long for a response. He stated that this is unbelievable.   OA, please advise.

## 2023-10-22 NOTE — Telephone Encounter (Signed)
Spoke with Dr. Mervyn Skeeters at [facility]  He is on Vraylar for bipolar disorder and seems to be doing well with it   Needs to be encouraged to call behavioral health whenever he is having suicidal ideation

## 2023-10-22 NOTE — Telephone Encounter (Signed)
Patient is calling back wanting to know what is taking so long for a response. He still needs Belsomra. He states that he told his family it will not be good if something happens to him from not receiving any rest.

## 2023-10-25 ENCOUNTER — Other Ambulatory Visit: Payer: Self-pay | Admitting: Pulmonary Disease

## 2023-10-25 ENCOUNTER — Other Ambulatory Visit: Payer: Self-pay | Admitting: Internal Medicine

## 2023-10-26 ENCOUNTER — Other Ambulatory Visit (HOSPITAL_COMMUNITY): Payer: Self-pay

## 2023-10-26 ENCOUNTER — Telehealth: Payer: Self-pay

## 2023-10-26 NOTE — Telephone Encounter (Signed)
Pharmacy Patient Advocate Encounter   Received notification from Patient Advice Request messages that prior authorization for Belsomra 20MG  tablets is required/requested.   Insurance verification completed.   The patient is insured through [facility] .   Per test claim: PA required; PA submitted to above mentioned insurance via CoverMyMeds Key/confirmation #/EOC [id number] Status is pending

## 2023-10-27 DIAGNOSIS — K5900 Constipation, unspecified: Secondary | ICD-10-CM | POA: Diagnosis not present

## 2023-10-27 DIAGNOSIS — E119 Type 2 diabetes mellitus without complications: Secondary | ICD-10-CM | POA: Diagnosis not present

## 2023-10-27 DIAGNOSIS — R5382 Chronic fatigue, unspecified: Secondary | ICD-10-CM | POA: Diagnosis not present

## 2023-10-27 DIAGNOSIS — E669 Obesity, unspecified: Secondary | ICD-10-CM | POA: Diagnosis not present

## 2023-10-29 NOTE — Telephone Encounter (Signed)
Pharmacy Patient Advocate Encounter  Received notification from [facility] that Prior Authorization for Belsomra 20MG  tablets has been APPROVED from 10-26-2023 to 10-23-2024   PA #/Case ID/Reference #: [id number]

## 2023-11-01 ENCOUNTER — Other Ambulatory Visit: Payer: Self-pay | Admitting: Pulmonary Disease

## 2023-11-04 ENCOUNTER — Ambulatory Visit: Payer: BC Managed Care – PPO | Admitting: Pulmonary Disease

## 2023-11-04 ENCOUNTER — Encounter: Payer: Self-pay | Admitting: Pulmonary Disease

## 2023-11-04 VITALS — BP 130/80 | HR 80 | Ht 71.0 in | Wt 311.4 lb

## 2023-11-04 DIAGNOSIS — G4733 Obstructive sleep apnea (adult) (pediatric): Secondary | ICD-10-CM | POA: Diagnosis not present

## 2023-11-04 DIAGNOSIS — F5104 Psychophysiologic insomnia: Secondary | ICD-10-CM | POA: Diagnosis not present

## 2023-11-04 NOTE — Patient Instructions (Signed)
I will see you back in about 4 months  I hope the Belsomra does work for you  Call with significant concerns
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
## 2023-11-07 NOTE — Telephone Encounter (Signed)
Patient seen in office.  Concerns addressed

## 2023-11-10 DIAGNOSIS — E785 Hyperlipidemia, unspecified: Secondary | ICD-10-CM | POA: Diagnosis not present

## 2023-11-10 DIAGNOSIS — E669 Obesity, unspecified: Secondary | ICD-10-CM | POA: Diagnosis not present

## 2023-11-10 DIAGNOSIS — I10 Essential (primary) hypertension: Secondary | ICD-10-CM | POA: Diagnosis not present

## 2023-11-10 DIAGNOSIS — E119 Type 2 diabetes mellitus without complications: Secondary | ICD-10-CM | POA: Diagnosis not present

## 2023-11-11 ENCOUNTER — Ambulatory Visit: Payer: BC Managed Care – PPO | Admitting: Internal Medicine

## 2023-11-11 ENCOUNTER — Encounter: Payer: Self-pay | Admitting: Internal Medicine

## 2023-11-11 VITALS — BP 122/80 | HR 92 | Ht 71.0 in | Wt 324.0 lb

## 2023-11-11 DIAGNOSIS — Z794 Long term (current) use of insulin: Secondary | ICD-10-CM | POA: Diagnosis not present

## 2023-11-11 DIAGNOSIS — E119 Type 2 diabetes mellitus without complications: Secondary | ICD-10-CM

## 2023-11-11 DIAGNOSIS — E782 Mixed hyperlipidemia: Secondary | ICD-10-CM | POA: Diagnosis not present

## 2023-11-11 LAB — POCT GLYCOSYLATED HEMOGLOBIN (HGB A1C): Hemoglobin A1C: 7.9 % — AB (ref 4.0–5.6)

## 2023-11-11 MED ORDER — HUMULIN R U-500 KWIKPEN 500 UNIT/ML ~~LOC~~ SOPN
PEN_INJECTOR | SUBCUTANEOUS | 3 refills | Status: AC
Start: 2023-11-11 — End: ?

## 2023-11-11 MED ORDER — TRESIBA FLEXTOUCH 200 UNIT/ML ~~LOC~~ SOPN
30.0000 [IU] | PEN_INJECTOR | Freq: Every day | SUBCUTANEOUS | 3 refills | Status: AC
Start: 2023-11-11 — End: ?
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
## 2023-11-11 NOTE — Patient Instructions (Addendum)
Please use the following regimen:  - U500 - 45 to 60 minutes before a meal: - 80-90 units before b'fast  - 50-70 units before dinner  - Tresiba 26 units daily and increase by 2-4 units every 2-4 days until sugars become controlled between meals  Please return in 3-4 months.

## 2023-11-12 LAB — LDL CHOLESTEROL, DIRECT: Direct LDL: 51 mg/dL

## 2023-11-12 LAB — LIPID PANEL
Cholesterol: 145 mg/dL (ref 0–200)
HDL: 40.5 mg/dL (ref >39.00)
Total CHOL/HDL Ratio: 4
Triglycerides: 812 mg/dL — ABNORMAL HIGH (ref 0.0–149.0)

## 2023-11-12 LAB — MICROALBUMIN / CREATININE URINE RATIO
Creatinine,U: 137.3 mg/dL
Microalb Creat Ratio: 0.5 mg/g (ref 0.0–30.0)
Microalb, Ur: <0.7 mg/dL (ref 0.0–1.9)

## 2023-11-12 MED ORDER — FENOFIBRATE 145 MG PO TABS
145.0000 mg | ORAL_TABLET | Freq: Every day | ORAL | 3 refills | Status: DC
Start: 2023-11-12 — End: 2024-01-17

## 2023-11-24 ENCOUNTER — Telehealth: Payer: Self-pay | Admitting: *Deleted

## 2023-11-24 DIAGNOSIS — I480 Paroxysmal atrial fibrillation: Secondary | ICD-10-CM

## 2023-11-24 NOTE — Telephone Encounter (Signed)
Left message for pt to call back about zio monitor he wore recently. Company got it back unused.

## 2023-11-24 NOTE — Telephone Encounter (Signed)
Pt called back about monitor and says he would like to wear another monitor. Also wanted Dr. Tomie China to know that his Triglycerides were 812 when he went to his Endocrinologist. He was put on Fenofibrate and will follow up with them in 2 months. Spoke with Neeral from [facility] and he said we could put monitor on pt in office and text him the serial number.

## 2023-11-24 NOTE — Telephone Encounter (Signed)
Left vm to return call or arrive at 1:00 for monitor.

## 2023-12-01 ENCOUNTER — Ambulatory Visit: Payer: BC Managed Care – PPO | Admitting: Orthopedic Surgery

## 2023-12-01 DIAGNOSIS — M1812 Unilateral primary osteoarthritis of first carpometacarpal joint, left hand: Secondary | ICD-10-CM | POA: Diagnosis not present
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
## 2023-12-08 ENCOUNTER — Ambulatory Visit: Payer: BC Managed Care – PPO | Attending: Cardiology

## 2023-12-08 ENCOUNTER — Telehealth: Payer: Self-pay | Admitting: Cardiology

## 2023-12-08 DIAGNOSIS — I480 Paroxysmal atrial fibrillation: Secondary | ICD-10-CM

## 2023-12-08 NOTE — Telephone Encounter (Signed)
Spoke with Neeral about some monitors and he asked me to put a new order in for this pt so he can transmit the results once they come in. Device [id number]. Registered device

## 2023-12-10 ENCOUNTER — Ambulatory Visit: Payer: BC Managed Care – PPO | Admitting: Family Medicine

## 2023-12-10 VITALS — BP 150/84 | Temp 98.7°F | Ht 71.0 in | Wt 319.4 lb

## 2023-12-10 DIAGNOSIS — N529 Male erectile dysfunction, unspecified: Secondary | ICD-10-CM | POA: Insufficient documentation

## 2023-12-10 DIAGNOSIS — Z7985 Long-term (current) use of injectable non-insulin antidiabetic drugs: Secondary | ICD-10-CM

## 2023-12-10 DIAGNOSIS — Z6841 Body Mass Index (BMI) 40.0 and over, adult: Secondary | ICD-10-CM

## 2023-12-10 DIAGNOSIS — N521 Erectile dysfunction due to diseases classified elsewhere: Secondary | ICD-10-CM | POA: Diagnosis not present

## 2023-12-10 DIAGNOSIS — I10 Essential (primary) hypertension: Secondary | ICD-10-CM

## 2023-12-10 DIAGNOSIS — Z794 Long term (current) use of insulin: Secondary | ICD-10-CM

## 2023-12-10 DIAGNOSIS — E1140 Type 2 diabetes mellitus with diabetic neuropathy, unspecified: Secondary | ICD-10-CM

## 2023-12-10 MED ORDER — TIRZEPATIDE 5 MG/0.5ML ~~LOC~~ SOAJ: 5.0000 mg | SUBCUTANEOUS | 0 refills | Status: DC

## 2023-12-10 MED ORDER — TADALAFIL 10 MG PO TABS: 10.0000 mg | ORAL_TABLET | Freq: Every day | ORAL | 0 refills | Status: DC | PRN

## 2023-12-10 MED ORDER — TIRZEPATIDE 2.5 MG/0.5ML ~~LOC~~ SOAJ: 2.5000 mg | SUBCUTANEOUS | 2 refills | Status: DC

## 2023-12-10 NOTE — Assessment & Plan Note (Signed)
Maximum weight: 325 lbs (04/2023) Current weight: 319 lbs Weight change since last visit: - 5 lbs Total weight loss: - 6 lbs (1.8%)  Gary Grimes has obesity with multiple serious co-morbidities. However, he is not tolerating phentermine due to increased blood pressure. I have advised him to stop this. As we are adding tirzepatide Gary Grimes) for diabetes management, we will anticipate some weight loss with this approach.
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
## 2023-12-10 NOTE — Assessment & Plan Note (Signed)
Diabetes has not been meeting goals. Dr. Elvera Lennox had considered adding a GLP-1 RA, but noted a history of pancreatitis. Patient had previously told me that he had pancreatitis when on semaglutide in the past. I reviewed his old record. On 02/28/2021, he was seen at [facility] with GI upset and some mild RUQ abdominal pain. His lipase was found to be mildly elevated at 91 U/L. Other LFTs were normal at that time. He had been receiving semaglutide (Ozempic) prior to this. He was seen back and admitted to [facility] from 3/14-3/22/2022 with what was felt to be gastroparesis. His lipase on 3/14 was normal, at 47 U/L. He has never had any other documented episodes of elevated lipase. Based on this review, I do think it would be reasonable to try him on tirzepatide [facility]) to improve his diabetes control and to assist with weight loss. I will start him on 2.5 mg weekly for 28 days and then step up to 5 mg weekly. We reviewed medication use, common symptoms, and warning signs of pancreatitis. I will plan to see him back before he completes his 2nd month on the medicine.

## 2023-12-10 NOTE — Assessment & Plan Note (Signed)
Blood pressure is high today. I recommend we taper and stop Mr. Debord' phentermine. Continue olmesartan 40 mg daily.

## 2023-12-14 ENCOUNTER — Other Ambulatory Visit: Payer: Self-pay

## 2023-12-14 ENCOUNTER — Telehealth: Payer: Self-pay | Admitting: Family Medicine

## 2023-12-14 ENCOUNTER — Ambulatory Visit: Payer: BC Managed Care – PPO | Admitting: Family Medicine

## 2023-12-14 DIAGNOSIS — E1140 Type 2 diabetes mellitus with diabetic neuropathy, unspecified: Secondary | ICD-10-CM

## 2023-12-14 MED ORDER — TIRZEPATIDE 5 MG/0.5ML ~~LOC~~ SOAJ: 5.0000 mg | SUBCUTANEOUS | 0 refills | Status: DC

## 2023-12-14 MED ORDER — TIRZEPATIDE 2.5 MG/0.5ML ~~LOC~~ SOAJ: 2.5000 mg | SUBCUTANEOUS | 2 refills | Status: DC

## 2023-12-14 NOTE — Telephone Encounter (Signed)
Spoke to patient and he needed the [facility] sent to [facility] instead.  RX sent in and they will order it for him.  Dm/cma

## 2023-12-14 NOTE — Telephone Encounter (Signed)
Patient called and states he was prescribed tirzepatide [facility]) 2.5 MG/0.5ML Pen  but unfortunately he can not find anyone in the city that has it. He would like to know can he be bumped up to 5. Please advise

## 2023-12-14 NOTE — Telephone Encounter (Signed)
Lft VM to rtn call. Dm/cma  

## 2023-12-14 NOTE — Telephone Encounter (Signed)
Patient returning phone call from cma. Please call.

## 2023-12-15 ENCOUNTER — Encounter: Payer: Self-pay | Admitting: Family Medicine

## 2023-12-15 DIAGNOSIS — N521 Erectile dysfunction due to diseases classified elsewhere: Secondary | ICD-10-CM

## 2023-12-16 ENCOUNTER — Ambulatory Visit: Payer: BC Managed Care – PPO | Admitting: Orthopedic Surgery

## 2023-12-16 ENCOUNTER — Encounter: Payer: Self-pay | Admitting: Orthopedic Surgery

## 2023-12-16 VITALS — BP 140/82 | HR 92 | Ht 71.0 in | Wt 319.0 lb

## 2023-12-16 DIAGNOSIS — Z9889 Other specified postprocedural states: Secondary | ICD-10-CM | POA: Diagnosis not present

## 2023-12-16 DIAGNOSIS — M25512 Pain in left shoulder: Secondary | ICD-10-CM

## 2023-12-16 DIAGNOSIS — G8929 Other chronic pain: Secondary | ICD-10-CM

## 2023-12-16 DIAGNOSIS — M1712 Unilateral primary osteoarthritis, left knee: Secondary | ICD-10-CM | POA: Diagnosis not present

## 2023-12-16 NOTE — Progress Notes (Signed)
VISIT TYPE: FOLLOW UP   Chief Complaint  Patient presents with   Routine Post Op    L knee would like to discuss gel injections again.     Encounter Diagnoses  Name Primary?   S/P left knee arthroscopy 04/28/23 medial meniscal repair Yes   Primary osteoarthritis of left knee    Chronic left shoulder pain    Acute pain of left shoulder    PROCEDURE:  Procedure(s): KNEE ARTHROSCOPY WITH PARTIAL MEDIAL MENISCECTOMY (Left) KNEE ARTHROSCOPY WITH MEDIAL MENISCAL REPAIR (Left) CHONDROPLASTY PATELLA AND FEMUR (Left)   Assessment and Plan: Gary Grimes is 62 years old he status post arthroscopy with partial medial meniscectomy and partial meniscal repair but had significant arthritis in the knee.  Current symptoms include a sensation of popping dull aching pain left knee  Review of systems back pain stable.  Left thumb requiring surgery soon  We will proceed with hyaluronic acid injections in the first week of February  And he also complained of pain in his left shoulder status post left shoulder by Dr. Devonne Doughty about 64 years ago.  He said he had a biceps issue and clavicular issue  He complains of pain at 90 degrees of flexion with internal rotation all in the anterior aspect of his arm.  On examination his tenderness was over the biceps worse with supination improved with some pronation no weakness in abduction or flexion  Internal rotation limited to L3 but this was equal compared to the opposite shoulder  Plan: Lookup operative note to see if I can get an idea of what kind of surgery    BP (!) 140/82   Pulse 92   Ht 5\' 11"  (1.803 m)   Wt (!) 319 lb (144.7 kg)   BMI 44.49 kg/m  Ortho Exam  Gait seems reasonable  Knee flexion is only about 115 degrees PAINFUL  This is causing some functional activity deficits such as pain swelling and shower.  He can't get his foot up high enough to wash his feet     A/P Encounter Diagnoses  Name Primary?   S/P left knee arthroscopy 04/28/23  medial meniscal repair Yes   Primary osteoarthritis of left knee    Chronic left shoulder pain    Acute pain of left shoulder     No orders of the defined types were placed in this encounter.   Prior operative notes were obtained  NAME:  Gary Grimes, Gary Grimes               ACCOUNT NO.:  [id number]    MEDICAL RECORD NO.:  [id number]          PATIENT TYPE:  OIB    LOCATION:  5040                         FACILITY:  [facility]    PHYSICIAN:  Almedia Balls. Ranell Patrick, M.D. DATE OF BIRTH:  [birth year]    DATE OF ADMISSION:  08/08/2007  DATE OF DISCHARGE:                                DISCHARGE SUMMARY    ADMISSION DIAGNOSIS:  Left shoulder pain and impingement.    DISCHARGE DIAGNOSES:  1. Left shoulder impingement.  2. Partial-thickness rotator cuff tear.  3. Superior labrum from anterior to posterior lesion.    BRIEF HISTORY:  The patient is a 62 year old male with worsening  pain to  the left shoulder.  The patient has elected to have a left shoulder  arthroscopy by Dr. Almedia Balls. Norris.    PROCEDURES:  1. The patient had a left shoulder arthroscopy.  2. Biceps tenesmus and debridement of a partial-thickness rotator cuff      tear on August 08, 2007.    ASSISTANT:  Donnie Coffin. Dixon, P.A.-C.    ANESTHESIA:  General anesthesia was used with an interscalene block.    COMPLICATIONS:  None.    ESTIMATED BLOOD LOSS:  Minimal.    HOSPITAL COURSE:  The patient was admitted on August 08, 2007, for the  above stated procedure which is tolerated well after adequate post  anesthesia care and he was transferred up to 5000.  On postoperative day  number one, the patient complained about some moderate pain to that left  shoulder but, otherwise, was doing okay.  He had a fairly restful night.  Neurovascularly he was intact.  Sling and dressing were in place.  He  went through gentle range of motion before discharge home.    DISCHARGE PLAN:  The patient will be discharged home on August 09, 2007.     CONDITION ON DISCHARGE:  Stable.    DIET:  Regular.    The patient had no known drug allergies.    DISCHARGE MEDICATIONS:  1. Wellbutrin 150 mg p.o. q.d.  2. Robaxin 500 mg p.o. q.6h.  3. Trazodone 75 mg p.o. q. h.s.  4. Percocet one to two tabs q.4-6h. p.r.n. pain and the dose is 5/325.          Thomas B. Durwin Nora, P.A.          Almedia Balls. Ranell Patrick, M.D.  Electronically Signed      TBD/MEDQ  D:  08/09/2007  T:  08/09/2007  Job:  [id number]  NAME:  Gary Grimes, Gary Grimes               ACCOUNT NO.:  [id number]    MEDICAL RECORD NO.:  [id number]          PATIENT TYPE:  OIB    LOCATION:  5040                         FACILITY:  [facility]    PHYSICIAN:  Almedia Balls. Ranell Patrick, M.D. DATE OF BIRTH:  May 07, 1961    DATE OF PROCEDURE:  08/08/2007  DATE OF DISCHARGE:                                OPERATIVE REPORT    PREOPERATIVE DIAGNOSIS:  Left shoulder impingement, osteoarthritis and  acromioclavicular joint arthritis.    POSTOPERATIVE DIAGNOSIS:  Left shoulder impingement, partial-thickness  rotator cuff tear, superior labral tear anterior to posterior,  acromioclavicular joint degenerative joint disease, glenohumeral joint  degenerative joint disease.    PROCEDURE PERFORMED:  Left shoulder examination under anesthesia,    shoulder arthroscopy with extensive intra-articular debridement  -including debridement of partial-thickness rotator cuff tear  -labral debridement including SLAP debridement as well as debridement of   - posterior inferior labral tear,    -arthroscopic biceps tenotomy,   - arthroscopic subacromial decompression,   -mini open biceps tenodesis in the groove, followed by   - open distal clavicle excision.    ASSISTANT SURGEON:  Almedia Balls. Ranell Patrick, M.D.    ASSISTANT SURGEON:  Donnie Coffin. Durwin Nora, P.A.    ANESTHESIA:  General  anesthesia plus interscalene block anesthesia was  used.    ESTIMATED BLOOD LOSS:  Minimal.    FLUID REPLACEMENT:  1200 mL crystalloid.    SURGICAL  COUNTS:  Instrument counts were correct.    COMPLICATIONS:  None.    MEDICATIONS UTILIZED:  Perioperative antibiotics were given.    INDICATIONS FOR PROCEDURE:  The patient is a 62 year old male with a  history of worsening left shoulder pain secondary to osteoarthritis in  the [facility] joint as well as some impingement findings.  The patient has  failed conservative management consisting of multiple injections,  activity modifications, antiinflammatories, and the patient presents now  for operative treatment given his continued debilitating pain and  impaired function.  Informed consent was obtained.    DESCRIPTION OF PROCEDURE:  After an adequate level of anesthesia was  achieved, the patient was positioned in the modified beach-chair  position.  All neurovascular structures were padded appropriately.  The  left shoulder was sterilely prepped and draped in the usual manner.  The  patient was noted on EUA to have normal passive range of motion of the  shoulder with no instability, no undue tightness.  Three standard  arthroscopic portals were created in the usual manner, including  infiltration of the skin with 0.25% Marcaine with epinephrine, followed  by incision with the #11 blade scalpel and introduction of a cannula  into the joint using blunt obturators.    Diagnostic arthroscopy revealed torn superior labrum, anterior to  posterior, with extensive tearing in the biceps.  The biceps was  released using basket forceps and a motorized shaver.  A thorough labral  debridement was performed using basket forceps and the motorized shaver.  The patient had tearing into the anterior inferior labrum and extensive  posterior labral tearing, all debrided using the motorized shaver and  basket forceps.  The middle glenohumeral ligament was normal.  The  subscapularis was normal.  The rotator cuff had a tear which was an  undersurface articular-sided tear of less than 25% of the thickness of   the tendon.  We performed debridement of that tendon tear, including  scuffing up the rotator cuff footprint to encourage healing.  The  posterior portion of the cuff was normal.    The glenohumeral articular cartilage showed grade III and grade IV  changes with some complete eburnated bone on the posterior inferior  portion of the glenoid, adjacent a degenerative tear in the posterior  inferior labrum which was debrided using basket forceps and the  motorized shaver.  Following completion of the labral debridement,  biceps tenotomy and the debridement of the partial-thickness rotator  cuff tear, we placed the scope in the subacromial space.  Thorough  bursectomy and acromioplasty were performed, including release of the CA  ligament.  The rotator cuff appeared pristine from the bursal surface.  We then performed a saber incision overlying the AC joint.  Dissection  was carried sharply down through subcutaneous tissues.  The  deltotrapezius fascia was split along with the distal clavicle.  Subperiosteal dissection of the distal clavicle was performed followed  by excision of the distal 5 mm of the clavicle using the oscillating  saw.  Thorough irrigation of the AC interval was performed, followed by  application of bone wax in clavicle.  We then repaired the  deltotrapezius fascia using interrupted figure-of-eight 0 Vicryl suture;  followed by 2-0 Vicryl for the subcutaneous closure; and 4-0 Monocryl  for the skin.    Next the deltoid  was split in its raphe between the anterolateral heads.  Dissection was carried sharply down through subcutaneous tissues to the  biceps groove where we identified the soft tissue overlying the biceps  groove and incised that using Bovie electrocautery, delivered the  tendon, whipstitched the biceps tendon using FiberWire suture; and then  tenodesed at mid tension with the elbow at 90 degrees using a 7 x 23 mm  Arthrex arthrodesis screw, gaining purchase  on the tendon.  Next we went  ahead and thoroughly irrigated and then closed the deltoid to itself  with interrupted 0 Vicryl suture in the simple fashion, followed by 2-0  Vicryl subcutaneous closure and 4-0 Monocryl for the skin.  Steri-Strips  were applied, followed by a sterile dressing.  The patient tolerated the  surgery well.          Almedia Balls. Ranell Patrick, M.D.  Electronically Signed        SRN/MEDQ  D:  08/08/2007  T:  08/08/2007  Job:  [id number]

## 2023-12-17 ENCOUNTER — Telehealth: Payer: Self-pay

## 2023-12-17 NOTE — Telephone Encounter (Signed)
Copied from CRM [id number]. Topic: Clinical - Medication Question >> Dec 17, 2023  2:25 PM Gary Grimes wrote: Reason for CRM: pt  calling with questions regarding he medication tirzepatide [facility]) 2.5 MG/0.5ML pen, would like to know if its ok to take with his insulin.  Is the medication going  to increase his blood sugar. Please call pt back at [phone]    Left VM to rtn call. Dm/cma    Spoke to provider and he may take both the [facility] and insulin he will just need to watch his blood sugars.

## 2023-12-17 NOTE — Telephone Encounter (Signed)
Patient notified VIA.  No further questions. Dm/cma

## 2023-12-20 ENCOUNTER — Encounter: Payer: Self-pay | Admitting: Family Medicine

## 2023-12-20 ENCOUNTER — Ambulatory Visit: Payer: BC Managed Care – PPO | Admitting: Family Medicine

## 2023-12-20 VITALS — BP 126/74 | HR 76 | Temp 98.4°F | Ht 71.0 in | Wt 318.4 lb

## 2023-12-20 DIAGNOSIS — J4520 Mild intermittent asthma, uncomplicated: Secondary | ICD-10-CM | POA: Diagnosis not present

## 2023-12-20 DIAGNOSIS — B349 Viral infection, unspecified: Secondary | ICD-10-CM | POA: Diagnosis not present

## 2023-12-20 DIAGNOSIS — U071 COVID-19: Secondary | ICD-10-CM | POA: Diagnosis not present

## 2023-12-20 LAB — POC COVID19 BINAXNOW: SARS Coronavirus 2 Ag: POSITIVE — AB

## 2023-12-20 LAB — POCT INFLUENZA A/B
Influenza A, POC: NEGATIVE
Influenza B, POC: NEGATIVE

## 2023-12-20 MED ORDER — NIRMATRELVIR/RITONAVIR (PAXLOVID)TABLET: 3.0000 | ORAL_TABLET | Freq: Two times a day (BID) | ORAL | 0 refills | Status: DC

## 2023-12-20 MED ORDER — ALBUTEROL SULFATE (2.5 MG/3ML) 0.083% IN NEBU: 2.5000 mg | INHALATION_SOLUTION | Freq: Four times a day (QID) | RESPIRATORY_TRACT | 1 refills | Status: DC | PRN

## 2023-12-20 MED ORDER — NIRMATRELVIR/RITONAVIR (PAXLOVID)TABLET: 3.0000 | ORAL_TABLET | Freq: Two times a day (BID) | ORAL | 0 refills | Status: AC

## 2023-12-20 NOTE — Progress Notes (Signed)
Established Patient Office Visit   Subjective:  Patient ID: Gary Grimes, male    DOB: [birth year]  Age: 62 y.o. MRN: [id number]  Chief Complaint  Patient presents with   Cough    Cough with sinus pressure and chills.     Cough Pertinent negatives include no eye redness, myalgias, rash, shortness of breath or wheezing.   Encounter Diagnoses  Name Primary?   COVID Yes   Viral syndrome    Mild intermittent reactive airway disease without complication    3-day history of severe head congestion postnasal drip and a dry cough.  Denies fevers but has been chilled.  History of reactive airway disease with respiratory illness.  No personal history of asthma and does not smoke.  Not wheezing yet.   Review of Systems  Constitutional: Negative.   HENT:  Positive for congestion.   Eyes:  Negative for blurred vision, discharge and redness.  Respiratory:  Positive for cough. Negative for sputum production, shortness of breath and wheezing.   Cardiovascular: Negative.   Gastrointestinal:  Negative for abdominal pain.  Genitourinary: Negative.   Musculoskeletal: Negative.  Negative for myalgias.  Skin:  Negative for rash.  Neurological:  Negative for tingling, loss of consciousness and weakness.  Endo/Heme/Allergies:  Negative for polydipsia.     Current Outpatient Medications:    albuterol (PROVENTIL) (2.5 MG/3ML) 0.083% nebulizer solution, Take 3 mLs (2.5 mg total) by nebulization every 6 (six) hours as needed for wheezing or shortness of breath., Disp: 150 mL, Rfl: 1   Ascorbic Acid (VITAMIN C) 1000 MG tablet, Take 1,000 mg by mouth daily., Disp: , Rfl:    aspirin EC 81 MG tablet, Take 1 tablet (81 mg total) by mouth daily. Swallow whole., Disp: 90 tablet, Rfl: 3   atomoxetine (STRATTERA) 40 MG capsule, Take 1 capsule (40 mg total) by mouth 2 (two) times daily with a meal., Disp: 180 capsule, Rfl: 3   atorvastatin (LIPITOR) 10 MG tablet, TAKE 1 TABLET BY MOUTH EVERY DAY, Disp: 90  tablet, Rfl: 2   b complex vitamins capsule, Take 2 capsules by mouth daily., Disp: , Rfl:    cariprazine (VRAYLAR) 3 MG capsule, Take 3 mg by mouth daily., Disp: , Rfl:    clonazePAM (KLONOPIN) 2 MG tablet, Take 1 tablet (2 mg total) by mouth daily., Disp: 30 tablet, Rfl: 1   Continuous Glucose Receiver (FREESTYLE LIBRE 2 READER) DEVI, Use as advised, Disp: 1 each, Rfl: 0   Continuous Glucose Sensor (FREESTYLE LIBRE 2 SENSOR) MISC, Use 1 sensor every 2 weeks, Disp: 6 each, Rfl: 3   Doxepin HCl 6 MG TABS, TAKE ONE TABLET BY MOUTH EVERY NIGHT AT BEDTIME, Disp: 30 tablet, Rfl: 1   doxycycline (VIBRA-TABS) 100 MG tablet, Take 1 tablet (100 mg total) by mouth 2 (two) times daily., Disp: 20 tablet, Rfl: 0   fenofibrate (TRICOR) 145 MG tablet, Take 1 tablet (145 mg total) by mouth daily., Disp: 90 tablet, Rfl: 3   hydrOXYzine (ATARAX) 25 MG tablet, Take 25 mg by mouth daily as needed for anxiety or itching., Disp: , Rfl:    ibuprofen (ADVIL) 800 MG tablet, Take 1 tablet (800 mg total) by mouth every 8 (eight) hours as needed., Disp: 90 tablet, Rfl: 1   insulin degludec (TRESIBA FLEXTOUCH) 200 UNIT/ML FlexTouch Pen, Inject 30 Units into the skin daily. 20 up to 30 units daily, Disp: 9 mL, Rfl: 3   insulin regular human CONCENTRATED (HUMULIN R U-500 KWIKPEN) 500 UNIT/ML KwikPen,  INJECT UP TO 180 units a day under skin as advised, Disp: 8 mL, Rfl: 3   lamoTRIgine (LAMICTAL) 100 MG tablet, Take 25 mg by mouth at bedtime., Disp: , Rfl:    loperamide (IMODIUM A-D) 2 MG tablet, Take 1 tablet (2 mg total) by mouth 4 (four) times daily as needed for diarrhea or loose stools., Disp: 30 tablet, Rfl: 0   methocarbamol (ROBAXIN) 750 MG tablet, Take 1 tablet (750 mg total) by mouth 4 (four) times daily., Disp: 60 tablet, Rfl: 2   methylPREDNISolone (MEDROL DOSEPAK) 4 MG TBPK tablet, Take per package instructions., Disp: 21 tablet, Rfl: 0   olmesartan (BENICAR) 40 MG tablet, Take 1 tablet (40 mg total) by mouth daily.,  Disp: 90 tablet, Rfl: 3   pregabalin (LYRICA) 200 MG capsule, Take 1 capsule (200 mg total) by mouth every morning AND 2 capsules (400 mg total) every evening., Disp: 270 capsule, Rfl: 1   SURE COMFORT PEN NEEDLES 32G X 4 MM MISC, USE DAILY, Disp: 100 each, Rfl: 3   Suvorexant (BELSOMRA) 20 MG TABS, Take 1 tablet (20 mg total) by mouth at bedtime., Disp: 30 tablet, Rfl: 1   tadalafil (CIALIS) 10 MG tablet, Take 1 tablet (10 mg total) by mouth daily as needed for erectile dysfunction., Disp: 10 tablet, Rfl: 0   tirzepatide (MOUNJARO) 2.5 MG/0.5ML Pen, Inject 2.5 mg into the skin once a week., Disp: 2 mL, Rfl: 2   tirzepatide (MOUNJARO) 5 MG/0.5ML Pen, Inject 5 mg into the skin once a week., Disp: 2 mL, Rfl: 0   traZODone (DESYREL) 100 MG tablet, Take 2 tablets (200 mg total) by mouth at bedtime., Disp: 180 tablet, Rfl: 3   nirmatrelvir/ritonavir (PAXLOVID) 20 x 150 MG & 10 x 100MG  TABS, Take 3 tablets by mouth 2 (two) times daily for 5 days. (Take nirmatrelvir 150 mg two tablets twice daily for 5 days and ritonavir 100 mg one tablet twice daily for 5 days) Patient GFR is 60, Disp: 30 tablet, Rfl: 0   promethazine-codeine (PHENERGAN WITH CODEINE) 6.25-10 MG/5ML syrup, Take 10 mLs by mouth every 6 (six) hours as needed for cough. (Patient not taking: Reported on 12/20/2023), Disp: 120 mL, Rfl: 0   Objective:     BP 126/74 (Cuff Size: Large)   Pulse 76   Temp 98.4 F (36.9 C)   Ht 5\' 11"  (1.803 m)   Wt (!) 318 lb 6.4 oz (144.4 kg)   SpO2 96%   BMI 44.41 kg/m  Wt Readings from Last 3 Encounters:  12/20/23 (!) 318 lb 6.4 oz (144.4 kg)  12/16/23 (!) 319 lb (144.7 kg)  12/10/23 (!) 319 lb 6.4 oz (144.9 kg)      Physical Exam Constitutional:      General: He is not in acute distress.    Appearance: Normal appearance. He is not ill-appearing, toxic-appearing or diaphoretic.  HENT:     Head: Normocephalic and atraumatic.     Right Ear: External ear normal.     Left Ear: External ear normal.   Eyes:     General: No scleral icterus.       Right eye: No discharge.        Left eye: No discharge.     Extraocular Movements: Extraocular movements intact.     Conjunctiva/sclera: Conjunctivae normal.     Pupils: Pupils are equal, round, and reactive to light.  Cardiovascular:     Rate and Rhythm: Normal rate and regular rhythm.  Pulmonary:  Effort: Pulmonary effort is normal. No respiratory distress.     Breath sounds: Normal breath sounds. No wheezing, rhonchi or rales.  Abdominal:     General: Bowel sounds are normal.  Musculoskeletal:     Cervical back: No rigidity or tenderness.  Lymphadenopathy:     Cervical: No cervical adenopathy.  Skin:    General: Skin is warm and dry.  Neurological:     Mental Status: He is alert and oriented to person, place, and time.  Psychiatric:        Mood and Affect: Mood normal.        Behavior: Behavior normal.      No results found for any visits on 12/20/23.    The 10-year ASCVD risk score (Arnett DK, et al., 2019) is: 21.9%    Assessment & Plan:   COVID -     POC COVID-19 BinaxNow -     nirmatrelvir/ritonavir; Take 3 tablets by mouth 2 (two) times daily for 5 days. (Take nirmatrelvir 150 mg two tablets twice daily for 5 days and ritonavir 100 mg one tablet twice daily for 5 days) Patient GFR is 60  Dispense: 30 tablet; Refill: 0  Viral syndrome -     POC COVID-19 BinaxNow -     POCT Influenza A/B  Mild intermittent reactive airway disease without complication -     Albuterol Sulfate; Take 3 mLs (2.5 mg total) by nebulization every 6 (six) hours as needed for wheezing or shortness of breath.  Dispense: 150 mL; Refill: 1    Return Hold atorvastatin, for Schedule follow-up with Dr. Veto Kemps for next week..  With multiple comorbidities, will start Paxlovid.  Patient requests refill on albuterol nebs that tend to be more helpful when he is wheezing with an illness.  No wheezing or respiratory distress today.  Use albuterol nebs  up to every 6 hours for wheezing and/or cough.  Hold atorvastatin.  Mliss Sax, MD

## 2023-12-21 ENCOUNTER — Ambulatory Visit: Payer: BC Managed Care – PPO | Admitting: Family Medicine

## 2023-12-21 ENCOUNTER — Ambulatory Visit: Payer: Self-pay | Admitting: Family Medicine

## 2023-12-21 ENCOUNTER — Encounter: Payer: Self-pay | Admitting: Family Medicine

## 2023-12-21 VITALS — BP 124/84 | HR 87 | Temp 98.3°F | Ht 71.0 in | Wt 314.4 lb

## 2023-12-21 DIAGNOSIS — J029 Acute pharyngitis, unspecified: Secondary | ICD-10-CM | POA: Diagnosis not present

## 2023-12-21 DIAGNOSIS — U071 COVID-19: Secondary | ICD-10-CM | POA: Diagnosis not present

## 2023-12-21 DIAGNOSIS — R5381 Other malaise: Secondary | ICD-10-CM

## 2023-12-21 LAB — POCT RAPID STREP A (OFFICE): Rapid Strep A Screen: NEGATIVE

## 2023-12-21 MED ORDER — PROMETHAZINE-DM 6.25-15 MG/5ML PO SYRP: 5.0000 mL | ORAL_SOLUTION | Freq: Four times a day (QID) | ORAL | 0 refills | Status: DC | PRN

## 2023-12-21 NOTE — Progress Notes (Signed)
Established Patient Office Visit   Subjective:  Patient ID: Gary Grimes, male    DOB: 09-May-1961  Age: 62 y.o. MRN: [id number]  Chief Complaint  Patient presents with   Sore Throat    Sore throat x 1 day.     Sore Throat  Associated symptoms include shortness of breath. Pertinent negatives include no abdominal pain.   Encounter Diagnoses  Name Primary?   Sore throat Yes   COVID    Malaise    Since yesterday has developed extreme malaise, sore throat and shortness of breath.  He was able to start the Paxlovid but has not used his nebs as of yet.   Review of Systems  Constitutional:  Positive for malaise/fatigue.  HENT:  Positive for sore throat.   Eyes:  Negative for blurred vision, discharge and redness.  Respiratory:  Positive for shortness of breath.   Cardiovascular: Negative.   Gastrointestinal:  Negative for abdominal pain.  Genitourinary: Negative.   Musculoskeletal: Negative.  Negative for myalgias.  Skin:  Negative for rash.  Neurological:  Negative for tingling, loss of consciousness and weakness.  Endo/Heme/Allergies:  Negative for polydipsia.     Current Outpatient Medications:    albuterol (PROVENTIL) (2.5 MG/3ML) 0.083% nebulizer solution, Take 3 mLs (2.5 mg total) by nebulization every 6 (six) hours as needed for wheezing or shortness of breath., Disp: 150 mL, Rfl: 1   Ascorbic Acid (VITAMIN C) 1000 MG tablet, Take 1,000 mg by mouth daily., Disp: , Rfl:    aspirin EC 81 MG tablet, Take 1 tablet (81 mg total) by mouth daily. Swallow whole., Disp: 90 tablet, Rfl: 3   atomoxetine (STRATTERA) 40 MG capsule, Take 1 capsule (40 mg total) by mouth 2 (two) times daily with a meal., Disp: 180 capsule, Rfl: 3   atorvastatin (LIPITOR) 10 MG tablet, TAKE 1 TABLET BY MOUTH EVERY DAY, Disp: 90 tablet, Rfl: 2   b complex vitamins capsule, Take 2 capsules by mouth daily., Disp: , Rfl:    cariprazine (VRAYLAR) 3 MG capsule, Take 3 mg by mouth daily., Disp: , Rfl:     clonazePAM (KLONOPIN) 2 MG tablet, Take 1 tablet (2 mg total) by mouth daily., Disp: 30 tablet, Rfl: 1   Continuous Glucose Receiver (FREESTYLE LIBRE 2 READER) DEVI, Use as advised, Disp: 1 each, Rfl: 0   Continuous Glucose Sensor (FREESTYLE LIBRE 2 SENSOR) MISC, Use 1 sensor every 2 weeks, Disp: 6 each, Rfl: 3   Doxepin HCl 6 MG TABS, TAKE ONE TABLET BY MOUTH EVERY NIGHT AT BEDTIME, Disp: 30 tablet, Rfl: 1   doxycycline (VIBRA-TABS) 100 MG tablet, Take 1 tablet (100 mg total) by mouth 2 (two) times daily., Disp: 20 tablet, Rfl: 0   fenofibrate (TRICOR) 145 MG tablet, Take 1 tablet (145 mg total) by mouth daily., Disp: 90 tablet, Rfl: 3   hydrOXYzine (ATARAX) 25 MG tablet, Take 25 mg by mouth daily as needed for anxiety or itching., Disp: , Rfl:    ibuprofen (ADVIL) 800 MG tablet, Take 1 tablet (800 mg total) by mouth every 8 (eight) hours as needed., Disp: 90 tablet, Rfl: 1   insulin degludec (TRESIBA FLEXTOUCH) 200 UNIT/ML FlexTouch Pen, Inject 30 Units into the skin daily. 20 up to 30 units daily, Disp: 9 mL, Rfl: 3   insulin regular human CONCENTRATED (HUMULIN R U-500 KWIKPEN) 500 UNIT/ML KwikPen, INJECT UP TO 180 units a day under skin as advised, Disp: 8 mL, Rfl: 3   lamoTRIgine (LAMICTAL) 100 MG  tablet, Take 25 mg by mouth at bedtime., Disp: , Rfl:    loperamide (IMODIUM A-D) 2 MG tablet, Take 1 tablet (2 mg total) by mouth 4 (four) times daily as needed for diarrhea or loose stools., Disp: 30 tablet, Rfl: 0   methocarbamol (ROBAXIN) 750 MG tablet, Take 1 tablet (750 mg total) by mouth 4 (four) times daily., Disp: 60 tablet, Rfl: 2   methylPREDNISolone (MEDROL DOSEPAK) 4 MG TBPK tablet, Take per package instructions., Disp: 21 tablet, Rfl: 0   nirmatrelvir/ritonavir (PAXLOVID) 20 x 150 MG & 10 x 100MG  TABS, Take 3 tablets by mouth 2 (two) times daily for 5 days. (Take nirmatrelvir 150 mg two tablets twice daily for 5 days and ritonavir 100 mg one tablet twice daily for 5 days) Patient GFR is 60,  Disp: 30 tablet, Rfl: 0   olmesartan (BENICAR) 40 MG tablet, Take 1 tablet (40 mg total) by mouth daily., Disp: 90 tablet, Rfl: 3   pregabalin (LYRICA) 200 MG capsule, Take 1 capsule (200 mg total) by mouth every morning AND 2 capsules (400 mg total) every evening., Disp: 270 capsule, Rfl: 1   SURE COMFORT PEN NEEDLES 32G X 4 MM MISC, USE DAILY, Disp: 100 each, Rfl: 3   Suvorexant (BELSOMRA) 20 MG TABS, Take 1 tablet (20 mg total) by mouth at bedtime., Disp: 30 tablet, Rfl: 1   tadalafil (CIALIS) 10 MG tablet, Take 1 tablet (10 mg total) by mouth daily as needed for erectile dysfunction., Disp: 10 tablet, Rfl: 0   tirzepatide (MOUNJARO) 2.5 MG/0.5ML Pen, Inject 2.5 mg into the skin once a week., Disp: 2 mL, Rfl: 2   tirzepatide (MOUNJARO) 5 MG/0.5ML Pen, Inject 5 mg into the skin once a week., Disp: 2 mL, Rfl: 0   traZODone (DESYREL) 100 MG tablet, Take 2 tablets (200 mg total) by mouth at bedtime., Disp: 180 tablet, Rfl: 3   promethazine-dextromethorphan (PROMETHAZINE-DM) 6.25-15 MG/5ML syrup, Take 5 mLs by mouth 4 (four) times daily as needed for cough., Disp: 118 mL, Rfl: 0   Objective:     BP 124/84   Pulse 87   Temp 98.3 F (36.8 C)   Ht 5\' 11"  (1.803 m)   Wt (!) 314 lb 6.4 oz (142.6 kg)   SpO2 97%   BMI 43.85 kg/m    Physical Exam Constitutional:      General: He is not in acute distress.    Appearance: Normal appearance. He is not ill-appearing, toxic-appearing or diaphoretic.  HENT:     Head: Normocephalic and atraumatic.     Right Ear: External ear normal.     Left Ear: External ear normal.  Eyes:     General: No scleral icterus.       Right eye: No discharge.        Left eye: No discharge.     Extraocular Movements: Extraocular movements intact.     Conjunctiva/sclera: Conjunctivae normal.  Cardiovascular:     Rate and Rhythm: Normal rate and regular rhythm.  Pulmonary:     Effort: Pulmonary effort is normal. No respiratory distress.     Breath sounds: No  wheezing, rhonchi or rales.  Skin:    General: Skin is warm and dry.  Neurological:     Mental Status: He is alert and oriented to person, place, and time.  Psychiatric:        Mood and Affect: Mood normal.        Behavior: Behavior normal.      Results  for orders placed or performed in visit on 12/21/23  POCT rapid strep A  Result Value Ref Range   Rapid Strep A Screen Negative Negative      The 10-year ASCVD risk score (Arnett DK, et al., 2019) is: 21.3%    Assessment & Plan:   Sore throat -     POCT rapid strep A  COVID -     Promethazine-DM; Take 5 mLs by mouth 4 (four) times daily as needed for cough.  Dispense: 118 mL; Refill: 0  Malaise    Return Follow-up next week as planned if not improving.  Salt water gargles, Advil or Aleve, Cepacol lozenges.  Please use nebulizers as needed.  Continue Paxlovid.  Explained that malaise, sore throat and rhinorrhea are all part of the COVID syndrome.  This should improve over the next 2 or 3 days.  Emergency room with any difficulty breathing.  No wheezing on exam today.  He continues to sat well.  Mliss Sax, MD

## 2023-12-21 NOTE — Telephone Encounter (Signed)
   Chief Complaint: sore throat Symptoms: sore throat, cough, difficulty swallowing Pertinent Negatives: Patient denies fever, rash Disposition: [] ED /[] Urgent Care (no appt availability in office) / [x] Appointment(In office/virtual)/ []  [place] [facility]/ [] Home Care/ [] Refused Recommended Disposition /[] [place] [facility]/ []  Follow-up with PCP Additional Notes: Patient reports he woke up with a severe sore throat this morning which is making it hard to swallow anything other than liquids. Patient reports he was diagnosed with covid yesterday, but feels his sore throat may also be strep throat, as he has had this in the past. Per protocol, this RN scheduled appt 12/24. This was scheduled as video virtual visit per patient request as he is unable to come into office today. Patient verbalized understanding.   Copied from CRM [id number]. Topic: Clinical - Red Word Triage >> Dec 21, 2023  8:46 AM Pascal Lux wrote: Red Word that prompted transfer to Nurse Triage: Patient stated he saw his doctor yesterday and was diagnosed with COVID over the night he developed strep throat would like some medication for the pain. Reason for Disposition  SEVERE (e.g., excruciating) throat pain  Answer Assessment - Initial Assessment Questions 1. ONSET: "When did the throat start hurting?" (Hours or days ago)      This morning 2. SEVERITY: "How bad is the sore throat?" (Scale 1-10; mild, moderate or severe)   - MILD (1-3):  Doesn't interfere with eating or normal activities.   - MODERATE (4-7): Interferes with eating some solids and normal activities.   - SEVERE (8-10):  Excruciating pain, interferes with most normal activities.   - SEVERE WITH DYSPHAGIA (10): Can't swallow liquids, drooling.     severe 3. STREP EXPOSURE: "Has there been any exposure to strep within the past week?" If Yes, ask: "What type of contact occurred?"      Not sure 4.  VIRAL SYMPTOMS: "Are there any symptoms of a cold, such as a  runny nose, cough, hoarse voice or red eyes?"      Covid + diagnoses yesterday, cough 5. FEVER: "Do you have a fever?" If Yes, ask: "What is your temperature, how was it measured, and when did it start?"     unsure 6. PUS ON THE TONSILS: "Is there pus on the tonsils in the back of your throat?"     unsure 7. OTHER SYMPTOMS: "Do you have any other symptoms?" (e.g., difficulty breathing, headache, rash)     Coughing  Protocols used: Sore Throat-A-AH

## 2023-12-24 ENCOUNTER — Ambulatory Visit: Payer: Self-pay | Admitting: Family Medicine

## 2023-12-24 ENCOUNTER — Other Ambulatory Visit: Payer: Self-pay

## 2023-12-24 DIAGNOSIS — U071 COVID-19: Secondary | ICD-10-CM

## 2023-12-24 MED ORDER — PROMETHAZINE-DM 6.25-15 MG/5ML PO SYRP: 5.0000 mL | ORAL_SOLUTION | Freq: Four times a day (QID) | ORAL | 0 refills | Status: DC | PRN

## 2023-12-24 NOTE — Telephone Encounter (Signed)
Spoke to patient and advised of recommendations.  Also advised that we sent the refill of the cough medication to the pharmacy for him..  no further questions.  Dm/cma

## 2023-12-24 NOTE — Telephone Encounter (Signed)
Patient was seen by you on 12/20/23 and diagnosed with Covid.  Do you have any recommendations for something for the cough and medication that he can take?   Please review and advise.  Thanks. Dm/cma

## 2023-12-24 NOTE — Telephone Encounter (Signed)
Per provider okay to send refill on cough medication.  Rx sent to pharmacy. Dm/cma

## 2023-12-24 NOTE — Telephone Encounter (Signed)
Called patient, he reports that he went to an UC and there was a 3 hour wait so he left and went home.   He is using his nebulizer with little relief.  Advised him if he gets to feeling worse to please go and get checked out and he stated that he would. Dm/cma

## 2023-12-24 NOTE — Telephone Encounter (Signed)
  Chief Complaint: Pain in chest, breathing difficulty r/t covid diagnosis Symptoms: pain in chest, breathing difficulty, coughing Frequency: constant Disposition: [x] ED /[x] Urgent Care (no appt availability in office) / [] Appointment(In office/virtual)/ []  [place] [facility]/ [] Home Care/ [] Refused Recommended Disposition /[] [place] [facility]/ []  Follow-up with PCP Additional Notes: Patient called in stating he was recently diagnosed with Covid on Monday and is now having pain in his chest and breathing difficulty. Immediately encouraged patient to be seen in Emergency Room due to heavy cough on phone and symptoms he was reporting. Patient stated he absolutely refused to go to Emergency Room due to high wait times. I informed him due to his acuity level he would likely get seen faster than average wait time, but patient still refused. Patient agreed to be seen at [facility]. Patient stated he will go to UC now. Informed patient to call back if he needs a follow up with Dr. Veto Kemps.    Copied from CRM [id number]. Topic: Clinical - Red Word Triage >> Dec 24, 2023 11:37 AM Cassiday T wrote: Red Word that prompted transfer to Nurse Triage: pt has covid and is having trouble breathing and pain in the chest and lungs on both sides Reason for Disposition  Patient sounds very sick or weak to the triager  Protocols used: Breathing Difficulty-A-AH

## 2023-12-24 NOTE — Telephone Encounter (Signed)
Patient called back and is asking if Dr Doreene Burke can call in an antibiotic for him to help clear the chest congestion.    Please review and advise.  Thanks. Dm/cma

## 2023-12-28 ENCOUNTER — Ambulatory Visit: Payer: BC Managed Care – PPO | Admitting: Family Medicine

## 2023-12-28 ENCOUNTER — Encounter: Payer: Self-pay | Admitting: Family Medicine

## 2023-12-28 VITALS — BP 154/80 | HR 93 | Temp 97.2°F | Ht 71.0 in | Wt 322.6 lb

## 2023-12-28 DIAGNOSIS — J40 Bronchitis, not specified as acute or chronic: Secondary | ICD-10-CM | POA: Diagnosis not present

## 2023-12-28 MED ORDER — PREDNISONE 20 MG PO TABS: 40.0000 mg | ORAL_TABLET | Freq: Every day | ORAL | 0 refills | Status: DC

## 2023-12-28 MED ORDER — PROMETHAZINE-CODEINE 6.25-10 MG/5ML PO SYRP: 5.0000 mL | ORAL_SOLUTION | Freq: Four times a day (QID) | ORAL | 0 refills | Status: DC | PRN

## 2023-12-28 NOTE — Assessment & Plan Note (Signed)
 In my experience with this patient, he is at high risk for hospitalization with his respiratory complaints. I will go ahead prescribe a course of steroids. I will provide some cough syrup as well.He should follow-up in 1 week if not improved.
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
## 2023-12-30 ENCOUNTER — Encounter: Payer: Self-pay | Admitting: Pulmonary Disease

## 2023-12-30 NOTE — Telephone Encounter (Signed)
 Patient is completely out of his mediatation belsomra.    Pharmacy: [facility] in [facility]

## 2023-12-31 ENCOUNTER — Other Ambulatory Visit: Payer: Self-pay | Admitting: Pulmonary Disease

## 2023-12-31 ENCOUNTER — Telehealth: Payer: Self-pay

## 2023-12-31 DIAGNOSIS — J40 Bronchitis, not specified as acute or chronic: Secondary | ICD-10-CM

## 2023-12-31 MED ORDER — BELSOMRA 20 MG PO TABS: 20.0000 mg | ORAL_TABLET | Freq: Every evening | ORAL | 1 refills | Status: DC

## 2023-12-31 MED ORDER — DOXYCYCLINE HYCLATE 100 MG PO TABS: 100.0000 mg | ORAL_TABLET | Freq: Two times a day (BID) | ORAL | 0 refills | Status: DC

## 2023-12-31 MED ORDER — PROMETHAZINE-CODEINE 6.25-10 MG/5ML PO SYRP: 5.0000 mL | ORAL_SOLUTION | Freq: Four times a day (QID) | ORAL | 0 refills | Status: DC | PRN

## 2023-12-31 NOTE — Telephone Encounter (Signed)
 Copied from CRM [id number]. Topic: Clinical - Medical Advice >> Dec 31, 2023  9:04 AM Viola FALCON wrote: Reason for CRM: Patient called says he still has a bad cough and to please advise. He's had this cough for 3 weeks  Patient is still having a persistent cough.  He is asking if we can send him in an antibiotic and a refill on his cough medication.   Please review and advise.  Thanks. Dm/cma

## 2023-12-31 NOTE — Addendum Note (Signed)
 Addended by: Loyola Mast on: 12/31/2023 11:57 AM   Modules accepted: Orders

## 2023-12-31 NOTE — Telephone Encounter (Signed)
 Left detailed VM that RX was sent to the pharmacy and advised to call and make an appointment next week. Dm/cma

## 2023-12-31 NOTE — Telephone Encounter (Signed)
 Dr. Neda, Patient is requesting a Belsomra  refill to [facility].  Message routed to Dr. Neda-  Dr. Minus. I am out of belsomra . I do not sleep without this medication. I have tried multiple times to call into your staff to get this medication refilled. I desperately need this filled ASAP please!!

## 2024-01-01 ENCOUNTER — Other Ambulatory Visit: Payer: Self-pay | Admitting: Pulmonary Disease

## 2024-01-03 ENCOUNTER — Telehealth: Payer: Self-pay

## 2024-01-03 ENCOUNTER — Other Ambulatory Visit: Payer: Self-pay | Admitting: Pulmonary Disease

## 2024-01-03 NOTE — Telephone Encounter (Signed)
 Called pharmacy and he picked up last RX on 12/16/23 they can fill the prescription on 01/13/24.  Patient notified VIA phone. Dm/cma

## 2024-01-03 NOTE — Telephone Encounter (Signed)
 Copied from CRM [id number]. Topic: Clinical - Prescription Issue >> Jan 03, 2024  1:44 PM Chantha C wrote: Reason for CRM: Patient is requesting for Belsomra  40mg  (patient suffers from sleep insomnia) to be refill until patient sees sleep specialist in May 2025. Patietnt is out of medications. Please call back [phone].   [facility] - [place] -  [address] [place] [address] Phone:[phone]:[phone]

## 2024-01-04 ENCOUNTER — Ambulatory Visit: Payer: BC Managed Care – PPO | Admitting: Family Medicine

## 2024-01-04 ENCOUNTER — Telehealth: Payer: Self-pay | Admitting: Orthopedic Surgery

## 2024-01-04 ENCOUNTER — Encounter: Payer: Self-pay | Admitting: Family Medicine

## 2024-01-04 VITALS — BP 124/76 | HR 104 | Temp 98.3°F | Ht 71.0 in | Wt 320.0 lb

## 2024-01-04 DIAGNOSIS — J40 Bronchitis, not specified as acute or chronic: Secondary | ICD-10-CM | POA: Diagnosis not present

## 2024-01-04 DIAGNOSIS — U071 COVID-19: Secondary | ICD-10-CM

## 2024-01-04 DIAGNOSIS — F5104 Psychophysiologic insomnia: Secondary | ICD-10-CM | POA: Diagnosis not present

## 2024-01-04 NOTE — Assessment & Plan Note (Signed)
 Resolving. He has some lingering fatigue and brain fog. I explained that these are typical post-COVID symptoms that should resolve with time.

## 2024-01-04 NOTE — Assessment & Plan Note (Signed)
 Improved with steroids and antibiotics. He should finish out the antibiotic course.

## 2024-01-04 NOTE — Telephone Encounter (Signed)
 FYI: Patient left a message yesterday stating he was sick with COVID over the holidays and had to miss a lot of work due to it. He is not able to schedule [facility] surgery right now due to not being able to take off from work. Patient will call back possible in the spring to schedule surgery.

## 2024-01-04 NOTE — Assessment & Plan Note (Signed)
 Reviewed PDMP. Gary Grimes did receive #30 Belsomra  tabs on 12/19. As we discussed it, he decided he must have accidentally thrown out a bubble pack card of 10 tabs, thinking his box was empty. I recommended he continue to work with Dr. Neda regarding his sleep issues.
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
## 2024-01-05 NOTE — Telephone Encounter (Signed)
 No patient wanted to schedule for January after the holidays. But called to let me know the message below instead.

## 2024-01-06 NOTE — Telephone Encounter (Signed)
 Pt is requesting refill for rx. Pt was last seen on 11-04-23, and the lov stated he "was" on this medication and does not state information to continue this. Please advise.

## 2024-01-07 ENCOUNTER — Telehealth: Payer: Self-pay | Admitting: Cardiology

## 2024-01-07 ENCOUNTER — Ambulatory Visit: Payer: Self-pay | Admitting: Family Medicine

## 2024-01-07 ENCOUNTER — Telehealth: Payer: Self-pay

## 2024-01-07 MED ORDER — APIXABAN 5 MG PO TABS: 5.0000 mg | ORAL_TABLET | Freq: Two times a day (BID) | ORAL | 3 refills | Status: DC

## 2024-01-07 NOTE — Telephone Encounter (Signed)
-----   Message from Jennifer SAUNDERS Revankar sent at 01/06/2024  4:58 PM EST ----- Patient has paroxysmal atrial fibrillation.  He needs to be on anticoagulation.  Eliquis  5 mg twice daily.  I have sent a copy of this message to Dr. Inocencio as the patient has been seen by Dr. Inocencio in the past and has had an atrial fibrillation ablation. Jennifer SAUNDERS Crape, MD 01/06/2024 4:57 PM

## 2024-01-07 NOTE — Telephone Encounter (Signed)
 Called patient and he reported that he has no energy and he is sleeping more than normal. He states that he feels drained with no energy. Patient reported no cardiac symptoms and was unable to take his blood pressure. Based on this I recommended that he follow up with his PCP. Patient verbalized understanding and had no further questions at this time.

## 2024-01-07 NOTE — Telephone Encounter (Addendum)
 Chief Complaint: fatigue Symptoms: tired, sleeping 12-14 hours and still feeling tired Frequency: since Christmas Pertinent Negatives: Patient denies fever, denies SOB  Disposition: [] ED /[] Urgent Care (no appt availability in office) / [] Appointment(In office/virtual)/ []  [facility]/ [x] Home Care/ [] Refused Recommended Disposition /[] [facility]/ []  Follow-up with PCP  Additional Notes: pt states that he believes he has long covid was seen in office and given abx, abx will finish on Sunday states that if he is not feeling better mon he will accept being sched with other providers.  Copied from CRM [id number]. Topic: Clinical - Pink Word Triage >> Jan 07, 2024  1:44 PM Robinson H wrote: Reason for Triage: Patient feeling fatigued he wanted an appt with his provider but nothing until the 20th and refused any other provider sooner, patient stated he'll wait a few more days and see if he feels better since he's coming off of having COVID.  Jodie [phone] Reason for Disposition  [1] PERSISTING SYMPTOMS OF COVID-19 AND [2] symptoms SAME AND [3] medical visit for COVID-19 in past 2 weeks  Answer Assessment - Initial Assessment Questions 1. COVID-19 ONSET: When did the symptoms of COVID-19 first start?     christmas 2. DIAGNOSIS CONFIRMATION: How were you diagnosed? (e.g., COVID-19 oral or nasal viral test; COVID-19 antibody test; doctor visit)     test 3. MAIN SYMPTOM:  What is your main concern or symptom right now? (e.g., breathing difficulty, cough, fatigue. loss of smell)     fatigue 4. SYMPTOM ONSET: When did the  fatigue  start?     Since christmas, 12-14 hr days 5. BETTER-SAME-WORSE: Are you getting better, staying the same, or getting worse over the last 1 to 2 weeks?     Not getting better 6. RECENT MEDICAL VISIT: Have you been seen by a healthcare provider (doctor, NP, PA) for these persisting COVID-19 symptoms? If Yes, ask: When were you seen?  (e.g., date)     Was seen in clinic given abx, finishes in 2 days.  7. COUGH: Do you have a cough? If Yes, ask: How bad is the cough?       Denies at this time 8. FEVER: Do you have a fever? If Yes, ask: What is your temperature, how was it measured, and when did it start?     Denies  9. BREATHING DIFFICULTY: Are you having any trouble breathing? If Yes, ask: How bad is your breathing? (e.g., mild, moderate, severe)    - MILD: No SOB at rest, mild SOB with walking, speaks normally in sentences, can lie down, no retractions, pulse < 100.    - MODERATE: SOB at rest, SOB with minimal exertion and prefers to sit, cannot lie down flat, speaks in phrases, mild retractions, audible wheezing, pulse 100-120.    - SEVERE: Very SOB at rest, speaks in single words, struggling to breathe, sitting hunched forward, retractions, pulse > 120.       denies 10. OTHER SYMPTOMS: Do you have any other symptoms?  (e.g., fatigue, headache, muscle pain, weakness)       dnies 11. HIGH RISK DISEASE: Do you have any chronic medical problems? (e.g., asthma, heart or lung disease, weak immune system, obesity, etc.)       Obesity, bronchitis 14. O2 SATURATION MONITOR:  Do you use an oxygen saturation monitor (pulse oximeter) at home? If Yes, ask What is your reading (oxygen level) today? What is your usual oxygen saturation reading? (e.g., 95%)  Does not have one.  Protocols used: Coronavirus (COVID-19) Persisting Symptoms Follow-up Call-[facility]

## 2024-01-07 NOTE — Telephone Encounter (Signed)
 I did send in the prescription as soon as Gary Grimes contacted me regarding the refill  The med rec also stated that I was sent in on 1/3

## 2024-01-07 NOTE — Telephone Encounter (Signed)
 Left vm to return call and MyChart message

## 2024-01-07 NOTE — Telephone Encounter (Signed)
 New Message:      Patient says he has no energy, sleeps all the time. He says it feels like the blood is drained out of his body.

## 2024-01-10 ENCOUNTER — Encounter: Payer: Self-pay | Admitting: Family Medicine

## 2024-01-10 DIAGNOSIS — I480 Paroxysmal atrial fibrillation: Secondary | ICD-10-CM | POA: Insufficient documentation

## 2024-01-10 NOTE — Telephone Encounter (Signed)
 Noted. Dm/cma

## 2024-01-12 ENCOUNTER — Ambulatory Visit: Payer: BC Managed Care – PPO | Admitting: Family Medicine

## 2024-01-12 ENCOUNTER — Other Ambulatory Visit: Payer: Self-pay | Admitting: Family Medicine

## 2024-01-12 DIAGNOSIS — Z794 Long term (current) use of insulin: Secondary | ICD-10-CM

## 2024-01-12 DIAGNOSIS — E1140 Type 2 diabetes mellitus with diabetic neuropathy, unspecified: Secondary | ICD-10-CM

## 2024-01-12 MED ORDER — TIRZEPATIDE 5 MG/0.5ML ~~LOC~~ SOAJ: 5.0000 mg | SUBCUTANEOUS | 0 refills | Status: DC

## 2024-01-12 NOTE — Telephone Encounter (Signed)
 Refill request for  [facility]  Please review and advise.  Thanks. Dm/cma

## 2024-01-12 NOTE — Telephone Encounter (Signed)
 Copied from CRM [id number]. Topic: Clinical - Medication Refill >> Jan 12, 2024  9:20 AM Adonis Hoot wrote: Most Recent Primary Care Visit:  Provider: Graig Lawyer  Department: [facility]  Visit Type: OFFICE VISIT  Date: 01/04/2024  Medication: tirzepatide (MOUNJARO) 5 MG/0.5ML Pen  Has the patient contacted their pharmacy? No (Agent: If no, request that the patient contact the pharmacy for the refill. If patient does not wish to contact the pharmacy document the reason why and proceed with request.) (Agent: If yes, when and what did the pharmacy advise?)  Is this the correct pharmacy for this prescription? Yes If no, delete pharmacy and type the correct one.  This is the patient's preferred pharmacy:   [id number] - [place], [place] - [address] [place] [address] Phone: [phone] Fax: [phone]   Has the prescription been filled recently? No  Is the patient out of the medication? No  Has the patient been seen for an appointment in the last year OR does the patient have an upcoming appointment? Yes  Can we respond through MyChart? Yes  Agent: Please be advised that Rx refills may take up to 3 business days. We ask that you follow-up with your pharmacy.

## 2024-01-16 NOTE — Progress Notes (Unsigned)
  Electrophysiology Office Note:   Date:  01/17/2024  ID:  Gary Grimes, DOB 1961/04/20, MRN [id number]  Primary Cardiologist: Garwin Brothers, MD Primary Heart Failure: None Electrophysiologist: Henlee Donovan Jorja Loa, MD      History of Present Illness:   Gary Grimes is a 63 y.o. male with h/o atrial fibrillation/flutter, hypertension, hyperlipidemia, sleep apnea seen today for  for Electrophysiology evaluation of atrial fibrillation at the request of Gary Grimes.    He feels fatigued, weak, short of breath.  He has been feeling this way for many months.  He is unfortunately in atrial fibrillation today.  He did well after his ablation and remained in sinus rhythm for quite some time, but has unfortunately had a recurrence.  He would prefer to avoid long-term medications and is interested in ablation.  Review of systems complete and found to be negative unless listed in HPI.   EP Information / Studies Reviewed:    EKG is ordered today. Personal review as below.   Atrial fibrillation    Risk Assessment/Calculations:    CHA2DS2-VASc Score = 2   This indicates a 2.2% annual risk of stroke. The patient's score is based upon: CHF History: 0 HTN History: 1 Diabetes History: 0 Stroke History: 0 Vascular Disease History: 1 Age Score: 0 Gender Score: 0            Physical Exam:   VS:  BP 128/80   Pulse (!) 113   Ht 5\' 11"  (1.803 m)   Wt (!) 320 lb 12.8 oz (145.5 kg)   SpO2 98%   BMI 44.74 kg/m    Wt Readings from Last 3 Encounters:  01/17/24 (!) 320 lb 12.8 oz (145.5 kg)  01/04/24 (!) 320 lb (145.2 kg)  12/28/23 (!) 322 lb 9.6 oz (146.3 kg)     GEN: Well nourished, well developed in no acute distress NECK: No JVD; No carotid bruits CARDIAC: Irregularly irregular rate and rhythm, no murmurs, rubs, gallops RESPIRATORY:  Clear to auscultation without rales, wheezing or rhonchi  ABDOMEN: Soft, non-tender, non-distended EXTREMITIES:  No edema; No deformity    ASSESSMENT AND PLAN:    1.  Paroxysmal atrial fibrillation/flutter: Post ablation 10/28/2018.  He is unfortunately had more frequent episodes of atrial fibrillation.  Due to that, we Esiah Bazinet plan for ablation.  He would like to avoid long-term medications.  Risk and benefits have been discussed.  He understands the risks and is agreed to the procedure.  In the interim, we Kazzandra Desaulniers start amiodarone and plan for cardioversion.  Temari Schooler also start diltiazem 180 mg daily for rate control.  2.  Hypertension: Currently well-controlled  3.  Obstructive sleep apnea: CPAP compliance encouraged  4.  Obesity: Lifestyle modification encouraged  Follow up with Dr. Elberta Fortis as usual post procedure  Signed, Navjot Pilgrim Jorja Loa, MD

## 2024-01-17 ENCOUNTER — Encounter: Payer: Self-pay | Admitting: *Deleted

## 2024-01-17 ENCOUNTER — Ambulatory Visit: Payer: BC Managed Care – PPO | Attending: Cardiology | Admitting: Cardiology

## 2024-01-17 ENCOUNTER — Encounter: Payer: Self-pay | Admitting: Cardiology

## 2024-01-17 VITALS — BP 128/80 | HR 113 | Ht 71.0 in | Wt 320.8 lb

## 2024-01-17 DIAGNOSIS — I480 Paroxysmal atrial fibrillation: Secondary | ICD-10-CM | POA: Diagnosis not present

## 2024-01-17 DIAGNOSIS — I10 Essential (primary) hypertension: Secondary | ICD-10-CM | POA: Diagnosis not present

## 2024-01-17 MED ORDER — SILDENAFIL CITRATE 100 MG PO TABS: 50.0000 mg | ORAL_TABLET | Freq: Every day | ORAL | 11 refills | Status: DC | PRN

## 2024-01-17 MED ORDER — DILTIAZEM HCL ER COATED BEADS 180 MG PO CP24: 180.0000 mg | ORAL_CAPSULE | Freq: Every day | ORAL | 6 refills | Status: DC

## 2024-01-17 MED ORDER — AMIODARONE HCL 200 MG PO TABS: ORAL_TABLET | ORAL | 3 refills | Status: DC

## 2024-01-17 NOTE — Addendum Note (Signed)
Addended by: Loyola Mast on: 01/17/2024 05:31 PM   Modules accepted: Orders

## 2024-01-17 NOTE — Patient Instructions (Addendum)
Medication Instructions:  Your physician has recommended you make the following change in your medication:  STOP Doxepin  (restart Belsomra) START Amiodarone  -- START THIS ON [facility] 1/22  - take 2 tablets (400 mg total) TWICE a day for 2 weeks, then  - take 1 tablet (200 mg total) TWICE a day for 2 weeks, then  - take 1 tablet (200 mg total) ONCE a day 3. START Diltiazem 180 mg once daily   *If you need a refill on your cardiac medications before your next appointment, please call your pharmacy*   Lab Work: Pre procedure labs -- we will call you to schedule:  BMP & CBC  If you have a lab test that is abnormal and we need to change your treatment, we will call you to review the results -- otherwise no news is good news.    Testing/Procedures: Your physician has recommended that you have a Cardioversion (DCCV). Electrical Cardioversion uses a jolt of electricity to your heart either through paddles or wired patches attached to your chest. This is a controlled, usually prescheduled, procedure. Defibrillation is done under light anesthesia in the hospital, and you usually go home the day of the procedure. This is done to get your heart back into a normal rhythm. You are not awake for the procedure. Please see the instruction sheet given to you today.   Your physician has requested that you have cardiac CT 1 month PRIOR to your ablation. Cardiac computed tomography (CT) is a painless test that uses an x-ray machine to take clear, detailed pictures of your heart.  We will call you to schedule.  Your physician has recommended that you have an ablation. Catheter ablation is a medical procedure used to treat some cardiac arrhythmias (irregular heartbeats). During catheter ablation, a long, thin, flexible tube is put into a blood vessel in your groin (upper thigh), or neck. This tube is called an ablation catheter. It is then guided to your heart through the blood vessel. Radio frequency waves  destroy small areas of heart tissue where abnormal heartbeats may cause an arrhythmia to start.   Your ablation is scheduled for 04/27/2024. [facility] arrive at [facility] at 8:00 am.  We will call you for further instructions.   Follow-Up: At [facility], you and your health needs are our priority.  As part of our continuing mission to provide you with exceptional heart care, we have created designated Provider Care Teams.  These Care Teams include your primary Cardiologist (physician) and Advanced Practice Providers (APPs -  Physician Assistants and Nurse Practitioners) who all work together to provide you with the care you need, when you need it.  Your next appointment:   1 month(s) after your ablation  The format for your next appointment:   In Person  Provider:   [facility] clinic   Thank you for choosing [facility]!!   Dory Horn, RN [phone]    Other Instructions   Cardiac Ablation Cardiac ablation is a procedure to destroy (ablate) some heart tissue that is sending bad signals. These bad signals cause problems in heart rhythm. The heart has many areas that make these signals. If there are problems in these areas, they can make the heart beat in a way that is not normal. Destroying some tissues can help make the heart rhythm normal. Tell your doctor about: Any allergies you have. All medicines you are taking. These include vitamins, herbs, eye drops, creams, and over-the-counter medicines. Any problems you  or family members have had with medicines that make you fall asleep (anesthetics). Any blood disorders you have. Any surgeries you have had. Any medical conditions you have, such as kidney failure. Whether you are pregnant or may be pregnant. What are the risks? This is a safe procedure. But problems may occur, including: Infection. Bruising and bleeding. Bleeding into the chest. Stroke or blood clots. Damage to nearby areas of your body. Allergies  to medicines or dyes. The need for a pacemaker if the normal system is damaged. Failure of the procedure to treat the problem. What happens before the procedure? Medicines Ask your doctor about: Changing or stopping your normal medicines. This is important. Taking aspirin and ibuprofen. Do not take these medicines unless your doctor tells you to take them. Taking other medicines, vitamins, herbs, and supplements. General instructions Follow instructions from your doctor about what you cannot eat or drink. Plan to have someone take you home from the hospital or clinic. If you will be going home right after the procedure, plan to have someone with you for 24 hours. Ask your doctor what steps will be taken to prevent infection. What happens during the procedure?  An IV tube will be put into one of your veins. You will be given a medicine to help you relax. The skin on your neck or groin will be numbed. A cut (incision) will be made in your neck or groin. A needle will be put through your cut and into a large vein. A tube (catheter) will be put into the needle. The tube will be moved to your heart. Dye may be put through the tube. This helps your doctor see your heart. Small devices (electrodes) on the tube will send out signals. A type of energy will be used to destroy some heart tissue. The tube will be taken out. Pressure will be held on your cut. This helps stop bleeding. A bandage will be put over your cut. The exact procedure may vary among doctors and hospitals. What happens after the procedure? You will be watched until you leave the hospital or clinic. This includes checking your heart rate, breathing rate, oxygen, and blood pressure. Your cut will be watched for bleeding. You will need to lie still for a few hours. Do not drive for 24 hours or as long as your doctor tells you. Summary Cardiac ablation is a procedure to destroy some heart tissue. This is done to treat heart  rhythm problems. Tell your doctor about any medical conditions you may have. Tell him or her about all medicines you are taking to treat them. This is a safe procedure. But problems may occur. These include infection, bruising, bleeding, and damage to nearby areas of your body. Follow what your doctor tells you about food and drink. You may also be told to change or stop some of your medicines. After the procedure, do not drive for 24 hours or as long as your doctor tells you. This information is not intended to replace advice given to you by your health care provider. Make sure you discuss any questions you have with your health care provider. Document Revised: 03/06/2022 Document Reviewed: 11/16/2019 Elsevier Patient Education  2023 Elsevier Inc.   Cardiac Ablation, Care After  This sheet gives you information about how to care for yourself after your procedure. Your health care provider may also give you more specific instructions. If you have problems or questions, contact your health care provider. What can I expect after the  procedure? After the procedure, it is common to have: Bruising around your puncture site. Tenderness around your puncture site. Skipped heartbeats. If you had an atrial fibrillation ablation, you may have atrial fibrillation during the first several months after your procedure.  Tiredness (fatigue).  Follow these instructions at home: Puncture site care  Follow instructions from your health care provider about how to take care of your puncture site. Make sure you: If present, leave stitches (sutures), skin glue, or adhesive strips in place. These skin closures may need to stay in place for up to 2 weeks. If adhesive strip edges start to loosen and curl up, you may trim the loose edges. Do not remove adhesive strips completely unless your health care provider tells you to do that. If a large square bandage is present, this may be removed 24 hours after surgery.   Check your puncture site every day for signs of infection. Check for: Redness, swelling, or pain. Fluid or blood. If your puncture site starts to bleed, lie down on your back, apply firm pressure to the area, and contact your health care provider. Warmth. Pus or a bad smell. A pea or small marble sized lump at the site is normal and can take up to three months to resolve.  Driving Do not drive for at least 4 days after your procedure or however long your health care provider recommends. (Do not resume driving if you have previously been instructed not to drive for other health reasons.) Do not drive or use heavy machinery while taking prescription pain medicine. Activity Avoid activities that take a lot of effort for at least 7 days after your procedure. Do not lift anything that is heavier than 5 lb (4.5 kg) for one week.  No sexual activity for 1 week.  Return to your normal activities as told by your health care provider. Ask your health care provider what activities are safe for you. General instructions Take over-the-counter and prescription medicines only as told by your health care provider. Do not use any products that contain nicotine or tobacco, such as cigarettes and e-cigarettes. If you need help quitting, ask your health care provider. You may shower after 24 hours, but Do not take baths, swim, or use a hot tub for 1 week.  Do not drink alcohol for 24 hours after your procedure. Keep all follow-up visits as told by your health care provider. This is important. Contact a health care provider if: You have redness, mild swelling, or pain around your puncture site. You have fluid or blood coming from your puncture site that stops after applying firm pressure to the area. Your puncture site feels warm to the touch. You have pus or a bad smell coming from your puncture site. You have a fever. You have chest pain or discomfort that spreads to your neck, jaw, or arm. You have chest  pain that is worse with lying on your back or taking a deep breath. You are sweating a lot. You feel nauseous. You have a fast or irregular heartbeat. You have shortness of breath. You are dizzy or light-headed and feel the need to lie down. You have pain or numbness in the arm or leg closest to your puncture site. Get help right away if: Your puncture site suddenly swells. Your puncture site is bleeding and the bleeding does not stop after applying firm pressure to the area. These symptoms may represent a serious problem that is an emergency. Do not wait to see if  the symptoms will go away. Get medical help right away. Call your local emergency services (911 in the [place] Do not drive yourself to the hospital. Summary After the procedure, it is normal to have bruising and tenderness at the puncture site in your groin, neck, or forearm. Check your puncture site every day for signs of infection. Get help right away if your puncture site is bleeding and the bleeding does not stop after applying firm pressure to the area. This is a medical emergency. This information is not intended to replace advice given to you by your health care provider. Make sure you discuss any questions you have with your health care provider.

## 2024-01-19 ENCOUNTER — Telehealth: Payer: Self-pay | Admitting: Internal Medicine

## 2024-01-19 MED ORDER — SURE COMFORT PEN NEEDLES 32G X 4 MM MISC: 5 refills | Status: AC

## 2024-01-19 NOTE — Telephone Encounter (Signed)
MEDICATION: Sure Comfort Pen Needles SURE COMFORT PEN NEEDLES 32G X 4 MM MISC  PHARMACY:    [facility] - [place], [place] - [address] (Ph: [phone])    HAS THE PATIENT CONTACTED THEIR PHARMACY?  Yes  IS THIS A 90 DAY SUPPLY : Yes  IS PATIENT OUT OF MEDICATION: Yes  IF NOT; HOW MUCH IS LEFT:   LAST APPOINTMENT DATE: @11 /14/2024  NEXT APPOINTMENT DATE:@2 /14/2025  DO WE HAVE YOUR PERMISSION TO LEAVE A DETAILED MESSAGE?: Yes  OTHER COMMENTS: Patient states that he tests  twice a day not once and that the pharmacy is telling him that it's too early for a refill   **Let patient know to contact pharmacy at the end of the day to make sure medication is ready. **  ** Please notify patient to allow 48-72 hours to process**  **Encourage patient to contact the pharmacy for refills or they can request refills through [facility]**

## 2024-01-19 NOTE — Telephone Encounter (Signed)
Requested Prescriptions   Signed Prescriptions Disp Refills   Insulin Pen Needle (SURE COMFORT PEN NEEDLES) 32G X 4 MM MISC 100 each 5    Sig: Use daily to inject insulin    Authorizing Provider: Carlus Pavlov    Ordering User: Pollie Meyer

## 2024-01-21 ENCOUNTER — Telehealth: Payer: Self-pay

## 2024-01-21 DIAGNOSIS — N521 Erectile dysfunction due to diseases classified elsewhere: Secondary | ICD-10-CM

## 2024-01-21 NOTE — Telephone Encounter (Signed)
Copied from CRM [id number]. Topic: Clinical - Medical Advice >> Jan 21, 2024  9:47 AM Sonny Dandy B wrote: Reason for CRM: pt called to request the provider to stop his blood pressure medication, pt states he is having problems with erectile dysfunction. Pt is asking if the provide will change the medication to a medication that does not effect his erectile dysfunction. Please call pt back at [phone]   Is there any other medication that can be taken for high BP that doesn't cause ED? Please review and advise.  Thanks. Dm/cma

## 2024-01-21 NOTE — Addendum Note (Signed)
Addended by: Loyola Mast on: 01/21/2024 03:03 PM   Modules accepted: Orders

## 2024-01-24 NOTE — Telephone Encounter (Signed)
Patient notified VIA phone and has appt with urology in [place].  Dm/cma

## 2024-01-27 ENCOUNTER — Telehealth: Payer: Self-pay | Admitting: Cardiology

## 2024-01-27 ENCOUNTER — Ambulatory Visit: Payer: BC Managed Care – PPO | Admitting: Orthopedic Surgery

## 2024-01-27 DIAGNOSIS — M5416 Radiculopathy, lumbar region: Secondary | ICD-10-CM | POA: Diagnosis not present

## 2024-01-27 NOTE — Telephone Encounter (Signed)
Briefly reviewed DCCV instructions again.  Pt had not heard from the hospital about this procedure and was wondering.  Aware that I am not sure if hospital calls pt before a DCCV.  Pt appreciates the follow up

## 2024-01-27 NOTE — Telephone Encounter (Signed)
Pt calling to get info regarding upcoming cardioversion. Requesting cb

## 2024-01-27 NOTE — Progress Notes (Signed)
Orthopedic Spine Surgery Office Note   Assessment: Patient is a 63 y.o. male with low back pain that radiates into the left lower extremity along the lateral aspect of the thigh and leg     Plan: -Patient has tried PT, oral steroids, intramuscular steroid injection, Lyrica, lumbar transforaminal injection -Patient did well with a prior lumbar steroid injection back in 08/2023, so recommended repeat. Referral provided to him today -Can use tylenol up to 1000mg  TID -Prescribed tramadol for additional pain relief -Has been working on weight loss. Is currently on mounjaro -Patient should return to office on an as needed basis     Patient expressed understanding of the plan and all questions were answered to the patient's satisfaction.    ___________________________________________________________________________     History:   Patient is a 63 y.o. male who presents today for acute worsening of his chronic low back pain.  Patient states that it has gotten worse within the last 4 weeks.  He has started using an old back brace to try and help.  He feels it in the lower lumbar region and will radiate into the left lateral thigh.  His most significant pain now is in the low back.  He says any movement can really aggravate the pain.  The only thing that he has found helpful is laying flat on a hard surface.  He has not noticed any weakness in his lower extremities.  No bowel or bladder incontinence.  No saddle anesthesia.  Patient had previously gotten a transforaminal injection in September 2024.  He noted significant relief with that injection he rated about 70%.  He said it lasted for approximately 3 to 4 months.   Treatments tried: PT, oral steroids, intramuscular steroids, Lyrica, lumbar transforaminal injection     Physical Exam:   General: no acute distress, appears stated age Neurologic: alert, answering questions appropriately, following commands Respiratory: unlabored breathing on room  air, symmetric chest rise Psychiatric: appropriate affect, normal cadence to speech     MSK (spine):   -Strength exam                                                   Left                  Right EHL                              5/5                  5/5 TA                                 5/5                  5/5 GSC                             5/5                  5/5 Knee extension            5/5                  5/5  Hip flexion                    5/5                  5/5   -Sensory exam                           Sensation intact to light touch in L3-S1 nerve distributions of bilateral lower extremities   -Achilles DTR: 1/4 on the left, 1/4 on the right -Patellar tendon DTR: 1/4 on the left, 1/4 on the right   -Straight leg raise: Negative bilaterally -Femoral nerve stretch test: Negative bilaterally -Clonus: no beats bilaterally   Imaging: XRs of the lumbar spine from 08/20/2023 were previously independently reviewed and interpreted, showing disc height loss at L5/S1. No other significant degenerative changes. No fracture or dislocation seen. No evidence of instability on flexion/extension views.    MRI of the lumbar spine from 07/30/2023 was previously independently reviewed and interpreted, showing disc desiccation at L2/3 and L3/4 and facet hypertrophy at L4/5. No other significant degenerative changes.  Mild foraminal stenosis on the left at L3/4 and L4/5.  Mild lateral recess stenosis bilaterally L4/5.  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     Patient name: Gary Grimes Patient MRN: [id number] Date of visit: 01/27/24

## 2024-01-28 ENCOUNTER — Telehealth: Payer: Self-pay | Admitting: Orthopedic Surgery

## 2024-01-28 NOTE — Telephone Encounter (Signed)
Patient called asked if he can be referred to Dr. Alvester Morin for an injection in his back?  The number to contact patient is  [phone]

## 2024-02-03 ENCOUNTER — Ambulatory Visit: Payer: Self-pay | Admitting: Family Medicine

## 2024-02-03 DIAGNOSIS — R110 Nausea: Secondary | ICD-10-CM

## 2024-02-03 MED ORDER — ONDANSETRON 4 MG PO TBDP: 4.0000 mg | ORAL_TABLET | Freq: Three times a day (TID) | ORAL | 2 refills | Status: DC | PRN

## 2024-02-03 NOTE — Telephone Encounter (Signed)
 Patient notified VIA phone. Dm/cma

## 2024-02-03 NOTE — Telephone Encounter (Signed)
  Chief Complaint: Medication concern Symptoms: Nausea Frequency: since last night Pertinent Negatives: Patient denies any other symptoms Disposition: [] ED /[] Urgent Care (no appt availability in office) / [] Appointment(In office/virtual)/ []  [facility]/ [] Home Care/ [] Refused Recommended Disposition /[] [facility]/ [x]  Follow-up with PCP Additional Notes: Patient called stating he took his Monjauro shot yesterday morning and began having nausea last night. Patient states he is not having vomiting or diarrhea. Patient is requesting medication for nausea be called into pharmacy, states if it is called in before 1300 they will deliver it to him at home. Per protocol, patient to speak with PCP within 24 hours. Patient declines appt. Alerting PCP for review and f/u.   Reason for Disposition  Taking prescription medication that could cause nausea (e.g., narcotics/opiates, antibiotics, OCPs, many others)  Answer Assessment - Initial Assessment Questions 1. NAUSEA SEVERITY: How bad is the nausea? (e.g., mild, moderate, severe; dehydration, weight loss)   - MILD: loss of appetite without change in eating habits   - MODERATE: decreased oral intake without significant weight loss, dehydration, or malnutrition   - SEVERE: inadequate caloric or fluid intake, significant weight loss, symptoms of dehydration     Moderate 2. ONSET: When did the nausea begin?     Yesterday evening 3. VOMITING: Any vomiting? If Yes, ask: How many times today?     Denies, but feels he is close 4. RECURRENT SYMPTOM: Have you had nausea before? If Yes, ask: When was the last time? What happened that time?     Has not had this before 5. CAUSE: What do you think is causing the nausea?     Monjauro  Protocols used: [employer]

## 2024-02-03 NOTE — Telephone Encounter (Signed)
 Patient called stating he took his Monjauro shot yesterday morning and began having nausea last night. Patient states he is not having vomiting or diarrhea. Patient is requesting medication for nausea be called into pharmacy, states if it is called in before 1300 they will deliver it to him at home.    Please review and advise  Thanks. Dm/cma

## 2024-02-03 NOTE — Telephone Encounter (Signed)
 This RN made an outgoing call for first attempt to triage. No answer, left a message. Will route appropriately for more attempts.   Copied from CRM [id number]. Topic: Clinical - Prescription Issue >> Feb 03, 2024  8:55 AM Isabell A wrote: Reason for CRM: Patient states Mounjaro  is making him nauseous, he is requesting Zofran  to be sent to his pharmacy.  [facility] - [place], [place] - [address], [place] [address] Phone: [phone]  Fax: [phone]

## 2024-02-04 ENCOUNTER — Ambulatory Visit (INDEPENDENT_AMBULATORY_CARE_PROVIDER_SITE_OTHER): Payer: HMO | Admitting: Orthopedic Surgery

## 2024-02-04 ENCOUNTER — Encounter: Payer: Self-pay | Admitting: Orthopedic Surgery

## 2024-02-04 VITALS — BP 137/82 | HR 67 | Ht 71.0 in | Wt 307.0 lb

## 2024-02-04 DIAGNOSIS — M1712 Unilateral primary osteoarthritis, left knee: Secondary | ICD-10-CM | POA: Diagnosis not present

## 2024-02-04 DIAGNOSIS — Z9889 Other specified postprocedural states: Secondary | ICD-10-CM

## 2024-02-04 MED ORDER — HYALURONAN 30 MG/2ML IX SOSY
15.0000 mg | PREFILLED_SYRINGE | INTRA_ARTICULAR | Status: AC
  Administered 2024-02-04 – 2024-02-25 (×3): 15 mg via INTRA_ARTICULAR

## 2024-02-04 NOTE — Progress Notes (Signed)
 Chief Complaint  Patient presents with   Knee Pain    Orthovisc left knee     Encounter Diagnoses  Name Primary?   S/P left knee arthroscopy 04/28/23 medial meniscal repair Yes   Primary osteoarthritis of left knee    Patient: Gary Grimes           Date of Birth: 1961-03-21           MRN: [id number] Visit Date: 02/04/2024 Requested by: Thedora Garnette HERO, MD [address] [place],  [place] [address] PCP: Thedora Garnette HERO, MD  Chief Complaint  Patient presents with   Knee Pain    Orthovisc left knee      Encounter Diagnoses  Name Primary?   S/P left knee arthroscopy 04/28/23 medial meniscal repair Yes   Primary osteoarthritis of left knee     The patient has consented to and requested hyaluronic acid injection   MEDICATION: ORTHOVISC  left  THE knee is prepped with alcohol and ethyl chloride  The injection is performed with a 21-gauge needle, via inferolateral approach  No complications were noted  Appropriate instructions post injection were given

## 2024-02-04 NOTE — Progress Notes (Signed)
   BP 137/82   Pulse 67   Ht 5' 11 (1.803 m)   Wt (!) 307 lb (139.3 kg)   BMI 42.82 kg/m   Body mass index is 42.82 kg/m.  Chief Complaint  Patient presents with   Knee Pain    Orthovisc left knee      No diagnosis found.  DOI/DOS/ Date: 02/04/24  Improved

## 2024-02-06 NOTE — Pre-Procedure Instructions (Signed)
 Spoke with patient regarding procedure instructions.  Patient is aware that he needs to hold Mounjaro 7 days before procedure on 2/18.

## 2024-02-07 ENCOUNTER — Ambulatory Visit: Payer: BC Managed Care – PPO | Admitting: Cardiology

## 2024-02-08 ENCOUNTER — Ambulatory Visit: Payer: Self-pay | Admitting: Family Medicine

## 2024-02-08 NOTE — Telephone Encounter (Signed)
Chief Complaint: Medication concern Symptoms: Erectile dysfunction Frequency: n/a Pertinent Negatives: Patient denies n/a Disposition: [] ED /[] Urgent Care (no appt availability in office) / [] Appointment(In office/virtual)/ []  [facility]/ [] Home Care/ [] Refused Recommended Disposition /[] [facility]/ [x]  Follow-up with PCP Additional Notes: Patient called in stating he has been experiencing erectile dysfunction and believes it to be directly related to Lyrica. Patient states he has not had any signs of neuropathy and his blood sugar levels have been running in low 100's. Patient states "he would like to stop this medication immediately" but wants physician advice on the appropriate way to come off of this medication. Please call patient back asap at [phone].    Copied from CRM [id number]. Topic: Clinical - Medication Question >> Feb 08, 2024  4:13 PM Florestine Avers wrote: Reason for CRM: Patient states that he wants to stop taking his medication pregabalin (LYRICA) 200 MG capsule. Patient is requesting a call back. Reason for Disposition  [1] Caller has NON-URGENT medicine question about med that PCP prescribed AND [2] triager unable to answer question  Answer Assessment - Initial Assessment Questions 1. NAME of MEDICINE: "What medicine(s) are you calling about?"     Lyrica  2. QUESTION: "What is your question?" (e.g., double dose of medicine, side effect)     Stop taking medication 3. PRESCRIBER: "Who prescribed the medicine?" Reason: if prescribed by specialist, call should be referred to that group.     [facility]  4. SYMPTOMS: "Do you have any symptoms?" If Yes, ask: "What symptoms are you having?"  "How bad are the symptoms (e.g., mild, moderate, severe)     None - see nurse notes  Protocols used: Medication Question Call-A-AH

## 2024-02-08 NOTE — Telephone Encounter (Signed)
Noted. Dm/cma

## 2024-02-08 NOTE — Telephone Encounter (Signed)
Copied from CRM [id number]. Topic: Clinical - Pink Word Triage >> Feb 08, 2024 10:38 AM Fredrich Romans wrote: Reason for Triage: constipation x 4 days

## 2024-02-08 NOTE — Telephone Encounter (Signed)
Patient called back-patient states that since calling this morning he took magnesium citrate and is no longer constipated. Patient denies any other concerns at this time.

## 2024-02-09 ENCOUNTER — Ambulatory Visit: Payer: HMO | Admitting: Urology

## 2024-02-09 ENCOUNTER — Encounter: Payer: Self-pay | Admitting: Urology

## 2024-02-09 VITALS — BP 136/73 | HR 80 | Ht 71.0 in | Wt 302.0 lb

## 2024-02-09 DIAGNOSIS — N529 Male erectile dysfunction, unspecified: Secondary | ICD-10-CM

## 2024-02-09 NOTE — Telephone Encounter (Signed)
Patient called in stating he has been experiencing erectile dysfunction and believes it to be directly related to Lyrica. Patient states he has not had any signs of neuropathy and his blood sugar levels have been running in low 100's. Patient states "he would like to stop this medication immediately" but wants physician advice on the appropriate way to come off of this medication. Please call patient back asap at [phone].    Spoke to patient.  He is willing to schedule an appointment but the first one is next week.  Can he stop taking the Lyrica?  Please review and advise.  Thanks. Dm/cma
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
## 2024-02-10 ENCOUNTER — Ambulatory Visit: Payer: HMO | Admitting: Physical Medicine and Rehabilitation

## 2024-02-10 ENCOUNTER — Encounter: Payer: HMO | Admitting: Physical Medicine and Rehabilitation

## 2024-02-10 ENCOUNTER — Other Ambulatory Visit: Payer: Self-pay

## 2024-02-10 VITALS — BP 135/77 | HR 64

## 2024-02-10 DIAGNOSIS — M5416 Radiculopathy, lumbar region: Secondary | ICD-10-CM | POA: Diagnosis not present

## 2024-02-10 MED ORDER — METHYLPREDNISOLONE ACETATE 40 MG/ML IJ SUSP
40.0000 mg | Freq: Once | INTRAMUSCULAR | Status: AC
  Administered 2024-02-10: 40 mg

## 2024-02-10 NOTE — Patient Instructions (Signed)

## 2024-02-10 NOTE — Progress Notes (Signed)
Pain Score---10 tingling and numbness when on his feet for a long period of time

## 2024-02-10 NOTE — Telephone Encounter (Signed)
Patient notified VIA phone and scheduled for 02/16/24 @ 8:20 am. Dm/cma

## 2024-02-11 ENCOUNTER — Encounter: Payer: Self-pay | Admitting: Internal Medicine

## 2024-02-11 ENCOUNTER — Ambulatory Visit: Payer: HMO | Admitting: Orthopedic Surgery

## 2024-02-11 ENCOUNTER — Ambulatory Visit (INDEPENDENT_AMBULATORY_CARE_PROVIDER_SITE_OTHER): Payer: Medicare Other | Admitting: Internal Medicine

## 2024-02-11 VITALS — BP 124/72 | HR 87 | Resp 96 | Ht 71.0 in | Wt 308.6 lb

## 2024-02-11 DIAGNOSIS — E119 Type 2 diabetes mellitus without complications: Secondary | ICD-10-CM

## 2024-02-11 DIAGNOSIS — M1712 Unilateral primary osteoarthritis, left knee: Secondary | ICD-10-CM

## 2024-02-11 DIAGNOSIS — Z9889 Other specified postprocedural states: Secondary | ICD-10-CM

## 2024-02-11 DIAGNOSIS — E782 Mixed hyperlipidemia: Secondary | ICD-10-CM | POA: Diagnosis not present

## 2024-02-11 DIAGNOSIS — Z7985 Long-term (current) use of injectable non-insulin antidiabetic drugs: Secondary | ICD-10-CM

## 2024-02-11 DIAGNOSIS — Z794 Long term (current) use of insulin: Secondary | ICD-10-CM | POA: Diagnosis not present

## 2024-02-11 LAB — POCT GLYCOSYLATED HEMOGLOBIN (HGB A1C): Hemoglobin A1C: 7.3 % — AB (ref 4.0–5.6)

## 2024-02-11 NOTE — Patient Instructions (Addendum)
Please continue: - U500 - 45 to 60 minutes before a meal: - 70 units before b'fast  - 50 units before dinner - Tresiba 26 units daily  - Mounjaro 5 mg weekly - started 12/2023  Try to change b'fast: 1.5 scoops of protein powder.  After increasing the Mounjaro dose, try to stop [place].  Please return in 3 months.

## 2024-02-11 NOTE — Progress Notes (Signed)
Encounter Diagnoses  Name Primary?   S/P left knee arthroscopy 04/28/23 medial meniscal repair Yes   Primary osteoarthritis of left knee    Here for injection of HA   The patient has consented to and requested hyaluronic acid injection    MEDICATION: ORTHOVISC   left   THE knee is prepped with alcohol and ethyl chloride   The injection is performed with a 21-gauge needle, via inferolateral approach   No complications were noted   Appropriate instructions post injection were given   Return 1 week for   3/3 injx
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
## 2024-02-11 NOTE — Addendum Note (Signed)
Addended by: Pollie Meyer on: 02/11/2024 03:48 PM   Modules accepted: Orders

## 2024-02-14 ENCOUNTER — Telehealth: Payer: Self-pay | Admitting: Family Medicine

## 2024-02-14 NOTE — Telephone Encounter (Signed)
Copied from CRM [id number]. Topic: Referral - Request for Referral >> Feb 14, 2024  2:30 PM Dennison Nancy wrote: Did the patient discuss referral with their provider in the last year? Yes (If No - schedule appointment) (If Yes - send message)  Appointment offered? No  Type of order/referral and detailed reason for visit: requesting a referral the ED for physical therapy   Preference of office, provider, location: no preference need something so as possible   If referral order, have you been seen by this specialty before? No (If Yes, this issue or another issue? When? Where?  Can we respond through MyChart? Yes , however perfer a phone call

## 2024-02-14 NOTE — OR Nursing (Signed)
Called patient with pre-procedure instructions for tomorrow.   Patient informed of:   Time to arrive for procedure. 0845 Remain NPO past midnight.  Must have a ride home and a responsible adult to remain with them for 24 hours post procedure.  Confirmed blood thinner. Confirmed no breaks in taking blood thinner for 3+ weeks prior to procedure. Confirmed patient stopped all GLP-1s and GLP-2s for at least one week before procedure.

## 2024-02-15 ENCOUNTER — Ambulatory Visit (HOSPITAL_COMMUNITY)
Admission: RE | Admit: 2024-02-15 | Discharge: 2024-02-15 | Disposition: A | Discharge status: Home / Self Care | Payer: HMO | Attending: Cardiology | Admitting: Cardiology

## 2024-02-15 DIAGNOSIS — I480 Paroxysmal atrial fibrillation: Secondary | ICD-10-CM | POA: Insufficient documentation

## 2024-02-15 DIAGNOSIS — Z539 Procedure and treatment not carried out, unspecified reason: Secondary | ICD-10-CM | POA: Insufficient documentation

## 2024-02-15 DIAGNOSIS — Z8679 Personal history of other diseases of the circulatory system: Secondary | ICD-10-CM

## 2024-02-15 SURGERY — CARDIOVERSION (CATH LAB)
Anesthesia: General

## 2024-02-15 NOTE — Telephone Encounter (Signed)
Has a virtual appt 02/16/24. Dm/cma

## 2024-02-15 NOTE — Progress Notes (Signed)
Patient was in normal sinus rhythm heart rate 61 bpm.  Cardioversion canceled.

## 2024-02-16 ENCOUNTER — Telehealth: Payer: HMO | Admitting: Family Medicine

## 2024-02-16 VITALS — Ht 71.0 in | Wt 304.0 lb

## 2024-02-16 DIAGNOSIS — E1140 Type 2 diabetes mellitus with diabetic neuropathy, unspecified: Secondary | ICD-10-CM | POA: Diagnosis not present

## 2024-02-16 DIAGNOSIS — Z7985 Long-term (current) use of injectable non-insulin antidiabetic drugs: Secondary | ICD-10-CM

## 2024-02-16 DIAGNOSIS — E1142 Type 2 diabetes mellitus with diabetic polyneuropathy: Secondary | ICD-10-CM | POA: Diagnosis not present

## 2024-02-16 DIAGNOSIS — Z794 Long term (current) use of insulin: Secondary | ICD-10-CM | POA: Diagnosis not present

## 2024-02-16 DIAGNOSIS — I480 Paroxysmal atrial fibrillation: Secondary | ICD-10-CM | POA: Diagnosis not present

## 2024-02-16 DIAGNOSIS — N521 Erectile dysfunction due to diseases classified elsewhere: Secondary | ICD-10-CM

## 2024-02-16 MED ORDER — SILDENAFIL CITRATE 100 MG PO TABS: 50.0000 mg | ORAL_TABLET | Freq: Every day | ORAL | 11 refills | Status: DC | PRN

## 2024-02-16 MED ORDER — TIRZEPATIDE 7.5 MG/0.5ML ~~LOC~~ SOAJ: 7.5000 mg | SUBCUTANEOUS | 1 refills | Status: DC

## 2024-02-16 MED ORDER — PREGABALIN 50 MG PO CAPS: ORAL_CAPSULE | ORAL | 0 refills | Status: DC

## 2024-02-16 NOTE — Assessment & Plan Note (Signed)
Diabetes is improving. Continue  U-500 and Tresiba as managed by Dr. Elvera Lennox. I will increase his tirzepatide to 7.5 mg weekly.

## 2024-02-16 NOTE — Patient Instructions (Signed)
I will send in a prescription for pregabalin 50 mg capsules. You will use a combination of your existing 200 mg capsules and these 50 mg capsules to slowly taper your dose. Taper schedule: Take one (1) 200 mg capsule and three (3) 50 mg capsules daily for 4 days Take one (1) 200 mg capsule and two (2) 50 mg capsules daily for 4 days Take one (1) 200 mg capsule and one (1) 50 mg capsules daily for 4 days Take one (1) 200 mg capsule daily for 4 days Take three (3) 50 mg capsules daily for 4 days Take two (2) 50 mg capsules daily for 4 days Take one (1) 50 mg capsules daily for 4 days Then stop.

## 2024-02-16 NOTE — Assessment & Plan Note (Signed)
Improved. I am agreeable to Mr. Dupee trying to taper his pregabalin. I will send in a prescription for pregabalin 50 mg capsules, to combine with his 200 mg capsules to slowly taper his dose. Taper schedule: Take one (1) 200 mg capsule and three (3) 50 mg capsules daily for 4 days Take one (1) 200 mg capsule and two (2) 50 mg capsules daily for 4 days Take one (1) 200 mg capsule and one (1) 50 mg capsules daily for 4 days Take one (1) 200 mg capsule daily for 4 days Take three (3) 50 mg capsules daily for 4 days Take two (2) 50 mg capsules daily for 4 days Take one (1) 50 mg capsules daily for 4 days Then stop.
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
## 2024-02-16 NOTE — Assessment & Plan Note (Signed)
I will renew his sildenafil. I will refer him for PT related to his ED. I agree with trying to taper his pregabalin (as noted above) to see if this will improve his erections.

## 2024-02-16 NOTE — Assessment & Plan Note (Signed)
Plan for cardiac ablation. Continue diltiazem CD 180 mg daily for rate control, amiodarone 200 mg daily for rhythm control, and apixaban 5 mg bid for anticoagulation.

## 2024-02-18 ENCOUNTER — Ambulatory Visit: Payer: HMO | Admitting: Orthopedic Surgery

## 2024-02-21 NOTE — Procedures (Signed)
 Lumbosacral Transforaminal Epidural Steroid Injection - Sub-Pedicular Approach with Fluoroscopic Guidance  Patient: Gary Grimes      Date of Birth: 1961/04/27 MRN: [id number] PCP: Loyola Mast, MD      Visit Date: 02/10/2024   Universal Protocol:    Date/Time: 02/10/2024  Consent Given By: the patient  Position: PRONE  Additional Comments: Vital signs were monitored before and after the procedure. Patient was prepped and draped in the usual sterile fashion. The correct patient, procedure, and site was verified.   Injection Procedure Details:   Procedure diagnoses: Radiculopathy, lumbar region [M54.16]    Meds Administered:  Meds ordered this encounter  Medications   methylPREDNISolone acetate (DEPO-MEDROL) injection 40 mg    Laterality: Left  Location/Site: L4  Needle:5.0 in., 22 ga.  Short bevel or Quincke spinal needle  Needle Placement: Transforaminal  Findings:    -Comments: Excellent flow of contrast along the nerve, nerve root and into the epidural space.  Interestingly, the patient did extremely well with the injection itself as this was a repeat injection from prior.  As soon as the medication was beginning to be delivered into the foramen he essentially yelled out with a surprise amount of pain.  At that point would let him rest a second and deliver the rest of the medication with discomfort but much milder.  Reviewing the images from the prior injection shows essentially the same injection same placement etc.  Patient was discharged difficulty.  Procedure Details: After squaring off the end-plates to get a true AP view, the C-arm was positioned so that an oblique view of the foramen as noted above was visualized. The target area is just inferior to the "nose of the scotty dog" or sub pedicular. The soft tissues overlying this structure were infiltrated with 2-3 ml. of 1% Lidocaine without Epinephrine.  The spinal needle was inserted toward the target using  a "trajectory" view along the fluoroscope beam.  Under AP and lateral visualization, the needle was advanced so it did not puncture dura and was located close the 6 O'Clock position of the pedical in AP tracterory. Biplanar projections were used to confirm position. Aspiration was confirmed to be negative for CSF and/or blood. A 1-2 ml. volume of Isovue-250 was injected and flow of contrast was noted at each level. Radiographs were obtained for documentation purposes.   After attaining the desired flow of contrast documented above, a 0.5 to 1.0 ml test dose of 0.25% Marcaine was injected into each respective transforaminal space.  The patient was observed for 90 seconds post injection.  After no sensory deficits were reported, and normal lower extremity motor function was noted,   the above injectate was administered so that equal amounts of the injectate were placed at each foramen (level) into the transforaminal epidural space.   Additional Comments:  No complications occurred Dressing: 2 x 2 sterile gauze and Band-Aid    Post-procedure details: Patient was observed during the procedure. Post-procedure instructions were reviewed.  Patient left the clinic in stable condition.

## 2024-02-21 NOTE — Progress Notes (Signed)
 Gary Grimes - 63 y.o. male MRN [id number]  Date of birth: 06-12-1961  Office Visit Note: Visit Date: 02/10/2024 PCP: Loyola Mast, MD Referred by: London Sheer, MD  Subjective: Chief Complaint  Patient presents with   Lower Back - Pain   HPI:  Gary Grimes is a 63 y.o. male who comes in today for planned repeat Left L4-5  Lumbar Transforaminal epidural steroid injection with fluoroscopic guidance.  The patient has failed conservative care including home exercise, medications, time and activity modification.  This injection will be diagnostic and hopefully therapeutic.  Please see requesting physician notes for further details and justification. Patient received more than 50% pain relief from prior injection.   Referring: Dr. Willia Craze   ROS Otherwise per HPI.  Assessment & Plan: Visit Diagnoses:    ICD-10-CM   1. Radiculopathy, lumbar region  M54.16 XR C-ARM NO REPORT    Epidural Steroid injection    methylPREDNISolone acetate (DEPO-MEDROL) injection 40 mg      Plan: No additional findings.   Meds & Orders:  Meds ordered this encounter  Medications   methylPREDNISolone acetate (DEPO-MEDROL) injection 40 mg    Orders Placed This Encounter  Procedures   XR C-ARM NO REPORT   Epidural Steroid injection    Follow-up: Return for visit to requesting provider as needed.   Procedures: No procedures performed  Lumbosacral Transforaminal Epidural Steroid Injection - Sub-Pedicular Approach with Fluoroscopic Guidance  Patient: CLEARNCE Grimes      Date of Birth: March 05, 1961 MRN: [id number] PCP: Loyola Mast, MD      Visit Date: 02/10/2024   Universal Protocol:    Date/Time: 02/10/2024  Consent Given By: the patient  Position: PRONE  Additional Comments: Vital signs were monitored before and after the procedure. Patient was prepped and draped in the usual sterile fashion. The correct patient, procedure, and site was verified.   Injection Procedure  Details:   Procedure diagnoses: Radiculopathy, lumbar region [M54.16]    Meds Administered:  Meds ordered this encounter  Medications   methylPREDNISolone acetate (DEPO-MEDROL) injection 40 mg    Laterality: Left  Location/Site: L4  Needle:5.0 in., 22 ga.  Short bevel or Quincke spinal needle  Needle Placement: Transforaminal  Findings:    -Comments: Excellent flow of contrast along the nerve, nerve root and into the epidural space.  Interestingly, the patient did extremely well with the injection itself as this was a repeat injection from prior.  As soon as the medication was beginning to be delivered into the foramen he essentially yelled out with a surprise amount of pain.  At that point would let him rest a second and deliver the rest of the medication with discomfort but much milder.  Reviewing the images from the prior injection shows essentially the same injection same placement etc.  Patient was discharged difficulty.  Procedure Details: After squaring off the end-plates to get a true AP view, the C-arm was positioned so that an oblique view of the foramen as noted above was visualized. The target area is just inferior to the "nose of the scotty dog" or sub pedicular. The soft tissues overlying this structure were infiltrated with 2-3 ml. of 1% Lidocaine without Epinephrine.  The spinal needle was inserted toward the target using a "trajectory" view along the fluoroscope beam.  Under AP and lateral visualization, the needle was advanced so it did not puncture dura and was located close the 6 O'Clock position of the pedical in AP  tracterory. Biplanar projections were used to confirm position. Aspiration was confirmed to be negative for CSF and/or blood. A 1-2 ml. volume of Isovue-250 was injected and flow of contrast was noted at each level. Radiographs were obtained for documentation purposes.   After attaining the desired flow of contrast documented above, a 0.5 to 1.0 ml test  dose of 0.25% Marcaine was injected into each respective transforaminal space.  The patient was observed for 90 seconds post injection.  After no sensory deficits were reported, and normal lower extremity motor function was noted,   the above injectate was administered so that equal amounts of the injectate were placed at each foramen (level) into the transforaminal epidural space.   Additional Comments:  No complications occurred Dressing: 2 x 2 sterile gauze and Band-Aid    Post-procedure details: Patient was observed during the procedure. Post-procedure instructions were reviewed.  Patient left the clinic in stable condition.    Clinical History: MRI LUMBAR SPINE WITHOUT CONTRAST   TECHNIQUE: Multiplanar, multisequence MR imaging of the lumbar spine was performed. No intravenous contrast was administered.   COMPARISON:  CT Abdomen and Pelvis 03/10/2021.   FINDINGS: Segmentation: Normal on the comparison. Left side L5-S1 assimilation joint, normal variant.   Alignment: Stable lumbar lordosis since 2022. Subtle retrolisthesis of L3 on L4.   Vertebrae: Background T1 bone marrow signal is mildly heterogeneous but within normal limits. No marrow edema or evidence of acute osseous abnormality. Intact visible sacrum and SI joints.   Conus medullaris and cauda equina: Conus extends to the T12-L1 level. No lower spinal cord or conus signal abnormality. Unremarkable cauda equina nerve roots.   Paraspinal and other soft tissues: Negative; benign appearing right renal cyst redemonstrated (no follow-up imaging recommended). Negative visualized posterior paraspinal soft tissues.   Disc levels:   There is a degree of congenital spinal canal narrowing due to short pedicle distance (such as at the L2 level on series 5, image 14). The following superimposed degenerative changes are noted:   T11-T12: Subtle disc bulging and posterior element hypertrophy no significant stenosis.    T12-L1:  Negative.   L1-L2:  Negative.   L2-L3: Mild circumferential disc bulge eccentric to the left. Mild facet hypertrophy. Trace degenerative facet joint fluid. No significant stenosis.   L3-L4: Circumferential disc bulge. Mild ligament flavum and mild to moderate facet degenerative facet joint fluid (series 5 image 25). No significant spinal stenosis. Mild to moderate bilateral L3 foraminal stenosis appears greater on the left.   L4-L5: Mild circumferential disc bulge. Mild to moderate facet hypertrophy. No significant spinal stenosis. Moderate left and mild right L4 neural foraminal stenosis.   L5-S1:  Mild facet hypertrophy.  No stenosis.   IMPRESSION: Mild congenital spinal canal narrowing. Superimposed lumbar disc bulging and facet degeneration. Overall no significant spinal stenosis, but up to moderate left neural foraminal stenosis at L3-L4 and L4-L5. Query Left L3 and/or L4 nerve levels.     Electronically Signed   By: Odessa Fleming M.D.   On: 08/09/2023 12:03     Objective:  VS:  HT:    WT:   BMI:     BP:135/77  HR:64bpm  TEMP: ( )  RESP:  Physical Exam Vitals and nursing note reviewed.  Constitutional:      General: He is not in acute distress.    Appearance: Normal appearance. He is obese. He is not ill-appearing.  HENT:     Head: Normocephalic and atraumatic.     Right Ear: External  ear normal.     Left Ear: External ear normal.     Nose: No congestion.  Eyes:     Extraocular Movements: Extraocular movements intact.  Cardiovascular:     Rate and Rhythm: Normal rate.     Pulses: Normal pulses.  Pulmonary:     Effort: Pulmonary effort is normal. No respiratory distress.  Abdominal:     General: There is no distension.     Palpations: Abdomen is soft.  Musculoskeletal:        General: No tenderness or signs of injury.     Cervical back: Neck supple.     Right lower leg: No edema.     Left lower leg: No edema.     Comments: Patient has good  distal strength without clonus.  Skin:    Findings: No erythema or rash.  Neurological:     General: No focal deficit present.     Mental Status: He is alert and oriented to person, place, and time.     Sensory: No sensory deficit.     Motor: No weakness or abnormal muscle tone.     Coordination: Coordination normal.  Psychiatric:        Mood and Affect: Mood normal.        Behavior: Behavior normal.      Imaging: No results found.

## 2024-02-25 ENCOUNTER — Ambulatory Visit: Payer: Self-pay | Admitting: Family Medicine

## 2024-02-25 ENCOUNTER — Encounter: Payer: Self-pay | Admitting: Nurse Practitioner

## 2024-02-25 ENCOUNTER — Telehealth: Payer: HMO | Admitting: Nurse Practitioner

## 2024-02-25 ENCOUNTER — Ambulatory Visit: Payer: HMO | Admitting: Orthopedic Surgery

## 2024-02-25 ENCOUNTER — Encounter: Payer: Self-pay | Admitting: Orthopedic Surgery

## 2024-02-25 DIAGNOSIS — K5903 Drug induced constipation: Secondary | ICD-10-CM

## 2024-02-25 DIAGNOSIS — M1712 Unilateral primary osteoarthritis, left knee: Secondary | ICD-10-CM | POA: Diagnosis not present

## 2024-02-25 NOTE — Telephone Encounter (Signed)
 Had a VV today with Lauren. Dm/cma
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
## 2024-02-25 NOTE — Assessment & Plan Note (Signed)
 He has been experiencing constipation for last 5 days since increasing his Mounjaro dose to 7.5 mg injection weekly.  He notes that he took MiraLAX and magnesium citrate today with no bowel movement.  He denies vomiting, and was able to eat cereal and soup today.  Discussed that he can take MiraLAX twice a day until he has a bowel movement.  He can also try a fleets enema today or tomorrow since he is really uncomfortable.  Discussed ER precautions.  Also discussed that after he is able to have a bowel movement, he may want to take MiraLAX twice a week to help with bowel movements while on Mounjaro.  Encourage fluids.

## 2024-02-25 NOTE — Progress Notes (Signed)
 Procedure note for injection of hyaluronic acid   Chief Complaint  Patient presents with   Injections    Left knee Orthovisc     Diagnosis osteoarthritis of the knee  Verbal consent was obtained to inject the knee with HYALURONIC ACID . Timeout was completed to confirm the injection site as the left knee   knee  Ethyl chloride spray was used for anesthesia Alcohol was used to prep the skin. The infrapatellar lateral portal was used as an injection site and 1 vial of hyaluronic acid  was injected into the knee  Specific Co. Preparation: Orthovisc  No complications were noted

## 2024-02-25 NOTE — Telephone Encounter (Signed)
 Chief Complaint: constipation Symptoms: bloating, constipation, abdominal pain, nausea Frequency: 1 week Pertinent Negatives: Patient denies vomiting, fever Disposition: [] ED /[] Urgent Care (no appt availability in office) / [x] Appointment(In office/virtual)/ []  [facility]/ [] Home Care/ [] Refused Recommended Disposition /[] [facility]/ []  Follow-up with PCP Additional Notes: Patient calls reporting constipation x1 week. Patient reports that he has taken mag citrate and miralax today and ducolax previously with no relief. Reports he has passed very small amounts of stool. Per protocol, patient to be evaluated within 4 hours. Per protocol, patient to be evaluated within 24 hours. First available appointment with PCP or any provider in clinic outside of rec. Patient scheduled with first available VV with any provider at patient request for today at 1600. Care advice reviewed, patient verbalized understanding and denies further questions at this time. Alerting PCP for review.     Copied from CRM [id number]. Topic: Clinical - Red Word Triage >> Feb 25, 2024  3:28 PM Chantha C wrote: Red Word that prompted transfer to Nurse Triage: Patient states severe constipated, has not had a bowel movement since last week and tried a few things with no help. Symptoms are abdomen pain,nausea off and on, fatigue, no fever nor dizziness. Patient wants to speak with a nurse, please advise [phone]. Reason for Disposition  Abdomen is more swollen than usual  Answer Assessment - Initial Assessment Questions 1. STOOL PATTERN OR FREQUENCY: "How often do you have a bowel movement (BM)?"  (Normal range: 3 times a day to every 3 days)  "When was your last BM?"       Last "decent" BM last weekend, has taken mag citrate and miralax without relief today. 2. STRAINING: "Do you have to strain to have a BM?"      Yes 3. RECTAL PAIN: "Does your rectum hurt when the stool comes out?" If Yes, ask: "Do you  have hemorrhoids? How bad is the pain?"  (Scale 1-10; or mild, moderate, severe)     Denies 4. STOOL COMPOSITION: "Are the stools hard?"      Yes 5. BLOOD ON STOOLS: "Has there been any blood on the toilet tissue or on the surface of the BM?" If Yes, ask: "When was the last time?"     Denies 6. CHRONIC CONSTIPATION: "Is this a new problem for you?"  If No, ask: "How long have you had this problem?" (days, weeks, months)      Has occurred in the past, reports last event was several months ago. 7. CHANGES IN DIET OR HYDRATION: "Have there been any recent changes in your diet?" "How much fluids are you drinking on a daily basis?"  "How much have you had to drink today?"     Denies any changes, trying to drink but it is difficult due to fullness 8. MEDICINES: "Have you been taking any new medicines?" "Are you taking any narcotic pain medicines?" (e.g., Dilaudid, morphine, Percocet, Vicodin)     Monjauro- 5 weeks ago 9. LAXATIVES: "Have you been using any stool softeners, laxatives, or enemas?"  If Yes, ask "What, how often, and when was the last time?"     Mag citrate, Miralax 10. ACTIVITY:  "How much walking do you do every day?"  "Has your activity level decreased in the past week?"        No changes in activity 11. CAUSE: "What do you think is causing the constipation?"        Unsure, maybe monjuaro 12. OTHER SYMPTOMS: "Do you have  any other symptoms?" (e.g., abdomen pain, bloating, fever, vomiting)       Nausea, abd pain, bloating 13. MEDICAL HISTORY: "Do you have a history of hemorrhoids, rectal fissures, or rectal surgery or rectal abscess?"         Fissure, 20 years ago, hemorrhoids long ago  Protocols used: Constipation-A-AH

## 2024-02-25 NOTE — Patient Instructions (Signed)
 It was great to see you!  Start taking miralax twice a day  You can try a fleets enema once to see if this helps with the bowel movement  If you are unable to have a bowel movement in the next few days or develop vomiting, go to the ER.  After you are able to have a bowel movement, I recommend taking MiraLAX at least twice a week to help prevent constipation while on Mounjaro  Let's follow-up if symptoms worsen or don't improve.   Take care,  Rodman Pickle, NP

## 2024-02-28 ENCOUNTER — Other Ambulatory Visit (HOSPITAL_COMMUNITY): Payer: Self-pay

## 2024-02-28 ENCOUNTER — Telehealth: Payer: Self-pay

## 2024-02-28 ENCOUNTER — Telehealth: Payer: Self-pay | Admitting: Family Medicine

## 2024-02-28 NOTE — Telephone Encounter (Signed)
 Copied from CRM [id number]. Topic: Clinical - Medical Advice >> Feb 28, 2024  1:00 PM Corin V wrote: Reason for CRM: [employer] is needing additional clinicals for the [facility] prior authorization. Please call back to provide the most recent A1C level and diagnosis used for the medication. Please call [phone] option 2  Information was provided to representative from [employer] and recent office note and ALC were faxed

## 2024-02-28 NOTE — Telephone Encounter (Signed)
 Pharmacy Patient Advocate Encounter   Received notification from  [facility] Portal that prior authorization for Ondansetron 4MG  dispersible tablets is required/requested.   Insurance verification completed.   The patient is insured through [facility] ADVANTAGE/RX ADVANCE .   Per test claim: PA required; PA submitted to above mentioned insurance via CoverMyMeds Key/confirmation #/EOC [id number] Status is pending

## 2024-02-28 NOTE — Telephone Encounter (Signed)
 Received form from [employer].  Filled it out and faxed last labs and OV note to [phone].  Dm/cma

## 2024-02-28 NOTE — Telephone Encounter (Signed)
 Copied from CRM [id number]. Topic: Referral - Request for Referral >> Feb 28, 2024 12:22 PM Corin V wrote: Did the patient discuss referral with their provider in the last year? No (If No - schedule appointment) (If Yes - send message)  Appointment offered? No  Type of order/referral and detailed reason for visit: colonoscopy, has been 4-5 years since his last one.  Preference of office, provider, location: n/a  If referral order, have you been seen by this specialty before? No (If Yes, this issue or another issue? When? Where?  Can we respond through MyChart? Yes

## 2024-02-29 NOTE — Telephone Encounter (Signed)
 Left VM that we can take care of the referral on scheduled appointment on 03/03/24. Dm/cma

## 2024-02-29 NOTE — Telephone Encounter (Signed)
 PA approved from 02/28/24 - 03-03/26.  Dm/cma

## 2024-03-02 DIAGNOSIS — E119 Type 2 diabetes mellitus without complications: Secondary | ICD-10-CM | POA: Diagnosis not present

## 2024-03-02 LAB — HM DIABETES EYE EXAM

## 2024-03-03 ENCOUNTER — Encounter: Payer: Self-pay | Admitting: Pulmonary Disease

## 2024-03-03 ENCOUNTER — Encounter: Payer: Self-pay | Admitting: Family Medicine

## 2024-03-03 ENCOUNTER — Ambulatory Visit: Payer: BC Managed Care – PPO | Admitting: Pulmonary Disease

## 2024-03-03 ENCOUNTER — Ambulatory Visit: Admitting: Family Medicine

## 2024-03-03 VITALS — BP 122/64 | HR 74 | Temp 98.1°F | Ht 71.0 in | Wt 304.4 lb

## 2024-03-03 VITALS — BP 120/72 | HR 63 | Temp 98.3°F | Ht 71.0 in | Wt 304.0 lb

## 2024-03-03 DIAGNOSIS — F5104 Psychophysiologic insomnia: Secondary | ICD-10-CM | POA: Diagnosis not present

## 2024-03-03 DIAGNOSIS — Z7984 Long term (current) use of oral hypoglycemic drugs: Secondary | ICD-10-CM

## 2024-03-03 DIAGNOSIS — Z860100 Personal history of colon polyps, unspecified: Secondary | ICD-10-CM | POA: Diagnosis not present

## 2024-03-03 DIAGNOSIS — Z87891 Personal history of nicotine dependence: Secondary | ICD-10-CM | POA: Diagnosis not present

## 2024-03-03 DIAGNOSIS — F3110 Bipolar disorder, current episode manic without psychotic features, unspecified: Secondary | ICD-10-CM

## 2024-03-03 DIAGNOSIS — Z1211 Encounter for screening for malignant neoplasm of colon: Secondary | ICD-10-CM

## 2024-03-03 DIAGNOSIS — E1140 Type 2 diabetes mellitus with diabetic neuropathy, unspecified: Secondary | ICD-10-CM

## 2024-03-03 DIAGNOSIS — G4733 Obstructive sleep apnea (adult) (pediatric): Secondary | ICD-10-CM | POA: Diagnosis not present

## 2024-03-03 DIAGNOSIS — Z6841 Body Mass Index (BMI) 40.0 and over, adult: Secondary | ICD-10-CM | POA: Diagnosis not present

## 2024-03-03 DIAGNOSIS — Z794 Long term (current) use of insulin: Secondary | ICD-10-CM

## 2024-03-03 DIAGNOSIS — Z7985 Long-term (current) use of injectable non-insulin antidiabetic drugs: Secondary | ICD-10-CM

## 2024-03-03 MED ORDER — TRIAZOLAM 0.25 MG PO TABS: 0.2500 mg | ORAL_TABLET | Freq: Every evening | ORAL | 0 refills | Status: DC | PRN

## 2024-03-03 NOTE — Assessment & Plan Note (Signed)
 Maximum weight: 325 lbs (04/2023) Current weight: 304 lbs Weight change since last visit: - 16 lbs Total weight loss: - 19 lbs (5.8%)  Gary Grimes has obesity with multiple serious co-morbidities. He did not tolerate phentermine due to increased blood pressure.We will continue tirzepatide (Mounjaro) 7.5 mg weekly for diabetes management and weight loss.
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
## 2024-03-03 NOTE — Assessment & Plan Note (Signed)
 Diabetes is improving. Continue  U-500 and Tresiba as managed by Dr. Elvera Lennox. Continue tirzepatide 7.5 mg weekly.

## 2024-03-03 NOTE — Patient Instructions (Signed)
 We will try Halcion 0.25 -14-day trial to see if it helps -If it does not help or you are having agitation or other side effects, let us stop it  -Will place a refill for your Belsomra in that case  -Make sure you monitor your symptoms closely  Continue using your CPAP -Download from the machine shows it is working well  Continue weight loss efforts  Follow-up in 3 months
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
## 2024-03-10 ENCOUNTER — Other Ambulatory Visit: Payer: Self-pay

## 2024-03-10 ENCOUNTER — Emergency Department (HOSPITAL_BASED_OUTPATIENT_CLINIC_OR_DEPARTMENT_OTHER)
Admission: EM | Admit: 2024-03-10 | Discharge: 2024-03-10 | Disposition: A | Discharge status: Home / Self Care | Attending: Emergency Medicine | Admitting: Emergency Medicine

## 2024-03-10 ENCOUNTER — Encounter (HOSPITAL_BASED_OUTPATIENT_CLINIC_OR_DEPARTMENT_OTHER): Payer: Self-pay | Admitting: Emergency Medicine

## 2024-03-10 DIAGNOSIS — M545 Low back pain, unspecified: Secondary | ICD-10-CM | POA: Diagnosis not present

## 2024-03-10 DIAGNOSIS — Z7901 Long term (current) use of anticoagulants: Secondary | ICD-10-CM | POA: Diagnosis not present

## 2024-03-10 DIAGNOSIS — Z7982 Long term (current) use of aspirin: Secondary | ICD-10-CM | POA: Diagnosis not present

## 2024-03-10 DIAGNOSIS — M542 Cervicalgia: Secondary | ICD-10-CM | POA: Insufficient documentation

## 2024-03-10 DIAGNOSIS — Y92410 Unspecified street and highway as the place of occurrence of the external cause: Secondary | ICD-10-CM | POA: Insufficient documentation

## 2024-03-10 DIAGNOSIS — M549 Dorsalgia, unspecified: Secondary | ICD-10-CM

## 2024-03-10 DIAGNOSIS — M546 Pain in thoracic spine: Secondary | ICD-10-CM | POA: Insufficient documentation

## 2024-03-10 MED ORDER — LIDOCAINE 5 % EX PTCH: 1.0000 | MEDICATED_PATCH | CUTANEOUS | 0 refills | Status: AC

## 2024-03-10 MED ORDER — TRAMADOL HCL 50 MG PO TABS: 50.0000 mg | ORAL_TABLET | Freq: Four times a day (QID) | ORAL | 0 refills | Status: DC | PRN

## 2024-03-10 MED ORDER — METHOCARBAMOL 500 MG PO TABS: 1000.0000 mg | ORAL_TABLET | Freq: Four times a day (QID) | ORAL | 0 refills | Status: AC
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
## 2024-03-10 NOTE — ED Notes (Signed)
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
## 2024-03-16 ENCOUNTER — Telehealth: Payer: Self-pay

## 2024-03-16 ENCOUNTER — Other Ambulatory Visit: Payer: Self-pay | Admitting: Pulmonary Disease

## 2024-03-16 DIAGNOSIS — Z794 Long term (current) use of insulin: Secondary | ICD-10-CM

## 2024-03-16 MED ORDER — TIRZEPATIDE 10 MG/0.5ML ~~LOC~~ SOAJ: 10.0000 mg | SUBCUTANEOUS | 3 refills | Status: DC

## 2024-03-16 NOTE — Telephone Encounter (Signed)
 Patient requesting refill on Belsomra 25mg   LOV:03/03/2024

## 2024-03-16 NOTE — Telephone Encounter (Signed)
**Note De-identified  [place] Obfuscation** Please advise 

## 2024-03-16 NOTE — Telephone Encounter (Signed)
 Patient would like to get the Mounjaro 10 mg sent into the pharmacy.    Please review and advise.  Thanks. Dm/cma

## 2024-03-16 NOTE — Telephone Encounter (Signed)
 Copied from CRM [id number]. Topic: Clinical - Medication Question >> Mar 16, 2024 10:19 AM Elizebeth Brooking wrote: Reason for CRM: Patient called in regarding his prescription Mounjaro, stated his next dose is 10 MG, would like that sent over to the pharmacy  [facility] - [place], [place] - [address] [place]  [address] Phone: [phone] Fax: [phone] Would like for someone to give her a call when has been sent

## 2024-03-16 NOTE — Telephone Encounter (Signed)
 [facility]  Needs Belsomra refill  PT in the past has been upset we did not fill this in a timely manner (See past request in tel encounters) Please send to DOD if Dr. Val Eagle is not avail. TY.   His # is [phone]

## 2024-03-16 NOTE — Addendum Note (Signed)
 Addended by: Loyola Mast on: 03/16/2024 04:17 PM   Modules accepted: Orders

## 2024-03-17 ENCOUNTER — Other Ambulatory Visit: Payer: Self-pay | Admitting: Family Medicine

## 2024-03-17 DIAGNOSIS — R110 Nausea: Secondary | ICD-10-CM

## 2024-03-17 MED ORDER — ONDANSETRON 4 MG PO TBDP: 4.0000 mg | ORAL_TABLET | Freq: Three times a day (TID) | ORAL | 2 refills | Status: AC | PRN

## 2024-03-17 NOTE — Telephone Encounter (Signed)
 Requesting: ondansetron (ZOFRAN-ODT) 4 MG disintegrating tablet  Last Visit: 03/03/2024 Next Visit: 04/20/2024 Last Refill: 02/03/2024  Please Advise

## 2024-03-17 NOTE — Telephone Encounter (Signed)
 Copied from CRM [id number]. Topic: Clinical - Medication Refill >> Mar 17, 2024  8:19 AM Lennart Pall wrote: Most Recent Primary Care Visit:  Provider: Loyola Mast  Department: [facility]  Visit Type: OFFICE VISIT  Date: 03/03/2024  Medication: ondansetron (ZOFRAN-ODT) 4 MG disintegrating tablet  Has the patient contacted their pharmacy? Yes (Agent: If no, request that the patient contact the pharmacy for the refill. If patient does not wish to contact the pharmacy document the reason why and proceed with request.) (Agent: If yes, when and what did the pharmacy advise?)  Is this the correct pharmacy for this prescription? Yes If no, delete pharmacy and type the correct one.  This is the patient's preferred pharmacy:  [facility] - [place], [place] - [address] [place] [address] Phone: [phone] Fax: [phone]    Has the prescription been filled recently? Yes  Is the patient out of the medication? Yes  Has the patient been seen for an appointment in the last year OR does the patient have an upcoming appointment? Yes  Can we respond through MyChart? Yes  Agent: Please be advised that Rx refills may take up to 3 business days. We ask that you follow-up with your pharmacy.

## 2024-03-19 ENCOUNTER — Other Ambulatory Visit: Payer: Self-pay | Admitting: Pulmonary Disease

## 2024-03-19 MED ORDER — BELSOMRA 20 MG PO TABS: 20.0000 mg | ORAL_TABLET | Freq: Every evening | ORAL | 1 refills | Status: DC

## 2024-03-20 ENCOUNTER — Telehealth: Payer: Self-pay | Admitting: Internal Medicine

## 2024-03-20 NOTE — Telephone Encounter (Signed)
 Looks like his next colonoscopy can be due in 07/2026 based upon the results of his last colonoscopy. We can put in for a recall at that time.  Colonoscopy 08/10/19:  - No endoscopically- evident cause for patient' s diarrhea or abdominal pain seen. - One 5 mm polyp in the ascending colon, removed with a cold snare. Resected and retrieved. - One 2 mm polyp at the hepatic flexure. Biopsied. - Diverticulosis in the sigmoid colon. - The examined portion of the ileum was normal. Biopsied. - The distal rectum and anal verge are normal on retroflexion view. - Non- bleeding non- prolapsed internal hemorrhoids. - Biopsies were taken with a cold forceps from the entire colon for evaluation of possible microscopic colitis. Path: 1. Ileum, biopsy, terminal - ILEAL MUCOSA WITH NO SIGNIFICANT PATHOLOGIC FINDINGS. - NEGATIVE FOR ACTIVE INFLAMMATION AND OTHER ABNORMALITIES. 2. Colon, biopsy, random - COLONIC MUCOSA WITH NO SIGNIFICANT PATHOLOGIC FINDINGS. 3. Colon, polyp(s), ascending - TUBULAR ADENOMA. - NEGATIVE FOR HIGH GRADE DYSPLASIA. 4. Colon, polyp(s), transverse - HYPERPLASTIC POLYP.

## 2024-03-20 NOTE — Telephone Encounter (Signed)
 Good morning Dr Leonides Schanz   Doc of the Day   We received a referral for patient to be seen for a colonoscopy.   Last procedure in 07/2019 with Dr Huntley Dec.   Records available for review in epic. Please review and advise on scheduling.   Requesting transfer due to previous provider no longer accepting insurance.   Please review and advise on scheduling   Thank you

## 2024-03-21 ENCOUNTER — Ambulatory Visit: Payer: HMO | Admitting: Physical Therapy

## 2024-03-22 NOTE — Telephone Encounter (Signed)
 Refilled NFN

## 2024-03-23 ENCOUNTER — Telehealth: Payer: Self-pay

## 2024-03-23 ENCOUNTER — Other Ambulatory Visit (HOSPITAL_COMMUNITY): Payer: Self-pay

## 2024-03-23 NOTE — Telephone Encounter (Signed)
 Can we start/submit prior authorization for STRATTERA  Forwarding message below.

## 2024-03-23 NOTE — Telephone Encounter (Signed)
 Copied from CRM [id number]. Topic: Clinical - Medication Question >> Mar 23, 2024 11:59 AM Adaysia C wrote: Reason for CRM: Patient has requested for the provider to submit a prior authorization for the medication STRATTERA to his insurance; please follow up with patient if necessary [phone]

## 2024-03-23 NOTE — Telephone Encounter (Signed)
 Pharmacy Patient Advocate Encounter   Received notification from Pt Calls Messages that prior authorization for Atomoxetine 40mg  caps is required/requested.   Insurance verification completed.   The patient is insured through [facility] ADV[employer]AGE/RX ADV[employer] .   Per test claim: PA required; PA submitted to above mentioned insurance via CoverMyMeds Key/confirmation #/EOC [id number] Status is pending

## 2024-03-24 NOTE — Telephone Encounter (Signed)
 Advised patient that his next colonoscopy is not until 07/2026 and a recall will be put in for the information

## 2024-03-27 ENCOUNTER — Other Ambulatory Visit (HOSPITAL_COMMUNITY): Payer: Self-pay

## 2024-03-27 ENCOUNTER — Encounter: Payer: Self-pay | Admitting: Family Medicine

## 2024-03-27 ENCOUNTER — Telehealth: Payer: Self-pay

## 2024-03-27 ENCOUNTER — Ambulatory Visit (INDEPENDENT_AMBULATORY_CARE_PROVIDER_SITE_OTHER): Admitting: Family Medicine

## 2024-03-27 VITALS — BP 132/70 | HR 71 | Temp 98.8°F | Ht 71.0 in | Wt 293.8 lb

## 2024-03-27 DIAGNOSIS — S39012D Strain of muscle, fascia and tendon of lower back, subsequent encounter: Secondary | ICD-10-CM | POA: Diagnosis not present
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
## 2024-03-27 NOTE — Telephone Encounter (Signed)
 Pharmacy Patient Advocate Encounter  Received notification from [facility] ADVANTAGE/RX ADVANCE that Prior Authorization for Atomoxetine HCl 40MG  capsules has been APPROVED from 03/24/24 to 12/27/24. Unable to obtain price due to refill too soon rejection, last fill date 03/25/24 next available fill date04/21/25   PA #/Case ID/Reference #:  [id number]
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
## 2024-03-31 ENCOUNTER — Other Ambulatory Visit: Payer: Self-pay | Admitting: Pulmonary Disease

## 2024-03-31 ENCOUNTER — Telehealth: Payer: Self-pay | Admitting: Pulmonary Disease

## 2024-03-31 NOTE — Telephone Encounter (Signed)
 Fax sent for meds to [facility]. Confirmation rec'd.

## 2024-03-31 NOTE — Telephone Encounter (Signed)
**Note De-identified  [place] Obfuscation** Please advise 

## 2024-04-03 ENCOUNTER — Telehealth (HOSPITAL_COMMUNITY): Payer: Self-pay

## 2024-04-03 ENCOUNTER — Encounter (HOSPITAL_COMMUNITY): Payer: Self-pay

## 2024-04-03 DIAGNOSIS — I480 Paroxysmal atrial fibrillation: Secondary | ICD-10-CM

## 2024-04-03 DIAGNOSIS — Z01812 Encounter for preprocedural laboratory examination: Secondary | ICD-10-CM

## 2024-04-03 NOTE — Telephone Encounter (Signed)
 Spoke with patient to complete pre-procedure call.     New medical conditions?  No Recent hospitalizations or surgeries? No Started any new medications? No Patient made aware to contact office to inform of any new medications started. Any changes in activities of daily living? No  Pre-procedure testing scheduled: CT on 04/05/24 and lab work ordered Confirmed patient is taking Eliquis and will continue taking medication before procedure or it may need to be rescheduled. Last dose of Mounjaro will be on 04/14/24.  Confirmed patient is scheduled for Atrial Fibrillation Ablation on Thursday, May 1 with Dr. Loman Brooklyn. Instructed patient to arrive at the Main Entrance A at [facility]: [address] [place], [place] [address] and check in at Admitting at 8:00 AM  Advised of plan to go home the same day and will only stay overnight if medically necessary. You MUST have a responsible adult to drive you home and MUST be with you the first 24 hours after you arrive home or your procedure could be cancelled.  Patient verbalized understanding to information provided and is agreeable to proceed with procedure.

## 2024-04-04 DIAGNOSIS — I480 Paroxysmal atrial fibrillation: Secondary | ICD-10-CM | POA: Diagnosis not present

## 2024-04-04 DIAGNOSIS — Z01812 Encounter for preprocedural laboratory examination: Secondary | ICD-10-CM | POA: Diagnosis not present

## 2024-04-05 ENCOUNTER — Ambulatory Visit (HOSPITAL_COMMUNITY)
Admission: RE | Admit: 2024-04-05 | Discharge: 2024-04-05 | Disposition: A | Discharge status: Home / Self Care | Source: Ambulatory Visit | Attending: Cardiology | Admitting: Cardiology

## 2024-04-05 DIAGNOSIS — I480 Paroxysmal atrial fibrillation: Secondary | ICD-10-CM | POA: Diagnosis not present

## 2024-04-05 LAB — POCT I-STAT CREATININE: Creatinine, Ser: 1.3 mg/dL — ABNORMAL HIGH (ref 0.61–1.24)

## 2024-04-05 LAB — CBC
Hematocrit: 42.3 % (ref 37.5–51.0)
Hemoglobin: 14.5 g/dL (ref 13.0–17.7)
MCH: 31.9 pg (ref 26.6–33.0)
MCHC: 34.3 g/dL (ref 31.5–35.7)
MCV: 93 fL (ref 79–97)
Platelets: 322 10*3/uL (ref 150–450)
RBC: 4.54 x10E6/uL (ref 4.14–5.80)
RDW: 13.6 % (ref 11.6–15.4)
WBC: 7.7 10*3/uL (ref 3.4–10.8)

## 2024-04-05 LAB — BASIC METABOLIC PANEL WITH GFR
BUN/Creatinine Ratio: 17 (ref 10–24)
BUN: 22 mg/dL (ref 8–27)
CO2: 18 mmol/L — ABNORMAL LOW (ref 20–29)
Calcium: 9.9 mg/dL (ref 8.6–10.2)
Chloride: 103 mmol/L (ref 96–106)
Creatinine, Ser: 1.29 mg/dL — ABNORMAL HIGH (ref 0.76–1.27)
Glucose: 251 mg/dL — ABNORMAL HIGH (ref 70–99)
Potassium: 4.5 mmol/L (ref 3.5–5.2)
Sodium: 137 mmol/L (ref 134–144)
eGFR: 62 mL/min/{1.73_m2} (ref >59)

## 2024-04-05 MED ORDER — IOHEXOL 350 MG/ML SOLN
95.0000 mL | Freq: Once | INTRAVENOUS | Status: AC | PRN
  Administered 2024-04-05: 95 mL via INTRAVENOUS

## 2024-04-12 ENCOUNTER — Encounter: Admitting: Family Medicine

## 2024-04-17 ENCOUNTER — Other Ambulatory Visit: Payer: Self-pay | Admitting: Family Medicine

## 2024-04-17 DIAGNOSIS — E1140 Type 2 diabetes mellitus with diabetic neuropathy, unspecified: Secondary | ICD-10-CM

## 2024-04-17 MED ORDER — TIRZEPATIDE 10 MG/0.5ML ~~LOC~~ SOAJ: 10.0000 mg | SUBCUTANEOUS | 3 refills | Status: DC

## 2024-04-17 NOTE — Telephone Encounter (Signed)
 Copied from CRM [id number]. Topic: Clinical - Medication Refill >> Apr 17, 2024  9:22 AM Palma Bob wrote: Most Recent Primary Care Visit:  Provider: Graig Lawyer  Department: [facility]  Visit Type: HOSPITAL FOLLOW UP  Date: 03/27/2024  Medication: tirzepatide (MOUNJARO) 10 MG/0.5ML Pen  Has the patient contacted their pharmacy? No (Agent: If no, request that the patient contact the pharmacy for the refill. If patient does not wish to contact the pharmacy document the reason why and proceed with request.) (Agent: If yes, when and what did the pharmacy advise?)  Is this the correct pharmacy for this prescription? Yes If no, delete pharmacy and type the correct one.  This is the patient's preferred pharmacy:  [facility] [place], [place] - [address] [place] [address] Phone: [phone] Fax: [phone]    Has the prescription been filled recently? No  Is the patient out of the medication? Yes  Has the patient been seen for an appointment in the last year OR does the patient have an upcoming appointment? Yes  Can we respond through MyChart? Yes  Agent: Please be advised that Rx refills may take up to 3 business days. We ask that you follow-up with your pharmacy.

## 2024-04-18 ENCOUNTER — Telehealth: Payer: Self-pay

## 2024-04-18 NOTE — Telephone Encounter (Signed)
*  Pulm  Pharmacy Patient Advocate Encounter   Received notification from CoverMyMeds that prior authorization for Triazolam  0.25MG  tablets  is required/requested.   Insurance verification completed.   The patient is insured through [facility] ADVANTAGE/RX ADVANCE .   Per test claim: PA required; PA submitted to above mentioned insurance via CoverMyMeds Key/confirmation #/EOC [id number] Status is pending

## 2024-04-18 NOTE — Pre-Procedure Instructions (Signed)
 Spoke with patient regarding procedure instructions for procedure on 04/27/24 with anesthesia.  Anesthesia requires Mounjaro to be held for 7 days before procedure.  His last dose was 04/14/24, he is aware to not take anymore till after procedure.

## 2024-04-19 NOTE — Telephone Encounter (Signed)
 Pharmacy Patient Advocate Encounter  Received notification from [facility] ADVANTAGE/RX ADVANCE that Prior Authorization for Triazolam  0.25mg  has been APPROVED from 04/18/2024 to 12/27/2024

## 2024-04-20 ENCOUNTER — Encounter: Payer: Self-pay | Admitting: Family Medicine

## 2024-04-20 ENCOUNTER — Telehealth (HOSPITAL_COMMUNITY): Payer: Self-pay

## 2024-04-20 ENCOUNTER — Ambulatory Visit: Admitting: Family Medicine

## 2024-04-20 VITALS — BP 120/68 | HR 80 | Temp 97.8°F | Ht 71.0 in | Wt 291.0 lb

## 2024-04-20 DIAGNOSIS — Z0000 Encounter for general adult medical examination without abnormal findings: Secondary | ICD-10-CM

## 2024-04-20 NOTE — Telephone Encounter (Signed)
 Call placed to patient to discuss upcoming procedure.   CT: completed.  Labs: completed.   Any recent signs of acute illness or been started on antibiotics? No Any new medications started? No Any medications to hold? Hold Mounjaro 7 days- last dose 04/14/24. Follow instructions letter regarding Insulin . Any missed doses of blood thinner?  No Advised patient to continue taking ANTICOAGULANT: Eliquis  (Apixaban ) twice daily without missing any doses.  Medication instructions:  On the morning of your procedure DO NOT take any medication., including Eliquis  or the procedure may be rescheduled. Nothing to eat or drink after midnight prior to your procedure.  Confirmed patient is scheduled for Atrial Fibrillation Ablation on Thursday, May 1 with Dr. Agatha Horsfall. Instructed patient to arrive at the Main Entrance A at [facility]: [address] [place], [place] [address] and check in at Admitting at 8:00 AM.   Advised of plan to go home the same day and will only stay overnight if medically necessary. You MUST have a responsible adult to drive you home and MUST be with you the first 24 hours after you arrive home or your procedure could be cancelled.  Patient verbalized understanding to all instructions provided and agreed to proceed with procedure.

## 2024-04-20 NOTE — Patient Instructions (Signed)
  Mr. Gary Grimes , Thank you for taking time to come for your Medicare Wellness Visit. I appreciate your ongoing commitment to your health goals. Please review the following plan we discussed and let me know if I can assist you in the future.   These are the goals we discussed: Regular exercise 4-5 days a week.   This is a list of the screening recommended for you and due dates:  Health Maintenance  Topic Date Due   COVID-19 Vaccine (3 - Pfizer risk series) 05/15/2020   Flu Shot  07/28/2024   Hemoglobin A1C  08/10/2024   Yearly kidney health urinalysis for diabetes  11/10/2024   Complete foot exam   11/10/2024   Eye exam for diabetics  03/02/2025   Yearly kidney function blood test for diabetes  04/04/2025   Medicare Annual Wellness Visit  04/20/2025   Colon Cancer Screening  08/09/2029   DTaP/Tdap/Td vaccine (2 - Td or Tdap) 07/12/2031   Pneumonia Vaccine  Completed   Hepatitis C Screening  Completed   HIV Screening  Completed   Zoster (Shingles) Vaccine  Completed   HPV Vaccine  Aged Out   Meningitis B Vaccine  Aged Out
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
## 2024-04-26 ENCOUNTER — Ambulatory Visit

## 2024-04-26 NOTE — Pre-Procedure Instructions (Addendum)
 Attempted to call patient regarding procedure instructions.  Left voicemail on the following.  Arrival time 0800 Nothing to eat or drink after midnight No meds AM of procedure Responsible person to drive you home and stay with you for 24 hrs  Have you missed any doses of anti-coagulant Eliquis - should be taken twice a day, if you have missed any doses please let us  know.  Don't take dose morning of procedure.  You should have been holding Mounjaro for last 7 days.    Patient called back and all above information was reviewed with patient.

## 2024-04-27 ENCOUNTER — Ambulatory Visit (HOSPITAL_COMMUNITY): Payer: Self-pay | Admitting: Anesthesiology

## 2024-04-27 ENCOUNTER — Ambulatory Visit (HOSPITAL_BASED_OUTPATIENT_CLINIC_OR_DEPARTMENT_OTHER): Payer: Self-pay | Admitting: Anesthesiology

## 2024-04-27 ENCOUNTER — Ambulatory Visit (HOSPITAL_COMMUNITY)
Admission: RE | Disposition: A | Discharge status: Home / Self Care | Payer: Self-pay | Source: Home / Self Care | Attending: Cardiology

## 2024-04-27 ENCOUNTER — Encounter (HOSPITAL_COMMUNITY): Payer: Self-pay | Admitting: Cardiology

## 2024-04-27 ENCOUNTER — Other Ambulatory Visit: Payer: Self-pay

## 2024-04-27 ENCOUNTER — Ambulatory Visit (HOSPITAL_COMMUNITY)
Admission: RE | Admit: 2024-04-27 | Discharge: 2024-04-27 | Disposition: A | Discharge status: Home / Self Care | Attending: Cardiology | Admitting: Cardiology

## 2024-04-27 DIAGNOSIS — I4891 Unspecified atrial fibrillation: Secondary | ICD-10-CM

## 2024-04-27 DIAGNOSIS — E119 Type 2 diabetes mellitus without complications: Secondary | ICD-10-CM | POA: Diagnosis not present

## 2024-04-27 DIAGNOSIS — Z6839 Body mass index (BMI) 39.0-39.9, adult: Secondary | ICD-10-CM | POA: Insufficient documentation

## 2024-04-27 DIAGNOSIS — I10 Essential (primary) hypertension: Secondary | ICD-10-CM

## 2024-04-27 DIAGNOSIS — E785 Hyperlipidemia, unspecified: Secondary | ICD-10-CM | POA: Insufficient documentation

## 2024-04-27 DIAGNOSIS — I4819 Other persistent atrial fibrillation: Secondary | ICD-10-CM | POA: Insufficient documentation

## 2024-04-27 DIAGNOSIS — E66813 Obesity, class 3: Secondary | ICD-10-CM | POA: Diagnosis not present

## 2024-04-27 DIAGNOSIS — K219 Gastro-esophageal reflux disease without esophagitis: Secondary | ICD-10-CM | POA: Diagnosis not present

## 2024-04-27 DIAGNOSIS — I483 Typical atrial flutter: Secondary | ICD-10-CM | POA: Diagnosis not present

## 2024-04-27 DIAGNOSIS — G4733 Obstructive sleep apnea (adult) (pediatric): Secondary | ICD-10-CM | POA: Diagnosis not present

## 2024-04-27 DIAGNOSIS — G4730 Sleep apnea, unspecified: Secondary | ICD-10-CM | POA: Diagnosis not present

## 2024-04-27 DIAGNOSIS — Z79899 Other long term (current) drug therapy: Secondary | ICD-10-CM | POA: Insufficient documentation

## 2024-04-27 DIAGNOSIS — I2510 Atherosclerotic heart disease of native coronary artery without angina pectoris: Secondary | ICD-10-CM | POA: Diagnosis not present

## 2024-04-27 HISTORY — PX: ATRIAL FIBRILLATION ABLATION: EP1191

## 2024-04-27 LAB — GLUCOSE, CAPILLARY
Glucose-Capillary: 187 mg/dL — ABNORMAL HIGH (ref 70–99)
Glucose-Capillary: 166 mg/dL — ABNORMAL HIGH (ref 70–99)

## 2024-04-27 LAB — POCT ACTIVATED CLOTTING TIME: Activated Clotting Time: 256 s

## 2024-04-27 SURGERY — ATRIAL FIBRILLATION ABLATION
Anesthesia: General

## 2024-04-27 MED ORDER — ALBUMIN HUMAN 5 % IV SOLN: INTRAVENOUS | Status: DC | PRN

## 2024-04-27 MED ORDER — VASOPRESSIN 20 UNIT/ML IV SOLN
INTRAVENOUS | Status: DC | PRN
  Administered 2024-04-27: 1 [IU] via INTRAVENOUS

## 2024-04-27 MED ORDER — HEPARIN SODIUM (PORCINE) 1000 UNIT/ML IJ SOLN
INTRAMUSCULAR | Status: AC
  Filled 2024-04-27: qty 20

## 2024-04-27 MED ORDER — HEPARIN (PORCINE) IN NACL 1000-0.9 UT/500ML-% IV SOLN
INTRAVENOUS | Status: DC | PRN
  Administered 2024-04-27 (×3): 500 mL

## 2024-04-27 MED ORDER — LIDOCAINE 2% (20 MG/ML) 5 ML SYRINGE
INTRAMUSCULAR | Status: DC | PRN
  Administered 2024-04-27: 60 mg via INTRAVENOUS

## 2024-04-27 MED ORDER — PHENYLEPHRINE 80 MCG/ML (10ML) SYRINGE FOR IV PUSH (FOR BLOOD PRESSURE SUPPORT)
PREFILLED_SYRINGE | INTRAVENOUS | Status: DC | PRN
  Administered 2024-04-27 (×2): 160 ug via INTRAVENOUS
  Administered 2024-04-27: 80 ug via INTRAVENOUS
  Administered 2024-04-27: 160 ug via INTRAVENOUS
  Administered 2024-04-27: 240 ug via INTRAVENOUS

## 2024-04-27 MED ORDER — SODIUM CHLORIDE 0.9% FLUSH: 3.0000 mL | INTRAVENOUS | Status: DC | PRN

## 2024-04-27 MED ORDER — DEXAMETHASONE SODIUM PHOSPHATE 10 MG/ML IJ SOLN
INTRAMUSCULAR | Status: DC | PRN
  Administered 2024-04-27: 4 mg via INTRAVENOUS

## 2024-04-27 MED ORDER — FENTANYL CITRATE (PF) 100 MCG/2ML IJ SOLN
INTRAMUSCULAR | Status: AC
  Filled 2024-04-27: qty 2

## 2024-04-27 MED ORDER — PROPOFOL 10 MG/ML IV BOLUS
INTRAVENOUS | Status: DC | PRN
  Administered 2024-04-27: 200 mg via INTRAVENOUS

## 2024-04-27 MED ORDER — SODIUM CHLORIDE 0.9 % IV SOLN: 250.0000 mL | INTRAVENOUS | Status: DC | PRN

## 2024-04-27 MED ORDER — MIDAZOLAM HCL 2 MG/2ML IJ SOLN
INTRAMUSCULAR | Status: DC | PRN
  Administered 2024-04-27: 2 mg via INTRAVENOUS

## 2024-04-27 MED ORDER — ACETAMINOPHEN 325 MG PO TABS: 650.0000 mg | ORAL_TABLET | ORAL | Status: DC | PRN

## 2024-04-27 MED ORDER — MIDAZOLAM HCL 2 MG/2ML IJ SOLN
INTRAMUSCULAR | Status: AC
  Filled 2024-04-27: qty 2

## 2024-04-27 MED ORDER — ONDANSETRON HCL 4 MG/2ML IJ SOLN
INTRAMUSCULAR | Status: DC | PRN
  Administered 2024-04-27: 4 mg via INTRAVENOUS

## 2024-04-27 MED ORDER — HEPARIN SODIUM (PORCINE) 1000 UNIT/ML IJ SOLN
INTRAMUSCULAR | Status: AC
  Filled 2024-04-27: qty 30

## 2024-04-27 MED ORDER — PROTAMINE SULFATE 10 MG/ML IV SOLN
INTRAVENOUS | Status: DC | PRN
  Administered 2024-04-27: 40 mg via INTRAVENOUS

## 2024-04-27 MED ORDER — ATROPINE SULFATE 1 MG/10ML IJ SOSY
PREFILLED_SYRINGE | INTRAMUSCULAR | Status: AC
  Filled 2024-04-27: qty 10

## 2024-04-27 MED ORDER — ROCURONIUM BROMIDE 10 MG/ML (PF) SYRINGE
PREFILLED_SYRINGE | INTRAVENOUS | Status: DC | PRN
  Administered 2024-04-27: 10 mg via INTRAVENOUS
  Administered 2024-04-27: 60 mg via INTRAVENOUS
  Administered 2024-04-27: 10 mg via INTRAVENOUS

## 2024-04-27 MED ORDER — ATROPINE SULFATE 1 MG/ML IV SOLN
INTRAVENOUS | Status: DC | PRN
  Administered 2024-04-27: 1 mg via INTRAVENOUS

## 2024-04-27 MED ORDER — HEPARIN SODIUM (PORCINE) 1000 UNIT/ML IJ SOLN
INTRAMUSCULAR | Status: DC | PRN
  Administered 2024-04-27: 15000 [IU] via INTRAVENOUS
  Administered 2024-04-27: 8000 [IU] via INTRAVENOUS

## 2024-04-27 MED ORDER — PHENYLEPHRINE HCL-NACL 20-0.9 MG/250ML-% IV SOLN
INTRAVENOUS | Status: DC | PRN
  Administered 2024-04-27: 25 ug/min via INTRAVENOUS

## 2024-04-27 MED ORDER — SUGAMMADEX SODIUM 200 MG/2ML IV SOLN
INTRAVENOUS | Status: DC | PRN
  Administered 2024-04-27: 260 mg via INTRAVENOUS

## 2024-04-27 MED ORDER — EPHEDRINE SULFATE-NACL 50-0.9 MG/10ML-% IV SOSY
PREFILLED_SYRINGE | INTRAVENOUS | Status: DC | PRN
  Administered 2024-04-27: 5 mg via INTRAVENOUS
  Administered 2024-04-27 (×2): 10 mg via INTRAVENOUS

## 2024-04-27 MED ORDER — FENTANYL CITRATE (PF) 100 MCG/2ML IJ SOLN
INTRAMUSCULAR | Status: DC | PRN
  Administered 2024-04-27: 100 ug via INTRAVENOUS

## 2024-04-27 MED ORDER — SODIUM CHLORIDE 0.9 % IV SOLN: INTRAVENOUS | Status: DC

## 2024-04-27 MED ORDER — ONDANSETRON HCL 4 MG/2ML IJ SOLN: 4.0000 mg | Freq: Four times a day (QID) | INTRAMUSCULAR | Status: DC | PRN

## 2024-04-27 SURGICAL SUPPLY — 21 items
BAG SNAP BAND KOVER 36X36 (MISCELLANEOUS) IMPLANT
BLANKET WARM UNDERBOD FULL ACC (MISCELLANEOUS) ×1 IMPLANT
CABLE PFA RX CATH CONN (CABLE) IMPLANT
CATH EZ STEER NAV 8MM D-F CUR (ABLATOR) IMPLANT
CATH FARAWAVE ABLATION 31 (CATHETERS) IMPLANT
CATH GE 8FR SOUNDSTAR (CATHETERS) IMPLANT
CATH OCTARAY 2.0 F 3-3-3-3-3 (CATHETERS) IMPLANT
CATH WEBSTER BI DIR CS D-F CRV (CATHETERS) IMPLANT
CLOSURE MYNX CONTROL 6F/7F (Vascular Products) IMPLANT
CLOSURE PERCLOSE PROSTYLE (VASCULAR PRODUCTS) IMPLANT
COVER SWIFTLINK CONNECTOR (BAG) ×1 IMPLANT
DILATOR VESSEL 38 20CM 16FR (INTRODUCER) IMPLANT
GUIDEWIRE INQWIRE 1.5J.035X260 (WIRE) IMPLANT
KIT VERSACROSS CNCT FARADRIVE (KITS) IMPLANT
PACK EP LF (CUSTOM PROCEDURE TRAY) ×1 IMPLANT
PAD DEFIB RADIO PHYSIO CONN (PAD) ×1 IMPLANT
PATCH CARTO3 (PAD) IMPLANT
SHEATH FARADRIVE STEERABLE (SHEATH) IMPLANT
SHEATH PINNACLE 8F 10CM (SHEATH) IMPLANT
SHEATH PINNACLE 9F 10CM (SHEATH) IMPLANT
SHEATH PROBE COVER 6X72 (BAG) IMPLANT

## 2024-04-27 NOTE — Anesthesia Preprocedure Evaluation (Signed)
 Anesthesia Evaluation  Patient identified by MRN, date of birth, ID band Patient awake    Reviewed: Allergy & Precautions, H&P , NPO status , Patient's Chart, lab work & pertinent test results  Airway Mallampati: II   Neck ROM: full    Dental   Pulmonary sleep apnea , former smoker   breath sounds clear to auscultation       Cardiovascular hypertension, + CAD  + dysrhythmias Atrial Fibrillation  Rhythm:irregular Rate:Normal     Neuro/Psych  Headaches PSYCHIATRIC DISORDERS Anxiety Depression Bipolar Disorder      GI/Hepatic ,GERD  ,,  Endo/Other  diabetes, Type 2  Class 3 obesity  Renal/GU      Musculoskeletal  (+) Arthritis ,    Abdominal   Peds  Hematology   Anesthesia Other Findings   Reproductive/Obstetrics                             Anesthesia Physical Anesthesia Plan  ASA: 3  Anesthesia Plan: General   Post-op Pain Management:    Induction: Intravenous  PONV Risk Score and Plan: 2 and Ondansetron , Dexamethasone , Treatment may vary due to age or medical condition and Midazolam   Airway Management Planned: Oral ETT  Additional Equipment:   Intra-op Plan:   Post-operative Plan: Extubation in OR  Informed Consent: I have reviewed the patients History and Physical, chart, labs and discussed the procedure including the risks, benefits and alternatives for the proposed anesthesia with the patient or authorized representative who has indicated his/her understanding and acceptance.     Dental advisory given  Plan Discussed with: CRNA, Anesthesiologist and Surgeon  Anesthesia Plan Comments:        Anesthesia Quick Evaluation

## 2024-04-27 NOTE — Transfer of Care (Signed)
 Immediate Anesthesia Transfer of Care Note  Patient: Gary Grimes  Procedure(s) Performed: ATRIAL FIBRILLATION ABLATION  Patient Location: Cath Lab  Anesthesia Type:General  Level of Consciousness:   Airway & Oxygen Therapy: Patient Spontanous Breathing, Patient connected to nasal cannula oxygen, and Patient connected to face mask oxygen  Post-op Assessment: Report given to RN and Post -op Vital signs reviewed and stable awake, alert , and oriented Post vital signs: Reviewed and stable  Last Vitals:  Vitals Value Taken Time  BP 116/49   Temp    Pulse 71   Resp 15   SpO2 96     Last Pain:  Vitals:   04/27/24 0850  TempSrc: Oral         Complications: There were no known notable events for this encounter.

## 2024-04-27 NOTE — Discharge Instructions (Signed)

## 2024-04-27 NOTE — H&P (Signed)
  Electrophysiology Office Note:   Date:  04/27/2024  ID:  Gary Grimes, DOB 12-14-1961, MRN [id number]  Primary Cardiologist: None Primary Heart Failure: None Electrophysiologist: None      History of Present Illness:   Gary Grimes is a 63 y.o. male with h/o atrial fibrillation/flutter, hypertension, hyperlipidemia, sleep apnea seen today for  for Electrophysiology evaluation of atrial fibrillation at the request of [name].    Today, denies symptoms of palpitations, chest pain, shortness of breath, orthopnea, PND, lower extremity edema, claudication, dizziness, presyncope, syncope, bleeding, or neurologic sequela. The patient is tolerating medications without difficulties. Plan ablation today.   EP Information / Studies Reviewed:    EKG is ordered today. Personal review as below.   Atrial fibrillation    Risk Assessment/Calculations:    CHA2DS2-VASc Score = 2   This indicates a 2.2% annual risk of stroke. The patient's score is based upon: CHF History: 0 HTN History: 1 Diabetes History: 0 Stroke History: 0 Vascular Disease History: 1 Age Score: 0 Gender Score: 0            Physical Exam:   VS:  There were no vitals taken for this visit.   Wt Readings from Last 3 Encounters:  04/20/24 132 kg  03/27/24 133.3 kg  03/03/24 (!) 137.9 kg     GEN: No acute distress.   Neck: No JVD Cardiac: RRR, no murmurs, rubs, or gallops.  Respiratory: normal BS bilaterally. GI: Soft, nontender, non-distended  MS: No edema; No deformity. Neuro:  Nonfocal  Skin: warm and dry Psych: Normal affect    ASSESSMENT AND PLAN:    1.  Paroxysmal atrial fibrillation/flutter: Gary Grimes has presented today for surgery, with the diagnosis of AF.  The various methods of treatment have been discussed with the patient and family. After consideration of risks, benefits and other options for treatment, the patient has consented to  Procedure(s): Catheter ablation as a surgical  intervention .  Risks include but not limited to complete heart block, stroke, esophageal damage, nerve damage, bleeding, vascular damage, tamponade, perforation, MI, and death. The patient's history has been reviewed, patient examined, no change in status, stable for surgery.  I have reviewed the patient's chart and labs.  Questions were answered to the patient's satisfaction.    Sherene Plancarte Lawana Pray, MD 04/27/2024 8:49 AM

## 2024-04-27 NOTE — Anesthesia Procedure Notes (Signed)
 Procedure Name: Intubation Date/Time: 04/27/2024 9:44 AM  Performed by: Raymund Calix, CRNAPre-anesthesia Checklist: Patient identified, Emergency Drugs available, Suction available and Patient being monitored Patient Re-evaluated:Patient Re-evaluated prior to induction Oxygen Delivery Method: Circle system utilized Preoxygenation: Pre-oxygenation with 100% oxygen Induction Type: IV induction Ventilation: Mask ventilation without difficulty Laryngoscope Size: Glidescope and 4 Grade View: Grade I Tube type: Oral Tube size: 7.5 mm Number of attempts: 1 Airway Equipment and Method: Stylet, Oral airway and Video-laryngoscopy Placement Confirmation: ETT inserted through vocal cords under direct vision, positive ETCO2 and breath sounds checked- equal and bilateral Secured at: 22 cm Tube secured with: Tape Dental Injury: Teeth and Oropharynx as per pre-operative assessment  Comments: Elective glidescope 2/2 large tongue and limited neck mobility. Easy glidescope intubation, grade 1 view, ETT passed gently through VC. =BBS, +EtCO2

## 2024-04-27 NOTE — Anesthesia Postprocedure Evaluation (Signed)
 Anesthesia Post Note  Patient: Gary Grimes  Procedure(s) Performed: ATRIAL FIBRILLATION ABLATION     Patient location during evaluation: PACU Anesthesia Type: General Level of consciousness: awake and alert Pain management: pain level controlled Vital Signs Assessment: post-procedure vital signs reviewed and stable Respiratory status: spontaneous breathing, nonlabored ventilation, respiratory function stable and patient connected to nasal cannula oxygen Cardiovascular status: blood pressure returned to baseline and stable Postop Assessment: no apparent nausea or vomiting Anesthetic complications: no   There were no known notable events for this encounter.  Last Vitals:  Vitals:   04/27/24 1215 04/27/24 1230  BP: (!) 118/49 (!) 107/51  Pulse: 76 72  Resp: 14 15  Temp:    SpO2: 95% 96%    Last Pain:  Vitals:   04/27/24 1149  TempSrc:   PainSc: 0-No pain                 [name]

## 2024-04-27 NOTE — Progress Notes (Signed)
 Patient walked to the bathroom without difficulties. Bilateral groins level 0, clean, dry, and intact.

## 2024-04-28 ENCOUNTER — Encounter (HOSPITAL_COMMUNITY): Payer: Self-pay | Admitting: Cardiology

## 2024-04-28 ENCOUNTER — Other Ambulatory Visit: Payer: Self-pay | Admitting: Pulmonary Disease

## 2024-04-28 ENCOUNTER — Telehealth (HOSPITAL_COMMUNITY): Payer: Self-pay

## 2024-04-28 MED FILL — Atropine Sulfate Soln Prefill Syr 1 MG/10ML (0.1 MG/ML): INTRAMUSCULAR | Qty: 1 | Status: AC

## 2024-04-28 NOTE — Telephone Encounter (Signed)
**Note De-identified  [place] Obfuscation** Please advise 

## 2024-04-28 NOTE — Telephone Encounter (Signed)
Error. Duplicate encounter created.

## 2024-04-28 NOTE — Telephone Encounter (Signed)
 Spoke with patient to complete post procedure follow up call.  Patient reports no complications with groin sites.   Instructions reviewed with patient:  Remove large bandage at puncture site after 24 hours. It is normal to have bruising, tenderness and a pea or marble sized lump/knot at the groin site which can take up to three months to resolve.  Get help right away if you notice sudden swelling at the puncture site.  Check your puncture site every day for signs of infection: fever, redness, swelling, pus drainage, warmth, foul odor or excessive pain. If this occurs, please call the office at [phone], to speak with the nurse. Get help right away if your puncture site is bleeding and the bleeding does not stop after applying firm pressure to the area.  You may continue to have skipped beats/ atrial fibrillation during the first several months after your procedure.  It is very important not to miss any doses of your blood thinner Eliquis . Patient restarted taking this medication on yesterday, 04/27/24.   You will follow up with the Afib clinic on 05/25/24 and follow up with the APP on 07/28/24.   Patient verbalized understanding to all instructions provided.

## 2024-04-28 NOTE — Telephone Encounter (Unsigned)
 Copied from CRM [id number]. Topic: Clinical - Medication Refill >> Apr 28, 2024  2:43 PM Juliaette Ober wrote: Most Recent Primary Care Visit:  Provider: Graig Lawyer  Department: [facility]  Visit Type: WELCOME TO MEDICARE  Date: 04/20/2024  Medication: triazolam  (HALCION )   Has the patient contacted their pharmacy? Yes (Agent: If no, request that the patient contact the pharmacy for the refill. If patient does not wish to contact the pharmacy document the reason why and proceed with request.) (Agent: If yes, when and what did the pharmacy advise?)  Is this the correct pharmacy for this prescription? Yes If no, delete pharmacy and type the correct one.  This is the patient's preferred pharmacy:  [facility] [place], [place] - [address] [place] [address] Phone: [phone] Fax: [phone]   Has the prescription been filled recently? Yes  Is the patient out of the medication? No  Has the patient been seen for an appointment in the last year OR does the patient have an upcoming appointment? Yes  Can we respond through MyChart? Yes  Agent: Please be advised that Rx refills may take up to 3 business days. We ask that you follow-up with your pharmacy.

## 2024-05-02 DIAGNOSIS — G4733 Obstructive sleep apnea (adult) (pediatric): Secondary | ICD-10-CM | POA: Diagnosis not present

## 2024-05-02 DIAGNOSIS — G4700 Insomnia, unspecified: Secondary | ICD-10-CM | POA: Diagnosis not present

## 2024-05-02 DIAGNOSIS — Z1330 Encounter for screening examination for mental health and behavioral disorders, unspecified: Secondary | ICD-10-CM | POA: Diagnosis not present

## 2024-05-05 ENCOUNTER — Encounter (HOSPITAL_COMMUNITY): Payer: Self-pay

## 2024-05-08 ENCOUNTER — Telehealth: Payer: Self-pay | Admitting: Orthopedic Surgery

## 2024-05-08 DIAGNOSIS — M1712 Unilateral primary osteoarthritis, left knee: Secondary | ICD-10-CM

## 2024-05-08 NOTE — Telephone Encounter (Signed)
 Dr. Delfino Fellers pt - pt lvm that he wants a call back to schedule gel injections again in the very near future.  I'm assuming an order needs to be placed.  Once April gets approval I can schedule.  [phone]

## 2024-05-09 DIAGNOSIS — G4733 Obstructive sleep apnea (adult) (pediatric): Secondary | ICD-10-CM | POA: Diagnosis not present

## 2024-05-10 NOTE — Telephone Encounter (Addendum)
 They are due in August I put in order told April need approval for August Since its in August we should have approval by then so its probably ok to schedule, has to be six months from date of last injection

## 2024-05-10 NOTE — Addendum Note (Signed)
 Addended byArla Lab on: 05/10/2024 08:49 AM   Modules accepted: Orders

## 2024-05-12 ENCOUNTER — Ambulatory Visit (INDEPENDENT_AMBULATORY_CARE_PROVIDER_SITE_OTHER): Admitting: Family Medicine

## 2024-05-12 ENCOUNTER — Encounter: Payer: Self-pay | Admitting: Family Medicine

## 2024-05-12 VITALS — BP 124/70 | HR 75 | Temp 98.0°F | Ht 71.0 in | Wt 287.0 lb

## 2024-05-12 DIAGNOSIS — E782 Mixed hyperlipidemia: Secondary | ICD-10-CM

## 2024-05-12 DIAGNOSIS — Z7985 Long-term (current) use of injectable non-insulin antidiabetic drugs: Secondary | ICD-10-CM

## 2024-05-12 DIAGNOSIS — E1140 Type 2 diabetes mellitus with diabetic neuropathy, unspecified: Secondary | ICD-10-CM

## 2024-05-12 DIAGNOSIS — I480 Paroxysmal atrial fibrillation: Secondary | ICD-10-CM

## 2024-05-12 DIAGNOSIS — I10 Essential (primary) hypertension: Secondary | ICD-10-CM | POA: Diagnosis not present

## 2024-05-12 DIAGNOSIS — Z6841 Body Mass Index (BMI) 40.0 and over, adult: Secondary | ICD-10-CM | POA: Diagnosis not present

## 2024-05-12 DIAGNOSIS — Z794 Long term (current) use of insulin: Secondary | ICD-10-CM | POA: Diagnosis not present

## 2024-05-12 MED ORDER — TIRZEPATIDE 12.5 MG/0.5ML ~~LOC~~ SOAJ: 12.5000 mg | SUBCUTANEOUS | 3 refills | Status: DC

## 2024-05-12 MED ORDER — DILTIAZEM HCL ER COATED BEADS 120 MG PO CP24: 120.0000 mg | ORAL_CAPSULE | Freq: Every day | ORAL | 0 refills | Status: DC

## 2024-05-12 NOTE — Assessment & Plan Note (Signed)
 S/p cardiac ablation. Appears to be maintaining normal rhythm. I will reduce his diltiazem  CD to 120 mg daily. If his BP control maintains over the next month, we wills top the diltiazem . He will follow with cardiology concerning whether he can stop amiodarone  200 mg daily for rhythm control and apixaban  5 mg bid for anticoagulation.

## 2024-05-12 NOTE — Assessment & Plan Note (Signed)
 Blood pressure is at goal. We will attempt to taper and stop his diltiazem  CD. Continue olmesartan  40 mg daily.
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
## 2024-05-12 NOTE — Assessment & Plan Note (Signed)
 Maximum weight: 325 lbs (04/2023) Current weight: 287 lbs Weight change since last visit: - 5 lbs Total weight loss: - 38 lbs (11.7%)  Gary Grimes has obesity with multiple serious co-morbidities. He did not tolerate phentermine  due to increased blood pressure. I will increase his tirzepatide (Mounjaro) to 12.5 mg weekly for diabetes management and weight loss.

## 2024-05-12 NOTE — Assessment & Plan Note (Signed)
 Diabetes is improving. Continue  U-500 and Tresiba  as managed by Dr. Aldona Amel. I will increase his tirzepatide (Mounjaro) to 12.5 mg weekly.

## 2024-05-12 NOTE — Assessment & Plan Note (Signed)
Lipids are at goal. Continue atorvastatin 10 mg daily.

## 2024-05-13 ENCOUNTER — Other Ambulatory Visit: Payer: Self-pay | Admitting: Cardiology

## 2024-05-13 LAB — GLUCOSE, RANDOM: Glucose, Plasma: 213 mg/dL — ABNORMAL HIGH (ref 65–139)

## 2024-05-13 LAB — HEMOGLOBIN A1C
Hgb A1c MFr Bld: 7.1 % — ABNORMAL HIGH (ref <5.7)
Mean Plasma Glucose: 157 mg/dL
eAG (mmol/L): 8.7 mmol/L

## 2024-05-15 ENCOUNTER — Ambulatory Visit: Payer: Self-pay | Admitting: Family Medicine

## 2024-05-16 NOTE — Telephone Encounter (Signed)
 Patient called back in regarding his ac1 levels he wants to know the number  of his ac1 level

## 2024-05-17 ENCOUNTER — Telehealth: Payer: Self-pay

## 2024-05-17 ENCOUNTER — Telehealth: Payer: Self-pay | Admitting: Radiology

## 2024-05-17 NOTE — Telephone Encounter (Signed)
 Will submit for gel injection in July, 2025 Next available gel injection will need to be after 08/24/2024.

## 2024-05-17 NOTE — Telephone Encounter (Signed)
-----   Message from [facility] S sent at 05/17/2024  2:22 PM EDT ----- Regarding: Gel injections This patient has lvm today wanting to check the status of the gel injections.  Please advise.  Thanks,  [facility]

## 2024-05-17 NOTE — Telephone Encounter (Addendum)
 I called again/ previously left message Told him August He voiced understanding

## 2024-05-17 NOTE — Telephone Encounter (Signed)
 They are due in August We will get approval  We left message for him previously, to advise

## 2024-05-18 ENCOUNTER — Telehealth: Payer: Self-pay

## 2024-05-18 NOTE — Telephone Encounter (Signed)
 Copied from CRM [id number]. Topic: Clinical - Request for Lab/Test Order >> May 17, 2024  4:34 PM [place] J wrote: Reason for CRM: Patient was wondering if he could have a PSA lab sent in so he can ensure everything is going okay with his prostate.

## 2024-05-18 NOTE — Telephone Encounter (Signed)
 Spoken to patient, advised that he had a PSA last year that was normal.  He is okay with waiting till his next office visit to discuss with provider. Dm/cma

## 2024-05-19 ENCOUNTER — Telehealth: Payer: Self-pay | Admitting: Physical Medicine and Rehabilitation

## 2024-05-19 NOTE — Telephone Encounter (Signed)
 Pt requesting an appt for back pain

## 2024-05-23 ENCOUNTER — Other Ambulatory Visit: Payer: Self-pay | Admitting: Physical Medicine and Rehabilitation

## 2024-05-23 DIAGNOSIS — M5416 Radiculopathy, lumbar region: Secondary | ICD-10-CM

## 2024-05-25 ENCOUNTER — Ambulatory Visit (HOSPITAL_COMMUNITY)
Admit: 2024-05-25 | Discharge: 2024-05-25 | Disposition: A | Discharge status: Home / Self Care | Attending: Internal Medicine | Admitting: Internal Medicine

## 2024-05-25 ENCOUNTER — Encounter (HOSPITAL_COMMUNITY): Payer: Self-pay | Admitting: Internal Medicine

## 2024-05-25 VITALS — BP 140/88 | HR 73 | Ht 71.0 in | Wt 287.8 lb

## 2024-05-25 DIAGNOSIS — Z79899 Other long term (current) drug therapy: Secondary | ICD-10-CM

## 2024-05-25 DIAGNOSIS — D6869 Other thrombophilia: Secondary | ICD-10-CM | POA: Diagnosis not present

## 2024-05-25 DIAGNOSIS — I480 Paroxysmal atrial fibrillation: Secondary | ICD-10-CM | POA: Diagnosis not present

## 2024-05-25 DIAGNOSIS — Z5181 Encounter for therapeutic drug level monitoring: Secondary | ICD-10-CM | POA: Diagnosis not present

## 2024-05-25 NOTE — Progress Notes (Addendum)
 Primary Care Physician: Graig Lawyer, MD Primary Cardiologist: None Electrophysiologist: Dr. Lawana Pray    Referring Physician: Dr. Kyung Pi Gary Grimes is a 63 y.o. male with a history of HTN, T2DM, CAC score of 1122, HLD, sleep apnea, and atrial fibrillation who presents for consultation in the [facility]. Patient is on Eliquis  5 mg BID for a CHADS2VASC score of 4.  On evaluation today, patient is currently in NSR. S/p Afib ablation on 04/27/24 by Dr. Lawana Pray. No episodes of Afib since ablation. He is currently taking amiodarone  200 mg daily. No chest pain or SOB. Leg sites healed without issue. No missed doses of Eliquis  5 mg BID.   Today, he denies symptoms of orthopnea, PND, lower extremity edema, dizziness, presyncope, syncope, snoring, daytime somnolence, bleeding, or neurologic sequela. The patient is tolerating medications without difficulties and is otherwise without complaint today.    he has a BMI of Body mass index is 40.14 kg/m.Gary Grimes Filed Weights   05/25/24 1019  Weight: 130.5 kg    Current Outpatient Medications  Medication Sig Dispense Refill   albuterol  (PROVENTIL ) (2.5 MG/3ML) 0.083% nebulizer solution Take 3 mLs (2.5 mg total) by nebulization every 6 (six) hours as needed for wheezing or shortness of breath. 150 mL 1   amiodarone  (PACERONE ) 200 MG tablet Take 2 tablets (400 mg total) TWICE a day for 2 weeks, then take 1 tablet (200 mg total) TWICE a day for 2 weeks, then take 1 tablet (200 mg total) ONCE a day thereafter (Patient taking differently: Take 200 mg by mouth daily.) 90 tablet 3   apixaban  (ELIQUIS ) 5 MG TABS tablet Take 1 tablet (5 mg total) by mouth 2 (two) times daily. 180 tablet 3   Ascorbic Acid  (VITAMIN C ) 1000 MG tablet Take 1,000 mg by mouth daily.     aspirin  EC 81 MG tablet Take 1 tablet (81 mg total) by mouth daily. Swallow whole. 90 tablet 3   atomoxetine  (STRATTERA ) 40 MG capsule Take 1 capsule (40 mg total)  by mouth 2 (two) times daily with a meal. 180 capsule 3   atorvastatin  (LIPITOR) 10 MG tablet TAKE ONE (1) TABLET BY MOUTH EACH DAY 90 tablet 0   b complex vitamins capsule Take 2 capsules by mouth daily.     cariprazine (VRAYLAR) 3 MG capsule Take 3 mg by mouth daily.     clonazePAM  (KLONOPIN ) 2 MG tablet Take 1 tablet (2 mg total) by mouth daily. (Patient taking differently: Take 2 mg by mouth 2 (two) times daily as needed for anxiety.) 30 tablet 1   Continuous Glucose Receiver (FREESTYLE LIBRE 2 READER) DEVI Use as advised 1 each 0   Continuous Glucose Sensor (FREESTYLE LIBRE 2 SENSOR) MISC Use 1 sensor every 2 weeks 6 each 3   diltiazem  (CARDIZEM  CD) 120 MG 24 hr capsule Take 1 capsule (120 mg total) by mouth daily. 30 capsule 0   hydrOXYzine  (ATARAX ) 25 MG tablet Take 25 mg by mouth at bedtime.     ibuprofen  (ADVIL ) 800 MG tablet Take 1 tablet (800 mg total) by mouth every 8 (eight) hours as needed. 90 tablet 1   insulin  degludec (TRESIBA  FLEXTOUCH) 200 UNIT/ML FlexTouch Pen Inject 30 Units into the skin daily. 20 up to 30 units daily 9 mL 3   Insulin  Pen Needle (SURE COMFORT PEN NEEDLES) 32G X 4 MM MISC Use daily to inject insulin  100 each 5   insulin  regular human CONCENTRATED (  HUMULIN  R U-500 KWIKPEN) 500 UNIT/ML KwikPen INJECT UP TO 180 units a day under skin as advised 8 mL 3   lidocaine  (LIDODERM ) 5 % Place 1 patch onto the skin daily. Remove & Discard patch within 12 hours or as directed by MD 14 patch 0   loperamide  (IMODIUM  A-D) 2 MG tablet Take 1 tablet (2 mg total) by mouth 4 (four) times daily as needed for diarrhea or loose stools. 30 tablet 0   methocarbamol  (ROBAXIN ) 500 MG tablet Take 2 tablets (1,000 mg total) by mouth 4 (four) times daily. (Patient taking differently: Take 1,000 mg by mouth every 6 (six) hours as needed for muscle spasms.) 30 tablet 0   olmesartan  (BENICAR ) 40 MG tablet Take 1 tablet (40 mg total) by mouth daily. 90 tablet 3   ondansetron  (ZOFRAN -ODT) 4 MG  disintegrating tablet Take 1 tablet (4 mg total) by mouth every 8 (eight) hours as needed. 20 tablet 2   sildenafil  (VIAGRA ) 100 MG tablet Take 0.5-1 tablets (50-100 mg total) by mouth daily as needed for erectile dysfunction. 5 tablet 11   Suvorexant  (BELSOMRA ) 20 MG TABS Take 1 tablet (20 mg total) by mouth at bedtime. 30 tablet 1   tirzepatide (MOUNJARO) 12.5 MG/0.5ML Pen Inject 12.5 mg into the skin once a week. 6 mL 3   traMADol  (ULTRAM ) 50 MG tablet Take 1 tablet (50 mg total) by mouth every 6 (six) hours as needed. 10 tablet 0   traZODone  (DESYREL ) 100 MG tablet Take 2 tablets (200 mg total) by mouth at bedtime. 180 tablet 3   triazolam  (HALCION ) 0.25 MG tablet TAKE 1 TABLET (0.25 MG TOTAL) BY MOUTH AT BEDTIME AS NEEDED FOR SLEEP. 30 tablet 1   No current facility-administered medications for this encounter.    Atrial Fibrillation Management history:  Previous antiarrhythmic drugs: amiodarone  Previous cardioversions: none Previous ablations: 10/28/18, 04/27/24 Anticoagulation history: Eliquis    ROS- All systems are reviewed and negative except as per the HPI above.  Physical Exam: BP (!) 140/88   Pulse 73   Ht 5\' 11"  (1.803 m)   Wt 130.5 kg   BMI 40.14 kg/m   GEN: Well nourished, well developed in no acute distress NECK: No JVD; No carotid bruits CARDIAC: Regular rate and rhythm, no murmurs, rubs, gallops RESPIRATORY:  Clear to auscultation without rales, wheezing or rhonchi  ABDOMEN: Soft, non-tender, non-distended EXTREMITIES:  No edema; No deformity   EKG today demonstrates  Vent. rate 73 BPM PR interval 182 ms QRS duration 90 ms QT/QTcB 422/464 ms P-R-T axes 61 80 56 Normal sinus rhythm Normal ECG When compared with ECG of 27-Apr-2024 11:18, QT interval has decreased  Echo 02/20/22 demonstrated   1. Left ventricular ejection fraction, by estimation, is 60 to 65%. The  left ventricle has normal function. The left ventricle has no regional  wall motion  abnormalities. Left ventricular diastolic parameters were  normal.   2. Right ventricular systolic function is normal. The right ventricular  size is normal.   3. Left atrial size was mild to moderately dilated.   4. The mitral valve is normal in structure. No evidence of mitral valve  regurgitation. No evidence of mitral stenosis.   5. The aortic valve is normal in structure. Aortic valve regurgitation is  not visualized. No aortic stenosis is present.   6. The inferior vena cava is normal in size with greater than 50%  respiratory variability, suggesting right atrial pressure of 3 mmHg.    ASSESSMENT & PLAN  CHA2DS2-VASc Score = 2  The patient's score is based upon: CHF History: 0 HTN History: 1 Diabetes History: 0 Stroke History: 0 Vascular Disease History: 1 Age Score: 0 Gender Score: 0       ASSESSMENT AND PLAN: Persistent Atrial Fibrillation (ICD10:  I48.19) The patient's CHA2DS2-VASc score is 2, indicating a 2.2% annual risk of stroke.   S/p Afib/flutter ablation on 04/27/24 by Dr. Lawana Pray.  He is currently in NSR.    Secondary Hypercoagulable State (ICD10:  D68.69) The patient is at significant risk for stroke/thromboembolism based upon his CHA2DS2-VASc Score of 2.  Continue Apixaban  (Eliquis ).  No missed doses.   Discussion on patient stopping OAC at 3 month follow up - I discussed with patient his risk score for stroke and advised it would be recommended to remain on Eliquis .   High risk medication monitoring (ICD10: J342684) Patient requires ongoing monitoring for anti-arrhythmic medication which has the potential to cause life threatening arrhythmias or AV block. Qtc stable. Continue amiodarone  200 mg daily.    Follow up with EP as scheduled.   Minnie Amber, PA-C  [facility] [address] [place], [place] [address] [phone]

## 2024-06-05 ENCOUNTER — Ambulatory Visit (INDEPENDENT_AMBULATORY_CARE_PROVIDER_SITE_OTHER): Payer: Self-pay

## 2024-06-05 ENCOUNTER — Ambulatory Visit (INDEPENDENT_AMBULATORY_CARE_PROVIDER_SITE_OTHER): Payer: Self-pay | Admitting: Podiatry

## 2024-06-05 DIAGNOSIS — M2032 Hallux varus (acquired), left foot: Secondary | ICD-10-CM

## 2024-06-05 DIAGNOSIS — M2031 Hallux varus (acquired), right foot: Secondary | ICD-10-CM
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
## 2024-06-07 ENCOUNTER — Ambulatory Visit: Attending: Cardiology | Admitting: Cardiology

## 2024-06-07 ENCOUNTER — Encounter: Payer: Self-pay | Admitting: Cardiology

## 2024-06-07 VITALS — BP 132/68 | HR 78 | Ht 71.0 in | Wt 288.0 lb

## 2024-06-07 DIAGNOSIS — E782 Mixed hyperlipidemia: Secondary | ICD-10-CM

## 2024-06-07 DIAGNOSIS — I10 Essential (primary) hypertension: Secondary | ICD-10-CM

## 2024-06-07 DIAGNOSIS — Z8679 Personal history of other diseases of the circulatory system: Secondary | ICD-10-CM | POA: Diagnosis not present

## 2024-06-07 DIAGNOSIS — I480 Paroxysmal atrial fibrillation: Secondary | ICD-10-CM

## 2024-06-07 DIAGNOSIS — E1140 Type 2 diabetes mellitus with diabetic neuropathy, unspecified: Secondary | ICD-10-CM

## 2024-06-07 DIAGNOSIS — Z6841 Body Mass Index (BMI) 40.0 and over, adult: Secondary | ICD-10-CM

## 2024-06-07 DIAGNOSIS — Z9889 Other specified postprocedural states: Secondary | ICD-10-CM

## 2024-06-07 DIAGNOSIS — I2510 Atherosclerotic heart disease of native coronary artery without angina pectoris: Secondary | ICD-10-CM | POA: Diagnosis not present

## 2024-06-07 DIAGNOSIS — G4733 Obstructive sleep apnea (adult) (pediatric): Secondary | ICD-10-CM | POA: Diagnosis not present

## 2024-06-07 NOTE — Patient Instructions (Addendum)
 Medication Instructions:  Your physician recommends that you continue on your current medications as directed. Please refer to the Current Medication list given to you today.  *If you need a refill on your cardiac medications before your next appointment, please call your pharmacy*  Lab Work: CMET, LFTs,Lipids, CBC, TSH, Vit D, A1C--Today  You may go to any [facility] Location for your lab work:  [facility] - [address] ([facility]) - [address] [phone] [address]  [place] - [address]   [place]  - [address]   [place] - [address] - [address] [employer]  - [address] ([facility]   If you have labs (blood work) drawn today and your tests are completely normal, you will receive your results only by: [employer] (if you have MyChart)  If you have any lab test that is abnormal or we need to change your treatment, we will call you or send a MyChart message to review the results.  Testing/Procedures: None ordered.  Follow-Up: At [facility], you and your health needs are our priority.  As part of our continuing mission to provide you with exceptional heart care, we have created designated Provider Care Teams.  These Care Teams include your primary Cardiologist (physician) and Advanced Practice Providers (APPs -  Physician Assistants and Nurse Practitioners) who all work together to provide you with the care you need, when you need it.  Your next appointment:   9 months  The format for your next appointment:   In Person  Provider:   Hillis Lu, MD
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
## 2024-06-08 ENCOUNTER — Ambulatory Visit: Admitting: Pulmonary Disease

## 2024-06-08 ENCOUNTER — Telehealth: Payer: Self-pay | Admitting: Cardiology

## 2024-06-08 VITALS — BP 136/81 | HR 83 | Ht 71.0 in | Wt 288.4 lb

## 2024-06-08 DIAGNOSIS — G4733 Obstructive sleep apnea (adult) (pediatric): Secondary | ICD-10-CM | POA: Diagnosis not present

## 2024-06-08 DIAGNOSIS — F3110 Bipolar disorder, current episode manic without psychotic features, unspecified: Secondary | ICD-10-CM | POA: Diagnosis not present

## 2024-06-08 DIAGNOSIS — F5104 Psychophysiologic insomnia: Secondary | ICD-10-CM

## 2024-06-08 MED ORDER — TRIAZOLAM 0.25 MG PO TABS: 0.2500 mg | ORAL_TABLET | Freq: Every evening | ORAL | 2 refills | Status: DC | PRN

## 2024-06-08 NOTE — Patient Instructions (Addendum)
 DME referral for new CPAP  CPAP of 13 with heated humidification  Download from your machine shows that is working well  I did place an order for your Halcion  with 2 refills on it  Call us  with any significant concerns  Follow-up in 6 months

## 2024-06-08 NOTE — Telephone Encounter (Signed)
 Patient would like to have lab orders transferred to PCP, Dr. Therese Flash. Not sure whether this is necessary or if the location in the orders just needs to be changed. Patient is saying he hasn't been able to get in contact with [facility] to see how he will be billed. He says he know if he goes through PCP office he won't have to pay anything. I suggested patient check with PCP office to see if labs are drawn through [facility]. He says he plans to check in the morning and will try contacting [facility] in the morning also. For now, he would like to just have the orders changed to PCP office location if possible.
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
## 2024-06-09 ENCOUNTER — Telehealth: Payer: Self-pay

## 2024-06-09 ENCOUNTER — Encounter: Payer: Self-pay | Admitting: Internal Medicine

## 2024-06-09 ENCOUNTER — Ambulatory Visit: Payer: Medicare Other | Admitting: Internal Medicine

## 2024-06-09 VITALS — BP 124/80 | HR 78 | Ht 71.0 in | Wt 288.0 lb

## 2024-06-09 DIAGNOSIS — E782 Mixed hyperlipidemia: Secondary | ICD-10-CM | POA: Diagnosis not present

## 2024-06-09 DIAGNOSIS — E119 Type 2 diabetes mellitus without complications: Secondary | ICD-10-CM

## 2024-06-09 DIAGNOSIS — Z7985 Long-term (current) use of injectable non-insulin antidiabetic drugs: Secondary | ICD-10-CM

## 2024-06-09 DIAGNOSIS — Z794 Long term (current) use of insulin: Secondary | ICD-10-CM

## 2024-06-09 DIAGNOSIS — F3160 Bipolar disorder, current episode mixed, unspecified: Secondary | ICD-10-CM | POA: Diagnosis not present

## 2024-06-09 MED ORDER — TRESIBA FLEXTOUCH 200 UNIT/ML ~~LOC~~ SOPN
30.0000 [IU] | PEN_INJECTOR | Freq: Every day | SUBCUTANEOUS | 3 refills | Status: DC
Start: 2024-06-09 — End: 2024-09-15

## 2024-06-09 MED ORDER — NOVOLOG FLEXPEN 100 UNIT/ML ~~LOC~~ SOPN: 15.0000 [IU] | PEN_INJECTOR | Freq: Every day | SUBCUTANEOUS | 11 refills | Status: DC

## 2024-06-09 NOTE — Telephone Encounter (Signed)
 Left VM that we can only do labs that are ordered by a [facility] practice due to insurance reasons.  Advised to go ahead and get labs done at their office. Dm/cma
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
## 2024-06-09 NOTE — Patient Instructions (Addendum)
 Please stop U500 and start: - Tresiba  30 units daily - NovoLog  15-20 units before b'fast  Continue: - Mounjaro  12.5 mg weekly   Try to use the [employer].  Please return in 3-4 months.

## 2024-06-09 NOTE — Telephone Encounter (Signed)
 Left vm to return call.

## 2024-06-09 NOTE — Telephone Encounter (Signed)
 Copied from CRM [id number]. Topic: General - Other >> Jun 08, 2024  4:15 PM Trula Gable C wrote: Reason for CRM: Patient called in regarding labs , stated he has gotten some orders from his cardiologist and was wondering if he is able to do the labs at the lab at [place] would like for someone to give him a callback regarding this

## 2024-06-10 ENCOUNTER — Ambulatory Visit: Payer: Self-pay | Admitting: Internal Medicine

## 2024-06-10 LAB — MICROALBUMIN / CREATININE URINE RATIO
Creatinine, Urine: 116 mg/dL (ref 20–320)
Microalb Creat Ratio: 7 mg/g{creat} (ref <30)
Microalb, Ur: 0.8 mg/dL

## 2024-06-12 NOTE — Telephone Encounter (Signed)
 Left vm to return call.

## 2024-06-12 NOTE — Telephone Encounter (Signed)
 Pt states that he will keep his labs at [facility].

## 2024-06-13 ENCOUNTER — Telehealth: Payer: Self-pay

## 2024-06-13 DIAGNOSIS — I10 Essential (primary) hypertension: Secondary | ICD-10-CM | POA: Diagnosis not present

## 2024-06-13 DIAGNOSIS — G4733 Obstructive sleep apnea (adult) (pediatric): Secondary | ICD-10-CM | POA: Diagnosis not present

## 2024-06-13 DIAGNOSIS — I2510 Atherosclerotic heart disease of native coronary artery without angina pectoris: Secondary | ICD-10-CM | POA: Diagnosis not present

## 2024-06-13 DIAGNOSIS — Z8679 Personal history of other diseases of the circulatory system: Secondary | ICD-10-CM | POA: Diagnosis not present

## 2024-06-13 DIAGNOSIS — E782 Mixed hyperlipidemia: Secondary | ICD-10-CM | POA: Diagnosis not present

## 2024-06-13 DIAGNOSIS — Z9889 Other specified postprocedural states: Secondary | ICD-10-CM | POA: Diagnosis not present

## 2024-06-13 DIAGNOSIS — E1140 Type 2 diabetes mellitus with diabetic neuropathy, unspecified: Secondary | ICD-10-CM | POA: Diagnosis not present

## 2024-06-13 DIAGNOSIS — Z6841 Body Mass Index (BMI) 40.0 and over, adult: Secondary | ICD-10-CM | POA: Diagnosis not present

## 2024-06-13 DIAGNOSIS — I480 Paroxysmal atrial fibrillation: Secondary | ICD-10-CM | POA: Diagnosis not present

## 2024-06-13 NOTE — Telephone Encounter (Signed)
 Pt has been has been notified and voices understanding.

## 2024-06-13 NOTE — Telephone Encounter (Signed)
 J, I will need more information regarding when the sugars are elevated during the day.  If they are elevated throughout, we will need to increase the Tresiba  dose by 5 units, for example.  If the sugars are higher after the particular meal, he may need to increase the dose of NovoLog . Ty! C

## 2024-06-13 NOTE — Telephone Encounter (Signed)
 Patient called stating that he is concerned about his blood sugar. He states on his Gary Grimes it has been running in the 200s and up.   Per his last visit this is his current regimen: Patient Instructions  Please stop U500 and start: - Tresiba  30 units daily - NovoLog  15-20 units before b'fast   Continue: - Mounjaro  12.5 mg weekly

## 2024-06-14 ENCOUNTER — Ambulatory Visit: Payer: Self-pay | Admitting: Cardiology

## 2024-06-14 DIAGNOSIS — I2510 Atherosclerotic heart disease of native coronary artery without angina pectoris: Secondary | ICD-10-CM

## 2024-06-14 DIAGNOSIS — E782 Mixed hyperlipidemia: Secondary | ICD-10-CM

## 2024-06-14 LAB — COMPREHENSIVE METABOLIC PANEL WITH GFR
ALT: 31 IU/L (ref 0–44)
AST: 25 IU/L (ref 0–40)
Albumin: 4.6 g/dL (ref 3.9–4.9)
Alkaline Phosphatase: 109 IU/L (ref 44–121)
BUN/Creatinine Ratio: 12 (ref 10–24)
BUN: 15 mg/dL (ref 8–27)
Bilirubin Total: 0.6 mg/dL (ref 0.0–1.2)
CO2: 22 mmol/L (ref 20–29)
Calcium: 10 mg/dL (ref 8.6–10.2)
Chloride: 100 mmol/L (ref 96–106)
Creatinine, Ser: 1.22 mg/dL (ref 0.76–1.27)
Globulin, Total: 2.5 g/dL (ref 1.5–4.5)
Glucose: 223 mg/dL — ABNORMAL HIGH (ref 70–99)
Potassium: 4.7 mmol/L (ref 3.5–5.2)
Sodium: 138 mmol/L (ref 134–144)
Total Protein: 7.1 g/dL (ref 6.0–8.5)
eGFR: 66 mL/min/{1.73_m2} (ref >59)

## 2024-06-14 LAB — HEPATIC FUNCTION PANEL: Bilirubin, Direct: 0.21 mg/dL (ref 0.00–0.40)

## 2024-06-14 LAB — CBC
Hematocrit: 46.7 % (ref 37.5–51.0)
Hemoglobin: 15.3 g/dL (ref 13.0–17.7)
MCH: 31.5 pg (ref 26.6–33.0)
MCHC: 32.8 g/dL (ref 31.5–35.7)
MCV: 96 fL (ref 79–97)
Platelets: 328 10*3/uL (ref 150–450)
RBC: 4.86 x10E6/uL (ref 4.14–5.80)
RDW: 12.7 % (ref 11.6–15.4)
WBC: 7.7 10*3/uL (ref 3.4–10.8)

## 2024-06-14 LAB — LIPID PANEL
Chol/HDL Ratio: 3.1 ratio (ref 0.0–5.0)
Cholesterol, Total: 112 mg/dL (ref 100–199)
HDL: 36 mg/dL — ABNORMAL LOW (ref >39)
LDL Chol Calc (NIH): 34 mg/dL (ref 0–99)
Triglycerides: 279 mg/dL — ABNORMAL HIGH (ref 0–149)
VLDL Cholesterol Cal: 42 mg/dL — ABNORMAL HIGH (ref 5–40)

## 2024-06-14 LAB — TSH: TSH: 2.22 u[IU]/mL (ref 0.450–4.500)

## 2024-06-14 LAB — HEMOGLOBIN A1C
Est. average glucose Bld gHb Est-mCnc: 160 mg/dL
Hgb A1c MFr Bld: 7.2 % — ABNORMAL HIGH (ref 4.8–5.6)

## 2024-06-14 LAB — VITAMIN D 25 HYDROXY (VIT D DEFICIENCY, FRACTURES): Vit D, 25-Hydroxy: 29.7 ng/mL — ABNORMAL LOW (ref 30.0–100.0)

## 2024-06-14 MED ORDER — FISH OIL 1000 MG PO CAPS
2.0000 | ORAL_CAPSULE | Freq: Two times a day (BID) | ORAL | Status: AC
Start: 2024-06-14 — End: ?

## 2024-06-14 NOTE — Telephone Encounter (Signed)
 MyChart message

## 2024-06-14 NOTE — Telephone Encounter (Signed)
-----   Message from [place] Revankar sent at 06/14/2024  8:26 AM EDT ----- Diet low in fried fatty foods and carbohydrates.  Fish oil 2 g twice daily.  Recheck in 3 months liver lipid.  Copy primary Nelia Balzarine, MD 06/14/2024 8:26 AM  ----- Message ----- From: [facility] Lab Results In Sent: 06/14/2024   5:37 AM EDT To: Nelia Balzarine, MD

## 2024-06-15 NOTE — Telephone Encounter (Signed)
 Left a detailed vm

## 2024-06-15 NOTE — Telephone Encounter (Signed)
 Follow Up to the message below:   Patient called and left a vm stating he did change his regimen as advised earlier this week. He is still having some high blood sugars 200-300s. He wants to know what should he do.

## 2024-06-16 ENCOUNTER — Telehealth: Payer: Self-pay | Admitting: Family Medicine

## 2024-06-19 ENCOUNTER — Other Ambulatory Visit: Payer: Self-pay

## 2024-06-19 ENCOUNTER — Emergency Department (HOSPITAL_BASED_OUTPATIENT_CLINIC_OR_DEPARTMENT_OTHER)
Admission: EM | Admit: 2024-06-19 | Discharge: 2024-06-19 | Disposition: A | Discharge status: Home / Self Care | Attending: Emergency Medicine | Admitting: Emergency Medicine

## 2024-06-19 ENCOUNTER — Ambulatory Visit: Payer: Self-pay

## 2024-06-19 ENCOUNTER — Emergency Department (HOSPITAL_BASED_OUTPATIENT_CLINIC_OR_DEPARTMENT_OTHER)

## 2024-06-19 ENCOUNTER — Encounter (HOSPITAL_BASED_OUTPATIENT_CLINIC_OR_DEPARTMENT_OTHER): Payer: Self-pay

## 2024-06-19 DIAGNOSIS — Z794 Long term (current) use of insulin: Secondary | ICD-10-CM | POA: Diagnosis not present

## 2024-06-19 DIAGNOSIS — I2510 Atherosclerotic heart disease of native coronary artery without angina pectoris: Secondary | ICD-10-CM | POA: Insufficient documentation

## 2024-06-19 DIAGNOSIS — E1140 Type 2 diabetes mellitus with diabetic neuropathy, unspecified: Secondary | ICD-10-CM | POA: Insufficient documentation

## 2024-06-19 DIAGNOSIS — Z79899 Other long term (current) drug therapy: Secondary | ICD-10-CM | POA: Insufficient documentation

## 2024-06-19 DIAGNOSIS — Z7901 Long term (current) use of anticoagulants: Secondary | ICD-10-CM | POA: Diagnosis not present

## 2024-06-19 DIAGNOSIS — Z87891 Personal history of nicotine dependence: Secondary | ICD-10-CM | POA: Insufficient documentation

## 2024-06-19 DIAGNOSIS — Z8616 Personal history of COVID-19: Secondary | ICD-10-CM | POA: Diagnosis not present

## 2024-06-19 DIAGNOSIS — K5730 Diverticulosis of large intestine without perforation or abscess without bleeding: Secondary | ICD-10-CM | POA: Diagnosis not present

## 2024-06-19 DIAGNOSIS — Z7982 Long term (current) use of aspirin: Secondary | ICD-10-CM | POA: Insufficient documentation

## 2024-06-19 DIAGNOSIS — N281 Cyst of kidney, acquired: Secondary | ICD-10-CM | POA: Diagnosis not present

## 2024-06-19 DIAGNOSIS — R112 Nausea with vomiting, unspecified: Secondary | ICD-10-CM | POA: Diagnosis present

## 2024-06-19 DIAGNOSIS — K429 Umbilical hernia without obstruction or gangrene: Secondary | ICD-10-CM | POA: Diagnosis not present

## 2024-06-19 DIAGNOSIS — I10 Essential (primary) hypertension: Secondary | ICD-10-CM | POA: Insufficient documentation

## 2024-06-19 DIAGNOSIS — K529 Noninfective gastroenteritis and colitis, unspecified: Secondary | ICD-10-CM | POA: Insufficient documentation

## 2024-06-19 LAB — COMPREHENSIVE METABOLIC PANEL WITH GFR
ALT: 25 U/L (ref 0–44)
AST: 23 U/L (ref 15–41)
Albumin: 4.9 g/dL (ref 3.5–5.0)
Alkaline Phosphatase: 105 U/L (ref 38–126)
Anion gap: 13 (ref 5–15)
BUN: 18 mg/dL (ref 8–23)
CO2: 26 mmol/L (ref 22–32)
Calcium: 10.6 mg/dL — ABNORMAL HIGH (ref 8.9–10.3)
Chloride: 103 mmol/L (ref 98–111)
Creatinine, Ser: 1.36 mg/dL — ABNORMAL HIGH (ref 0.61–1.24)
GFR, Estimated: 58 mL/min — ABNORMAL LOW (ref >60)
Glucose, Bld: 175 mg/dL — ABNORMAL HIGH (ref 70–99)
Potassium: 4.2 mmol/L (ref 3.5–5.1)
Sodium: 142 mmol/L (ref 135–145)
Total Bilirubin: 0.6 mg/dL (ref 0.0–1.2)
Total Protein: 7.9 g/dL (ref 6.5–8.1)

## 2024-06-19 LAB — URINALYSIS, ROUTINE W REFLEX MICROSCOPIC
Bacteria, UA: NONE SEEN
Bilirubin Urine: NEGATIVE
Glucose, UA: NEGATIVE mg/dL
Hgb urine dipstick: NEGATIVE
Ketones, ur: NEGATIVE mg/dL
Leukocytes,Ua: NEGATIVE
Nitrite: NEGATIVE
Protein, ur: 30 mg/dL — AB
Specific Gravity, Urine: 1.041 — ABNORMAL HIGH (ref 1.005–1.030)
pH: 5.5 (ref 5.0–8.0)

## 2024-06-19 LAB — CBC
HCT: 45.8 % (ref 39.0–52.0)
Hemoglobin: 15.6 g/dL (ref 13.0–17.0)
MCH: 31.4 pg (ref 26.0–34.0)
MCHC: 34.1 g/dL (ref 30.0–36.0)
MCV: 92.2 fL (ref 80.0–100.0)
Platelets: 316 10*3/uL (ref 150–400)
RBC: 4.97 MIL/uL (ref 4.22–5.81)
RDW: 13.4 % (ref 11.5–15.5)
WBC: 7.5 10*3/uL (ref 4.0–10.5)
nRBC: 0 % (ref 0.0–0.2)

## 2024-06-19 LAB — LIPASE, BLOOD: Lipase: 42 U/L (ref 11–51)

## 2024-06-19 MED ORDER — ONDANSETRON HCL 4 MG/2ML IJ SOLN
4.0000 mg | Freq: Once | INTRAMUSCULAR | Status: AC
  Administered 2024-06-19: 4 mg via INTRAVENOUS
  Filled 2024-06-19: qty 2

## 2024-06-19 MED ORDER — SODIUM CHLORIDE 0.9 % IV BOLUS
1000.0000 mL | Freq: Once | INTRAVENOUS | Status: AC
  Administered 2024-06-19: 1000 mL via INTRAVENOUS

## 2024-06-19 MED ORDER — DICYCLOMINE HCL 20 MG PO TABS: 20.0000 mg | ORAL_TABLET | Freq: Two times a day (BID) | ORAL | 0 refills | Status: DC

## 2024-06-19 MED ORDER — IOHEXOL 300 MG/ML  SOLN
100.0000 mL | Freq: Once | INTRAMUSCULAR | Status: AC | PRN
  Administered 2024-06-19: 100 mL via INTRAVENOUS

## 2024-06-19 MED ORDER — DICYCLOMINE HCL 10 MG PO CAPS
10.0000 mg | ORAL_CAPSULE | Freq: Once | ORAL | Status: AC
  Administered 2024-06-19: 10 mg via ORAL
  Filled 2024-06-19: qty 1

## 2024-06-19 MED ORDER — ONDANSETRON HCL 4 MG PO TABS: 4.0000 mg | ORAL_TABLET | Freq: Four times a day (QID) | ORAL | 0 refills | Status: DC

## 2024-06-19 MED ORDER — ACETAMINOPHEN 500 MG PO TABS
1000.0000 mg | ORAL_TABLET | Freq: Once | ORAL | Status: AC
  Administered 2024-06-19: 1000 mg via ORAL
  Filled 2024-06-19: qty 2
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
## 2024-06-19 NOTE — ED Triage Notes (Signed)
 In for eval of severe abd pain with nausea., vomiting, and diarrhea. Knot just below umbilicus. Takes GLP-1.

## 2024-06-19 NOTE — Telephone Encounter (Signed)
 Duplicate encounter. Please see previous nurse triage encounter. Patient referred to ED for evaluation to rule out pancreatitis.

## 2024-06-19 NOTE — ED Notes (Signed)
Reviewed discharge instructions, medications, and home care with pt. Pt verbalized understanding and had no further questions. Pt exited ED without complications.

## 2024-06-19 NOTE — Telephone Encounter (Signed)
 FYI Only or Action Required?: FYI only for provider.  Patient was last seen in primary care on 05/12/2024 by Thedora Garnette HERO, MD. Called Nurse Triage reporting Abdominal Pain. Symptoms began several days ago. Interventions attempted: Nothing. Symptoms are: gradually worsening.  Triage Disposition: Go to ED Now (Notify PCP)  Patient/caregiver understands and will follow disposition?: Yes      Copied from CRM [id number]. Topic: Clinical - Red Word Triage >> Jun 19, 2024  8:35 AM Revonda D wrote: Red Word that prompted transfer to Nurse Triage: Stomach Pain  Pt stated that he is experiencing stomach pains and has been vomiting all today and this weekend. Pt is requesting to speak to a nurse now.     -------------------------------------------------------------------- From previous Reason for Contact - Scheduling: Patient/patient representative is calling to schedule an appointment. Refer to attachments for appointment information. Reason for Disposition  [1] SEVERE pain (e.g., excruciating) AND [2] present > 1 hour  Answer Assessment - Initial Assessment Questions 1. LOCATION: Where does it hurt?       Middle of stomach, directly behind belly button and it feels distended 2. RADIATION: Does the pain shoot anywhere else? (e.g., chest, back)     No 3. ONSET: When did the pain begin? (Minutes, hours or days ago)      Saturday 4. SUDDEN: Gradual or sudden onset?     Gradually  5. PATTERN Does the pain come and go, or is it constant?    - If it comes and goes: How long does it last? Do you have pain now?     (Note: Comes and goes means the pain is intermittent. It goes away completely between bouts.)    - If constant: Is it getting better, staying the same, or getting worse?      (Note: Constant means the pain never goes away completely; most serious pain is constant and gets worse.)      Constant 6. SEVERITY: How bad is the pain?  (e.g., Scale 1-10; mild, moderate, or  severe)    - MILD (1-3): Doesn't interfere with normal activities, abdomen soft and not tender to touch.     - MODERATE (4-7): Interferes with normal activities or awakens from sleep, abdomen tender to touch.     - SEVERE (8-10): Excruciating pain, doubled over, unable to do any normal activities.       7 7. RECURRENT SYMPTOM: Have you ever had this type of stomach pain before? If Yes, ask: When was the last time? and What happened that time?      No 8. CAUSE: What do you think is causing the stomach pain?      Took a mounjaro  injection Friday evening and then these symptoms started Saturday but he has been on it since January 9. RELIEVING/AGGRAVATING FACTORS: What makes it better or worse? (e.g., antacids, bending or twisting motion, bowel movement)     No worsened or relieving factors 10. OTHER SYMPTOMS: Do you have any other symptoms? (e.g., back pain, diarrhea, fever, urination pain, vomiting)       Vomiting (too many to count/appearance is now just bile), chills  Denies: diarrhea (states he has constipation),  Protocols used: Abdominal Pain - Male-A-AH

## 2024-06-19 NOTE — Telephone Encounter (Signed)
 Was advised to go to the UC or ER due to not appointments available here today by Triage RN. Dm/cma

## 2024-06-19 NOTE — Discharge Instructions (Addendum)
 While you were in the emergency room, you had blood work done that was normal.  You had a CT scan done of your abdomen that also was normal.  My suspicion is that you likely picked up a viral stomach bug.  Your symptoms should improve over the next 24 to 48 hours.  Have sent your prescription for Zofran .  You can take that every 6-8 hours for nausea.  You can buy over-the-counter Imodium  to take for diarrhea.  Return to the emergency room if you develop new or worsening pain in your abdomen, fever, inability to eat or drink.

## 2024-06-19 NOTE — Telephone Encounter (Signed)
 Noted. Patient referred to go to ED by triage nurse.

## 2024-06-20 ENCOUNTER — Other Ambulatory Visit: Payer: Self-pay | Admitting: Family Medicine

## 2024-06-20 DIAGNOSIS — Z794 Long term (current) use of insulin: Secondary | ICD-10-CM

## 2024-06-20 NOTE — Telephone Encounter (Unsigned)
 Copied from CRM [id number]. Topic: Clinical - Medication Refill >> Jun 20, 2024  3:37 PM Berneda FALCON wrote: Medication:  tirzepatide  (MOUNJARO ) 12.5 MG/0.5ML Pen    Has the patient contacted their pharmacy? Yes (Agent: If no, request that the patient contact the pharmacy for the refill. If patient does not wish to contact the pharmacy document the reason why and proceed with request.) (Agent: If yes, when and what did the pharmacy advise?)  This is the patient's preferred pharmacy:  [facility] - [place], [place] - [address] [place]  [address] Phone: [phone] Fax: [phone]  Is this the correct pharmacy for this prescription? Yes If no, delete pharmacy and type the correct one.   Has the prescription been filled recently? No  Is the patient out of the medication? Yes  Has the patient been seen for an appointment in the last year OR does the patient have an upcoming appointment? Yes  Can we respond through MyChart? Yes  Agent: Please be advised that Rx refills may take up to 3 business days. We ask that you follow-up with your pharmacy.

## 2024-06-21 ENCOUNTER — Other Ambulatory Visit: Payer: Self-pay | Admitting: Family Medicine

## 2024-06-21 ENCOUNTER — Telehealth: Payer: Self-pay

## 2024-06-21 DIAGNOSIS — F3160 Bipolar disorder, current episode mixed, unspecified: Secondary | ICD-10-CM | POA: Diagnosis not present

## 2024-06-21 DIAGNOSIS — F5104 Psychophysiologic insomnia: Secondary | ICD-10-CM

## 2024-06-21 NOTE — Telephone Encounter (Signed)
 Called [facility] and they have the refills and will get the Mounjaro  ready for patient to pick up.  Patient notified VIA phone. Dm/cma

## 2024-06-21 NOTE — Telephone Encounter (Signed)
 Copied from CRM [id number]. Topic: Clinical - Prescription Issue >> Jun 21, 2024  2:43 PM Berneda FALCON wrote: Reason for CRM: Pt calling in to check on the status of his Mounjaro  refill that was requested on 6/23. I did let him know that refills can take up to 3 business days to refill.  [facility] - [place], [place] - [address] [place] [address] Phone: [phone] Fax: [phone] Hours: Not open 24 hours  Please call patient when this is completed [phone]

## 2024-06-22 DIAGNOSIS — G4733 Obstructive sleep apnea (adult) (pediatric): Secondary | ICD-10-CM | POA: Diagnosis not present

## 2024-06-22 NOTE — Telephone Encounter (Signed)
 Refill request for  Trazadone 100 mg LR  07/18/24, #180, 3 rf LOV  05/12/24 FOV  none scheduled.  Please review and advise.  Thanks. Dm/cma

## 2024-06-23 ENCOUNTER — Other Ambulatory Visit: Payer: Self-pay | Admitting: Family Medicine

## 2024-06-23 DIAGNOSIS — I10 Essential (primary) hypertension: Secondary | ICD-10-CM

## 2024-06-23 DIAGNOSIS — R400 Somnolence: Secondary | ICD-10-CM | POA: Diagnosis not present

## 2024-06-23 DIAGNOSIS — G4733 Obstructive sleep apnea (adult) (pediatric): Secondary | ICD-10-CM | POA: Diagnosis not present

## 2024-06-23 NOTE — Telephone Encounter (Signed)
 BP have 124/80 -120/76.  Should he continue taking the Diltiazem ?  Please review and advise.  Thanks. Dm/cma

## 2024-06-23 NOTE — Telephone Encounter (Unsigned)
 Copied from CRM [id number]. Topic: Clinical - Medication Refill >> Jun 23, 2024  9:54 AM Macario HERO wrote: Medication: diltiazem  (CARDIZEM  CD) 120 MG 24 hr capsule [[id number]]  Has the patient contacted their pharmacy? Yes (Agent: If no, request that the patient contact the pharmacy for the refill. If patient does not wish to contact the pharmacy document the reason why and proceed with request.) (Agent: If yes, when and what did the pharmacy advise?) said they will fax us   This is the patient's preferred pharmacy:  [facility] [place], [place] - [address] [place] [address] Phone: [phone] Fax: [phone]  Is this the correct pharmacy for this prescription? Yes If no, delete pharmacy and type the correct one.   Has the prescription been filled recently? Yes  Is the patient out of the medication? Yes  Has the patient been seen for an appointment in the last year OR does the patient have an upcoming appointment? Yes  Can we respond through MyChart? Yes  Agent: Please be advised that Rx refills may take up to 3 business days. We ask that you follow-up with your pharmacy.

## 2024-06-23 NOTE — Telephone Encounter (Signed)
See other message.  Dm/cma  

## 2024-06-27 ENCOUNTER — Telehealth: Payer: Self-pay

## 2024-06-27 NOTE — Telephone Encounter (Signed)
 Patients back is doing better and he would like to hold on his injection. I will close referral until needed.

## 2024-07-03 ENCOUNTER — Ambulatory Visit: Admitting: Podiatry

## 2024-07-03 ENCOUNTER — Encounter: Payer: Self-pay | Admitting: Podiatry

## 2024-07-03 ENCOUNTER — Ambulatory Visit (INDEPENDENT_AMBULATORY_CARE_PROVIDER_SITE_OTHER)

## 2024-07-03 VITALS — Ht 71.0 in | Wt 284.0 lb

## 2024-07-03 DIAGNOSIS — M7752 Other enthesopathy of left foot: Secondary | ICD-10-CM | POA: Diagnosis not present

## 2024-07-03 DIAGNOSIS — M10372 Gout due to renal impairment, left ankle and foot: Secondary | ICD-10-CM

## 2024-07-03 NOTE — Progress Notes (Signed)
  Subjective:  Patient ID: Gary Grimes, male    DOB: 01-07-1961,  MRN: [id number]  Chief Complaint  Patient presents with   Foot Pain    Rm 7 Patient is here for pain of the left hallux and index toe. Pain started on Friday(06/30/24) after extensive walking. Patient states pain is an 8 on a scale of 1-10 for both toes. Patient is not taking anything for the pain. There is some redness on the exterior side of the left hallux.    63 y.o. male presents with the above complaint. History confirmed with patient.  Does not recall injury that he remembers  Objective:  Physical Exam: warm, good capillary refill, no trophic changes or ulcerative lesions, normal DP and PT pulses, normal sensory exam, and pain and erythema and swelling along the medial hallux of the left foot extending to the MTP   Radiographs: Multiple views x-ray of the left foot: Periarticular erosions of the first medial IPJ and MPJ of the left foot Assessment:   1. 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  We reviewed his x-rays and clinical picture and discussed with him as likely is acute gouty arthropathy developing.  I recommended lab work including CBC ESR BMP and a uric acid level.  We discussed diagnosis and treatment of acute on chronic gout and some of the difficulties this can present.  If his lab works are not impressive for gout may consider treatment empirically for cellulitis which I think is less likely has no history of ulceration or skin tear.  Pending results I will let him know our next [facility] management and prescribe treatment for acute flare possibly colchicine or steroid such as prednisone  taper  No follow-ups on file.

## 2024-07-04 LAB — CBC WITH DIFFERENTIAL/PLATELET
Basophils Absolute: 0.1 x10E3/uL (ref 0.0–0.2)
Basos: 1 %
EOS (ABSOLUTE): 0.1 x10E3/uL (ref 0.0–0.4)
Eos: 1 %
Hematocrit: 44.5 % (ref 37.5–51.0)
Hemoglobin: 14.6 g/dL (ref 13.0–17.7)
Immature Grans (Abs): 0.1 x10E3/uL (ref 0.0–0.1)
Immature Granulocytes: 1 %
Lymphocytes Absolute: 2.9 x10E3/uL (ref 0.7–3.1)
Lymphs: 32 %
MCH: 31.1 pg (ref 26.6–33.0)
MCHC: 32.8 g/dL (ref 31.5–35.7)
MCV: 95 fL (ref 79–97)
Monocytes Absolute: 0.9 x10E3/uL (ref 0.1–0.9)
Monocytes: 10 %
Neutrophils Absolute: 4.8 x10E3/uL (ref 1.4–7.0)
Neutrophils: 55 %
Platelets: 325 x10E3/uL (ref 150–450)
RBC: 4.69 x10E6/uL (ref 4.14–5.80)
RDW: 12.8 % (ref 11.6–15.4)
WBC: 8.9 x10E3/uL (ref 3.4–10.8)

## 2024-07-04 LAB — BASIC METABOLIC PANEL WITH GFR
BUN/Creatinine Ratio: 11 (ref 10–24)
BUN: 15 mg/dL (ref 8–27)
CO2: 21 mmol/L (ref 20–29)
Calcium: 9.4 mg/dL (ref 8.6–10.2)
Chloride: 101 mmol/L (ref 96–106)
Creatinine, Ser: 1.31 mg/dL — ABNORMAL HIGH (ref 0.76–1.27)
Glucose: 207 mg/dL — ABNORMAL HIGH (ref 70–99)
Potassium: 4.5 mmol/L (ref 3.5–5.2)
Sodium: 138 mmol/L (ref 134–144)
eGFR: 60 mL/min/1.73 (ref >59)

## 2024-07-04 LAB — URIC ACID: Uric Acid: 4.9 mg/dL (ref 3.8–8.4)

## 2024-07-04 LAB — SEDIMENTATION RATE: Sed Rate: 5 mm/h (ref 0–30)

## 2024-07-05 ENCOUNTER — Telehealth: Payer: Self-pay | Admitting: Podiatry

## 2024-07-05 ENCOUNTER — Ambulatory Visit: Admitting: Nurse Practitioner

## 2024-07-05 ENCOUNTER — Encounter: Payer: Self-pay | Admitting: Nurse Practitioner

## 2024-07-05 ENCOUNTER — Ambulatory Visit: Payer: Self-pay | Admitting: Podiatry

## 2024-07-05 ENCOUNTER — Ambulatory Visit: Payer: Self-pay

## 2024-07-05 VITALS — BP 124/74 | HR 80 | Temp 98.9°F | Ht 71.0 in | Wt 274.8 lb

## 2024-07-05 DIAGNOSIS — E860 Dehydration: Secondary | ICD-10-CM

## 2024-07-05 DIAGNOSIS — E119 Type 2 diabetes mellitus without complications: Secondary | ICD-10-CM

## 2024-07-05 DIAGNOSIS — I951 Orthostatic hypotension: Secondary | ICD-10-CM | POA: Diagnosis not present

## 2024-07-05 DIAGNOSIS — R42 Dizziness and giddiness: Secondary | ICD-10-CM

## 2024-07-05 DIAGNOSIS — F3160 Bipolar disorder, current episode mixed, unspecified: Secondary | ICD-10-CM | POA: Diagnosis not present

## 2024-07-05 LAB — GLUCOSE, POCT (MANUAL RESULT ENTRY): POC Glucose: 173 mg/dL — AB (ref 70–99)

## 2024-07-05 MED ORDER — CEPHALEXIN 500 MG PO CAPS: 500.0000 mg | ORAL_CAPSULE | Freq: Three times a day (TID) | ORAL | 0 refills | Status: DC

## 2024-07-05 NOTE — Progress Notes (Signed)
 Established Patient Visit  Patient: Gary Grimes   DOB: 09-03-1961   63 y.o. Male  MRN: [id number] Visit Date: 07/05/2024  Subjective:    Chief Complaint  Patient presents with   Dizziness    4-5 days light headedness/dizziness sometimes feels like he is going to pass out    Postural dizziness Onset 1week ago, constant dizzness but worse to rapid change in postiion (sitting to standing) or head movement. No syncope, no palpitation, no tinnitus, no nausea. He had similar symptoms in 2021, resolved with adequate oral hydration in the past. 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 Labs completed by podiatry 07/03/24: stable CBC, and BMP Urinalysis indicates specific gravity at 1.041 with positive protein  Suspect symptoms are due to dehydration Decrease olmesartan  dose to 20mg  daily Advised to Maintain at least 64-70oz of water daily Decrease olmesartan  dose to 20mg  daily Change position slowly. Provided work note. F/up in 2weeks with Dr. 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 Hx of vertigo  Reviewed medical, surgical, and social history today  Medications: Outpatient Medications Prior to Visit  Medication Sig   albuterol  (PROVENTIL ) (2.5 MG/3ML) 0.083% nebulizer solution Take 3 mLs (2.5 mg total) by nebulization every 6 (six) hours as needed for wheezing or shortness of breath.   apixaban  (ELIQUIS ) 5 MG TABS tablet Take 1 tablet (5 mg total) by mouth 2 (two) times daily.   Ascorbic Acid  (VITAMIN C ) 1000 MG tablet Take 1,000 mg by mouth daily.   aspirin  EC 81 MG tablet Take 1 tablet (81 mg total) by mouth daily. Swallow whole.   atomoxetine  (STRATTERA ) 40 MG capsule  Take 1 capsule (40 mg total) by mouth 2 (two) times daily with a meal.   atorvastatin  (LIPITOR) 10 MG tablet TAKE ONE (1) TABLET BY MOUTH EACH DAY   cariprazine (VRAYLAR) 3 MG capsule Take 3 mg by mouth daily.   Continuous Glucose Receiver (FREESTYLE LIBRE 2 READER) DEVI Use as advised   Continuous Glucose Sensor (FREESTYLE LIBRE 2 SENSOR) MISC Use 1 sensor every 2 weeks   dicyclomine  (BENTYL ) 20 MG tablet Take 1 tablet (20 mg total) by mouth 2 (two) times daily. (Patient taking differently: Take 20 mg by mouth as needed for spasms.)   hydrOXYzine  (ATARAX ) 25 MG tablet Take 25 mg by mouth at bedtime.   ibuprofen  (ADVIL ) 800 MG tablet Take 1 tablet (800 mg total) by mouth every 8 (eight) hours as needed.   insulin  aspart (NOVOLOG  FLEXPEN) 100 UNIT/ML FlexPen Inject 15-20 Units into the skin daily before breakfast.   insulin  degludec (TRESIBA  FLEXTOUCH) 200 UNIT/ML FlexTouch Pen Inject 30 Units into the skin daily. 20 up to 30 units daily (Patient taking differently: Inject 40 Units into the skin daily. 20 up to 40 units daily)   olmesartan  (BENICAR ) 40 MG tablet Take 1 tablet (40 mg total) by mouth daily.   Omega-3 Fatty Acids (FISH OIL ) 1000 MG CAPS Take 2 capsules (2,000 mg total) by mouth 2 (two) times daily.   ondansetron  (ZOFRAN ) 4 MG tablet Take 1 tablet (4 mg total) by mouth every 6 (six) hours.   tirzepatide  (MOUNJARO ) 12.5 MG/0.5ML Pen Inject 12.5  mg into the skin once a week.   traMADol  (ULTRAM ) 50 MG tablet Take 1 tablet (50 mg total) by mouth every 6 (six) hours as needed.   traZODone  (DESYREL ) 100 MG tablet TAKE TWO TABLETS BY MOUTH EVERY NIGHT AT BEDTIME   triazolam  (HALCION ) 0.25 MG tablet Take 1 tablet (0.25 mg total) by mouth at bedtime as needed for sleep.   amiodarone  (PACERONE ) 200 MG tablet Take 2 tablets (400 mg total) TWICE a day for 2 weeks, then take 1 tablet (200 mg total) TWICE a day for 2 weeks, then take 1 tablet (200 mg total) ONCE a day thereafter (Patient taking  differently: Take 200 mg by mouth daily.)   b complex vitamins capsule Take 2 capsules by mouth daily. (Patient not taking: Reported on 07/05/2024)   cephALEXin  (KEFLEX ) 500 MG capsule Take 1 capsule (500 mg total) by mouth 3 (three) times daily.   clonazePAM  (KLONOPIN ) 2 MG tablet Take 1 tablet (2 mg total) by mouth daily. (Patient not taking: Reported on 07/05/2024)   diltiazem  (CARDIZEM  CD) 120 MG 24 hr capsule Take 1 capsule (120 mg total) by mouth daily. (Patient not taking: Reported on 07/05/2024)   Insulin  Pen Needle (SURE COMFORT PEN NEEDLES) 32G X 4 MM MISC Use daily to inject insulin    lidocaine  (LIDODERM ) 5 % Place 1 patch onto the skin daily. Remove & Discard patch within 12 hours or as directed by MD   loperamide  (IMODIUM  A-D) 2 MG tablet Take 1 tablet (2 mg total) by mouth 4 (four) times daily as needed for diarrhea or loose stools.   methocarbamol  (ROBAXIN ) 500 MG tablet Take 2 tablets (1,000 mg total) by mouth 4 (four) times daily. (Patient taking differently: Take 1,000 mg by mouth every 6 (six) hours as needed for muscle spasms.)   ondansetron  (ZOFRAN -ODT) 4 MG disintegrating tablet Take 1 tablet (4 mg total) by mouth every 8 (eight) hours as needed.   sildenafil  (VIAGRA ) 100 MG tablet Take 0.5-1 tablets (50-100 mg total) by mouth daily as needed for erectile dysfunction. (Patient not taking: Reported on 07/05/2024)   Suvorexant  (BELSOMRA ) 20 MG TABS Take 1 tablet (20 mg total) by mouth at bedtime. (Patient not taking: Reported on 07/05/2024)   No facility-administered medications prior to visit.   Reviewed past medical and social history.   ROS per HPI above  Last CBC Lab Results  Component Value Date   WBC 8.9 07/03/2024   HGB 14.6 07/03/2024   HCT 44.5 07/03/2024   MCV 95 07/03/2024   MCH 31.1 07/03/2024   RDW 12.8 07/03/2024   PLT 325 07/03/2024   Last metabolic panel Lab Results  Component Value Date   GLUCOSE 207 (H) 07/03/2024   NA 138 07/03/2024   K 4.5 07/03/2024    CL 101 07/03/2024   CO2 21 07/03/2024   BUN 15 07/03/2024   CREATININE 1.31 (H) 07/03/2024   EGFR 60 07/03/2024   CALCIUM  9.4 07/03/2024   PROT 7.9 06/19/2024   ALBUMIN  4.9 06/19/2024   LABGLOB 2.5 06/13/2024   AGRATIO 1.6 08/06/2020   BILITOT 0.6 06/19/2024   ALKPHOS 105 06/19/2024   AST 23 06/19/2024   ALT 25 06/19/2024   ANIONGAP 13 06/19/2024   Last hemoglobin A1c Lab Results  Component Value Date   HGBA1C 7.2 (H) 06/13/2024        Objective:  BP 124/74 (BP Location: Right Arm, Patient Position: Sitting)   Pulse 80   Temp 98.9 F (37.2 C) (Oral)  Ht 5' 11 (1.803 m)   Wt 274 lb 12.8 oz (124.6 kg)   SpO2 98%   BMI 38.33 kg/m      Physical Exam Vitals and nursing note reviewed.  HENT:     Right Ear: Ear canal and external ear normal.     Left Ear: Tympanic membrane, ear canal and external ear normal.  Cardiovascular:     Rate and Rhythm: Normal rate and regular rhythm.     Pulses: Normal pulses.     Heart sounds: Normal heart sounds.  Pulmonary:     Effort: Pulmonary effort is normal.     Breath sounds: Normal breath sounds.  Neurological:     Mental Status: He is alert and oriented to person, place, and time.     Cranial Nerves: No cranial nerve deficit.     Motor: No weakness.  Psychiatric:        Mood and Affect: Mood normal.        Behavior: Behavior normal.        Thought Content: Thought content normal.     Results for orders placed or performed in visit on 07/05/24  POCT Glucose (CBG)  Result Value Ref Range   POC Glucose 173 (A) 70 - 99 mg/dl      Assessment & Plan:    Problem List Items Addressed This Visit     Postural dizziness   Onset 1week ago, constant dizzness but worse to rapid change in postiion (sitting to standing) or head movement. No syncope, no palpitation, no tinnitus, no nausea. He had similar symptoms in 2021, resolved with adequate oral hydration in the past. 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 Labs completed by podiatry 07/03/24: stable CBC, and BMP Urinalysis indicates specific gravity at 1.041 with positive protein  Suspect symptoms are due to dehydration Decrease olmesartan  dose to 20mg  daily Advised to Maintain at least 64-70oz of water daily Decrease olmesartan  dose to 20mg  daily Change position slowly. Provided work note. F/up in 2weeks with Dr. Thedora or myself      Other Visit Diagnoses       Orthostatic hypotension    -  Primary     Type 2 diabetes mellitus without complication, unspecified whether long term insulin  use (HCC)       Relevant Orders   POCT Glucose (CBG) (Completed)     Dehydration          Return in about 2 weeks (around 07/19/2024) for postural dizziness and HTN.     Roselie Mood, NP

## 2024-07-05 NOTE — Telephone Encounter (Signed)
  FYI Only or Action Required?: FYI only for provider.  Patient was last seen in primary care on 05/12/2024 by Thedora Garnette HERO, MD.  Called Nurse Triage reporting Dizziness.  Symptoms began several days ago.  Interventions attempted: Nothing.  Symptoms are: unchanged.  Triage Disposition: See Physician Within 24 Hours  Patient/caregiver understands and will follow disposition?: Yes                             Copied from CRM [id number]. Topic: Clinical - Red Word Triage >> Jul 05, 2024 11:19 AM Rosina BIRCH wrote: Red Word that prompted transfer to Nurse Triage: patient called stating for the last few days he has been feeling dizzy like he is about to pass out  1. DESCRIPTION: Describe your dizziness. Both lightheadedness and room spinning   2. LIGHTHEADED: Do you feel lightheaded? (e.g., somewhat faint, woozy, weak upon standing) States he feels like has come close to fainting/falling  3. VERTIGO: Do you feel like either you or the room is spinning or tilting? (i.e., vertigo) Had vertigo about 5 years ago   4. SEVERITY: How bad is it? Do you feel like you are going to faint? Can you stand and walk? - MILD: Feels slightly dizzy, but walking normally. - MODERATE: Feels unsteady when walking, but not falling; interferes with normal activities (e.g., school, work). - SEVERE: Unable to walk without falling, or requires assistance to walk without falling; feels like passing out now.  Moderate  5. ONSET: When did the dizziness begin? A few days   6. AGGRAVATING FACTORS: Does anything make it worse? (e.g., standing, change in head position) Sitting to standing   8. CAUSE: What do you think is causing the dizziness? (e.g., decreased fluids or food, diarrhea, emotional distress, heat exposure, new medicine, sudden standing, vomiting; unknown) Unsure  9. RECURRENT SYMPTOM: Have you had dizziness before? If Yes, ask: When was the last time?  What happened that time? 5 years ago due to vertigo  10. OTHER SYMPTOMS: Do you have any other symptoms? (e.g., fever, chest pain, vomiting, diarrhea, bleeding) Denies nausea, denies fever, denies headache, denies changes in speech/vision, denies numbness Reason for Disposition  [1] MODERATE dizziness (e.g., interferes with normal activities) AND [2] has NOT been evaluated by doctor (or NP/PA) for this  (Exception: Dizziness caused by heat exposure, sudden standing, or poor fluid intake.)  Protocols used: Dizziness - Lightheadedness-A-AH

## 2024-07-05 NOTE — Patient Instructions (Signed)
 Maintain at least 64oz of water daily Decrease olmesartan  dose to 20mg  daily Change position slowly. F/up in 2weeks with Dr. Thedora or myself

## 2024-07-05 NOTE — Telephone Encounter (Signed)
 Patient calling to see if he can get his antibiotic sent over. He states his lab work shows  no gout.

## 2024-07-05 NOTE — Telephone Encounter (Signed)
 Seeing Gary Grimes today. Dm/cma

## 2024-07-05 NOTE — Assessment & Plan Note (Signed)
 Onset 1week ago, constant dizzness but worse to rapid change in postiion (sitting to standing) or head movement. No syncope, no palpitation, no tinnitus, no nausea. He had similar symptoms in 2021, resolved with adequate oral hydration in the past. 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 Labs completed by podiatry 07/03/24: stable CBC, and BMP Urinalysis indicates specific gravity at 1.041 with positive protein  Suspect symptoms are due to dehydration Decrease olmesartan  dose to 20mg  daily Advised to Maintain at least 64-70oz of water daily Decrease olmesartan  dose to 20mg  daily Change position slowly. Provided work note. F/up in 2weeks with Dr. Thedora or myself

## 2024-07-12 ENCOUNTER — Ambulatory Visit: Payer: Self-pay

## 2024-07-12 DIAGNOSIS — F3160 Bipolar disorder, current episode mixed, unspecified: Secondary | ICD-10-CM | POA: Diagnosis not present

## 2024-07-12 NOTE — Telephone Encounter (Signed)
 FYI Only or Action Required?: FYI only for provider.  Patient was last seen in primary care on 07/05/2024 by Nche, Roselie Rockford, NP.  Called Nurse Triage reporting PCP Call.  Symptoms began several days ago.  Interventions attempted: Nothing.  Symptoms are: unchanged.  Triage Disposition: Call PCP Within 24 Hours  Patient/caregiver understands and will follow disposition?: Yes  **Pt. Has appt. On 7/22**             Copied from CRM #[id number]. Topic: Clinical - Red Word Triage >> Jul 12, 2024  4:01 PM Rosina BIRCH wrote: Red Word that prompted transfer to Nurse Triage: patient has been having severe nightmares and his therapist told him to contact his provider regarding medication-Prazocin Reason for Disposition  [1] Caller requests to speak ONLY to PCP AND [2] NON-URGENT question  Answer Assessment - Initial Assessment Questions 1. REASON FOR CALL or QUESTION: What is your reason for calling today?  Patient has an upcomming appointment on 7/22 with PCP, but is requesting the medication Prazocin be prescribed as he is having nightmares. Pt. Was advised to discuss this with provider at office visit.  Protocols used: PCP Call - No Triage-A-AH

## 2024-07-13 NOTE — Telephone Encounter (Signed)
 Called and spoke to patient, he is okay with waiting till his appointment on 07/19/24.  Dm/cma

## 2024-07-17 ENCOUNTER — Telehealth: Payer: Self-pay | Admitting: Orthopedic Surgery

## 2024-07-17 NOTE — Telephone Encounter (Signed)
 This patient lvm requesting gel injections starting next month, please advise if ok.  His knees are starting to bother him again.  [phone]

## 2024-07-18 ENCOUNTER — Other Ambulatory Visit: Payer: Self-pay | Admitting: Family Medicine

## 2024-07-18 ENCOUNTER — Ambulatory Visit: Admitting: Cardiology

## 2024-07-18 DIAGNOSIS — Z794 Long term (current) use of insulin: Secondary | ICD-10-CM

## 2024-07-18 NOTE — Telephone Encounter (Signed)
 Last OV 07/05/2024 with KYM Mood, NP

## 2024-07-18 NOTE — Telephone Encounter (Unsigned)
 Copied from CRM [id number]. Topic: Clinical - Medication Refill >> Jul 18, 2024  9:35 AM Alexandria E wrote: Medication: tirzepatide  (MOUNJARO ) 12.5 MG/0.5ML Pen  Has the patient contacted their pharmacy? No (Agent: If no, request that the patient contact the pharmacy for the refill. If patient does not wish to contact the pharmacy document the reason why and proceed with request.) (Agent: If yes, when and what did the pharmacy advise?)  This is the patient's preferred pharmacy:  [facility] [place], [place] - [address] [place] [address] Phone: [phone] Fax: [phone]   Is this the correct pharmacy for this prescription? Yes If no, delete pharmacy and type the correct one.   Has the prescription been filled recently? No  Is the patient out of the medication? Yes  Has the patient been seen for an appointment in the last year OR does the patient have an upcoming appointment? Yes  Can we respond through MyChart? Yes  Agent: Please be advised that Rx refills may take up to 3 business days. We ask that you follow-up with your pharmacy.

## 2024-07-19 ENCOUNTER — Ambulatory Visit: Admitting: Family Medicine

## 2024-07-19 ENCOUNTER — Encounter: Payer: Self-pay | Admitting: Family Medicine

## 2024-07-19 VITALS — BP 124/70 | HR 86 | Temp 97.6°F | Ht 71.0 in | Wt 271.6 lb

## 2024-07-19 DIAGNOSIS — I951 Orthostatic hypotension: Secondary | ICD-10-CM | POA: Diagnosis not present

## 2024-07-19 DIAGNOSIS — F515 Nightmare disorder: Secondary | ICD-10-CM | POA: Diagnosis not present

## 2024-07-19 DIAGNOSIS — Z6837 Body mass index (BMI) 37.0-37.9, adult: Secondary | ICD-10-CM | POA: Diagnosis not present

## 2024-07-19 DIAGNOSIS — E66812 Obesity, class 2: Secondary | ICD-10-CM | POA: Diagnosis not present

## 2024-07-19 DIAGNOSIS — F3160 Bipolar disorder, current episode mixed, unspecified: Secondary | ICD-10-CM | POA: Diagnosis not present

## 2024-07-19 MED ORDER — TIRZEPATIDE 12.5 MG/0.5ML ~~LOC~~ SOAJ: 12.5000 mg | SUBCUTANEOUS | 3 refills | Status: DC

## 2024-07-19 MED ORDER — PRAZOSIN HCL 1 MG PO CAPS: 1.0000 mg | ORAL_CAPSULE | Freq: Every day | ORAL | 1 refills | Status: DC
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
## 2024-07-19 NOTE — Assessment & Plan Note (Signed)
 Maximum weight: 325 lbs (04/2023) Current weight: 271 lbs Weight change since last visit: - 16 lbs Total weight loss: - 54 lbs (16.6 %)  Gary Grimes has obesity with multiple serious co-morbidities. He is doing well on tirzepatide  (Mounjaro ) 12.5 mg weekly for diabetes management and weight loss.
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
## 2024-07-19 NOTE — Telephone Encounter (Signed)
 VOB submitted for Orthovisc, bilateral knee  Next available appt.for gel injection needs to be after 08/24/2024

## 2024-07-20 ENCOUNTER — Other Ambulatory Visit: Payer: Self-pay

## 2024-07-20 ENCOUNTER — Telehealth: Payer: Self-pay

## 2024-07-20 ENCOUNTER — Telehealth: Payer: Self-pay | Admitting: Orthopedic Surgery

## 2024-07-20 DIAGNOSIS — M1712 Unilateral primary osteoarthritis, left knee: Secondary | ICD-10-CM

## 2024-07-20 NOTE — Telephone Encounter (Signed)
 Called the pt and lvm for him to cb and schedule his gel injections:  Approved for Orthovisc, bilateral knee Buy & Bill Patient will be responsible for 20% OOP Co-pay of $5.00 per date of service No PA required

## 2024-07-20 NOTE — Telephone Encounter (Signed)
 Please schedule patient 3 appts.with Dr. Margrette for gel injections to be received after 08/24/2024.  See referrals tab for gel information.

## 2024-07-22 DIAGNOSIS — G4733 Obstructive sleep apnea (adult) (pediatric): Secondary | ICD-10-CM | POA: Diagnosis not present

## 2024-07-26 DIAGNOSIS — F3160 Bipolar disorder, current episode mixed, unspecified: Secondary | ICD-10-CM | POA: Diagnosis not present

## 2024-07-28 ENCOUNTER — Encounter: Payer: Self-pay | Admitting: Student

## 2024-07-28 ENCOUNTER — Other Ambulatory Visit: Payer: Self-pay | Admitting: Internal Medicine

## 2024-07-28 ENCOUNTER — Ambulatory Visit: Attending: Student | Admitting: Student

## 2024-07-28 VITALS — BP 100/62 | HR 62 | Ht 71.0 in | Wt 272.0 lb

## 2024-07-28 DIAGNOSIS — I480 Paroxysmal atrial fibrillation: Secondary | ICD-10-CM

## 2024-07-28 DIAGNOSIS — G4733 Obstructive sleep apnea (adult) (pediatric): Secondary | ICD-10-CM

## 2024-07-28 DIAGNOSIS — E1140 Type 2 diabetes mellitus with diabetic neuropathy, unspecified: Secondary | ICD-10-CM | POA: Diagnosis not present

## 2024-07-28 DIAGNOSIS — I10 Essential (primary) hypertension: Secondary | ICD-10-CM | POA: Diagnosis not present

## 2024-07-28 DIAGNOSIS — I2510 Atherosclerotic heart disease of native coronary artery without angina pectoris: Secondary | ICD-10-CM | POA: Diagnosis not present

## 2024-07-28 NOTE — Progress Notes (Signed)
  Electrophysiology Office Note:   Date:  07/28/2024  ID:  KENROY TIMBERMAN, DOB March 09, 1961, MRN [id number]  Primary Cardiologist: None Electrophysiologist: Will Gladis Norton, MD   Electrophysiologist:  Soyla Gladis Norton, MD      History of Present Illness:   Gary Grimes is a 63 y.o. male with h/o HTN, T2DM, CAC score of 1122, HLD, sleep apnea, and atrial fibrillation seen today for routine electrophysiology followup.   Since last being seen in our clinic the patient reports doing very well. No breakthrough AF of which he is aware. Overall, he denies chest pain, palpitations, dyspnea, PND, orthopnea, nausea, vomiting, dizziness, syncope, edema, weight gain, or early satiety.   Review of systems complete and found to be negative unless listed in HPI.   EP Information / Studies Reviewed:    EKG is ordered today. Personal review as below.  EKG Interpretation Date/Time:  Friday July 28 2024 12:00:15 EDT Ventricular Rate:  63 PR Interval:  192 QRS Duration:  94 QT Interval:  448 QTC Calculation: 458 R Axis:   62  Text Interpretation: Normal sinus rhythm Normal ECG When compared with ECG of 07-Jun-2024 13:56, No significant change was found Confirmed by Lesia Sharper [phone]) on 07/28/2024 12:12:35 PM    Arrhythmia/Device History S/p PVI and CTI 04/27/2024   Physical Exam:   VS:  BP 100/62   Pulse 62   Ht 5' 11 (1.803 m)   Wt 272 lb (123.4 kg)   SpO2 97%   BMI 37.94 kg/m    Wt Readings from Last 3 Encounters:  07/28/24 272 lb (123.4 kg)  07/19/24 271 lb 9.6 oz (123.2 kg)  07/05/24 274 lb 12.8 oz (124.6 kg)     GEN: No acute distress NECK: No JVD; No carotid bruits CARDIAC: Regular rate and rhythm, no murmurs, rubs, gallops RESPIRATORY:  Clear to auscultation without rales, wheezing or rhonchi  ABDOMEN: Soft, non-tender, non-distended EXTREMITIES:  No edema; No deformity   ASSESSMENT AND PLAN:    Persistent AF EKG today shows NSR S/p ablation 04/27/2024 STOP  amiodarone  Continue eliquis  5 mg BID for CHA2DS2/VASc of at least 4.   Secondary hypercoagulable state Pt on Eliquis  as above  He would be interested in Watchman procedure and wishes to try and come off Eliquis . Will forward to Dr. Deno and partners for consideration.   CAD No s/s of ischemia.     HTN Stable on current regimen   OSA  Encouraged nightly CPAP    Follow up with EP Team in 6 months  Signed, Sharper Prentice Lesia, PA-C

## 2024-07-28 NOTE — Patient Instructions (Signed)
 Medication Instructions:  Stop amiodarone  *If you need a refill on your cardiac medications before your next appointment, please call your pharmacy*  Lab Work: None ordered If you have labs (blood work) drawn today and your tests are completely normal, you will receive your results only by: MyChart Message (if you have MyChart) OR A paper copy in the mail If you have any lab test that is abnormal or we need to change your treatment, we will call you to review the results.  Follow-Up: At [facility], you and your health needs are our priority.  As part of our continuing mission to provide you with exceptional heart care, our providers are all part of one team.  This team includes your primary Cardiologist (physician) and Advanced Practice Providers or APPs (Physician Assistants and Nurse Practitioners) who all work together to provide you with the care you need, when you need it.  Your next appointment:   6 month(s)  Provider:   Soyla Norton, MD or Ozell Jodie Passey, PA-C

## 2024-07-28 NOTE — Telephone Encounter (Signed)
 Patient called to follow up on this refill request.  Call back # with questions or clarification [phone]

## 2024-07-31 ENCOUNTER — Ambulatory Visit: Payer: Self-pay

## 2024-07-31 NOTE — Telephone Encounter (Signed)
 FYI Only or Action Required?: Action required by provider: update on patient condition.  Patient was last seen in primary care on 07/19/2024 by Thedora Garnette HERO, MD.  Called Nurse Triage reporting Dizziness.  Symptoms began several weeks ago.  Interventions attempted: Nothing.  Symptoms are: rapidly worsening.  Triage Disposition: Go to ED Now (or PCP Triage)  Patient/caregiver understands and will follow disposition?: No, wishes to speak with PCPCopied from CRM #[id number]. Topic: Clinical - Red Word Triage >> Jul 31, 2024  3:15 PM Burnard DEL wrote: Red Word that prompted transfer to Nurse Triage: ongoing dizziness Reason for Disposition  SEVERE dizziness (e.g., unable to stand, requires support to walk, feels like passing out now)  Answer Assessment - Initial Assessment Questions Happens while walking and from sitting to standing- every time. Pt is afraid he is going to fall every time he walks. No falls at this point. Pt is afraid to drive. Pt is requested PCP to call in meclizine . Due to severity, RN advised ED but pt is refusing at the time until PCP directs. CAL notified.      1. DESCRIPTION: Describe your dizziness.     I feel like I'm going to pass out 2. LIGHTHEADED: Do you feel lightheaded? (e.g., somewhat faint, woozy, weak upon standing)     Woozy- 3. VERTIGO: Do you feel like either you or the room is spinning or tilting? (i.e., vertigo)     yes 4. SEVERITY: How bad is it?  Do you feel like you are going to faint? Can you stand and walk?     Yes- no falls 5. ONSET:  When did the dizziness begin?     6 weeks 6. AGGRAVATING FACTORS: Does anything make it worse? (e.g., standing, change in head position)     Movement  7. HEART RATE: Can you tell me your heart rate? How many beats in 15 seconds?  (Note: Not all patients can do this.)       Had ablation on may 1 8. CAUSE: What do you think is causing the dizziness? (e.g., decreased fluids or food,  diarrhea, emotional distress, heat exposure, new medicine, sudden standing, vomiting; unknown)     Not sure 9. RECURRENT SYMPTOM: Have you had dizziness before? If Yes, ask: When was the last time? What happened that time?     yes 10. OTHER SYMPTOMS: Do you have any other symptoms? (e.g., fever, chest pain, vomiting, diarrhea, bleeding)      denies  Protocols used: Dizziness - Lightheadedness-A-AH

## 2024-07-31 NOTE — Telephone Encounter (Signed)
 Spoke to patient and he voiced dizziness when standing and feeling lightheaded. I did advise patient an ED visit is needed. Pt declined and stated it is to long of a wait and rather be seen in clinic; Pt doesn't feel comfortable with driving. OV for tomorrow with Rosina Senters NP was scheduled at 1:20pm. Pt verbalized understanding and a thank you.

## 2024-08-01 ENCOUNTER — Other Ambulatory Visit: Payer: Self-pay | Admitting: Family Medicine

## 2024-08-01 ENCOUNTER — Ambulatory Visit (INDEPENDENT_AMBULATORY_CARE_PROVIDER_SITE_OTHER): Admitting: Internal Medicine

## 2024-08-01 ENCOUNTER — Encounter: Payer: Self-pay | Admitting: Internal Medicine

## 2024-08-01 VITALS — BP 94/62 | HR 77 | Temp 98.0°F | Ht 71.0 in | Wt 270.8 lb

## 2024-08-01 DIAGNOSIS — F900 Attention-deficit hyperactivity disorder, predominantly inattentive type: Secondary | ICD-10-CM

## 2024-08-01 DIAGNOSIS — I951 Orthostatic hypotension: Secondary | ICD-10-CM

## 2024-08-01 NOTE — Patient Instructions (Addendum)
  Omron Blood pressure arm cuff  - on [employer], [employer], [facility] pharmacy Check blood pressure at home once daily Continue to drink plenty of water   STOP Olmesartan
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
## 2024-08-02 ENCOUNTER — Encounter: Payer: Self-pay | Admitting: Internal Medicine

## 2024-08-02 DIAGNOSIS — F3160 Bipolar disorder, current episode mixed, unspecified: Secondary | ICD-10-CM | POA: Diagnosis not present

## 2024-08-02 NOTE — Telephone Encounter (Signed)
 err

## 2024-08-08 ENCOUNTER — Ambulatory Visit: Admitting: Family Medicine

## 2024-08-09 DIAGNOSIS — F3160 Bipolar disorder, current episode mixed, unspecified: Secondary | ICD-10-CM | POA: Diagnosis not present

## 2024-08-09 NOTE — Progress Notes (Signed)
 Left detailed msg for pt, okay per DPR.

## 2024-08-10 ENCOUNTER — Encounter: Payer: Self-pay | Admitting: Family Medicine

## 2024-08-10 ENCOUNTER — Ambulatory Visit (INDEPENDENT_AMBULATORY_CARE_PROVIDER_SITE_OTHER): Admitting: Family Medicine

## 2024-08-10 VITALS — BP 136/78 | HR 78 | Temp 97.6°F | Ht 71.0 in | Wt 262.6 lb

## 2024-08-10 DIAGNOSIS — F515 Nightmare disorder: Secondary | ICD-10-CM | POA: Diagnosis not present

## 2024-08-10 DIAGNOSIS — Z6841 Body Mass Index (BMI) 40.0 and over, adult: Secondary | ICD-10-CM | POA: Diagnosis not present

## 2024-08-10 DIAGNOSIS — Z7985 Long-term (current) use of injectable non-insulin antidiabetic drugs: Secondary | ICD-10-CM | POA: Diagnosis not present

## 2024-08-10 DIAGNOSIS — I951 Orthostatic hypotension: Secondary | ICD-10-CM
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
## 2024-08-10 NOTE — Assessment & Plan Note (Signed)
 Maximum weight: 325 lbs (04/2023) Current weight: 262 lbs Weight change since last visit: - 9 lbs Total weight loss: - 63 lbs (19.4 %)  Gary Grimes has obesity with multiple serious co-morbidities. He is doing well on tirzepatide  (Mounjaro ) 12.5 mg weekly for diabetes management and weight loss. Encouraged him about resuming regular exercise to promote weight loss and lower CV risk.

## 2024-08-10 NOTE — Assessment & Plan Note (Signed)
 Improved. Continue prazosin  1 mg at bedtime. We did discuss that this can lower blood pressure, so which monitor for ay intolerable orthostasis.

## 2024-08-15 ENCOUNTER — Other Ambulatory Visit: Payer: Self-pay

## 2024-08-15 DIAGNOSIS — E1140 Type 2 diabetes mellitus with diabetic neuropathy, unspecified: Secondary | ICD-10-CM

## 2024-08-15 NOTE — Telephone Encounter (Signed)
Please review message and advise.   Thanks. Dm/cma  

## 2024-08-15 NOTE — Telephone Encounter (Signed)
 Copied from CRM [id number]. Topic: Clinical - Medication Question >> Aug 15, 2024 11:25 AM Deaijah H wrote: Reason for CRM: Patient would like to know if Dr. Thedora would jump tirzepatide  (MOUNJARO ) 12.5 MG/0.5ML Pen up to 15 MG. Please call [phone]

## 2024-08-16 DIAGNOSIS — F3160 Bipolar disorder, current episode mixed, unspecified: Secondary | ICD-10-CM | POA: Diagnosis not present

## 2024-08-16 NOTE — Telephone Encounter (Signed)
 Will call him this morning Dm/cma

## 2024-08-17 ENCOUNTER — Ambulatory Visit: Admitting: Family Medicine

## 2024-08-17 MED ORDER — TIRZEPATIDE 12.5 MG/0.5ML ~~LOC~~ SOAJ: 12.5000 mg | SUBCUTANEOUS | 3 refills | Status: DC

## 2024-08-17 NOTE — Telephone Encounter (Signed)
 Spoke to patient he is okay with staying on the dose he is on now and needs a refill.  Dm/cma

## 2024-08-17 NOTE — Telephone Encounter (Signed)
Rx sent to the pharmacy and patient notified VIA phone.  Dm/cma  

## 2024-08-17 NOTE — Addendum Note (Signed)
 Addended by: KYM KARNA CROME on: 08/17/2024 08:35 AM   Modules accepted: Orders

## 2024-08-22 DIAGNOSIS — G4733 Obstructive sleep apnea (adult) (pediatric): Secondary | ICD-10-CM | POA: Diagnosis not present

## 2024-08-23 DIAGNOSIS — F3160 Bipolar disorder, current episode mixed, unspecified: Secondary | ICD-10-CM | POA: Diagnosis not present
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
## 2024-08-24 ENCOUNTER — Ambulatory Visit (INDEPENDENT_AMBULATORY_CARE_PROVIDER_SITE_OTHER): Admitting: Internal Medicine

## 2024-08-24 ENCOUNTER — Encounter: Payer: Self-pay | Admitting: Internal Medicine

## 2024-08-24 VITALS — BP 144/85 | HR 63 | Temp 98.3°F | Ht 71.0 in | Wt 265.8 lb

## 2024-08-24 DIAGNOSIS — G4733 Obstructive sleep apnea (adult) (pediatric): Secondary | ICD-10-CM

## 2024-08-24 NOTE — Patient Instructions (Signed)
 Glad you are doing well- we can continue CPAP 13 since it is doing fine for you.

## 2024-08-28 ENCOUNTER — Encounter: Payer: Self-pay | Admitting: Internal Medicine

## 2024-08-30 ENCOUNTER — Ambulatory Visit: Payer: Self-pay

## 2024-08-30 NOTE — Telephone Encounter (Signed)
 Please advise and call pt.   FYI Only or Action Required?: Action required by provider: clinical question for provider.  Patient was last seen in primary care on 08/10/2024 by Thedora Garnette HERO, MD.  Called Nurse Triage reporting Hypertension.  Symptoms began a week ago.  Interventions attempted: Nothing.  Symptoms are: unchanged.  Triage Disposition: Discuss With PCP and Callback by Nurse Today (overriding See PCP Within 2 Weeks)  Patient/caregiver understands and will follow disposition?: Yes Reason for Disposition  [1] Systolic BP >= 130 OR Diastolic >= 80 AND [2] not taking BP medications  Answer Assessment - Initial Assessment Questions Patient asking if he should re-start antihypertensive. Patient denies higher acuity questions. ED precautions reviewed, pt verbalized understanding.  1. BLOOD PRESSURE: What is your blood pressure? Did you take at least two measurements 5 minutes apart?     146-150/85-90 for one week  2. ONSET: When did you take your blood pressure?     Daily  3. HOW: How did you take your blood pressure? (e.g., automatic home BP monitor, visiting nurse)     Machine  4. HISTORY: Do you have a history of high blood pressure?     Yes, olmesartan  recently discontinued d/t hypotension  Protocols used: Blood Pressure - High-A-AH Copied from CRM #[id number]. Topic: Clinical - Red Word Triage >> Aug 30, 2024  4:56 PM Chiquita SQUIBB wrote: Red Word that prompted transfer to Nurse Triage: Patient is calling in stating his blood pressure has been high the last few days and is unsure what to do because he was taken off his BP medication.

## 2024-08-31 NOTE — Telephone Encounter (Signed)
 Called patient after speaking to provider and scheduled him an appointmetn for 09/05/24 @ 4 pm. Dm/cma

## 2024-09-01 ENCOUNTER — Ambulatory Visit: Admitting: Orthopedic Surgery

## 2024-09-05 ENCOUNTER — Ambulatory Visit (INDEPENDENT_AMBULATORY_CARE_PROVIDER_SITE_OTHER): Admitting: Family Medicine

## 2024-09-05 ENCOUNTER — Encounter: Payer: Self-pay | Admitting: Family Medicine

## 2024-09-05 VITALS — BP 144/84 | HR 67 | Temp 97.4°F | Ht 71.0 in | Wt 270.2 lb

## 2024-09-05 DIAGNOSIS — E1140 Type 2 diabetes mellitus with diabetic neuropathy, unspecified: Secondary | ICD-10-CM | POA: Diagnosis not present

## 2024-09-05 DIAGNOSIS — Z7985 Long-term (current) use of injectable non-insulin antidiabetic drugs: Secondary | ICD-10-CM | POA: Diagnosis not present

## 2024-09-05 DIAGNOSIS — Z794 Long term (current) use of insulin: Secondary | ICD-10-CM

## 2024-09-05 DIAGNOSIS — Z7984 Long term (current) use of oral hypoglycemic drugs: Secondary | ICD-10-CM | POA: Diagnosis not present

## 2024-09-05 DIAGNOSIS — R110 Nausea: Secondary | ICD-10-CM | POA: Diagnosis not present

## 2024-09-05 DIAGNOSIS — Z6841 Body Mass Index (BMI) 40.0 and over, adult: Secondary | ICD-10-CM

## 2024-09-05 DIAGNOSIS — I10 Essential (primary) hypertension: Secondary | ICD-10-CM | POA: Diagnosis not present

## 2024-09-05 MED ORDER — TIRZEPATIDE 15 MG/0.5ML ~~LOC~~ SOAJ: 15.0000 mg | SUBCUTANEOUS | 3 refills | Status: DC

## 2024-09-05 MED ORDER — OLMESARTAN MEDOXOMIL 20 MG PO TABS: 20.0000 mg | ORAL_TABLET | Freq: Every day | ORAL | 3 refills | Status: DC

## 2024-09-05 MED ORDER — ONDANSETRON HCL 4 MG PO TABS
4.0000 mg | ORAL_TABLET | Freq: Four times a day (QID) | ORAL | 0 refills | Status: AC
Start: 2024-09-05 — End: ?

## 2024-09-05 NOTE — Assessment & Plan Note (Signed)
 Maximum weight: 325 lbs (04/2023) Current weight: 270 lbs Weight change since last visit: + 8 lbs Total weight loss: - 55 lbs (16.9 %)  Gary Grimes has obesity with multiple serious co-morbidities. He has had some recent weight regain. I will increase his tirzepatide  (Mounjaro ) to 15 mg weekly for diabetes management and weight loss. He is in the gym 3-4 times a week doing 20 min. on a stationary bike. Encouraged him to continue and set a goal to increase to 30-min. intervals for his CV exercise.
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
## 2024-09-05 NOTE — Assessment & Plan Note (Addendum)
 Blood pressure is elevated today. We will restart his olmesartan  at 20 mg daily (half his former dose). I recommend he return in 6 weeks to reassess this.

## 2024-09-05 NOTE — Assessment & Plan Note (Signed)
 Diabetes is improving. Continue  U-500 and Tresiba  as managed by Dr. Trixie. I will increase his tirzepatide  (Mounjaro ) to 15 mg weekly.

## 2024-09-08 ENCOUNTER — Telehealth: Payer: Self-pay | Admitting: Physical Medicine and Rehabilitation

## 2024-09-08 ENCOUNTER — Ambulatory Visit: Admitting: Orthopedic Surgery

## 2024-09-08 ENCOUNTER — Encounter: Payer: Self-pay | Admitting: Orthopedic Surgery

## 2024-09-08 DIAGNOSIS — M1712 Unilateral primary osteoarthritis, left knee: Secondary | ICD-10-CM

## 2024-09-08 DIAGNOSIS — M170 Bilateral primary osteoarthritis of knee: Secondary | ICD-10-CM

## 2024-09-08 DIAGNOSIS — M1711 Unilateral primary osteoarthritis, right knee: Secondary | ICD-10-CM

## 2024-09-08 MED ORDER — HYALURONAN 30 MG/2ML IX SOSY
30.0000 mg | PREFILLED_SYRINGE | INTRA_ARTICULAR | Status: AC
  Administered 2024-09-08 – 2024-09-27 (×3): 30 mg via INTRA_ARTICULAR

## 2024-09-08 NOTE — Telephone Encounter (Signed)
 Patient called. Would like an appointment with Dr. Alvester Morin

## 2024-09-08 NOTE — Progress Notes (Signed)
 Patient: Gary Grimes           Date of Birth: 29-Dec-1960           MRN: [id number] Visit Date: 09/08/2024 Requested by: Thedora Garnette HERO, MD [address] [place],  [place] [address] PCP: Thedora Garnette HERO, MD  Chief Complaint  Patient presents with   Injections    Both knees    Encounter Diagnoses  Name Primary?   Primary osteoarthritis of right knee Yes   Primary osteoarthritis of left knee     The patient has consented to and requested hyaluronic acid injection   MEDICATION: ORTHOVISC  bilaterally  THE RIGHT  knee is prepped with alcohol and ethyl chloride  The injection is performed with a 21-gauge needle, via inferolateral approach  No complications were noted  Appropriate instructions post injection were given   THIS WAS REPEATED ON THE LEFT KNEE   REPEAT IN 1 WEEK

## 2024-09-11 ENCOUNTER — Other Ambulatory Visit: Payer: Self-pay | Admitting: Physical Medicine and Rehabilitation

## 2024-09-11 MED ORDER — TRAMADOL HCL 50 MG PO TABS: 50.0000 mg | ORAL_TABLET | Freq: Three times a day (TID) | ORAL | 0 refills | Status: AC | PRN

## 2024-09-15 ENCOUNTER — Encounter: Payer: Self-pay | Admitting: Internal Medicine

## 2024-09-15 ENCOUNTER — Ambulatory Visit: Admitting: Orthopedic Surgery

## 2024-09-15 ENCOUNTER — Ambulatory Visit: Admitting: Internal Medicine

## 2024-09-15 VITALS — BP 120/60 | HR 95 | Ht 71.0 in | Wt 266.8 lb

## 2024-09-15 DIAGNOSIS — M1712 Unilateral primary osteoarthritis, left knee: Secondary | ICD-10-CM

## 2024-09-15 DIAGNOSIS — M170 Bilateral primary osteoarthritis of knee: Secondary | ICD-10-CM | POA: Diagnosis not present

## 2024-09-15 DIAGNOSIS — E119 Type 2 diabetes mellitus without complications: Secondary | ICD-10-CM | POA: Diagnosis not present

## 2024-09-15 DIAGNOSIS — E66812 Obesity, class 2: Secondary | ICD-10-CM | POA: Diagnosis not present

## 2024-09-15 DIAGNOSIS — E782 Mixed hyperlipidemia: Secondary | ICD-10-CM

## 2024-09-15 DIAGNOSIS — M1711 Unilateral primary osteoarthritis, right knee: Secondary | ICD-10-CM

## 2024-09-15 LAB — POCT GLYCOSYLATED HEMOGLOBIN (HGB A1C): Hemoglobin A1C: 7.8 % — AB (ref 4.0–5.6)

## 2024-09-15 MED ORDER — FREESTYLE LIBRE 3 PLUS SENSOR MISC: 1.0000 | 3 refills | Status: AC

## 2024-09-15 MED ORDER — HUMULIN R U-500 KWIKPEN 500 UNIT/ML ~~LOC~~ SOPN: PEN_INJECTOR | SUBCUTANEOUS | 3 refills | Status: AC

## 2024-09-15 MED ORDER — ATORVASTATIN CALCIUM 10 MG PO TABS: 10.0000 mg | ORAL_TABLET | Freq: Every day | ORAL | 3 refills | Status: AC

## 2024-09-15 MED ORDER — FREESTYLE LIBRE 3 READER DEVI
1.0000 | Freq: Once | 0 refills | Status: AC
Start: 2024-09-15 — End: 2024-09-15

## 2024-09-15 NOTE — Patient Instructions (Addendum)
 Please stop Tresiba  and NovoLog  and restart: - U500 40-50 units before brunch 20-30 units before snack in the evening   Increase: - Mounjaro  15 mg weekly as planned  Please return in 3 months.

## 2024-09-15 NOTE — Progress Notes (Signed)
   Chief Complaint  Patient presents with   Injections    Both knees    Patient presents for bilateral Orthovisc injections  Diagnosis osteoarthritis both knees  Patient: Gary Grimes           Date of Birth: [birth year]           MRN: [id number] Visit Date: 09/15/2024 Requested by: Thedora Garnette HERO, MD [address] [place],  [place] [address] PCP: Thedora Garnette HERO, MD  Chief Complaint  Patient presents with   Injections    Both knees    Encounter Diagnoses  Name Primary?   Primary osteoarthritis of right knee Yes   Primary osteoarthritis of left knee     The patient has consented to and requested hyaluronic acid injection   MEDICATION: Orthovisc  bilaterally  THE left  knee is prepped with alcohol and ethyl chloride  The injection is performed with a 21-gauge needle, via inferolateral approach  No complications were noted  Appropriate instructions post injection were given   Procedure repeated for the right knee
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
## 2024-09-19 ENCOUNTER — Ambulatory Visit (INDEPENDENT_AMBULATORY_CARE_PROVIDER_SITE_OTHER): Admitting: Podiatry

## 2024-09-19 VITALS — Ht 71.0 in | Wt 266.8 lb

## 2024-09-19 DIAGNOSIS — M216X1 Other acquired deformities of right foot: Secondary | ICD-10-CM

## 2024-09-19 DIAGNOSIS — M2032 Hallux varus (acquired), left foot: Secondary | ICD-10-CM

## 2024-09-19 DIAGNOSIS — E1140 Type 2 diabetes mellitus with diabetic neuropathy, unspecified: Secondary | ICD-10-CM

## 2024-09-19 DIAGNOSIS — M216X2 Other acquired deformities of left foot: Secondary | ICD-10-CM | POA: Diagnosis not present

## 2024-09-19 DIAGNOSIS — M7752 Other enthesopathy of left foot: Secondary | ICD-10-CM

## 2024-09-19 NOTE — Progress Notes (Signed)
 Subjective:  Patient ID: Gary Grimes, male    DOB: 07/31/1961,  MRN: [id number]  Chief Complaint  Patient presents with   Foot Pain    Rm 11 Patient is here for right foot pain. Pain is described as throbbing pain,  a 6 on a scale of 1-10. Pt is using hot/cold therapy and insoles.    Discussed the use of AI scribe software for clinical note transcription with the patient, who gave verbal consent to proceed.  History of Present Illness Gary Grimes is a 63 year old male with diabetes who presents with severe left toe pain.   He experiences severe shooting, stabbing pain in his left toe, which can wake him at night and occasionally radiates up the foot and leg. Recently, he woke up with severe pain extending down into the left foot. There is possible swelling but no significant redness. Blood work showed a normal uric acid level of 4.9. Previous evaluations were conducted by Dr. Silva and Dr. Janit. There are no recent injuries or wounds to the area.  His blood sugar levels have been slightly elevated, with a recent reading of 215 mg/dL, and his last J8r was 7.8. He uses insulin  for management. He is on Eliquis , which precludes the use of NSAIDs, and uses Tylenol  Extra Strength and Voltaren  gel for pain management. He typically wears [employer] shoes.      Objective:  There were no vitals filed for this visit.  Physical Exam CARDIOVASCULAR: Peripheral circulation intact with DP, PT pulses 2/4 MUSCULOSKELETAL: On the left foot there is tenderness palpation of the hallux IPJ and hallux malleus is noted.  He does have some discomfort along the first MPJ as well as the medial aspect of first metatarsocuneiform joint but he states majority of his on the big toe.  On the right foot he has tenderness palpation along submetatarsal 5 for the prominent metatarsal head noted.  There is no areas of pinpoint tenderness identified otherwise.  There is no erythema or warmth of the  hallux. NEUROLOGICAL: Sensation intact in feet with Semmes Weinstein monofilament. DERMATOLOGICAL: No open lesions present  No images are attached to the encounter.    Results LABS Serum uric acid: 4.9 Blood glucose: 215 (09/18/2024) Hemoglobin A1c: 7.8 (09/15/2024)  RADIOLOGY Foot X-ray: Arthritis in the first metatarsophalangeal joint,  possible subtle erosions in the joint suggestive of previous gout on the first MPJ (06/2024)   Assessment:   1. Capsulitis of toe, left   2. Prominent metatarsal head, right   3. Acquired supination of left foot   4. Acquired supination of foot, right   5. Type 2 diabetes mellitus with diabetic neuropathy, unspecified whether long term insulin  use (HCC)      Plan:  Patient was evaluated and treated and all questions answered.  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 X-ray indicates joint narrowing and past gout, but current symptoms align with arthritis. - Administer steroid injection of the hallux IPJ.  Verbal consent obtained.  I cleaned the skin with alcohol and a mixture of 0.5 cc dispensing phosphate, 0.5 cc of Marcaine  plain was infiltrated to the hallux IPJ without complications.  Postinjection care was discussed.  Tolerated well. - Prescribe Voltaren  gel for topical anti-inflammatory treatment. - Submit authorization for custom shoe inserts to offload pressure.  Given history of diabetes with neuropathy, deformity I think he will benefit  from this. - Advise icing the toe post-injection to reduce swelling.  Right foot pain with possible bursitis due to foot structure Pain likely due to foot structure and walking pattern, with possible bursitis. Pressure on lateral foot exacerbates pain. - Submit authorization for custom shoe inserts to offload pressure. - Recommend use of Voltaren  gel for topical anti-inflammatory  treatment.  History of plantar fasciitis Plantar fasciitis likely contributing to foot pain, exacerbated by foot structure and walking pattern. - Submit authorization for custom shoe inserts to offload pressure.  Type 2 diabetes mellitus Type 2 diabetes mellitus with A1c of 7.8. Blood sugar management is crucial. - Avoid oral steroids to prevent exacerbation of hyperglycemia.      No follow-ups on file.   Donnice JONELLE Fees DPM

## 2024-09-19 NOTE — Patient Instructions (Signed)

## 2024-09-21 DIAGNOSIS — F3160 Bipolar disorder, current episode mixed, unspecified: Secondary | ICD-10-CM | POA: Diagnosis not present

## 2024-09-22 ENCOUNTER — Ambulatory Visit: Admitting: Orthopedic Surgery

## 2024-09-22 DIAGNOSIS — G4733 Obstructive sleep apnea (adult) (pediatric): Secondary | ICD-10-CM | POA: Diagnosis not present

## 2024-09-26 ENCOUNTER — Other Ambulatory Visit: Payer: Self-pay

## 2024-09-26 ENCOUNTER — Ambulatory Visit: Admitting: Physical Medicine and Rehabilitation

## 2024-09-26 VITALS — BP 130/80 | HR 80

## 2024-09-26 DIAGNOSIS — G4733 Obstructive sleep apnea (adult) (pediatric): Secondary | ICD-10-CM | POA: Diagnosis not present

## 2024-09-26 DIAGNOSIS — M5416 Radiculopathy, lumbar region: Secondary | ICD-10-CM

## 2024-09-26 DIAGNOSIS — R400 Somnolence: Secondary | ICD-10-CM | POA: Diagnosis not present

## 2024-09-26 MED ORDER — METHYLPREDNISOLONE ACETATE 80 MG/ML IJ SUSP
40.0000 mg | Freq: Once | INTRAMUSCULAR | Status: AC
  Administered 2024-09-26: 40 mg

## 2024-09-26 NOTE — Progress Notes (Signed)
 Pain Scale   Average Pain 7 Patient advising he has chronic lower back pain that is constant.        +Driver, -BT, -Dye Allergies.

## 2024-09-26 NOTE — Procedures (Signed)
 Lumbosacral Transforaminal Epidural Steroid Injection - Sub-Pedicular Approach with Fluoroscopic Guidance  Patient: Gary Grimes      Date of Birth: 1961/03/29 MRN: [id number] PCP: Thedora Garnette HERO, MD      Visit Date: 09/26/2024   Universal Protocol:    Date/Time: 09/26/2024  Consent Given By: the patient  Position: PRONE  Additional Comments: Vital signs were monitored before and after the procedure. Patient was prepped and draped in the usual sterile fashion. The correct patient, procedure, and site was verified.   Injection Procedure Details:   Procedure diagnoses: Lumbar radiculopathy [M54.16]    Meds Administered:  Meds ordered this encounter  Medications   methylPREDNISolone  acetate (DEPO-MEDROL ) injection 40 mg    Laterality: Left  Location/Site: L4  Needle:5.0 in., 22 ga.  Short bevel or Quincke spinal needle  Needle Placement: Transforaminal  Findings:    -Comments: Excellent flow of contrast along the nerve, nerve root and into the epidural space.  Procedure Details: After squaring off the end-plates to get a true AP view, the C-arm was positioned so that an oblique view of the foramen as noted above was visualized. The target area is just inferior to the nose of the scotty dog or sub pedicular. The soft tissues overlying this structure were infiltrated with 2-3 ml. of 1% Lidocaine  without Epinephrine .  The spinal needle was inserted toward the target using a trajectory view along the fluoroscope beam.  Under AP and lateral visualization, the needle was advanced so it did not puncture dura and was located close the 6 O'Clock position of the pedical in AP tracterory. Biplanar projections were used to confirm position. Aspiration was confirmed to be negative for CSF and/or blood. A 1-2 ml. volume of Isovue -250 was injected and flow of contrast was noted at each level. Radiographs were obtained for documentation purposes.   After attaining the desired flow  of contrast documented above, a 0.5 to 1.0 ml test dose of 0.25% Marcaine  was injected into each respective transforaminal space.  The patient was observed for 90 seconds post injection.  After no sensory deficits were reported, and normal lower extremity motor function was noted,   the above injectate was administered so that equal amounts of the injectate were placed at each foramen (level) into the transforaminal epidural space.   Additional Comments:  The patient tolerated the procedure well Dressing: 2 x 2 sterile gauze and Band-Aid    Post-procedure details: Patient was observed during the procedure. Post-procedure instructions were reviewed.  Patient left the clinic in stable condition.

## 2024-09-26 NOTE — Progress Notes (Signed)
 Gary Grimes - 63 y.o. male MRN [id number]  Date of birth: [birth year]  Office Visit Note: Visit Date: 09/26/2024 PCP: Thedora Garnette HERO, MD Referred by: Thedora Garnette HERO, MD  Subjective: No chief complaint on file.  HPI:  Gary Grimes is a 63 y.o. male who comes in today at the request of Dr. Taft Minerva for planned Left L4-5 Lumbar Transforaminal epidural steroid injection with fluoroscopic guidance.  The patient has failed conservative care including home exercise, medications, time and activity modification.  This injection will be diagnostic and hopefully therapeutic.  Please see requesting physician notes for further details and justification.   ROS Otherwise per HPI.  Assessment & Plan: Visit Diagnoses:    ICD-10-CM   1. Lumbar radiculopathy  M54.16 XR C-ARM NO REPORT    Epidural Steroid injection    methylPREDNISolone  acetate (DEPO-MEDROL ) injection 40 mg      Plan: No additional findings.   Meds & Orders:  Meds ordered this encounter  Medications   methylPREDNISolone  acetate (DEPO-MEDROL ) injection 40 mg    Orders Placed This Encounter  Procedures   XR C-ARM NO REPORT   Epidural Steroid injection    Follow-up: Return for visit to requesting provider as needed.   Procedures: No procedures performed  Lumbosacral Transforaminal Epidural Steroid Injection - Sub-Pedicular Approach with Fluoroscopic Guidance  Patient: Gary Grimes      Date of Birth: 07/03/1961 MRN: [id number] PCP: Thedora Garnette HERO, MD      Visit Date: 09/26/2024   Universal Protocol:    Date/Time: 09/26/2024  Consent Given By: the patient  Position: PRONE  Additional Comments: Vital signs were monitored before and after the procedure. Patient was prepped and draped in the usual sterile fashion. The correct patient, procedure, and site was verified.   Injection Procedure Details:   Procedure diagnoses: Lumbar radiculopathy [M54.16]    Meds Administered:  Meds ordered this  encounter  Medications   methylPREDNISolone  acetate (DEPO-MEDROL ) injection 40 mg    Laterality: Left  Location/Site: L4  Needle:5.0 in., 22 ga.  Short bevel or Quincke spinal needle  Needle Placement: Transforaminal  Findings:    -Comments: Excellent flow of contrast along the nerve, nerve root and into the epidural space.  Procedure Details: After squaring off the end-plates to get a true AP view, the C-arm was positioned so that an oblique view of the foramen as noted above was visualized. The target area is just inferior to the nose of the scotty dog or sub pedicular. The soft tissues overlying this structure were infiltrated with 2-3 ml. of 1% Lidocaine  without Epinephrine .  The spinal needle was inserted toward the target using a trajectory view along the fluoroscope beam.  Under AP and lateral visualization, the needle was advanced so it did not puncture dura and was located close the 6 O'Clock position of the pedical in AP tracterory. Biplanar projections were used to confirm position. Aspiration was confirmed to be negative for CSF and/or blood. A 1-2 ml. volume of Isovue -250 was injected and flow of contrast was noted at each level. Radiographs were obtained for documentation purposes.   After attaining the desired flow of contrast documented above, a 0.5 to 1.0 ml test dose of 0.25% Marcaine  was injected into each respective transforaminal space.  The patient was observed for 90 seconds post injection.  After no sensory deficits were reported, and normal lower extremity motor function was noted,   the above injectate was administered so that equal amounts of the  injectate were placed at each foramen (level) into the transforaminal epidural space.   Additional Comments:  The patient tolerated the procedure well Dressing: 2 x 2 sterile gauze and Band-Aid    Post-procedure details: Patient was observed during the procedure. Post-procedure instructions were  reviewed.  Patient left the clinic in stable condition.    Clinical History: MRI LUMBAR SPINE WITHOUT CONTRAST   TECHNIQUE: Multiplanar, multisequence MR imaging of the lumbar spine was performed. No intravenous contrast was administered.   COMPARISON:  CT Abdomen and Pelvis 03/10/2021.   FINDINGS: Segmentation: Normal on the comparison. Left side L5-S1 assimilation joint, normal variant.   Alignment: Stable lumbar lordosis since 2022. Subtle retrolisthesis of L3 on L4.   Vertebrae: Background T1 bone marrow signal is mildly heterogeneous but within normal limits. No marrow edema or evidence of acute osseous abnormality. Intact visible sacrum and SI joints.   Conus medullaris and cauda equina: Conus extends to the T12-L1 level. No lower spinal cord or conus signal abnormality. Unremarkable cauda equina nerve roots.   Paraspinal and other soft tissues: Negative; benign appearing right renal cyst redemonstrated (no follow-up imaging recommended). Negative visualized posterior paraspinal soft tissues.   Disc levels:   There is a degree of congenital spinal canal narrowing due to short pedicle distance (such as at the L2 level on series 5, image 14). The following superimposed degenerative changes are noted:   T11-T12: Subtle disc bulging and posterior element hypertrophy no significant stenosis.   T12-L1:  Negative.   L1-L2:  Negative.   L2-L3: Mild circumferential disc bulge eccentric to the left. Mild facet hypertrophy. Trace degenerative facet joint fluid. No significant stenosis.   L3-L4: Circumferential disc bulge. Mild ligament flavum and mild to moderate facet degenerative facet joint fluid (series 5 image 25). No significant spinal stenosis. Mild to moderate bilateral L3 foraminal stenosis appears greater on the left.   L4-L5: Mild circumferential disc bulge. Mild to moderate facet hypertrophy. No significant spinal stenosis. Moderate left and mild right  L4 neural foraminal stenosis.   L5-S1:  Mild facet hypertrophy.  No stenosis.   IMPRESSION: Mild congenital spinal canal narrowing. Superimposed lumbar disc bulging and facet degeneration. Overall no significant spinal stenosis, but up to moderate left neural foraminal stenosis at L3-L4 and L4-L5. Query Left L3 and/or L4 nerve levels.     Electronically Signed   By: VEAR Hurst M.D.   On: 08/09/2023 12:03     Objective:  VS:  HT:    WT:   BMI:     BP:   HR: bpm  TEMP: ( )  RESP:  Physical Exam Vitals and nursing note reviewed.  Constitutional:      General: He is not in acute distress.    Appearance: Normal appearance. He is not ill-appearing.  HENT:     Head: Normocephalic and atraumatic.     Right Ear: External ear normal.     Left Ear: External ear normal.     Nose: No congestion.  Eyes:     Extraocular Movements: Extraocular movements intact.  Cardiovascular:     Rate and Rhythm: Normal rate.     Pulses: Normal pulses.  Pulmonary:     Effort: Pulmonary effort is normal. No respiratory distress.  Abdominal:     General: There is no distension.     Palpations: Abdomen is soft.  Musculoskeletal:        General: No tenderness or signs of injury.     Cervical back: Neck supple.  Right lower leg: No edema.     Left lower leg: No edema.     Comments: Patient has good distal strength without clonus.  Skin:    Findings: No erythema or rash.  Neurological:     General: No focal deficit present.     Mental Status: He is alert and oriented to person, place, and time.     Sensory: No sensory deficit.     Motor: No weakness or abnormal muscle tone.     Coordination: Coordination normal.  Psychiatric:        Mood and Affect: Mood normal.        Behavior: Behavior normal.      Imaging: No results found.

## 2024-09-27 ENCOUNTER — Ambulatory Visit: Admitting: Orthopedic Surgery

## 2024-09-27 ENCOUNTER — Encounter: Payer: Self-pay | Admitting: Orthopedic Surgery

## 2024-09-27 DIAGNOSIS — M170 Bilateral primary osteoarthritis of knee: Secondary | ICD-10-CM | POA: Diagnosis not present

## 2024-09-27 DIAGNOSIS — M1712 Unilateral primary osteoarthritis, left knee: Secondary | ICD-10-CM

## 2024-09-27 DIAGNOSIS — M1711 Unilateral primary osteoarthritis, right knee: Secondary | ICD-10-CM

## 2024-09-27 DIAGNOSIS — F3160 Bipolar disorder, current episode mixed, unspecified: Secondary | ICD-10-CM | POA: Diagnosis not present

## 2024-09-27 NOTE — Progress Notes (Signed)
   Chief Complaint  Patient presents with   Injections    Both knees #3   Patient: Gary Grimes           Date of Birth: [birth year]           MRN: [id number] Visit Date: 09/27/2024 Requested by: Thedora Garnette HERO, MD [address] [place],  [place] [address] PCP: Thedora Garnette HERO, MD  Chief Complaint  Patient presents with   Injections    Both knees #3    Encounter Diagnoses  Name Primary?   Primary osteoarthritis of right knee Yes   Primary osteoarthritis of left knee     The patient has consented to and requested hyaluronic acid injection   MEDICATION: orthovisc  Both knees   THE knee is prepped with alcohol and ethyl chloride  The injection is performed with a 21-gauge needle, via inferolateral approach  No complications were noted  Appropriate instructions post injection were given

## 2024-09-29 ENCOUNTER — Other Ambulatory Visit: Payer: Self-pay | Admitting: Pulmonary Disease

## 2024-09-29 ENCOUNTER — Telehealth: Payer: Self-pay | Admitting: Podiatry

## 2024-09-29 NOTE — Telephone Encounter (Signed)
 Copied from CRM [id number]. Topic: Clinical - Medication Refill >> Sep 29, 2024  2:00 PM Jakia S wrote: Medication: triazolam  (HALCION ) 0.25 MG tablet  Has the patient contacted their pharmacy? Yes (Agent: If no, request that the patient contact the pharmacy for the refill. If patient does not wish to contact the pharmacy document the reason why and proceed with request.) (Agent: If yes, when and what did the pharmacy advise?)  This is the patient's preferred pharmacy:  [facility] - [place], [place] - [address] [place] [address] Phone: [phone] Fax: [phone]   Is this the correct pharmacy for this prescription? Yes If no, delete pharmacy and type the correct one.   Has the prescription been filled recently? No  Is the patient out of the medication? No,  very low  Has the patient been seen for an appointment in the last year OR does the patient have an upcoming appointment? Yes  Can we respond through MyChart? Yes  Agent: Please be advised that Rx refills may take up to 3 business days. We ask that you follow-up with your pharmacy.

## 2024-09-29 NOTE — Telephone Encounter (Signed)
**Note De-identified  [place] Obfuscation** Please advise 

## 2024-09-29 NOTE — Telephone Encounter (Signed)
 HTA Prior auth [id number] and approved [id number] Foot brace  Auth [id number]  09/22/24-12/21/24  Indexed in media

## 2024-09-29 NOTE — Telephone Encounter (Signed)
 Pt requesting refill of controlled medication. Please advise, thank you!  LOV: 08/24/24

## 2024-10-02 ENCOUNTER — Ambulatory Visit (INDEPENDENT_AMBULATORY_CARE_PROVIDER_SITE_OTHER): Admitting: Family Medicine

## 2024-10-02 ENCOUNTER — Encounter: Payer: Self-pay | Admitting: Family Medicine

## 2024-10-02 ENCOUNTER — Other Ambulatory Visit

## 2024-10-02 VITALS — BP 128/76 | HR 51 | Temp 98.7°F | Ht 71.0 in | Wt 264.2 lb

## 2024-10-02 DIAGNOSIS — Z1152 Encounter for screening for COVID-19: Secondary | ICD-10-CM | POA: Diagnosis not present

## 2024-10-02 DIAGNOSIS — B349 Viral infection, unspecified: Secondary | ICD-10-CM

## 2024-10-02 DIAGNOSIS — J4521 Mild intermittent asthma with (acute) exacerbation: Secondary | ICD-10-CM | POA: Diagnosis not present

## 2024-10-02 DIAGNOSIS — R6889 Other general symptoms and signs: Secondary | ICD-10-CM | POA: Diagnosis not present

## 2024-10-02 LAB — POC COVID19 BINAXNOW: SARS Coronavirus 2 Ag: NEGATIVE

## 2024-10-02 LAB — POCT INFLUENZA A/B
Influenza A, POC: NEGATIVE
Influenza B, POC: NEGATIVE

## 2024-10-02 MED ORDER — PREDNISONE 10 MG PO TABS: 10.0000 mg | ORAL_TABLET | Freq: Two times a day (BID) | ORAL | 0 refills | Status: AC

## 2024-10-02 MED ORDER — PROMETHAZINE-DM 6.25-15 MG/5ML PO SYRP: 5.0000 mL | ORAL_SOLUTION | Freq: Four times a day (QID) | ORAL | 0 refills | Status: DC | PRN

## 2024-10-02 NOTE — Progress Notes (Signed)
 Established Patient Office Visit   Subjective:  Patient ID: Gary Grimes, male    DOB: Dec 03, 1961  Age: 63 y.o. MRN: [id number]  Chief Complaint  Patient presents with   Cough    Productive cough x 4 days. No NV. Fever, or chills. Diarrhea last night only once. Achy body and fatigue. Pt requested to be checked for covid and flu.     Cough Associated symptoms include myalgias and wheezing. Pertinent negatives include no chills, eye redness, fever or rash.   Encounter Diagnoses  Name Primary?   Encounter for screening for COVID-19 Yes   Flu-like symptoms    Viral syndrome    Mild intermittent reactive airway disease with acute exacerbation    4-day history of malaise with nasal congestion postnasal drip and cough.  There is wheezing and myalgia.  No asthma history.  No fevers chills.  1 episode of diarrhea yesterday.  Has been using his albuterol  nebs as needed.   Review of Systems  Constitutional:  Positive for malaise/fatigue. Negative for chills and fever.  HENT:  Positive for congestion.   Eyes:  Negative for blurred vision, discharge and redness.  Respiratory:  Positive for cough and wheezing.   Cardiovascular: Negative.   Gastrointestinal:  Negative for abdominal pain.  Genitourinary: Negative.   Musculoskeletal:  Positive for myalgias.  Skin:  Negative for rash.  Neurological:  Negative for tingling, loss of consciousness and weakness.  Endo/Heme/Allergies:  Negative for polydipsia.     Current Outpatient Medications:    albuterol  (PROVENTIL ) (2.5 MG/3ML) 0.083% nebulizer solution, Take 3 mLs (2.5 mg total) by nebulization every 6 (six) hours as needed for wheezing or shortness of breath., Disp: 150 mL, Rfl: 1   apixaban  (ELIQUIS ) 5 MG TABS tablet, Take 1 tablet (5 mg total) by mouth 2 (two) times daily., Disp: 180 tablet, Rfl: 3   Ascorbic Acid  (VITAMIN C ) 1000 MG tablet, Take 1,000 mg by mouth daily., Disp: , Rfl:    aspirin  EC 81 MG tablet, Take 1 tablet (81 mg  total) by mouth daily. Swallow whole., Disp: 90 tablet, Rfl: 3   atomoxetine  (STRATTERA ) 40 MG capsule, TAKE ONE (1) CAPSULE BY MOUTH 2 TIMES DAILY, Disp: 180 capsule, Rfl: 2   atorvastatin  (LIPITOR) 10 MG tablet, Take 1 tablet (10 mg total) by mouth daily., Disp: 90 tablet, Rfl: 3   cariprazine (VRAYLAR) 3 MG capsule, Take 3 mg by mouth daily., Disp: , Rfl:    Continuous Glucose Sensor (FREESTYLE LIBRE 3 PLUS SENSOR) MISC, 1 each by Does not apply route every 14 (fourteen) days., Disp: 6 each, Rfl: 3   hydrOXYzine  (ATARAX ) 25 MG tablet, Take 25 mg by mouth at bedtime., Disp: , Rfl:    ibuprofen  (ADVIL ) 800 MG tablet, Take 1 tablet (800 mg total) by mouth every 8 (eight) hours as needed., Disp: 90 tablet, Rfl: 1   Insulin  Pen Needle (SURE COMFORT PEN NEEDLES) 32G X 4 MM MISC, Use daily to inject insulin , Disp: 100 each, Rfl: 5   insulin  regular human CONCENTRATED (HUMULIN  R U-500 KWIKPEN) 500 UNIT/ML KwikPen, Inject under skin 60-80 units daily as advised, Disp: 18 mL, Rfl: 3   lidocaine  (LIDODERM ) 5 %, Place 1 patch onto the skin daily. Remove & Discard patch within 12 hours or as directed by MD, Disp: 14 patch, Rfl: 0   loperamide  (IMODIUM  A-D) 2 MG tablet, Take 1 tablet (2 mg total) by mouth 4 (four) times daily as needed for diarrhea or loose stools., Disp:  30 tablet, Rfl: 0   methocarbamol  (ROBAXIN ) 500 MG tablet, Take 2 tablets (1,000 mg total) by mouth 4 (four) times daily., Disp: 30 tablet, Rfl: 0   olmesartan  (BENICAR ) 20 MG tablet, Take 1 tablet (20 mg total) by mouth daily., Disp: 90 tablet, Rfl: 3   Omega-3 Fatty Acids (FISH OIL ) 1000 MG CAPS, Take 2 capsules (2,000 mg total) by mouth 2 (two) times daily., Disp: , Rfl:    ondansetron  (ZOFRAN ) 4 MG tablet, Take 1 tablet (4 mg total) by mouth every 6 (six) hours., Disp: 12 tablet, Rfl: 0   ondansetron  (ZOFRAN -ODT) 4 MG disintegrating tablet, Take 1 tablet (4 mg total) by mouth every 8 (eight) hours as needed., Disp: 20 tablet, Rfl: 2    predniSONE  (DELTASONE ) 10 MG tablet, Take 1 tablet (10 mg total) by mouth 2 (two) times daily with a meal for 7 days., Disp: 14 tablet, Rfl: 0   promethazine -dextromethorphan (PROMETHAZINE -DM) 6.25-15 MG/5ML syrup, Take 5 mLs by mouth 4 (four) times daily as needed., Disp: 118 mL, Rfl: 0   tirzepatide  (MOUNJARO ) 15 MG/0.5ML Pen, Inject 15 mg into the skin once a week., Disp: 6 mL, Rfl: 3   traMADol  (ULTRAM ) 50 MG tablet, Take 1 tablet (50 mg total) by mouth every 8 (eight) hours as needed., Disp: 20 tablet, Rfl: 0   traZODone  (DESYREL ) 100 MG tablet, TAKE TWO TABLETS BY MOUTH EVERY NIGHT AT BEDTIME, Disp: 180 tablet, Rfl: 3   triazolam  (HALCION ) 0.25 MG tablet, Take 1 tablet (0.25 mg total) by mouth at bedtime as needed for sleep., Disp: 30 tablet, Rfl: 2   Objective:     BP 128/76 (BP Location: Left Arm, Patient Position: Sitting, Cuff Size: Large)   Pulse (!) 51   Temp 98.7 F (37.1 C) (Temporal)   Ht 5' 11 (1.803 m)   Wt 264 lb 3.2 oz (119.8 kg)   SpO2 97%   BMI 36.85 kg/m    Physical Exam Constitutional:      General: He is not in acute distress.    Appearance: Normal appearance. He is not ill-appearing, toxic-appearing or diaphoretic.  HENT:     Head: Normocephalic and atraumatic.     Right Ear: External ear normal.     Left Ear: External ear normal.  Eyes:     General: No scleral icterus.       Right eye: No discharge.        Left eye: No discharge.     Extraocular Movements: Extraocular movements intact.     Conjunctiva/sclera: Conjunctivae normal.     Pupils: Pupils are equal, round, and reactive to light.  Cardiovascular:     Rate and Rhythm: Normal rate and regular rhythm.  Pulmonary:     Effort: Pulmonary effort is normal. No respiratory distress.     Breath sounds: Wheezing present. No rhonchi or rales.  Abdominal:     General: Bowel sounds are normal.     Tenderness: There is no abdominal tenderness. There is no guarding.  Musculoskeletal:     Cervical back:  No rigidity or tenderness.  Skin:    General: Skin is warm and dry.  Neurological:     Mental Status: He is alert and oriented to person, place, and time.  Psychiatric:        Mood and Affect: Mood normal.        Behavior: Behavior normal.      No results found for any visits on 10/02/24.    The ASCVD Risk  score (Arnett DK, et al., 2019) failed to calculate for the following reasons:   The valid total cholesterol range is 130 to 320 mg/dL    Assessment & Plan:   Encounter for screening for COVID-19 -     POC COVID-19 BinaxNow  Flu-like symptoms -     POCT Influenza A/B  Viral syndrome -     Promethazine -DM; Take 5 mLs by mouth 4 (four) times daily as needed.  Dispense: 118 mL; Refill: 0  Mild intermittent reactive airway disease with acute exacerbation -     predniSONE ; Take 1 tablet (10 mg total) by mouth 2 (two) times daily with a meal for 7 days.  Dispense: 14 tablet; Refill: 0    Return Use nebs as needed for cough and/or wheezing.  Follow-up in 1 week if not improving.  COVID and flu were both negative.   Elsie Sim Lent, MD

## 2024-10-03 ENCOUNTER — Encounter: Admitting: Physical Medicine and Rehabilitation

## 2024-10-04 ENCOUNTER — Ambulatory Visit: Admitting: Nurse Practitioner

## 2024-10-04 ENCOUNTER — Ambulatory Visit

## 2024-10-04 ENCOUNTER — Encounter: Payer: Self-pay | Admitting: Nurse Practitioner

## 2024-10-04 ENCOUNTER — Ambulatory Visit: Payer: Self-pay | Admitting: Family Medicine

## 2024-10-04 VITALS — BP 118/70 | HR 81 | Temp 96.7°F | Ht 71.0 in | Wt 265.2 lb

## 2024-10-04 DIAGNOSIS — J209 Acute bronchitis, unspecified: Secondary | ICD-10-CM | POA: Diagnosis not present

## 2024-10-04 MED ORDER — METHYLPREDNISOLONE SODIUM SUCC 40 MG IJ SOLR: 20.0000 mg | Freq: Once | INTRAMUSCULAR | Status: DC

## 2024-10-04 MED ORDER — DOXYCYCLINE HYCLATE 100 MG PO TABS: 100.0000 mg | ORAL_TABLET | Freq: Two times a day (BID) | ORAL | 0 refills | Status: AC

## 2024-10-04 MED ORDER — TRIAZOLAM 0.25 MG PO TABS: 0.2500 mg | ORAL_TABLET | Freq: Every evening | ORAL | 2 refills | Status: DC | PRN

## 2024-10-04 MED ORDER — IPRATROPIUM-ALBUTEROL 0.5-2.5 (3) MG/3ML IN SOLN
3.0000 mL | Freq: Once | RESPIRATORY_TRACT | Status: AC
  Administered 2024-10-04: 3 mL via RESPIRATORY_TRACT

## 2024-10-04 MED ORDER — METHYLPREDNISOLONE SODIUM SUCC 40 MG IJ SOLR
40.0000 mg | Freq: Once | INTRAMUSCULAR | Status: AC
  Administered 2024-10-04: 40 mg via INTRAMUSCULAR

## 2024-10-04 NOTE — Patient Instructions (Signed)
 Use mucinex  Dm or robitussin in the daytime and promethazine  Dm at bedtime Continue use of albuterol  and oral prednisone  Start doxycycline  Maintain adequate oral hydration

## 2024-10-04 NOTE — Telephone Encounter (Signed)
 Noted. Dm/cma

## 2024-10-04 NOTE — Progress Notes (Signed)
 Acute Office Visit  Subjective:    Patient ID: Gary Grimes, male    DOB: 1961-10-17, 63 y.o.   MRN: [id number]  Chief Complaint  Patient presents with   Cough    Pt states he came in Monday seen Dr. Berneta and his cough is getting no better, states the cough is now bringing up green phelym. Pt states the cough been going on since last Thursday. Pt also states he has been having a headache, but no sneezing.     Cough This is a new problem. The current episode started 1 to 4 weeks ago. The problem has been unchanged. The problem occurs constantly. The cough is Productive of sputum and productive of purulent sputum. Associated symptoms include chest pain, chills, rhinorrhea, shortness of breath and wheezing. Pertinent negatives include no ear congestion, ear pain, fever, headaches, heartburn, hemoptysis, myalgias, nasal congestion, postnasal drip, rash, sore throat, sweats or weight loss. The symptoms are aggravated by lying down. He has tried a beta-agonist inhaler and prescription cough suppressant for the symptoms. The treatment provided mild relief. His past medical history is significant for bronchitis. There is no history of asthma, bronchiectasis, COPD, emphysema, environmental allergies or pneumonia.  Negative COVID and Flu test 2days ago He started oral prednisone  2days ago, takes promethazine  DIABETES at hs only due to sedating side effects, and albuterol  nebulizer every 6hrs. He has minimal relief. Last albuterol  dose was this AM. No tobacco use or vaping  Outpatient Medications Prior to Visit  Medication Sig   albuterol  (PROVENTIL ) (2.5 MG/3ML) 0.083% nebulizer solution Take 3 mLs (2.5 mg total) by nebulization every 6 (six) hours as needed for wheezing or shortness of breath.   apixaban  (ELIQUIS ) 5 MG TABS tablet Take 1 tablet (5 mg total) by mouth 2 (two) times daily.   Ascorbic Acid  (VITAMIN C ) 1000 MG tablet Take 1,000 mg by mouth daily.   aspirin  EC 81 MG tablet Take 1  tablet (81 mg total) by mouth daily. Swallow whole.   atomoxetine  (STRATTERA ) 40 MG capsule TAKE ONE (1) CAPSULE BY MOUTH 2 TIMES DAILY   atorvastatin  (LIPITOR) 10 MG tablet Take 1 tablet (10 mg total) by mouth daily.   cariprazine (VRAYLAR) 3 MG capsule Take 3 mg by mouth daily.   Continuous Glucose Sensor (FREESTYLE LIBRE 3 PLUS SENSOR) MISC 1 each by Does not apply route every 14 (fourteen) days.   hydrOXYzine  (ATARAX ) 25 MG tablet Take 25 mg by mouth at bedtime.   ibuprofen  (ADVIL ) 800 MG tablet Take 1 tablet (800 mg total) by mouth every 8 (eight) hours as needed.   Insulin  Pen Needle (SURE COMFORT PEN NEEDLES) 32G X 4 MM MISC Use daily to inject insulin    insulin  regular human CONCENTRATED (HUMULIN  R U-500 KWIKPEN) 500 UNIT/ML KwikPen Inject under skin 60-80 units daily as advised   lidocaine  (LIDODERM ) 5 % Place 1 patch onto the skin daily. Remove & Discard patch within 12 hours or as directed by MD   loperamide  (IMODIUM  A-D) 2 MG tablet Take 1 tablet (2 mg total) by mouth 4 (four) times daily as needed for diarrhea or loose stools.   methocarbamol  (ROBAXIN ) 500 MG tablet Take 2 tablets (1,000 mg total) by mouth 4 (four) times daily.   olmesartan  (BENICAR ) 20 MG tablet Take 1 tablet (20 mg total) by mouth daily.   Omega-3 Fatty Acids (FISH OIL ) 1000 MG CAPS Take 2 capsules (2,000 mg total) by mouth 2 (two) times daily.   ondansetron  (ZOFRAN ) 4  MG tablet Take 1 tablet (4 mg total) by mouth every 6 (six) hours.   ondansetron  (ZOFRAN -ODT) 4 MG disintegrating tablet Take 1 tablet (4 mg total) by mouth every 8 (eight) hours as needed.   predniSONE  (DELTASONE ) 10 MG tablet Take 1 tablet (10 mg total) by mouth 2 (two) times daily with a meal for 7 days.   promethazine -dextromethorphan (PROMETHAZINE -DM) 6.25-15 MG/5ML syrup Take 5 mLs by mouth 4 (four) times daily as needed.   tirzepatide  (MOUNJARO ) 15 MG/0.5ML Pen Inject 15 mg into the skin once a week.   traMADol  (ULTRAM ) 50 MG tablet Take 1  tablet (50 mg total) by mouth every 8 (eight) hours as needed.   traZODone  (DESYREL ) 100 MG tablet TAKE TWO TABLETS BY MOUTH EVERY NIGHT AT BEDTIME   triazolam  (HALCION ) 0.25 MG tablet Take 1 tablet (0.25 mg total) by mouth at bedtime as needed for sleep.   No facility-administered medications prior to visit.   Reviewed past medical and social history.  Review of Systems  Constitutional:  Positive for chills. Negative for fever and weight loss.  HENT:  Positive for rhinorrhea. Negative for ear pain, postnasal drip and sore throat.   Respiratory:  Positive for cough, shortness of breath and wheezing. Negative for hemoptysis.   Cardiovascular:  Positive for chest pain.  Gastrointestinal:  Negative for heartburn.  Musculoskeletal:  Negative for myalgias.  Skin:  Negative for rash.  Allergic/Immunologic: Negative for environmental allergies.  Neurological:  Negative for headaches.   Per HPI     Objective:    Physical Exam Vitals and nursing note reviewed.  Cardiovascular:     Rate and Rhythm: Normal rate and regular rhythm.     Pulses: Normal pulses.     Heart sounds: Normal heart sounds.  Pulmonary:     Effort: Pulmonary effort is normal.     Breath sounds: Rhonchi present.     Comments: Equal and clear lung sounds post nebulizer treatment Neurological:     Mental Status: He is alert and oriented to person, place, and time.    BP 118/70 (BP Location: Right Arm, Patient Position: Sitting, Cuff Size: Large)   Pulse 81   Temp (!) 96.7 F (35.9 C) (Temporal)   Ht 5' 11 (1.803 m)   Wt 265 lb 3.2 oz (120.3 kg)   SpO2 96%   BMI 36.99 kg/m    No results found for any visits on 10/04/24.     Assessment & Plan:   Problem List Items Addressed This Visit   None Visit Diagnoses       Acute bronchitis, unspecified organism    -  Primary   Relevant Medications   ipratropium-albuterol  (DUONEB) 0.5-2.5 (3) MG/3ML nebulizer solution 3 mL (Completed)   methylPREDNISolone  sodium  succinate (SOLU-MEDROL ) 40 mg/mL injection 40 mg (Completed)   doxycycline  (VIBRA -TABS) 100 MG tablet      Meds ordered this encounter  Medications   ipratropium-albuterol  (DUONEB) 0.5-2.5 (3) MG/3ML nebulizer solution 3 mL   DISCONTD: methylPREDNISolone  sodium succinate (SOLU-MEDROL ) 40 mg/mL injection 20 mg   methylPREDNISolone  sodium succinate (SOLU-MEDROL ) 40 mg/mL injection 40 mg   doxycycline  (VIBRA -TABS) 100 MG tablet    Sig: Take 1 tablet (100 mg total) by mouth 2 (two) times daily for 7 days.    Dispense:  14 tablet    Refill:  0    Supervising Provider:   BERNETA ELSIE SAYRE [[id number]]   Return if symptoms worsen or fail to improve.  Roselie Mood, NP

## 2024-10-04 NOTE — Telephone Encounter (Signed)
 FYI Only or Action Required?: FYI only for provider.  Patient was last seen in primary care on 10/02/2024 by Berneta Elsie Sayre, MD.  Called Nurse Triage reporting Cough.  Symptoms began several days ago.  Interventions attempted: Prescription medications: promethazine -dextromethorphan (PROMETHAZINE -DM) 6.25-15 MG/5ML syrup, predniSONE  (DELTASONE ) 10 MG tablet .  Symptoms are: gradually worsening.  Triage Disposition: See HCP Within 4 Hours (Or PCP Triage)  Patient/caregiver understands and will follow disposition?: Yes                Copied from CRM #[id number]. Topic: Clinical - Red Word Triage >> Oct 04, 2024 10:38 AM Maisie BROCKS wrote: Red Word that prompted transfer to Nurse Triage: sxs worsening of respiratory infection, coughing up green film Reason for Disposition  [1] MILD difficulty breathing (e.g., minimal/no SOB at rest, SOB with walking, pulse < 100) AND [2] still present when not coughing  Answer Assessment - Initial Assessment Questions Pt was seen in office on 10/6 and now wants to be seen again and pt thinks he needs an antibiotic.  Green phlegm  Cough Pt states he has a history of getting pneumonia Pt wants an antibiotic Onset: Thurs Not sure about temp hasn't checked Does have body aches Fatigue Difficulty breathing  Protocols used: Cough - Acute Productive-A-AH

## 2024-10-05 ENCOUNTER — Encounter: Admitting: Physical Medicine and Rehabilitation

## 2024-10-09 ENCOUNTER — Encounter: Admitting: Physical Medicine and Rehabilitation

## 2024-10-10 ENCOUNTER — Ambulatory Visit (INDEPENDENT_AMBULATORY_CARE_PROVIDER_SITE_OTHER)

## 2024-10-10 ENCOUNTER — Ambulatory Visit: Payer: Self-pay

## 2024-10-10 ENCOUNTER — Ambulatory Visit

## 2024-10-10 ENCOUNTER — Other Ambulatory Visit: Payer: Self-pay | Admitting: Family Medicine

## 2024-10-10 DIAGNOSIS — Z111 Encounter for screening for respiratory tuberculosis: Secondary | ICD-10-CM | POA: Diagnosis not present

## 2024-10-10 DIAGNOSIS — B349 Viral infection, unspecified: Secondary | ICD-10-CM

## 2024-10-10 DIAGNOSIS — E1140 Type 2 diabetes mellitus with diabetic neuropathy, unspecified: Secondary | ICD-10-CM

## 2024-10-10 MED ORDER — TIRZEPATIDE 15 MG/0.5ML ~~LOC~~ SOAJ: 15.0000 mg | SUBCUTANEOUS | 3 refills | Status: AC

## 2024-10-10 MED ORDER — PROMETHAZINE-DM 6.25-15 MG/5ML PO SYRP: 5.0000 mL | ORAL_SOLUTION | Freq: Four times a day (QID) | ORAL | 0 refills | Status: DC | PRN

## 2024-10-10 NOTE — Progress Notes (Signed)
 Patient came in for a nurse visit to have a TB skin test done for work. Tuberculin skin test applied to left forearm by Laymon Gladis Sharps, CMA. Patient instructed to come back on Thursday to have test read and appointment was made.

## 2024-10-10 NOTE — Telephone Encounter (Signed)
 FYI Only or Action Required?: Action required by provider: medication refill request.  Patient was last seen in primary care on 10/04/2024 by Nche, Roselie Rockford, NP.  Called Nurse Triage reporting Cough.  Symptoms began several weeks ago.  Interventions attempted: Prescription medications: doxycycline ; prednisone .  Symptoms are: unchanged.  Triage Disposition: See HCP Within 4 Hours (Or PCP Triage)  Patient/caregiver understands and will follow disposition?: Yes  Copied from CRM #[id number]. Topic: Clinical - Red Word Triage >> Oct 10, 2024 11:45 AM Frederich PARAS wrote: [facility] that prompted transfer to Nurse Triage: productive cough Pt called to do med refill for cough medicine. Pt complains about cough. Pt was coughing on the line as well Reason for Disposition  Wheezing is present  Answer Assessment - Initial Assessment Questions Pt calls and states he's had productive cough with green sputum for 2.5 weeks. Evaluated and placed on abx and prednisone . Finished both today. States he does not feel better and pcp recommended Cxray. RN recommended to UC for sxs management and Cxray. Pt agreeable. Pt also requesting refill on promethazine  for cough.   1. ONSET: When did the cough begin?      2.5 weeks ago  2. SEVERITY: How bad is the cough today?      constant 3. SPUTUM: Describe the color of your sputum (e.g., none, dry cough; clear, white, yellow, green)     green 4. HEMOPTYSIS: Are you coughing up any blood? If Yes, ask: How much? (e.g., flecks, streaks, tablespoons, etc.)     denies 5. DIFFICULTY BREATHING: Are you having difficulty breathing? If Yes, ask: How bad is it? (e.g., mild, moderate, severe)      Has to catch breath after coughing fits 6. FEVER: Do you have a fever? If Yes, ask: What is your temperature, how was it measured, and when did it start?     Denies; but states he had chills last night 7. CARDIAC HISTORY: Do you have any history of heart disease?  (e.g., heart attack, congestive heart failure)      Afib; ablation done this year 8. LUNG HISTORY: Do you have any history of lung disease?  (e.g., pulmonary embolus, asthma, emphysema)     Recurrent bronchitis 9. PE RISK FACTORS: Do you have a history of blood clots? (or: recent major surgery, recent prolonged travel, bedridden)     denies 10. OTHER SYMPTOMS: Do you have any other symptoms? (e.g., runny nose, wheezing, chest pain)       Intermittent wheezing; higher in respiratory  12. TRAVEL: Have you traveled out of the country in the last month? (e.g., travel history, exposures)       denies  Protocols used: Cough - Acute Productive-A-AH

## 2024-10-10 NOTE — Telephone Encounter (Signed)
 Copied from CRM [id number]. Topic: Clinical - Medication Refill >> Oct 10, 2024 11:42 AM Frederich PARAS wrote: Medication: promethazine -dextromethorphan (PROMETHAZINE -DM) 6.25-15 MG/5ML syrup tirzepatide  (MOUNJARO ) 15 MG/0.5ML Pen   He adv the pharmacy told him to call us    This is the patient's preferred pharmacy:  [facility] - [place], [place] - [address] [place] [address] Phone: [phone] Fax: [phone]   Is this the correct pharmacy for this prescription? Yes If no, delete pharmacy and type the correct one.   Has the prescription been filled recently? Yes  Is the patient out of the medication? Yes  Has the patient been seen for an appointment in the last year OR does the patient have an upcoming appointment? Yes  Can we respond through MyChart? Yes  Agent: Please be advised that Rx refills may take up to 3 business days. We ask that you follow-up with your pharmacy.

## 2024-10-11 ENCOUNTER — Ambulatory Visit

## 2024-10-11 DIAGNOSIS — F3160 Bipolar disorder, current episode mixed, unspecified: Secondary | ICD-10-CM | POA: Diagnosis not present

## 2024-10-11 NOTE — Telephone Encounter (Signed)
 Rx sent in another message by provider. Dm/cma

## 2024-10-11 NOTE — Progress Notes (Signed)
 Orthotics   Patient was present and evaluated for Custom molded foot orthotics. Patient will benefit from CFO's to provide total contact to BIL MLA's helping to balance and distribute body weight more evenly across BIL feet helping to reduce plantar pressure and pain. Orthotic will also encourage FF / RF alignment  Patient was scanned today and will return for fitting upon receipt  HTA covers 80% auth valid through 12.25.25  Lolita Schultze Cped, CFo, CFm

## 2024-10-12 ENCOUNTER — Ambulatory Visit: Admitting: Internal Medicine

## 2024-10-12 ENCOUNTER — Ambulatory Visit

## 2024-10-12 ENCOUNTER — Encounter: Admitting: Physical Medicine and Rehabilitation

## 2024-10-12 LAB — TB SKIN TEST
Induration: 0 mm
TB Skin Test: NEGATIVE

## 2024-10-12 NOTE — Progress Notes (Signed)
 PPD Reading Note  PPD read and results entered in EpicCare.  Result: 0 mm induration.  Interpretation: Negative  If test not read within 48-72 hours of initial placement, patient advised to repeat in other arm 1-3 weeks after this test.  Allergic reaction: no

## 2024-10-18 ENCOUNTER — Encounter: Payer: Self-pay | Admitting: Family Medicine

## 2024-10-18 ENCOUNTER — Ambulatory Visit (INDEPENDENT_AMBULATORY_CARE_PROVIDER_SITE_OTHER): Admitting: Family Medicine

## 2024-10-18 VITALS — BP 124/76 | HR 86 | Temp 97.2°F | Ht 71.0 in | Wt 267.8 lb

## 2024-10-18 DIAGNOSIS — Z6841 Body Mass Index (BMI) 40.0 and over, adult: Secondary | ICD-10-CM

## 2024-10-18 DIAGNOSIS — Z794 Long term (current) use of insulin: Secondary | ICD-10-CM | POA: Diagnosis not present

## 2024-10-18 DIAGNOSIS — Z23 Encounter for immunization: Secondary | ICD-10-CM | POA: Diagnosis not present

## 2024-10-18 DIAGNOSIS — F3160 Bipolar disorder, current episode mixed, unspecified: Secondary | ICD-10-CM | POA: Diagnosis not present

## 2024-10-18 DIAGNOSIS — E1140 Type 2 diabetes mellitus with diabetic neuropathy, unspecified: Secondary | ICD-10-CM

## 2024-10-18 DIAGNOSIS — I10 Essential (primary) hypertension: Secondary | ICD-10-CM

## 2024-10-18 DIAGNOSIS — Z7985 Long-term (current) use of injectable non-insulin antidiabetic drugs: Secondary | ICD-10-CM

## 2024-10-18 DIAGNOSIS — J40 Bronchitis, not specified as acute or chronic: Secondary | ICD-10-CM | POA: Diagnosis not present

## 2024-10-18 MED ORDER — PROMETHAZINE-DM 6.25-15 MG/5ML PO SYRP: 5.0000 mL | ORAL_SOLUTION | Freq: Four times a day (QID) | ORAL | 0 refills | Status: DC | PRN

## 2024-10-18 NOTE — Assessment & Plan Note (Signed)
 Blood pressure is back at goal. Continue olmesartan  20 mg daily.

## 2024-10-18 NOTE — Assessment & Plan Note (Signed)
 Improved. I will renew some cough syrup to help as this finishes clearing.
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
## 2024-10-18 NOTE — Assessment & Plan Note (Signed)
 A1c was up mildly at his last visit with Dr. Trixie. Continue  U-500 and Tresiba  as managed by Dr. Trixie and tirzepatide  (Mounjaro ) to 15 mg weekly.

## 2024-10-18 NOTE — Assessment & Plan Note (Signed)
 Maximum weight: 325 lbs (04/2023) Current weight: 267 lbs Weight change since last visit: - 3 lbs Total weight loss: - 58 lbs (17.8 %)  Gary Grimes has obesity with multiple serious co-morbidities. He had some recent weight regain, but this is stable currently. I will continue tirzepatide  (Mounjaro ) 15 mg weekly for diabetes management and weight loss. I urged him to return to his former exercise routine.

## 2024-10-22 DIAGNOSIS — G4733 Obstructive sleep apnea (adult) (pediatric): Secondary | ICD-10-CM | POA: Diagnosis not present

## 2024-10-23 ENCOUNTER — Encounter: Payer: Self-pay | Admitting: Emergency Medicine

## 2024-10-23 ENCOUNTER — Ambulatory Visit

## 2024-10-23 ENCOUNTER — Ambulatory Visit (INDEPENDENT_AMBULATORY_CARE_PROVIDER_SITE_OTHER): Admitting: Emergency Medicine

## 2024-10-23 VITALS — BP 132/74 | HR 91 | Temp 99.1°F | Resp 18 | Ht 71.0 in | Wt 262.0 lb

## 2024-10-23 DIAGNOSIS — H6993 Unspecified Eustachian tube disorder, bilateral: Secondary | ICD-10-CM

## 2024-10-23 DIAGNOSIS — J329 Chronic sinusitis, unspecified: Secondary | ICD-10-CM

## 2024-10-23 DIAGNOSIS — J040 Acute laryngitis: Secondary | ICD-10-CM | POA: Diagnosis not present

## 2024-10-23 DIAGNOSIS — R0602 Shortness of breath: Secondary | ICD-10-CM | POA: Diagnosis not present

## 2024-10-23 DIAGNOSIS — J40 Bronchitis, not specified as acute or chronic: Secondary | ICD-10-CM | POA: Diagnosis not present

## 2024-10-23 DIAGNOSIS — J302 Other seasonal allergic rhinitis: Secondary | ICD-10-CM | POA: Diagnosis not present

## 2024-10-23 DIAGNOSIS — R058 Other specified cough: Secondary | ICD-10-CM

## 2024-10-23 MED ORDER — ALBUTEROL SULFATE HFA 108 (90 BASE) MCG/ACT IN AERS: 2.0000 | INHALATION_SPRAY | Freq: Four times a day (QID) | RESPIRATORY_TRACT | 1 refills | Status: AC | PRN

## 2024-10-23 MED ORDER — FLUTICASONE PROPIONATE 50 MCG/ACT NA SUSP: 1.0000 | Freq: Every day | NASAL | 0 refills | Status: DC

## 2024-10-23 MED ORDER — LEVOCETIRIZINE DIHYDROCHLORIDE 5 MG PO TABS: 5.0000 mg | ORAL_TABLET | Freq: Every evening | ORAL | 0 refills | Status: AC

## 2024-10-23 MED ORDER — PROMETHAZINE-DM 6.25-15 MG/5ML PO SYRP: 5.0000 mL | ORAL_SOLUTION | Freq: Four times a day (QID) | ORAL | 0 refills | Status: DC | PRN

## 2024-10-23 MED ORDER — PREDNISONE 20 MG PO TABS: ORAL_TABLET | ORAL | 0 refills | Status: AC

## 2024-10-23 MED ORDER — AMOXICILLIN-POT CLAVULANATE 875-125 MG PO TABS: 1.0000 | ORAL_TABLET | Freq: Two times a day (BID) | ORAL | 0 refills | Status: AC

## 2024-10-23 NOTE — Progress Notes (Signed)
 Assessment & Plan:   Assessment & Plan Sinobronchitis  Orders:   predniSONE  (DELTASONE ) 20 MG tablet; Take 1 tablet (20 mg total) by mouth 2 (two) times daily with a meal for 4 days, THEN 1 tablet (20 mg total) daily for 4 days.   promethazine -dextromethorphan (PROMETHAZINE -DM) 6.25-15 MG/5ML syrup; Take 5 mLs by mouth 4 (four) times daily as needed for cough.   amoxicillin -clavulanate (AUGMENTIN ) 875-125 MG tablet; Take 1 tablet by mouth 2 (two) times daily for 10 days.   albuterol  (VENTOLIN  HFA) 108 (90 Base) MCG/ACT inhaler; Inhale 2 puffs into the lungs every 6 (six) hours as needed for wheezing or shortness of breath.  Cough present for greater than 3 weeks  Orders:   DG Chest 2 View; Future  Seasonal allergies  Orders:   levocetirizine (XYZAL) 5 MG tablet; Take 1 tablet (5 mg total) by mouth every evening.   fluticasone  (FLONASE ) 50 MCG/ACT nasal spray; Place 1 spray into both nostrils daily.  Acute laryngitis     Bronchitis     ETD (Eustachian tube dysfunction), bilateral  Orders:   predniSONE  (DELTASONE ) 20 MG tablet; Take 1 tablet (20 mg total) by mouth 2 (two) times daily with a meal for 4 days, THEN 1 tablet (20 mg total) daily for 4 days.   fluticasone  (FLONASE ) 50 MCG/ACT nasal spray; Place 1 spray into both nostrils daily.    Chest x-ray clear on my read. Pending rad read, will call if differs.   Patient Instructions  The lozenges I recommended that you try are Fisherman's Friend, which are in a box on a shelf next to the other cough drops at the pharmacy  you have been prescribed an antibiotic for the infection and a steroid to try to calm down airway inflammation as well as pressure on your ears. I refilled your cough syrup, and you can take 1 teaspoon during the day or 2 teaspoons at night. I sent in an inhaler that you can use when you have wheezing or shortness of breath I recommend you take an allergy tablet daily and use a nasal steroid spray-each  of these have been prescribed but they are also available over-the-counter We will call you if your chest x-ray shows any cause for concern I recommend a reevaluation in person if you are not improving over the next week, and I recommend emergency department evaluation should you have any sudden symptom worsening to include increased difficulty breathing    Gary Grimes, MSPAS, Gary Grimes   Subjective:  HPI: Gary Grimes is a 63 y.o. male with history of recurrent bronchitis, insulin -dependent diabetes bronchitis with acute wheezing presenting on 10/23/2024 for Nasal Congestion (Congestion, cough, SOB x 4 weeks. Feels terrible- no improvement over the past 4 weeks. ) 10/6- Neg COVID and flu, Pred low dose burst rx in addition to promethazine -DM syrup 10/8- Re-evaluated, dx bronchitis, duo-neb and 40mg  depomedrol in clinic, rx doxy BID x 7 days 10/14- Refill promethazine -DM syrup 10/22- PCP eval, lingering cough but overall felt like he was on the mend Since that visit, feeling worse again with increasing sinus pressure/congestion, drainage, ears full, voice loss, and increasing cough with intermittent wheezing (worse at night).  He has not been running fevers.  He reports blood sugar has been running in normal range for him He requests rx allergy medication, as what he has tried OTC has been insufficient.     ROS: Negative unless specifically indicated above in HPI.   Relevant past medical history reviewed and  updated as indicated.   Allergies and medications reviewed and updated.   Current Outpatient Medications:    albuterol  (PROVENTIL ) (2.5 MG/3ML) 0.083% nebulizer solution, Take 3 mLs (2.5 mg total) by nebulization every 6 (six) hours as needed for wheezing or shortness of breath., Disp: 150 mL, Rfl: 1   apixaban  (ELIQUIS ) 5 MG TABS tablet, Take 1 tablet (5 mg total) by mouth 2 (two) times daily., Disp: 180 tablet, Rfl: 3   Ascorbic Acid  (VITAMIN C ) 1000 MG tablet, Take 1,000 mg  by mouth daily., Disp: , Rfl:    aspirin  EC 81 MG tablet, Take 1 tablet (81 mg total) by mouth daily. Swallow whole., Disp: 90 tablet, Rfl: 3   atomoxetine  (STRATTERA ) 40 MG capsule, TAKE ONE (1) CAPSULE BY MOUTH 2 TIMES DAILY, Disp: 180 capsule, Rfl: 2   atorvastatin  (LIPITOR) 10 MG tablet, Take 1 tablet (10 mg total) by mouth daily., Disp: 90 tablet, Rfl: 3   cariprazine (VRAYLAR) 3 MG capsule, Take 3 mg by mouth daily., Disp: , Rfl:    Continuous Glucose Sensor (FREESTYLE LIBRE 3 PLUS SENSOR) MISC, 1 each by Does not apply route every 14 (fourteen) days., Disp: 6 each, Rfl: 3   hydrOXYzine  (ATARAX ) 25 MG tablet, Take 25 mg by mouth at bedtime., Disp: , Rfl:    ibuprofen  (ADVIL ) 800 MG tablet, Take 1 tablet (800 mg total) by mouth every 8 (eight) hours as needed., Disp: 90 tablet, Rfl: 1   Insulin  Pen Needle (SURE COMFORT PEN NEEDLES) 32G X 4 MM MISC, Use daily to inject insulin , Disp: 100 each, Rfl: 5   insulin  regular human CONCENTRATED (HUMULIN  R U-500 KWIKPEN) 500 UNIT/ML KwikPen, Inject under skin 60-80 units daily as advised, Disp: 18 mL, Rfl: 3   lidocaine  (LIDODERM ) 5 %, Place 1 patch onto the skin daily. Remove & Discard patch within 12 hours or as directed by MD, Disp: 14 patch, Rfl: 0   loperamide  (IMODIUM  A-D) 2 MG tablet, Take 1 tablet (2 mg total) by mouth 4 (four) times daily as needed for diarrhea or loose stools., Disp: 30 tablet, Rfl: 0   olmesartan  (BENICAR ) 20 MG tablet, Take 1 tablet (20 mg total) by mouth daily., Disp: 90 tablet, Rfl: 3   Omega-3 Fatty Acids (FISH OIL ) 1000 MG CAPS, Take 2 capsules (2,000 mg total) by mouth 2 (two) times daily., Disp: , Rfl:    ondansetron  (ZOFRAN -ODT) 4 MG disintegrating tablet, Take 1 tablet (4 mg total) by mouth every 8 (eight) hours as needed., Disp: 20 tablet, Rfl: 2   promethazine -dextromethorphan (PROMETHAZINE -DM) 6.25-15 MG/5ML syrup, Take 5 mLs by mouth 4 (four) times daily as needed., Disp: 118 mL, Rfl: 0   tirzepatide  (MOUNJARO ) 15  MG/0.5ML Pen, Inject 15 mg into the skin once a week., Disp: 6 mL, Rfl: 3   traMADol  (ULTRAM ) 50 MG tablet, Take 1 tablet (50 mg total) by mouth every 8 (eight) hours as needed., Disp: 20 tablet, Rfl: 0   traZODone  (DESYREL ) 100 MG tablet, TAKE TWO TABLETS BY MOUTH EVERY NIGHT AT BEDTIME, Disp: 180 tablet, Rfl: 3   triazolam  (HALCION ) 0.25 MG tablet, Take 1 tablet (0.25 mg total) by mouth at bedtime as needed for sleep., Disp: 30 tablet, Rfl: 2   methocarbamol  (ROBAXIN ) 500 MG tablet, Take 2 tablets (1,000 mg total) by mouth 4 (four) times daily. (Patient not taking: Reported on 10/23/2024), Disp: 30 tablet, Rfl: 0   ondansetron  (ZOFRAN ) 4 MG tablet, Take 1 tablet (4 mg total) by mouth every 6 (  six) hours. (Patient not taking: Reported on 10/23/2024), Disp: 12 tablet, Rfl: 0  Allergies  Allergen Reactions   Morphine  Other (See Comments)    PT BECAME DELIRIOUS        Objective:   Vitals:   10/23/24 0957  BP: 132/74  Pulse: 91  Temp: 99.1 F (37.3 C)  Resp: 18  Height: 5' 11 (1.803 m)  Weight: 262 lb (118.8 kg)  SpO2: 96%  BMI (Calculated): 36.56      Gen: appears fatigued, alert, INAD Ears: Right canal normal. Right TM dull, retracted, serous middle ear fluid. Left canal normal. Left TM dull, retracted, serous middle ear fluid.  Nose: mucosal congestion Mouth: Oral mucosa MMM. Throat: red no ulcers or exudate.  Neck: supple no LAD Heart RRR Lungs: Respiratory effort: normal, frequent deep harsh cough. Lungs with clear air movement, but unable to perform deep breaths for full auscultation due to cough triggering.   Skin: Warm and dry without acute rash to exposed areas.

## 2024-10-23 NOTE — Patient Instructions (Signed)
 The lozenges I recommended that you try are Fisherman's Friend, which are in a box on a shelf next to the other cough drops at the pharmacy  you have been prescribed an antibiotic for the infection and a steroid to try to calm down airway inflammation as well as pressure on your ears. I refilled your cough syrup, and you can take 1 teaspoon during the day or 2 teaspoons at night. I sent in an inhaler that you can use when you have wheezing or shortness of breath I recommend you take an allergy tablet daily and use a nasal steroid spray-each of these have been prescribed but they are also available over-the-counter We will call you if your chest x-ray shows any cause for concern I recommend a reevaluation in person if you are not improving over the next week, and I recommend emergency department evaluation should you have any sudden symptom worsening to include increased difficulty breathing

## 2024-10-23 NOTE — Assessment & Plan Note (Signed)
 Gary Grimes

## 2024-10-24 ENCOUNTER — Ambulatory Visit: Payer: Self-pay

## 2024-10-24 ENCOUNTER — Emergency Department (HOSPITAL_COMMUNITY)
Admission: EM | Admit: 2024-10-24 | Discharge: 2024-10-24 | Disposition: A | Discharge status: Home / Self Care | Attending: Emergency Medicine | Admitting: Emergency Medicine

## 2024-10-24 ENCOUNTER — Emergency Department (HOSPITAL_COMMUNITY)

## 2024-10-24 DIAGNOSIS — R0789 Other chest pain: Secondary | ICD-10-CM | POA: Diagnosis not present

## 2024-10-24 DIAGNOSIS — R059 Cough, unspecified: Secondary | ICD-10-CM | POA: Insufficient documentation

## 2024-10-24 DIAGNOSIS — N281 Cyst of kidney, acquired: Secondary | ICD-10-CM | POA: Diagnosis not present

## 2024-10-24 DIAGNOSIS — R000 Tachycardia, unspecified: Secondary | ICD-10-CM | POA: Insufficient documentation

## 2024-10-24 DIAGNOSIS — R0602 Shortness of breath: Secondary | ICD-10-CM | POA: Diagnosis present

## 2024-10-24 DIAGNOSIS — R739 Hyperglycemia, unspecified: Secondary | ICD-10-CM | POA: Diagnosis not present

## 2024-10-24 DIAGNOSIS — Z7901 Long term (current) use of anticoagulants: Secondary | ICD-10-CM | POA: Insufficient documentation

## 2024-10-24 DIAGNOSIS — Z794 Long term (current) use of insulin: Secondary | ICD-10-CM | POA: Insufficient documentation

## 2024-10-24 DIAGNOSIS — E1165 Type 2 diabetes mellitus with hyperglycemia: Secondary | ICD-10-CM | POA: Diagnosis not present

## 2024-10-24 LAB — CBC WITH DIFFERENTIAL/PLATELET
Abs Immature Granulocytes: 0.05 K/uL (ref 0.00–0.07)
Basophils Absolute: 0 K/uL (ref 0.0–0.1)
Basophils Relative: 0 %
Eosinophils Absolute: 0 K/uL (ref 0.0–0.5)
Eosinophils Relative: 0 %
HCT: 38.1 % — ABNORMAL LOW (ref 39.0–52.0)
Hemoglobin: 12.8 g/dL — ABNORMAL LOW (ref 13.0–17.0)
Immature Granulocytes: 1 %
Lymphocytes Relative: 7 %
Lymphs Abs: 0.4 K/uL — ABNORMAL LOW (ref 0.7–4.0)
MCH: 31.3 pg (ref 26.0–34.0)
MCHC: 33.6 g/dL (ref 30.0–36.0)
MCV: 93.2 fL (ref 80.0–100.0)
Monocytes Absolute: 0.2 K/uL (ref 0.1–1.0)
Monocytes Relative: 2 %
Neutro Abs: 5.6 K/uL (ref 1.7–7.7)
Neutrophils Relative %: 90 %
Platelets: 275 K/uL (ref 150–400)
RBC: 4.09 MIL/uL — ABNORMAL LOW (ref 4.22–5.81)
RDW: 14.4 % (ref 11.5–15.5)
WBC: 6.2 K/uL (ref 4.0–10.5)
nRBC: 0 % (ref 0.0–0.2)

## 2024-10-24 LAB — RESP PANEL BY RT-PCR (RSV, FLU A&B, COVID)  RVPGX2
Influenza A by PCR: NEGATIVE
Influenza B by PCR: NEGATIVE
Resp Syncytial Virus by PCR: NEGATIVE
SARS Coronavirus 2 by RT PCR: NEGATIVE

## 2024-10-24 LAB — COMPREHENSIVE METABOLIC PANEL WITH GFR
ALT: 28 U/L (ref 0–44)
AST: 31 U/L (ref 15–41)
Albumin: 4.1 g/dL (ref 3.5–5.0)
Alkaline Phosphatase: 82 U/L (ref 38–126)
Anion gap: 10 (ref 5–15)
BUN: 20 mg/dL (ref 8–23)
CO2: 22 mmol/L (ref 22–32)
Calcium: 9 mg/dL (ref 8.9–10.3)
Chloride: 105 mmol/L (ref 98–111)
Creatinine, Ser: 1.17 mg/dL (ref 0.61–1.24)
GFR, Estimated: >60 mL/min (ref >60)
Glucose, Bld: 386 mg/dL — ABNORMAL HIGH (ref 70–99)
Potassium: 4.3 mmol/L (ref 3.5–5.1)
Sodium: 137 mmol/L (ref 135–145)
Total Bilirubin: 0.5 mg/dL (ref 0.0–1.2)
Total Protein: 6.6 g/dL (ref 6.5–8.1)

## 2024-10-24 LAB — CBG MONITORING, ED
Glucose-Capillary: 354 mg/dL — ABNORMAL HIGH (ref 70–99)
Glucose-Capillary: 178 mg/dL — ABNORMAL HIGH (ref 70–99)

## 2024-10-24 LAB — URINALYSIS, ROUTINE W REFLEX MICROSCOPIC
Bacteria, UA: NONE SEEN
Bilirubin Urine: NEGATIVE
Glucose, UA: >=500 mg/dL — AB
Hgb urine dipstick: NEGATIVE
Ketones, ur: NEGATIVE mg/dL
Leukocytes,Ua: NEGATIVE
Nitrite: NEGATIVE
Protein, ur: NEGATIVE mg/dL
Specific Gravity, Urine: 1.025 (ref 1.005–1.030)
pH: 5 (ref 5.0–8.0)

## 2024-10-24 LAB — I-STAT CHEM 8, ED
BUN: 20 mg/dL (ref 8–23)
Calcium, Ion: 1.23 mmol/L (ref 1.15–1.40)
Chloride: 106 mmol/L (ref 98–111)
Creatinine, Ser: 1.1 mg/dL (ref 0.61–1.24)
Glucose, Bld: 379 mg/dL — ABNORMAL HIGH (ref 70–99)
HCT: 37 % — ABNORMAL LOW (ref 39.0–52.0)
Hemoglobin: 12.6 g/dL — ABNORMAL LOW (ref 13.0–17.0)
Potassium: 4.3 mmol/L (ref 3.5–5.1)
Sodium: 140 mmol/L (ref 135–145)
TCO2: 19 mmol/L — ABNORMAL LOW (ref 22–32)

## 2024-10-24 LAB — LIPASE, BLOOD: Lipase: 51 U/L (ref 11–51)

## 2024-10-24 MED ORDER — IPRATROPIUM-ALBUTEROL 0.5-2.5 (3) MG/3ML IN SOLN
3.0000 mL | Freq: Once | RESPIRATORY_TRACT | Status: AC
  Administered 2024-10-24: 3 mL via RESPIRATORY_TRACT
  Filled 2024-10-24: qty 3

## 2024-10-24 MED ORDER — IOHEXOL 350 MG/ML SOLN
75.0000 mL | Freq: Once | INTRAVENOUS | Status: AC | PRN
  Administered 2024-10-24: 75 mL via INTRAVENOUS

## 2024-10-24 MED ORDER — SODIUM CHLORIDE 0.9 % IV BOLUS
1000.0000 mL | Freq: Once | INTRAVENOUS | Status: AC
  Administered 2024-10-24: 1000 mL via INTRAVENOUS

## 2024-10-24 NOTE — Telephone Encounter (Signed)
 Noted patient sch see Gary Grimes 10/25/24

## 2024-10-24 NOTE — Telephone Encounter (Signed)
 FYI Only or Action Required?: FYI only for provider.  Patient was last seen in primary care on 10/23/2024 by Waddell Krabbe, PA-C.  Called Nurse Triage reporting Cough.  Symptoms began about a month ago.  Interventions attempted: Prescription medications: Meds called in when seen yesterday.  Symptoms are: unchanged.  Triage Disposition: See Physician Within 24 Hours  Patient/caregiver understands and will follow disposition?: Yes      Copied from CRM [id number]. Topic: Clinical - Red Word Triage >> Oct 24, 2024  3:31 PM Burnard DEL wrote: Red Word that prompted transfer to Nurse Triage: hard time breathing  ,laryngitis Reason for Disposition  [1] Continuous (nonstop) coughing interferes with work or school AND [2] no improvement using cough treatment per Care Advice  Answer Assessment - Initial Assessment Questions Patient states he is also concerned because his blood sugar was 66 yesterday. Requested to be revaluated in office for symptoms.   1. ONSET: When did the cough begin?      One month ago  2. SEVERITY: How bad is the cough today?      Moderate and can be severe and takes his breath away  3. SPUTUM: Describe the color of your sputum (e.g., none, dry cough; clear, white, yellow, green)     Green in color  4. HEMOPTYSIS: Are you coughing up any blood? If Yes, ask: How much? (e.g., flecks, streaks, tablespoons, etc.)     Denies  5. DIFFICULTY BREATHING: Are you having difficulty breathing? If Yes, ask: How bad is it? (e.g., mild, moderate, severe)      Mild  6. FEVER: Do you have a fever? If Yes, ask: What is your temperature, how was it measured, and when did it start?     Unsure  7. OTHER SYMPTOMS: Do you have any other symptoms? (e.g., runny nose, wheezing, chest pain)      Wheezing  Protocols used: Cough - Acute Productive-A-AH

## 2024-10-24 NOTE — Discharge Instructions (Addendum)
 Please continue taking your steroids as prescribed, please monitor your blood sugar closely.  If you experience any worsening symptoms please return to the emergency department.

## 2024-10-24 NOTE — Telephone Encounter (Signed)
 FYI Only or Action Required?: FYI only for provider.  Patient was last seen in primary care on 10/23/2024 by Waddell Krabbe, PA-C.  Called Nurse Triage reporting Blood Sugar Problem.  Symptoms began today.  Interventions attempted: Nothing.  Symptoms are: unchanged.  Triage Disposition: Call EMS 911 Now  Patient/caregiver understands and will follow disposition?: Yes     Copied from CRM [id number]. Topic: Clinical - Red Word Triage >> Oct 24, 2024  5:06 PM Gary Grimes wrote: Red Word that prompted transfer to Nurse Triage: blood sugar is 425 Reason for Disposition  Very weak (e.g., can't stand)  Answer Assessment - Initial Assessment Questions 1. BLOOD GLUCOSE: What is your blood glucose level?      425 now 2. ONSET: When did you check the blood glucose?     Today/now 3. USUAL RANGE: What is your glucose level usually? (e.g., usual fasting morning value, usual evening value)     na 4. KETONES: Do you check for ketones (urine or blood test strips)? If Yes, ask: What does the test show now?      unsure 5. TYPE 1 or 2:  Do you know what type of diabetes you have?  (e.g., Type 1, Type 2, Gestational; doesn't know)      Type 2 6. INSULIN : Do you take insulin ? What type of insulin (s) do you use? What is the mode of delivery? (syringe, pen; injection or pump)?      na 7. DIABETES PILLS: Do you take any pills for your diabetes? If Yes, ask: Have you missed taking any pills recently?     na 8. OTHER SYMPTOMS: Do you have any symptoms? (e.g., fever, frequent urination, difficulty breathing, dizziness, weakness, vomiting)    Weak, dizzy, frequent, strong urine order, SOB< URI< sinus infection, ear infection 9. PREGNANCY: Is there any chance you are pregnant? When was your last menstrual period?     na  Yesterday 300  then bottomed out 65 last night.  Pt weak, tired, lightheaded with strong urine and frequent urination.  Nurse called 911 to take pt to  ED   Pt also on prednisone  for URI and sins us  infection  Protocols used: Diabetes - High Blood Sugar-A-AH

## 2024-10-24 NOTE — ED Triage Notes (Addendum)
 PT BIBA from home for hyperglycemia.  Per EMS pt takes 50 u in am and pm.  Pt has been six with URI symptoms x 1 mth, started on prednisone  causing pt to have fluctuations in glucose levels.    CBG 387 1000 Units of [place] given then recheck of CBG was 395 18g in LAC 150/72 106 HR 97% RA

## 2024-10-24 NOTE — ED Provider Notes (Signed)
 [place] EMERGENCY DEPARTMENT AT [facility] Provider Note   CSN: [id number] Arrival date & time: 10/24/24  1831     Patient presents with: No chief complaint on file.   Andy E Falero is a 63 y.o. male.   63 year old male with a past medical history of diabetes presents to the ED with chief complaint of URI for the past month.  Reports that he was evaluated by PCP 3 times, placed on 2 rounds of antibiotics, had 2 rounds of prednisone , was also given a cortisone shot without any improvement in symptoms.  Continues to endorse tightness to his upper chest, severe cough that takes my breath away and now I have laryngitis .  He reports his blood sugar has been all over the place last night he was hypoglycemic with a blood sugar of 65, today his blood sugar was over 300.  Does endorse a low-grade temp of 99.2, does get short of breath with any type of ambulation.  He is currently still on prednisone  twice daily without any improvement in his symptoms.  Has taken amoxicillin  as well without any improvement.  Denies any orthopnea, no leg swelling, no chest pain.  Denies any tobacco use.  The history is provided by the patient.       Prior to Admission medications   Medication Sig Start Date End Date Taking? Authorizing Provider  albuterol  (VENTOLIN  HFA) 108 (90 Base) MCG/ACT inhaler Inhale 2 puffs into the lungs every 6 (six) hours as needed for wheezing or shortness of breath. 10/23/24   Waddell Krabbe, PA-C  amoxicillin -clavulanate (AUGMENTIN ) 875-125 MG tablet Take 1 tablet by mouth 2 (two) times daily for 10 days. 10/23/24 11/02/24  Waddell Krabbe, PA-C  apixaban  (ELIQUIS ) 5 MG TABS tablet Take 1 tablet (5 mg total) by mouth 2 (two) times daily. 01/07/24   Revankar, Jennifer SAUNDERS, MD  Ascorbic Acid  (VITAMIN C ) 1000 MG tablet Take 1,000 mg by mouth daily.    [provider]  aspirin  EC 81 MG tablet Take 1 tablet (81 mg total) by mouth daily. Swallow whole. 10/17/21   Thedora Garnette HERO, MD  atomoxetine  (STRATTERA ) 40 MG capsule TAKE ONE (1) CAPSULE BY MOUTH 2 TIMES DAILY 08/01/24   Thedora Garnette HERO, MD  atorvastatin  (LIPITOR) 10 MG tablet Take 1 tablet (10 mg total) by mouth daily. 09/15/24   Trixie File, MD  cariprazine (VRAYLAR) 3 MG capsule Take 3 mg by mouth daily.    [provider]  Continuous Glucose Sensor (FREESTYLE LIBRE 3 PLUS SENSOR) MISC 1 each by Does not apply route every 14 (fourteen) days. 09/15/24   Trixie File, MD  fluticasone  (FLONASE ) 50 MCG/ACT nasal spray Place 1 spray into both nostrils daily. 10/23/24   Waddell Krabbe, PA-C  hydrOXYzine  (ATARAX ) 25 MG tablet Take 25 mg by mouth at bedtime. 06/22/23   [provider]  ibuprofen  (ADVIL ) 800 MG tablet Take 1 tablet (800 mg total) by mouth every 8 (eight) hours as needed. 04/28/23   Margrette Taft BRAVO, MD  Insulin  Pen Needle (SURE COMFORT PEN NEEDLES) 32G X 4 MM MISC Use daily to inject insulin  01/19/24   Trixie File, MD  insulin  regular human CONCENTRATED (HUMULIN  R U-500 KWIKPEN) 500 UNIT/ML KwikPen Inject under skin 60-80 units daily as advised 09/15/24   Gherghe, Cristina, MD  levocetirizine (XYZAL) 5 MG tablet Take 1 tablet (5 mg total) by mouth every evening. 10/23/24   Waddell Krabbe, PA-C  lidocaine  (LIDODERM ) 5 % Place 1 patch onto  the skin daily. Remove & Discard patch within 12 hours or as directed by MD 03/10/24   Desiderio Chew, PA-C  loperamide  (IMODIUM  A-D) 2 MG tablet Take 1 tablet (2 mg total) by mouth 4 (four) times daily as needed for diarrhea or loose stools. 07/22/22   Sebastian Beverley NOVAK, MD  methocarbamol  (ROBAXIN ) 500 MG tablet Take 2 tablets (1,000 mg total) by mouth 4 (four) times daily. Patient not taking: Reported on 10/23/2024 03/10/24   Desiderio Chew, PA-C  olmesartan  (BENICAR ) 20 MG tablet Take 1 tablet (20 mg total) by mouth daily. 09/05/24   Thedora Garnette HERO, MD  Omega-3 Fatty Acids (FISH OIL ) 1000 MG CAPS Take 2 capsules (2,000 mg total) by  mouth 2 (two) times daily. 06/14/24   Revankar, Jennifer SAUNDERS, MD  ondansetron  (ZOFRAN ) 4 MG tablet Take 1 tablet (4 mg total) by mouth every 6 (six) hours. Patient not taking: Reported on 10/23/2024 09/05/24   Thedora Garnette HERO, MD  ondansetron  (ZOFRAN -ODT) 4 MG disintegrating tablet Take 1 tablet (4 mg total) by mouth every 8 (eight) hours as needed. 03/17/24   Thedora Garnette HERO, MD  predniSONE  (DELTASONE ) 20 MG tablet Take 1 tablet (20 mg total) by mouth 2 (two) times daily with a meal for 4 days, THEN 1 tablet (20 mg total) daily for 4 days. 10/23/24 10/31/24  Waddell Krabbe, PA-C  promethazine -dextromethorphan (PROMETHAZINE -DM) 6.25-15 MG/5ML syrup Take 5 mLs by mouth 4 (four) times daily as needed for cough. 10/23/24   Waddell Krabbe, PA-C  tirzepatide  (MOUNJARO ) 15 MG/0.5ML Pen Inject 15 mg into the skin once a week. 10/10/24   Thedora Garnette HERO, MD  traMADol  (ULTRAM ) 50 MG tablet Take 1 tablet (50 mg total) by mouth every 8 (eight) hours as needed. 09/11/24   Williams, Megan E, NP  traZODone  (DESYREL ) 100 MG tablet TAKE TWO TABLETS BY MOUTH EVERY NIGHT AT BEDTIME 06/22/24   Thedora Garnette HERO, MD  triazolam  (HALCION ) 0.25 MG tablet Take 1 tablet (0.25 mg total) by mouth at bedtime as needed for sleep. 10/04/24   Neda Jennet LABOR, MD    Allergies: Morphine     Review of Systems  Constitutional:  Positive for fever. Negative for chills.  HENT:  Negative for sore throat.   Respiratory:  Positive for shortness of breath.   Cardiovascular:  Positive for chest pain. Negative for leg swelling.  Gastrointestinal:  Negative for abdominal pain, nausea and vomiting.  Genitourinary:  Negative for flank pain.  Musculoskeletal:  Negative for back pain.  All other systems reviewed and are negative.   Updated Vital Signs BP 133/83   Pulse 86   Temp 99.3 F (37.4 C) (Oral)   Resp 11   SpO2 98%   Physical Exam Vitals and nursing note reviewed.  Constitutional:      Appearance: Normal appearance.  HENT:      Head: Normocephalic and atraumatic.     Nose: Nose normal.  Eyes:     Pupils: Pupils are equal, round, and reactive to light.  Cardiovascular:     Rate and Rhythm: Tachycardia present.  Pulmonary:     Effort: Pulmonary effort is normal.     Breath sounds: Rhonchi present.  Abdominal:     General: Abdomen is flat.     Palpations: Abdomen is soft.     Tenderness: There is no abdominal tenderness.  Musculoskeletal:     Cervical back: Normal range of motion and neck supple.  Skin:    General: Skin is warm and dry.  Neurological:     Mental Status: He is alert and oriented to person, place, and time.     (all labs ordered are listed, but only abnormal results are displayed) Labs Reviewed  URINALYSIS, ROUTINE W REFLEX MICROSCOPIC - Abnormal; Notable for the following components:      Result Value   Color, Urine STRAW (*)    Glucose, UA >=500 (*)    All other components within normal limits  CBC WITH DIFFERENTIAL/PLATELET - Abnormal; Notable for the following components:   RBC 4.09 (*)    Hemoglobin 12.8 (*)    HCT 38.1 (*)    Lymphs Abs 0.4 (*)    All other components within normal limits  COMPREHENSIVE METABOLIC PANEL WITH GFR - Abnormal; Notable for the following components:   Glucose, Bld 386 (*)    All other components within normal limits  CBG MONITORING, ED - Abnormal; Notable for the following components:   Glucose-Capillary 354 (*)    All other components within normal limits  I-STAT CHEM 8, ED - Abnormal; Notable for the following components:   Glucose, Bld 379 (*)    TCO2 19 (*)    Hemoglobin 12.6 (*)    HCT 37.0 (*)    All other components within normal limits  RESP PANEL BY RT-PCR (RSV, FLU A&B, COVID)  RVPGX2  LIPASE, BLOOD  CBG MONITORING, ED    EKG: None  Radiology: CT Angio Chest PE W and/or Wo Contrast Result Date: 10/24/2024 EXAM: CTA of the Chest with contrast for PE 10/24/2024 09:16:51 PM TECHNIQUE: CTA of the chest was performed after the  administration of 75 mL iohexol  (OMNIPAQUE ) 350 MG/ML intravenous contrast. Multiplanar reformatted images are provided for review. MIP images are provided for review. Automated exposure control, iterative reconstruction, and/or weight based adjustment of the mA/kV was utilized to reduce the radiation dose to as low as reasonably achievable. COMPARISON: None available. CLINICAL HISTORY: Pulmonary embolism (PE) suspected, high prob. FINDINGS: PULMONARY ARTERIES: Pulmonary arteries are adequately opacified for evaluation. No pulmonary embolism. Main pulmonary artery is normal in caliber. MEDIASTINUM: The heart and pericardium demonstrate no acute abnormality. There are atherosclerotic calcifications of the coronary arteries. There is no acute abnormality of the thoracic aorta. LYMPH NODES: No mediastinal, hilar or axillary lymphadenopathy. LUNGS AND PLEURA: The lungs are without acute process. No focal consolidation or pulmonary edema. No pleural effusion or pneumothorax. UPPER ABDOMEN: Limited images of the upper abdomen demonstrate a 3.5 cm left renal cyst and a 2.8 cm left renal cyst. SOFT TISSUES AND BONES: No acute bone or soft tissue abnormality. IMPRESSION: 1. No pulmonary embolism. 2. Atherosclerotic calcifications of the coronary arteries. 3. Incidental left renal cysts. Electronically signed by: Greig Pique MD 10/24/2024 09:29 PM EDT RP Workstation: [id number]   DG Chest 2 View Result Date: 10/24/2024 CLINICAL DATA:  Cough for 3 weeks. EXAM: CHEST - 2 VIEW COMPARISON:  Radiograph yesterday FINDINGS: The cardiomediastinal contours are normal. The lungs are clear. Pulmonary vasculature is normal. No consolidation, pleural effusion, or pneumothorax. No acute osseous abnormalities are seen. IMPRESSION: No active cardiopulmonary disease. Electronically Signed   By: Andrea Gasman M.D.   On: 10/24/2024 19:27   DG Chest 2 View Result Date: 10/23/2024 EXAM: 2 VIEW(S) XRAY OF THE CHEST 10/23/2024 10:32:32  AM COMPARISON: Comparison 09/17/2023. CLINICAL HISTORY: cough SOB. Initial visit for URI symptoms, productive cough, chest congestion and laryngitis FINDINGS: LUNGS AND PLEURA: No focal pulmonary opacity. No pleural effusion. HEART AND MEDIASTINUM: No acute abnormality of the cardiac  and mediastinal silhouettes. BONES AND SOFT TISSUES: No acute osseous abnormality. IMPRESSION: 1. No acute cardiopulmonary process. Electronically signed by: Oneil Devonshire MD 10/23/2024 11:04 AM EDT RP Workstation: [id number]     Procedures   Medications Ordered in the ED  sodium chloride  0.9 % bolus 1,000 mL (1,000 mLs Intravenous New Bag/Given 10/24/24 2034)  ipratropium-albuterol  (DUONEB) 0.5-2.5 (3) MG/3ML nebulizer solution 3 mL (3 mLs Nebulization Given 10/24/24 2034)  iohexol  (OMNIPAQUE ) 350 MG/ML injection 75 mL (75 mLs Intravenous Contrast Given 10/24/24 2109)                                    Medical Decision Making Amount and/or Complexity of Data Reviewed Labs: ordered. Radiology: ordered.  Risk Prescription drug management.    This patient presents to the ED for concern of hyperglycemia, this involves a number of treatment options, and is a complaint that carries with it a high risk of complications and morbidity.  The differential diagnosis includes infection, medication noncompliance versus electrolyte derangement.    Co morbidities: Discussed in HPI   Brief History:  See HPI.   EMR reviewed including pt PMHx, past surgical history and past visits to ER.   See HPI for more details   Lab Tests:  I ordered and independently interpreted labs.  The pertinent results include:    I personally reviewed all laboratory work and imaging. Metabolic panel without any acute abnormality specifically kidney function within normal limits and no significant electrolyte abnormalities. CBC without leukocytosis or significant anemia.  UA with no nitrates, leukocytes or signs of infection.  Lipase  levels normal.   Imaging Studies:  X-ray of the chest without any acute finding.  CT angio chest was obtained to rule out any blood clot versus pneumonia.  Negative for both.  Cardiac Monitoring:  The patient was maintained on a cardiac monitor.  I personally viewed and interpreted the cardiac monitored which showed an underlying rhythm of:NSR 96 EKG non-ischemic   Medicines ordered:  I ordered medication including bolus, DuoNeb for symptomatic treatment Reevaluation of the patient after these medicines showed that the patient improved I have reviewed the patients home medicines and have made adjustments as needed  Reevaluation:  After the interventions noted above I re-evaluated patient and found that they have :improved   Social Determinants of Health:  Given cab voucher  Problem List / ED Course:  Patient presented to the ED with a chief complaint of hyperglycemia, reports his blood sugar has been on and off and numbers over the last couple days.  He recently started a steroid pack on Monday, reports that this is due to his constant cough, bronchitis that he was previously diagnosed with.  He also tells me that he was on 2 different antibiotics to treat pneumonia.  He does report being previously on amoxicillin , now is on his second round of steroids.  He continues to have this dry cough, reports he coughed so hard that it makes his chest really hurt .  Creatinine on today's visit did not show any acute findings, no leukocytosis, CMP with no electrolyte derangement, glucose is slightly elevated at 386, reports that he last ate this morning.  Does have a fever of 99.3, he obtained did not show any active pneumonia at this time.  He also tells me he has a prior history of A-fib, did have an ablation the month of May, states that he is  no longer on anticoagulants some concern for pulmonary embolism although this seems to be more so infectious. Respiratory panel is negative for  influenza, RSV, COVID-19.  CT angio without any acute findings at this time.  With no signs of infection at this time, we discussed supportive treatment, likely bronchitis in nature.  Will need to rest, patient was actively speaking on the phone when I walked in the second time around.  He remains hemodynamically stable, stable for discharge.  Dispostion:  After consideration of the diagnostic results and the patients response to treatment, I feel that the patent would benefit from supportive treatment.   Portions of this note were generated with Scientist, clinical (histocompatibility and immunogenetics). Dictation errors may occur despite best attempts at proofreading.   Final diagnoses:  Hyperglycemia  Shortness of breath    ED Discharge Orders     None          Maureen Broad, DEVONNA 10/24/24 2240    Dean Clarity, MD 10/24/24 2242

## 2024-10-25 ENCOUNTER — Ambulatory Visit: Admitting: Internal Medicine

## 2024-10-26 ENCOUNTER — Ambulatory Visit: Payer: Self-pay

## 2024-10-26 NOTE — Telephone Encounter (Signed)
 Patient notified that no appointments.  He will call back tomorrow if not feeling better to see about getting an appointment at another location.  Dm/cma

## 2024-10-26 NOTE — Telephone Encounter (Signed)
 FYI Only or Action Required?: Action required by provider: request for appointment.  Patient was last seen in primary care on 10/23/2024 by Waddell Krabbe, PA-C.  Called Nurse Triage reporting Cough.  Symptoms began about a month ago.  Interventions attempted: Other: Seen in the ED on 10/28.  Symptoms are: unchanged.  Triage Disposition: See PCP When Office is Open (Within 3 Days)  Patient/caregiver understands and will follow disposition?: No, wishes to speak with PCP     Copied from CRM #[id number]. Topic: Clinical - Red Word Triage >> Oct 26, 2024 11:59 AM Roselie BROCKS wrote: [facility] that prompted transfer to Nurse Triage: Patient states he feels horrible. Acky ,fever, severe productive cough      Reason for Disposition  Cough has been present for > 3 weeks  Answer Assessment - Initial Assessment Questions Patient seen in the ED for the same on 10/28. Patient requesting an appointment for today or tomorrow. Patient advised no appointments are available for those days. Patient would like to be called if an appointment opens up, otherwise he states he will go back to the ED.       1. ONSET: When did the cough begin?      About a month  2. SEVERITY: How bad is the cough today?      Moderate  3. SPUTUM: Describe the color of your sputum (e.g., none, dry cough; clear, white, yellow, green)     Green 4. HEMOPTYSIS: Are you coughing up any blood? If Yes, ask: How much? (e.g., flecks, streaks, tablespoons, etc.)     No 5. DIFFICULTY BREATHING: Are you having difficulty breathing? If Yes, ask: How bad is it? (e.g., mild, moderate, severe)      Moderate 6. FEVER: Do you have a fever? If Yes, ask: What is your temperature, how was it measured, and when did it start?     99.2 F 7. CARDIAC HISTORY: Do you have any history of heart disease? (e.g., heart attack, congestive heart failure)      Yes 8. LUNG HISTORY: Do you have any history of lung disease?   (e.g., pulmonary embolus, asthma, emphysema)     Yes 9. PE RISK FACTORS: Do you have a history of blood clots? (or: recent major surgery, recent prolonged travel, bedridden)     No 10. OTHER SYMPTOMS: Do you have any other symptoms? (e.g., runny nose, wheezing, chest pain)       Body aches  Protocols used: Cough - Acute Productive-A-AH

## 2024-10-30 ENCOUNTER — Encounter: Payer: Self-pay | Admitting: Radiology

## 2024-11-01 DIAGNOSIS — F3160 Bipolar disorder, current episode mixed, unspecified: Secondary | ICD-10-CM | POA: Diagnosis not present

## 2024-11-03 ENCOUNTER — Ambulatory Visit: Admitting: Nurse Practitioner

## 2024-11-06 ENCOUNTER — Other Ambulatory Visit: Payer: Self-pay | Admitting: Family Medicine

## 2024-11-06 DIAGNOSIS — E1140 Type 2 diabetes mellitus with diabetic neuropathy, unspecified: Secondary | ICD-10-CM

## 2024-11-06 NOTE — Telephone Encounter (Signed)
 Copied from CRM [id number]. Topic: Clinical - Medication Refill >> Nov 06, 2024  3:08 PM Leah W wrote: Medication: tirzepatide  (MOUNJARO ) 15 MG/0.5ML Pen   Has the patient contacted their pharmacy? No (Agent: If no, request that the patient contact the pharmacy for the refill. If patient does not wish to contact the pharmacy document the reason why and proceed with request.) (Agent: If yes, when and what did the pharmacy advise?)  This is the patient's preferred pharmacy:  [facility] - [place], [place] - [address] [place] [address] Phone: [phone] Fax: [phone]   Is this the correct pharmacy for this prescription? Yes If no, delete pharmacy and type the correct one.   Has the prescription been filled recently? No  Is the patient out of the medication? Yes  Has the patient been seen for an appointment in the last year OR does the patient have an upcoming appointment? Yes  Can we respond through MyChart? No  Agent: Please be advised that Rx refills may take up to 3 business days. We ask that you follow-up with your pharmacy.

## 2024-11-06 NOTE — Telephone Encounter (Signed)
 Called [facility] and was advised that they will not fill this RX due to his insurance isn't reimbursing them and they will lose $132.  They are going to call him to let him know.  Patient notified VIA phone and will call the pharmacy to find out what might be going on.   Dm/cma

## 2024-11-07 ENCOUNTER — Ambulatory Visit: Payer: Self-pay

## 2024-11-07 NOTE — Telephone Encounter (Signed)
 Has appointment 11/08/2024 at 1:40 PM. Dm/cma

## 2024-11-07 NOTE — Telephone Encounter (Signed)
  FYI Only or Action Required?: FYI only for provider: appointment scheduled on 11/08/2024.  Patient was last seen in primary care on 10/23/2024 by Waddell Krabbe, PA-C.  Called Nurse Triage reporting Dizziness.  Symptoms began a week ago.  Interventions attempted: Rest, hydration, or home remedies.  Symptoms are: gradually worsening.  Triage Disposition: See Physician Within 24 Hours  Patient/caregiver understands and will follow disposition?: Yes  Copied from CRM [id number]. Topic: Clinical - Red Word Triage >> Nov 07, 2024  3:46 PM Aisha D wrote: Red Word that prompted transfer to Nurse Triage: dizziness  Pt stated that he has been experiencing dizziness for about a week now. Pt stated that when it happens he feels like he is going to pass out. Reason for Disposition  [1] MODERATE dizziness (e.g., interferes with normal activities) AND [2] has NOT been evaluated by doctor (or NP/PA) for this  (Exception: Dizziness caused by heat exposure, sudden standing, or poor fluid intake.)  Answer Assessment - Initial Assessment Questions 3. VERTIGO: Do you feel like either you or the room is spinning or tilting? (i.e., vertigo)     Has experienced vertigo in the past and reports that the currently feels the room spinning and tilting 4. SEVERITY: How bad is it?  Do you feel like you are going to faint? Can you stand and walk?     Does feel at times like he may pass out 5. ONSET:  When did the dizziness begin?     One week ago 6. AGGRAVATING FACTORS: Does anything make it worse? (e.g., standing, change in head position)     Changing position from sit to stand or even while patient has been standing for a while 7. HEART RATE: Can you tell me your heart rate? How many beats in 15 seconds?   No discernable change in heart rate during dizziness 8. CAUSE: What do you think is causing the dizziness? (e.g., decreased fluids or food, diarrhea, emotional distress, heat exposure, new  medicine, sudden standing, vomiting; unknown)     Has checked Blood pressure and blood sugar, but they are all WNL 9. RECURRENT SYMPTOM: Have you had dizziness before? If Yes, ask: When was the last time? What happened that time?     Vertigo four years ago 10. OTHER SYMPTOMS: Do you have any other symptoms? (e.g., fever, chest pain, vomiting, diarrhea, bleeding)       denies  Protocols used: Dizziness - Lightheadedness-A-AH

## 2024-11-08 ENCOUNTER — Telehealth: Payer: Self-pay

## 2024-11-08 ENCOUNTER — Ambulatory Visit

## 2024-11-08 ENCOUNTER — Ambulatory Visit: Admitting: Family Medicine

## 2024-11-08 ENCOUNTER — Ambulatory Visit (INDEPENDENT_AMBULATORY_CARE_PROVIDER_SITE_OTHER)

## 2024-11-08 ENCOUNTER — Encounter: Payer: Self-pay | Admitting: Family Medicine

## 2024-11-08 DIAGNOSIS — I951 Orthostatic hypotension: Secondary | ICD-10-CM | POA: Diagnosis not present

## 2024-11-08 DIAGNOSIS — M2141 Flat foot [pes planus] (acquired), right foot: Secondary | ICD-10-CM

## 2024-11-08 DIAGNOSIS — M2032 Hallux varus (acquired), left foot: Secondary | ICD-10-CM | POA: Diagnosis not present

## 2024-11-08 DIAGNOSIS — M216X2 Other acquired deformities of left foot: Secondary | ICD-10-CM

## 2024-11-08 DIAGNOSIS — I10 Essential (primary) hypertension: Secondary | ICD-10-CM | POA: Diagnosis not present

## 2024-11-08 DIAGNOSIS — M2142 Flat foot [pes planus] (acquired), left foot: Secondary | ICD-10-CM | POA: Diagnosis not present

## 2024-11-08 DIAGNOSIS — M216X1 Other acquired deformities of right foot: Secondary | ICD-10-CM | POA: Diagnosis not present

## 2024-11-08 MED ORDER — MEDICAL COMPRESSION STOCKINGS MISC: 1.0000 | Freq: Every day | 0 refills | Status: AC

## 2024-11-08 MED ORDER — MIDODRINE HCL 2.5 MG PO TABS
2.5000 mg | ORAL_TABLET | Freq: Three times a day (TID) | ORAL | 0 refills | Status: DC
Start: 2024-11-08 — End: 2024-11-17

## 2024-11-08 MED ORDER — OLMESARTAN MEDOXOMIL 20 MG PO TABS
10.0000 mg | ORAL_TABLET | Freq: Every day | ORAL | 3 refills | Status: AC
Start: 2024-11-08 — End: 2025-11-03

## 2024-11-08 NOTE — Progress Notes (Signed)
 Patient presents today to pick up custom molded foot orthotics, diagnosed with over supination, BIL foot defomities by Dr. Gershon.   Orthotics were dispensed and fit was satisfactory. Reviewed instructions for break-in and wear. Written instructions given to patient.  Patient will follow up as needed.  Gary Grimes Cped

## 2024-11-08 NOTE — Patient Instructions (Addendum)
 It was very nice to see you today!  VISIT SUMMARY: During your visit, we addressed your severe dizziness related to orthostatic hypotension, your blood pressure management, and your recent medication changes. We also discussed your musculoskeletal pain and its impact on your daily activities.  YOUR PLAN: ORTHOSTATIC HYPOTENSION WITH ASSOCIATED DIZZINESS: You have chronic orthostatic hypotension causing severe dizziness when you stand up, which increases your risk of falling. -We prescribed midodrine 2.5 mg to be taken three times a day. -Wear compression stockings to help manage your symptoms. -We referred you to neurology for further evaluation of your dizziness. -Stop taking Xyzal as it may be contributing to your dizziness. - If you get severe symptoms of dizziness, passing out, loss of conscious, shortness of breath, chest pain, palpitations, very high blood pressure, please go to the emergency department  ESSENTIAL HYPERTENSION: Managing your high blood pressure is complicated by your orthostatic hypotension. -Continue taking olmesartan , but we may adjust the dose based on your blood pressure readings. -Monitor your blood pressure regularly at home and at work.  Return in about 2 weeks (around 11/22/2024) for orthostatic hypotension.   Take care, Arvella Hummer, MD, MS   PLEASE NOTE:  If you had any lab tests, please let us  know if you have not heard back within a few days. You may see your results on mychart before we have a chance to review them but we will give you a call once they are reviewed by us .   If we ordered any referrals today, please let us  know if you have not heard from their office within the next week.   If you had any urgent prescriptions sent in today, please check with the pharmacy within an hour of our visit to make sure the prescription was transmitted appropriately.   Please try these tips to maintain a healthy lifestyle:  Eat at least 3 REAL meals and 1-2  snacks per day.  Aim for no more than 5 hours between eating.  If you eat breakfast, please do so within one hour of getting up.   Each meal should contain half fruits/vegetables, one quarter protein, and one quarter carbs (no bigger than a computer mouse)  Cut down on sweet beverages. This includes juice, soda, and sweet tea.   Drink at least 1 glass of water with each meal and aim for at least 8 glasses per day  Exercise at least 150 minutes every week.

## 2024-11-08 NOTE — Progress Notes (Signed)
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   Protein, ur NEGATIVE NEGATIVE mg/dL   Nitrite NEGATIVE NEGATIVE   Leukocytes,Ua NEGATIVE NEGATIVE   RBC / HPF 0-5 0 - 5 RBC/hpf   WBC, UA 0-5 0 - 5 WBC/hpf   Bacteria, UA NONE SEEN NONE SEEN   Squamous Epithelial / HPF 0-5 0 - 5 /HPF    Comment: Performed at [facility], [address], [place], [place] [address]  CBC with Differential     Status: Abnormal   Collection Time: 10/24/24  6:58 PM  Result Value Ref Range   WBC 6.2 4.0 - 10.5 K/uL   RBC 4.09 (L) 4.22 - 5.81 MIL/uL   Hemoglobin 12.8 (L) 13.0 - 17.0 g/dL   HCT 61.8 (L) 60.9 - 47.9 %   MCV 93.2 80.0 - 100.0 fL   MCH 31.3 26.0 - 34.0 pg   MCHC 33.6 30.0 - 36.0 g/dL   RDW 85.5 88.4 - 84.4 %   Platelets 275 150 - 400 K/uL   nRBC 0.0 0.0 - 0.2 %   Neutrophils Relative % 90 %   Neutro Abs 5.6 1.7 - 7.7 K/uL   Lymphocytes Relative 7 %   Lymphs Abs 0.4 (L) 0.7 - 4.0 K/uL   Monocytes Relative 2 %   Monocytes Absolute 0.2 0.1 - 1.0 K/uL   Eosinophils Relative 0 %   Eosinophils Absolute 0.0 0.0 - 0.5 K/uL   Basophils Relative 0 %    Basophils Absolute 0.0 0.0 - 0.1 K/uL   Immature Granulocytes 1 %   Abs Immature Granulocytes 0.05 0.00 - 0.07 K/uL    Comment: Performed at [facility], [address], [place], [place] [address]  Comprehensive metabolic panel     Status: Abnormal   Collection Time: 10/24/24  6:58 PM  Result Value Ref Range   Sodium 137 135 - 145 mmol/L   Potassium 4.3 3.5 - 5.1 mmol/L   Chloride 105 98 - 111 mmol/L   CO2 22 22 - 32 mmol/L   Glucose, Bld 386 (H) 70 - 99 mg/dL    Comment: Glucose reference range applies only to samples taken after fasting for at least 8 hours.   BUN 20 8 - 23 mg/dL   Creatinine, Ser 8.82 0.61 - 1.24 mg/dL   Calcium  9.0 8.9 - 10.3 mg/dL   Total Protein 6.6 6.5 - 8.1 g/dL   Albumin  4.1 3.5 - 5.0 g/dL   AST 31 15 - 41 U/L   ALT 28 0 - 44 U/L   Alkaline Phosphatase 82 38 - 126 U/L   Total Bilirubin 0.5 0.0 - 1.2 mg/dL   GFR, Estimated >39 >39 mL/min    Comment: (NOTE) Calculated using the CKD-EPI Creatinine Equation (2021)    Anion gap 10 5 - 15    Comment: Performed at [facility], [address], [place], [place] [address]  Lipase, blood     Status: None   Collection Time: 10/24/24  6:58 PM  Result Value Ref Range   Lipase 51 11 - 51 U/L    Comment: Performed at [facility], [address], [place], [place] [address]  Resp panel by RT-PCR (RSV, Flu A&B, Covid) Anterior Nasal Swab     Status: None   Collection Time: 10/24/24  8:57 PM   Specimen: Anterior Nasal Swab  Result Value Ref Range   SARS Coronavirus 2 by RT PCR NEGATIVE NEGATIVE    Comment: (NOTE) SARS-CoV-2 target nucleic acids are NOT DETECTED.  The SARS-CoV-2 RNA is generally detectable in upper respiratory specimens during the acute phase of infection. The lowest concentration of SARS-CoV-2 viral copies this assay can detect is 138 copies/mL. A negative result does not preclude SARS-Cov-2 infection and should not be used as the sole  basis for treatment or other patient management decisions. A negative result may occur with  improper specimen collection/handling, submission of specimen other than nasopharyngeal swab, presence of viral mutation(s) within the areas targeted by this assay, and inadequate number of viral copies(<138 copies/mL). A negative result must be combined with clinical observations, patient history, and epidemiological information. The expected result is Negative.  Fact Sheet for Patients:  bloggercourse.com  Fact Sheet for Healthcare Providers:  seriousbroker.it  This test is no t yet approved or cleared by the [place]  FDA and  has been authorized for detection and/or diagnosis of SARS-CoV-2 by FDA under an Emergency Use Authorization (EUA). This EUA will remain  in effect (meaning this test can be used) for the duration of the COVID-19 declaration under Section 564(b)(1) of the Act, 21 U.S.C.section 360bbb-3(b)(1), unless the authorization is terminated  or revoked sooner.       Influenza A by PCR NEGATIVE NEGATIVE   Influenza B by PCR NEGATIVE NEGATIVE    Comment: (NOTE) The Xpert Xpress SARS-CoV-2/FLU/RSV plus assay is intended as an aid in the diagnosis of influenza from Nasopharyngeal swab specimens and should not be used as a sole basis for treatment. Nasal washings and aspirates are unacceptable for Xpert Xpress SARS-CoV-2/FLU/RSV testing.  Fact Sheet for Patients: bloggercourse.com  Fact Sheet for Healthcare Providers: seriousbroker.it  This test is not yet approved or cleared by the [place]  FDA and has been authorized for detection and/or diagnosis of SARS-CoV-2 by FDA under an Emergency Use Authorization (EUA). This EUA will remain in effect (meaning this test can be used) for the duration of the COVID-19 declaration under Section 564(b)(1) of the Act, 21  U.S.C. section 360bbb-3(b)(1), unless the authorization is terminated or revoked.     Resp Syncytial Virus by PCR NEGATIVE NEGATIVE    Comment: (NOTE) Fact Sheet for Patients: bloggercourse.com  Fact Sheet for Healthcare Providers: seriousbroker.it  This test is not yet approved or cleared by the [place]  FDA and has been authorized for detection and/or diagnosis of SARS-CoV-2 by FDA under an Emergency Use Authorization (EUA). This EUA will remain in effect (meaning this test can be used) for the duration of the COVID-19 declaration under Section 564(b)(1) of the Act, 21 U.S.C. section 360bbb-3(b)(1), unless the authorization is terminated or revoked.  Performed at [facility], [address], [place], [place] [address]   CBG monitoring, ED     Status: Abnormal   Collection Time: 10/24/24 10:48 PM  Result Value Ref Range   Glucose-Capillary 178 (H) 70 - 99 mg/dL    Comment: Glucose reference range applies only to samples taken after fasting for at least 8 hours.        Beverley KATHEE Hummer, MD  I,Emily Lagle,acting as a scribe for Beverley KATHEE Hummer, MD.,have documented all relevant documentation  on the behalf of Beverley KATHEE Hummer, MD.  I, Beverley KATHEE Hummer, MD, have reviewed all documentation for this visit. The documentation on 11/08/2024 for the exam, diagnosis, procedures, and orders are all accurate and complete.

## 2024-11-08 NOTE — Telephone Encounter (Signed)
 Copied from CRM [id number]. Topic: Clinical - Medical Advice >> Nov 08, 2024  4:21 PM Ashley R wrote: Reason for CRM: Just got order for compression socks. Would like to know if he should wear them 24/7 or just when on his feet. Prefer callback and mychart [phone]

## 2024-11-09 ENCOUNTER — Ambulatory Visit: Payer: Self-pay | Admitting: *Deleted

## 2024-11-09 NOTE — Telephone Encounter (Signed)
 FYI Only or Action Required?: Action required by provider: clinical question for provider.  Patient was last seen in primary care on 11/08/2024 by Sebastian Beverley NOVAK, MD.  Called Nurse Triage reporting Hypotension.  Symptoms began today.  Interventions attempted: Prescription medications: taking changed dose/medications as prescribed.  Symptoms are: stable.  Triage Disposition: See PCP When Office is Open (Within 3 Days)  Patient/caregiver understands and will follow disposition?: No, wishes to speak with PCP

## 2024-11-09 NOTE — Telephone Encounter (Signed)
 Copied from CRM [id number]. Topic: Clinical - Red Word Triage >> Nov 09, 2024  1:32 PM Victoria A wrote: [facility] that prompted transfer to Nurse Triage: Patient's blood pressure is bottoming out 84/56 just took Reason for Disposition  Brief (now gone) weakness or lightheadedness after standing up or eating  Answer Assessment - Initial Assessment Questions 1. BLOOD PRESSURE: What is your blood pressure? Did you take at least two measurements 5 minutes apart?     84/56- standing up,  recheck standing 112/74 2. ONSET: When did you take your blood pressure?     Today, 1:30 3. HOW: How did you take your blood pressure? (e.g., visiting nurse, automatic home BP monitor)     Automatic- arm 4. HISTORY: Do you have a history of low blood pressure? What is your blood pressure normally?     Yes- patient was advised to take half olmesartin 10mg  5. MEDICINES: Are you taking any medicines for blood pressure? If Yes, ask: Have they been changed recently?     Yes- patient was advised to half dose one and another was added Midodrine  6. PULSE RATE: Do you know what your pulse rate is?      101 7. OTHER SYMPTOMS: Have you been sick recently? Have you had a recent injury?     Dizziness with standing- was in office yesterday- patient is concerned that he is getting low reading with standing and wants to know when to expect improvement. Patient advised of hypotension perimeters and when to be seen. He will continue to monitor.  Protocols used: Blood Pressure - Low-A-AH

## 2024-11-10 NOTE — Telephone Encounter (Signed)
 Spoke to patient, he is doing okay and will continue drinking his water and taking the new medication.    He will call back next week if he feels he needs seen sooner than 11/17/24.  Dm/cma

## 2024-11-15 ENCOUNTER — Telehealth: Payer: Self-pay

## 2024-11-15 DIAGNOSIS — F3160 Bipolar disorder, current episode mixed, unspecified: Secondary | ICD-10-CM | POA: Diagnosis not present

## 2024-11-15 NOTE — Telephone Encounter (Signed)
 Called patient and gave him the number to [facility]  Phone: [phone]. Dm/cma

## 2024-11-15 NOTE — Telephone Encounter (Signed)
 Copied from CRM [id number]. Topic: Referral - Status >> Nov 15, 2024 10:21 AM Thersia BROCKS wrote: Reason for CRM: Patient called in regarding his referral for  Neurology , patient stated he was told if he didn't hear back ina week to callback in and he hasn't gotten a call would like a callback regarding this status

## 2024-11-16 ENCOUNTER — Ambulatory Visit: Admitting: Podiatry
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
## 2024-11-17 ENCOUNTER — Ambulatory Visit: Payer: Self-pay

## 2024-11-17 ENCOUNTER — Ambulatory Visit: Admitting: Physician Assistant

## 2024-11-17 ENCOUNTER — Ambulatory Visit: Admitting: Family Medicine

## 2024-11-17 VITALS — Ht 71.0 in | Wt 268.0 lb

## 2024-11-17 DIAGNOSIS — I10 Essential (primary) hypertension: Secondary | ICD-10-CM

## 2024-11-17 DIAGNOSIS — E1140 Type 2 diabetes mellitus with diabetic neuropathy, unspecified: Secondary | ICD-10-CM

## 2024-11-17 DIAGNOSIS — I951 Orthostatic hypotension: Secondary | ICD-10-CM | POA: Diagnosis not present

## 2024-11-17 DIAGNOSIS — R42 Dizziness and giddiness: Secondary | ICD-10-CM | POA: Insufficient documentation

## 2024-11-17 DIAGNOSIS — Z794 Long term (current) use of insulin: Secondary | ICD-10-CM | POA: Diagnosis not present

## 2024-11-17 MED ORDER — MIDODRINE HCL 2.5 MG PO TABS
2.5000 mg | ORAL_TABLET | Freq: Three times a day (TID) | ORAL | 3 refills | Status: AC
Start: 2024-11-17 — End: 2025-11-12

## 2024-11-17 MED ORDER — PIOGLITAZONE HCL 15 MG PO TABS: 15.0000 mg | ORAL_TABLET | Freq: Every day | ORAL | 3 refills | Status: AC

## 2024-11-17 NOTE — Progress Notes (Signed)
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 8 hours.   Calcium , Ion 1.23 1.15 - 1.40 mmol/L   TCO2 19 (L) 22 - 32 mmol/L   Hemoglobin 12.6 (L) 13.0 - 17.0 g/dL   HCT 62.9 (L) 60.9 - 47.9 %  Urinalysis, Routine w reflex microscopic -Urine, Clean Catch     Status: Abnormal   Collection Time: 10/24/24  6:57 PM  Result Value Ref Range   Color, Urine STRAW (A) YELLOW   APPearance CLEAR CLEAR   Specific Gravity, Urine 1.025 1.005 - 1.030   pH 5.0 5.0 - 8.0   Glucose, UA >=500 (A) NEGATIVE mg/dL   Hgb urine dipstick NEGATIVE NEGATIVE   Bilirubin Urine NEGATIVE NEGATIVE   Ketones, ur NEGATIVE NEGATIVE mg/dL   Protein, ur NEGATIVE NEGATIVE mg/dL   Nitrite NEGATIVE NEGATIVE   Leukocytes,Ua NEGATIVE NEGATIVE   RBC / HPF 0-5 0 - 5 RBC/hpf   WBC, UA 0-5 0 - 5 WBC/hpf   Bacteria, UA NONE SEEN NONE SEEN   Squamous Epithelial / HPF 0-5 0 - 5 /HPF    Comment: Performed at [facility], [address], [place], [place] [address]  CBC with Differential     Status: Abnormal   Collection Time: 10/24/24  6:58 PM  Result Value Ref Range   WBC 6.2 4.0 - 10.5 K/uL   RBC 4.09 (L) 4.22 - 5.81 MIL/uL    Hemoglobin 12.8 (L) 13.0 - 17.0 g/dL   HCT 61.8 (L) 60.9 - 47.9 %   MCV 93.2 80.0 - 100.0 fL   MCH 31.3 26.0 - 34.0 pg   MCHC 33.6 30.0 - 36.0 g/dL   RDW 85.5 88.4 - 84.4 %   Platelets 275 150 - 400 K/uL   nRBC 0.0 0.0 - 0.2 %   Neutrophils Relative % 90 %   Neutro Abs 5.6 1.7 - 7.7 K/uL   Lymphocytes Relative 7 %   Lymphs Abs 0.4 (L) 0.7 - 4.0 K/uL   Monocytes Relative 2 %   Monocytes Absolute 0.2 0.1 - 1.0 K/uL   Eosinophils Relative 0 %   Eosinophils Absolute 0.0 0.0 - 0.5 K/uL   Basophils Relative 0 %   Basophils Absolute 0.0 0.0 - 0.1 K/uL   Immature Granulocytes 1 %   Abs Immature Granulocytes 0.05 0.00 - 0.07 K/uL    Comment: Performed at [facility], [address] [place], [place] [address]  Comprehensive metabolic panel     Status: Abnormal   Collection Time: 10/24/24  6:58 PM  Result Value Ref Range   Sodium 137 135 - 145 mmol/L   Potassium 4.3 3.5 - 5.1 mmol/L   Chloride 105 98 - 111 mmol/L   CO2 22 22 - 32 mmol/L   Glucose, Bld 386 (H) 70 - 99 mg/dL    Comment: Glucose reference range applies only to samples taken after fasting for at least 8 hours.   BUN 20 8 - 23 mg/dL   Creatinine, Ser 8.82 0.61 - 1.24 mg/dL   Calcium  9.0 8.9 - 10.3 mg/dL   Total Protein 6.6 6.5 - 8.1 g/dL   Albumin  4.1 3.5 - 5.0 g/dL   AST 31 15 - 41 U/L  ALT 28 0 - 44 U/L   Alkaline Phosphatase 82 38 - 126 U/L   Total Bilirubin 0.5 0.0 - 1.2 mg/dL   GFR, Estimated >39 >39 mL/min    Comment: (NOTE) Calculated using the CKD-EPI Creatinine Equation (2021)    Anion gap 10 5 - 15    Comment: Performed at [facility], [address] [place], [place] [address]  Lipase, blood     Status: None   Collection Time: 10/24/24  6:58 PM  Result Value Ref Range   Lipase 51 11 - 51 U/L    Comment: Performed at [facility], [address], [place], [place] [address]  Resp panel by RT-PCR (RSV, Flu A&B, Covid) Anterior Nasal Swab      Status: None   Collection Time: 10/24/24  8:57 PM   Specimen: Anterior Nasal Swab  Result Value Ref Range   SARS Coronavirus 2 by RT PCR NEGATIVE NEGATIVE    Comment: (NOTE) SARS-CoV-2 target nucleic acids are NOT DETECTED.  The SARS-CoV-2 RNA is generally detectable in upper respiratory specimens during the acute phase of infection. The lowest concentration of SARS-CoV-2 viral copies this assay can detect is 138 copies/mL. A negative result does not preclude SARS-Cov-2 infection and should not be used as the sole basis for treatment or other patient management decisions. A negative result may occur with  improper specimen collection/handling, submission of specimen other than nasopharyngeal swab, presence of viral mutation(s) within the areas targeted by this assay, and inadequate number of viral copies(<138 copies/mL). A negative result must be combined with clinical observations, patient history, and epidemiological information. The expected result is Negative.  Fact Sheet for Patients:  bloggercourse.com  Fact Sheet for Healthcare Providers:  seriousbroker.it  This test is no t yet approved or cleared by the [place]  FDA and  has been authorized for detection and/or diagnosis of SARS-CoV-2 by FDA under an Emergency Use Authorization (EUA). This EUA will remain  in effect (meaning this test can be used) for the duration of the COVID-19 declaration under Section 564(b)(1) of the Act, 21 U.S.C.section 360bbb-3(b)(1), unless the authorization is terminated  or revoked sooner.       Influenza A by PCR NEGATIVE NEGATIVE   Influenza B by PCR NEGATIVE NEGATIVE    Comment: (NOTE) The Xpert Xpress SARS-CoV-2/FLU/RSV plus assay is intended as an aid in the diagnosis of influenza from Nasopharyngeal swab specimens and should not be used as a sole basis for treatment. Nasal washings and aspirates are unacceptable for Xpert Xpress  SARS-CoV-2/FLU/RSV testing.  Fact Sheet for Patients: bloggercourse.com  Fact Sheet for Healthcare Providers: seriousbroker.it  This test is not yet approved or cleared by the [place]  FDA and has been authorized for detection and/or diagnosis of SARS-CoV-2 by FDA under an Emergency Use Authorization (EUA). This EUA will remain in effect (meaning this test can be used) for the duration of the COVID-19 declaration under Section 564(b)(1) of the Act, 21 U.S.C. section 360bbb-3(b)(1), unless the authorization is terminated or revoked.     Resp Syncytial Virus by PCR NEGATIVE NEGATIVE    Comment: (NOTE) Fact Sheet for Patients: bloggercourse.com  Fact Sheet for Healthcare Providers: seriousbroker.it  This test is not yet approved or cleared by the [place]  FDA and has been authorized for detection and/or diagnosis of SARS-CoV-2 by FDA under an Emergency Use Authorization (EUA). This EUA will remain in effect (meaning this test can be used) for the duration of the COVID-19 declaration  under Section 564(b)(1) of the Act, 21 U.S.C. section 360bbb-3(b)(1), unless the authorization is terminated or revoked.  Performed at [facility], [address], [place], [place] [address]   CBG monitoring, ED     Status: Abnormal   Collection Time: 10/24/24 10:48 PM  Result Value Ref Range   Glucose-Capillary 178 (H) 70 - 99 mg/dL    Comment: Glucose reference range applies only to samples taken after fasting for at least 8 hours.        Beverley KATHEE Hummer, MD  I,Emily Lagle,acting as a scribe for Beverley KATHEE Hummer, MD.,have documented all relevant documentation on the behalf of Beverley KATHEE Hummer, MD.  LILLETTE Beverley KATHEE Hummer, MD, have reviewed all documentation for this visit. The documentation on 11/17/2024 for the exam, diagnosis, procedures, and orders are all  accurate and complete.

## 2024-11-17 NOTE — Patient Instructions (Addendum)
 It was very nice to see you today!  VISIT SUMMARY: You had a follow-up visit to discuss your orthostatic hypertension, type 2 diabetes, vertigo, and history of atrial fibrillation. Your orthostatic hypertension and vertigo symptoms are stable, but your blood glucose levels remain high. We have made some adjustments to your medications to help manage these conditions better.  YOUR PLAN: ORTHOSTATIC HYPOTENSION: You have longstanding orthostatic hypotension, but your symptoms are stable. -Continue taking midodrine  2.5 mg three times daily with meals. -Keep using compression stockings. -Maintain your current olmesartan  dosage at 10 mg daily. -Follow up with neurology for ongoing vertigo assessment.  TYPE 2 DIABETES MELLITUS, POORLY CONTROLLED: Your blood glucose levels are still high despite your current medications. -Start taking pioglitazone  15 mg once daily. -Continue your current medications: tirzepatide  15 mg weekly and [place] R U-500 insulin  pen 50 units twice daily. -Monitor your blood glucose levels regularly. -Follow up with your endocrinologist for diabetes management.  VERTIGO, IMPROVED: Your vertigo symptoms have improved. -Continue your current management for vertigo. -Consider doing vestibular rehabilitation exercises to reduce sensitivity to movement changes.  ATRIAL FIBRILLATION, HISTORY OF: You have a history of atrial fibrillation but no current heart failure symptoms. -Continue follow-up with your cardiologist for ongoing management of atrial fibrillation.  Return in about 1 month (around 12/17/2024), or if symptoms worsen or fail to improve.   Take care, Arvella Hummer, MD, MS   PLEASE NOTE:  If you had any lab tests, please let us  know if you have not heard back within a few days. You may see your results on mychart before we have a chance to review them but we will give you a call once they are reviewed by us .   If we ordered any referrals today, please let us   know if you have not heard from their office within the next week.   If you had any urgent prescriptions sent in today, please check with the pharmacy within an hour of our visit to make sure the prescription was transmitted appropriately.   Please try these tips to maintain a healthy lifestyle:  Eat at least 3 REAL meals and 1-2 snacks per day.  Aim for no more than 5 hours between eating.  If you eat breakfast, please do so within one hour of getting up.   Each meal should contain half fruits/vegetables, one quarter protein, and one quarter carbs (no bigger than a computer mouse)  Cut down on sweet beverages. This includes juice, soda, and sweet tea.   Drink at least 1 glass of water with each meal and aim for at least 8 glasses per day  Exercise at least 150 minutes every week.    Vestibular (Balance) Exercises Introduction  You have a problem with your balance or equilibrium. Do not be afraid of your dizziness. Only you can build up the tolerance in your brain to overcome your dizziness. It is like an exercise for muscle building. It requires regular, capacity-extending work to build up strength or tolerance. Keep provoking your dizziness many times each day, realizing that each purposeful, controlled episode of dizziness brings you closer to your last one. Once you have gained a measure of improvement and control in your practice sessions, seek out sports or other movement activities that have been difficult and spend increasing lengths of time on them until they no longer produce any symptoms.  How do vestibular exercises work?  The purpose of these exercises is to improve one's central or brain's compensation for injuries  or abnormalities within the vestibular or balance system. The brain interprets information gained from the vestibular or balance system. When there is an injury or abnormality in any portion of this system, the brain must be retrained or taught to interpret correctly  the information it receives. Vestibular exercises merely stimulate the vestibular apparatus. This stimulation produces information to be processed by the brain. The goal in repeating these exercises is for the brain to learn to tolerate and accurately interpret this type of stimulation. By doing these exercises repetitively, one can even teach the brain to adapt to an abnormal stimulus. These exercises work in much the same way as the exercises skaters or dancers do to keep from becoming dizzy when they spin around rapidly. Stated simply, one must seek out and overcome those positions or situations which cause dizziness. Avoiding them will only prolong one's convalescence.  Aims of exercises:  To train movement of the eyes, independent of the head. To practice balancing in everyday situations with special attention to developing the use of the eyes and the muscle sense awareness. To practice head movements that cause dizziness. To become accustomed to moving about naturally in daylight and in the dark. Generally, to encourage the re-building of confidence in making easy, relaxed, spontaneous movements. When you begin:  During the first few times the exercise is performed, you should have another person present in case the dizziness becomes very severe.  When should you stop doing the exercise?  These exercises should be done at least three times a day for a minimum of 6 to 12 weeks or until the dizziness goes away altogether. Stopping before complete resolution of dizziness often results in a relapse in symptoms. The point at which one stops the exercises is when one has no dizziness for two consecutive weeks. The exercise may be stopped and restarted again at any time if dizziness returns.  The following exercises should be performed twice daily. The exercises are designed to challenge your balance system and often cause symptoms of dizziness. This is a normal response to these stimulating  exercises. You should try to work through these symptoms if possible. If you feel you cannot, have your nurse contact an inpatient physical therapist for assistance.  Methods:  Work up to doing each movement 20 times. All exercises are started in exaggerated slow time and gradually increase speed to a more rapid rate. Be sure to continue exercises even though you become dizzy. Pause and rest only if you become nauseated or sick to your stomach. If ill, after resting, try a different exercise. If you still become sick to your stomach, postpone further work until your next session.  Head Exercises  Bending: In a sitting position, bend your head down to look at the floor then up to look at the ceiling. Lead your head with your eyes focusing on the floor and the ceiling. Repeat this 10 times. Stop and wait for symptoms to resolve, about 30 seconds. Repeat entire process 2 more times. Turning (side to side): In a sitting position, turn your head to the right and then left, leading your head with your eyes as if you are watching a tennis match. Turn your head at a speed brisk enough to generate symptoms but not so fast that you strain your neck. (Slowly first, then quickly.) Go back and forth 10 times, and then wait for 30 seconds (or until symptoms resolve). Repeat entire process 2 more times. As the dizziness improves:  Perform head exercises  with eyes closed. Progress to standing while performing head exercises. The closer together you put your feet, the more challenging it becomes.     Sitting  Shrug shoulders - 20 times. Turn shoulders to right and then to left - 20 times. Rotate head, shoulders and trunk - 20 times each: Rotate upper body right to left with eyes open, then repeat with eyes closed. Rotate upper body left to right with eyes open, then repeat with eyes closed. Bend forward and touch ground then sit up. Keep eyes focused on wall - 20 times. Bend forward and touch  ground then sit up. Move eyes to floor and back - 20 times. Eye movements (head is still): Up and down (focusing on finger). Side to side (focusing on finger). Finger to tip of nose and out (focusing on finger).  Standing Change from sitting to standing and back again 20 times with eyes open. Repeat with eyes closed. Standing with one foot in front of the other In a corner, practice standing "heel to toe" (one foot in front of the other with the heel of one foot touching the toe of the other foot) with eyes open for 30 seconds. The goal is to stand for the entire 30 seconds without touching the wall. You may make this more challenging by crossing arms across chest. If this is too hard at first, try standing "almost heel to toe" (with feet touching at big toes and ankles). Once you have mastered these with eyes open, practice with eyes closed. Standing on a cushion In a corner, stand on a couch cushion or several pillows. Try to stand still without touching the wall for 30 seconds. Practice with eyes open. When this is easy, practice with eyes closed. You may make this more challenging by placing feet closer together. Crossing arm across your chest also makes this more challenging. You should progress by performing this in the most challenging position possible. Standing and throwing Throw a small rubber ball from hand to hand above eye level. Throw ball from hand to hand under one knee. Stand with heels together Look straight ahead and hold balance (attempt only with assistance). Stand on one foot Perform first with eyes open then with eyes closed (attempt only with assistance). Miscellaneous Do activities involving stooping, stretching, bending, going up and down stairs (attempt only with assistance).  Walking Walking in a straight line In a hallway or next to a wall, practice walking in a straight line for 5 minutes with one foot in front of the other or "heel to toe" (with the heel of  one foot touching the toe of the other foot). If this is too hard at first, practice walking "almost heel to toe" and gradually work to heel to toe touching. Walking combined with head turning In a hallway or open space, practice walking in a straight line while turning head and eyes left and right with every other step (i.e. when you step with your left you look left, when you step with your right you look right). Continue for the length of the hallway or about 20 feet. Repeat the process 3 times. Now repeat the entire process 3 more times but this time looking at the ceiling or floor. You will need to rest between repetitions and let symptoms calm. Walk across the room Perform first with eyes open, then with eyes closed (attempt only with assistance). Lying Down Sitting on side of bed, quickly lie down to your left side swinging your  feet onto the bed as you do. Lie there for 30 seconds or until symptoms resolve. Repeat 3 times. Now repeat 3 times to the right. Eye Exercises - Gaze Stabilization Tips  Target must remain in focus, not blurry and appear stationary while head is in motion. Perform exercise with small head movement (45 degrees on either side). Speed of head motion should be increased as long as target remains in focus. If you wear glasses, wear them while performing exercises. These exercises may provoke dizziness or nausea. Try to work through these symptoms. Rest between each exercise. Exercises demand full concentration. Avoid distractions. For safety, standing exercises should be performed next to a counter or next to someone. Gaze Stabilization Keep eyes fixed on a single stationary target held in hand or placed on a wall 3-10 feet away. Now move head side to side for 30 seconds. Repeat 3 times. Now repeat 3 times while moving head up and down for 30 seconds. Do 3 sessions per day. You may progress this by beginning in a sitting position then move to standing with feet apart,  standing with feet together, standing heel to toe, marching in place, or standing on form. This will also be more difficult if object you are focusing on is placed on a "busy wallpaper" or a checkerboard. Smooth pursuit Holding a single target, keep eyes fixed on the target. Slowly move it side to side for 30 seconds while head stays still. Perform in the sitting position. Progress to the standing position as tolerated. Now repeat moving head up and down. Repeat 20 times in each direction per session. Do 3 sessions per day. Head and eyes same direction Holding a single target (playing card or pencil) keep eyes fixed on target. Slowly move target, head, and eyes in same direction (up and down, side to side) for 30 seconds. Perform in sitting position, you can progress this to standing as you improve. Repeat 3 times per session. This will be more difficult if you have a "background" of a busy wallpaper. Do 3 sessions per day. Head and eyes opposite direction Holding your target, keep your eyes focused on it and begin to slowly move target (up and down, side to side) while moving your head in the opposite direction of the target for 30 seconds. Repeat 3 times per session. You may progress from sitting to standing as in above exercise. Do 3 sessions per day. Continue to perform these exercises at least twice daily until your symptoms resolve. If these exercises do not produce symptoms, you do not need to continue them. Begin a walking program at home. You should walk 5 days a week. Start by walking for 5 minutes. Increase the length of walking time by 5 minutes each week until you can walk for 30 minutes continuously.

## 2024-11-17 NOTE — Telephone Encounter (Signed)
 FYI Only or Action Required?: FYI only for provider: question answered.  Patient was last seen in primary care on 11/17/2024 by Sebastian Beverley NOVAK, MD.  Called Nurse Triage reporting Advice Only.  Symptoms began n/a.  Interventions attempted: Other: n/a.  Symptoms are: n/a.  Triage Disposition: Information or Advice Only Call  Patient/caregiver understands and will follow disposition?: Yes Reason for Disposition  Health information question, no triage required and triager able to answer question  Answer Assessment - Initial Assessment Questions Prescribed pioglitazone  15 mg once daily today, patient wanted to clarify if he is also to continue his other diabetes medications. Relayed directions from OV note today PCP states Continue your current medications: tirzepatide  15 mg weekly and [place] R U-500 insulin  pen 50 units twice daily. Patient verbalized understanding.   1. REASON FOR CALL: What is the main reason for your call? or How can I best help you?     Medication clarification  2. SYMPTOMS : Do you have any symptoms?      Denies  Protocols used: Information Only Call - No Triage-A-AH  Copied from CRM [id number]. Topic: Clinical - Medication Question >> Nov 17, 2024  5:31 PM Shereese L wrote: Reason for CRM: Patient is calling for clarity on how he should take his medicine

## 2024-11-20 NOTE — Telephone Encounter (Signed)
 Noted. Dm/cma

## 2024-11-22 ENCOUNTER — Other Ambulatory Visit: Payer: Self-pay | Admitting: "Endocrinology

## 2024-11-22 ENCOUNTER — Telehealth: Payer: Self-pay | Admitting: Internal Medicine

## 2024-11-22 DIAGNOSIS — F3160 Bipolar disorder, current episode mixed, unspecified: Secondary | ICD-10-CM | POA: Diagnosis not present

## 2024-11-22 DIAGNOSIS — G4733 Obstructive sleep apnea (adult) (pediatric): Secondary | ICD-10-CM | POA: Diagnosis not present

## 2024-11-22 MED ORDER — NOVOLOG FLEXPEN 100 UNIT/ML ~~LOC~~ SOPN: PEN_INJECTOR | SUBCUTANEOUS | 0 refills | Status: AC

## 2024-11-22 NOTE — Telephone Encounter (Signed)
 Patient is calling to say that his blood sugar levels are spiking and plummeting. On Monday, 11/20/2024 at 12:00 PM level was 190.  At 12:00 AN on 11/20/2024 is level was 64.  On Tuesday 11/21/2024 at 7:00 PM it was 286. At 9:00 PM on 11/21/2024 it was 67.  Today at 9:09 it is 187, but patient has not had anything except coffee with sugar free cream.

## 2024-11-22 NOTE — Telephone Encounter (Signed)
 Patient is currently using U500, 50 units in the morning and 50 in the evening,  He has also been on actos  for a week now and taking mounjaro  15 for a few months.  Some foods he has been eating include, eggs and grits and a tv dinner with steak and green beans.  He says he drinks water and unsweet tea.  Patient does not have any novolog .

## 2024-11-22 NOTE — Telephone Encounter (Signed)
 Can you please confirm with him the insulin  doses that he is using.  Per my last note: Please stop Tresiba  and NovoLog  and restart: - U500 40-50 units before brunch 20-30 units before snack in the evening               Increase: - Mounjaro  15 mg weekly as planned  Also, it would be helpful for me to see the herlene traces -is there any way he can drop his receiver off?  Let me know if not, we will try to adjust the regimen without these. Ty! C

## 2024-11-27 ENCOUNTER — Ambulatory Visit: Admitting: Internal Medicine

## 2024-11-27 ENCOUNTER — Encounter: Payer: Self-pay | Admitting: Internal Medicine

## 2024-11-27 ENCOUNTER — Telehealth: Payer: Self-pay | Admitting: Family Medicine

## 2024-11-27 ENCOUNTER — Other Ambulatory Visit: Payer: Self-pay | Admitting: Emergency Medicine

## 2024-11-27 VITALS — BP 120/70 | HR 73 | Ht 71.0 in | Wt 268.6 lb

## 2024-11-27 DIAGNOSIS — Z7984 Long term (current) use of oral hypoglycemic drugs: Secondary | ICD-10-CM | POA: Diagnosis not present

## 2024-11-27 DIAGNOSIS — H6993 Unspecified Eustachian tube disorder, bilateral: Secondary | ICD-10-CM

## 2024-11-27 DIAGNOSIS — E1159 Type 2 diabetes mellitus with other circulatory complications: Secondary | ICD-10-CM

## 2024-11-27 DIAGNOSIS — E1165 Type 2 diabetes mellitus with hyperglycemia: Secondary | ICD-10-CM

## 2024-11-27 DIAGNOSIS — J302 Other seasonal allergic rhinitis: Secondary | ICD-10-CM

## 2024-11-27 DIAGNOSIS — E66812 Obesity, class 2: Secondary | ICD-10-CM | POA: Diagnosis not present

## 2024-11-27 DIAGNOSIS — Z794 Long term (current) use of insulin: Secondary | ICD-10-CM | POA: Diagnosis not present

## 2024-11-27 DIAGNOSIS — E782 Mixed hyperlipidemia: Secondary | ICD-10-CM | POA: Diagnosis not present

## 2024-11-27 DIAGNOSIS — Z7985 Long-term (current) use of injectable non-insulin antidiabetic drugs: Secondary | ICD-10-CM | POA: Diagnosis not present

## 2024-11-27 NOTE — Telephone Encounter (Unsigned)
 Copied from CRM #[id number]. Topic: Clinical - Medication Refill >> Nov 27, 2024  9:52 AM Berneda FALCON wrote: Medication:  fluticasone  (FLONASE ) 50 MCG/ACT nasal spray  Has the patient contacted their pharmacy? Yes (Agent: If no, request that the patient contact the pharmacy for the refill. If patient does not wish to contact the pharmacy document the reason why and proceed with request.) (Agent: If yes, when and what did the pharmacy advise?)  This is the patient's preferred pharmacy:  [facility] [place], [place] - [address] [place] [address] Phone: [phone] Fax: [phone]  Is this the correct pharmacy for this prescription? Yes If no, delete pharmacy and type the correct one.   Has the prescription been filled recently? No  Is the patient out of the medication? Yes  Has the patient been seen for an appointment in the last year OR does the patient have an upcoming appointment? Yes  Can we respond through MyChart? Yes  Agent: Please be advised that Rx refills may take up to 3 business days. We ask that you follow-up with your pharmacy.
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
## 2024-11-27 NOTE — Patient Instructions (Addendum)
 Please use: - U500 45-50 units before brunch 30-35 units before dinner  - Novolog  5-10 units before meals   Continue:   - Actos  15 mg daily   - Mounjaro  15 mg weekly   Look up: - T:slim x2 - Omnipod - twiist  - iLet  Please return in 3 months.

## 2024-11-28 NOTE — Telephone Encounter (Signed)
 Patient called in to check  on the status of this medication being filled for him. Patient stated that he is starting to get sick,and needs this medication.

## 2024-11-29 DIAGNOSIS — F3160 Bipolar disorder, current episode mixed, unspecified: Secondary | ICD-10-CM | POA: Diagnosis not present

## 2024-12-06 ENCOUNTER — Ambulatory Visit: Admitting: Internal Medicine

## 2024-12-12 NOTE — Assessment & Plan Note (Signed)
 Blood pressure is back at goal. Continue olmesartan  10 mg daily and midodrine  2.5 mg tid. [name]

## 2024-12-12 NOTE — Assessment & Plan Note (Signed)
 Maximum weight: 325 lbs (04/2023) Current weight: 262 lbs Weight change since last visit: - 5 lbs Total weight loss: - 63 lbs (19.4%)  Mr. Fini has obesity with multiple serious co-morbidities. I will continue tirzepatide  (Mounjaro ) 15 mg weekly for diabetes management and weight loss.

## 2024-12-12 NOTE — Assessment & Plan Note (Signed)
 Continue U-500 and Tresiba  as managed by Dr. Trixie and tirzepatide  (Mounjaro ) to 15 mg weekly.
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
## 2024-12-13 ENCOUNTER — Encounter: Payer: Self-pay | Admitting: Family Medicine

## 2024-12-13 ENCOUNTER — Ambulatory Visit: Admitting: Family Medicine

## 2024-12-13 ENCOUNTER — Ambulatory Visit: Payer: Self-pay | Admitting: Family Medicine

## 2024-12-13 VITALS — BP 124/72 | HR 98 | Temp 98.2°F | Ht 71.0 in | Wt 262.4 lb

## 2024-12-13 DIAGNOSIS — Z6841 Body Mass Index (BMI) 40.0 and over, adult: Secondary | ICD-10-CM | POA: Diagnosis not present

## 2024-12-13 DIAGNOSIS — I10 Essential (primary) hypertension: Secondary | ICD-10-CM | POA: Diagnosis not present

## 2024-12-13 DIAGNOSIS — E1140 Type 2 diabetes mellitus with diabetic neuropathy, unspecified: Secondary | ICD-10-CM

## 2024-12-13 DIAGNOSIS — I951 Orthostatic hypotension: Secondary | ICD-10-CM | POA: Diagnosis not present

## 2024-12-13 DIAGNOSIS — Z7985 Long-term (current) use of injectable non-insulin antidiabetic drugs: Secondary | ICD-10-CM

## 2024-12-13 DIAGNOSIS — E782 Mixed hyperlipidemia: Secondary | ICD-10-CM | POA: Diagnosis not present

## 2024-12-13 DIAGNOSIS — I480 Paroxysmal atrial fibrillation: Secondary | ICD-10-CM

## 2024-12-13 DIAGNOSIS — Z794 Long term (current) use of insulin: Secondary | ICD-10-CM

## 2024-12-13 LAB — HEMOGLOBIN A1C: Hgb A1c MFr Bld: 6.9 % — ABNORMAL HIGH (ref 4.6–6.5)

## 2024-12-13 LAB — GLUCOSE, RANDOM: Glucose, Bld: 212 mg/dL — ABNORMAL HIGH (ref 70–99)

## 2024-12-13 NOTE — Assessment & Plan Note (Signed)
Lipids are at goal. Continue atorvastatin 10 mg daily.

## 2024-12-13 NOTE — Assessment & Plan Note (Signed)
 S/p cardiac ablation. Appears to be maintaining normal rhythm. Continue apixaban  5 mg bid for stroke prevention.

## 2024-12-13 NOTE — Assessment & Plan Note (Signed)
 Improved with reduction of antihypertensive and addition of midodrine  2.5 mg bid.
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
## 2024-12-15 ENCOUNTER — Other Ambulatory Visit

## 2024-12-15 ENCOUNTER — Ambulatory Visit: Admitting: Physician Assistant

## 2024-12-15 ENCOUNTER — Encounter: Payer: Self-pay | Admitting: Physician Assistant

## 2024-12-15 VITALS — BP 132/81 | HR 74 | Resp 20 | Ht 71.0 in | Wt 264.0 lb

## 2024-12-15 DIAGNOSIS — R413 Other amnesia: Secondary | ICD-10-CM

## 2024-12-15 DIAGNOSIS — R42 Dizziness and giddiness: Secondary | ICD-10-CM | POA: Diagnosis not present

## 2024-12-15 NOTE — Patient Instructions (Addendum)
 MRI brain  at [facility] [phone] Labs today B12 and thyroid  [address] at [facility] Neurocognitive testing here at [facility] Echocardiogram will be scheduled at [facility] Carotid ultrasound will be scheduled at [facility]

## 2024-12-16 LAB — VITAMIN B12: Vitamin B-12: 365 pg/mL (ref 200–1100)

## 2024-12-16 LAB — TSH: TSH: 0.82 m[IU]/L (ref 0.40–4.50)

## 2024-12-17 ENCOUNTER — Ambulatory Visit: Payer: Self-pay | Admitting: Physician Assistant

## 2024-12-19 ENCOUNTER — Ambulatory Visit (HOSPITAL_COMMUNITY)
Admission: RE | Admit: 2024-12-19 | Discharge: 2024-12-19 | Disposition: A | Discharge status: Home / Self Care | Source: Ambulatory Visit | Attending: Internal Medicine | Admitting: Internal Medicine

## 2024-12-19 ENCOUNTER — Other Ambulatory Visit: Payer: Self-pay | Admitting: Physician Assistant

## 2024-12-19 DIAGNOSIS — R42 Dizziness and giddiness: Secondary | ICD-10-CM | POA: Insufficient documentation

## 2024-12-19 DIAGNOSIS — R413 Other amnesia: Secondary | ICD-10-CM | POA: Diagnosis present

## 2024-12-19 DIAGNOSIS — E119 Type 2 diabetes mellitus without complications: Secondary | ICD-10-CM | POA: Insufficient documentation

## 2024-12-19 DIAGNOSIS — G4730 Sleep apnea, unspecified: Secondary | ICD-10-CM | POA: Insufficient documentation

## 2024-12-19 DIAGNOSIS — E785 Hyperlipidemia, unspecified: Secondary | ICD-10-CM | POA: Diagnosis not present

## 2024-12-19 DIAGNOSIS — I951 Orthostatic hypotension: Secondary | ICD-10-CM | POA: Insufficient documentation

## 2024-12-19 DIAGNOSIS — I77810 Thoracic aortic ectasia: Secondary | ICD-10-CM | POA: Insufficient documentation

## 2024-12-19 DIAGNOSIS — I2510 Atherosclerotic heart disease of native coronary artery without angina pectoris: Secondary | ICD-10-CM | POA: Insufficient documentation

## 2024-12-19 DIAGNOSIS — I4891 Unspecified atrial fibrillation: Secondary | ICD-10-CM | POA: Insufficient documentation

## 2024-12-19 LAB — ECHOCARDIOGRAM COMPLETE
Area-P 1/2: 3.66 cm2
S' Lateral: 2.95 cm

## 2024-12-19 MED ORDER — DIAZEPAM 2 MG PO TABS: ORAL_TABLET | ORAL | 0 refills | Status: AC

## 2024-12-19 NOTE — Telephone Encounter (Unsigned)
 Copied from CRM [id number]. Topic: Clinical - Medication Question >> Dec 19, 2024  4:44 PM Gary Grimes wrote: Reason for CRM: patient calling to get something sent in to the pharmacy below for sedation for his MRI.  [facility] - [place], [place] - [address] [place]  [address] Phone: [phone] Fax: [phone] Hours: Not open 24 hours

## 2024-12-26 ENCOUNTER — Ambulatory Visit
Admission: RE | Admit: 2024-12-26 | Discharge: 2024-12-26 | Disposition: A | Discharge status: Home / Self Care | Source: Ambulatory Visit | Attending: Physician Assistant | Admitting: Physician Assistant

## 2024-12-27 ENCOUNTER — Other Ambulatory Visit: Payer: Self-pay

## 2024-12-27 ENCOUNTER — Ambulatory Visit (HOSPITAL_COMMUNITY)

## 2024-12-27 ENCOUNTER — Ambulatory Visit
Admission: EM | Admit: 2024-12-27 | Discharge: 2024-12-27 | Disposition: A | Discharge status: Home / Self Care | Source: Home / Self Care

## 2024-12-27 ENCOUNTER — Ambulatory Visit: Payer: Self-pay

## 2024-12-27 DIAGNOSIS — R0989 Other specified symptoms and signs involving the circulatory and respiratory systems: Secondary | ICD-10-CM

## 2024-12-27 DIAGNOSIS — R051 Acute cough: Secondary | ICD-10-CM

## 2024-12-27 MED ORDER — PREDNISONE 20 MG PO TABS: 40.0000 mg | ORAL_TABLET | Freq: Every day | ORAL | 0 refills | Status: AC

## 2024-12-27 MED ORDER — ALBUTEROL SULFATE HFA 108 (90 BASE) MCG/ACT IN AERS: 1.0000 | INHALATION_SPRAY | Freq: Four times a day (QID) | RESPIRATORY_TRACT | 0 refills | Status: AC | PRN

## 2024-12-27 MED ORDER — IPRATROPIUM-ALBUTEROL 0.5-2.5 (3) MG/3ML IN SOLN
3.0000 mL | Freq: Once | RESPIRATORY_TRACT | Status: AC
  Administered 2024-12-27: 3 mL via RESPIRATORY_TRACT

## 2024-12-27 MED ORDER — ALBUTEROL SULFATE (2.5 MG/3ML) 0.083% IN NEBU: 2.5000 mg | INHALATION_SOLUTION | Freq: Four times a day (QID) | RESPIRATORY_TRACT | 0 refills | Status: AC | PRN

## 2024-12-27 MED ORDER — PROMETHAZINE-DM 6.25-15 MG/5ML PO SYRP: 5.0000 mL | ORAL_SOLUTION | Freq: Four times a day (QID) | ORAL | 0 refills | Status: AC | PRN

## 2024-12-27 NOTE — Telephone Encounter (Signed)
 FYI Only or Action Required?: FYI only for provider: Recommended to urgent care.  Patient was last seen in primary care on 12/13/2024 by Thedora Garnette HERO, MD.  Called Nurse Triage reporting Shortness of Breath.  Symptoms began several days ago.  Interventions attempted: Rest, hydration, or home remedies.  Symptoms are: unchanged.  Triage Disposition: See HCP Within 4 Hours (Or PCP Triage)  Patient/caregiver understands and will follow disposition?: Yes  Copied from CRM #[id number]. Topic: Clinical - Red Word Triage >> Dec 27, 2024  9:06 AM Eva FALCON wrote: Red Word that prompted transfer to Nurse Triage: think he has respiratory infection, shortness of breath, chest pain, cough, congestion. Reason for Disposition  [1] MILD difficulty breathing (e.g., minimal/no SOB at rest, SOB with walking, pulse < 100) AND [2] NEW-onset or WORSE than normal  Answer Assessment - Initial Assessment Questions Patient reports severe URI with shortness of breath, productive cough, congestion, chest tightness and runny nose. Patient is wanting to be seen in the office today. No availability in PCP office or in the service area. Patient is recommended to urgent care to be evaluated.   1. RESPIRATORY STATUS: Describe your breathing? (e.g., wheezing, shortness of breath, unable to speak, severe coughing)      Shortness of breath 2. ONSET: When did this breathing problem begin?      Started over the weekend 3. PATTERN Does the difficult breathing come and go, or has it been constant since it started?      constant 4. SEVERITY: How bad is your breathing? (e.g., mild, moderate, severe)      mild 5. RECURRENT SYMPTOM: Have you had difficulty breathing before? If Yes, ask: When was the last time? and What happened that time?      Yes-October 6. CARDIAC HISTORY: Do you have any history of heart disease? (e.g., heart attack, angina, bypass surgery, angioplasty)      no 7. LUNG HISTORY: Do you have  any history of lung disease?  (e.g., pulmonary embolus, asthma, emphysema)     no 8. CAUSE: What do you think is causing the breathing problem?      URI 9. OTHER SYMPTOMS: Do you have any other symptoms? (e.g., chest pain, cough, dizziness, fever, runny nose)     Cough, chest tightness, congestion, runny nose 10. O2 SATURATION MONITOR:  Do you use an oxygen saturation monitor (pulse oximeter) at home? If Yes, ask: What is your reading (oxygen level) today? What is your usual oxygen saturation reading? (e.g., 95%)       NA 12. TRAVEL: Have you traveled out of the country in the last month? (e.g., travel history, exposures)       no  Protocols used: Breathing Difficulty-A-AH

## 2024-12-27 NOTE — ED Triage Notes (Signed)
 Pt presents with c/o productive cough, nasal congestion, and little SOB. This is day five of symptoms. No pain. Only voices discomfort in chest/throat. OTC Dayquil + Nyquil taken for symptoms at home. Was around sick child about six days ago. No fevers. States his mucus is now green.

## 2024-12-27 NOTE — Telephone Encounter (Signed)
 Noted. Patient advised to go to urgent care.   I called patient and left a detailed message to check on patient and to see if he went to urgent care to be seen.
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
## 2025-01-01 ENCOUNTER — Other Ambulatory Visit: Payer: Self-pay | Admitting: Pulmonary Disease

## 2025-01-01 ENCOUNTER — Ambulatory Visit: Payer: Self-pay

## 2025-01-01 NOTE — Telephone Encounter (Signed)
 FYI Only or Action Required?: FYI only for provider: appointment scheduled on 01/03/25, added to wait list.  Patient was last seen in primary care on 12/13/2024 by Thedora Garnette HERO, MD.  Called Nurse Triage reporting Cough.  Symptoms began 10 days ago.  Interventions attempted: Prescription medications: prednisone , albuterol  and Rest, hydration, or home remedies.  Symptoms are: gradually improving.  Triage Disposition: See PCP When Office is Open (Within 3 Days)  Patient/caregiver understands and will follow disposition?: Yes  Reason for Disposition  [1] Nasal discharge AND [2] present > 10 days  Answer Assessment - Initial Assessment Questions Additional info:  Patient was evaluated at [facility] on 12/27/24 for several days of cough. He was prescribed inhalers, prednisone  which he finished. Calling today to scheduled follow up appointment with pcp for persistent cough with only minimal improvement on prednisone . Denies chest pain and neurological symptoms, he does experience intermittent shortness of breath that is relieved with inhaler a prescribed.  Scheduled next available appointment  with pcp on 01/03/25, added appointment to wait list.    1. ONSET: When did the cough begin?      12/27/24 2. SEVERITY: How bad is the cough today?      moderate 3. SPUTUM: Describe the color of your sputum (e.g., none, dry cough; clear, white, yellow, green)     green 4. HEMOPTYSIS: Are you coughing up any blood? If Yes, ask: How much? (e.g., flecks, streaks, tablespoons, etc.)     denies 5. DIFFICULTY BREATHING: Are you having difficulty breathing? If Yes, ask: How bad is it? (e.g., mild, moderate, severe)      Shortness of breath-inhalers effective 6. FEVER: Do you have a fever? If Yes, ask: What is your temperature, how was it measured, and when did it start?     Denies 99.4 7. CARDIAC HISTORY: Do you have any history of heart disease? (e.g., heart attack, congestive heart  failure)       8. LUNG HISTORY: Do you have any history of lung disease?  (e.g., pulmonary embolus, asthma, emphysema)     osa 9. PE RISK FACTORS: Do you have a history of blood clots? (or: recent major surgery, recent prolonged travel, bedridden)      10. OTHER SYMPTOMS: Do you have any other symptoms? (e.g., runny nose, wheezing, chest pain)       Mild intermittent headache, blood shot eyes 11. PREGNANCY: Is there any chance you are pregnant? When was your last menstrual period?        12. TRAVEL: Have you traveled out of the country in the last month? (e.g., travel history, exposures)  Protocols used: Cough - Acute Productive-A-AH  Copied from CRM #[id number]. Topic: Clinical - Red Word Triage >> Jan 01, 2025  9:11 AM Viola F wrote: Patient was seen 12/27/2024 at [facility]) - he's still having bad cough and shortness of breath

## 2025-01-01 NOTE — Telephone Encounter (Signed)
 Noted. Dm/cma

## 2025-01-01 NOTE — Telephone Encounter (Signed)
 Copied from CRM [id number]. Topic: Clinical - Medication Refill >> Jan 01, 2025 11:35 AM Rozanna MATSU wrote: Medication: triazolam  (HALCION ) 0.25 MG tablet  Has the patient contacted their pharmacy? Yes (Agent: If no, request that the patient contact the pharmacy for the refill. If patient does not wish to contact the pharmacy document the reason why and proceed with request.) (Agent: If yes, when and what did the pharmacy advise?)  This is the patient's preferred pharmacy:  [facility] [place], [place] - [address] [place] [address] Phone: [phone] Fax: [phone]  Is this the correct pharmacy for this prescription? Yes If no, delete pharmacy and type the correct one.   Has the prescription been filled recently? Yes  Is the patient out of the medication? Yes  Has the patient been seen for an appointment in the last year OR does the patient have an upcoming appointment? Yes  Can we respond through MyChart? Yes  Agent: Please be advised that Rx refills may take up to 3 business days. We ask that you follow-up with your pharmacy.

## 2025-01-03 ENCOUNTER — Ambulatory Visit (HOSPITAL_COMMUNITY): Admit: 2025-01-03

## 2025-01-03 ENCOUNTER — Encounter: Payer: Self-pay | Admitting: Family Medicine

## 2025-01-03 ENCOUNTER — Encounter: Payer: Self-pay | Admitting: Physician Assistant

## 2025-01-03 ENCOUNTER — Ambulatory Visit: Admitting: Family Medicine

## 2025-01-03 VITALS — BP 140/82 | HR 73 | Temp 97.7°F | Ht 71.0 in

## 2025-01-03 DIAGNOSIS — J40 Bronchitis, not specified as acute or chronic: Secondary | ICD-10-CM | POA: Diagnosis not present

## 2025-01-03 MED ORDER — HYDROCODONE BIT-HOMATROP MBR 5-1.5 MG/5ML PO SOLN: 5.0000 mL | Freq: Three times a day (TID) | ORAL | 0 refills | Status: AC | PRN

## 2025-01-03 MED ORDER — BENZONATATE 200 MG PO CAPS: 200.0000 mg | ORAL_CAPSULE | Freq: Two times a day (BID) | ORAL | 1 refills | Status: AC | PRN

## 2025-01-03 NOTE — Assessment & Plan Note (Signed)
 Discussed home care for viral illness, including rest, pushing fluids, and OTC medications as needed for symptom relief. Discussed the natural history of bronchitis and the lack of benefit for most patients with antibiotic use. I will prescribed Tessalon  for daytime cough and hydrocodone -homatropine syrup for nighttime cough suppression.  Follow-up if needed for worsening or persistent symptoms.
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
## 2025-01-05 NOTE — Progress Notes (Signed)
 No answer at 4:06pm 01/05/2025, prefers a call not a my chart message, try back monday

## 2025-01-08 NOTE — Progress Notes (Signed)
 I advised of MRI etc, Patient verbally understood results and thanked me for calling.

## 2025-01-10 ENCOUNTER — Ambulatory Visit (HOSPITAL_COMMUNITY)
Admission: RE | Admit: 2025-01-10 | Discharge: 2025-01-10 | Disposition: A | Discharge status: Home / Self Care | Source: Ambulatory Visit | Attending: Physician Assistant | Admitting: Physician Assistant

## 2025-01-10 DIAGNOSIS — R42 Dizziness and giddiness: Secondary | ICD-10-CM | POA: Diagnosis not present

## 2025-01-10 DIAGNOSIS — R413 Other amnesia: Secondary | ICD-10-CM | POA: Insufficient documentation

## 2025-01-11 ENCOUNTER — Ambulatory Visit: Admitting: Internal Medicine

## 2025-01-11 ENCOUNTER — Encounter: Payer: Self-pay | Admitting: Internal Medicine

## 2025-01-11 ENCOUNTER — Telehealth: Payer: Self-pay | Admitting: Physician Assistant

## 2025-01-11 VITALS — BP 122/70 | HR 73 | Ht 71.0 in | Wt 270.0 lb

## 2025-01-11 DIAGNOSIS — E66812 Obesity, class 2: Secondary | ICD-10-CM

## 2025-01-11 DIAGNOSIS — E1159 Type 2 diabetes mellitus with other circulatory complications: Secondary | ICD-10-CM

## 2025-01-11 DIAGNOSIS — E1165 Type 2 diabetes mellitus with hyperglycemia: Secondary | ICD-10-CM

## 2025-01-11 DIAGNOSIS — Z794 Long term (current) use of insulin: Secondary | ICD-10-CM | POA: Diagnosis not present

## 2025-01-11 DIAGNOSIS — E782 Mixed hyperlipidemia: Secondary | ICD-10-CM
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
## 2025-01-11 NOTE — Patient Instructions (Addendum)
 Please continue: - U500 45-50 units before brunch 30-35 units before dinner  - Novolog  5-10 units before meals   - Actos  15 mg daily   - Mounjaro  15 mg weekly  Please return in 3 months.

## 2025-01-11 NOTE — Telephone Encounter (Signed)
 Pt called in this afternoon and he stated that he receive the results from the Carotid Artery he had done. Pt did not understand the results so he wants a call back to discuss the results. Thanks

## 2025-01-12 ENCOUNTER — Ambulatory Visit: Admitting: Family Medicine

## 2025-01-15 ENCOUNTER — Ambulatory Visit: Payer: Self-pay

## 2025-01-15 ENCOUNTER — Institutional Professional Consult (permissible substitution): Payer: Self-pay | Admitting: Psychology

## 2025-01-17 ENCOUNTER — Ambulatory Visit: Admitting: Pulmonary Disease

## 2025-01-17 ENCOUNTER — Encounter: Payer: Self-pay | Admitting: Pulmonary Disease

## 2025-01-17 VITALS — BP 130/68 | HR 91 | Temp 97.9°F | Ht 71.0 in | Wt 267.0 lb

## 2025-01-17 DIAGNOSIS — F5104 Psychophysiologic insomnia: Secondary | ICD-10-CM

## 2025-01-17 DIAGNOSIS — F32A Depression, unspecified: Secondary | ICD-10-CM

## 2025-01-17 DIAGNOSIS — G4733 Obstructive sleep apnea (adult) (pediatric): Secondary | ICD-10-CM

## 2025-01-17 DIAGNOSIS — Z87891 Personal history of nicotine dependence: Secondary | ICD-10-CM

## 2025-01-17 NOTE — Patient Instructions (Addendum)
 We will order a home sleep test to ascertain whether you still have significant sleep apnea following a significant weight loss  Order WatchPAT study  Continue using your medications help you sleep better-you do have refills in place already, let us  know when you are out  Call us  with significant concerns  Tentative follow-up in about 3 months  We will update you will results regarding findings on the home sleep test
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
## 2025-01-19 ENCOUNTER — Encounter: Admitting: Physician Assistant

## 2025-01-23 ENCOUNTER — Encounter: Payer: Self-pay | Admitting: Psychology

## 2025-01-30 ENCOUNTER — Institutional Professional Consult (permissible substitution): Admitting: Psychology

## 2025-01-30 ENCOUNTER — Ambulatory Visit

## 2025-02-01 ENCOUNTER — Telehealth: Payer: Self-pay | Admitting: Cardiology

## 2025-02-01 NOTE — Telephone Encounter (Signed)
" °*  STAT* If patient is at the pharmacy, call can be transferred to refill team.   1. Which medications need to be refilled? (please list name of each medication and dose if known)   apixaban  (ELIQUIS ) 5 MG TABS tablet   2. Would you like to learn more about the convenience, safety, & potential cost savings by using the [facility]?   3. Are you open to using the [facility] (Type [facility]. ).  4. Which pharmacy/location (including street and city if local pharmacy) is medication to be sent to?  [facility] - [place], [place] - [address]   5. Do they need a 30 day or 90 day supply?   90 day  Patient stated he has 2 days left of this medication.  Patient has appointment scheduled with Dr. Edwyna on 3/25. "

## 2025-02-01 NOTE — Telephone Encounter (Signed)
 Prescription refill request for Eliquis  received. Indication: AFIB Last office visit: 6/25 Scr:  1.17 Age: 64 Weight: 121.1KG

## 2025-02-06 ENCOUNTER — Encounter: Payer: Self-pay | Admitting: Psychology

## 2025-02-08 ENCOUNTER — Encounter

## 2025-02-22 ENCOUNTER — Institutional Professional Consult (permissible substitution): Admitting: Psychology

## 2025-02-22 ENCOUNTER — Ambulatory Visit

## 2025-02-28 ENCOUNTER — Encounter: Admitting: Psychology

## 2025-03-15 ENCOUNTER — Ambulatory Visit: Payer: Self-pay | Admitting: Physician Assistant

## 2025-03-21 ENCOUNTER — Ambulatory Visit: Admitting: Cardiology

## 2025-04-11 ENCOUNTER — Ambulatory Visit: Admitting: Internal Medicine

## 2025-04-30 ENCOUNTER — Ambulatory Visit: Admitting: Pulmonary Disease
